# Patient Record
Sex: Female | Born: 1955 | Race: White | Hispanic: No | Marital: Married
Health system: Midwestern US, Community
[De-identification: ages and names within clinical notes are randomized; demographics above are authoritative.]

## PROBLEM LIST (undated history)

## (undated) DIAGNOSIS — K821 Hydrops of gallbladder: Principal | ICD-10-CM

## (undated) DIAGNOSIS — M7989 Other specified soft tissue disorders: Secondary | ICD-10-CM

## (undated) DIAGNOSIS — Z5181 Encounter for therapeutic drug level monitoring: Secondary | ICD-10-CM

## (undated) DIAGNOSIS — M4802 Spinal stenosis, cervical region: Secondary | ICD-10-CM

## (undated) DIAGNOSIS — G8929 Other chronic pain: Secondary | ICD-10-CM

## (undated) DIAGNOSIS — I10 Essential (primary) hypertension: Secondary | ICD-10-CM

## (undated) DIAGNOSIS — F22 Delusional disorders: Secondary | ICD-10-CM

## (undated) DIAGNOSIS — R928 Other abnormal and inconclusive findings on diagnostic imaging of breast: Secondary | ICD-10-CM

## (undated) DIAGNOSIS — R748 Abnormal levels of other serum enzymes: Secondary | ICD-10-CM

## (undated) DIAGNOSIS — R7989 Other specified abnormal findings of blood chemistry: Secondary | ICD-10-CM

## (undated) DIAGNOSIS — M2550 Pain in unspecified joint: Secondary | ICD-10-CM

## (undated) DIAGNOSIS — K811 Chronic cholecystitis: Secondary | ICD-10-CM

## (undated) DIAGNOSIS — M47816 Spondylosis without myelopathy or radiculopathy, lumbar region: Secondary | ICD-10-CM

## (undated) DIAGNOSIS — L989 Disorder of the skin and subcutaneous tissue, unspecified: Secondary | ICD-10-CM

## (undated) DIAGNOSIS — R609 Edema, unspecified: Secondary | ICD-10-CM

## (undated) DIAGNOSIS — M961 Postlaminectomy syndrome, not elsewhere classified: Secondary | ICD-10-CM

## (undated) DIAGNOSIS — K2970 Gastritis, unspecified, without bleeding: Secondary | ICD-10-CM

## (undated) DIAGNOSIS — B351 Tinea unguium: Secondary | ICD-10-CM

## (undated) DIAGNOSIS — K219 Gastro-esophageal reflux disease without esophagitis: Secondary | ICD-10-CM

## (undated) DIAGNOSIS — M4712 Other spondylosis with myelopathy, cervical region: Secondary | ICD-10-CM

## (undated) DIAGNOSIS — M797 Fibromyalgia: Secondary | ICD-10-CM

## (undated) DIAGNOSIS — F419 Anxiety disorder, unspecified: Secondary | ICD-10-CM

## (undated) DIAGNOSIS — E039 Hypothyroidism, unspecified: Secondary | ICD-10-CM

## (undated) DIAGNOSIS — F32A Depression, unspecified: Secondary | ICD-10-CM

## (undated) DIAGNOSIS — M5136 Other intervertebral disc degeneration, lumbar region: Secondary | ICD-10-CM

## (undated) DIAGNOSIS — M533 Sacrococcygeal disorders, not elsewhere classified: Secondary | ICD-10-CM

## (undated) DIAGNOSIS — M1990 Unspecified osteoarthritis, unspecified site: Secondary | ICD-10-CM

## (undated) DIAGNOSIS — M5020 Other cervical disc displacement, unspecified cervical region: Secondary | ICD-10-CM

## (undated) DIAGNOSIS — D649 Anemia, unspecified: Secondary | ICD-10-CM

## (undated) DIAGNOSIS — M47817 Spondylosis without myelopathy or radiculopathy, lumbosacral region: Secondary | ICD-10-CM

## (undated) DIAGNOSIS — M4716 Other spondylosis with myelopathy, lumbar region: Secondary | ICD-10-CM

## (undated) DIAGNOSIS — G4730 Sleep apnea, unspecified: Secondary | ICD-10-CM

## (undated) DIAGNOSIS — R011 Cardiac murmur, unspecified: Secondary | ICD-10-CM

## (undated) DIAGNOSIS — M5417 Radiculopathy, lumbosacral region: Secondary | ICD-10-CM

## (undated) DIAGNOSIS — Z79899 Other long term (current) drug therapy: Secondary | ICD-10-CM

## (undated) DIAGNOSIS — M810 Age-related osteoporosis without current pathological fracture: Secondary | ICD-10-CM

## (undated) DIAGNOSIS — E559 Vitamin D deficiency, unspecified: Secondary | ICD-10-CM

## (undated) DIAGNOSIS — R0600 Dyspnea, unspecified: Secondary | ICD-10-CM

## (undated) DIAGNOSIS — M51369 Other intervertebral disc degeneration, lumbar region without mention of lumbar back pain or lower extremity pain: Secondary | ICD-10-CM

## (undated) DIAGNOSIS — G63 Polyneuropathy in diseases classified elsewhere: Secondary | ICD-10-CM

## (undated) DIAGNOSIS — M4310 Spondylolisthesis, site unspecified: Secondary | ICD-10-CM

## (undated) DIAGNOSIS — F329 Major depressive disorder, single episode, unspecified: Secondary | ICD-10-CM

## (undated) HISTORY — DX: Depression, unspecified: F32.A

## (undated) HISTORY — DX: Age-related osteoporosis without current pathological fracture: M81.0

## (undated) HISTORY — PX: TONSILLECTOMY: SUR1361

## (undated) HISTORY — PX: APPENDECTOMY: SHX54

## (undated) HISTORY — DX: Unspecified osteoarthritis, unspecified site: M19.90

## (undated) HISTORY — DX: Essential (primary) hypertension: I10

## (undated) HISTORY — DX: Anxiety disorder, unspecified: F41.9

## (undated) HISTORY — PX: CHOLECYSTECTOMY: SHX55

## (undated) HISTORY — PX: TEMPORAL ARTERY BIOPSY / LIGATION: SUR132

## (undated) HISTORY — DX: Fibromyalgia: M79.7

## (undated) HISTORY — DX: Major depressive disorder, single episode, unspecified: F32.9

## (undated) MED ORDER — VENLAFAXINE 37.5 MG TAB
37.5 mg | ORAL_TABLET | ORAL | Status: DC
Start: ? — End: 2013-10-20

## (undated) MED ORDER — FUROSEMIDE 20 MG TAB
20 mg | ORAL_TABLET | ORAL | Status: DC
Start: ? — End: 2012-10-01

## (undated) MED ORDER — NEXIUM 40 MG CAPSULE,DELAYED RELEASE
40 mg | ORAL_CAPSULE | ORAL | Status: DC
Start: ? — End: 2013-12-18

## (undated) MED ORDER — TRAZODONE 100 MG TAB
100 mg | ORAL_TABLET | Freq: Every evening | ORAL | Status: DC
Start: ? — End: 2012-07-26

## (undated) MED ORDER — FUROSEMIDE 20 MG TAB
20 mg | ORAL_TABLET | Freq: Two times a day (BID) | ORAL | Status: DC
Start: ? — End: 2013-04-23

## (undated) MED ORDER — PREGABALIN 100 MG CAP
100 mg | ORAL_CAPSULE | ORAL | Status: DC
Start: ? — End: 2014-06-26

## (undated) MED ORDER — TRAZODONE 100 MG TAB
100 mg | ORAL_TABLET | Freq: Every evening | ORAL | Status: DC
Start: ? — End: 2012-05-11

## (undated) MED ORDER — VENLAFAXINE 37.5 MG TAB
37.5 mg | ORAL_TABLET | Freq: Two times a day (BID) | ORAL | Status: DC
Start: ? — End: 2013-04-26

## (undated) MED ORDER — FLUTICASONE 50 MCG/ACTUATION NASAL SPRAY, SUSP
50 mcg/actuation | Freq: Every day | NASAL | Status: DC
Start: ? — End: 2013-10-22

## (undated) MED ORDER — FUROSEMIDE 20 MG TAB
20 mg | ORAL_TABLET | ORAL | Status: DC
Start: ? — End: 2012-11-30

## (undated) MED ORDER — FUROSEMIDE 20 MG TAB
20 mg | ORAL_TABLET | Freq: Two times a day (BID) | ORAL | Status: DC
Start: ? — End: 2012-08-03

## (undated) MED ORDER — VENLAFAXINE 37.5 MG TAB
37.5 mg | ORAL_TABLET | ORAL | Status: DC
Start: ? — End: 2013-05-24

## (undated) MED ORDER — AMOXICILLIN-CLAVULANATE 500 MG-125 MG TAB
500-125 mg | ORAL_TABLET | Freq: Two times a day (BID) | ORAL | Status: AC
Start: ? — End: 2012-02-24

## (undated) MED ORDER — KETOCONAZOLE 2 % TOPICAL CREAM
2 % | CUTANEOUS | Status: DC
Start: ? — End: 2012-08-05

## (undated) MED ORDER — TRIMETHOPRIM-SULFAMETHOXAZOLE 160 MG-800 MG TAB
160-800 mg | ORAL_TABLET | Freq: Two times a day (BID) | ORAL | Status: AC
Start: ? — End: 2012-06-09

## (undated) MED ORDER — FUROSEMIDE 20 MG TAB
20 mg | ORAL_TABLET | Freq: Two times a day (BID) | ORAL | Status: DC
Start: ? — End: 2012-09-02

## (undated) MED ORDER — POTASSIUM CHLORIDE SR 8 MEQ TAB
8 mEq | ORAL_TABLET | Freq: Two times a day (BID) | ORAL | Status: DC
Start: ? — End: 2014-02-01

## (undated) MED ORDER — FUROSEMIDE 20 MG TAB
20 mg | ORAL_TABLET | ORAL | Status: DC
Start: ? — End: 2013-06-24

## (undated) MED ORDER — NEXIUM 40 MG CAPSULE,DELAYED RELEASE
40 mg | ORAL_CAPSULE | ORAL | Status: DC
Start: ? — End: 2013-05-24

## (undated) MED ORDER — ESOMEPRAZOLE MAGNESIUM 40 MG CAP, DELAYED RELEASE
40 mg | ORAL_CAPSULE | Freq: Every day | ORAL | Status: DC
Start: ? — End: 2013-04-26

## (undated) MED ORDER — FUROSEMIDE 20 MG TAB
20 mg | ORAL_TABLET | ORAL | Status: DC
Start: ? — End: 2012-10-31

---

## 1998-04-28 HISTORY — PX: ABDOMINAL HYSTERECTOMY: SHX81

## 2000-04-28 HISTORY — PX: OTHER SURGICAL HISTORY: SHX169

## 2002-04-28 HISTORY — PX: GASTRIC BYPASS: SHX52

## 2003-04-29 HISTORY — PX: BREAST BIOPSY: SHX20

## 2003-04-29 HISTORY — PX: JOINT REPLACEMENT: SHX530

## 2006-04-28 HISTORY — PX: SHOULDER ARTHROSCOPY WITH ROTATOR CUFF REPAIR: SHX5685

## 2006-12-14 NOTE — Op Note (Signed)
[facility]                                OPERATION REPORT                         SURGEON:  Phil Dopp, M.D.   [facility] Gavin Pound, Jackqueline   E:   MR  [id number]                DATE OF SURGERY:                     12/14/2006   #:   [place] Magnus  [id number]             PT. LOCATION:                        OR  OR23   #   Phil Dopp, M.D.        DOB: 1955/06/29        AGE:51        SEX:  F   cc:    Phil Dopp, M.D.   cc:  [facility]          [address]          [place], [place] [address]   PREOPERATIVE DIAGNOSIS:   Recurrent tear right rotator cuff.   POSTOPERATIVE DIAGNOSIS:   Large retracted recurrent tear rotator cuff.   TITLE OF PROCEDURES:      1. Exploration repair of right rotator cuff tear.      2. Release of coracoacromial ligament/acromioplasty.      3. Distal clavicle resection.   SURGEON:   Hillary Bow, M.D.   ASSISTANT:   Doyce Para.   ANESTHESIA:   Regional, Dr. San Morelle, [place].   PROCEDURE NOTE:  Past history:  The patient is a 51 year old female having   had 2 previous right rotator cuff repair attempts done in [place]   over the last 4 to 5 years.   The patient now comes desiring repair of a recurrent tear proven by MRI   scan.   DESCRIPTION OF PROCEDURE:  The patient was taken to the operating room and   after the induction of general anesthesia was placed in a semi-sitting   barber-chair position, with the right shoulder prepped and draped in a   routine fashion for shoulder procedure.   It should be noted that there was a previous scar over the anterior lateral   corner of the shoulder.  The skin incision was made straight superior in   saber cut fashion however just adjacent to the [facility]- joint.  The subcutaneous   tissue was divided down to the fascia overlying the deltoid.  The deltoid   was taken down off the anterior acromion in a straight horizontal fashion.   The [facility]- joint was dissected and the distal clavicle found to be    hypertrophic and degenerative.  Then, 4-mm to 6-mm of bone was resected   with sagittal saw.  The anterior acromion was dissected and revision of   acromioplasty was carried out removing the anteroinferior surface.  This   was made smooth with a rasp.  The coracoacromial ligament was identified   and released.   The subacromial space was then explored and indeed a large massive rotator   cuff tear  was found which was torn in at least 2 segments and retracted   distally.   It should be noted that this was the third procedure done on this patient's   shoulder which complicated the situations scar tissue and retraction of the   previously repaired tendon, made the procedure much more difficult.  This   required extensive effort intones to dissect the tendon free and mobilize   this out to the anterolateral corner.   Once the tendon was dissected free the far posterior corner was then   repaired in a side-to-side fashion using #2 FiberWire.  This closed a major   portion of the tear and allowed mobilization out to the anterior tuberosity   region.  Once this was accomplished a single Arthrex Bio-corkscrew suture   anchor was placed in the greater tuberosity.  This contained 4 sutures   which were brought through the tendon, attached securely and then brought   back through bone in a vest-over-pants double row type repair.  This   essentially produced a watertight closure with some deficit at the extreme   corner of the repair.  This was made watertight with recession of the   tendon however approximately 0.50 cm.   Once this was completed, the deltoid was then repaired back to the anterior   and lateral acromion with #1 Ethibond.  It should be noted that a Hemovac   drain was placed in the subacromial space prior to deltoid closure.  The   [facility]- joint was likewise closed with anterior deltoid fascia and posterior   trapezius fascia.  This was oversewed with 0 Vicryl and a watertight    closure obtained.  The ___ tissue was closed with 2-0 and 3-0 Vicryl.  The   skin was closed with 3-0 Prolene subcuticular.  Sterile dressings were   placed.   The patient was then placed in a sitting position with abduction orthosis   applied with the shoulder in 45 degrees of abduction, 10 degrees of forward   flexion, and 10 degrees of internal rotation.  The patient was transferred   to the recovery room in stable condition.  There were no complications.   Estimated blood loss was 15cc to 20 cc.  All sponge and instrument counts   were correct.   Electronically Signed By:   Phil Dopp, M.D. 01/01/2007 16:40   _________________________________   Phil Dopp, M.D.   TS1  D:  12/14/2006  T:  12/14/2006  3:16 P   [id number]

## 2006-12-14 NOTE — Op Note (Signed)
[facility]                                OPERATION REPORT                         SURGEON:  Bridget Morton, M.D.   [facility] Bridget Morton, Bridget Morton   E:   MR  [id number]                DATE OF SURGERY:                     12/14/2006   #:   Bridget Morton  [id number]             PT. LOCATION:                        OR  OR23   #   Bridget Morton, M.D.        DOB: 1955-10-12        AGE:51        SEX:  F   cc:    Bridget Morton, M.D.   cc:  [facility]          [address]          [place], [place] [address]   PREOPERATIVE DIAGNOSIS:   Recurrent tear right rotator cuff.   POSTOPERATIVE DIAGNOSIS:   Large retracted recurrent tear rotator cuff.   TITLE OF PROCEDURES:      1. Exploration repair of right rotator cuff tear.      2. Release of coracoacromial ligament/acromioplasty.      3. Distal clavicle resection.   SURGEON:   Hillary Bow, M.D.   ASSISTANT:   Doyce Para.   ANESTHESIA:   Regional, Dr. San Morelle, [place].   PROCEDURE NOTE:  Past history:  The patient is a 51 year old female having   had 2 previous right rotator cuff repair attempts done in [place]   over the last 4 to 5 years.   The patient now comes desiring repair of a recurrent tear proven by MRI   scan.   DESCRIPTION OF PROCEDURE:  The patient was taken to the operating room and   after the induction of general anesthesia was placed in a semi-sitting   barber-chair position, with the right shoulder prepped and draped in a   routine fashion for shoulder procedure.   It should be noted that there was a previous scar over the anterior lateral   corner of the shoulder.  The skin incision was made straight superior in   saber cut fashion however just adjacent to the [facility]- joint.  The subcutaneous   tissue was divided down to the fascia overlying the deltoid.  The deltoid   was taken down off the anterior acromion in a straight horizontal fashion.   The [facility]- joint was dissected and the distal clavicle found to be   hypertrophic  and degenerative.  Then, 4-mm to 6-mm of bone was resected   with sagittal saw.  The anterior acromion was dissected and revision of   acromioplasty was carried out removing the anteroinferior surface.  This   was made smooth with a rasp.  The coracoacromial ligament was identified   and released.   The subacromial space was then explored and indeed a large massive rotator   cuff tear  was found which was torn in at least 2 segments and retracted   distally.   It should be noted that this was the third procedure done on this patient's   shoulder which complicated the situations scar tissue and retraction of the   previously repaired tendon, made the procedure much more difficult.  This   required extensive effort intones to dissect the tendon free and mobilize   this out to the anterolateral corner.   Once the tendon was dissected free the far posterior corner was then   repaired in a side-to-side fashion using #2 FiberWire.  This closed a major   portion of the tear and allowed mobilization out to the anterior tuberosity   region.  Once this was accomplished a single Arthrex Bio-corkscrew suture   anchor was placed in the greater tuberosity.  This contained 4 sutures   which were brought through the tendon, attached securely and then brought   back through bone in a vest-over-pants double row type repair.  This   essentially produced a watertight closure with some deficit at the extreme   corner of the repair.  This was made watertight with recession of the   tendon however approximately 0.50 cm.   Once this was completed, the deltoid was then repaired back to the anterior   and lateral acromion with #1 Ethibond.  It should be noted that a Hemovac   drain was placed in the subacromial space prior to deltoid closure.  The   [facility]- joint was likewise closed with anterior deltoid fascia and posterior   trapezius fascia.  This was oversewed with 0 Vicryl and a watertight   closure obtained.  The ___ tissue was closed with  2-0 and 3-0 Vicryl.  The   skin was closed with 3-0 Prolene subcuticular.  Sterile dressings were   placed.   The patient was then placed in a sitting position with abduction orthosis   applied with the shoulder in 45 degrees of abduction, 10 degrees of forward   flexion, and 10 degrees of internal rotation.  The patient was transferred   to the recovery room in stable condition.  There were no complications.   Estimated blood loss was 15cc to 20 cc.  All sponge and instrument counts   were correct.   Electronically Signed By:   Bridget Morton, M.D. 01/01/2007 16:40   _________________________________   Bridget Morton, M.D.   TS1  D:  12/14/2006  T:  12/14/2006  3:16 P   [id number]

## 2008-08-23 NOTE — Procedures (Signed)
Test Reason : Pre-Op   Blood Pressure : ***/*** mmHG   Vent. Rate : 084 BPM     Atrial Rate : 084 BPM   P-R Int : 140 ms          QRS Dur : 074 ms   QT Int : 352 ms       P-R-T Axes : 031 053 017 degrees   QTc Int : 415 ms   Normal sinus rhythm   Normal ECG   When compared with ECG of 09-Dec-2006 15:03,   No significant change was found   Confirmed by Robertson, M.D., Scott ([id number]) on 08/23/2008 4:35:38 PM   Referred By:  MAUSER, THOMAS L           Overread By: Scott Robertson, M.D.

## 2008-08-23 NOTE — Procedures (Signed)
Test Reason : Pre-Op   Blood Pressure : ***/*** mmHG   Vent. Rate : 084 BPM     Atrial Rate : 084 BPM   P-R Int : 140 ms          QRS Dur : 074 ms   QT Int : 352 ms       P-R-T Axes : 031 053 017 degrees   QTc Int : 415 ms   Normal sinus rhythm   Normal ECG   When compared with ECG of 09-Dec-2006 15:03,   No significant change was found   Confirmed by Merilynn Finland, M.D., Scott ([id number]) on 08/23/2008 4:35:38 PM   Referred By:  Blenda Bridegroom           Overread By: Marcello Moores, M.D.

## 2010-03-18 NOTE — Progress Notes (Signed)
[facility]   Physical Therapy   PATIENT NAME:  Bridget Morton, Bridget Morton   MR#    [id number]   DATE OF BIRTH: 06-05-1955   DATE: 03/13/2010   LOCATION:    [facility]  [id number]   REF. PHYSICIAN: Marylynn Pearson               REFERRING PHYSICIAN:    Dr. Marylynn Pearson       DATABASE:   The patient is a 54 year old female who was referred to physical therapy with    a diagnosis of postural arthritis 1 to 3 visits for completion of her    disability form.       SUBJECTIVE:       HISTORY OF PRESENT ILLNESS:   The patient reported longstanding history of osteoarthritis.  She had    bilateral knee replacement, 3 rotator cuff repairs and spinal disk fusion.     She has pain and weakness in her legs and upper arm.  She had difficulty    walking.       MEDICATIONS:   Gabapentin, omeprazole, Hyzaar, HCTZ, Celebrex, Fentanyl patch, oxycodone.         ALLERGIES:   NO KNOWN DRUG ALLERGIES.       PRECAUTIONS:   None stated in the referral form.       PAST MEDICAL HISTORY:   Hypertension, _____ disease and osteoporosis.       X-RAYS/TESTS:       OCCUPATIONAL/SOCIAL HISTORY:   The patient is currently unemployed.  She used to work Sports coach    work; however, she has stopped since unable to fulfill job requirements.  She    enjoys cooking, reading and gardening.       PREVIOUS PHYSICAL THERAPY:   None.       PATIENT'S GOAL:       OBJECTIVE:       RANGE OF MOTION:   (See attached sheet for lower extremity range of motion and bilateral hands    and fingers range of motion.        MANUAL MUSCLE TESTS:   Bilateral upper extremities 4/5, bilateral lower extremities 4-/5.       PALPATION:   Not tested.       SKIN:   Intact as observed in both upper and lower extremities.       POSTURE:   The patient was sitting in flexed position.       SENSATION:   Intact.       COORDINATION, BALANCE AND GAIT:   After sit to stand patient gaits with decreased cadence.       SPECIAL TESTS:   Not tested.       TODAY'S TREATMENT:    Initial evaluation and measurements completed.       PHYSICAL THERAPY ASSESSMENT:   The patient presented with limited range of motion in certain joints of both    lower and upper extremities.       GOALS:   Complete form.       REHAB POTENTIAL:       BARRIERS TO ACHIEVING GOALS:       PLAN:   The patient was seen for one visit to complete paperwork for disability.           ___________________   Duard Brady MS,PT,DPT   Dictated By: .    Lovey Newcomer   D:03/18/2010   T: 03/19/2010 15:55:15   [id number]

## 2010-11-04 NOTE — ED Provider Notes (Signed)
KNOWN ALLERGIES   NKDA       TRIAGE Sheral Flow Nov 04, 2010 12:27 TJM0)   TRIAGE NOTES:  PMH: chronic pain w/arthritis, lipomas - Pt states         I feel like my body is on fire. It feels like a bee is stinging me         all over. Pt states pain has worsened over the wkend. Pt was         directed by PMD to come to ED for eval/tx. Sheral Flow Nov 04, 2010 12:27         TJM0)   PATIENT: NAME: Bridget Morton, Bridget Morton, AGE: 55, GENDER: female, DOB: [birth year]         October 09, 1955, TIME OF GREET: Mon Nov 04, 2010 12:18, LANGUAGE:         [place], [place]: [id number], KG WEIGHT: 91.6, HEIGHT: 162cm, MEDICAL         RECORD NUMBER: [id number], ACCOUNT NUMBER: [id number], PCP: Blenda Bridegroom,. [facility] Nov 04, 2010 12:27 TJM0)   ADMISSION: URGENCY: 3, DEPT: Emergency, BED: WAITING. Sheral Flow Nov 04, 2010 12:27 TJM0)   VITAL SIGNS: BP 153/74, (Sitting), Pulse 68, Resp 16, Temp 98.4,         (Oral), Pain 8, O2 Sat 99, on Room air, Time 11/04/2010 12:22. (12:22         TJM0)   COMPLAINT:  body aches/burning sensation. Sheral Flow Nov 04, 2010 12:27         TJM0)   PRESENTING COMPLAINT:  generalized body pains and buring         sensation for months. (12:52 KLH1)   PAIN: Patient complains of pain, Pain described as aching, Pain         described as burning, On a scale 0-10 patient rates pain as 8, Pain         is constant. (12:52 KLH1)   TB SCREENING: TB screen negative for this patient. (12:52         KLH1)   ABUSE SCREENING: Patient denies physical abuse or threats. (12:52         KLH1)   FALL RISK: Patient has a low risk of falling. (12:52 KLH1)   SUICIDAL IDEATION: Suicidal ideation is not present. (12:52         KLH1)   ADVANCE DIRECTIVES: Patient does not have advance directives,         Triage assessment performed. (12:52 KLH1)   PROVIDERS: TRIAGE NURSE: Theodis Sato, RN. Sheral Flow Nov 04, 2010         12:27 TJM0)       CURRENT MEDICATIONS   Cozaar:  50 mg Oral once a day. (12:27 TJM0)   Lasix:  20 mg Oral once a day. (12:28 TJM0)    Klor-Con 8:  ER 1 tab(s) Oral once a day. (12:28 TJM0)   Celebrex:  200 mg Oral 2 times a day. (12:28 TJM0)   Omeprazole:  20 mg Oral 2 times a day. (12:29 TJM0)   Percocet 10/325:  1 tab Oral 3 times a day. for chronic back         pain. (12:29 TJM0)   Duragesic-50:  1 patch Topical See Notes. ever 72 hrs. (12:29         TJM0)   Boniva:  150 mg Oral See Notes. once a month. (12:30 TJM0)   Zoloft:  100  mg Oral once a day. (12:30 TJM0)   Gabapentin:  300 mg Oral 3 times a day. (12:30 TJM0)       MEDICATION SERVICE   Morphine Sulfate:  Order: Morphine Sulfate - Dose: 8 mg         : IV         Ordered by: Sharma Covert, PA-C         Entered by: Sharma Covert, PA-C Mon Nov 04, 2010 13:14 ,          Acknowledged by: Kathlen Brunswick, RN Mon Nov 04, 2010 14:06         Documented as given by: Kathlen Brunswick, RN Mon Nov 04, 2010 14:15          Patient, Medication, Dose, Route and Time verified prior to         administration.          Time given: 1415, Amount given: 8 mg, IV site 1, Medication         administered into left AC, IVP, Initial medication, Slowly, Catheter         placement confirmed via flush prior to administration, IV site         without signs or symptoms of infiltration during medication         administration, No swelling during administration, No drainage during         administration, IV flushed after administration, Correct patient,         time, route, dose and medication confirmed prior to administration,         Patient advised of actions and side-effects prior to administration,         Allergies confirmed and medications reviewed prior to administration,         Patient in position of comfort, Side rails up, Cart in lowest         position, Family at bedside, Call light in reach.    : Follow Up : Time: 1519, On a scale 0-10 patient rates pain as         7. (15:19 KLH1)   Morphine Sulfate:  Order: Morphine Sulfate - Dose: 8 mg         : IV         Ordered by: Sharma Covert, PA-C          Entered by: Sharma Covert, PA-C Mon Nov 04, 2010 16:00 ,          Acknowledged by: Kathlen Brunswick, RN Mon Nov 04, 2010 16:03         Documented as given by: Kathlen Brunswick, RN Mon Nov 04, 2010 16:03          Patient, Medication, Dose, Route and Time verified prior to         administration.          Time given: 1603, Amount given: 8 mg, IV site 1, Medication         administered into left AC, IVP, Repeat same medication, Slowly,         Catheter placement confirmed via flush prior to administration, IV         site without signs or symptoms of infiltration during medication         administration, No swelling during administration, No drainage during         administration, IV flushed after administration, Correct patient,         time, route, dose and medication confirmed prior  to administration,         Patient advised of actions and side-effects prior to administration,         Allergies confirmed and medications reviewed prior to administration,         Patient in position of comfort, Side rails up, Cart in lowest         position, Call light in reach.   Zofran ODT:  Order: Zofran ODT (Ondansetron) - Dose: 4         mg : Oral         Ordered by: Sharma Covert, PA-C         Entered by: Sharma Covert, PA-C Mon Nov 04, 2010 13:14 ,          Acknowledged by: Kathlen Brunswick, RN Mon Nov 04, 2010 14:06         Documented as given by: Kathlen Brunswick, RN Mon Nov 04, 2010 14:15          Patient, Medication, Dose, Route and Time verified prior to         administration.          Time given: 1415, Amount given: 4 mg, Site: Medication administered         buccal, Correct patient, time, route, dose and medication confirmed         prior to administration, Patient advised of actions and side-effects         prior to administration, Allergies confirmed and medications reviewed         prior to administration, Patient in position of comfort, Side rails         up, Cart in lowest position, Family at bedside, Call light in reach.       ORDERS    BASIC METABOLIC PANEL:  Ordered for: Delton See, MD, Tim         Status: Done by System Mon Nov 04, 2010 14:11. (13:13 [facility])   Urine dip (send for lab U/A if positive):  Ordered for: Delton See,         MD, Tim         Status: Done by Argie Ramming, Dora Sims) Mon Nov 04, 2010 13:37.         (13:13 KMJ)   CBC, AUTOMATED DIFFERENTIAL:  Ordered for: Delton See, MD, Tim         Status: Done by System Mon Nov 04, 2010 13:57. (13:13 KMJ)   IV- Saline Lock:  Ordered for: Delton See, MD, Tim         Status: Done by Paulene Floor) Mon Nov 04, 2010 13:53.         (13:14 KMJ)   IV- Normal Saline 1 liter Bolus:  Ordered for: Delton See, MD, Tim         Status: Done by Vilma Prader RN, Mendel Ryder Nov 04, 2010 14:06. (13:14         KMJ)   Elita Boone IV Cath:  Ordered for: Delton See, MD, Tim         Status: Active. (16:13 KLH1)   Dial A Flow Tubing:  Ordered for: Delton See, MD, Tim         Status: Active. (16:13 KLH1)   IV Start kit:  Ordered for: Delton See, MD, Tim         Status: Active. (16:13 KLH1)       NURSING ASSESSMENT: ABDOMEN (12:55 KLH1)   CONSTITUTIONAL: Complex assessment performed, History obtained  from patient, Patient arrives, via hospital wheelchair, Gait steady,         Patient appears, anxious, in distress due to pain, Patient         cooperative, Patient alert, Oriented to person, place and time, Skin         warm, Skin dry, Skin normal in color, Mucous membranes pink, Mucous         membranes moist, Patient is well-groomed.   PAIN: burning pain, diffusely.   ABDOMEN: Abdomen soft, tender, diffusely, Bowel sound normal,         Notes: pt. stated she has diffuse sharp abd pain off and on x 1 wk,         they cannot find out why, Pt. stated she has multiple lipomas and         is in chronic pain. Pt. is worsening over time and they don't have         any reasons to why she has all the burning sensations.   NOTES: Notes: pt. stated due to her chronic arthritis and          lipomas, she has this burning all over her body from inside out and         all these lumps that hurt. She needs to find and MD that can help         with the chronic pain.   SAFETY: Side rails up, Cart/Stretcher in lowest position, Family         at bedside, Call light within reach, Hospital ID band on.       NURSING ASSESSMENT: NURSES NOTE   TIME ASSESSED: Patient in no apparent distress, Patient resting         quietly, Patient alert and oriented, breathing regular and unlabored,         skin warm and dry. (13:45 KLH1)      Patient in no apparent distress, Patient resting quietly, Patient alert         and oriented, breathing regular and unlabored, skin warm and dry.         (14:30 KLH1)      Patient in no apparent distress, Patient resting quietly, Patient alert         and oriented, breathing regular and unlabored, skin warm and dry.         (15:23 KLH1)       NURSING PROCEDURE: IV   PATIENT IDENITIFIER: Patient's identity verified by patient         stating name. (13:53 ECW0)     Patient's identity verified by patient stating name, Patient's identity         verified by patient stating birth date, Patient's identity verified         by hospital ID bracelet. (14:10 KLH1)   IV SITE 1: IV established, to the left antecubital, using a 20         gauge catheter, in one attempt, Flushed with normal saline (mls): 10,         Labs drawn at time of placement, labeled in the presence of the         patient and sent to lab. (13:53 ECW0)   FOLLOW-UP SITE 1: After procedure, sterile transparent dressing         applied. (13:53 ECW0)     After procedure, IV line connections checked and properly labeled, Notes:         no redness/drainage or swelling noted. (14:10  KLH1)   FLUIDS: Saline lock established, 0.9 normal saline 1 liter hung,         first bag, IV bolus of 1000 ml established, at a wide open rate, via         Dial-a-flow tubing. (14:10 KLH1)   SAFETY: Side rails up, Cart/Stretcher in lowest position, Family          at bedside, Call light within reach, Hospital ID band on. (13:53         ECW0)     Side rails up, Cart/Stretcher in lowest position, Call light within         reach, Hospital ID band on. (14:10 KLH1)       DIAGNOSIS (15:38 KMJ)   FINAL: PRIMARY: Chronic generalized pain.       DISPOSITION   PATIENT:  Disposition Type: Discharged, Disposition: Discharged,         Condition: Stable. (15:38 KMJ)      IV Infusion: N/A, Patient left the department. (17:55 JND5)       INSTRUCTION (15:39 KMJ)   DISCHARGE:  CHRONIC PAIN - [facility]).   FOLLOWUPBlenda Bridegroom, GMD, [address], [place]         [place] [address], [phone].   SPECIAL:  Follow up with primary care physician.         Return to the ER if condition worsens or new symptoms develop.   Key:     ECW0=Worman, ACT III, Dora Sims)  JND5=Duff, RN, Edwena Felty  KLH1=Heath,     RN, Clydie Braun     KMJ=Jones, PA-C, Diannia Ruder  TJM0=Miller, RN, [employer]

## 2010-11-04 NOTE — ED Provider Notes (Signed)
[facility]   EMERGENCY DEPARTMENT TREATMENT REPORT   NAME:  Bridget Morton   SEX:   F   ADMIT: 11/04/2010   DOB:   1955/10/25   MR#    [id number]   ROOM:     TIME SEEN: 02 15 PM   ACCT#  [id number]       cc: Caralyn Guile DO, Guss Bunde MDMPH       PRIMARY CARE PHYSICIAN:   Dr. Caralyn Guile       PAIN MANAGEMENT:   Dr. Buford Dresser       TIME OF EVALUATION:   1250        CHIEF COMPLAINT:   Body aches, burning sensation.       HISTORY OF PRESENT ILLNESS:   A 55 year old female with chronic pain presents for evaluation of generalized    burning sensation throughout her entire body.  She states that it feels like    her body is on fire and it has been worse over the past week, but this    chronic pain has been going on for quite some period of time.  She tells me    she has multiple lipomas and they are very painful as well.  She describes her    pain as an 8 out of 10.  She took oxycodone 10 mg prior to arrival, but that    did not alleviate this pain.  She complains of left-sided chest wall pain, a    frontal headache that has been ongoing for the past month that she has been    treated for sinusitis with Levaquin and a Z-Pak.  Her arms feel weak.  Her    left hip hurts.  Her bilateral lower extremities hurt.  She has been seen by    multiple physicians for this problem and she tells me that she has been unable    to find out the reason why she has generalized pain all the time.       REVIEW OF SYSTEMS:   CONSTITUTIONAL:  No fever.  Positive for 10 pound weight gain in the past 2    weeks.   ENT:  No sore throat.   RESPIRATORY:  Positive for shortness of breath.   CARDIOVASCULAR:  For chest pain.    GASTROINTESTINAL:  No abdominal pain.  No nausea, vomiting or diarrhea.   GENITOURINARY:  No dysuria.   MUSCULOSKELETAL:  Positive for left hip pain.  Positive for bilateral shoulder    pain.  Positive for back pain.   NEUROLOGIC:  Positive for headache.   INTEGUMENTARY:  No open lesions.        PAST MEDICAL HISTORY:   Bilateral knee replacement, shoulder surgery, back surgery, osteoarthritis,    gastric bypass, C-section, appendectomy, tonsillectomy, anxiety, depression,    panic attacks.       MEDICATIONS:   Multiple and reviewed in Ibex.       ALLERGIES:   NONE.       SOCIAL HISTORY:   Nonsmoker.       FAMILY HISTORY:   Unrelated.       PHYSICAL EXAMINATION:   VITAL SIGNS:  Blood pressure 153/74, pulse 68, respiratory rate 16,    temperature 98.4, O2 saturation is 99% on room air, pain is an 8 out of 10.   GENERAL:  A well-developed, well-nourished 55 year old female resting    comfortably, nontoxic, alert and oriented.   HEENT:  Mouth/Throat:  Surfaces of the pharynx, palate,  and tongue are pink,    moist, and without lesions.  Examination of the nose is unremarkable.   LYMPHATICS:  No cervical or submandibular lymphadenopathy palpated.     RESPIRATORY:  Clear and equal breath sounds bilaterally.  No respiratory    distress.   HEART:   Regular rate and rhythm.  No murmurs, rubs or gallops are appreciated    on exam.   CHEST:  Symmetrical.  She exhibits tenderness diffusely throughout her entire    chest wall to palpation.   GASTROINTESTINAL:  The abdomen is soft.  She complains of pain throughout her    entire abdomen with palpation, but denies pain otherwise.   EXTREMITIES:  The patient has pain in her upper and lower extremities with    light palpation as well as along her spine.  She does have a midline lumbar    spine scar visible on exam.   PSYCHIATRIC:  The patient appears anxious.  She is alert and oriented.     Judgment is appropriate.       CONTINUATION BY:  Sharma Covert, PA-C        INITIAL ASSESSMENT AND MANAGEMENT PLAN:   This is a 55 year old female who presents for evaluation of generalized body    pain.  She has had chronic pain issues in the past.  She tells me she has been    to multiple doctors.  No one has been able to figure out what is wrong with     her, and she would like [place] to figure it out today.  I have counseled her that    we will most likely not find out the cause of her chronic pain.  She has had    numerous surgeries in the past.  She has lipomas which contribute to chronic    pain and that her coordination of these chronic problems  will need to be    managed by her primary care physician.  We will obtain some basic labs today,    treat the patient's pain to further assess her.         ED DIAGNOSTICS:   CBC showed a hemoglobin and hematocrit slightly low at 11.3 hemoglobin,    hematocrit 34.1, otherwise unremarkable.  BMP is unremarkable.  Point of care    UA is all within normal limits.           COURSE IN THE EMERGENCY DEPARTMENT:   The patient was given 8 mg of morphine IV as well as 4 mg of Zofran ODT.  The    morphine dose was repeated.       FINAL DIAGNOSIS:   Chronic generalized pain.       DISPOSITION AND PLAN:   The patient is discharged home in stable condition.  She is advised to follow    up with Dr. Emmit Alexanders, her primary care physician.  She already has oxycodone    and fentanyl at home.  I have counseled her that we are not able to provide    her with narcotic pain medications at this time given that she is in pain    management and has sufficient narcotics at home to treat her pain.  She is    advised to follow up with her primary care physician and return to our    facility if new or worsening symptoms present themselves.         The patient was personally evaluated by myself and Dr. Delton See who  agrees with    the above assessment and plan.           ___________________   Gwenyth Allegra MD   Dictated By: Sheppard Penton, PA-C   jm   D:11/04/2010   T: 11/04/2010 15:55:50   [id number]

## 2011-01-01 NOTE — Procedures (Signed)
Study ID: [id number]                                                      [facility]                                                      [address]                                                       [place], [place] [address]                            Exercise Stress Echocardiogram Report           Name: [name], Bridget Morton             Study Date: 01/02/2011 12:27 PM   MRN: [id number]                       Patient Location: ER^ER14^ER14^C   DOB: 07/07/1955                   Age: 55 yrs   BP: 127/63 mmHg                   HR: 77   Gender: Female                    Account #: [id number]   Reason For Study: CHEST PAIN   Ordering Physician: WOODARD, LEE   Performed By: Lawhon, Alicia N       Interpretation Summary   The Electrocardiographic Interpretation: negative by ECG criteria.   The Echocardiographic Interpretation: hyperkinetic wall motion.   The OverAll Impression : negative for ischemia with low likelihood for CAD.       Stress Results              Protocol:  Bruce              Target HR: 141 bpm         Maximum Predicted HR: 166 bpm                          Stress Duration:   3:51 mm:ss *                      Maximum Stress HR: 151 bpm *       Stress Comments   A treadmill exercise test according to Bruce protocol was performed. The   baseline ECG displays normal sinus rhythm. There no ST segment changes during   stress. Arrhythmia induced during stress: occasional PAC's. Test was   terminated due to target heart rate was achieved. Patient had no chest pain.   B/P Response to exercise: Resting hypertension - appropriate response.           I        WMSI = 1.00     % Normal = 100                                                                   REST                                                                   II      WMSI = 1.00     % Normal = 100                                                                   PEAK                                                                                                                                Segments  Size   X - Cannot                2 -                      4 -          1-2     small   Interpret    1 - Normal   Hypokinetic 3 - Akinetic Dyskinetic   3-5     moder   ate   5 -                                                             6-14    large   Aneurysmal                                                      15-16   diffu   se               Left Ventricle   The left ventricular chamber size   at rest is normal. The left ventricular   chamber size at peak stress is smaller.           _____________________________________________________________________________   __           Electronically signed byDr. Jesse W. St Clair III, MD   01/02/2011 01:24                           PM

## 2011-01-01 NOTE — Procedures (Signed)
Test Reason : Chest pain   Blood Pressure : ***/*** mmHG   Vent. Rate : 079 BPM     Atrial Rate : 079 BPM      P-R Int : 182 ms          QRS Dur : 076 ms       QT Int : 362 ms       P-R-T Axes : 046 000 023 degrees      QTc Int : 415 ms   Normal sinus rhythm   Minimal voltage criteria for LVH, may be normal variant   Otherwise normal ECG   When compared with ECG of 23-Aug-2008 12:05,   No significant change was found   Confirmed by Miller, M.D., Edward ([id number]) on 01/02/2011 11:21:50 AM   Referred By:             Overread By: Edward Miller, M.D.

## 2011-01-01 NOTE — ED Provider Notes (Signed)
[facility]   EMERGENCY DEPARTMENT TREATMENT REPORT   NAME:  Bridget Morton   SEX:   F   ADMIT: 01/01/2011   DOB:   1955/08/27   MR#    [id number]   ROOM:     TIME SEEN: 02 32 AM   ACCT#  [id number]                   PRIMARY CARE PHYSICIAN:   Dr. Emmit Alexanders.       EMERGENCY ROOM PHYSICIAN:    Dr. Candis Shine.       CHIEF COMPLAINT:   Chest pain.       HISTORY OF PRESENT ILLNESS:   A 55 year old female who began to experience chest pain which she described as    midsternal and sharp while she was sitting at home emailing today.  It has    been constant.  She feels short of breath.  When the episode occurred, she    felt flushed.  She had some diaphoresis and nausea that lasted for    approximately 10 to 15 minutes and has now become less discomforting.  She    took 4 aspirin prior to her arrival to the Emergency Department.  She admits    to having chronic gastrointestinal issues and constant belly pain.  She is due    to go for any endoscopy in a couple of weeks.  She had a gastric bypass 8    years and has been having issues ever since.  She denies any other alleviating    or aggravating factors associated with her condition.       REVIEW OF SYSTEMS:   CONSTITUTIONAL:  No fever, chills, or weight loss.    EYES:  No visual symptoms.    ENT:  No sore throat, runny nose, or other URI symptoms.    RESPIRATORY:  Denies cough.  Admits to shortness of breath.   CARDIOVASCULAR:  Chest pain.   GASTROINTESTINAL:  Admits to abdominal pain.  Denies vomiting or diarrhea.   GENITOURINARY:  No dysuria, frequency, or urgency.    MUSCULOSKELETAL:  No joint pain or swelling.    INTEGUMENTARY:  No rashes.    NEUROLOGICAL:  No headaches, sensory or motor symptoms.       PAST MEDICAL HISTORY:   Chronic arthritis under pain management, Crohn's disease, joint pain,    hypertension, right rotator cuff repair, bilateral knee replacement,    C-section, gastric bypass, appendectomy, tonsillectomy, anxiety, depression.        SOCIAL HISTORY:   Admits to previous tobacco use.  Denies alcohol or drug use.       FAMILY HISTORY:   Noncontributory for coronary artery disease.       CURRENT MEDICATIONS:   Listed and reviewed in Ibex.       ALLERGIES:   NO KNOWN DRUG ALLERGIES.       PHYSICAL EXAMINATION:   VITAL SIGNS:  Blood pressure 117/71, pulse 81, respirations 12, temperature is    98.2, pain is 2 out 10, O2 saturations 100% on room air.   GENERAL APPEARANCE:  Patient appears well developed and well nourished.     Appearance and behavior are age and situation appropriate.    EYES:  Conjunctivae clear, lids normal.  Pupils equal, symmetrical, and    normally reactive.    ENT:  Mouth/Throat:  Surfaces of the pharynx, palate, and tongue are pink,    moist, and without lesions.    NECK:  Supple, nontender, symmetrical, no masses or JVD, trachea midline,    thyroid not enlarged, nodular, or tender.    LYMPHATIC:  No cervical or submandibular lymphadenopathy palpated.    RESPIRATORY:  Clear and equal breath sounds.  No respiratory distress,    tachypnea, or accessory muscle use.      CARDIOVASCULAR:   Heart regular, without murmurs, gallops, rubs, or thrills.      No peripheral edema or significant varicosities.    CHEST:  Chest symmetrical without masses or tenderness.    VASCULAR:  Calves are soft and nontender.     GASTROINTESTINAL:  The abdomen is soft, tender to palpation all 4 quadrants.     MUSCULOSKELETAL:  Stance and gait appear normal.    SKIN:  Warm and dry without rashes.       CONTINUATION BY DR. Christiane Ha ROMASH:       The patient was seen and examined with PN Nichole Rice.  I agree with her    history and physical examination.        HISTORY OF PRESENT ILLNESS:  Bridget Morton is a 55 year old female with a history of    Dercum disease, chronic arthritis.       PAST MEDICAL HISTORY:  Here with chest pain, nausea, diaphoresis episode that    lasted about 15 minutes today.  Results at this time, EKG shows normal sinus     rhythm with no ST elevations or depressions or any evidence of ischemia.     Chest x-ray shows no acute pulmonary process as read by Dr. Jolayne Panther.     Cardiac enzymes are negative.  Basic metabolic panel  is unremarkable.  CBC is    only notable for hemoglobin 11.  Liver function tests are normal.  D-dimer    mildly elevated at 0.65.       HOSPITAL COURSE:   While in the Emergency Department, the patient remained chest pain free, had    no nausea, no vomiting, no diaphoresis.  We had an extensive discussion    regarding the possibility of her chest pain being thromboembolic in nature and    that we should go forward  with a CT angiogram.  As she is in agreeance, we    will want the CT angio.  CT angio was performed.  At this point, the results    are pending.  The patient continues to be chest pain free, has  no nausea, has    no vomiting, has no back pain, has no abdominal pain.  If CT is negative,    plan at this point is to disposition patient to the chest pain observation    unit for rule out and stress test in the morning.  If positive, the patient    will require admission and IV heparin .       CONTINUATION BY Wynelle Bourgeois, MD       CT angiogram has returned at this time.  CT is negative for pulmonary embolus.     The patient continues to be chest pain free, for this reason, we will admit    the patient to the chest observation unit for further cardiac workup.       DISPOSITION:   Emergency Department Chest Pain Unit.       ADMITTING DIAGNOSIS:   Chest pain.             ___________________   Wynelle Bourgeois MD   Dictated By: Sheppard Penton,  PA-C   RA   D:01/02/2011   T: 01/02/2011 02:56:33   [id number]

## 2011-01-01 NOTE — Discharge Summary (Signed)
[facility]   ED Discharge Summary   NAME:  Bridget Morton, Bridget Morton   SEX:   F   DOB: 05-Mar-1956   MR#    [id number]   ROOM:     ACCT#  [id number]               DATE AND TIME OF ADMISSION TO OBSERVATION:   01/02/2011 at 0251       DATE AND TIME OF DISCHARGE FROM OBSERVATION:   01/02/2011 at 1433       HISTORY OF PRESENT ILLNESS:   The patient was seen in the Emergency Department for evaluation of chest pain.     The evaluation was unremarkable and subsequently the patient was assigned to    observation under chest pain protocol.         PHYSICAL EXAMINATION:   VITAL SIGNS:  Blood pressure 132/70, pulse of 84, respirations 22, temperature    98.5, O2 sats 98% on room air, pain scale 0 out of 10.   GENERAL APPEARANCE:  Patient appears well developed and well nourished.     Appearance and behavior are age and situation appropriate. The patient is    eating, sitting up, resting comfortably upon my arrival, husband is present in    room.   RESPIRATORY:  Clear and equal breath sounds.  No respiratory distress,    tachypnea, or accessory muscle use.      CARDIOVASCULAR:   Heart regular, without murmurs, gallops, rubs, or thrills.         ABDOMEN:  Soft, nontender to palpation in all quadrants.  Calves are soft and    nontender.   MUSCULOSKELETAL:  The patient has 2+ pitting edema to the lower extremities.       COURSE IN THE EMERGENCY DEPARTMENT OBSERVATION UNIT:   Course in Observation:  The patient remained pain free and did not develop    other symptoms.  The cardiac enzymes were negative and the patient underwent    an EST, the results of which were ____.   Cardiac enzymes negative times 3.     D-dimer was elevated at 0.65.  The patient went in for a CTA of the chest    which showed no pulmonary embolism but a small 2 to 3 mm right upper lobe    nodule.  Discussed this with the patient.  She states that she is aware this    is not a new finding, Dr. Emmit Alexanders has been following her for this.  Exercise     stress test read as an adequate study and normal.  Echocardiogram resting was    read as normal with exercising was normal and negative for any ischemia.  The    patient at this time has no questions or concerns and is ready to go home and    followup with her primary care physician.  The patient requests another    referral to a different primary care physician at this time as well.       CLINICAL IMPRESSION:   It was discussed with the patient that their cardiac evaluation was    unremarkable and does not indicate that immediate intervention is necessary.    The patient was counseled that the testing is not 100 percent accurate and may    generate false negatives.  The patient is eating and drinking, ambulating    well without any complaints.  Her cardiac enzymes have been negative times 3.     She had  a normal stress test and a negative CTA of the chest.  At this time    will have her followup with her primary care physician.  Return for any    further concerns.         FINAL DIAGNOSIS:   Precordial chest pain acute with negative cardiac testing.       DISPOSITION AND PLAN:       The patient discharged.  The patient sent home with instructions to followup    with her primary care physician.  Return to the ER for any worsening of    symptoms.  Tylenol or Advil as needed for pain.  Advance activity as    tolerated.  Encourage plenty of fluid intake.  Told patient that she can take    Pepcid or Prilosec over-the-counter to see if this helps for possible reflux    in origin.  The patient has a colonoscopy and EGD study at the end of the    month.  Told patient that it is important that she keeps that secondary to    this possibly being GI in origin, patient agrees.  Provided Dr. Salley Slaughter    information for another PCP consult.  The patient agrees to followup with her    GI doctor and primary care for any further concerns.  She has no questions or     concerns at this time.  The patient was evaluated by myself and Dr. Ronne Binning who agrees with the above assessment and plan.           ___________________   Posey Pronto MD   Dictated AV:WUJWJ Johnson, [place]   [facility]   D:01/02/2011   T: 01/02/2011 15:40:23   [id number]

## 2011-01-01 NOTE — ED Provider Notes (Signed)
KNOWN ALLERGIES   NKDA       TRIAGE Lucia Bitter [name] 01, 2011 21:14 JAG3)   PATIENT: NAME: Bridget Morton, Bridget Morton, AGE: 55, GENDER: female, DOB: [birth year]         01/10/1956, TIME OF GREET: Wed [name] 01, 2011 21:06, LANGUAGE:         [place], [place]: [id number], KG WEIGHT: 94.3, HEIGHT: 162cm, MEDICAL         RECORD NUMBER: [phone], ACCOUNT NUMBER: [id number], PCP: Blenda Bridegroom,. (Wed [name] 01, 2011 21:14 JAG3)   ADMISSION: URGENCY: 2, AMBULANCE: [id number], TRANSPORT:         Ambulance, DEPT: Emergency, BED: 2ED 36. (Wed [name] 01, 2011 21:14         JAG3)   COMPLAINT:  Medic; Cp. (Wed [name] 01, 2011 21:14 JAG3)   PRESENTING COMPLAINT:  chest pain, Since Today. (21:21 JAG3)   LMP: LMP: Not Applicable. (21:21 JAG3)   TREATMENT PRIOR TO ARRIVAL: Medication: Nitro X 2 , ASA 325mg  PO,         Site: R arm, Gauge: 20. (21:21 JAG3)   TB SCREENING: TB screen not applicable for this patient. (21:21         JAG3)   ABUSE SCREENING: Not Applicable. (21:21 JAG3)   FALL RISK: Fall risk assessment not applicable to this patient.         (21:21 JAG3)   SUICIDAL IDEATION: Suicidal ideation is not present. (21:21         JAG3)   ADVANCE DIRECTIVES: Patient does not have advance directives,         Triage assessment performed. (21:21 JAG3)   PROVIDERS: TRIAGE NURSE: Edman Circle, RN. (Wed [name] 01, 2011         21:14 JAG3)   PREVIOUS VISIT ALLERGIES: Nkda. (Wed [name] 01, 2011 21:14         JAG3)       AMBULANCE (20:57 SNF2)   AMBULANCE: Ambulance: Wed [name] 01, 2011 20:57.       CURRENT MEDICATIONS   Lasix:  20 mg Oral 2 times a day. (21:15 JAG3)   Gabapentin:  300 mg Oral once a day. (21:15 JAG3)   Duragesic-50:  1 patch Topical See Notes. ever 72 hrs. (21:15         JAG3)   Klor-Con 8:  ER 1 tab(s) Oral once a day. (21:15 JAG3)   Cozaar:  25 mg Oral once a day. (21:15 JAG3)   TraZODONE Hydrochloride:  1000 mg Oral once a day. (21:16         JAG3)   Evista:  60 mg Oral once a day. (22:02 JAG3)    Oxycodone Hydrochloride:  10 mg Oral 3 times a day. prn. (22:03         JAG3)   Pantoprazole:  40 mg Oral 2 times a day. (22:04 JAG3)       MEDICATION SERVICE   Neurontin:  Order: Neurontin (Gabapentin) - Dose: 300         mg : Oral         Ordered by: Candis Shine, M.D.         Entered by: Candis Shine, M.D. Thu [name] 02, 2011 01:19 ,          Acknowledged by: Edman Circle, RN Thu [name] 02, 2011 01:22         Documented as given by: Edman Circle, RN Thu [name] 02, 2011  03:22          Patient, Medication, Dose, Route and Time verified prior to         administration.          Time given: 0320, Amount given: 300MG , Site: Medication administered         P.O., Correct patient, time, route, dose and medication confirmed         prior to administration, Patient advised of actions and side-effects         prior to administration, Allergies confirmed and medications reviewed         prior to administration.   Oxycodone Hydrochloride:  Order: Oxycodone Hydrochloride -         Dose: 10 mg : Oral         Ordered by: Zola Button, PA-C         Entered by: Zola Button, PA-C Wed [name] 01, 2011 23:04 ,          Acknowledged by: Edman Circle, RN Wed [name] 01, 2011 23:18,          Held by: Edman Circle, RN Wed [name] 01, 2011 23:18 Reason: Pain         controlled at present,          Hold cancelled by: Edman Circle, RN Thu [name] 02, 2011 00:40         Reason: Pain not controlled at present         Documented as given by: Edman Circle, RN Thu [name] 02, 2011 01:21          Patient, Medication, Dose, Route and Time verified prior to         administration.          Time given: 0120, Amount given: 10mg , Site: Medication administered         P.O., Correct patient, time, route, dose and medication confirmed         prior to administration, Patient advised of actions and side-effects         prior to administration, Allergies confirmed and medications reviewed         prior to administration.    Protonix:  Order: Protonix (Pantoprazole (As Pantoprazole Sodium         Sesquihydrate)) - Dose: 40 mg : Oral         Ordered by: Candis Shine, M.D.         Entered by: Candis Shine, M.D. Thu [name] 02, 2011 01:19 ,          Acknowledged by: Edman Circle, RN Thu [name] 02, 2011 01:22         Documented as given by: Edman Circle, RN Thu [name] 02, 2011 03:21          Patient, Medication, Dose, Route and Time verified prior to         administration.          Time given: 0320, Amount given: 40MG , Site: Medication administered         P.O., Correct patient, time, route, dose and medication confirmed         prior to administration, Patient advised of actions and side-effects         prior to administration, Allergies confirmed and medications reviewed         prior to administration.   Sodium Chloride 0.9%, Intravenous:  Order: Sodium Chloride 0.9%,         Intravenous (Sodium Chloride) - Dose: 1000  mL : IV Fluid         Notes: Bolus         Ordered by: Candis Shine, M.D.         Entered by: Candis Shine, M.D. Thu [name] 02, 2011 02:57 ,          Acknowledged by: Edman Circle, RN Thu [name] 02, 2011 02:58         Documented as given by: Edman Circle, RN Thu [name] 02, 2011 03:21          Patient, Medication, Dose, Route and Time verified prior to         administration.          Amount given: 0320, IV SITE #1 into right antecubital, IV SITE #1 IV         fluids established, IV SITE #1 1st bag hung, amount 1 Liter, IV SITE         #1 bolus of 1000 ml established, IV SITE #1 Rate of bolus, wide open,         via primary tubing, Connections checked prior to administration, Line         traced prior to administration, Catheter placement confirmed via         flush prior to administration, IV site without signs or symptoms of         infiltration during medication administration, No swelling during         administration, No drainage during administration, IV flushed after          administration, Correct patient, time, route, dose and medication         confirmed prior to administration, Patient advised of actions and         side-effects prior to administration, Allergies confirmed and         medications reviewed prior to administration.       ORDERS   One Click Chest Pain:  Ordered for: Sherlon Handing, M.D., Christiane Ha         Status: Done by Sullivan Lone, RN, Blair Hailey [name] 01, 2011 21:28. (21:28         JAG3)   BP Monitor:  Ordered for: Sherlon Handing, M.D., Christiane Ha         Status: Done by Sullivan Lone, RN, Blair Hailey [name] 01, 2011 21:28. (21:28         [name])   BASIC METABOLIC PANEL:  Ordered for: Sherlon Handing, M.D., Christiane Ha         Status: Done by System Wed [name] 01, 2011 22:24. (21:28 JAG3)   CBC, AUTOMATED DIFFERENTIAL:  Ordered for: Sherlon Handing, M.D., Christiane Ha         Status: Done by System Wed [name] 01, 2011 22:09. (21:28 ZOX0)   Cardiac Monitor:  Ordered for: Sherlon Handing, M.D., Christiane Ha         Status: Done by Sullivan Lone, RN, Blair Hailey [name] 01, 2011 21:28. (21:28         JAG3)   O2 sat Monitor:  Ordered for: Sherlon Handing, M.D., Christiane Ha         Status: Done by Sullivan Lone, RN, Blair Hailey [name] 01, 2011 21:28. (21:28         JAG3)   CPK PROFILE:  Ordered for: Sherlon Handing, M.D., Christiane Ha         Status: Done by System Wed [name] 01, 2011 22:29. (21:28 JAG3)   MYOGLOBIN (BLOOD):  Ordered for: Sherlon Handing, M.D., Christiane Ha         Status: Done by System Wed  [name] 01, 2011 22:29. (21:28 JAG3)   12 LEAD EKG:  Ordered for: Sherlon Handing, M.D., Christiane Ha         Status: Active. (21:28 JAG3)   TROPONIN I:  Ordered for: Sherlon Handing, M.D., Christiane Ha         Status: Done by System Wed [name] 01, 2011 22:29. (21:28 JAG3)   IV- Saline Lock:  Ordered for: Sherlon Handing, M.D., Christiane Ha         Status: Done by Sullivan Lone, RN, [facility] [name] 01, 2011 21:28. (21:28         [name])   CHEST 2 VIEWS:  Ordered for: Sherlon Handing, M.D., Christiane Ha         Status: Active. (21:52 NVR)   D-DIMER:  Ordered for: Sherlon Handing, M.D., Christiane Ha         Status: Done by System Thu [name] 02, 2011 01:34. (22:01 NVR)    CONTINUOUS PULSE OX:  Ordered for: Sherlon Handing, M.D., Christiane Ha         Status: Active. (23:50 KMC1)   BP Cuff Adult Regular:  Ordered for: Sherlon Handing, M.D., Christiane Ha         Status: Active. (23:50 KMC1)   MONITOR ELECTRODE:  Ordered for: Sherlon Handing, M.D., Christiane Ha         Status: Active. (23:50 KMC1)   Elita Boone IV Cath:  Ordered for: Sherlon Handing, M.D., Christiane Ha         Status: Active. (23:50 KMC1)   IV Start kit:  Ordered for: Sherlon Handing, M.D., Christiane Ha         Status: Active. (23:50 [facility])   Lab results delayed-check status D Dimer:  Ordered for: Sherlon Handing,         M.D., Christiane Ha         Status: Done by Larene Pickett Thu [name] 02, 2011 00:32. Morey Hummingbird [name] 02, 2011 00:22 NVR)   HEPATIC FUNCTION PANEL:  Ordered for: Sherlon Handing, M.D., Christiane Ha         Status: Done by System Thu [name] 02, 2011 01:50. Morey Hummingbird [name] 02, 2011         00:51 ZOX0)   CTA Chest:  Ordered for: Sherlon Handing, M.D., Christiane Ha         Status: Active         Comment: Suspect pulmonary embolism. Morey Hummingbird [name] 02, 2011 01:41         RUE4)   ED CHEST PAIN OBSERVATION for CEP:  Ordered for: Sherlon Handing, M.D.,         Christiane Ha         Status: Done by Larene Pickett [facility] [name] 02, 2011 03:14. Morey Hummingbird [name] 02, 2011 02:51 JAR0)   TROPONIN I:  Ordered for: Sherlon Handing, M.D., Christiane Ha         Status: Done by System Thu [name] 02, 2011 03:48. Morey Hummingbird [name] 02, 2011         02:58 JAG3)   CPK PROFILE:  Ordered for: Sherlon Handing, M.D., Christiane Ha         Status: Done by System Thu [name] 02, 2011 03:48. Morey Hummingbird [name] 02, 2011         02:58 JAG3)   MYOGLOBIN (BLOOD):  Ordered for: Sherlon Handing, M.D., Christiane Ha         Status: Done by System Thu [name] 02, 2011 03:48. Morey Hummingbird [name] 02, 2011         02:58 JAG3)      Ordered for: Sherlon Handing, M.D., Christiane Ha  Status: Done by System Thu [name] 02, 2011 07:33. Morey Hummingbird [name] 02, 2011         06:15 JAG3)   TROPONIN I:  Ordered for: Sherlon Handing, M.D., Christiane Ha         Status: Done by System Thu [name] 02, 2011 07:33. Morey Hummingbird [name] 02, 2011         06:15 JAG3)   CPK PROFILE:  Ordered for: Sherlon Handing, M.D., Christiane Ha          Status: Done by System Thu [name] 02, 2011 07:33. Morey Hummingbird [name] 02, 2011         06:15 JAG3)       NURSING ASSESSMENT: CARDIOVASCULAR Morey Hummingbird [name] 02, 2011 08:01 MAS7)   PAIN: burning pain, to the epigastric region, Onset of pain 0735,         on a scale 0-10 patient rates pain as 6, new onset as care assumed by         this Clinical research associate.   CARDIOVASCULAR: Cardiovascular assessment findings include heart         rate normal, Heart rhythm normal sinus, Heart sounds normal.   RESPIRATORY/CHEST: Respiratory assessment findings include         respiratory effort easy, Respirations regular, Conversing normally,         Breath sounds clear.   SAFETY: Side rails up, Cart/Stretcher in lowest position, Call         light within reach, Hospital ID band on.       NURSING ASSESSMENT: CV WITH PROCEDURES (21:21 JAG3)   CONSTITUTIONAL: Complex assessment performed, History obtained         from patient, Patient arrives, via [facility], Gait         steady, Patient appears comfortable, Patient cooperative, Patient         alert, Oriented to person, place and time, Skin warm, Skin dry, Skin         normal in color, Mucous membranes pink, Mucous membranes moist.   PAIN: dull pain, to the left chest, Pain radiates, to the left         arm, Onset of pain 01/01/2011 1900, on a scale 0-10 patient rates         pain as 2.   CARDIOVASCULAR: Cardiovascular assessment findings include heart         rate normal, Heart rhythm normal sinus, no associated dyspnea,         Associated with, +1 edema to the upper extremities, +2 edema to the         lower extremities.   RESPIRATORY/CHEST: Respiratory assessment findings include         respiratory effort easy, Respirations regular, Conversing normally,         no signs of distress, Breath sounds clear, to bilateral upper lobes,         to bilateral lower lobes.   CARDIAC MONITOR: Cardiac monitoring indicated for complaint of          chest pain, Patient placed on cardiac monitor, showing normal sinus         rhythm, Patient placed on non-invasive blood pressure monitor,         Patient placed on continuous pulse oximetry, Adult/pediatric         oxisensor applied.       NURSING PROCEDURE: ADMISSION Morey Hummingbird [name] 02, 2011 11:10 TLS4)   ADMISSION: Report called to, Cory Roughen, Provided opportunity to         answer questions,  Notes: pt was picked up for stress test, pts blood         sugar was not checked, pt also took a sip of mountain dew to take         meds (protonix and calcium).       NURSING PROCEDURE: BEDSIDE TESTING Morey Hummingbird [name] 02, 2011 12:07 DWB1)   PATIENT IDENTIFIER: Patient's identity verified by patient         stating name, Patient's identity verified by patient stating birth         date, Patient's identity verified by hospital ID bracelet, Patient         actively involved in identification process.   GLUCOSE: Glucose testing indicated for diabetic patient, Venous         blood sample, Result (mg/dl) 562, Machine number 2.   SAFETY: Side rails up, Cart/Stretcher in lowest position, Call         light within reach, Hospital ID band on.       NURSING PROCEDURE: EKG CHART (21:14 [facility])   PATIENT IDENTIFIER: Patient's identity verified by patient         stating name, Patient's identity verified by patient stating birth         date, Patient's identity verified by hospital ID bracelet, Patient         actively involved in identification process.   EKG: EKG indicated for complaint of chest pain, 12 lead EKG         performed on the left chest, done by Selena Batten, ACT III, first EKG.   FOLLOW-UP: After procedure, EKG for interpretation given to Dr.         Carmela Hurt, EKG was given to Dr. at 2115.   NOTES: Patient tolerated procedure well.   SAFETY: Side rails up, Cart/Stretcher in lowest position, Call         light within reach, Hospital ID band on.       NURSING PROCEDURE: IV    FOLLOW-UP SITE 1: IV discontinued, catheter intact, Notes: EMS iv         dc'd right hand. Morey Hummingbird [name] 02, 2011 11:14 TLS4)   IV SITE 2: IV established, to the left antecubital, using a 20         gauge catheter, in two attempts, Saline lock established, Flushed         with normal saline (mls): 10, Notes: needed for CT. Morey Hummingbird [name] 02, 2011         02:02 JMB2)       NURSING PROCEDURE: LAB DRAW   PATIENT IDENTIFIER: Patient's identity verified by patient         stating name, Patient's identity verified by patient stating birth         date, Patient's identity verified by hospital ID bracelet, Patient         actively involved in identification process. (21:45 [facility])     Patient's identity verified by patient stating name, Patient's identity         verified by patient stating birth date, Patient's identity verified         by hospital ID bracelet. Morey Hummingbird [name] 02, 2011 06:59 JDP1)   LAB DRAW: Initial lab draw performed, by venipuncture, from left         antecubital, in one attempt. (21:45 [facility])     Initial lab draw performed, from vascular access device, existing IV         site, R-AC, After labs drawn, device  flushed with saline, amount (mL)         10CC, Lab specimens labeled in the presence of the patient and sent         to lab, @ 0659, Tourniquet removed from patient after procedure. Morey Hummingbird         [name] 02, 2011 06:59 JDP1)   FOLLOW-UP: After procedure, dressing applied to site, After         procedure, no swelling at site, After procedure, no active bleeding         from site. (21:45 [facility])   NOTES: Patient tolerated procedure well. (21:45 [facility])     Patient tolerated procedure well. Morey Hummingbird [name] 02, 2011 06:59 JDP1)   SAFETY: Side rails up, Cart/Stretcher in lowest position, Call         light within reach, Hospital ID band on. (21:45 KMC1)     Side rails up, Cart/Stretcher in lowest position, Call light within         reach, Hospital ID band on. Morey Hummingbird [name] 02, 2011 06:59 JDP1)       NURSING PROCEDURE: NURSE NOTES    NURSES NOTES: Shift change report given, to Mannie Stabile RN,         Provided opportunity to answer questions. Morey Hummingbird [name] 02, 2011 07:14         JAG3)     Notes: given Mylanta 30 cc per protocol w/ resolution of symptoms.         assumed care of pt. CM NSR w/o ectopy. Awaiting stress echo.         reinforced NPO until testing complete. Morey Hummingbird [name] 02, 2011 07:35         MAS7)     Notes: awating stress test. NPO continued, CM NSR. Morey Hummingbird [name] 02, 2011         09:44 MAS7)     Notes: attempted to call report, unable to give report waiting for call         back. Morey Hummingbird [name] 02, 2011 10:53 TLS4)     Notes: accucheck rechecked. Pt transported to cardiology. Updated report         given to suzanne Lung, RN in Obs. Pt to go to obs after stress test.         Morey Hummingbird [name] 02, 2011 12:15 MAS7)   VITAL SIGNS: BP: 118, / 72, Pulse: 74, Resp: 12. Morey Hummingbird [name] 02, 2011 09:44 MAS7)       DIAGNOSIS Morey Hummingbird [name] 02, 2011 02:51 JAR0)   FINAL: PRIMARY: Chest pain - precordial.       DISPOSITION   PATIENT:  Disposition Type: Observation, Disposition: Chest Pain         Observation, Condition: Stable. Morey Hummingbird [name] 02, 2011 02:51 JAR0)      IV Infusion: N/A, Patient left the department. Morey Hummingbird [name] 02, 2011 12:29         MAS7)   Key:     DWB1=Byers, ACT III, Onalee Hua  JAG3=Gilbert, RN, Marya Fossa,     M.D., Christiane Ha     JDP1=Pitts, ACT III, Jamar  JMB2=Snowden, LPN, Jessica  ZOX0=RUEAVW, ACT     III, Kim     MAS7=Stevens Dewayne Hatch), RN, Claris Che  NVR=Rice, PA-C, [employer]  SNF2=Fleming,     RN, Talbert Forest     TLS4=Sondergaard, RN, Ladona Ridgel

## 2011-01-01 NOTE — Procedures (Signed)
Study ID: [id number]                                                      [facility]                                                      [address] [place], [place] [address]                            Exercise Stress Echocardiogram Report           Name: [name], Bridget Morton Date: 01/02/2011 12:27 PM   MRN: [id number]                       Patient Location: [id number]^H   DOB: Nov 12, 1955                   Age: 55 yrs   BP: 127/63 mmHg                   HR: 77   Gender: Female                    Account #: [id number]   Reason For Study: CHEST PAIN   Ordering Physician: Lucita Ferrara   Performed By: Dwyane Luo       Interpretation Summary   The Electrocardiographic Interpretation: negative by ECG criteria.   The Echocardiographic Interpretation: hyperkinetic wall motion.   The OverAll Impression : negative for ischemia with low likelihood for CAD.       Stress Results              Protocol:  Bruce              Target HR: 141 bpm         Maximum Predicted HR: 166 bpm                          Stress Duration:   3:51 mm:ss *                      Maximum Stress HR: 151 bpm *       Stress Comments   A treadmill exercise test according to Bruce protocol was performed. The   baseline ECG displays normal sinus rhythm. There no ST segment changes during   stress. Arrhythmia induced during stress: occasional PAC's. Test was   terminated due to target heart rate was achieved. Patient had no chest pain.   B/P Response to exercise: Resting hypertension - appropriate response.           I  WMSI = 1.00     % Normal = 100                                                                   REST                                                                   II      WMSI = 1.00     % Normal = 100                                                                   PEAK                                                                                                                                Segments  Size   X - Cannot                2 -                      4 -          1-2     small   Interpret    1 - Normal   Hypokinetic 3 - Akinetic Dyskinetic   3-5     moder   ate   5 -                                                             6-14    large   Aneurysmal                                                      15-16   diffu   se               Left Ventricle   The left ventricular chamber size  at rest is normal. The left ventricular   chamber size at peak stress is smaller.           _____________________________________________________________________________   __           Electronically signed byDr. Ola Spurr, MD   01/02/2011 01:24                           PM

## 2011-01-01 NOTE — Procedures (Signed)
Test Reason : Chest pain   Blood Pressure : ***/*** mmHG   Vent. Rate : 079 BPM     Atrial Rate : 079 BPM      P-R Int : 182 ms          QRS Dur : 076 ms       QT Int : 362 ms       P-R-T Axes : 046 000 023 degrees      QTc Int : 415 ms   Normal sinus rhythm   Minimal voltage criteria for LVH, may be normal variant   Otherwise normal ECG   When compared with ECG of 23-Aug-2008 12:05,   No significant change was found   Confirmed by Hyacinth Meeker, M.D., Ramon Dredge ([id number]) on 01/02/2011 11:21:50 AM   Referred By:             Gay Filler By: Berton Gleason, M.D.

## 2011-02-19 ENCOUNTER — Ambulatory Visit: Payer: Self-pay | Admitting: Family Medicine

## 2011-12-16 DIAGNOSIS — G8929 Other chronic pain: Secondary | ICD-10-CM | POA: Insufficient documentation

## 2011-12-16 DIAGNOSIS — R609 Edema, unspecified: Secondary | ICD-10-CM | POA: Insufficient documentation

## 2011-12-16 DIAGNOSIS — M1990 Unspecified osteoarthritis, unspecified site: Secondary | ICD-10-CM | POA: Insufficient documentation

## 2011-12-16 DIAGNOSIS — K648 Other hemorrhoids: Secondary | ICD-10-CM | POA: Insufficient documentation

## 2011-12-16 DIAGNOSIS — E882 Lipomatosis, not elsewhere classified: Secondary | ICD-10-CM | POA: Insufficient documentation

## 2011-12-16 MED ORDER — METRONIDAZOLE 500 MG TAB
500 mg | ORAL_TABLET | Freq: Two times a day (BID) | ORAL | Status: AC
Start: 2011-12-16 — End: 2011-12-30

## 2011-12-16 NOTE — Progress Notes (Signed)
Bridget Morton is a 56 y.o. female in today to establish care. Learning assessment complete; primary language is [place].

## 2011-12-16 NOTE — Progress Notes (Signed)
Bridget Morton is a 56 y.o.  female and presents with Establish Care       Subjective:    Chronic pain - managed by Dr. Buford Dresser.    Constipation - now having diarrhea since  8/10, soft stools that float.  No blood or mucous in stool. Husband recently hospitalized a few weeks ago for salmonella infection.  Having fecal incontinence.  States it has an awful metallic smell.  Recently given clindamycin, started 8/8.  Stopped 8/13 b/c she thought it might be causing diarrhea.  Also having ble edema.  Low grade fever.  Taking probiotics.    Anemia - on iron supplement. Colo done 2010, significant only for internal hemorrhoids.  Feels tired and pale.    adiposis dolorosa- has numerous fatty lumps.  States she's gained weight b/c of this condition.    Obesity -s/p gastric bypass surgery.    ROS:  Constitutional: No recent weight change. No weakness/+fatigue.  No f/c.   Skin: No rashes, change in nails/hair, itching   HENT: No HA, dizziness. No hearing loss/tinnitus.  No nasal congestion/discharge.   Eyes: No change in vision, double/blurred vision or eye pain/redness.    Cardiovascular: No CP/palpitations.  No DOE/orthopnea/PND.   Respiratory: No cough/sputum, dyspnea, wheezing.   Gastointestinal: No dysphagia, reflux.  No n/v.  + constipation/diarrhea.  No melena, + rectal bleeding.   Genitourinary: No dysuria, urinary hesitancy, nocturia, hematuria.  No incontinence.   Musculoskeletal: + joint pain.  No muscle pain/tenderness.   Endo: No heat/cold intolerance, no polyuria/polydypsia.   Heme: + h/o anemia.  No easy bleeding/bruising.   Allergy/Immunology: No seasonal rhinitis. Denies frequent colds, sinus/ear infections.   Neurological: No seizures/numbness/weakness.  No paresthesias.     PMH:  Past Medical History   Diagnosis Date   ??? H/O colonoscopy 2010     due 2020   ??? Chronic pain    ??? Arthritis        Patient Active Problem List   Diagnoses Code   ??? Internal hemorrhoid 455.0   ??? OA (osteoarthritis) 715.90   ???  Diarrhea 787.91   ??? Edema 782.3   ??? Iron deficiency anemia 280.9   ??? Adiposis dolorosa 272.8   ??? Chronic pain 338.29       PSH:  Past Surgical History   Procedure Date   ??? Hx gyn    ??? Hx partial hysterectomy 2000   ??? Hx orthopaedic      L4-L5 fusion   ??? Hx knee replacment      bilateral   ??? Hx rotator cuff repair      x 3 on right   ??? Hx gastric bypass 2004   ??? Hx appendectomy    ??? Hx tonsil and adenoidectomy      childhood   ??? Hx cesarean section 1978, 1982        SH:  History   Substance Use Topics   ??? Smoking status: Former Smoker     Types: Cigarettes     Quit date: 12/16/2010   ??? Smokeless tobacco: Never Used   ??? Alcohol Use: Yes      seldom       FH:  Family History   Problem Relation Age of Onset   ??? Alcohol abuse Mother    ??? Cancer Father    ??? Diabetes Father    ??? Heart Disease Father    ??? Cancer Brother        Medications/Allergies:  Current outpatient  prescriptions:celecoxib (CELEBREX) 200 mg capsule, Take  by mouth two (2) times a day., Disp: , Rfl: ;  furosemide (LASIX) 20 mg tablet, Take  by mouth daily., Disp: , Rfl: ;  potassium chloride SR (KLOR-CON 8) 8 mEq tablet, Take 8 mEq by mouth two (2) times a day., Disp: , Rfl: ;  pregabalin (LYRICA) 100 mg capsule, Take  by mouth two (2) times a day., Disp: , Rfl:   traZODone (DESYREL) 100 mg tablet, Take 100 mg by mouth nightly., Disp: , Rfl: ;  oxyCODONE CR (OXYCODONE CR) 20 mg CR tablet, Take  by mouth every twelve (12) hours., Disp: , Rfl: ;  oxyCODONE IR (ROXICODONE) 10 mg Tab immediate release tablet, Take  by mouth., Disp: , Rfl: ;  montelukast (SINGULAIR) 10 mg tablet, Take 10 mg by mouth daily., Disp: , Rfl:   clindamycin (CLEOCIN) 300 mg capsule, Take 300 mg by mouth three (3) times daily., Disp: , Rfl: ;  nystatin-triamcinolone (MYCOLOG II) topical cream, Apply  to affected area two (2) times a day., Disp: , Rfl: ;  multivitamin (ONE A DAY) tablet, Take 1 Tab by mouth daily., Disp: , Rfl: ;  ferrous sulfate 325 mg (65 mg iron) tablet, Take  by  mouth Daily (before breakfast)., Disp: , Rfl:   calcium citrate-vitamin D3 (CITRACAL WITH VITAMIN D MAXIMUM) tablet, Take 2 Tabs by mouth three (3) times daily., Disp: , Rfl: ;  cyanocobalamin 1,000 mcg Subl, by SubLINGual route., Disp: , Rfl: ;  magnesium 500 mg Tab, Take  by mouth., Disp: , Rfl:     Allergies no known allergies    Objective:  BP 120/70   Pulse 64   Temp(Src) 98.4 ??F (36.9 ??C) (Oral)   Resp 20   Ht 5\' 4"  (1.626 m)   Wt 211 lb 3.2 oz (95.8 kg)   BMI 36.25 kg/m2   SpO2 99% Body mass index is 36.25 kg/(m^2).  Constitutional: Well developed, nourished, no distress, alert, obese habitus   HENT: Exterior ears and tympanic membranes normal bilaterally. Supple neck. No thyromegaly or lymphadenopathy. Oropharynx clear and moist mucous membranes.     Eyes: Conjunctiva normal. PERRL.    Cardiovascular: S1, S2.  RRR.  No murmurs/rubs.  No thrills palpated.  No carotid bruits.  Intact distal pulses.  1+ edema.   Pulmonary/Chest Wall: No abnormalities on inspection.  Clear to auscultation bilaterally. No wheezing/rhonchi.  Normal effort.     GI: Soft, nondistended.  Normal active bowel sounds. Minimal RUQ TTP. No rebound or guarding.   Musculoskeletal: Gait normal.  Normal ROM all extremities.     Neurological: Appropriate.  Speech normal.   Skin: No lesions/rashes on inspection.     Psych: Appropriate affect, judgement and insight.  Short-term memory intact.         Assessment/Plan:    Iron deficient anemia - ck cbc and iron profile.    Diarrhea - suspect c diff given recent clinda use.  Ck stool for c diff and culture for salmonella given recent exposure.  Empiric rx w/ flagyl x 14d.    BLE edema - TED hose (pt already has).  Cont lasix.  Ck albumin and lytes.  No sx of CHF.    OA and chronic pain - managed by Dr. Buford Dresser.  New referral to rheum as pt is unhappy with current rheumatologist.    Obesity: Discussed the patient's BMI with her.  The BMI follow up plan is as follows: BMI is out of normal  parameters and plan is as follows: I have counseled this patient on diet and exercise regimens.    Health Maintenance: mammo scheduled.  Up to date w/ pap, followed by gyn.    Orders Placed This Encounter   ??? celecoxib (CELEBREX) 200 mg capsule     Sig: Take  by mouth two (2) times a day.   ??? furosemide (LASIX) 20 mg tablet     Sig: Take  by mouth daily.   ??? potassium chloride SR (KLOR-CON 8) 8 mEq tablet     Sig: Take 8 mEq by mouth two (2) times a day.   ??? pregabalin (LYRICA) 100 mg capsule     Sig: Take  by mouth two (2) times a day.   ??? traZODone (DESYREL) 100 mg tablet     Sig: Take 100 mg by mouth nightly.   ??? oxyCODONE CR (OXYCODONE CR) 20 mg CR tablet     Sig: Take  by mouth every twelve (12) hours.   ??? oxyCODONE IR (ROXICODONE) 10 mg Tab immediate release tablet     Sig: Take  by mouth.   ??? montelukast (SINGULAIR) 10 mg tablet     Sig: Take 10 mg by mouth daily.   ??? clindamycin (CLEOCIN) 300 mg capsule     Sig: Take 300 mg by mouth three (3) times daily.   ??? nystatin-triamcinolone (MYCOLOG II) topical cream     Sig: Apply  to affected area two (2) times a day.   ??? multivitamin (ONE A DAY) tablet     Sig: Take 1 Tab by mouth daily.   ??? ferrous sulfate 325 mg (65 mg iron) tablet     Sig: Take  by mouth Daily (before breakfast).   ??? calcium citrate-vitamin D3 (CITRACAL WITH VITAMIN D MAXIMUM) tablet     Sig: Take 2 Tabs by mouth three (3) times daily.   ??? cyanocobalamin 1,000 mcg Subl     Sig: by SubLINGual route.   ??? magnesium 500 mg Tab     Sig: Take  by mouth.

## 2011-12-17 LAB — CBC WITH AUTOMATED DIFF
ABS. BASOPHILS: 0 10*3/uL (ref 0.0–0.2)
ABS. EOSINOPHILS: 0 10*3/uL (ref 0.0–0.4)
ABS. IMM. GRANS.: 0 10*3/uL (ref 0.0–0.1)
ABS. MONOCYTES: 0.3 10*3/uL (ref 0.1–1.0)
ABS. NEUTROPHILS: 3.3 10*3/uL (ref 1.8–7.8)
Abs Lymphocytes: 1.2 10*3/uL (ref 0.7–4.5)
BASOPHILS: 1 % (ref 0–3)
EOSINOPHILS: 0 % (ref 0–7)
HCT: 37.7 % (ref 34.0–46.6)
HGB: 12 g/dL (ref 11.1–15.9)
IMMATURE GRANULOCYTES: 0 % (ref 0–2)
Lymphocytes: 24 % (ref 14–46)
MCH: 27 pg (ref 26.6–33.0)
MCHC: 31.8 g/dL (ref 31.5–35.7)
MCV: 85 fL (ref 79–97)
MONOCYTES: 6 % (ref 4–13)
NEUTROPHILS: 69 % (ref 40–74)
PLATELET: 192 10*3/uL (ref 140–415)
RBC: 4.45 x10E6/uL (ref 3.77–5.28)
RDW: 14 % (ref 12.3–15.4)
WBC: 4.8 10*3/uL (ref 4.0–10.5)

## 2011-12-17 LAB — CULTURE, STOOL

## 2011-12-17 LAB — METABOLIC PANEL, COMPREHENSIVE
A-G Ratio: 1.7 (ref 1.1–2.5)
ALT (SGPT): 15 IU/L (ref 0–32)
AST (SGOT): 17 IU/L (ref 0–40)
Albumin: 4.3 g/dL (ref 3.5–5.5)
Alk. phosphatase: 91 IU/L (ref 42–107)
BUN/Creatinine ratio: 27 — ABNORMAL HIGH (ref 9–23)
BUN: 16 mg/dL (ref 6–24)
Bilirubin, total: 0.3 mg/dL (ref 0.0–1.2)
CO2: 27 mmol/L (ref 19–28)
Calcium: 9.6 mg/dL (ref 8.7–10.2)
Chloride: 100 mmol/L (ref 97–108)
Creatinine: 0.59 mg/dL (ref 0.57–1.00)
GFR est non-AA: 104 mL/min/{1.73_m2} (ref >59)
GLOBULIN, TOTAL: 2.5 g/dL (ref 1.5–4.5)
Glucose: 80 mg/dL (ref 65–99)
Potassium: 4.2 mmol/L (ref 3.5–5.2)
Protein, total: 6.8 g/dL (ref 6.0–8.5)
Sodium: 140 mmol/L (ref 134–144)
eGFR If African American: 119 mL/min/{1.73_m2} (ref >59)

## 2011-12-17 LAB — PLEASE NOTE

## 2011-12-17 LAB — FERRITIN: Ferritin: 223 ng/mL — ABNORMAL HIGH (ref 15–150)

## 2011-12-17 LAB — IRON PROFILE
Iron % saturation: 13 % — ABNORMAL LOW (ref 15–55)
Iron: 39 ug/dL (ref 35–155)
TIBC: 298 ug/dL (ref 250–450)
UIBC: 259 ug/dL (ref 150–375)

## 2011-12-17 NOTE — Progress Notes (Signed)
Patient's husband reports that she has been complaining of feeling weak and tired.     I spoke with patient, she also has c/o feeling "really tired and I just want to sleep". She has a "pale color to her hands and feet". Her blood pressure today at an office visit with [facility] was 90/70. She has a hx of anemia. Please advise.

## 2011-12-17 NOTE — Progress Notes (Signed)
Quick Note:    Please call patient and tell her that labs showed she was slightly dehydrated, so she should drink plenty of fluids, especially while she has diarrhea. Her iron stores were a bit low as well. Once she is over her infection, she can start taking an OTC iron supplement.  ______

## 2011-12-17 NOTE — Progress Notes (Signed)
The patient's blood count was normal on her labs 8/20 and did not show significant signs of dehydration on the bloodwork.  However, with a blood pressure that low, she needs to go to ER for evaluation.  She should also hold on her lasix while having diarrhea.

## 2011-12-17 NOTE — Progress Notes (Signed)
Quick Note:    Patient and husband aware of results.   ______

## 2011-12-17 NOTE — Progress Notes (Signed)
Patient and husband aware of lab results and recommendations.

## 2011-12-20 LAB — C DIFFICILE TOXIN A & B BY EIA: C. difficile Toxins A+B: NEGATIVE

## 2012-01-14 DIAGNOSIS — E611 Iron deficiency: Secondary | ICD-10-CM | POA: Insufficient documentation

## 2012-01-14 NOTE — Progress Notes (Signed)
Bridget Morton is a 56 y.o. female and presents with Follow-up       Subjective:    Pt w/ diarrhea - followed by GI who put on a "pill" to harden stool.  Had been checked for c. Diff which was neg.  This has improved, now having soft stools.  Has been ongoing x 2-21mos.      Iron deficiency -pt is s/p gastric bypass. Iron still low, but now assoc anemia.    Also notes BLE edema.  Wearing ted hose.  States she gets dyspneic walking up stairs and with activity.  No orthopnea or pnd.    Pt also reports high bs after eating, into 200s.  And will 'feel horrible'.  Also reports bs as low as 50.    Chronic pain - followed by [facility].  States her back pain has gotten worse, not well controlled by pain meds.    ROS:  Constitutional: No recent weight change. +fatigue.  No f/c.   Skin: No rashes, change in nails/hair, itching   HENT: No HA, dizziness. No hearing loss/tinnitus.  No nasal congestion/discharge.   Eyes: No change in vision, double/blurred vision or eye pain/redness.    Cardiovascular: No CP/palpitations.  No DOE/orthopnea/PND.   Respiratory: No cough/sputum, dyspnea, wheezing.   Gastointestinal: No dysphagia, reflux.  No n/v.  +diarrhea.  No melena/rectal bleeding.   Genitourinary: No dysuria, urinary hesitancy, nocturia, hematuria.  No incontinence.   Musculoskeletal: + joint pain.  No muscle pain/tenderness.   Endo: No heat/cold intolerance, no polyuria/polydypsia.   Heme: No h/o anemia.  No easy bleeding/bruising.   Allergy/Immunology: No seasonal rhinitis. Denies frequent colds, sinus/ear infections.   Neurological: No seizures/numbness/weakness.  No paresthesias.   Psychiatric:  + depression, noanxiety.     The problem list was updated as a part of today's visit.  Patient Active Problem List   Diagnosis Code   ??? Internal hemorrhoid 455.0   ??? OA (osteoarthritis) 715.90   ??? Diarrhea 787.91   ??? Edema 782.3   ??? Iron deficiency anemia 280.9   ??? Adiposis dolorosa 272.8   ??? Chronic pain 338.29       The PSH, FH  were reviewed.      SH:  History   Substance Use Topics   ??? Smoking status: Former Smoker -- 10 years     Types: Cigarettes     Quit date: 12/16/2010   ??? Smokeless tobacco: Never Used   ??? Alcohol Use: Yes      seldom         Medications/Allergies:  Current Outpatient Prescriptions on File Prior to Visit   Medication Sig Dispense Refill   ??? celecoxib (CELEBREX) 200 mg capsule Take  by mouth two (2) times a day.       ??? furosemide (LASIX) 20 mg tablet Take  by mouth daily.       ??? potassium chloride SR (KLOR-CON 8) 8 mEq tablet Take 8 mEq by mouth two (2) times a day.       ??? pregabalin (LYRICA) 100 mg capsule Take  by mouth two (2) times a day.       ??? traZODone (DESYREL) 100 mg tablet Take 100 mg by mouth nightly.       ??? oxyCODONE CR (OXYCODONE CR) 20 mg CR tablet Take  by mouth every twelve (12) hours.       ??? oxyCODONE IR (ROXICODONE) 10 mg Tab immediate release tablet Take  by mouth.       ???  montelukast (SINGULAIR) 10 mg tablet Take 10 mg by mouth daily.       ??? nystatin-triamcinolone (MYCOLOG II) topical cream Apply  to affected area two (2) times a day.       ??? multivitamin (ONE A DAY) tablet Take 1 Tab by mouth daily.       ??? ferrous sulfate 325 mg (65 mg iron) tablet Take  by mouth Daily (before breakfast).       ??? calcium citrate-vitamin D3 (CITRACAL WITH VITAMIN D MAXIMUM) tablet Take 2 Tabs by mouth three (3) times daily.       ??? cyanocobalamin 1,000 mcg Subl by SubLINGual route.       ??? magnesium 500 mg Tab Take  by mouth.          Allergies no known allergies    Objective:  BP 145/90   Pulse 66   Temp 98.6 ??F (37 ??C) (Oral)   Resp 17   Ht 5\' 4"  (1.626 m)   Wt 209 lb (94.802 kg)   BMI 35.87 kg/m2   SpO2 97% Body mass index is 35.87 kg/(m^2).  Constitutional: Well developed, nourished, no distress, alert, obese habitus   Eyes: Conjunctiva normal. PERRL.    CV: S1, S2.  RRR.  No murmurs/rubs.  No thrills palpated.  No carotid bruits.  Intact distal pulses.  No edema.   Pulm: No abnormalities on inspection.   Clear to auscultation bilaterally. No wheezing/rhonchi.  Normal effort.     GI: Soft, nontender, nondistended.  Normal active bowel sounds.    MS: Gait normal.  +all trigger points tender c/w fibromyalgia.  Joints without deformity/tenderness.  Strength intact bilateral upper and lower ext.       Neuro: A/O x 3. No focal motor or sensory deficits. Speech normal.   Skin: No lesions/rashes on inspection.     Psych: blunted affect,appropriate judgement and insight.  Short-term memory intact.       Labwork and Ancillary Studies:    CBC w/Diff  Lab Results   Component Value Date/Time    WBC 4.8 12/16/2011 12:00 AM    HGB 12.0 12/16/2011 12:00 AM    PLATELET 192 12/16/2011 12:00 AM         Basic Metabolic Profile  Lab Results   Component Value Date/Time    Sodium 140 12/16/2011 12:00 AM    Potassium 4.2 12/16/2011 12:00 AM    Chloride 100 12/16/2011 12:00 AM    CO2 27 12/16/2011 12:00 AM    Glucose 80 12/16/2011 12:00 AM    BUN 16 12/16/2011 12:00 AM    Creatinine 0.59 12/16/2011 12:00 AM    BUN/Creatinine ratio 27 12/16/2011 12:00 AM    GFR est non-AA 104 12/16/2011 12:00 AM    Calcium 9.6 12/16/2011 12:00 AM     Assessment/Plan:      [name] was seen today for follow-up.    Diagnoses and associated orders for this visit:    Edema of both legs - suspect venous insufficiency.    - METABOLIC PANEL, COMPREHENSIVE  - ULTRASOUND EXTREMITY VENOUS DOPPLER BILATERAL; Future  - TED hose, elevate when possible    Diarrhea, given exposure to salmonella (husband had it), need to check stool culture.  It was previously ordered, but cancelled by lab.  - METABOLIC PANEL, COMPREHENSIVE  - CULTURE, STOOL  - SED RATE (ESR); Future    Hyperglycemia  - HEMOGLOBIN A1C    Acute back pain in setting of chronic back pain and arthritis, r/o  rheumatoid  - SED RATE (ESR); Future  - RHEUMATOID FACTOR  - CYCLIC CITRUL PEPTIDE AB, IGG  - Dr. Buford Dresser for chronic pain management    Fibromyalgia  - REFERRAL TO PHYSICAL THERAPY fibromyalgia program.  - Has rheum  appt 10/13 w/ Dr. Hyacinth Meeker.  - Suspect pt has underlying component of depression that is not well treated.  Would like to consider switching to cymbalta from lyrica.      Health Maintenance: has pap upcoming  Health Maintenance   Topic Date Due   ??? Cervical Cancer Scrn Pap Smear  10/14/1977   ??? Influenza Vaccine  12/28/2011   ??? Breast Cancer Scrn Mammogram  11/12/2013   ??? Colon Cancer Scrn Colonoscopy  01/13/2017

## 2012-01-14 NOTE — Progress Notes (Signed)
Bridget Morton is a 56 y.o. female here today for a follow up visits.

## 2012-01-14 NOTE — Progress Notes (Unsigned)
Reviewed record in preparation for visit and have obtained necessary documentation.

## 2012-01-14 NOTE — Patient Instructions (Addendum)
Bring stool sample in

## 2012-01-15 LAB — RHEUMATOID FACTOR
Rheumatoid Factor: <7 IU/mL (ref 0.0–13.9)
Rheumatoid factor: <7 IU/mL (ref 0.0–13.9)

## 2012-01-15 LAB — METABOLIC PANEL, COMPREHENSIVE
A-G Ratio: 1.8 (ref 1.1–2.5)
ALT (SGPT): 14 IU/L (ref 0–32)
AST (SGOT): 16 IU/L (ref 0–40)
Albumin: 4.5 g/dL (ref 3.5–5.5)
Alk. phosphatase: 72 IU/L (ref 42–107)
BUN/Creatinine ratio: 30 — ABNORMAL HIGH (ref 9–23)
BUN: 20 mg/dL (ref 6–24)
Bilirubin, total: 0.4 mg/dL (ref 0.0–1.2)
CO2: 29 mmol/L — ABNORMAL HIGH (ref 19–28)
Calcium: 10 mg/dL (ref 8.7–10.2)
Chloride: 98 mmol/L (ref 97–108)
Creatinine: 0.66 mg/dL (ref 0.57–1.00)
GFR est AA: 115 mL/min/{1.73_m2} (ref >59)
GFR est non-AA: 100 mL/min/{1.73_m2} (ref >59)
GLOBULIN, TOTAL: 2.5 g/dL (ref 1.5–4.5)
Glucose: 85 mg/dL (ref 65–99)
Potassium: 4.5 mmol/L (ref 3.5–5.2)
Protein, total: 7 g/dL (ref 6.0–8.5)
Sodium: 139 mmol/L (ref 134–144)

## 2012-01-15 LAB — SED RATE (ESR): Sed rate (ESR): 3 mm/hr (ref 0–40)

## 2012-01-15 LAB — CULTURE, STOOL

## 2012-01-15 LAB — HEMOGLOBIN A1C WITH EAG: Hemoglobin A1c: 5.4 % (ref 4.8–5.6)

## 2012-01-15 NOTE — Progress Notes (Signed)
Quick Note:    Can you call the lab and find out why the stool culture order was cancelled? This is the second time I've ordered it and it was cancelled by lab!  ______

## 2012-01-16 LAB — CYCLIC CITRUL PEPTIDE AB, IGG: CCP Antibodies IgG/IgA: <1 units (ref 0–19)

## 2012-01-19 ENCOUNTER — Encounter

## 2012-01-20 NOTE — Progress Notes (Signed)
[facility] ??? [facility] PHYSICAL THERAPY  [address], [place], [place] [address] - Phone: [phone]  Fax: [phone]  PLAN OF CARE / STATEMENT OF MEDICAL NECESSITY FOR PHYSICAL THERAPY SERVICES  Patient Name: Bridget Morton DOB: 05/31/1955   Treatment Diagnosis: Myalgia and myositis, unspecified [729.1] Onset Date: 2010   Referral Source: Kerman Passey, MD Start of Care [facility]): 01/20/2012   Prior Hospitalization: Na Provider #: [id number]   Prior Level of Function: Pain free ADLs and leisure activities   Comorbidities: Fibromyalgia, Depression, OA, osteoporosis, lumbar fusion, (B) TKA   The Plan of Care and following information is based on the information from the initial evaluation.   ===========================================================================================  Assessment / key information:  Pt is a 56 yo female with dx of fibromyalgia .  He/she presents with pain ranging from 2-10/10, located lumbar spine.  Pain is made worse with sitting, activity, daily ADLs, better with walking .  Pt has no c/o radicular sxs in the (B) LE.  L/S AROM: flexion 80 deg, extension 15 deg, LSB 40 de, RSB 40 deg.  Palpation reveals TTP 2+ to (B) lumbar paraspinals.  Posture decreased lumbar lordosis.  Sensation is intact to light touch.  MMT LE???s: hip flexion 4/5, extension 4/5, abduction 4/5, adduction 4/5, knee flexion 4/5, knee extension 4/5, ankle 4/5. Core stability is 2/5.  Repeated movement testing reveals no directional preference. (-) Special tests include: SLR, Slump.  HS 90/90 (R) 10 deg, (L) 10 deg.  Pt will benefit from PT interventions to address the aforementioned deficits and allow pt to return to PLOF.   ===========================================================================================  Problem List: pain affecting function, decrease ADL/ functional abilitiies, decrease activity tolerance and decrease flexibility/ joint mobility   Treatment Plan may  include any combination of the following: Therapeutic exercise, Therapeutic activities, Neuromuscular re-education, Manual therapy and Patient education  Patient / Family readiness to learn indicated by: asking questions, trying to perform skills and interest  Persons(s) to be included in education: patient (P)  Barriers to Learning/Limitations: no  Measures taken:    Patient Goal (s): Decrease pain limiting function   Rehabilitation Potential: fair    Short Term Goals: To be accomplished in  2-3  weeks:  1. Pt will be independent and compliant with HEP to decrease pain, increase ROM and return pt to PLOF.    2. Pt will note pain less than 5/10 at worst to allow increase in functional mobility during ADLs    Long Term Goals: To be accomplished in  4-6  weeks:  1. Decrease score on the Fibromyalgia Impact Questionnaire by > or = 14pts to demo an increase in functional activity tolerance.   2. Pt will note < or = 2/10 pain with all mobility to improve comfort with ADL???s.   3. Pt will be able to demonstrates increase standing tolerance greater than 30 mins to perform ADLs  Frequency / Duration:   Patient to be seen  2-3  times per week for 4-6  weeks:  Patient / Caregiver education and instruction: self care, activity modification and exercises  G-Codes (GP): na  Therapist Signature: Wenda Low PT, DPT Date: 01/20/2012   Certification Period: na Time: 4:53 PM   ===========================================================================================  I certify that the above Physical Therapy Services are being furnished while the patient is under my care.  I agree with the treatment plan and certify that this therapy is necessary.    Physician Signature:  Date:       Time:     Please sign and return to [facility] or you may fax the signed copy to [phone].  Thank you.

## 2012-01-20 NOTE — Progress Notes (Signed)
PHYSICAL THERAPY - DAILY TREATMENT NOTE    Patient Name: Bridget Morton        Date: 01/20/2012  DOB: [birth year]   YES Patient DOB Verified  Visit #:   1   of   12  Insurance: Payor: [employer]  Plan: [employer] HMO  Product Type: HMO       In time: 415 Out time: 450   Total Treatment Time: 35     TREATMENT AREA =  Fibromyalgia     SUBJECTIVE  Pain Level (on 0 to 10 scale):  2  / 10   Medication Changes/New allergies or changes in medical history, any new surgeries or procedures?    NO    If yes, update Summary List   Subjective Functional Status/Changes:  []   No changes reported     See POC          OBJECTIVE  Modality rationale:      min []  Estim, type:                                          []   att     []   unatt     []   w/US     []   w/ice    []   w/heat    min []   Mechanical Traction: type/lbs                                               []   pro   []   sup   []   int   []   cont    min []   Ultrasound, settings/location:      min []   Iontophoresis:  []   take home patch w/ dexamethazone    min                                []   in clinic w/ dexamethazone    min []   Ice     []   Heat     position:     min []   Other:       min Manual Therapy:       min Therapeutic Exercise:  [x]   See flow sheet   []   Other:      []   Added:     to improve (function):    []   Changed:     to improve (function):       min Patient Education:  YES  Reviewed HEP   []   Progressed/Changed HEP based on:        Other Objective/Functional Measures:    See POC     Post Treatment Pain Level (on 0 to 10) scale:   2  / 10     ASSESSMENT  []   See Progress Note/Recertification   Patient will continue to benefit from skilled therapy to address remaining functional deficits: Decreased positional tolerance    Progress toward goals / Updated goals:    See POC     PLAN  []   Upgrade activities as tolerated YES Continue plan of care   []   Discharge due to :    []   Other:      Therapist: Wenda Low  PT, DPT    Date: 01/20/2012 Time: 4:10 PM

## 2012-01-21 NOTE — Procedures (Signed)
[facility]  *** FINAL REPORT ***    Name: Bridget Morton, Bridget Morton  MRN: [id number]  DOB: 14 Oct 1955  Visit: [id number]  Date: 21 [name] 2012    TYPE OF TEST: Peripheral Venous Testing    REASON FOR TEST  Edema    Right Leg:-  Deep venous thrombosis:           No  Superficial venous thrombosis:    No  Deep venous insufficiency:        No  Superficial venous insufficiency: No    Left Leg:-  Deep venous thrombosis:           No  Superficial venous thrombosis:    No  Deep venous insufficiency:        No  Superficial venous insufficiency: No      INTERPRETATION/FINDINGS  Right leg :  1. Deep vein(s) visualized include the common femoral, deep femoral,  proximal femoral, mid femoral, distal femoral, popliteal(above knee),  popliteal(fossa), popliteal(below knee), posterior tibial and peroneal   veins.  2. No evidence of deep venous thrombosis detected in the veins  visualized.  Left leg :  1. Deep vein(s) visualized include the common femoral, deep femoral,  proximal femoral, mid femoral, distal femoral, popliteal(above knee),  popliteal(fossa), popliteal(below knee), posterior tibial and peroneal   veins.  2. No evidence of deep venous thrombosis detected in the veins  visualized.    ADDITIONAL COMMENTS    I have personally reviewed the data relevant to the interpretation of  this  study.    TECHNOLOGIST:  Lucretia D. Martin    PHYSICIAN:  Electronically signed by:  Holland Nickson MD  01/21/2012 03:05 PM

## 2012-01-21 NOTE — Procedures (Signed)
[facility]  *** FINAL REPORT ***    Name: Bridget Morton, Bridget Morton  MRN: [id number]  DOB: [birth year]  Visit: [id number]  Date: 21 [name] 2012    TYPE OF TEST: Peripheral Venous Testing    REASON FOR TEST  Edema    Right Leg:-  Deep venous thrombosis:           No  Superficial venous thrombosis:    No  Deep venous insufficiency:        No  Superficial venous insufficiency: No    Left Leg:-  Deep venous thrombosis:           No  Superficial venous thrombosis:    No  Deep venous insufficiency:        No  Superficial venous insufficiency: No      INTERPRETATION/FINDINGS  Right leg :  1. Deep vein(s) visualized include the common femoral, deep femoral,  proximal femoral, mid femoral, distal femoral, popliteal(above knee),  popliteal(fossa), popliteal(below knee), posterior tibial and peroneal   veins.  2. No evidence of deep venous thrombosis detected in the veins  visualized.  Left leg :  1. Deep vein(s) visualized include the common femoral, deep femoral,  proximal femoral, mid femoral, distal femoral, popliteal(above knee),  popliteal(fossa), popliteal(below knee), posterior tibial and peroneal   veins.  2. No evidence of deep venous thrombosis detected in the veins  visualized.    ADDITIONAL COMMENTS    I have personally reviewed the data relevant to the interpretation of  this  study.    TECHNOLOGIST:  Geoffery Lyons. Daphine Deutscher    PHYSICIAN:  Electronically signed by:  Lady Gary MD  01/21/2012 03:05 PM

## 2012-01-23 NOTE — Progress Notes (Signed)
Quick Note:    Please call patient and tell her that her stool studies were negative.  ______

## 2012-01-25 LAB — CULTURE, STOOL: E coli Shiga toxin: NEGATIVE

## 2012-01-28 NOTE — Progress Notes (Signed)
PHYSICAL THERAPY - DAILY TREATMENT NOTE    Patient Name: Bridget Morton        Date: 01/28/2012  DOB: 1956-04-23  26 YES Patient DOB Verified  Visit #:   2   of   12  Insurance: Payor: CIGNA  Plan: [facility] C[employer] HMO  Product Type: HMO       In time: 425 Out time: 450   Total Treatment Time: 25     TREATMENT AREA =  fibromyalgia    SUBJECTIVE  Pain Level (on 0 to 10 scale):  4  / 10   Medication Changes/New allergies or changes in medical history, any new surgeries or procedures?    NO    If yes, update Summary List   Subjective Functional Status/Changes:  []   No changes reported     Reports mild soreness in lumbar region with feelings of frustration after sitting at computer most of morning looking up recipes          OBJECTIVE  Modality rationale:      min []  Estim, type:                                          []   att     []   unatt     []   w/US     []   w/ice    []   w/heat    min []   Mechanical Traction: type/lbs                                               []   pro   []   sup   []   int   []   cont    min []   Ultrasound, settings/location:      min []   Iontophoresis:  []   take home patch w/ dexamethazone    min                                []   in clinic w/ dexamethazone    min []   Ice     []   Heat     position:     min []   Other:       min Manual Therapy:      25 min Therapeutic Exercise:  [x]   See flow sheet   []   Other:      [x]   Added:  PREs for core stabs, handout for return to walking program   to improve (function):    []   Changed:     to improve (function):       min Patient Education:  YES  Reviewed HEP   []   Progressed/Changed HEP based on:        Other Objective/Functional Measures:    Demonstrates increase in LBP since last visit. Advised in return to walking program and neutral spine mechanics with sitting posture to alleviate Lx s/s     Post Treatment Pain Level (on 0 to 10) scale:   2  / 10     ASSESSMENT  []   See Progress Note/Recertification   Patient will continue to benefit from skilled therapy to  address remaining functional deficits: Decrease positional tolerance   Progress toward  goals / Updated goals:    No changes made to STGs at this time     PLAN  [x]   Upgrade activities as tolerated YES Continue plan of care   []   Discharge due to :    []   Other:      Therapist: Wenda Low PT, DPT    Date: 01/28/2012 Time: 4:59 PM

## 2012-02-02 NOTE — Progress Notes (Signed)
PHYSICAL THERAPY - DAILY TREATMENT NOTE    Patient Name: Bridget Morton        Date: 02/02/2012  DOB: Dec 21, 1955   YES Patient DOB Verified  Visit #:   3   of   12  Insurance: Payor: CIGNA  Plan: [facility] CIGNA HMO  Product Type: HMO       In time: 340 Out time: 420   Total Treatment Time: 40     TREATMENT AREA =  fibromyalgia    SUBJECTIVE  Pain Level (on 0 to 10 scale):  7  / 10   Medication Changes/New allergies or changes in medical history, any new surgeries or procedures?    NO    If yes, update Summary List   Subjective Functional Status/Changes:  []   No changes reported     Reports pulling herbs, lifting potted plants and mopping floors since last visit.           OBJECTIVE  Modality rationale:      min []  Estim, type:                                          []   att     []   unatt     []   w/US     []   w/ice    []   w/heat    min []   Mechanical Traction: type/lbs                                               []   pro   []   sup   []   int   []   cont    min []   Ultrasound, settings/location:      min []   Iontophoresis:  []   take home patch w/ dexamethazone    min                                []   in clinic w/ dexamethazone    min []   Ice     []   Heat     position:     min []   Other:       min Manual Therapy:      30 min Therapeutic Exercise:  [x]   See flow sheet   []   Other:      []   Added:     to improve (function):    []   Changed:     to improve (function):      10 min Patient Education:  YES  Reviewed HEP   []   Progressed/Changed HEP based on:   Advised patient to set personal boundaries for household chores and other ADLs to limited "over stimulating"      Other Objective/Functional Measures:    Increased pain in lumbar region with tenderness along (B) lumbar paraspinals      Post Treatment Pain Level (on 0 to 10) scale:   5  / 10     ASSESSMENT  []   See Progress Note/Recertification   Patient will continue to benefit from skilled therapy to address remaining functional deficits: decreased adl tolerance    Progress toward goals / Updated goals  Tolerated exercises well without increase in s/s.  PLAN  [x]   Upgrade activities as tolerated YES Continue plan of care   []   Discharge due to :    []   Other:      Therapist: Wenda Low PT, DPT    Date: 02/02/2012 Time: 2:58 PM

## 2012-02-05 NOTE — Progress Notes (Signed)
PHYSICAL THERAPY - DAILY TREATMENT NOTE    Patient Name: Bridget Morton        Date: 02/05/2012  DOB: December 13, 1955   YES Patient DOB Verified  Visit #:   4   of   12  Insurance: Payor: CIGNA  Plan: [facility] CIGNA HMO  Product Type: HMO       In time: 1130 Out time: 1205   Total Treatment Time: 35     TREATMENT AREA =  Fibro    SUBJECTIVE  Pain Level (on 0 to 10 scale):  2  / 10   Medication Changes/New allergies or changes in medical history, any new surgeries or procedures?    NO    If yes, update Summary List   Subjective Functional Status/Changes:  []   No changes reported     Reports decrease in s/s since last visit. "I am feeling good today, but then again I feel better in the morning."          OBJECTIVE  Modality rationale:      min []  Estim, type:                                          []   att     []   unatt     []   w/US     []   w/ice    []   w/heat    min []   Mechanical Traction: type/lbs                                               []   pro   []   sup   []   int   []   cont    min []   Ultrasound, settings/location:      min []   Iontophoresis:  []   take home patch w/ dexamethazone    min                                []   in clinic w/ dexamethazone    min []   Ice     []   Heat     position:     min []   Other:       min Manual Therapy:      35 min Therapeutic Exercise:  [x]   See flow sheet   []   Other:      [x]   Added:  Active warm up, semi prone hip extension   to improve (function):    []   Changed:     to improve (function):       min Patient Education:  YES  Reviewed HEP   []   Progressed/Changed HEP based on:        Other Objective/Functional Measures:    Demonstrates lumbar rotation with semi prone hip extension with difficulty maintain neutral spine     Post Treatment Pain Level (on 0 to 10) scale:   2  / 10     ASSESSMENT  []   See Progress Note/Recertification   Patient will continue to benefit from skilled therapy to address remaining functional deficits: Decreased adl tol   Progress toward goals / Updated  goals:    Progressing with STGs, Monitor for response  to new exercises and progress core stabs as able.      PLAN  []   Upgrade activities as tolerated YES Continue plan of care   []   Discharge due to :    []   Other:      Therapist: Wenda Low PT, DPT    Date: 02/05/2012 Time: 12:08 PM

## 2012-02-09 NOTE — Progress Notes (Signed)
PHYSICAL THERAPY - DAILY TREATMENT NOTE    Patient Name: Bridget Morton        Date: 02/09/2012  DOB: Mar 31, 1956   YES Patient DOB Verified  Visit #:   5   of   12  Insurance: Payor: CIGNA  Plan: [facility] CIGNA HMO  Product Type: HMO       In time: 1130 Out time: 1205   Total Treatment Time: 35     TREATMENT AREA =  fibromyalgia    SUBJECTIVE  Pain Level (on 0 to 10 scale):  3  / 10   Medication Changes/New allergies or changes in medical history, any new surgeries or procedures?    NO    If yes, update Summary List   Subjective Functional Status/Changes:  []   No changes reported     Reports no increase in s/s since last visit. "My hands and arms are all that hurt this morning. If I could get them to move, I would feel better."          OBJECTIVE  Modality rationale:      min []  Estim, type:                                          []   att     []   unatt     []   w/US     []   w/ice    []   w/heat    min []   Mechanical Traction: type/lbs                                               []   pro   []   sup   []   int   []   cont    min []   Ultrasound, settings/location:      min []   Iontophoresis:  []   take home patch w/ dexamethazone    min                                []   in clinic w/ dexamethazone    min []   Ice     []   Heat     position:     min []   Other:       min Manual Therapy:      35 min Therapeutic Exercise:  [x]   See flow sheet   []   Other:      [x]   Added:  SB reverse curls    to improve (function):    []   Changed:     to improve (function):       min Patient Education:  YES  Reviewed HEP   []   Progressed/Changed HEP based on:        Other Objective/Functional Measures:    Decreased subjective c/o of pain at onset of visit. Good tolerance to exercises program and progression of exercises.      Post Treatment Pain Level (on 0 to 10) scale:   2  / 10     ASSESSMENT  []   See Progress Note/Recertification   Patient will continue to benefit from skilled therapy to address remaining functional deficits: Decreased ADLs  tolerance   Progress toward goals /  Updated goals:    Demonstrates FIQ score of 21pts from 51 pts, noting 30 pt difference      PLAN  []   Upgrade activities as tolerated YES Continue plan of care   []   Discharge due to :    []   Other:      Therapist: Wenda Low PT, DPT    Date: 02/09/2012 Time: 10:04 AM

## 2012-02-17 NOTE — Progress Notes (Signed)
Bridget Morton is a 56 y.o. female Pt here today for sinus infection.

## 2012-02-17 NOTE — Patient Instructions (Addendum)
Sinusitis: After Your Visit  Your Care Instructions  Sinusitis is an infection of the lining of the sinus cavities in your head. Sinusitis often follows a cold. It causes pain and pressure in your head and face.  In most cases, sinusitis gets better on its own in 1 to 2 weeks. But some mild symptoms may last for several weeks. Sometimes antibiotics are needed.  Follow-up care is a key part of your treatment and safety. Be sure to make and go to all appointments, and call your doctor if you are having problems. It's also a good idea to know your test results and keep a list of the medicines you take.  How can you care for yourself at home?  ?? Take an over-the-counter pain medicine, such as acetaminophen (Tylenol), ibuprofen (Advil, Motrin), or naproxen (Aleve). Read and follow all instructions on the label.  ?? If the doctor prescribed antibiotics, take them as directed. Do not stop taking them just because you feel better. You need to take the full course of antibiotics.  ?? Be careful when taking over-the-counter cold or flu medicines and Tylenol at the same time. Many of these medicines have acetaminophen, which is Tylenol. Read the labels to make sure that you are not taking more than the recommended dose. Too much acetaminophen (Tylenol) can be harmful.  ?? Breathe warm, moist air from a steamy shower, a hot bath, or a sink filled with hot water. Avoid cold, dry air. Using a humidifier in your home may help. Follow the directions for cleaning the machine.  ?? Use saline (saltwater) nasal washes to help keep your nasal passages open and wash out mucus and bacteria. You can buy saline nose drops at a grocery store or drugstore. Or you can make your own at home by adding 1 teaspoon of salt and 1 teaspoon of baking soda to 2 cups of distilled water. If you make your own, fill a bulb syringe with the solution, insert the tip into your nostril, and squeeze gently. Blow your nose.  ?? Put a hot, wet towel or a warm gel pack  on your face 3 or 4 times a day for 5 to 10 minutes each time.  ?? Try a decongestant nasal spray like oxymetazoline (Afrin). Do not use it for more than 3 days in a row. Using it for more than 3 days can make your congestion worse.  When should you call for help?  Call your doctor now or seek immediate medical care if:  ?? You have new or worse swelling or redness in your face or around your eyes.  ?? You have a new or higher fever.  Watch closely for changes in your health, and be sure to contact your doctor if:  ?? You have new or worse facial pain.  ?? The mucus from your nose becomes thicker (like pus) or has new blood in it.  ?? You are not getting better as expected.   Where can you learn more?   Go to http://www.healthwise.net/[facility]  Enter I933 in the search box to learn more about "Sinusitis: After Your Visit."   ?? 2006-2013 Healthwise, Incorporated. Care instructions adapted under license by [facility] (which disclaims liability or warranty for this information). This care instruction is for use with your licensed healthcare professional. If you have questions about a medical condition or this instruction, always ask your healthcare professional. Healthwise, Incorporated disclaims any warranty or liability for your use of this information.  Content Version: 9.8.193578;   Last Revised: Aug 27, 2011

## 2012-02-17 NOTE — Progress Notes (Signed)
Chief Complaint   Patient presents with   ??? Sinus Pain       SUBJECTIVE:   Bridget Morton is a 56 y.o. female who complains of being sick for 2 weeks.  Started with sore throat and hoarseness -> coughing.  That resolved.  But now having congestion, sinus pain and HA.  No f/c.  +sweating.    Review of Systems - ENT ROS: positive for - headaches, nasal congestion and sinus pain  negative for - sneezing  Respiratory ROS: no cough, shortness of breath, or wheezing  Cardiovascular ROS: no chest pain or dyspnea on exertion  Gastrointestinal ROS: no abdominal pain, change in bowel habits, or black or bloody stools  Musculoskeletal ROS: negative  Neurological ROS: negative    Physical Examination:   BP 130/80   Pulse 68   Temp(Src) 98 ??F (36.7 ??C) (Oral)   Resp 18   Ht 5\' 4"  (1.626 m)   Wt 209 lb (94.802 kg)   BMI 35.86 kg/m2   SpO2 96%  Body mass index is 35.86 kg/(m^2).    Constitutional: Well developed, nourished, no distress, alert   HENT: Exterior ears and tympanic membranes normal bilaterally. Supple neck. No thyromegaly or lymphadenopathy. Oropharynx clear and moist mucous membranes.  ++nasal inflammation.  +sinus tenderness bilat.   Eyes: Conjunctiva normal. PERRL.    CV: S1, S2.  RRR.  No murmurs/rubs.  No thrills palpated.  No carotid bruits.  Intact distal pulses.  No edema.   Pulm: No abnormalities on inspection.  Clear to auscultation bilaterally. No wheezing/rhonchi.  Normal effort.         Assessment/Plan  Acute sinusitis - augmentin x 7d.  otc decongestants.  Flu shot given today.    Orders Placed This Encounter   ??? amoxicillin-clavulanate (AUGMENTIN) 500-125 mg per tablet     Sig: Take 1 Tab by mouth two (2) times a day for 7 days.     Dispense:  14 Tab     Refill:  0           Kerman Passey, MD

## 2012-02-18 NOTE — Progress Notes (Signed)
PHYSICAL THERAPY - DAILY TREATMENT NOTE    Patient Name: Bridget Morton        Date: 02/18/2012  DOB: 10/23/1955   YES Patient DOB Verified  Visit #:   6   of   12  Insurance: Payor: CIGNA  Plan: [facility] CIGNA HMO  Product Type: HMO       In time: 320 Out time: 400   Total Treatment Time: 40     TREATMENT AREA =  Fibromyalgia    SUBJECTIVE  Pain Level (on 0 to 10 scale):  0  / 10   Medication Changes/New allergies or changes in medical history, any new surgeries or procedures?    NO    If yes, update Summary List   Subjective Functional Status/Changes:  []   No changes reported     Reports no increase in s/s since last visit. "I would feel better today, but this stupid sinus infection is killing me."          OBJECTIVE  Modality rationale:      min []  Estim, type:                                          []   att     []   unatt     []   w/US     []   w/ice    []   w/heat    min []   Mechanical Traction: type/lbs                                               []   pro   []   sup   []   int   []   cont    min []   Ultrasound, settings/location:      min []   Iontophoresis:  []   take home patch w/ dexamethazone    min                                []   in clinic w/ dexamethazone    min []   Ice     []   Heat     position:     min []   Other:       min Manual Therapy:      40 min Therapeutic Exercise:  [x]   See flow sheet   []   Other:      []   Added:     to improve (function):    []   Changed:     to improve (function):       min Patient Education:  YES  Reviewed HEP   []   Progressed/Changed HEP based on:        Other Objective/Functional Measures:    Demonstrates good tolerance to exercise program for lumbar s/s     Post Treatment Pain Level (on 0 to 10) scale:   0  / 10     ASSESSMENT  []   See Progress Note/Recertification   Patient will continue to benefit from skilled therapy to address remaining functional deficits: Decreased ADL tolerance   Progress toward goals / Updated goals:    Demonstrates improved pain levels, FIQ score and  tolerance to ADLs.      PLAN  [  x]  Upgrade activities as tolerated YES Continue plan of care   []   Discharge due to :    []   Other:      Therapist: Wenda Low PT, DPT    Date: 02/18/2012 Time: 2:54 PM

## 2012-02-23 NOTE — Progress Notes (Signed)
PHYSICAL THERAPY - DAILY TREATMENT NOTE    Patient Name: Bridget Morton        Date: 02/23/2012  DOB: 01/01/1956   YES Patient DOB Verified  Visit #:   7   of   12  Insurance: Payor: CIGNA  Plan: [facility] CIGNA HMO  Product Type: HMO       In time: 330 Out time: 400   Total Treatment Time: 30     TREATMENT AREA =  Fibro    SUBJECTIVE  Pain Level (on 0 to 10 scale):  0  / 10   Medication Changes/New allergies or changes in medical history, any new surgeries or procedures?    NO    If yes, update Summary List   Subjective Functional Status/Changes:  []   No changes reported     Reports no increase in s/s since last visit. "I am feeling pretty good today, my stomach is a little sore, from the meds they put me on for my sinus infection"          OBJECTIVE  Modality rationale:      min []  Estim, type:                                          []   att     []   unatt     []   w/US     []   w/ice    []   w/heat    min []   Mechanical Traction: type/lbs                                               []   pro   []   sup   []   int   []   cont    min []   Ultrasound, settings/location:      min []   Iontophoresis:  []   take home patch w/ dexamethazone    min                                []   in clinic w/ dexamethazone    min []   Ice     []   Heat     position:     min []   Other:       min Manual Therapy:      30 min Therapeutic Exercise:  [x]   See flow sheet   []   Other:      []   Added:     to improve (function):    []   Changed:     to improve (function):       min Patient Education:  YES  Reviewed HEP   []   Progressed/Changed HEP based on:        Other Objective/Functional Measures:    Demonstrates good tolerance for exercise program today     Post Treatment Pain Level (on 0 to 10) scale:   0  / 10     ASSESSMENT  []   See Progress Note/Recertification   Patient will continue to benefit from skilled therapy to address remaining functional deficits: Decreased ADL tolerance   Progress toward goals / Updated goals:    Progressing towards LTGs  with decrease in pain s/s  PLAN  [x]   Upgrade activities as tolerated YES Continue plan of care   []   Discharge due to :    []   Other:      Therapist: Wenda Low PT, DPT    Date: 02/23/2012 Time: 2:58 PM

## 2012-03-01 NOTE — Progress Notes (Signed)
PHYSICAL THERAPY - DAILY TREATMENT NOTE    Patient Name: Bridget Morton        Date: 03/01/2012  DOB: [birth year]   YES Patient DOB Verified  Visit #:   8   of   12  Insurance: Payor: CIGNA  Plan: [facility] CIGNA HMO  Product Type: HMO       In time: 340 Out time: 425   Total Treatment Time: 45     TREATMENT AREA =  Fibro    SUBJECTIVE  Pain Level (on 0 to 10 scale):  0-5  / 10   Medication Changes/New allergies or changes in medical history, any new surgeries or procedures?    NO    If yes, update Summary List   Subjective Functional Status/Changes:  []   No changes reported     Reports mild soreness in (B) knee due to going up and down stairs multiple times and change in arthritis meds          OBJECTIVE  Modality rationale:     min []  Estim, type:                                          []   att     []   unatt     []   w/US     []   w/ice    []   w/heat    min []   Mechanical Traction: type/lbs                                               []   pro   []   sup   []   int   []   cont    min []   Ultrasound, settings/location:      min []   Iontophoresis:  []   take home patch w/ dexamethazone    min                                []   in clinic w/ dexamethazone    min []   Ice     []   Heat     position:     min []   Other:       min Manual Therapy:      45 min Therapeutic Exercise:  [x]   See flow sheet   []   Other:      []   Added:     to improve (function):    []   Changed:     to improve (function):       min Patient Education:  YES  Reviewed HEP   []   Progressed/Changed HEP based on:        Other Objective/Functional Measures:    Demonstrates good tolerance to exercises program      Post Treatment Pain Level (on 0 to 10) scale:   0  / 10     ASSESSMENT  []   See Progress Note/Recertification   Patient will continue to benefit from skilled therapy to address remaining functional deficits: Decreased ADL tolerance   Progress toward goals / Updated goals:    Demonstrates FIQ score of 25 pts. Pt demonstrates ability to perform exercises  without verbal cueing and reports general increase in  tolerance to ADLs and leisure activities since onset of PT.      PLAN  []   Upgrade activities as tolerated YES Continue plan of care   [x]   Discharge due to : Completion of program    []   Other:      Therapist: Wenda Low PT, DPT    Date: 03/01/2012 Time: 3:28 PM

## 2012-03-01 NOTE — Progress Notes (Signed)
[facility] ??? [facility] PHYSICAL THERAPY  [address], [place], [place] [address] - Phone: [phone]  Fax: [phone]  []   PROGRESS NOTE  [x]   DISCHARGE SUMMARY  Patient Name: Bridget Morton DOB: Apr 02, 1956   Treatment Diagnosis: Myalgia and myositis, unspecified [729.1]     Referral Source: Kerman Passey, MD     Date of Initial Visit: 01/20/12 Attended Visits: 8 Missed Visits: 2     SUMMARY OF TREATMENT  Ther ex including strengthening, ROM, flexibility, stabilization; Patient education regarding relaxation techniques, diaphragmatic breathing, energy conservation and visualization techniques; HEP consisting of lumbar mobility and core stab exercises, diaphragmatic breathing  CURRENT STATUS  Bridget Morton has made excellent progress in therapy. Pain rated at 0/10. Score on the Fibromyalgia Impact Questionnaire has improved from 51 pts to 25 pts. Pt reports increase tolerance to ADLs and household activities.     Goal/Measure of Progress Goal Met?   1.  1. Decrease score on the Fibromyalgia Impact Questionnaire by > or = 14pts to demo an increase in functional activity tolerance.    Status at last Eval:  51 pts Current Status: 25pts yes   2.  2. Pt will note < or = 2/10 pain with all mobility to improve comfort with ADL???s.    Status at last Eval: 10/10 Current Status: 0/10 yes   3.  3. Pt will be able to demonstrates increase standing tolerance greater than 30 mins to perform ADLs   Status at last Eval: Less than 30 mins Current Status: Greater than 30 mins yes     RECOMMENDATIONS  Pt has been given HEP regarding con't of core stabilization, behavior modification, energy conservation techniques and relaxation techniques. Pt has completed duration of fibromyalgia program and has been released to HEP for self directed care.   Specifics:  Pt will DC'd at this time and instructed to f/u with your office as needed. Thank you.     If you have any questions/comments please  contact us directly at [phone].   Thank you for allowing [place] to assist in the care of your patient.    Therapist Signature: Wenda Low PT, DPT Date: 03/01/2012     Time: 4:28 PM   NOTE TO PHYSICIAN:  PLEASE COMPLETE THE ORDERS BELOW AND FAX TO   [facility]: [phone]  If you are unable to process this request in 24 hours please contact our office: [phone]    ___ I have read the above report and request that my patient continue as recommended.   ___ I have read the above report and request that my patient continue therapy with the following changes/special instructions:_________________________________________________________   ___ I have read the above report and request that my patient be discharged from therapy.     Physician Signature:        Date:       Time:

## 2012-04-06 DIAGNOSIS — S32009K Unspecified fracture of unspecified lumbar vertebra, subsequent encounter for fracture with nonunion: Secondary | ICD-10-CM | POA: Insufficient documentation

## 2012-04-06 DIAGNOSIS — M47816 Spondylosis without myelopathy or radiculopathy, lumbar region: Secondary | ICD-10-CM | POA: Insufficient documentation

## 2012-05-11 DIAGNOSIS — F5105 Insomnia due to other mental disorder: Secondary | ICD-10-CM | POA: Insufficient documentation

## 2012-05-11 NOTE — Patient Instructions (Signed)
Rhinitis: After Your Visit  Your Care Instructions  Rhinitis is swelling and irritation in the nose. Allergies and infections are often the cause. Your nose may run or feel stuffy. Other symptoms are itchy and sore eyes, ears, throat, and mouth.  If allergies are the cause, your doctor may do tests to find out what you are allergic to. You may be able to stop symptoms if you avoid the things that cause them. Your doctor may suggest or prescribe medicine to ease your symptoms.  Follow-up care is a key part of your treatment and safety. Be sure to make and go to all appointments, and call your doctor if you are having problems. It's also a good idea to know your test results and keep a list of the medicines you take.  How can you care for yourself at home?  ?? If your rhinitis is caused by allergies, try to find out what sets off (triggers) your symptoms. Take steps to avoid these triggers.  ?? Avoid yard work. It can stir up both pollen and mold.  ?? Do not smoke or allow others to smoke around you. If you need help quitting, talk to your doctor about stop-smoking programs and medicines. These can increase your chances of quitting for good.  ?? Do not use aerosol sprays, cleaning products, or perfumes.  ?? If pollen is one of your triggers, close your house and car windows during blooming season.  ?? Clean your house often to control dust.  ?? Keep pets outside.  ?? If your doctor recommends over-the-counter medicines to relieve symptoms, take your medicines exactly as prescribed. Call your doctor if you think you are having a problem with your medicine.  ?? Use saline (saltwater) nasal washes to help keep your nasal passages open and wash out mucus and bacteria. You can buy saline nose drops at a grocery store or drugstore. Or you can make your own at home by adding 1 teaspoon of salt and 1 teaspoon of baking soda to 2 cups of distilled water. If you make your own, fill a bulb syringe with the solution, insert the tip into  your nostril, and squeeze gently. Blow your nose.  When should you call for help?  Call your doctor now or seek immediate medical care if:  ?? You are having trouble breathing.  Watch closely for changes in your health, and be sure to contact your doctor if:  ?? Mucus from your nose gets thicker (like pus) or has new blood in it.  ?? You have new or worse symptoms.  ?? You do not get better as expected.   Where can you learn more?   Go to http://www.healthwise.net/[facility]  Enter M030 in the search box to learn more about "Rhinitis: After Your Visit."   ?? 2006-2013 Healthwise, Incorporated. Care instructions adapted under license by [place] (which disclaims liability or warranty for this information). This care instruction is for use with your licensed healthcare professional. If you have questions about a medical condition or this instruction, always ask your healthcare professional. Healthwise, Incorporated disclaims any warranty or liability for your use of this information.  Content Version: 9.9.209917; Last Revised: June 24, 2011

## 2012-05-11 NOTE — Progress Notes (Signed)
Bridget Morton is a 57 y.o. female Pt here today for follow up visit.

## 2012-05-11 NOTE — Progress Notes (Signed)
Bridget Morton is a 57 y.o. female and presents with Pain (Chronic) and Sinus Pain       Subjective:    Chronic pain - followed by Buford Dresser.  Finished PT program for fibromyalgia.  States she's having worse back pain after "pulling something" when she was unloading the dishwasher.  Buford Dresser put her on steroids, which she's finishing up.  Was referred to Dr. Mitchell/NS for eval for possible loose screws from prior fusion.  Has myelogram sch for later this week.    Pt also c/o sinus pain x 7d.  States her sinuses are burning and has PND.  Sinuses are tender to touch.  States her mouth feels raw.  Mild nasal congestion.  Was using flonase.  Pt states she's also noticed wheezing at night and mild dyspnea.  No h/o lung disease.    Pt also c/o BLE edema.  Is wearing TED hose.  Denies high salt intake.    ROS:  Constitutional: No recent weight change. No weakness/fatigue.  No f/c.   Skin: No rashes, change in nails/hair, itching   HENT: + HA, no dizziness. No hearing loss/tinnitus.  + nasal congestion/discharge.   Eyes: No change in vision, double/blurred vision or eye pain/redness.    Cardiovascular: No CP/palpitations.  No DOE/orthopnea/PND.   Respiratory: No cough/sputum, dyspnea, +wheezing.   Gastointestinal: No dysphagia, reflux.  No n/v.  No constipation/diarrhea.  No melena/rectal bleeding.   Allergy/Immunology: No seasonal rhinitis. Denies frequent colds, sinus/ear infections.   Neurological: No seizures/numbness/weakness.  No paresthesias.     The problem list was updated as a part of today's visit.  Patient Active Problem List   Diagnosis Code   ??? Internal hemorrhoid 455.0   ??? OA (osteoarthritis) 715.90   ??? Diarrhea 787.91   ??? Edema 782.3   ??? Adiposis dolorosa 272.8   ??? Chronic pain 338.29   ??? Iron deficiency 280.9   ??? Fibromyalgia 729.1     The PSH, FH were reviewed.      SH:  History   Substance Use Topics   ??? Smoking status: Former Smoker -- 10 years     Types: Cigarettes     Quit date: 12/16/2010   ???  Smokeless tobacco: Never Used   ??? Alcohol Use: Yes      Comment: seldom         Medications/Allergies:  Current Outpatient Prescriptions on File Prior to Visit   Medication Sig Dispense Refill   ??? traZODone (DESYREL) 100 mg tablet Take 1 Tab by mouth nightly.  90 Tab  0   ??? celecoxib (CELEBREX) 200 mg capsule Take  by mouth two (2) times a day.       ??? furosemide (LASIX) 20 mg tablet Take  by mouth daily.       ??? potassium chloride SR (KLOR-CON 8) 8 mEq tablet Take 8 mEq by mouth two (2) times a day.       ??? pregabalin (LYRICA) 100 mg capsule Take  by mouth two (2) times a day.       ??? oxyCODONE CR (OXYCODONE CR) 20 mg CR tablet Take  by mouth every twelve (12) hours.       ??? oxyCODONE IR (ROXICODONE) 10 mg Tab immediate release tablet Take  by mouth.       ??? montelukast (SINGULAIR) 10 mg tablet Take 10 mg by mouth daily.       ??? nystatin-triamcinolone (MYCOLOG II) topical cream Apply  to affected area two (2)  times a day.       ??? multivitamin (ONE A DAY) tablet Take 1 Tab by mouth daily.       ??? ferrous sulfate 325 mg (65 mg iron) tablet Take  by mouth Daily (before breakfast).       ??? calcium citrate-vitamin D3 (CITRACAL WITH VITAMIN D MAXIMUM) tablet Take 2 Tabs by mouth three (3) times daily.       ??? cyanocobalamin 1,000 mcg Subl by SubLINGual route.       ??? magnesium 500 mg Tab Take  by mouth.         No current facility-administered medications on file prior to visit.      No Known Allergies    Objective:  BP 100/80   Pulse 69   Temp(Src) 98.5 ??F (36.9 ??C) (Oral)   Resp 18   Ht 5\' 4"  (1.626 m)   Wt 215 lb (97.523 kg)   BMI 36.89 kg/m2   SpO2 98% Body mass index is 36.89 kg/(m^2).  Constitutional: Well developed, nourished, no distress, alert, obese habitus   HENT: Exterior ears and tympanic membranes normal bilaterally. Supple neck. No thyromegaly or lymphadenopathy. Oropharynx clear and moist mucous membranes.  ++nasal inflammation   Eyes: Conjunctiva normal. PERRL.    CV: S1, S2.  RRR.  2/6 SEM LUS and RUS  w/o radiation.  No thrills palpated.  No carotid bruits.  Intact distal pulses.  Tr edema.   Pulm: No abnormalities on inspection.  Clear to auscultation bilaterally. No wheezing/rhonchi.  Normal effort.     GI: Soft, nontender, nondistended.  Normal active bowel sounds.    Neuro: A/O x 3. No focal motor or sensory deficits. Speech normal.   Skin: No lesions/rashes on inspection.     Psych: Appropriate affect, judgement and insight.  Short-term memory intact.       Labwork and Ancillary Studies:    CBC w/Diff  Lab Results   Component Value Date/Time    WBC 4.8 12/16/2011 12:00 AM    HGB 12.0 12/16/2011 12:00 AM    PLATELET 192 12/16/2011 12:00 AM         Basic Metabolic Profile  Lab Results   Component Value Date/Time    Sodium 139 01/14/2012 12:00 AM    Potassium 4.5 01/14/2012 12:00 AM    Chloride 98 01/14/2012 12:00 AM    CO2 29 01/14/2012 12:00 AM    Glucose 85 01/14/2012 12:00 AM    BUN 20 01/14/2012 12:00 AM    Creatinine 0.66 01/14/2012 12:00 AM    BUN/Creatinine ratio 30 01/14/2012 12:00 AM    GFR est non-AA 100 01/14/2012 12:00 AM    Calcium 10.0 01/14/2012 12:00 AM    GFR est AA 115 01/14/2012 12:00 AM     Assessment/Plan:      Pamula was seen today for pain (chronic) and sinus pain.    Diagnoses and associated orders for this visit:    Rhinitis  - fluticasone (FLONASE) 50 mcg/actuation nasal spray; 2 Sprays by Both Nostrils route daily.  Refilled.  Continue.      Edema  - cut back on salt.  Cont ted hose daily.  Discussed most likely etiology is from venous insufficiency.    Insomnia  - traZODone (DESYREL) 100 mg tablet; Take 1 Tab by mouth nightly.  Refilled.      Health Maintenance:   Health Maintenance   Topic Date Due   ??? Influenza Vaccine  12/27/2012   ??? Breast Cancer Scrn Mammogram  11/12/2013   ??? Colon Cancer Scrn Colonoscopy  01/13/2017

## 2012-06-02 DIAGNOSIS — R400 Somnolence: Secondary | ICD-10-CM | POA: Insufficient documentation

## 2012-06-02 NOTE — Progress Notes (Signed)
Bridget Morton is a 57 y.o. female and presents with Rash     Subjective:    Pt with h/o BLE edema (neg ble pvl 12/2011) presents w/ worsening edema.  Also notes a rash on ble x 1 week.  Rash is not pruritic or painful.  Is gaining wt but states her diet hasn't changed.  Denies high salt diet and has been wearing TED hose as prescribed (even wears thigh high ones at night).  The pt does take oxaprozin regularly (previously on celebrex).  Taking lasix 20mg  daily w/o relief.  Does admit to DOE, no orthopnea or pnd.    ROS:  Negative except as mentioned above    SH:  History   Substance Use Topics   ??? Smoking status: Former Smoker -- 10 years     Types: Cigarettes     Quit date: 12/16/2010   ??? Smokeless tobacco: Never Used   ??? Alcohol Use: Yes      Comment: seldom         Medications/Allergies:  Current Outpatient Prescriptions on File Prior to Visit   Medication Sig Dispense Refill   ??? fluticasone (FLONASE) 50 mcg/actuation nasal spray 2 Sprays by Both Nostrils route daily.  1 Bottle  12   ??? traZODone (DESYREL) 100 mg tablet Take 1 Tab by mouth nightly.  90 Tab  3   ??? potassium chloride SR (KLOR-CON 8) 8 mEq tablet Take 8 mEq by mouth two (2) times a day.       ??? pregabalin (LYRICA) 100 mg capsule Take  by mouth two (2) times a day.       ??? oxyCODONE CR (OXYCODONE CR) 20 mg CR tablet Take  by mouth every twelve (12) hours.       ??? oxyCODONE IR (ROXICODONE) 10 mg Tab immediate release tablet Take  by mouth.       ??? montelukast (SINGULAIR) 10 mg tablet Take 10 mg by mouth daily.       ??? nystatin-triamcinolone (MYCOLOG II) topical cream Apply  to affected area two (2) times a day.       ??? multivitamin (ONE A DAY) tablet Take 1 Tab by mouth daily.       ??? ferrous sulfate 325 mg (65 mg iron) tablet Take  by mouth Daily (before breakfast).       ??? calcium citrate-vitamin D3 (CITRACAL WITH VITAMIN D MAXIMUM) tablet Take 2 Tabs by mouth three (3) times daily.       ??? cyanocobalamin 1,000 mcg Subl by SubLINGual route.        ??? magnesium 500 mg Tab Take  by mouth.         No current facility-administered medications on file prior to visit.      No Known Allergies    Objective:  BP 104/66   Pulse 81   Temp(Src) 97.9 ??F (36.6 ??C) (Oral)   Resp 18   Ht 5\' 4"  (1.626 m)   Wt 220 lb (99.791 kg)   BMI 37.74 kg/m2   SpO2 95% Body mass index is 37.74 kg/(m^2).  Constitutional: Well developed, nourished, no distress, alert   Eyes: Conjunctiva normal. PERRL.    CV: S1, S2.  RRR.  No murmurs/rubs.  No thrills palpated.  No carotid bruits.  Intact distal pulses. 2+ pitting edema     Pulm: No abnormalities on inspection.  Clear to auscultation bilaterally. No wheezing/rhonchi.  Normal effort.     Skin: bLE shins with blanchable erythematous  macules, no scale    Assessment/Plan:    BLE edma - worsening despite ted hose and lasix.  Hold oxaprozin (possible side effect of fluid retention).  Increase lasix to BID.  Monitor wt daily at home.  If no improvement, will refer to vascular surgery.  Could also be related to lyrica.    DOE and edema - ck echo.    Rash - unclear etiology. Doesn't appear cellulitic - will cover with bactrim anyway.  ?vascultic.  Referral to derm, pt to make appt.    Orders Placed This Encounter   ??? 2D ECHO COMPLETE ADULT (TTE)     Standing Status: Future      Number of Occurrences:       Standing Expiration Date: 11/30/2012     Order Specific Question:  Reason for Exam:     Answer:  edema, dyspnea on exertion   ??? oxaprozin (DAYPRO) 600 mg tablet     Sig: Take  by mouth two (2) times a day.   ??? furosemide (LASIX) 20 mg tablet     Sig: Take 1 Tab by mouth two (2) times a day.     Dispense:  60 Tab     Refill:  1   ??? trimethoprim-sulfamethoxazole (BACTRIM DS) 160-800 mg per tablet     Sig: Take 1 Tab by mouth two (2) times a day for 7 days.     Dispense:  14 Tab     Refill:  0

## 2012-06-02 NOTE — Progress Notes (Signed)
Bridget Morton is a 57 y.o. female  Pt here today for rash, edema in both legs.

## 2012-06-02 NOTE — Patient Instructions (Addendum)
Stop oxaprozin    Call and make derm appointment.

## 2012-06-11 NOTE — Progress Notes (Signed)
Patient armband removed and shredded

## 2012-06-14 DIAGNOSIS — I5189 Other ill-defined heart diseases: Secondary | ICD-10-CM | POA: Insufficient documentation

## 2012-07-23 NOTE — Progress Notes (Signed)
Reviewed record in preparation for visit and have obtained necessary documentation.

## 2012-07-26 DIAGNOSIS — H04123 Dry eye syndrome of bilateral lacrimal glands: Secondary | ICD-10-CM | POA: Insufficient documentation

## 2012-07-26 DIAGNOSIS — R252 Cramp and spasm: Secondary | ICD-10-CM | POA: Insufficient documentation

## 2012-07-26 DIAGNOSIS — K5909 Other constipation: Secondary | ICD-10-CM | POA: Insufficient documentation

## 2012-07-26 NOTE — Progress Notes (Signed)
Bridget Morton is a 57 y.o. female here for a follow up on a urgent care visit for a dog bite.

## 2012-07-26 NOTE — Progress Notes (Signed)
Bridget Morton is a 57 y.o. female and presents with Dog Bite       Subjective:    Had dog bite 3/13.  Pt seen at [facility], given antibiotic.  Was up to date with tetanus shot.  Was told to f/u with pcp b/c she may need plastic surgery referral.  No sutures were placed.  Has no weakness, minimal pain at site.  No f/c.  Wound seems to be healing well.    Pt also seen by derm - noted to have very dry skin and mouth.  Is getting referred to [facility] derm for adiposa dolorosa.  Recommended getting sjogren ab w/u.    Pt notes constipation despite regular use of colace + laxative.  Is on iron and oxycodone.  Notes fecal incontinence.  Has GI doctor.    Pt also c/o leg cramps and edema.  Echo and pvl were neg.  Pt states if she tries to do leg exercises, it makes the leg cramps worse.    ROS:  Negative except as mentioned above    SH:  History   Substance Use Topics   ??? Smoking status: Former Smoker -- 10 years     Types: Cigarettes     Quit date: 12/16/2010   ??? Smokeless tobacco: Never Used   ??? Alcohol Use: Yes      Comment: seldom         Medications/Allergies:  Current Outpatient Prescriptions on File Prior to Visit   Medication Sig Dispense Refill   ??? furosemide (LASIX) 20 mg tablet Take 1 Tab by mouth two (2) times a day.  60 Tab  1   ??? fluticasone (FLONASE) 50 mcg/actuation nasal spray 2 Sprays by Both Nostrils route daily.  1 Bottle  12   ??? potassium chloride SR (KLOR-CON 8) 8 mEq tablet Take 8 mEq by mouth two (2) times a day.       ??? pregabalin (LYRICA) 100 mg capsule Take  by mouth two (2) times a day.       ??? oxyCODONE IR (ROXICODONE) 10 mg Tab immediate release tablet Take  by mouth.       ??? montelukast (SINGULAIR) 10 mg tablet Take 10 mg by mouth daily.       ??? nystatin-triamcinolone (MYCOLOG II) topical cream Apply  to affected area two (2) times a day.       ??? multivitamin (ONE A DAY) tablet Take 1 Tab by mouth daily.       ??? ferrous sulfate 325 mg (65 mg iron) tablet Take  by mouth Daily (before breakfast).        ??? calcium citrate-vitamin D3 (CITRACAL WITH VITAMIN D MAXIMUM) tablet Take 2 Tabs by mouth three (3) times daily.       ??? cyanocobalamin 1,000 mcg Subl by SubLINGual route.       ??? magnesium 500 mg Tab Take  by mouth.         No current facility-administered medications on file prior to visit.      No Known Allergies    Objective:  BP 126/71   Pulse 68   Temp(Src) 97.9 ??F (36.6 ??C) (Oral)   Resp 16   Ht 5\' 4"  (1.626 m)   Wt 211 lb 12.8 oz (96.072 kg)   BMI 36.34 kg/m2 Body mass index is 36.34 kg/(m^2).  Constitutional: Well developed, nourished, no distress, alert   Eyes: Conjunctiva normal. PERRL.    CV: S1, S2.  RRR.  No  murmurs/rubs.  No thrills palpated.  No carotid bruits.  Intact distal pulses.  No edema.   Pulm: No abnormalities on inspection.  Clear to auscultation bilaterally. No wheezing/rhonchi.  Normal effort.     Skin: L middle finger with 1cm slow-healing laceration, puncture wounds on dorsum of hand    Assessment/Plan:    Dog bite with slow healing laceration - referral to hand surgery for 2nd opinion if secondary closure is warranted.  Appears to be healing well.  No obvious infection or skin deficit.    Arthritis and dry eyes and mouth - her dermatologist recommended testing for sjogrens given dry skin/eyes/mouth.  RF was neg 6 mos ago.  Is getting referred to [facility] derm for adiposa dolora.    Chronic constipation - declined trial of linzess.  Notes fecal incontinence.  Has been seen by GI, last seen 02/2012.     Leg cramps - ck cmp, mag.    Orders Placed This Encounter   ??? ANA, DIRECT, W/REFLEX   ??? SJOGREN'S ABS, SSA AND SSB   ??? METABOLIC PANEL, COMPREHENSIVE   ??? MAGNESIUM   ??? REFERRAL TO HAND SURGERY     Referral Priority:  Routine     Referral Type:  Consultation     Referral Reason:  Specialty Services Required

## 2012-07-26 NOTE — Patient Instructions (Addendum)
Muscle Cramps: After Your Visit  Your Care Instructions  A muscle cramp is when a muscle tightens up suddenly. It often occurs in the legs. A muscle cramp is also called a charley horse.  Muscle cramps usually last less than a minute. However, the pain may last for several minutes. Leg cramps that occur at night may wake you up.  Heavy exercise, dehydration, and being overweight can increase your risk of getting cramps. An imbalance of certain chemicals in your blood, called electrolytes, can also lead to muscle cramps. Pregnant women sometimes get muscle cramps during sleep.  Muscle cramps can be treated by stretching and massaging the muscle. If cramps keep coming back, your doctor may prescribe medicine that relaxes your muscles.  Follow-up care is a key part of your treatment and safety. Be sure to make and go to all appointments, and call your doctor if you are having problems. It???s also a good idea to know your test results and keep a list of the medicines you take.  How can you care for yourself at home?  ?? Drink plenty of fluids to prevent dehydration, enough so that your urine is light yellow or clear like water. Choose water and other caffeine-free clear liquids until you feel better. If you have kidney, heart, or liver disease and have to limit fluids, talk with your doctor before you increase the amount of fluids you drink.  ?? Stretch your muscles every day, especially before and after exercise and at bedtime. Regular stretching can relax your muscles and may prevent cramps.  ?? Do not suddenly increase the amount of exercise you get. Increase your exercise a little each week.  ?? When you get a cramp, stretch and massage the muscle. You can also take a warm shower or bath to relax the muscle. A heating pad placed on the muscle can also help.  ?? Take a daily multivitamin supplement.  ?? Take an over-the-counter pain medicine, such as acetaminophen (Tylenol), ibuprofen (Advil, Motrin), or naproxen (Aleve)  for cramps. Read and follow all instructions on the label.  ?? Take your medicines exactly as prescribed. Call your doctor if you have any problems with your medicine.  When should you call for help?  Watch closely for changes in your health, and be sure to contact your doctor if:  ?? You get muscle cramps often that do not go away after home treatment.  ?? Your muscle cramps often wake you up at night.  ?? You do not get better as expected.   Where can you learn more?   Go to http://www.healthwise.net/[facility]  Enter I565 in the search box to learn more about "Muscle Cramps: After Your Visit."   ?? 2006-2014 Healthwise, Incorporated. Care instructions adapted under license by [place] (which disclaims liability or warranty for this information). This care instruction is for use with your licensed healthcare professional. If you have questions about a medical condition or this instruction, always ask your healthcare professional. Healthwise, Incorporated disclaims any warranty or liability for your use of this information.  Content Version: 10.0.270728; Last Revised: March 18, 2010

## 2012-07-27 LAB — ANA, DIRECT, W/REFLEX
ANA, DIRECT: NEGATIVE
Antinuclear Antibodies Direct: NEGATIVE

## 2012-07-27 LAB — METABOLIC PANEL, COMPREHENSIVE
A-G Ratio: 1.9 (ref 1.1–2.5)
ALT (SGPT): 28 IU/L (ref 0–32)
AST (SGOT): 29 IU/L (ref 0–40)
Albumin: 4 g/dL (ref 3.5–5.5)
Alk. phosphatase: 66 IU/L (ref 39–117)
BUN/Creatinine ratio: 22 (ref 9–23)
BUN: 21 mg/dL (ref 6–24)
Bilirubin, total: 0.2 mg/dL (ref 0.0–1.2)
CO2: 31 mmol/L — ABNORMAL HIGH (ref 19–28)
Calcium: 8.9 mg/dL (ref 8.7–10.2)
Chloride: 100 mmol/L (ref 97–108)
Creatinine: 0.95 mg/dL (ref 0.57–1.00)
GFR est AA: 78 mL/min/{1.73_m2} (ref >59)
GFR est non-AA: 68 mL/min/{1.73_m2} (ref >59)
GLOBULIN, TOTAL: 2.1 g/dL (ref 1.5–4.5)
Glucose: 77 mg/dL (ref 65–99)
Potassium: 4.3 mmol/L (ref 3.5–5.2)
Protein, total: 6.1 g/dL (ref 6.0–8.5)
Sodium: 144 mmol/L (ref 134–144)

## 2012-07-27 LAB — SJOGREN'S ABS, SSA AND SSB
Sjogren's Anti-SS-A: <0.2 AI (ref 0.0–0.9)
Sjogren's Anti-SS-B: <0.2 AI (ref 0.0–0.9)

## 2012-07-27 LAB — MAGNESIUM: Magnesium: 2 mg/dL (ref 1.6–2.6)

## 2012-07-28 NOTE — Progress Notes (Signed)
Quick Note:    Please call patient and tell her that labs are all normal. antibodies were negative. Electrolytes were normal.  ______

## 2012-08-03 ENCOUNTER — Encounter

## 2012-08-03 NOTE — Telephone Encounter (Signed)
Message copied by Leilani Able on Tue Aug 03, 2012 11:26 AM  ------       Message from: Luanne Bras       Created: Tue Aug 03, 2012 10:23 AM       Regarding: Prescription Question       Contact: [phone]         [facility] was not able to refill my prescription for FUROSEMIDE 20MG  TABLETS due to physician approval.  Please send a new prescription refill to [facility] on [address]              Thank you,       Lisbeth Ply  ------

## 2012-08-05 NOTE — Progress Notes (Signed)
Bridget Morton is a 56 y.o. female and presents for pre-operative evaluation.    Subjective:  This patient was seen at the request of Dr. Clovis Riley for pre-operative evaluation for planned procedure: removal of old hardware, redo of L4-5 with possible L3 fusion on 08/23/12 under general anesthesia.    Cardiac factors include:  none    METs: 4    ROS:  Constitutional: No recent weight change. No weakness/fatigue.  No f/c.   Skin: No rashes, change in nails/hair, itching   HENT: No HA, dizziness. No hearing loss/tinnitus.  No nasal congestion/discharge.   Eyes: No change in vision, double/blurred vision or eye pain/redness.    Cardiovascular: No CP/palpitations.  No DOE/orthopnea/PND.   Respiratory: No cough/sputum, dyspnea, wheezing.   Gastointestinal: No dysphagia, reflux.  No n/v.  No constipation/diarrhea.  No melena/rectal bleeding.   Genitourinary: No dysuria, urinary hesitancy, nocturia, hematuria.  No incontinence.   Musculoskeletal: No joint pain/stiffness.  No muscle pain/tenderness.   Heme: No h/o anemia.  No easy bleeding/bruising.   Neurological: No seizures/numbness/weakness.  No paresthesias.     PMH:  Patient Active Problem List   Diagnosis Code   ??? Internal hemorrhoid 455.0   ??? OA (osteoarthritis) 715.90   ??? Edema 782.3   ??? Adiposis dolorosa 272.8   ??? Chronic pain 338.29   ??? Iron deficiency 280.9   ??? Fibromyalgia 729.1   ??? Insomnia 780.52   ??? Daytime somnolence 780.54   ??? Diastolic dysfunction 429.9   ??? Chronic constipation 564.00   ??? Dry eyes, bilateral 375.15   ??? Leg cramps 729.82       PSH:  Past Surgical History   Procedure Laterality Date   ??? Hx gyn     ??? Hx partial hysterectomy  2000     supracervical   ??? Hx orthopaedic       L4-L5 fusion   ??? Hx knee replacment       bilateral   ??? Hx rotator cuff repair       x 3 on right   ??? Hx gastric bypass  2004   ??? Hx appendectomy     ??? Hx tonsil and adenoidectomy       childhood   ??? Hx cesarean section  1978, 1982        SH:  History   Substance Use  Topics   ??? Smoking status: Former Smoker -- 10 years     Types: Cigarettes     Quit date: 12/16/2010   ??? Smokeless tobacco: Never Used   ??? Alcohol Use: Yes      Comment: seldom       FH:  Family History   Problem Relation Age of Onset   ??? Alcohol abuse Mother    ??? Cancer Mother      breast   ??? Cancer Father      lung   ??? Diabetes Father    ??? Heart Disease Father    ??? Lung Disease Father    ??? Hypertension Father    ??? Cancer Brother      lung       Medications/Allergies:  Current Outpatient Prescriptions on File Prior to Visit   Medication Sig Dispense Refill   ??? furosemide (LASIX) 20 mg tablet Take 1 Tab by mouth two (2) times a day.  60 Tab  0   ??? celecoxib (CELEBREX) 200 mg capsule Take 1 Cap by mouth two (2) times a day.  60 Cap  2   ??? fluticasone (FLONASE) 50 mcg/actuation nasal spray 2 Sprays by Both Nostrils route daily.  1 Bottle  12   ??? pregabalin (LYRICA) 100 mg capsule Take  by mouth two (2) times a day.       ??? montelukast (SINGULAIR) 10 mg tablet Take 10 mg by mouth daily.       ??? nystatin-triamcinolone (MYCOLOG II) topical cream Apply  to affected area two (2) times a day.       ??? multivitamin (ONE A DAY) tablet Take 1 Tab by mouth daily.       ??? ferrous sulfate 325 mg (65 mg iron) tablet Take  by mouth Daily (before breakfast).       ??? calcium citrate-vitamin D3 (CITRACAL WITH VITAMIN D MAXIMUM) tablet Take 2 Tabs by mouth three (3) times daily.       ??? cyanocobalamin 1,000 mcg Subl by SubLINGual route.       ??? magnesium 500 mg Tab Take  by mouth.       ??? potassium chloride SR (KLOR-CON 8) 8 mEq tablet Take 8 mEq by mouth two (2) times a day.       ??? oxyCODONE IR (ROXICODONE) 10 mg Tab immediate release tablet Take  by mouth.         No current facility-administered medications on file prior to visit.      No Known Allergies    Objective:  BP 128/78   Temp(Src) 97.9 ??F (36.6 ??C) (Oral)   Resp 21   Ht 5\' 4"  (1.626 m)   Wt 208 lb (94.348 kg)   BMI 35.69 kg/m2 Body mass index is 35.69 kg/(m^2).  Gen: Well  developed, nourished, no distress, alert, obese habitus   HENT: Exterior ears and tympanic membranes normal bilaterally. Supple neck. No thyromegaly or lymphadenopathy. Oropharynx clear and moist mucous membranes.     Eyes: Conjunctiva normal. PERRL.    CV: S1, S2.  RRR.  No murmurs/rubs.  No thrills palpated.  No carotid bruits.  Intact distal pulses.  No edema.   Pulm: No abnormalities on inspection.  Clear to auscultation bilaterally. No wheezing/rhonchi.  Normal effort.     GI: Soft, nontender, nondistended.  Normal active bowel sounds.   MS: Gait normal.  Normal ROM all extremities.     Neuro: A/O x 3.  no focal motor or sensory deficits. Speech normal.   Psych: Appropriate affect, judgement and insight.  Short-term memory intact.       Labwork and Ancillary Studies:   Na 143  K 4.1  Cl 101  CO2 31  BUN 22  Cr 0.8    WBC 4.7  Hgb 12.2  Hct 38.1  Plt 206    EKG:NSR, no ST changes.    Assessment/Plan:    The patient is low risk for planned procedure and may proceed to OR without further testing.    The patient has a risk for pulmonary failure.  Aggressive pulmonary toilet post-operatively is recommended.     The following recommendations were made for medication regimen prior to surgery:  Hold NSAIDs 3 days prior to surgery.

## 2012-08-05 NOTE — Progress Notes (Signed)
Bridget Morton is a 57 y.o. female here today for pre op clearance,

## 2012-08-10 NOTE — Progress Notes (Signed)
Quick Note:    Pt notified.  ______

## 2012-08-23 DIAGNOSIS — M4310 Spondylolisthesis, site unspecified: Secondary | ICD-10-CM | POA: Insufficient documentation

## 2012-08-23 DIAGNOSIS — IMO0002 Reserved for concepts with insufficient information to code with codable children: Secondary | ICD-10-CM | POA: Insufficient documentation

## 2012-08-23 DIAGNOSIS — M48061 Spinal stenosis, lumbar region without neurogenic claudication: Secondary | ICD-10-CM | POA: Insufficient documentation

## 2012-11-30 NOTE — Addendum Note (Signed)
Addended byDaine Gravel on: 11/30/2012 12:38 PM     Modules accepted: Orders

## 2012-11-30 NOTE — Telephone Encounter (Signed)
Szulc pt.

## 2013-02-17 NOTE — Telephone Encounter (Signed)
My chart message sent to patient.

## 2013-02-17 NOTE — Telephone Encounter (Signed)
Received a mychart request for pt regarding Do you know if I still need to do the stool culture that the e-mail I received from [facility] was referring to?

## 2013-03-10 NOTE — Progress Notes (Signed)
Bridget Morton is a 57 y.o. female and presents with Follow-up       Subjective:    Pt c/o extreme fatigue and daytime somnolence x 1 mo.  States she doesn't awaken refreshed.  States if she just eats, she will have to take a nap b/c she is so tired.  Also notes dry skin, hair loss.  Occ snores.  No nocturia.  Sleeping is fragmented.  Tends to fall asleep after dinner for an hour.  Then will wake up.  States she falls asleep sitting up, and doesn't even know she has fallen asleep.    Pt notes nausea with eating.  No vomiting.  Also notes epigastric pain.  Has constipation.  Has been taking laxatives and suppositories w/o issues.  Stools are hard and round.  Taking probiotics.  No appetite. +reflux.  Also states she's having trouble eating.  States she has trouble coordinating the swallow.      Notes depression.  States she just feels down.  States she feels like her medical problems are for a 57yo.    ROS:  Constitutional: No recent weight change. No weakness/+fatigue.  No f/c.   Skin: No rashes, +change in nails/hair, itching   HENT: No HA, dizziness. No hearing loss/tinnitus.  No nasal congestion/discharge.   Eyes: No change in vision, double/blurred vision or eye pain/redness.    Cardiovascular: No CP/palpitations.  No DOE/orthopnea/PND.   Respiratory: No cough/sputum, dyspnea, wheezing.   Gastointestinal: No dysphagia, +reflux.  + nausea, no vomiting.  + constipation.  No melena/rectal bleeding.   Genitourinary: No dysuria, urinary hesitancy, nocturia, hematuria.  No incontinence.   Musculoskeletal: + joint pain/stiffness.  No muscle pain/tenderness.   Endo: No heat/cold intolerance, no polyuria/polydypsia.   Heme: No h/o anemia.  No easy bleeding/bruising.   Neurological: No seizures/numbness/weakness.  No paresthesias.   Psychiatric:  + depression, no anxiety.     The problem list was updated as a part of today's visit.  Patient Active Problem List   Diagnosis Code   ??? Internal hemorrhoid 455.0   ??? OA  (osteoarthritis) 715.90   ??? Edema 782.3   ??? Adiposis dolorosa 272.8   ??? Chronic pain 338.29   ??? Iron deficiency 280.9   ??? Fibromyalgia 729.1   ??? Insomnia 780.52   ??? Daytime somnolence 780.54   ??? Diastolic dysfunction 429.9   ??? Chronic constipation 564.00   ??? Dry eyes, bilateral 375.15   ??? Leg cramps 729.82       The PSH, FH were reviewed.      SH:  History   Substance Use Topics   ??? Smoking status: Former Smoker -- 10 years     Types: Cigarettes     Quit date: 12/16/2010   ??? Smokeless tobacco: Never Used   ??? Alcohol Use: Yes      Comment: seldom         Medications/Allergies:  Current Outpatient Prescriptions on File Prior to Visit   Medication Sig Dispense Refill   ??? potassium chloride SR (KLOR-CON 8) 8 mEq tablet Take 1 tablet by mouth two (2) times a day.  60 tablet  12   ??? furosemide (LASIX) 20 mg tablet Take 1 Tab by mouth two (2) times a day.  60 Tab  3   ??? oxyCODONE IR (ROXICODONE) 15 mg immediate release tablet Take 15 mg by mouth every four (4) hours as needed for Pain.       ??? celecoxib (CELEBREX) 200 mg capsule  Take 1 Cap by mouth two (2) times a day.  60 Cap  2   ??? fluticasone (FLONASE) 50 mcg/actuation nasal spray 2 Sprays by Both Nostrils route daily.  1 Bottle  12   ??? pregabalin (LYRICA) 100 mg capsule Take  by mouth two (2) times a day.       ??? montelukast (SINGULAIR) 10 mg tablet Take 10 mg by mouth daily.       ??? nystatin-triamcinolone (MYCOLOG II) topical cream Apply  to affected area two (2) times a day.       ??? multivitamin (ONE A DAY) tablet Take 1 Tab by mouth daily.       ??? ferrous sulfate 325 mg (65 mg iron) tablet Take  by mouth Daily (before breakfast).       ??? calcium citrate-vitamin D3 (CITRACAL WITH VITAMIN D MAXIMUM) tablet Take 2 Tabs by mouth three (3) times daily.       ??? cyanocobalamin 1,000 mcg Subl by SubLINGual route.       ??? magnesium 500 mg Tab Take  by mouth.         No current facility-administered medications on file prior to visit.          No Known Allergies    Objective:   BP 134/73   Pulse 69   Resp 14   Ht 5\' 4"  (1.626 m)   Wt 197 lb 6.4 oz (89.54 kg)   BMI 33.87 kg/m2   SpO2 94%   Constitutional: Well developed, nourished, no distress, alert, obese habitus   HENT: Exterior ears and tympanic membranes normal bilaterally. Supple neck. No thyromegaly or lymphadenopathy. Oropharynx clear and moist mucous membranes.     Eyes: Conjunctiva normal. PERRL.    CV: S1, S2.  RRR.  No murmurs/rubs.  No thrills palpated.  No carotid bruits.  Intact distal pulses.  No edema.   Pulm: No abnormalities on inspection.  Clear to auscultation bilaterally. No wheezing/rhonchi.  Normal effort.     GI: Soft, nontender, nondistended.  Normal active bowel sounds.    Neuro: A/O x 3. No focal motor or sensory deficits. Speech normal.   Psych: Appropriate affect, judgement and insight.  Short-term memory intact.       Labwork and Ancillary Studies:    CBC w/Diff  Lab Results   Component Value Date/Time    WBC 4.8 12/16/2011 12:00 AM    HGB 12.0 12/16/2011 12:00 AM    PLATELET 192 12/16/2011 12:00 AM         Basic Metabolic Profile  Lab Results   Component Value Date/Time    Sodium 144 07/26/2012 12:00 AM    Potassium 4.3 07/26/2012 12:00 AM    Chloride 100 07/26/2012 12:00 AM    CO2 31 07/26/2012 12:00 AM    Glucose 77 07/26/2012 12:00 AM    BUN 21 07/26/2012 12:00 AM    Creatinine 0.95 07/26/2012 12:00 AM    BUN/Creatinine ratio 22 07/26/2012 12:00 AM    GFR est non-AA 68 07/26/2012 12:00 AM    Calcium 8.9 07/26/2012 12:00 AM    GFR est AA 78 07/26/2012 12:00 AM      Assessment/Plan:      Vaeth was seen today for follow-up.    Diagnoses and associated orders for this visit:    Fatigue, Daytime somnolence -poor sleep hygeine  - TSH RFX ON ABNORMAL TO FREE T4  - CBC W/O DIFF  - SLEEP MEDICINE REFERRAL    Epigastric pain -  in setting of NSAID use.  ddx includes gastritis vs pud vs h.pylori  - H PYLORI AB, IGG, QT  - REFERRAL TO GASTROENTEROLOGY    Constipation  - docusate sodium (COLACE) 100 mg capsule; Take 1 capsule by  mouth two (2) times a day.  - FIBER 2 gram chew; Take  by mouth.  - Drink plenty of water    Depression, Fibromyalgia, Chronic pain  - venlafaxine (EFFEXOR) 37.5 mg tablet; Take 1 tablet by mouth two (2) times a day.  - F/u in 73mo  - Pt has tried cymbalta in past but had side effects.    GERD (gastroesophageal reflux disease) and Dysphagia  - H PYLORI AB, IGG, QT  - esomeprazole (NEXIUM) 40 mg capsule; Take 1 capsule by mouth daily. Switch from protonix.  - REFERRAL TO GASTROENTEROLOGY    Health Maintenance:   Health Maintenance   Topic Date Due   ??? Tdap Age > 18  10/15/1975   ??? Td Q 10 Yrs Age > 18  10/15/1975   ??? Breast Cancer Scrn Mammogram  11/12/2013   ??? Influenza Age 34 To Adult  11/26/2013   ??? Colonoscopy  07/14/2018

## 2013-03-10 NOTE — Progress Notes (Signed)
Bridget Morton is a 57 y.o. female here today for follow up. 1. Have you been to the ER, urgent care clinic since your last visit?  Hospitalized since your last visit?No    2. Have you seen or consulted any other health care providers outside of the [facility] since your last visit?  Include any pap smears or colon screening. Yes Yes Pt states she has seen pain doctor and two surgeons.

## 2013-03-10 NOTE — Patient Instructions (Addendum)
Constipation: After Your Visit  Your Care Instructions  Constipation means that you have a hard time passing stools (bowel movements). People pass stools from 3 times a day to once every 3 days. What is normal for you may be different. Constipation may occur with pain in the rectum and cramping. The pain may get worse when you try to pass stools. Sometimes there are small amounts of bright red blood on toilet paper or the surface of stools. This is because of enlarged veins near the rectum (hemorrhoids).  A few changes in your diet and lifestyle may help you avoid ongoing constipation. Your doctor may also prescribe medicine to help loosen your stool.  Some medicines can cause constipation. These include pain medicines and antidepressants. Tell your doctor about all the medicines you take. Your doctor may want to make a medicine change to ease your symptoms.  Follow-up care is a key part of your treatment and safety. Be sure to make and go to all appointments, and call your doctor if you are having problems. It's also a good idea to know your test results and keep a list of the medicines you take.  How can you care for yourself at home?  ?? Drink plenty of fluids, enough so that your urine is light yellow or clear like water. If you have kidney, heart, or liver disease and have to limit fluids, talk with your doctor before you increase the amount of fluids you drink.  ?? Include high-fiber foods in your diet each day. These include fruits, vegetables, beans, and whole grains.  ?? Get at least 30 minutes of exercise on most days of the week. Walking is a good choice. You also may want to do other activities, such as running, swimming, cycling, or playing tennis or team sports.  ?? Take a fiber supplement, such as Citrucel or Metamucil, every day. Start with a small dose and very slowly increase the dose over a month or more.  ?? Schedule time each day for a bowel movement. A daily routine may help. Take your time having  your bowel movement.  ?? Support your feet with a small step stool when you sit on the toilet. This helps flex your hips and places your pelvis in a squatting position.  ?? Your doctor may recommend an over-the-counter laxative to relieve your constipation. Examples are Milk of Magnesia and MiraLax. Read and follow all instructions on the label. Do not use laxatives on a long-term basis.  When should you call for help?  Call your doctor now or seek immediate medical care if:  ?? You have new or worse belly pain.  ?? You have new or worse nausea or vomiting.  ?? You have blood in your stools.  Watch closely for changes in your health, and be sure to contact your doctor if:  ?? Your constipation is getting worse.  ?? You do not get better as expected.   Where can you learn more?   Go to http://www.healthwise.net/[facility]  Enter P343 in the search box to learn more about "Constipation: After Your Visit."   ?? 2006-2014 Healthwise, Incorporated. Care instructions adapted under license by [place] (which disclaims liability or warranty for this information). This care instruction is for use with your licensed healthcare professional. If you have questions about a medical condition or this instruction, always ask your healthcare professional. Healthwise, Incorporated disclaims any warranty or liability for your use of this information.  Content Version: 10.2.346038; Current as of: September 29, 2012

## 2013-03-12 LAB — TSH RFX ON ABNORMAL TO FREE T4: TSH: 4.55 u[IU]/mL — ABNORMAL HIGH (ref 0.450–4.500)

## 2013-03-12 LAB — CBC W/O DIFF
HCT: 38.3 % (ref 34.0–46.6)
HGB: 12.8 g/dL (ref 11.1–15.9)
MCH: 27.4 pg (ref 26.6–33.0)
MCHC: 33.4 g/dL (ref 31.5–35.7)
MCV: 82 fL (ref 79–97)
PLATELET: 161 10*3/uL (ref 150–379)
RBC: 4.68 x10E6/uL (ref 3.77–5.28)
RDW: 15.5 % — ABNORMAL HIGH (ref 12.3–15.4)
WBC: 4.3 10*3/uL (ref 3.4–10.8)

## 2013-03-12 LAB — T4F: T4,Free (Direct): 1.23 ng/dL (ref 0.82–1.77)

## 2013-03-12 LAB — H PYLORI AB, IGG, QT: H. pylori Ab, IgG: <0.9 U/mL (ref 0.0–0.8)

## 2013-03-14 ENCOUNTER — Encounter

## 2013-03-14 NOTE — Progress Notes (Signed)
Quick Note:    [employer]- needs repeat TSH  ______

## 2013-03-24 LAB — TSH RFX ON ABNORMAL TO FREE T4: TSH: 3.17 u[IU]/mL (ref 0.450–4.500)

## 2013-03-25 NOTE — Progress Notes (Signed)
Quick Note:    mychart message sent    ______

## 2013-05-11 NOTE — Telephone Encounter (Signed)
rx called in.

## 2013-05-18 DIAGNOSIS — R1312 Dysphagia, oropharyngeal phase: Secondary | ICD-10-CM | POA: Insufficient documentation

## 2013-05-20 NOTE — Patient Instructions (Addendum)
How to Prepare for Surgery  How do you prepare for surgery?  Surgery can be stressful. This information will help you understand what you can expect. And it will help you safely prepare for surgery.  Follow-up care is a key part of your treatment and safety. Be sure to make and go to all appointments, and call your doctor if you are having problems. It's also a good idea to know your test results and keep a list of the medicines you take.  What happens before surgery?  Preparing for surgery  ?? Understand exactly what surgery is planned, along with the risks, benefits, and other options.  ?? Tell your doctors ALL the medicines, vitamins, supplements, and herbal remedies you take. Some of these can increase the risk of bleeding or interact with anesthesia.  ?? If you take blood thinners, such as warfarin (Coumadin), clopidogrel (Plavix), or aspirin, be sure to talk to your doctor. He or she will tell you if you should stop taking these medicines before your surgery. Make sure that you understand exactly what your doctor wants you to do.  ?? Your doctor will tell you which medicines to take or stop before your surgery. You may need to stop taking certain medicines a week or more before surgery. So talk to your doctor as soon as you can.  ?? If you have an advance directive, let your doctor know. It may include a living will and a durable power of attorney for health care. Bring a copy to the hospital. If don't have one, you may want to prepare one. It lets your doctor and loved ones know your health care wishes. Doctors advise that everyone prepare these papers before any type of surgery or procedure.  What happens on the day of surgery?  ?? Follow the instructions about when to stop eating and drinking. If you don't, your surgery may be canceled. If your doctor told you to take your medicines on the day of surgery, take them with only a sip of water.  ?? Take a bath or shower before coming in for your surgery. Do not apply  lotions, perfumes, deodorants, or nail polish.  ?? Do not shave the surgical site yourself.  ?? Take off all jewelry and piercings. And take out contact lenses, if you wear them.  At the hospital or surgery center  ?? Bring a picture ID.  ?? The area for surgery is often marked to make sure there are no errors.  ?? You will be kept comfortable and safe by your anesthesia provider. The anesthesia may make you sleep. Or it may just numb the area being worked on.  Going home  ?? Be sure you have someone to drive you home. Anesthesia and pain medicine make it unsafe for you to drive.  ?? You will be given more specific instructions about recovering from your surgery. They will cover things like diet, wound care, follow-up care, driving, and getting back to your normal routine.  When should you call your doctor?  ?? You have questions or concerns.  ?? You don't understand how to prepare for your surgery.  ?? You become ill before the surgery (such as fever, flu, or a cold).  ?? You need to reschedule or have changed your mind about having the surgery.   Where can you learn more?   Go to [employer]huhttp://www.healthwise.net/[facility]  Enter Q270 in the search box to learn more about "How to Prepare for Surgery."   ?? 2006-2014 Healthwise,  In[employer]. Care instructions adapted under license by [place] (which disclaims liability or warranty for this information). This care instruction is for use with your licensed healthcare professional. If you have questions about a medical condition or this instruction, always ask your healthcare professional. Healthwise, Incorporated disclaims any warranty or liability for your use of this information.  Content Version: 10.2.346038; Current as of: Sep 15, 2011

## 2013-05-20 NOTE — Progress Notes (Signed)
Bridget Morton is a 58 y.o. female and presents for pre-operative evaluation.    Subjective:  This patient was seen at the request of Dr Sonny MastersManke for pre-operative evaluation for planned procedure: R reverse total shoulder arthroplasty with deltoid repair on 06/03/13 under general and regional anesthesia.    Cardiac factors include:  none    METs: 4    ROS:  Constitutional: No recent weight change. No weakness/fatigue.  No f/c.   Skin: No rashes, change in nails/hair, itching   HENT: No HA, dizziness. No hearing loss/tinnitus.  No nasal congestion/discharge.   Eyes: No change in vision, double/blurred vision or eye pain/redness.    Cardiovascular: No CP/palpitations.  No DOE/orthopnea/PND.   Respiratory: No cough/sputum, dyspnea, wheezing.   Gastointestinal: No dysphagia, reflux.  No n/v.  No constipation/diarrhea.  No melena/rectal bleeding.   Genitourinary: No dysuria, urinary hesitancy, nocturia, hematuria.  No incontinence.   Musculoskeletal: + joint pain.  + muscle pain/tenderness.   Heme: No h/o anemia.  No easy bleeding/bruising.   Neurological: No seizures/numbness/weakness.  No paresthesias.     PMH:  Patient Active Problem List   Diagnosis Code   ??? Internal hemorrhoid 455.0   ??? OA (osteoarthritis) 715.90   ??? Edema 782.3   ??? Adiposis dolorosa 272.8   ??? Chronic pain 338.29   ??? Iron deficiency 280.9   ??? Fibromyalgia 729.1   ??? Insomnia 780.52   ??? Daytime somnolence 780.54   ??? Diastolic dysfunction 429.9   ??? Chronic constipation 564.00   ??? Dry eyes, bilateral 375.15   ??? Leg cramps 729.82   ??? Dysphagia, oropharyngeal phase 787.22       PSH:  Past Surgical History   Procedure Laterality Date   ??? Hx gyn     ??? Hx partial hysterectomy  2000     supracervical   ??? Hx orthopaedic       L4-L5 fusion   ??? Hx knee replacement       bilateral   ??? Hx rotator cuff repair       x 3 on right   ??? Hx gastric bypass  2004   ??? Hx appendectomy     ??? Hx tonsil and adenoidectomy       childhood   ??? Hx cesarean section  1978, 1982         SH:  History   Substance Use Topics   ??? Smoking status: Former Smoker -- 10 years     Types: Cigarettes     Quit date: 12/16/2010   ??? Smokeless tobacco: Never Used   ??? Alcohol Use: Yes      Comment: seldom       FH:  Family History   Problem Relation Age of Onset   ??? Alcohol abuse Mother    ??? Cancer Mother      breast   ??? Cancer Father      lung   ??? Diabetes Father    ??? Heart Disease Father    ??? Lung Disease Father    ??? Hypertension Father    ??? Cancer Brother      lung       Medications/Allergies:  Current Outpatient Prescriptions on File Prior to Visit   Medication Sig Dispense Refill   ??? pregabalin (LYRICA) 100 mg capsule Take 1 capsule every morning and 2 capsules every evening  90 capsule  1   ??? NEXIUM 40 mg capsule TAKE 1 CAPSULE BY MOUTH DAILY  30 capsule  0   ??? venlafaxine (EFFEXOR) 37.5 mg tablet TAKE 1 TABLET BY MOUTH TWICE DAILY  60 tablet  0   ??? furosemide (LASIX) 20 mg tablet TAKE 1 TABLET BY MOUTH TWICE DAILY  60 tablet  0   ??? FIBER 2 gram chew Take  by mouth.       ??? potassium chloride SR (KLOR-CON 8) 8 mEq tablet Take 1 tablet by mouth two (2) times a day.  60 tablet  12   ??? oxyCODONE IR (ROXICODONE) 15 mg immediate release tablet Take 15 mg by mouth every four (4) hours as needed for Pain.       ??? celecoxib (CELEBREX) 200 mg capsule Take 1 Cap by mouth two (2) times a day.  60 Cap  2   ??? fluticasone (FLONASE) 50 mcg/actuation nasal spray 2 Sprays by Both Nostrils route daily.  1 Bottle  12   ??? montelukast (SINGULAIR) 10 mg tablet Take 10 mg by mouth daily.       ??? multivitamin (ONE A DAY) tablet Take 1 Tab by mouth daily.       ??? ferrous sulfate 325 mg (65 mg iron) tablet Take  by mouth Daily (before breakfast).       ??? calcium citrate-vitamin D3 (CITRACAL WITH VITAMIN D MAXIMUM) tablet Take 2 Tabs by mouth three (3) times daily.       ??? cyanocobalamin 1,000 mcg Subl by SubLINGual route.       ??? magnesium 500 mg Tab Take  by mouth.         No current facility-administered medications on file prior  to visit.      No Known Allergies    Objective:  BP 132/82   Pulse 73   Temp(Src) 98 ??F (36.7 ??C) (Oral)   Resp 20   Ht 5\' 4"  (1.626 m)   Wt 202 lb (91.627 kg)   BMI 34.66 kg/m2   SpO2 98% Body mass index is 34.66 kg/(m^2).  Gen: Well developed, nourished, no distress, alert, obese habitus   HENT: Exterior ears and tympanic membranes normal bilaterally. Supple neck. No thyromegaly or lymphadenopathy. Oropharynx clear and moist mucous membranes.  +sinus inflammation   Eyes: Conjunctiva normal. PERRL.    CV: S1, S2.  RRR.  No murmurs/rubs.  No thrills palpated.  No carotid bruits.  Intact distal pulses.  tr edema.   Pulm: No abnormalities on inspection.  Clear to auscultation bilaterally. No wheezing/rhonchi.  Normal effort.     GI: Soft, nontender, nondistended.  Normal active bowel sounds. No  masses on palpation.  No hepatosplenomegaly.   Neuro: A/O x 3. .  No focal motor or sensory deficits. Speech normal.   Psych: Appropriate affect, judgement and insight.  Short-term memory intact.       EKG:NSR, no st changes    Assessment/Plan:    The patient is low risk for planned procedure and may proceed to OR without further testing.    The following recommendations were made for medication regimen prior to surgery:  Hold NSAIDs 7 days prior to surgery.    I will continue to follow along with the patient's treatment and care.  For any questions please do not hesitate to call the office at [phone].      Daine Gravel, MD

## 2013-05-20 NOTE — Progress Notes (Signed)
Pt in today for pre op exam. Reports fall today and pain to back and left elbow as a result. 1. Have you been to the ER, urgent care clinic since your last visit?  Hospitalized since your last visit?No    2. Have you seen or consulted any other health care providers outside of the [facility] since your last visit?  Include any pap smears or colon screening. Dr Marcy Salvoaymond GI 05-17-13.

## 2013-06-01 NOTE — Telephone Encounter (Signed)
Waiting on patient to blood work. Kaitlyn aware.

## 2013-06-01 NOTE — Telephone Encounter (Signed)
[place] requesting pre opt paperwork EKG and Labs Please fax to Dr. Doran ClayMankee office FAX# [phone]

## 2013-06-27 MED ORDER — FUROSEMIDE 20 MG TAB
20 mg | ORAL_TABLET | ORAL | Status: DC
Start: 2013-06-27 — End: 2014-02-01

## 2013-07-12 MED ORDER — TRAZODONE 100 MG TAB
100 mg | ORAL_TABLET | ORAL | Status: DC
Start: 2013-07-12 — End: 2013-12-06

## 2013-10-20 MED ORDER — VENLAFAXINE 75 MG TAB
75 mg | ORAL_TABLET | ORAL | Status: DC
Start: 2013-10-20 — End: 2013-12-14

## 2013-10-20 MED ORDER — TERBINAFINE 250 MG TAB
250 mg | ORAL_TABLET | Freq: Every day | ORAL | Status: DC
Start: 2013-10-20 — End: 2014-01-17

## 2013-10-20 NOTE — Telephone Encounter (Signed)
Please call dr. Henry Russelmanke's office.  Pt needs to reschedule her shoulder surgery that was originally scheduled for 05/2013.  She has not been able to get a return call.

## 2013-10-20 NOTE — Progress Notes (Signed)
Bridget Morton is a 58 y.o. female and presents with Complete Physical       Subjective:    Pt here for physical.  Has her baseline complaints of chronic pain.  Pt never got surgery on shoulder that she was supposed to get done.  Has had trouble rescheduling with Dr. Henry Morton's office.  States she's using a walker.  Is interested in PT.    Notes a lack of energy.  States she will stay in bed all day.  States she's lost her "oomph" to do anything.  Admits to depression.  States she's frustrated by her pain b/c it prevents her from doing anything.  The pt is on effexor.  Notes hair loss and dry skin.    ROS:  Constitutional: + recent weight change. No weakness/+fatigue.  No f/c.   Skin: No rashes, +change in nails/hair, no itching   HENT: No HA, dizziness. No hearing loss/tinnitus.  No nasal congestion/discharge.   Eyes: No change in vision, double/blurred vision or eye pain/redness.    Cardiovascular: No CP/palpitations.  No DOE/orthopnea/PND.   Respiratory: No cough/sputum, dyspnea, wheezing.   Gastointestinal: No dysphagia, reflux.  No n/v.  No constipation/diarrhea.  No melena/rectal bleeding.   Genitourinary: No dysuria, urinary hesitancy, nocturia, hematuria.  No incontinence.   Musculoskeletal: + joint pain/stiffness.  + muscle pain/tenderness.   Endo: No heat/cold intolerance, no polyuria/polydypsia.   Heme: + h/o anemia.  No easy bleeding/bruising.   Allergy/Immunology: No seasonal rhinitis. Denies frequent colds, sinus/ear infections.   Neurological: No seizures/numbness/weakness.  No paresthesias.   Psychiatric:  + depression, no anxiety.     The problem list was updated as a part of today's visit.  Patient Active Problem List   Diagnosis Code   ??? Internal hemorrhoid 455.0   ??? OA (osteoarthritis) 715.90   ??? Edema 782.3   ??? Adiposis dolorosa 272.8   ??? Chronic pain 338.29   ??? Iron deficiency 280.9   ??? Fibromyalgia 729.1   ??? Insomnia 780.52   ??? Daytime somnolence 780.54   ??? Diastolic dysfunction 429.9    ??? Chronic constipation 564.00   ??? Dry eyes, bilateral 375.15   ??? Leg cramps 729.82   ??? Dysphagia, oropharyngeal phase 787.22       The PSH, FH were reviewed.      SH:  History   Substance Use Topics   ??? Smoking status: Former Smoker -- 10 years     Types: Cigarettes     Quit date: 12/16/2010   ??? Smokeless tobacco: Never Used   ??? Alcohol Use: Yes      Comment: seldom         Medications/Allergies:  Current Outpatient Prescriptions on File Prior to Visit   Medication Sig Dispense Refill   ??? traZODone (DESYREL) 100 mg tablet TAKE 1 TABLET BY MOUTH EVERY NIGHT AT BEDTIME 90 Tab 1   ??? furosemide (LASIX) 20 mg tablet TAKE 1 TABLET BY MOUTH TWICE DAILY 60 Tab 6   ??? NEXIUM 40 mg capsule TAKE 1 CAPSULE BY MOUTH DAILY 30 capsule 6   ??? venlafaxine (EFFEXOR) 37.5 mg tablet TAKE 1 TABLET BY MOUTH TWICE DAILY 60 tablet 6   ??? linaclotide (LINZESS) 145 mcg cap capsule Take  by mouth Daily (before breakfast).     ??? pregabalin (LYRICA) 100 mg capsule Take 1 capsule every morning and 2 capsules every evening 90 capsule 1   ??? FIBER 2 gram chew Take  by mouth.     ???  potassium chloride SR (KLOR-CON 8) 8 mEq tablet Take 1 tablet by mouth two (2) times a day. 60 tablet 12   ??? oxyCODONE IR (ROXICODONE) 15 mg immediate release tablet Take 15 mg by mouth every four (4) hours as needed for Pain.     ??? celecoxib (CELEBREX) 200 mg capsule Take 1 Cap by mouth two (2) times a day. 60 Cap 2   ??? fluticasone (FLONASE) 50 mcg/actuation nasal spray 2 Sprays by Both Nostrils route daily. 1 Bottle 12   ??? montelukast (SINGULAIR) 10 mg tablet Take 10 mg by mouth daily.     ??? multivitamin (ONE A DAY) tablet Take 1 Tab by mouth daily.     ??? ferrous sulfate 325 mg (65 mg iron) tablet Take  by mouth Daily (before breakfast).     ??? calcium citrate-vitamin D3 (CITRACAL WITH VITAMIN D MAXIMUM) tablet Take 2 Tabs by mouth three (3) times daily.     ??? cyanocobalamin 1,000 mcg Subl by SubLINGual route.     ??? magnesium 500 mg Tab Take  by mouth.        No current facility-administered medications on file prior to visit.          No Known Allergies    Objective:  BP 120/74 mmHg   Pulse 75   Temp(Src) 98.1 ??F (36.7 ??C) (Oral)   Resp 18   Ht 5\' 3"  (1.6 m)   Wt 215 lb (97.523 kg)   BMI 38.09 kg/m2   SpO2 98%   Constitutional: Well developed, nourished, no distress, alert, obese habitus   HENT: Exterior ears and tympanic membranes normal bilaterally. Supple neck. No thyromegaly or lymphadenopathy. Oropharynx clear and moist mucous membranes.     Eyes: Conjunctiva normal. PERRL.    CV: S1, S2.  RRR.  No murmurs/rubs.  No thrills palpated.  No carotid bruits.  1+ pedal pulses.  +tr nonpitting BLE edema.     Pulm: No abnormalities on inspection.  Clear to auscultation bilaterally. No wheezing/rhonchi.  Normal effort.     GI: Soft, nontender, nondistended.  Normal active bowel sounds.    Neuro: A/O x 3. No focal motor or sensory deficits. Speech normal.   Skin: +onychomycosis of L big toe.  Toes are cool to touch and bluish in color.   Psych: depressed affect, appropriate judgement and insight.  Short-term memory intact.       Labwork and Ancillary Studies:    CBC w/Diff  Lab Results   Component Value Date/Time    WBC 4.3 03/10/2013 10:15 AM    HGB 12.8 03/10/2013 10:15 AM    PLATELET 161 03/10/2013 10:15 AM         Basic Metabolic Profile  Lab Results   Component Value Date/Time    SODIUM 144 07/26/2012 12:00 AM    POTASSIUM 4.3 07/26/2012 12:00 AM    CHLORIDE 100 07/26/2012 12:00 AM    CO2 31 07/26/2012 12:00 AM    GLUCOSE 77 07/26/2012 12:00 AM    BUN 21 07/26/2012 12:00 AM    CREATININE 0.95 07/26/2012 12:00 AM    BUN/CREATININE RATIO 22 07/26/2012 12:00 AM    GFR EST AA 78 07/26/2012 12:00 AM    GFR EST NON-AA 68 07/26/2012 12:00 AM    CALCIUM 8.9 07/26/2012 12:00 AM     Assessment/Plan:      Bridget Morton was seen today for complete physical.    Diagnoses and associated orders for this visit:    Routine general medical examination  at a health care facility  - LIPID PANEL     Osteoarthritis, unspecified osteoarthritis type, unspecified site  - VITAMIN D, 25 HYDROXY    Iron deficiency  - IRON PROFILE  - FERRITIN    Screening for breast cancer    Fatigue  - TSH RFX ON ABNORMAL TO FREE T4    Depression- uncontrolled  - venlafaxine (EFFEXOR) 75 mg tablet; TAKE 1 TABLET BY MOUTH TWICE DAILY (incr from 37.5mg  bid).  Fu in 1 mo    Decreased circulation- ?vasospasm (bluish color, cool to touch)  - REFERRAL TO VASCULAR SURGERY    Onychomycosis- discussed need to reck liver function in 2 mos  - terbinafine HCl (LAMISIL) 250 mg tablet; Take 1 Tab by mouth daily.  - METABOLIC PANEL, COMPREHENSIVE; Future    Chronic pain- recommend water therapy with advancement to land therapy as tolerated  - REFERRAL TO PHYSICAL THERAPY          The plan was discussed with the patient.  The patient verbalized understanding and is in agreement with the plan.  All medication potential side effects were discussed with the patient.    Health Maintenance:   Health Maintenance   Topic Date Due   ??? Hepatitis C Screening  04-May-1955   ??? BREAST CANCER SCRN MAMMOGRAM  11/12/2013   ??? INFLUENZA AGE 50 TO ADULT  11/26/2013   ??? COLONOSCOPY  07/14/2018   ??? Td Q 10 Yrs Age > 18  05/21/2023   ??? Tdap Age > 18  Addressed

## 2013-10-20 NOTE — Patient Instructions (Signed)
Terbinafine (By mouth)   Terbinafine (TER-bin-a-feen)  Treats fungal infections.   Brand Name(s):LamISIL, Terbinex   There may be other brand names for this medicine.  When This Medicine Should Not Be Used:   Do not use this medicine if you have had an allergic reaction to terbinafine.  How to Use This Medicine:   Packet, Tablet  ?? Take your medicine as directed.  ?? Terbinafine tablets may be taken with or without food. However, it is best to take terbinafine oral granules with food.  ?? Sprinkle the oral granules on a spoonful of pudding or other soft, non-acidic food such as mashed potatoes. This mixture must be swallowed immediately without chewing. Do not use applesauce or fruit-based foods. If you need 2 packets of oral granules with each dose, you may sprinkle the content of both packets on 1 spoonful or 2 spoonfuls of non-acidic food.  ?? Take all of the medicine in your prescription to clear up your infection, even if you feel better after the first few doses.  ?? This medicine works best when you take it at the same time every day and do not miss any doses.  ?? It may take several months for your infection to clear up completely and for healthy nails to grow out and replace the infected nails.  ?? Read and follow the patient instructions that come with this medicine. Talk to your doctor or pharmacist if you have any questions.  If a dose is missed:   ?? Take a dose as soon as you remember. If it is almost time for your next dose, wait until then and take a regular dose. Do not take extra medicine to make up for a missed dose.  ?? If you miss a dose of terbinafine tablets, take it as soon as you can. If your next regular dose is less than 4 hours away, wait until then to use the medicine and skip the missed dose.  How to Store and Dispose of This Medicine:   ?? Store the medicine in a closed container at room temperature, away from heat, moisture, and direct light.   ?? Ask your pharmacist, doctor, or health caregiver about the best way to dispose of any outdated medicine or medicine no longer needed.  ?? Keep all medicine out of the reach of children. Never share your medicine with anyone.  Drugs and Foods to Avoid:   Ask your doctor or pharmacist before using any other medicine, including over-the-counter medicines, vitamins, and herbal products.  ?? Make sure your doctor knows if you also use cimetidine (Tagamet??), cyclosporine (such as Gengraf??, Neoral??, Sandimmune??), dextromethorphan (Benylin??), fluconazole (Diflucan??), ketoconazole (Nizoral??), rifampin (such as Rifadin??, Rifamate??, Rifater??, or Rimactane??), blood pressure medicine (such as atenolol, carvedilol, metoprolol, propranolol, timolol, Blocadren??, Coreg??, or Toprol??), medicine for heart rhythm problems (such as amiodarone, flecainide, propafenone, Cordarone??, Tambocor??, or Rythmol??), medicine for depression (such as amitriptyline, clomipramine, desipramine, fluoxetine, fluvoxamine, imipramine, nortriptyline, paroxetine, venlafaxine, Anafranil??, Aventyl??, Effexor??, Luvox??, Norpramin??, Pamelor??, Paxil??, Prozac??, or Tofranil??), or an MAO inhibitor (such as selegiline or Eldepryl??).  ?? Also tell your doctor if you use caffeine (coffee, soda, chocolate).  Warnings While Using This Medicine:   ?? Make sure your doctor knows if you are pregnant or breastfeeding or if you have kidney disease, liver disease, blood problems, lupus, or a weak immune system (such as HIV infection or AIDS).  ?? Stop taking this medicine and check with your doctor right away if you have nausea or vomiting,   dark-colored urine, pale stools, upper stomach pain, or yellow eyes or skin. These may be symptoms of a serious liver problem.  ?? This medicine may affect your sense of taste or smell. Tell your doctor if you have changes to your sense of taste or smell, poor appetite, or weight loss.   ?? Tell your doctor right away if you think this medicine is causing depression or other changes in your mood or behavior.  ?? Rarely, this medicine can cause serious skin reactions. Call your doctor right away if you have blistering, peeling, or loose skin, red skin lesions, severe skin rash, sores or ulcers on the skin, or fever or chills while you are using this medicine.  ?? This medicine may make your skin more sensitive to sunlight. Wear sunscreen. Do not use sunlamps or tanning beds. Tell your doctor right away if you have a red, scaly skin rash.  ?? This medicine may make you bleed, bruise, or get infections more easily. Take precautions to prevent illness and injury. Wash your hands often.  ?? Call your doctor if your symptoms do not improve or if they get worse. You may need to take this medicine for several weeks or months before your infection gets better.  ?? Your doctor will do lab tests at regular visits to check on the effects of this medicine. Keep all appointments.  Possible Side Effects While Using This Medicine:   Call your doctor right away if you notice any of these side effects:  ?? Allergic reaction: Itching or hives, swelling in your face or hands, swelling or tingling in your mouth or throat, chest tightness, trouble breathing  ?? Blistering, peeling, scaly, or red skin rash  ?? Changes in behavior or mood, depression  ?? Dark-colored urine or pale stools  ?? Fever, chills, cough, sore throat, and body aches  ?? Nausea, vomiting, loss of appetite, or pain in the upper stomach  ?? Unusual bleeding, bruising, or weakness  ?? Yellow skin or eyes  If you notice these less serious side effects, talk with your doctor:   ?? Change in or loss of taste or smell  ?? Headache  ?? Mild nausea or vomiting  ?? Mild skin rash or itching  ?? Stuffy or runny nose  If you notice other side effects that you think are caused by this medicine, tell your doctor.    Call your doctor for medical advice about side effects. You may report side effects to FDA at 1-800-FDA-1088  ?? 2014 [employer] Information is for End User's use only and may not be sold, redistributed or otherwise used for commercial purposes.  The above information is an educational aid only. It is not intended as medical advice for individual conditions or treatments. Talk to your doctor, nurse or pharmacist before following any medical regimen to see if it is safe and effective for you.

## 2013-10-20 NOTE — Progress Notes (Signed)
Pt in today for CPE. 1. Have you been to the ER, urgent care clinic since your last visit?  Hospitalized since your last visit?No    2. Have you seen or consulted any other health care providers outside of the [facility] since your last visit?  Include any pap smears or colon screening. Dr. Hyacinth MeekerMiller rheumatology, Dr. Sonny MastersManke orthopaedic.

## 2013-10-21 LAB — IRON PROFILE
Iron % saturation: 17 % (ref 15–55)
Iron: 49 ug/dL (ref 35–155)
TIBC: 291 ug/dL (ref 250–450)
UIBC: 242 ug/dL (ref 150–375)

## 2013-10-21 LAB — CBC WITH AUTOMATED DIFF
ABS. BASOPHILS: 0 10*3/uL (ref 0.0–0.2)
ABS. EOSINOPHILS: 0.1 10*3/uL (ref 0.0–0.4)
ABS. IMM. GRANS.: 0 10*3/uL (ref 0.0–0.1)
ABS. MONOCYTES: 0.3 10*3/uL (ref 0.1–0.9)
ABS. NEUTROPHILS: 2.5 10*3/uL (ref 1.4–7.0)
Abs Lymphocytes: 1.2 10*3/uL (ref 0.7–3.1)
BASOPHILS: 1 %
EOSINOPHILS: 2 %
HCT: 38.3 % (ref 34.0–46.6)
HGB: 12.7 g/dL (ref 11.1–15.9)
IMMATURE GRANULOCYTES: 0 %
Lymphocytes: 30 %
MCH: 26.9 pg (ref 26.6–33.0)
MCHC: 33.2 g/dL (ref 31.5–35.7)
MCV: 81 fL (ref 79–97)
MONOCYTES: 7 %
NEUTROPHILS: 60 %
PLATELET: 162 10*3/uL (ref 150–379)
RBC: 4.72 x10E6/uL (ref 3.77–5.28)
RDW: 14.6 % (ref 12.3–15.4)
WBC: 4.1 10*3/uL (ref 3.4–10.8)

## 2013-10-21 LAB — METABOLIC PANEL, BASIC
BUN/Creatinine ratio: 24 — ABNORMAL HIGH (ref 9–23)
BUN: 14 mg/dL (ref 6–24)
CO2: 29 mmol/L (ref 18–29)
Calcium: 9 mg/dL (ref 8.7–10.2)
Chloride: 98 mmol/L (ref 97–108)
Creatinine: 0.59 mg/dL (ref 0.57–1.00)
GFR est AA: 118 mL/min/{1.73_m2} (ref >59)
GFR est non-AA: 102 mL/min/{1.73_m2} (ref >59)
Glucose: 87 mg/dL (ref 65–99)
Potassium: 4.2 mmol/L (ref 3.5–5.2)
Sodium: 141 mmol/L (ref 134–144)

## 2013-10-21 LAB — LIPID PANEL
Cholesterol, total: 194 mg/dL (ref 100–199)
HDL Cholesterol: 83 mg/dL (ref >39)
LDL, calculated: 95 mg/dL (ref 0–99)
Triglyceride: 79 mg/dL (ref 0–149)
VLDL, calculated: 16 mg/dL (ref 5–40)

## 2013-10-21 LAB — FERRITIN: Ferritin: 227 ng/mL — ABNORMAL HIGH (ref 15–150)

## 2013-10-21 LAB — TSH RFX ON ABNORMAL TO FREE T4: TSH: 3.29 u[IU]/mL (ref 0.450–4.500)

## 2013-10-21 LAB — CVD REPORT

## 2013-10-21 LAB — VITAMIN D, 25 HYDROXY: VITAMIN D, 25-HYDROXY: 33.9 ng/mL (ref 30.0–100.0)

## 2013-10-24 MED ORDER — FLUTICASONE 50 MCG/ACTUATION NASAL SPRAY, SUSP
50 mcg/actuation | NASAL | Status: DC
Start: 2013-10-24 — End: 2016-04-09

## 2013-10-25 NOTE — Telephone Encounter (Signed)
Waiting on return call from [facility] office. We have been playing phone tag.

## 2013-12-06 MED ORDER — TRAZODONE 100 MG TAB
100 mg | ORAL_TABLET | ORAL | Status: DC
Start: 2013-12-06 — End: 2014-05-12

## 2013-12-14 MED ORDER — VENLAFAXINE 75 MG TAB
75 mg | ORAL_TABLET | ORAL | Status: DC
Start: 2013-12-14 — End: 2016-04-09

## 2013-12-19 MED ORDER — ESOMEPRAZOLE MAGNESIUM 40 MG CAP, DELAYED RELEASE
40 mg | ORAL_CAPSULE | ORAL | Status: DC
Start: 2013-12-19 — End: 2014-07-19

## 2013-12-20 ENCOUNTER — Encounter

## 2014-01-17 MED ORDER — TRIAMCINOLONE ACETONIDE 0.1 % OINTMENT
0.1 % | Freq: Two times a day (BID) | CUTANEOUS | Status: DC
Start: 2014-01-17 — End: 2014-07-26

## 2014-01-17 NOTE — Progress Notes (Signed)
Assessment/Plan:    Chihuahua was seen today for rash.    Diagnoses and associated orders for this visit:    Dermatitis  - triamcinolone acetonide (KENALOG) 0.1 % ointment; Apply  to affected area two (2) times a day. use thin layer    Memory impairment- unclear if it is concentration issues vs depression vs absence seizures vs true cognitive impairment.  Referral neuro for neuropsych testing.  - REFERRAL TO NEUROLOGY    Episode of jerking_?tremor  - REFERRAL TO NEUROLOGY    Need for influenza vaccination  - Influenza vaccine, trivalent, 3/> yrs, IM (SEASONAL - ALL BRANDS)    Obesity- tsh was normal.  Doubt wt is from edema (no pitting edema on exam).          The plan was discussed with the patient.  The patient verbalized understanding and is in agreement with the plan.  All medication potential side effects were discussed with the patient.    Health Maintenance:   Health Maintenance   Topic Date Due   ??? Hepatitis C Screening  10-07-1955   ??? INFLUENZA AGE 74 TO ADULT  11/26/2013   ??? BREAST CANCER SCRN MAMMOGRAM  07/28/2015   ??? COLONOSCOPY  07/14/2018   ??? Td Q 10 Yrs Age > 18  05/21/2023   ??? Tdap Age > 18  Addressed     Bridget Morton is a 58 y.o. female and presents with Rash     Subjective:  Notes a rash on R forearm that started 2 weeks ago.  Has had this rash in past, but this time it was worse.  States she developed white bumps on the rash.  She squeezed one and "white water came out".  Rash was itchy.  She scratched and now has scabs.  Itching has improved.    States "something is not right in my head".  My "brain isn't functioning".  She states she will "lose part of her day". States it will taker her all morning to get ready b/c she is in pain and she has to "rest".  States "i'm losing it".  She has difficulty understanding things- states her short term memory is bad (forgets things she is supposed to do, can't remember if she took her meds).  Also notes jerking of her extremities and  head that is uncontrollable.  Having frequent HA.  States she feels like she is not here, like she is somewhere else.    Pt is frustrated by weight gain.  She states "I eat nothing,  It can't be what I'm eating."  TSH was normal 09/2013.  Has some mild BLE edema.  No orthopnea, dyspnea.    ROS:  Constitutional: + recent weight change. No weakness/+fatigue.  No f/c.   Skin: + rashes, no change in nails/hair, itching   HENT: + HA, no dizziness. No hearing loss/tinnitus.  No nasal congestion/discharge.   Eyes: No change in vision, +blurred vision no eye pain/redness.    Cardiovascular: No CP/palpitations.  No DOE/orthopnea/PND.   Respiratory: No cough/sputum, dyspnea, wheezing.   Gastointestinal: No dysphagia, reflux.  No n/v.  No constipation/diarrhea.  No melena/rectal bleeding.     The problem list was updated as a part of today's visit.  Patient Active Problem List   Diagnosis Code   ??? Internal hemorrhoid 455.0   ??? OA (osteoarthritis) 715.90   ??? Edema 782.3   ??? Adiposis dolorosa 272.8   ??? Chronic pain 338.29   ??? Iron deficiency 280.9   ???  Fibromyalgia 729.1   ??? Insomnia 780.52   ??? Daytime somnolence 413.24   ??? Diastolic dysfunction 401.0   ??? Chronic constipation 564.00   ??? Dry eyes, bilateral 375.15   ??? Leg cramps 729.82   ??? Dysphagia, oropharyngeal phase 787.22   ??? Depression 311       The PSH, FH were reviewed.    SH:  History   Substance Use Topics   ??? Smoking status: Former Smoker -- 10 years     Types: Cigarettes     Quit date: 12/16/2010   ??? Smokeless tobacco: Never Used   ??? Alcohol Use: Yes      Comment: seldom       Medications/Allergies:  Current Outpatient Prescriptions on File Prior to Visit   Medication Sig Dispense Refill   ??? esomeprazole (NEXIUM) 40 mg capsule TAKE 1 CAPSULE BY MOUTH DAILY 30 Cap 6   ??? venlafaxine (EFFEXOR) 75 mg tablet TAKE 1 TABLET BY MOUTH TWICE DAILY 60 Tab 6   ??? traZODone (DESYREL) 100 mg tablet TAKE ONE TABLET BY MOUTH EVERY NIGHT AT BEDTIME 90 Tab 0    ??? fluticasone (FLONASE) 50 mcg/actuation nasal spray SPRAY 2 SPRAYS IN BOTH NOSTRILS DAILY 3 Bottle 3   ??? furosemide (LASIX) 20 mg tablet TAKE 1 TABLET BY MOUTH TWICE DAILY 60 Tab 6   ??? linaclotide (LINZESS) 145 mcg cap capsule Take  by mouth daily as needed.     ??? pregabalin (LYRICA) 100 mg capsule Take 1 capsule every morning and 2 capsules every evening 90 capsule 1   ??? FIBER 2 gram chew Take  by mouth daily.     ??? potassium chloride SR (KLOR-CON 8) 8 mEq tablet Take 1 tablet by mouth two (2) times a day. 60 tablet 12   ??? oxyCODONE IR (ROXICODONE) 15 mg immediate release tablet Take 15 mg by mouth every four (4) hours as needed for Pain.     ??? celecoxib (CELEBREX) 200 mg capsule Take 1 Cap by mouth two (2) times a day. 60 Cap 2   ??? montelukast (SINGULAIR) 10 mg tablet Take 10 mg by mouth daily.     ??? multivitamin (ONE A DAY) tablet Take 1 Tab by mouth daily.     ??? calcium citrate-vitamin D3 (CITRACAL WITH VITAMIN D MAXIMUM) tablet Take 2 Tabs by mouth two (2) times a day.     ??? cyanocobalamin 1,000 mcg Subl by SubLINGual route.     ??? magnesium 500 mg Tab Take  by mouth daily.       No current facility-administered medications on file prior to visit.        No Known Allergies    Objective:  BP 124/78 mmHg   Pulse 88   Temp(Src) 98.5 ??F (36.9 ??C) (Oral)   Resp 22   Ht 5' 3"  (1.6 m)   Wt 227 lb (102.967 kg)   BMI 40.22 kg/m2   SpO2 97%   Constitutional: Well developed, nourished, no distress, alert, obese habitus   CV: S1, S2.  RRR.  No murmurs/rubs.  No thrills palpated.  No carotid bruits.  Intact distal pulses.  No edema.   Pulm: No abnormalities on inspection.  Clear to auscultation bilaterally. No wheezing/rhonchi.  Normal effort.     Neuro: A/O x 3. No focal motor or sensory deficits. Speech normal.   Skin: Scabs and excoriations on R forearm.  No celluitis.     Psych: Appropriate affect, judgement and insight.  Short-term memory  intact.       Labwork and Ancillary Studies:    CBC w/Diff  Lab Results    Component Value Date/Time    WBC 4.1 10/20/2013 02:17 PM    HGB 12.7 10/20/2013 02:17 PM    PLATELET 162 10/20/2013 02:17 PM         Basic Metabolic Profile/LFTs  Lab Results   Component Value Date/Time    SODIUM 141 10/20/2013 02:17 PM    POTASSIUM 4.2 10/20/2013 02:17 PM    CHLORIDE 98 10/20/2013 02:17 PM    CO2 29 10/20/2013 02:17 PM    GLUCOSE 87 10/20/2013 02:17 PM    BUN 14 10/20/2013 02:17 PM    CREATININE 0.59 10/20/2013 02:17 PM    BUN/CREATININE RATIO 24 10/20/2013 02:17 PM    GFR EST AA 118 10/20/2013 02:17 PM    GFR EST NON-AA 102 10/20/2013 02:17 PM    CALCIUM 9.0 10/20/2013 02:17 PM      Lab Results   Component Value Date/Time    ALT 28 07/26/2012 12:00 AM    AST 29 07/26/2012 12:00 AM    ALK. PHOSPHATASE 66 07/26/2012 12:00 AM    BILIRUBIN, TOTAL 0.2 07/26/2012 12:00 AM       Cholesterol  Lab Results   Component Value Date/Time    CHOLESTEROL, TOTAL 194 10/20/2013 02:17 PM    HDL CHOLESTEROL 83 10/20/2013 02:17 PM    LDL, CALCULATED 95 10/20/2013 02:17 PM    TRIGLYCERIDE 79 10/20/2013 02:17 PM

## 2014-01-17 NOTE — Progress Notes (Signed)
Pt in today skin issue. 1. Have you been to the ER, urgent care clinic since your last visit?  Hospitalized since your last visit?No    2. Have you seen or consulted any other health care providers outside of the [facility] since your last visit?  Include any pap smears or colon screening. Dr Buford Dresser pain management

## 2014-01-31 NOTE — Telephone Encounter (Signed)
Please check status of neurology referral.

## 2014-01-31 NOTE — Addendum Note (Signed)
Addended byDaine Gravel: Tamica Covell on: 01/31/2014 02:22 PM      Modules accepted: Orders

## 2014-02-01 MED ORDER — FUROSEMIDE 20 MG TAB
20 mg | ORAL_TABLET | ORAL | Status: DC
Start: 2014-02-01 — End: 2014-03-15

## 2014-02-01 MED ORDER — POTASSIUM CHLORIDE SR 8 MEQ TAB
8 mEq | ORAL_TABLET | ORAL | Status: DC
Start: 2014-02-01 — End: 2014-03-15

## 2014-02-01 NOTE — Telephone Encounter (Signed)
Sp[facility] with [facility] and they are calling pt this morning to get her apt set up.

## 2014-03-02 ENCOUNTER — Encounter: Attending: Pain Medicine | Primary: Internal Medicine

## 2014-03-03 DIAGNOSIS — Z9884 Bariatric surgery status: Secondary | ICD-10-CM | POA: Insufficient documentation

## 2014-03-16 ENCOUNTER — Encounter: Attending: Pain Medicine | Primary: Internal Medicine

## 2014-03-16 MED ORDER — POTASSIUM CHLORIDE SR 8 MEQ TAB
8 mEq | ORAL_TABLET | ORAL | Status: DC
Start: 2014-03-16 — End: 2014-08-16

## 2014-03-16 MED ORDER — FUROSEMIDE 20 MG TAB
20 mg | ORAL_TABLET | ORAL | Status: DC
Start: 2014-03-16 — End: 2014-10-20

## 2014-03-28 ENCOUNTER — Encounter: Attending: Pain Medicine | Primary: Internal Medicine

## 2014-04-12 ENCOUNTER — Encounter: Attending: Pain Medicine | Primary: Internal Medicine

## 2014-04-28 HISTORY — PX: LUMBAR FUSION: SHX111

## 2014-05-05 ENCOUNTER — Encounter: Attending: Pain Medicine | Primary: Internal Medicine

## 2014-05-08 ENCOUNTER — Ambulatory Visit
Admit: 2014-05-08 | Discharge: 2014-05-08 | Payer: PRIVATE HEALTH INSURANCE | Attending: Pain Medicine | Primary: Internal Medicine

## 2014-05-08 ENCOUNTER — Encounter

## 2014-05-08 ENCOUNTER — Ambulatory Visit: Attending: Pain Medicine | Primary: Internal Medicine

## 2014-05-08 DIAGNOSIS — M961 Postlaminectomy syndrome, not elsewhere classified: Secondary | ICD-10-CM

## 2014-05-08 DIAGNOSIS — M5416 Radiculopathy, lumbar region: Secondary | ICD-10-CM | POA: Insufficient documentation

## 2014-05-08 DIAGNOSIS — R5383 Other fatigue: Secondary | ICD-10-CM | POA: Insufficient documentation

## 2014-05-08 DIAGNOSIS — M5412 Radiculopathy, cervical region: Secondary | ICD-10-CM | POA: Insufficient documentation

## 2014-05-08 LAB — AMB POC DRUG SCREEN (G0434): OXYCODONE UR POC: POSITIVE

## 2014-05-08 MED ORDER — MORPHINE ER 15 MG TAB
15 mg | ORAL_TABLET | Freq: Three times a day (TID) | ORAL | Status: AC
Start: 2014-05-08 — End: 2014-06-07

## 2014-05-08 MED ORDER — MORPHINE ER 15 MG TAB
15 mg | ORAL_TABLET | Freq: Three times a day (TID) | ORAL | Status: AC
Start: 2014-05-08 — End: 2014-07-06

## 2014-05-08 MED ORDER — HYDROMORPHONE 2 MG TAB
2 mg | ORAL_TABLET | Freq: Four times a day (QID) | ORAL | Status: DC | PRN
Start: 2014-05-08 — End: 2014-07-17

## 2014-05-08 NOTE — Progress Notes (Signed)
HISTORY OF PRESENT ILLNESS  Bridget Morton is a 59 y.o. female. Right-handed  HPI She is seen in comprehensive consultation at the Center for pain management. She is referred for evaluation and treatment of chronic, severe pain involving the cervical spine with radiation to both shoulders, bilateral shoulder pain, low back pain and bilateral hip pain.  Extensive external medical records pertaining to her prior treatment were reviewed.    She has a long standing history of low back pain. She underwent a lumbar fusion on 01/08/10.  She underwent an L3-5 redo,decompression,and fusion L4-5 on 08/19/12 which produced increasing symptoms.   No recent imaging studies.  No recent interventions. No recent PT.    She has just started seeing a new psychiatrist today..    Currently, her pain is continuous and is characterized as throbbing, burning, aching, sharp, and dull.  There is associated numbness and tingling.  Pain is alleviated by lying down, as well as sitting and relaxation and is aggravated by standing walking and exercise.    Pain level today is 7/10, outcome 15/28,(The lower the upper number, the better the outcome)  Physical activity and mobility as well as sleep are poor, mood is fair.    Past history: Bilateral sacroiliitis, Dercum's Disease, trochanteric bursitis, tunnel syndrome, constipation, GERD, chronic insomnia with sleep attacks. She has never been evaluated for possible sleep apnea.  Past surgery: 4 surgeries on the right rotator cuff. She indicates that it has been recommended that she undergo bilateral shoulder replacement. Bilateral TKR, gastric bypass in 2004 at which time she weighed 307 pounds.  Family history: Diabetes, hypertension, coronary artery disease, myocardial infarction, stroke, headache, lung cancer, breast cancer.  Social history: She is married and is not employed outside the home being disabled. She is a former smoker and a rare drinker. She denies any  current or recent recreational substance usage.    Low risk is identified on the opioid risk screening tool      A score of 18 on the PHQ-9 is consistent with moderate - severe affective distress.    PCS Score: 17. No significant catastrophising. No elevations of the ruminations, magnification, or helplessness subscales.    A review of the [place] prescription monitoring program does not identify any inconsistency.  UDS obtained and reviewed; formal confirmation from laboratory is pending.    A salivary fluid specimen was obtained and forwarded to the laboratory for cytogenetic analysis.      Review of Systems   Constitutional: Positive for malaise/fatigue.   Gastrointestinal: Positive for heartburn, nausea and constipation.   Musculoskeletal: Positive for myalgias, back pain, joint pain and neck pain.   Neurological: Positive for weakness (generalized).   Psychiatric/Behavioral: The patient has insomnia.    All other systems reviewed and are negative.      Physical Exam   Constitutional: She is oriented to person, place, and time. She appears distressed.   HENT:   Head: Normocephalic and atraumatic.   Right Ear: External ear normal.   Left Ear: External ear normal.   Nose: Nose normal.   Mouth/Throat: Oropharynx is clear and moist. No oropharyngeal exudate.   Eyes: Conjunctivae and EOM are normal. Pupils are equal, round, and reactive to light. Right eye exhibits no discharge. Left eye exhibits no discharge. No scleral icterus.   Neck: Spinous process tenderness and muscular tenderness present. Decreased range of motion present. No thyromegaly present.   Cardiovascular: Normal rate, regular rhythm and normal heart sounds.    Pulmonary/Chest: Effort  normal and breath sounds normal. No respiratory distress. She has no wheezes. She has no rales.   Abdominal: Soft. She exhibits no distension. There is no tenderness. There is no rebound and no guarding.   Musculoskeletal: She exhibits tenderness.         Right shoulder: She exhibits decreased range of motion, tenderness and bony tenderness.        Left shoulder: She exhibits decreased range of motion, tenderness and bony tenderness.        Right elbow: She exhibits decreased range of motion. Tenderness found.        Left elbow: She exhibits decreased range of motion. Tenderness found.        Right wrist: She exhibits decreased range of motion, tenderness and bony tenderness.        Left wrist: She exhibits decreased range of motion, tenderness and bony tenderness.        Lumbar back: She exhibits decreased range of motion, tenderness, pain and spasm.   Neurological: She is alert and oriented to person, place, and time. She has normal reflexes. No cranial nerve deficit or sensory deficit. She exhibits normal muscle tone. Gait abnormal. Coordination normal.   Reflex Scores:       Tricep reflexes are 2+ on the right side and 2+ on the left side.       Bicep reflexes are 2+ on the right side and 2+ on the left side.       Brachioradialis reflexes are 2+ on the right side and 2+ on the left side.       Patellar reflexes are 2+ on the right side and 2+ on the left side.       Achilles reflexes are 2+ on the right side and 2+ on the left side.  Skin: Skin is warm. No rash noted.   Psychiatric: She has a normal mood and affect. Her behavior is normal. Judgment and thought content normal.   Nursing note and vitals reviewed.      ASSESSMENT and PLAN  Encounter Diagnoses   Name Primary?   ??? Lumbar post-laminectomy syndrome Yes   ??? Encounter for long-term (current) use of high-risk medication    ??? Depression    ??? Chronic constipation    ??? Dry eyes, bilateral    ??? Cramp of both lower extremities    ??? Fibromyalgia    ??? Adiposis dolorosa    ??? Primary osteoarthritis involving multiple joints    ??? Chronic pain    ??? Cervical radiculopathy    ??? Lumbar radiculopathy    ??? Non-restorative sleep    ??? Fatigue    ??? Chronic insomnia    ??? OSA (obstructive sleep apnea)       An MRI of the lumbar spine with and without contrast will be arranged to assess whether interventional techniques would be of benefit.  A sleep consultation will also be scheduled to help evaluate and assess her severe insomnia, sleep attacks, and possible sleep apnea.  Physical therapy will be initiated.  OxyContin and oxycodone will be discontinued as she has been on these medications historically for a considerable period of time and this finding increasing lack of response.  A trial of MS Contin, 15 mg, 3 times daily for long-acting pain control with hydromorphone, 2 mg, 4 times daily for breakthrough pain will be initiated  Further recommendations will be based upon her response to the above.    1. Pain medications are prescribed with the objective  of pain relief and improved physical and psychosocial function in this patient.  2. Counseled patient on proper use of prescribed medications and reviewed opioid contract.  3. Counseled patient about chronic medical conditions and their relationship to anxiety and depression and recommended mental health support as needed.  4. Reviewed with patient self-help tools, home exercise, and lifestyle changes to assist the patient in self-management of symptoms.  5. Advised patient to have a primary care provider to continue care for health maintenance and general medical conditions and support for referral to specialty care as needed.  6. Reviewed with patient the treatment plan, goals of treatment plan, and limitations of treatment plan, to include the potential for side effects from medications and procedures. If side effects occur, it is the responsibility of the patient to inform the clinic so that a change in the treatment plan can be made in a safe manner. The patient is advised that stopping prescribed medication may cause an increase in symptoms and possible medication withdrawal symptoms. The patient is informed an  emergency room evaluation may be necessary if this occurs.  DISPOSITION: The patient???s condition and plan were discussed at length and all questions were answered. The patient agrees with the plan.    Counseling occupied > 50% of visit:  Total time: 65 minutes

## 2014-05-08 NOTE — Progress Notes (Signed)
Nursing Notes    Patient presents to the office today for a new pt appt.   Patient rates her pain at 7/10 on the numerical pain scale.        POC UDS was performed in office today.    Any new labs or imaging since last appointment? NO    Have you been to an emergency room (ER) or urgent care clinic since your last visit?  NO            Have you been hospitalized since your last visit? NO     If yes, where, when, and reason for visit?     Have you seen or consulted any other health care providers outside of the [facility]  since your last visit?  NO     If yes, where, when, and reason for visit?     HM deferred to pcp.

## 2014-05-08 NOTE — Patient Instructions (Signed)
Current health maintenance issues were reviewed and the patient was advised to followup with his/her PCP for completion of these items.

## 2014-05-12 MED ORDER — TRAZODONE 100 MG TAB
100 mg | ORAL_TABLET | ORAL | Status: DC
Start: 2014-05-12 — End: 2014-11-07

## 2014-05-12 NOTE — Progress Notes (Signed)
Referrals for lumbar [facility]. Pulmonary referral was faxed to Dr. Jimmie MollyGreenberg for consult. PT referral was fax[facility] for scheduling [facility] [place]. They will schedule directly with pt.

## 2014-05-25 ENCOUNTER — Encounter: Primary: Internal Medicine

## 2014-06-23 ENCOUNTER — Encounter: Primary: Internal Medicine

## 2014-06-26 MED ORDER — PREGABALIN 100 MG CAP
100 mg | ORAL_CAPSULE | ORAL | Status: DC
Start: 2014-06-26 — End: 2014-08-24

## 2014-06-26 NOTE — Telephone Encounter (Signed)
Printed, please fax.

## 2014-06-26 NOTE — Telephone Encounter (Signed)
From: Bridget SparrowJanet Mize Linthicum  To: Bridget PasseySusan Kenry Daubert V, MD  Sent: 06/23/2014 3:57 PM EST  Subject:  Medication Renewal Request    Original  authorizing provider: Kerman PasseySUSAN Morton Alexes Lamarque, MD    Bridget SparrowJanet  Mize Morton would like a refill of the following medications:  pregabalin  (LYRICA) 100 mg capsule Bridget Passey[Yair Dusza Morton Shatarra Wehling, MD]    Preferred  pharmacy: [id number] - [place], [place] - [address] [facility] [address] [address]  [place]    Comment:

## 2014-06-26 NOTE — Telephone Encounter (Signed)
RX faxed to pharmacy.

## 2014-06-30 ENCOUNTER — Encounter: Primary: Internal Medicine

## 2014-07-04 ENCOUNTER — Encounter: Primary: Internal Medicine

## 2014-07-04 ENCOUNTER — Encounter: Attending: Medical | Primary: Internal Medicine

## 2014-07-04 NOTE — Telephone Encounter (Signed)
If she misses one more, she should be discharged

## 2014-07-04 NOTE — Telephone Encounter (Signed)
Pt is requesting MRI results. They are in Care Everywhere.

## 2014-07-04 NOTE — Telephone Encounter (Signed)
Pt called requesting meds to last until her next scheduled educ class. After consulting Inetta Fermoina and Karoline CaldwellAngie, pt has cancelled 4 classes and will need to make her current meds last until she attends her rescheduled class. She was advised of this and was advised as well that it was made abundantly clear that she would have to have an education class prior to her next f/u. She expressed understanding and was given the rescheduled details again.

## 2014-07-12 ENCOUNTER — Encounter

## 2014-07-14 ENCOUNTER — Ambulatory Visit: Admit: 2014-07-14 | Discharge: 2014-07-14 | Payer: PRIVATE HEALTH INSURANCE | Primary: Internal Medicine

## 2014-07-14 DIAGNOSIS — Z7189 Other specified counseling: Secondary | ICD-10-CM

## 2014-07-14 NOTE — Progress Notes (Signed)
Ms. Gavin PoundLowder attended the Education Class facilitated by Tod PersiaAngela Kirk Basquez, RN Objectives of this class are to review practice policies, protocols and the Controlled Substances Consent and Treatment Agreement, discuss "what if" scenarios, introduce staff, and provide an opportunity for questions and answers. Ultimately, goals for this class are for Ms. Sutphin to:    ?? Be educated - Learn as much as possible about her pain and related treatment, our policies and the Controlled Substances Agreement.  ?? Be responsible - Follow the provider???s advice regarding treatment recommendations, medications, and prescription information.  ?? Be confident - Better manage her pain and return to a more functional lifestyle.    At least 45 min was spent with the patient in face-to-face contact today.

## 2014-07-14 NOTE — Telephone Encounter (Signed)
Pt has had many cancelled appts. Pt education class was cancelled, which caused f/u appt to be cancelled. Pt currently out of meds and is in need of a withdrawal regimen.

## 2014-07-17 ENCOUNTER — Ambulatory Visit
Admit: 2014-07-17 | Discharge: 2014-07-17 | Payer: PRIVATE HEALTH INSURANCE | Attending: Pain Medicine | Primary: Internal Medicine

## 2014-07-17 ENCOUNTER — Ambulatory Visit: Attending: Pain Medicine | Primary: Internal Medicine

## 2014-07-17 DIAGNOSIS — M5416 Radiculopathy, lumbar region: Secondary | ICD-10-CM

## 2014-07-17 MED ORDER — HYDROMORPHONE 2 MG TAB
2 mg | ORAL_TABLET | Freq: Four times a day (QID) | ORAL | Status: DC | PRN
Start: 2014-07-17 — End: 2014-07-26

## 2014-07-17 MED ORDER — MORPHINE ER 30 MG TAB
30 mg | ORAL_TABLET | Freq: Two times a day (BID) | ORAL | Status: AC
Start: 2014-07-17 — End: 2014-08-16

## 2014-07-17 MED ORDER — OTHER
Status: DC
Start: 2014-07-17 — End: 2014-08-16

## 2014-07-17 MED ORDER — MORPHINE ER 30 MG TAB
30 mg | ORAL_TABLET | Freq: Two times a day (BID) | ORAL | Status: DC
Start: 2014-07-17 — End: 2014-07-26

## 2014-07-17 NOTE — Progress Notes (Signed)
HISTORY OF PRESENT ILLNESS  Bridget Morton is a 59 y.o. female.  HPI She returns for followup of chronic, severe pain involving the cervical region with radiation to both upper extremity as well as the lumbar spine with radiation to both lower extremities.    Since last seen, she underwent an MRI of the lumbar spine with and without contrast which was reviewed with results as follows:  FINDINGS:Postsurgical changes are again noted status post posterior rod and bilateral pedicle screw fixation from L3-L5 and consistent with interval revision in correlation with the prior CT myelogram. Metallic susceptibility artifact associated with the with the pubic hardware partially obscures visualization of the surrounding structures. Posterior decompression is noted at the L4-5 level. Multilevel degenerative disc disease is present with disc desiccation and disc space narrowing with marginal osteophytic spurring. No compression fractures are identified. There is grade 2 anterolisthesis of L4 on L5 which does not appear to have significantly changed. No compression fractures are identified. There is minimal retrolisthesis of L1 on L2 and L2 on L3.    The spinal canal and foramina are well maintained at T12-L1. At L1-L2, there is mild disc bulge. The spinal canal and foramina are patent.    At L2-3, there is diffuse disc bulge with hypertrophic facet changes and ligamentum flavum hypertrophy which together result in mild central canal stenosis with partial effacement of the lateral recesses. Moderate to severe right and moderate left foraminal stenosis are present.    At L3-4, there is diffuse disc bulge with prior laminectomy. There is no significant central canal stenosis. The foramina are patent.    At L4-5, there is diffuse disc bulge with osteophytic spurring and hypertrophic facet changes. Prior posterior decompression is noted. Central canal stenosis is minimal free there is partial effacement of the  right lateral recess. Mild bilateral foraminal narrowing is present.    At L5-S1, there is diffuse disc bulge with hypertrophic facet changes. There is no significant central canal stenosis. Mild to moderate right foraminal narrowing is present. The left neural foramen is patent.    The conus medullaris ends appropriately at the L1 level. There is no abnormal signal in the conus medullaris. The visualized portions of the cauda equina nerve roots are unremarkable. Mild enhancement is noted in the lower lumbar operative bed with minimal loculated fluid in the laminectomy bed and also at the junction of the subcutaneous tissues and the deep fascia.  The significance of the MRI was discussed with the patient as well as interventional techniques which could be helpful. A lumbar epidural steroid injection at L4-5 will be arranged.  A DDS lumbar traction belt will also be obtained.    She is also experiencing increasingly severe radicular symptoms in the upper extremities. An MRI of the cervical spine up will be arranged.    Physical therapy and aqua therapy will also be scheduled.    She has also recently been evaluated by a neurologist secondary to complaints pertaining to short-term memory loss. Workup is underway, including an MRI of the brain.    Pain level today 8/10, and outcome 17/28, (The lower the upper number, the better the outcome)  Physical activity and mobility, mood, and sleep were all poor.  No reported side effects.        Review of Systems   Constitutional: Positive for malaise/fatigue.   Gastrointestinal: Positive for heartburn, nausea and constipation.   Musculoskeletal: Positive for myalgias, back pain, joint pain and neck pain.   Neurological: Positive  for weakness (generalized).   Psychiatric/Behavioral: The patient has insomnia.    All other systems reviewed and are negative.      Physical Exam   Constitutional: She is oriented to person, place, and time. She appears distressed.   HENT:    Head: Normocephalic and atraumatic.   Right Ear: External ear normal.   Left Ear: External ear normal.   Nose: Nose normal.   Mouth/Throat: Oropharynx is clear and moist. No oropharyngeal exudate.   Eyes: Conjunctivae and EOM are normal. Pupils are equal, round, and reactive to light. Right eye exhibits no discharge. Left eye exhibits no discharge. No scleral icterus.   Neck: Spinous process tenderness and muscular tenderness present. Decreased range of motion present. No thyromegaly present.   Cardiovascular: Normal rate, regular rhythm and normal heart sounds.    Pulmonary/Chest: Effort normal and breath sounds normal. No respiratory distress. She has no wheezes. She has no rales.   Abdominal: Soft. She exhibits no distension. There is no tenderness. There is no rebound and no guarding.   Musculoskeletal: She exhibits tenderness.        Right shoulder: She exhibits decreased range of motion, tenderness and bony tenderness.        Left shoulder: She exhibits decreased range of motion, tenderness and bony tenderness.        Right elbow: She exhibits decreased range of motion. Tenderness found.        Left elbow: She exhibits decreased range of motion. Tenderness found.        Right wrist: She exhibits decreased range of motion, tenderness and bony tenderness.        Left wrist: She exhibits decreased range of motion, tenderness and bony tenderness.        Lumbar back: She exhibits decreased range of motion, tenderness, pain and spasm.   Neurological: She is alert and oriented to person, place, and time. She has normal reflexes. No cranial nerve deficit or sensory deficit. She exhibits normal muscle tone. Gait abnormal. Coordination normal.   Reflex Scores:       Tricep reflexes are 2+ on the right side and 2+ on the left side.       Bicep reflexes are 2+ on the right side and 2+ on the left side.       Brachioradialis reflexes are 2+ on the right side and 2+ on the left side.        Patellar reflexes are 2+ on the right side and 2+ on the left side.       Achilles reflexes are 2+ on the right side and 2+ on the left side.  Skin: Skin is warm. No rash noted.   Psychiatric: She has a normal mood and affect. Her behavior is normal. Judgment and thought content normal.   Nursing note and vitals reviewed.      ASSESSMENT and PLAN  Encounter Diagnoses   Name Primary?   ??? Cervical radiculopathy Yes   ??? Lumbar radiculopathy    ??? Non-restorative sleep    ??? Primary osteoarthritis involving multiple joints    ??? Chronic pain syndrome    ??? Insomnia, unspecified type    ??? Lumbar post-laminectomy syndrome      MS Contin will be increased to 30 mg twice daily for long acting pain control.  Hydromorphone will be continued at 2 mg, 4 times daily as needed for breakthrough pain  An MRI of the cervical spine will be arranged as noted above.  A DDS  lumbar traction belt.  Lumbar epidural steroid injection  2 months reassess    No concerns are raised for misuse, abuse, or diversion.    1. Pain medications are prescribed with the objective of pain relief and improved physical and psychosocial function in this patient.  2. Counseled patient on proper use of prescribed medications and reviewed opioid contract.  3. Counseled patient about chronic medical conditions and their relationship to anxiety and depression and recommended mental health support as needed.  4. Reviewed with patient self-help tools, home exercise, and lifestyle changes to assist the patient in self-management of symptoms.  5. Advised patient to have a primary care provider to continue care for health maintenance and general medical conditions and support for referral to specialty care as needed.  6. Reviewed with patient the treatment plan, goals of treatment plan, and limitations of treatment plan, to include the potential for side effects from medications and procedures. If side effects occur, it is the  responsibility of the patient to inform the clinic so that a change in the treatment plan can be made in a safe manner. The patient is advised that stopping prescribed medication may cause an increase in symptoms and possible medication withdrawal symptoms. The patient is informed an emergency room evaluation may be necessary if this occurs.  DISPOSITION: The patient???s condition and plan were discussed at length and all questions were answered. The patient agrees with the plan.    Counseling occupied > 50% of visit:  Total time: 40 minutes

## 2014-07-17 NOTE — Patient Instructions (Addendum)
Glucosamine sulfate 1500 mg daily    Current health maintenance issues were reviewed and the patient was advised to followup with his/her PCP for completion of these items.
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
## 2014-07-19 MED ORDER — ESOMEPRAZOLE MAGNESIUM 40 MG CAP, DELAYED RELEASE
40 mg | ORAL_CAPSULE | ORAL | Status: DC
Start: 2014-07-19 — End: 2014-09-15

## 2014-07-24 NOTE — H&P (View-Only) (Signed)
Paperwork for PT, aqua, faxed to [facility] scheduling [facility], her MRI paperwork [facility]. [facility]. They will schedule directly with pt.

## 2014-07-24 NOTE — Progress Notes (Signed)
Paperwork for PT, aqua, faxed to [facility] for scheduling at [facility], her MRI paperwork was faxed to [facility] for scheduling at [facility]. Her back brace order was faxed to [facility]. They will schedule directly with pt.

## 2014-07-26 ENCOUNTER — Ambulatory Visit: Admit: 2014-07-26 | Payer: PRIVATE HEALTH INSURANCE | Primary: Internal Medicine

## 2014-07-26 ENCOUNTER — Encounter
Admit: 2014-07-27 | Discharge: 2014-07-27 | Payer: PRIVATE HEALTH INSURANCE | Attending: Anesthesiology | Primary: Internal Medicine

## 2014-07-26 ENCOUNTER — Inpatient Hospital Stay: Payer: PRIVATE HEALTH INSURANCE

## 2014-07-26 MED ORDER — BETAMETHASONE ACET & SOD PHOS 6 MG/ML SUSP FOR INJECTION
6 mg/mL | INTRAMUSCULAR | Status: AC
Start: 2014-07-26 — End: ?

## 2014-07-26 MED ORDER — IOPAMIDOL 41 % INTRATHECAL
200 mg iodine /mL (41 %) | INTRATHECAL | Status: DC | PRN
Start: 2014-07-26 — End: 2014-07-26
  Administered 2014-07-26: 16:00:00

## 2014-07-26 MED ORDER — IOPAMIDOL 41 % INTRATHECAL
200 mg iodine /mL (41 %) | INTRATHECAL | Status: AC
Start: 2014-07-26 — End: ?

## 2014-07-26 MED ORDER — SODIUM CHLORIDE 0.9 % IJ SYRG
INTRAMUSCULAR | Status: DC | PRN
Start: 2014-07-26 — End: 2014-07-26

## 2014-07-26 MED ORDER — BETAMETHASONE ACET & SOD PHOS 6 MG/ML SUSP FOR INJECTION
6 mg/mL | INTRAMUSCULAR | Status: DC | PRN
Start: 2014-07-26 — End: 2014-07-26
  Administered 2014-07-26: 16:00:00

## 2014-07-26 MED ORDER — LIDOCAINE (PF) 10 MG/ML (1 %) IJ SOLN
10 mg/mL (1 %) | INTRAMUSCULAR | Status: DC | PRN
Start: 2014-07-26 — End: 2014-07-26
  Administered 2014-07-26: 16:00:00

## 2014-07-26 MED ORDER — DIAZEPAM 5 MG TAB
5 mg | ORAL | Status: DC
Start: 2014-07-26 — End: 2014-07-26

## 2014-07-26 MED ORDER — DIAZEPAM 5 MG TAB
5 mg | Freq: Once | ORAL | Status: AC
Start: 2014-07-26 — End: 2014-07-26
  Administered 2014-07-26: 15:00:00 via ORAL

## 2014-07-26 MED ORDER — LIDOCAINE (PF) 10 MG/ML (1 %) IJ SOLN
10 mg/mL (1 %) | INTRAMUSCULAR | Status: AC
Start: 2014-07-26 — End: ?

## 2014-07-26 MED ORDER — SODIUM CHLORIDE 0.9 % IJ SYRG
INTRAMUSCULAR | Status: DC | PRN
Start: 2014-07-26 — End: 2014-07-26
  Administered 2014-07-26: 16:00:00

## 2014-07-26 MED ORDER — MIDAZOLAM 1 MG/ML IJ SOLN
1 mg/mL | INTRAMUSCULAR | Status: DC | PRN
Start: 2014-07-26 — End: 2014-07-26

## 2014-07-26 MED FILL — BETAMETHASONE ACET & SOD PHOS 6 MG/ML SUSP FOR INJECTION: 6 mg/mL | INTRAMUSCULAR | Qty: 2

## 2014-07-26 MED FILL — LIDOCAINE (PF) 10 MG/ML (1 %) IJ SOLN: 10 mg/mL (1 %) | INTRAMUSCULAR | Qty: 30

## 2014-07-26 MED FILL — DIAZEPAM 5 MG TAB: 5 mg | ORAL | Qty: 1

## 2014-07-26 MED FILL — ISOVUE-M 200  41 % INTRATHECAL SOLUTION: 200 mg iodine /mL (41 %) | INTRATHECAL | Qty: 20

## 2014-07-26 MED FILL — BD POSIFLUSH NORMAL SALINE 0.9 % INJECTION SYRINGE: INTRAMUSCULAR | Qty: 10

## 2014-07-26 NOTE — Interval H&P Note (Signed)
H&P Update:  Bridget SparrowJanet Mize Morton was seen and examined.  History and physical has been reviewed. The patient has been examined. There have been no significant clinical changes since the completion of the originally dated History and Physical.    Signed By: Seth BakeJames H Nyasia Baxley, MD     July 26, 2014 10:21 AM

## 2014-07-26 NOTE — Procedures (Addendum)
[facility]    PROCEDURE REPORT      PATIENT:  Bridget SparrowJanet Mize Morton  DATE OF BIRTH:  Jul 02, 1955  DATE OF SERVICE:  07/26/2014  S[facility]  [facility]    PRE-PROCEDURE DIAGNOSIS:  Lumbar spondylosis without myelopathy or radiculopathy [M47.816]                                                            Lumbosacral spondylosis without myelopathy or radiculopathy [M47.817]                                                            Lumbar neuritis [M54.16]                                                            Lumbosacral DDD [M51.37]     POST-PROCEDURE DIAGNOSIS:  Lumbar spondylosis without myelopathy or radiculopathy [M47.816]                                                               Lumbosacral spondylosis without myelopathy or radiculopathy [M47.817]                                                               Lumbar neuritis [M54.16]                                                               Lumbosacral DDD [M51.37]                 PROCEDURE:    1. Caudal epidural steroid injection ([id number])  2. Fluoroscopic needle guidance (spinal) ([id number])        3.   Supervision of moderate sedation [phone]([id number])    ANESTHESIA:  Local with moderate IV sedation. See Medication Administration Record for specific medications and dosage.    COMPLICATIONS: None.      PHYSICIAN:  Seth BakeJames H Sumaya Riedesel, MD    PRE-PROCEDURE NOTE:  Pre-procedural assessment of the patient was performed including a limited history and physical examination.  The details of the procedure were discussed with the patient, including the risks, benefits and alternative options and an informed consent was obtained. The patient???s NPO status, if necessary for the specific procedure  and/or administration of moderate intravenous sedation, if utilized, and availability of a responsible adult to escort the patient following the procedure were confirmed.     PROCEDURE NOTE:  The patient was brought to the procedure suite and positioned on the fluoroscopy table in the prone position. Physiologic monitors were applied and supplemental oxygen was administered via nasal cannula. The skin was prepped in the standard surgical fashion and sterile drapes were applied over the procedure site. Please refer to the Flowsheet for documentation of the patient???s vital signs and the Medication Administration Record for any oral and/or intravenous sedation administered prior to or during the procedure.       The sacral hiatus was identified and the skin and subcutaneous tissues were infiltrated with 1% Lidocaine.  Under lateral fluoroscopic guidance, a 22g, 3.5-inch short bevel needle was advanced through the sacrococcygeal ligament into the sacral epidural space.      Correct placement was confirmed by observing the injection of 2 mL of a nonionic water soluble, radiographic contrast media (Isovue-M 200) in both lateral and AP fluoroscopic views.       Following this, a 10 mL solution comprised of  5cc Lidocaine 1% and 3 ml PFNS and betamethasone 2ml (6mg /ml) was injected slowly through the needle.  The needle was cleared of steroid solution and removed. The area was thoroughly cleaned and sterile bandages applied as necessary. The patient tolerated the procedure well and vital signs remained stable throughout the procedure.    POST-PROCEDURE COURSE:   The patient was escorted from the procedure suite in satisfactory condition and recovered per facility protocol based on the type of procedure performed and/or the sedation utilized. The patient did not experience any adverse events and remained hemodynamically stable during the post-procedure period.    DISCHARGE NOTE:  Upon discharge, the patient was able to tolerate fluids and was in no acute distress.  The patient was oriented to person, place and time and vital signs were stable. Appropriate post-procedure  instructions were provided and explained to the patient in detail and all questions were answered.    Seth BakeJames H Earleen Aoun, MD 07/26/2014 12:00 PM

## 2014-07-26 NOTE — Procedures (Signed)
[facility]    PROCEDURE REPORT      PATIENT:  Bridget Morton  DATE OF BIRTH:  Jul 02, 1955  DATE OF SERVICE:  07/26/2014  [facility]    PRE-PROCEDURE DIAGNOSIS:  Lumbar spondylosis without myelopathy or radiculopathy [M47.816]                                                            Lumbosacral spondylosis without myelopathy or radiculopathy [M47.817]                                                            Lumbar neuritis [M54.16]                                                            Lumbosacral DDD [M51.37]     POST-PROCEDURE DIAGNOSIS:  Lumbar spondylosis without myelopathy or radiculopathy [M47.816]                                                               Lumbosacral spondylosis without myelopathy or radiculopathy [M47.817]                                                               Lumbar neuritis [M54.16]                                                               Lumbosacral DDD [M51.37]                 PROCEDURE:    1. Caudal epidural steroid injection ([id number])  2. Fluoroscopic needle guidance (spinal) ([id number])        3.   Supervision of moderate sedation [phone]([id number])    ANESTHESIA:  Local with moderate IV sedation. See Medication Administration Record for specific medications and dosage.    COMPLICATIONS: None.      PHYSICIAN:  Seth BakeJames H Sumaya Riedesel, MD    PRE-PROCEDURE NOTE:  Pre-procedural assessment of the patient was performed including a limited history and physical examination.  The details of the procedure were discussed with the patient, including the risks, benefits and alternative options and an informed consent was obtained. The patient???s NPO status, if necessary for the specific procedure  and/or administration of moderate intravenous sedation, if utilized, and availability of a responsible adult to escort the patient following the procedure were confirmed.    PROCEDURE NOTE:  The  patient was brought to the procedure suite and positioned on the fluoroscopy table in the prone position. Physiologic monitors were applied and supplemental oxygen was administered via nasal cannula. The skin was prepped in the standard surgical fashion and sterile drapes were applied over the procedure site. Please refer to the Flowsheet for documentation of the patient???s vital signs and the Medication Administration Record for any oral and/or intravenous sedation administered prior to or during the procedure.       The sacral hiatus was identified and the skin and subcutaneous tissues were infiltrated with 1% Lidocaine.  Under lateral fluoroscopic guidance, a 22g, 3.5-inch short bevel needle was advanced through the sacrococcygeal ligament into the sacral epidural space.      Correct placement was confirmed by observing the injection of 2 mL of a nonionic water soluble, radiographic contrast media (Isovue-M 200) in both lateral and AP fluoroscopic views.       Following this, a 10 mL solution comprised of  5cc Lidocaine 1% and 3 ml PFNS and betamethasone 2ml ( /ml) was injected slowly through the needle.  The needle was cleared of steroid solution and removed. The area was thoroughly cleaned and sterile bandages applied as necessary. The patient tolerated the procedure well and vital signs remained stable throughout the procedure.    POST-PROCEDURE COURSE:   The patient was escorted from the procedure suite in satisfactory condition and recovered per facility protocol based on the type of procedure performed and/or the sedation utilized. The patient did not experience any adverse events and remained hemodynamically stable during the post-procedure period.    DISCHARGE NOTE:  Upon discharge, the patient was able to tolerate fluids and was in no acute distress.  The patient was oriented to person, place and time and vital signs were stable. Appropriate post-procedure instructions were provided and explained to the  patient in detail and all questions were answered.    Seth Bake, MD 07/26/2014 12:00 PM

## 2014-07-28 ENCOUNTER — Encounter: Payer: PRIVATE HEALTH INSURANCE | Primary: Internal Medicine

## 2014-08-04 ENCOUNTER — Encounter: Payer: PRIVATE HEALTH INSURANCE | Primary: Internal Medicine

## 2014-08-09 ENCOUNTER — Inpatient Hospital Stay: Payer: PRIVATE HEALTH INSURANCE | Primary: Internal Medicine

## 2014-08-16 ENCOUNTER — Ambulatory Visit
Admit: 2014-08-16 | Discharge: 2014-08-16 | Payer: PRIVATE HEALTH INSURANCE | Attending: Internal Medicine | Primary: Internal Medicine

## 2014-08-16 DIAGNOSIS — N3281 Overactive bladder: Secondary | ICD-10-CM

## 2014-08-16 LAB — AMB POC URINALYSIS DIP STICK AUTO W/O MICRO
Blood (UA POC): NEGATIVE
Glucose (UA POC): NEGATIVE
Ketones (UA POC): NEGATIVE
Leukocyte esterase (UA POC): NEGATIVE
Nitrites (UA POC): NEGATIVE
Specific gravity (UA POC): 1.03 (ref 1.001–1.035)
Urobilinogen (UA POC): 1 (ref 0.2–1)
pH (UA POC): 5.5 (ref 4.6–8.0)

## 2014-08-16 MED ORDER — TOLTERODINE SR 4 MG 24 HR CAP
4 mg | ORAL_CAPSULE | Freq: Every day | ORAL | Status: DC
Start: 2014-08-16 — End: 2014-09-07

## 2014-08-16 MED ORDER — POTASSIUM CHLORIDE 10 % ORAL LIQUID
20 mEq/15 mL | Freq: Every day | ORAL | Status: DC
Start: 2014-08-16 — End: 2016-12-22

## 2014-08-16 NOTE — Progress Notes (Signed)
Assessment/Plan:    1. OAB (overactive bladder)  -change oral potassium to liquid given interaction with solid potassium and anticholinergics  - tolterodine ER (DETROL LA) 4 mg ER capsule; Take 1 Cap by mouth daily.  Dispense: 30 Cap; Refill: 0  -discussed possible side effects of dry mouth/constipation    2. Urinary urgency- u/a neg  - AMB POC URINALYSIS DIP STICK AUTO W/O MICRO    3. Daytime somnolence- suspect multifactorial (chronic pain meds, depression, medication side effects, +/- sleep apnea)  -has sleep med appt next month    The plan was discussed with the patient.  The patient verbalized understanding and is in agreement with the plan.  All medication potential side effects were discussed with the patient.    Health Maintenance:   Health Maintenance   Topic Date Due   ??? Hepatitis C Screening  20-Mar-1956   ??? INFLUENZA AGE 52 TO ADULT  11/27/2014   ??? BREAST CANCER SCRN MAMMOGRAM  08/20/2015   ??? COLONOSCOPY  07/14/2018   ??? Td Q 10 Yrs Age > 18  05/21/2023   ??? Tdap Age > 18  Addressed       Bridget Morton is a 59 y.o. female and presents with Fatigue     Subjective:  Pt c/o fatigue and daytime somnolence.  This is an ongoing problem, and in fact had been referred to sleep medicine 2014, but never went.  States she has no energy.  Labs reviewed in [facility] -nml TSH, CBC, CMP, B12, folate 05/2014. States she feels rested upon awakening.  Sleeps well, but requires naps during the daytime.  No morning HA, nocturia.  +snoring per husband.  Has appt with sleep medicine upcoming ([facility]) 5/17.  The pt is convinced it's her thyroid.    States she has urinary urgency and hesitancy x several months.  No dysuria.    No flank pain or f/c.  Has associated incontinence.    ROS:  Constitutional: No recent weight change. No weakness/+fatigue.  No f/c.   Cardiovascular: No CP/palpitations.  No DOE/orthopnea/PND.   Respiratory: No cough/sputum, dyspnea, wheezing.    Gastointestinal: No dysphagia, reflux.  No n/v.  No constipation/diarrhea.  No melena/rectal bleeding.   Genitourinary: + dysuria, + urinary hesitancy, no nocturia, hematuria.  No incontinence.   Endo: No heat/cold intolerance, no polyuria/polydypsia.     The problem list was updated as a part of today's visit.  Patient Active Problem List   Diagnosis Code   ??? Internal hemorrhoid K64.8   ??? OA (osteoarthritis) M19.90   ??? Edema R60.9   ??? Adiposis dolorosa E88.2   ??? Chronic pain G89.29   ??? Iron deficiency E61.1   ??? Fibromyalgia M79.7   ??? Insomnia G47.00   ??? Daytime somnolence R40.0   ??? Diastolic dysfunction I51.9   ??? Chronic constipation K59.09   ??? Dry eyes, bilateral H04.123   ??? Leg cramps R25.2   ??? Dysphagia, oropharyngeal phase R13.12   ??? Depression F32.9   ??? H/O gastric bypass Z98.89   ??? Primary osteoarthritis involving multiple joints M15.0   ??? Lumbar post-laminectomy syndrome M96.1   ??? Cervical radiculopathy M54.12   ??? Lumbar radiculopathy M54.16   ??? Non-restorative sleep G47.8   ??? Fatigue R53.83       The PSH, FH were reviewed.    SH:  History   Substance Use Topics   ??? Smoking status: Former Smoker -- 10 years     Types: Cigarettes  Quit date: 12/16/2010   ??? Smokeless tobacco: Never Used   ??? Alcohol Use: Yes      Comment: seldom       Medications/Allergies:  Current Outpatient Prescriptions on File Prior to Visit   Medication Sig Dispense Refill   ??? BUPROPION HCL (WELLBUTRIN PO) Take  by mouth daily.     ??? esomeprazole (NEXIUM) 40 mg capsule TAKE 1 CAPSULE BY MOUTH EVERY DAY 90 Cap 6   ??? morphine CR (MS CONTIN) 30 mg CR tablet Take 1 Tab by mouth two (2) times a day for 30 days. Max Daily Amount: 60 mg. Indications: CHRONIC PAIN, SEVERE PAIN 60 Tab 0   ??? pregabalin (LYRICA) 100 mg capsule Take 1 capsule every morning and 2 capsules every evening 90 Cap 1   ??? traZODone (DESYREL) 100 mg tablet TAKE 1 TABLET BY MOUTH EVERY NIGHT AT BEDTIME. GENERIC FOR DESYREL. 90 Tab 1    ??? potassium chloride SR (KLOR-CON 8) 8 mEq tablet TAKE 1 TABLET BY MOUTH TWICE DAILY. GENERIC FOR KLOR-CON. 180 Tab 1   ??? furosemide (LASIX) 20 mg tablet TAKE 1 TABLET BY MOUTH TWICE DAILY. GENERIC FOR LASIX. 180 Tab 1   ??? venlafaxine (EFFEXOR) 75 mg tablet TAKE 1 TABLET BY MOUTH TWICE DAILY 60 Tab 6   ??? fluticasone (FLONASE) 50 mcg/actuation nasal spray SPRAY 2 SPRAYS IN BOTH NOSTRILS DAILY 3 Bottle 3   ??? linaclotide (LINZESS) 145 mcg cap capsule Take  by mouth daily as needed.     ??? celecoxib (CELEBREX) 200 mg capsule Take 200 mg by mouth daily. 60 Cap 2   ??? montelukast (SINGULAIR) 10 mg tablet Take 10 mg by mouth daily.     ??? multivitamin (ONE A DAY) tablet Take 1 Tab by mouth daily.     ??? calcium citrate-vitamin D3 (CITRACAL WITH VITAMIN D MAXIMUM) tablet Take 2 Tabs by mouth two (2) times a day.     ??? cyanocobalamin 1,000 mcg Subl by SubLINGual route.       No current facility-administered medications on file prior to visit.      No Known Allergies    Objective:  BP 118/74 mmHg   Pulse 85   Temp(Src) 98 ??F (36.7 ??C) (Oral)   Resp 20   Ht 5\' 3"  (1.6 m)   Wt 237 lb (107.502 kg)   BMI 41.99 kg/m2   SpO2 97%   Constitutional: Well developed, nourished, no distress, alert, obese habitus   Eyes: Conjunctiva normal. PERRL.    CV: S1, S2.  RRR.  No murmurs/rubs.  No thrills palpated.  No carotid bruits.  Intact distal pulses.  No edema.   Pulm: No abnormalities on inspection.  Clear to auscultation bilaterally. No wheezing/rhonchi.  Normal effort.     GI: Soft, nontender, nondistended.  Normal active bowel sounds. No CVA tenderness

## 2014-08-16 NOTE — Patient Instructions (Signed)
Tolterodine (By mouth)   Tolterodine Tartrate (tol-TER-oh-deen TAR-trate)  Treats symptoms of an overactive bladder such as incontinence (loss of bladder control) or a frequent need to urinate by helping you have more control over the need to urinate.   Brand Name(s):Detrol, Detrol LA   There may be other brand names for this medicine.  When This Medicine Should Not Be Used:   You should not use this medicine if you had an allergic reaction to tolterodine or to fesoterodine fumarate (Toviaz??). Do not use this medicine if you have problems passing urine, if you have narrow-angle glaucoma that is not under control, or if you have problems with food emptying from your stomach (gastric retention).  How to Use This Medicine:   Long Acting Capsule, Tablet  ?? Take your medicine as directed. Your dose may need to be changed several times to find what works best for you.  ?? You may take this medicine with or without food. Drink a full glass (8 ounces) of water or other liquid when taking your medicine.  ?? Swallow the extended-release capsule whole. Do not crush, break, or chew it.  ?? Take this medicine at the same time each day.  ?? Read and follow the patient instructions that come with this medicine. Talk to your doctor or pharmacist if you have any questions.  If a dose is missed:   ?? Take a dose as soon as you remember. If it is almost time for your next dose, wait until then and take a regular dose. Do not take extra medicine to make up for a missed dose.  How to Store and Dispose of This Medicine:   ?? Store the medicine in a closed container at room temperature, away from heat, moisture, and direct light.  ?? Ask your pharmacist, doctor, or health caregiver about the best way to dispose of any outdated medicine or medicine no longer needed.  ?? Keep all medicine out of the reach of children. Never share your medicine with anyone.  Drugs and Foods to Avoid:    Ask your doctor or pharmacist before using any other medicine, including over-the-counter medicines, vitamins, and herbal products.  ?? Make sure your doctor knows if you are also using cyclosporine (Gengraf??, Neoral??, or Sandimmune??) or vinblastine (Velban??). Tell your doctor if you are using medicine for heart rhythm problems (such as amiodarone, procainamide, quinidine, sotalol, Betapace??, Cardioquin??, Cordarone??, Procanbid??, Pronestyl??, or Quinaglute??), medicine to treat HIV/AIDS (such as atazanavir, ritonavir, saquinavir, Fortovase??, Invirase??, Norvir??, or Reyataz??), or any medicine to treat an infection (such as clarithromycin, erythromycin, itraconazole, ketoconazole, miconazole, Biaxin??, Erythro-Tab??, Monistat??, Nizoral??, or Sporanox??).  Warnings While Using This Medicine:   ?? Make sure your doctor knows if you are pregnant or breastfeeding, or if you have kidney disease, liver disease (including cirrhosis), myasthenia gravis (severe muscle weakness), narrow-angle glaucoma, stomach or bowel problems (such as blockage in your intestines, intestinal atony), or urinary bladder problems (such as blockage). Tell your doctor if you have a heart condition called QT prolongation, or a family member with this condition.  ?? This medicine may cause serious types of allergic reactions, including anaphylaxis and angioedema. Anaphylaxis and angioedema can be life-threatening and requires immediate medical attention. Stop using this medicine and call your doctor right away if you have a rash; itching; hoarseness; lightheadedness, dizziness, or fainting; trouble breathing; trouble swallowing; or any swelling of your hands, face, mouth, or throat after using this medicine.  ?? This medicine may make you dizzy, drowsy,  or cause blurred vision. Avoid driving, using machines, or doing anything else that could be dangerous if you are not alert or not able to see well.   ?? Do not change your dose without checking first with your doctor.  ?? Your doctor will check your progress and the effects of this medicine at regular visits. Keep all appointments.  Possible Side Effects While Using This Medicine:   Call your doctor right away if you notice any of these side effects:  ?? Allergic reaction: Itching or hives, swelling in your face or hands, swelling or tingling in your mouth or throat, chest tightness, trouble breathing  ?? Chest pain.  ?? Decrease in how much or how often you urinate.  ?? Fast or pounding heartbeat.  ?? Fever, chills, cough, sore throat, and body aches.  ?? Rapid weight gain.  ?? Seeing or hearing things that are not really there.  ?? Swelling in your hands, ankles, or feet.  ?? Unusual tiredness or weakness.  If you notice these less serious side effects, talk with your doctor:   ?? Blurred vision.  ?? Constipation, stomach pain or upset.  ?? Dizziness.  ?? Dry mouth, skin, or eyes.  ?? Feeling of constant movement of self or surroundings.  ?? Headache.  ?? Joint or muscle pain.  ?? Sleepiness or unusual drowsiness.  ?? Stuffy or runny nose.  If you notice other side effects that you think are caused by this medicine, tell your doctor.   Call your doctor for medical advice about side effects. You may report side effects to FDA at 1-800-FDA-1088  ?? 2014 [facility] [employer] Information is for End User's use only and may not be sold, redistributed or otherwise used for commercial purposes.  The above information is an educational aid only. It is not intended as medical advice for individual conditions or treatments. Talk to your doctor, nurse or pharmacist before following any medical regimen to see if it is safe and effective for you.

## 2014-08-16 NOTE — Progress Notes (Signed)
Pt in today for fatigue. 1. Have you been to the ER, urgent care clinic since your last visit?  Hospitalized since your last visitno    2. Have you seen or consulted any other health care providers outside of the [facility] since your last visit?  [facility]. Dr Susa GriffinsMadsen neurology , pain management Dr Little IshikawaHenrick , [place]thalmology, [place] [facility]   Dr Dellia Beckwithenzon psychiatry

## 2014-08-24 MED ORDER — LYRICA 100 MG CAPSULE
100 mg | ORAL_CAPSULE | ORAL | Status: DC
Start: 2014-08-24 — End: 2014-11-30

## 2014-08-24 MED ORDER — PREGABALIN 100 MG CAP
100 mg | ORAL_CAPSULE | ORAL | Status: DC
Start: 2014-08-24 — End: 2014-08-24

## 2014-08-24 NOTE — Telephone Encounter (Signed)
From: Victorino SparrowJanet Mize Morton  To: Kerman PasseySusan Erionna Strum V, MD  Sent: 08/24/2014 9:16 AM EDT  Subject:  Medication Renewal Request    Original  authorizing provider: Kerman PasseySUSAN V Otto Felkins, MD    Victorino SparrowJanet  Mize Getter would like a refill of the following medications:  pregabalin  (LYRICA) 100 mg capsule Kerman Passey[Taylee Gunnells V Paydon Carll, MD]    [facility]  [facility]: [id number] [place], [place] - [address] [facility] [address]  [place]    Comment:  Dr.  Veatrice KellsShultz, I need a new refill prescription for my Lyrica 100mg  capsules. I only have enough for today and tomorrow. I take 1 capsule 3 times a day. Thank you, Bridget Morton

## 2014-09-07 MED ORDER — TOLTERODINE SR 4 MG 24 HR CAP
4 mg | ORAL_CAPSULE | ORAL | Status: DC
Start: 2014-09-07 — End: 2015-02-23

## 2014-09-11 ENCOUNTER — Ambulatory Visit
Admit: 2014-09-11 | Discharge: 2014-09-11 | Payer: PRIVATE HEALTH INSURANCE | Attending: Medical | Primary: Internal Medicine

## 2014-09-11 DIAGNOSIS — M5412 Radiculopathy, cervical region: Secondary | ICD-10-CM

## 2014-09-11 MED ORDER — OXYMORPHONE ER 15 MG TABLET,CRUSH RESISTANT,EXTENDED RELEASE 12 HR
15 mg | Freq: Two times a day (BID) | ORAL | Status: DC
Start: 2014-09-11 — End: 2014-11-10

## 2014-09-11 MED ORDER — HYDROMORPHONE 2 MG TAB
2 mg | ORAL_TABLET | Freq: Four times a day (QID) | ORAL | Status: DC | PRN
Start: 2014-09-11 — End: 2014-09-11

## 2014-09-11 MED ORDER — OXYMORPHONE ER 15 MG TABLET,CRUSH RESISTANT,EXTENDED RELEASE 12 HR
15 mg | Freq: Two times a day (BID) | ORAL | Status: DC
Start: 2014-09-11 — End: 2014-09-11

## 2014-09-11 MED ORDER — HYDROMORPHONE 2 MG TAB
2 mg | ORAL_TABLET | Freq: Four times a day (QID) | ORAL | Status: DC | PRN
Start: 2014-09-11 — End: 2014-11-10

## 2014-09-11 NOTE — Progress Notes (Signed)
Nursing Notes    Patient presents to the office today in follow-up.   Patient rates her pain at 3/10 on the numerical pain scale.     Reviewed medications with counts as follows:    Rx Date filled Qty Dispensed Pill Count Last Dose Short   Dilaudid 2mg  08/15/14 120 25 today no   Ms Contin 30mg  08/15/14 60 6 today no                              Comments:     POC UDS NOT performed in office today    Any new labs or imaging since last appointment? no    Have you been to an emergency room (ER) or urgent care clinic since your last visit?  no           Have you been hospitalized since your last visit? no  If yes, where, when, and reason for visit?     Have you seen or consulted any other health care providers outside [facility]  since your last visit? no  If yes, where, when, and reason for visit?   Ms. Gavin PoundLowder has a reminder for a "due or due soon" health maintenance. I have asked that she contact her primary care provider for follow-up on this health maintenance.  Amanda PeaBrandy L Altin Sease LPN

## 2014-09-11 NOTE — Progress Notes (Signed)
HISTORY OF PRESENT ILLNESS  Bridget Morton is a 59 y.o. female. Patient presents for follow up of chronic, severe pain involving the cervical region with radiation to both upper extremity as well as the lumbar spine with radiation to both lower extremities. She is s/p lumbar fusion/ lumbar post-laminectomy syndrome.   She remains frustrated at her continued weight gain which has accelerated following quitting her job two years ago. She is s/p gastric bypass in 2004, which at that time resulted in weight loss of 150 lbs. However, she has recently regained 80 lbs, which she reports is in spite of diligent attention to caloric intake. She is amenable to consulting with Dr. Drue SecondSnider for medically-supervised weight loss.  She also complains of significant worsening of chronic fatigue and daytime sleepiness. She believes this is due to her current medications, which include Dilaudid 2 mg and MS Contin 30 mg BID. She has history of sleep apnea, but she reports that this resolved after her weight loss surgery twelve years ago. We discussed that it is possible for OSA to recur with weight gain. She mentions that she has a consult with a sleep specialist next week (Dr. Jimmie MollyGreenberg).   Neck and back pain remain constant and profound. She describes her pain as aching, stabbing, stiff, and tender. Symptoms will often radiate down the neck into the arms/hands. MRI of the cervical spine performed recently revealed spondylosis at C5/C6 producing foraminal stenosis bilaterally. We discussed scheduling a cervical ESI to address neck and arm pain. She will consider this. Shoulder pain also remains problematic, and she has been told that she needs a total reverse arthroplasty on the right, but she had to cancel her surgery last year when her son was in a serous car accident. She has not yet rescheduled this. Pt reports average of 7/10 pain scale most days.   While ms contin and dilaudid remain very helpful for pain, she is  concerned about fatigue, sleepiness, and memory problems that have worsened since she started this. We had a protracted discussion regarding treatment options; she had been on oxycontin with oxycodone for breakthrough before she came to this office. She requests to go back to OxyContin and see if this makes a difference in her fatigue.   She denies any falls, injuries, or hospitalizations since the last visit.     A total of 50 minutes was spent with the patient of which more than 50% of the time was spent counseling the patient.   HPI--see above    ROS  Constitutional: Positive for malaise/fatigue.   Gastrointestinal: Positive for heartburn, nausea and constipation.   Musculoskeletal: Positive for myalgias, back pain, joint pain and neck pain.   Neurological: Positive for weakness (generalized).   Psychiatric/Behavioral: The patient has insomnia.    All other systems reviewed and are negative.    Physical Exam  onstitutional: She is oriented to person, place, and time. She appears distressed.   HENT:   Head: Normocephalic and atraumatic.   Right Ear: External ear normal.   Left Ear: External ear normal.   Nose: Nose normal.   Mouth/Throat: Oropharynx is clear and moist. No oropharyngeal exudate.   Eyes: Conjunctivae and EOM are normal. Pupils are equal, round, and reactive to light. Right eye exhibits no discharge. Left eye exhibits no discharge. No scleral icterus.   Neck: Spinous process tenderness and muscular tenderness present. Decreased range of motion present. No thyromegaly present.   Cardiovascular: Normal rate, regular rhythm and normal heart sounds.  Pulmonary/Chest: Effort normal and breath sounds normal. No respiratory distress. She has no wheezes. She has no rales.   Abdominal: Soft. She exhibits no distension. There is no tenderness. There is no rebound and no guarding.   Musculoskeletal: She exhibits tenderness.        Right shoulder: She exhibits decreased range of motion, tenderness  and bony tenderness.        Left shoulder: She exhibits decreased range of motion, tenderness and bony tenderness.        Right elbow: She exhibits decreased range of motion. Tenderness found.        Left elbow: She exhibits decreased range of motion. Tenderness found.        Right wrist: She exhibits decreased range of motion, tenderness and bony tenderness.        Left wrist: She exhibits decreased range of motion, tenderness and bony tenderness.        Lumbar back: She exhibits decreased range of motion, tenderness, pain and spasm.   Neurological: She is alert and oriented to person, place, and time. She has normal reflexes. No cranial nerve deficit or sensory deficit. She exhibits normal muscle tone. Gait abnormal. Coordination normal.   Reflex Scores:       Tricep reflexes are 2+ on the right side and 2+ on the left side.       Bicep reflexes are 2+ on the right side and 2+ on the left side.       Brachioradialis reflexes are 2+ on the right side and 2+ on the left side.       Patellar reflexes are 2+ on the right side and 2+ on the left side.       Achilles reflexes are 2+ on the right side and 2+ on the left side.  Skin: Skin is warm. No rash noted.   Psychiatric: She has a normal mood and affect. Her behavior is normal. Judgment and thought content normal.   Nursing note and vitals reviewed.  ASSESSMENT and PLAN    ICD-10-CM ICD-9-CM    1. Cervical radiculopathy M54.12 723.4    2. Abnormal weight gain R63.5 783.1 REFERRAL TO WEIGHT LOSS   3. Morbid obesity due to excess calories (HCC) E66.01 278.01 REFERRAL TO WEIGHT LOSS   4. Lumbar radiculopathy M54.16 724.4    5. Fibromyalgia M79.7 729.1    6. Daytime somnolence R40.0 780.54    7. Chronic constipation K59.09 564.00    8. Cramp of both lower extremities R25.2 729.82    9. H/O gastric bypass Z98.89 V45.86    10. Primary osteoarthritis involving multiple joints M15.0 715.09    11. Lumbar post-laminectomy syndrome M96.1 722.83       Plan:   Continue same medications as prescribed for chronic pain  We can refer you to Dr. Wilburn MylarPhilip Snider to discuss your recent weight gain and to start medically-supervised weight loss  Follow up in 1 months or sooner if needed  Regular exercise and attention to emotional health and diet remain the most effective ways to treat chronic pain of all kinds  You may contact me with questions or concerns through MyChart

## 2014-09-11 NOTE — Patient Instructions (Signed)
Plan:  Continue same medications as prescribed for chronic pain  We can refer you to Dr. Wilburn MylarPhilip Snider to discuss your recent weight gain and to start medically-supervised weight loss  Follow up in 1 months or sooner if needed  Regular exercise and attention to emotional health and diet remain the most effective ways to treat chronic pain of all kinds  You may contact me with questions or concerns through MyChart

## 2014-09-15 MED ORDER — ESOMEPRAZOLE MAGNESIUM 40 MG CAP, DELAYED RELEASE
40 mg | ORAL_CAPSULE | ORAL | Status: DC
Start: 2014-09-15 — End: 2014-12-25

## 2014-10-09 ENCOUNTER — Encounter

## 2014-10-11 NOTE — Progress Notes (Signed)
Referral was refaxed over to Dr.[facility]

## 2014-10-20 MED ORDER — FUROSEMIDE 20 MG TAB
20 mg | ORAL_TABLET | ORAL | Status: DC
Start: 2014-10-20 — End: 2016-04-09

## 2014-11-01 ENCOUNTER — Encounter: Attending: Physician Assistant | Primary: Internal Medicine

## 2014-11-07 MED ORDER — TRAZODONE 100 MG TAB
100 mg | ORAL_TABLET | ORAL | Status: DC
Start: 2014-11-07 — End: 2015-01-17

## 2014-11-10 ENCOUNTER — Encounter
Admit: 2014-11-10 | Discharge: 2014-11-10 | Payer: PRIVATE HEALTH INSURANCE | Attending: Medical | Primary: Internal Medicine

## 2014-11-10 ENCOUNTER — Encounter: Attending: Medical | Primary: Internal Medicine

## 2014-11-10 ENCOUNTER — Ambulatory Visit: Attending: Medical | Primary: Internal Medicine

## 2014-11-10 LAB — AMB POC DRUG SCREEN (G0477)
AMPHETAMINES UR POC: NEGATIVE
BARBITURATES UR POC: NEGATIVE
BENZODIAZEPINES UR POC: NEGATIVE
CANNABINOIDS UR POC: NEGATIVE
COCAINE UR POC: NEGATIVE
MDMA/ECSTASY UR POC: NEGATIVE
METHADONE UR POC: NEGATIVE
METHAMPHETAMINE UR POC: NEGATIVE
METHYLPHENIDATE UR POC: NEGATIVE
OPIATES UR POC: NEGATIVE
OXYCODONE UR POC: NEGATIVE
TRICYCLICS UR POC: NEGATIVE

## 2014-11-10 MED ORDER — OXYMORPHONE ER 15 MG TABLET,CRUSH RESISTANT,EXTENDED RELEASE 12 HR
15 mg | Freq: Two times a day (BID) | ORAL | Status: DC
Start: 2014-11-10 — End: 2014-11-27

## 2014-11-10 MED ORDER — HYDROMORPHONE 2 MG TAB
2 mg | ORAL_TABLET | Freq: Four times a day (QID) | ORAL | Status: AC | PRN
Start: 2014-11-10 — End: 2014-12-10

## 2014-11-10 NOTE — Progress Notes (Signed)
Pt will need bridge script due to lateness to appt. PMP showed no aberrancies and no related FYI's. Pt gave UDS.

## 2014-11-10 NOTE — Telephone Encounter (Signed)
Patient received bridge prescription.

## 2014-11-15 ENCOUNTER — Ambulatory Visit
Admit: 2014-11-15 | Discharge: 2014-11-15 | Payer: PRIVATE HEALTH INSURANCE | Attending: Internal Medicine | Primary: Internal Medicine

## 2014-11-15 ENCOUNTER — Inpatient Hospital Stay: Admit: 2014-11-15 | Payer: PRIVATE HEALTH INSURANCE | Primary: Internal Medicine

## 2014-11-15 DIAGNOSIS — L989 Disorder of the skin and subcutaneous tissue, unspecified: Secondary | ICD-10-CM

## 2014-11-15 LAB — CBC WITH AUTOMATED DIFF
ABS. BASOPHILS: 0.1 10*3/uL — ABNORMAL HIGH (ref 0.0–0.06)
ABS. EOSINOPHILS: 0.1 10*3/uL (ref 0.0–0.4)
ABS. LYMPHOCYTES: 1 10*3/uL (ref 0.9–3.6)
ABS. MONOCYTES: 0.4 10*3/uL (ref 0.05–1.2)
ABS. NEUTROPHILS: 2.9 10*3/uL (ref 1.8–8.0)
BASOPHILS: 1 % (ref 0–2)
EOSINOPHILS: 3 % (ref 0–5)
HCT: 38.7 % (ref 35.0–45.0)
HGB: 12.4 g/dL (ref 12.0–16.0)
LYMPHOCYTES: 22 % (ref 21–52)
MCH: 27 PG (ref 24.0–34.0)
MCHC: 32 g/dL (ref 31.0–37.0)
MCV: 84.1 FL (ref 74.0–97.0)
MONOCYTES: 9 % (ref 3–10)
MPV: 10.6 FL (ref 9.2–11.8)
NEUTROPHILS: 65 % (ref 40–73)
PLATELET: 171 10*3/uL (ref 135–420)
RBC: 4.6 M/uL (ref 4.20–5.30)
RDW: 14.5 % (ref 11.6–14.5)
WBC: 4.5 10*3/uL — ABNORMAL LOW (ref 4.6–13.2)

## 2014-11-15 LAB — SED RATE (ESR): Sed rate, automated: 9 mm/hr (ref 0–30)

## 2014-11-15 NOTE — Progress Notes (Signed)
Assessment/Plan:    1. Skin lesion- ?delusions of parasites.  Will refer to ID for eval.  Ck labs.  Wound care given severity of L antecubital fossa lesion.  Advised pt shouldn't pick at skin.  Finish antibiotics prescribed by ER.  - REFERRAL TO INFECTIOUS DISEASE  - CBC WITH AUTOMATED DIFF; Future  - SED RATE (ESR); Future  - CRP, HIGH SENSITIVITY; Future  - REFERRAL TO WOUND CARE    The plan was discussed with the patient.  The patient verbalized understanding and is in agreement with the plan.  All medication potential side effects were discussed with the patient.    Health Maintenance:   Health Maintenance   Topic Date Due   ??? Hepatitis C Screening  08/18/1955   ??? DTaP/Tdap/Td series (1 - Tdap) 10/14/1977   ??? INFLUENZA AGE 57 TO ADULT  11/27/2014   ??? BREAST CANCER SCRN MAMMOGRAM  08/20/2015   ??? COLONOSCOPY  07/14/2018       Bridget Morton is a 59 y.o. female and presents with Skin Problem     Subjective:  Was seen in ER 3 days ago for skin lesions and fear that she had a parasite.  Was treated with keflex and bactrim for cellulitis and referred to ID for further eval, although suspicion for parasites was low. Thyroid, cbc and cmp were normal 05/2014.  Today she states "I have been infected with a parasite".  Pt states she can see white protrusions come out of her skin and then will go back into her skin.  She states she has "trails all over her body" where they have gone.  She brings in a bottle with the "parasites". I am unable to tell what it is - appears to be different color small pieces of hard material - ranging from brown to white.  Her husband is not infected.  She states the lesions don't itch, but feel more of a tickling sensation.  She then pointed to a blue piece of fuzz and stated that was one that was trying to bury itself.  No travel or potential exposures.    ROS:  Constitutional: No recent weight change. No weakness/fatigue.  No f/c.   Skin: + rashes, no change in nails/hair, itching    Cardiovascular: No CP/palpitations.  No DOE/orthopnea/PND.   Respiratory: No cough/sputum, dyspnea, wheezing.   Gastointestinal: No dysphagia, reflux.  No n/v.  No constipation/diarrhea.  No melena/rectal bleeding.   Allergy/Immunology: No seasonal rhinitis. Denies frequent colds, sinus/ear infections.   Neurological: No seizures/numbness/weakness.  No paresthesias.     The problem list was updated as a part of today's visit.  Patient Active Problem List   Diagnosis Code   ??? Internal hemorrhoid K64.8   ??? OA (osteoarthritis) M19.90   ??? Edema R60.9   ??? Adiposis dolorosa E88.2   ??? Chronic pain G89.29   ??? Iron deficiency E61.1   ??? Fibromyalgia M79.7   ??? Insomnia G47.00   ??? Daytime somnolence W29.5   ??? Diastolic dysfunction A21.3   ??? Chronic constipation K59.09   ??? Dry eyes, bilateral H04.123   ??? Leg cramps R25.2   ??? Dysphagia, oropharyngeal phase R13.12   ??? Depression F32.9   ??? H/O gastric bypass Z98.89   ??? Primary osteoarthritis involving multiple joints M15.0   ??? Lumbar post-laminectomy syndrome M96.1   ??? Cervical radiculopathy M54.12   ??? Lumbar radiculopathy M54.16   ??? Non-restorative sleep G47.8   ??? Fatigue R53.83  The PSH, FH were reviewed.    SH:  History   Substance Use Topics   ??? Smoking status: Former Smoker -- 10 years     Types: Cigarettes     Quit date: 12/16/2010   ??? Smokeless tobacco: Never Used   ??? Alcohol Use: Yes      Comment: seldom       Medications/Allergies:  Current Outpatient Prescriptions on File Prior to Visit   Medication Sig Dispense Refill   ??? oxymorphone (OPANA ER) 15 mg TR12 Take 1 Tab by mouth every twelve (12) hours. Max Daily Amount: 2 Tabs. for chronic, severe, refractory pain 60 Each 0   ??? HYDROmorphone (DILAUDID) 2 mg tablet Take 1 Tab by mouth four (4) times daily as needed for Pain for up to 30 days. Max Daily Amount: 8 mg. Indications: PAIN 120 Tab 0   ??? traZODone (DESYREL) 100 mg tablet TAKE 1 TABLET BY MOUTH EVERY NIGHT AT BEDTIME 90 Tab 0    ??? furosemide (LASIX) 20 mg tablet TAKE 1 TABLET BY MOUTH TWICE DAILY. GENERIC FOR LASIX. 180 Tab 0   ??? esomeprazole (NEXIUM) 40 mg capsule TAKE 1 CAPSULE BY MOUTH EVERY DAY 90 Cap 1   ??? tolterodine ER (DETROL LA) 4 mg ER capsule TAKE 1 CAPSULE BY MOUTH DAILY 90 Cap 1   ??? LYRICA 100 mg capsule TAKE ONE CAPSULE BY MOUTH EVERY MORNING AND 2 CAPSULES BY MOUTH EVERY EVENING 90 Cap 0   ??? potassium chloride (KAON 10%) 20 mEq/15 mL solution Take 5 mL by mouth daily. 480 mL 0   ??? BUPROPION HCL (WELLBUTRIN PO) Take  by mouth daily.     ??? venlafaxine (EFFEXOR) 75 mg tablet TAKE 1 TABLET BY MOUTH TWICE DAILY 60 Tab 6   ??? fluticasone (FLONASE) 50 mcg/actuation nasal spray SPRAY 2 SPRAYS IN BOTH NOSTRILS DAILY 3 Bottle 3   ??? linaclotide (LINZESS) 145 mcg cap capsule Take  by mouth daily as needed.     ??? celecoxib (CELEBREX) 200 mg capsule Take 200 mg by mouth daily. 60 Cap 2   ??? montelukast (SINGULAIR) 10 mg tablet Take 10 mg by mouth daily.     ??? multivitamin (ONE A DAY) tablet Take 1 Tab by mouth daily.     ??? calcium citrate-vitamin D3 (CITRACAL WITH VITAMIN D MAXIMUM) tablet Take 2 Tabs by mouth two (2) times a day.     ??? cyanocobalamin 1,000 mcg Subl by SubLINGual route.       No current facility-administered medications on file prior to visit.        No Known Allergies    Objective:  BP 128/82 mmHg   Pulse 118   Temp(Src) 98.8 ??F (37.1 ??C) (Oral)   Resp 20   Ht 5' 3"  (1.6 m)   Wt 232 lb (105.235 kg)   BMI 41.11 kg/m2   SpO2 97%   Constitutional: Well developed, nourished, no distress, alert, obese habitus   Eyes: Conjunctiva normal. PERRL.    CV: S1, S2.  Reg, tachy.  No murmurs/rubs.  No thrills palpated.  No carotid bruits.  Intact distal pulses.  No edema.   Pulm: No abnormalities on inspection.  Clear to auscultation bilaterally. No wheezing/rhonchi.  Normal effort.     Neuro: A/O x 3. No focal motor or sensory deficits. Speech normal.   Skin: Several excoriations and erythematous papules on bilat arms and  chest.  Worst is in L antecubital fossa with open tunnel visible.  No  discharge.     Psych: paranoid affect  Short-term memory intact.

## 2014-11-15 NOTE — Progress Notes (Signed)
Bridget SparrowJanet Mize Heminger is a 59 y.o. female here today with complaints of skin irration.            1. Have you been to the ER, urgent care clinic since your last visit?  Hospitalized since your last visit?Yes [facility]    2. Have you seen or consulted any other health care providers outside of the [facility] since your last visit?  Include any pap smears or colon screening. Yes, Pain management

## 2014-11-16 ENCOUNTER — Encounter: Attending: Physician Assistant | Primary: Internal Medicine

## 2014-11-16 ENCOUNTER — Telehealth

## 2014-11-16 LAB — METABOLIC PANEL, COMPREHENSIVE
A-G Ratio: 1.3 (ref 0.8–1.7)
ALT (SGPT): 21 U/L (ref 13–56)
AST (SGOT): 26 U/L (ref 15–37)
Albumin: 4.1 g/dL (ref 3.4–5.0)
Alk. phosphatase: 137 U/L — ABNORMAL HIGH (ref 45–117)
Anion gap: 12 mmol/L (ref 3.0–18)
BUN/Creatinine ratio: 13 (ref 12–20)
BUN: 12 MG/DL (ref 7.0–18)
Bilirubin, total: 0.4 MG/DL (ref 0.2–1.0)
CO2: 27 mmol/L (ref 21–32)
Calcium: 8.5 MG/DL (ref 8.5–10.1)
Chloride: 103 mmol/L (ref 100–108)
Creatinine: 0.91 MG/DL (ref 0.6–1.3)
GFR est AA: >60 mL/min/{1.73_m2} (ref >60)
GFR est non-AA: >60 mL/min/{1.73_m2} (ref >60)
Globulin: 3.1 g/dL (ref 2.0–4.0)
Glucose: 76 mg/dL (ref 74–99)
Potassium: 4 mmol/L (ref 3.5–5.5)
Protein, total: 7.2 g/dL (ref 6.4–8.2)
Sodium: 142 mmol/L (ref 136–145)

## 2014-11-16 LAB — CRP, HIGH SENSITIVITY: C-Reactive Protein, Cardiac: 9.16 mg/L — ABNORMAL HIGH (ref 0.00–3.00)

## 2014-11-16 NOTE — Addendum Note (Signed)
Addended byDaine Gravel on: 11/16/2014 12:15 PM      Modules accepted: Orders

## 2014-11-16 NOTE — Telephone Encounter (Signed)
Spoke with patient and husband.  He thinks her arm wound has gotten worse.  He states she is now agreeable to go to [facility] for admission to psych ward.  He also thinks the wound needs to be re-evaluated.  She is under the care of [facility] (Dr. Everlean Cherry) but doesn't have the appointment until 7/25.  I agree with the plan to take pt to [facility] for voluntary admission to psych ward and for wound care/evaluation.

## 2014-11-16 NOTE — Telephone Encounter (Signed)
Patient's husband called. He would like to speak with Dr.Szulc. She went back to the hospital last night. Please [phone].

## 2014-11-16 NOTE — Telephone Encounter (Signed)
I spoke with husband who states the patient inflicted another injury into her arm and went to the hospital yesterday.  They packed the wound.  The husband is concerned for her health/wellbeing.  He believes she is psychotic and having hallucinations/delusions.  I agree with this diagnosis and I have advised him to call one of the psych groups ASAP and get her psychiatric help.  Alternatively, he can take her to [facility] and have her TDO to psych ward for her safety.  I reviewed that labs were normal, normal eos and no leukocytosis.    [facility] psychotherapy   [address] [phone]   [phone]    [facility]  [address]   [place], [place] [address]   [phone]

## 2014-11-16 NOTE — Telephone Encounter (Signed)
Please call lab and add cmp

## 2014-11-16 NOTE — Telephone Encounter (Signed)
Order faxed to lab.

## 2014-11-17 DIAGNOSIS — T50905A Adverse effect of unspecified drugs, medicaments and biological substances, initial encounter: Secondary | ICD-10-CM | POA: Insufficient documentation

## 2014-11-17 DIAGNOSIS — F19921 Other psychoactive substance use, unspecified with intoxication with delirium: Secondary | ICD-10-CM

## 2014-11-23 ENCOUNTER — Ambulatory Visit
Admit: 2014-11-23 | Discharge: 2014-11-23 | Payer: PRIVATE HEALTH INSURANCE | Attending: Physician Assistant | Primary: Internal Medicine

## 2014-11-23 ENCOUNTER — Ambulatory Visit: Attending: Physician Assistant | Primary: Internal Medicine

## 2014-11-23 DIAGNOSIS — G894 Chronic pain syndrome: Secondary | ICD-10-CM

## 2014-11-23 NOTE — Patient Instructions (Addendum)
1. We must get a letter from your psychiatrist clearing you to continue with opioid medication for pain management (The letter must state that you are stable, no harm to yourself or other and that you are cleared to resume pain management.  2.  Once this office receive this letter, it will be reviewed and determined whether we can resume medication for pain  3.  The behavioral health center has taken you off of your opioids at this time and weaned you down on tramadol.  You should not experience any withdrawal symptoms but if you do you may call the office and we will call a withdrawal regimen into your pharmacy.   4.  OPana er is discontinued   5.  Do not take the dilaudid (may put on hold until you are seen again-do not believe this was part of your reaction)  You are in possession of medication that is no longer part of your active treatment regimen. We strongly advise that you properly dispose of it as quickly as possible to help reduce the chance that others may accidentally take or intentionally misuse the discontinued medication.     Please understand that ongoing use or distribution of a discontinued medication containing a controlled substance is a violation of your signed Controlled Substance Consent & Treatment Agreement and will result in dismissal from our practice.      Listed below are some options and special instructions to consider when disposing of discontinued, expired, unwanted, or unused medications:    ?? Drug Encorcement Agency (DEA) authorized collectors cansafely and securely collect and dispose of pharmaceuticals containing controlled substances and other medications. Authorized collection sites may be [employer]ail pharmacies, hospital or clinic pharmacies, and Patent examinerlaw enforcement locations.     For DEA-authorized collectors near you, contact the DEA Office of Diversion Control's Registration Call Center at [phone] or visit the following web address:  https://[email or web address]-[email or web address]/usdoj.gov/pubdispsearch/spring/main?execution=e1s1.      DEA-authorized collectors within the local [facility] include:  [facility]  [address]  [place], [place] [phone]    [facility]  [address]  [place], [place] [phone]    [facility]  [address]  [place], [place] [phone]    [facility]  [address]  [place], [place] [phone]    ?? Medicine take-back programs are another good way to safely dispose of most types of discontinued medications. The DEA periodically hosts National Prescription Drug Take-Back events where collection sites are set up in communities nationwide for safe disposal of prescription drugs. Local law enforcement agencies may also sponsor medication take-back programs.    ?? If unable to reach a medication take-back program or DEA-authorized collector, you can also follow these simple steps to dispose of medications in the household trash:  1. Mix medications (do not crush tablets or capsules) with an unappetizing substance such as dirt, kitty litter, or used coffee grounds;  2. Place the mixture in a container such as a sealed plastic bag;  3. Throw the container in your household trash;  4. Scratch out all personal information on the prescription label of your empty pill bottle or empty medication packaging to make it unreadable, then dispose of the container.

## 2014-11-23 NOTE — Progress Notes (Signed)
Nursing Notes    Patient presents to the office today in follow-up.   Patient rates her pain at 7/10 on the numerical pain scale.     Reviewed medications with counts as follows:    Rx Date filled Qty Dispensed Pill Count Last Dose Short   opana ER 15 mg - pt stopped medication because of delirium 11/11/14 60 48 11/16/14 no   Dilaudid 2 mg - pt stopped medication because of delirium 11/11/14 120 105 11/16/14 no                         POC UDS was not performed in office today.    Any new labs or imaging since last appointment? YES. Pt states that she had some labs and a chest x-ray done while she was in the ER.    Have you been to an emergency room (ER) or urgent care clinic since your last visit?  YES. Pt went to the ER for delirium              Have you been hospitalized since your last visit? YES     If yes, where, when, and reason for visit? Pt states that she admitted herself in to the psych center at [facility] for 3 days for medication induced delirium.     Have you seen or consulted any other health care providers outside of the [facility]  since your last visit?  YES     If yes, where, when, and reason for visit? Dermatology and pcp    HM deferred to pcp.

## 2014-11-23 NOTE — Progress Notes (Signed)
HISTORY OF PRESENT ILLNESS  Bridget Morton is a 59 y.o. female.  HPI  The patient presents today for follow up of chronic  severe pain involving the cervical region with radiation to both upper extremity as well as the lumbar spine with radiation to both lower extremities. She is s/p lumbar fusion/ lumbar post-laminectomy syndrome.  This is my first visit with Bridget Morton.  She rescheduled her appointment last week due to being late for her appointment here.      At her last visit, she was changed from MSER to Opana.     She reports at some point after starting the opana ER a rash started on her left arm.  She was seen by dermatology.  She found something on the internet about other patient's finding bugs and fibers coming out of their skin.  She reports that she got it in her mind that she had this.  She then states that she started to see "white worms".  She reports that she then "started to feel that my hair was coming after me in the shower".  She reports that it started to spread (arms and chest).  She then started pulling "stuff out with tweezers".  She has never had anything like this happen before.  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  She reports checking herself into [facility] (7/22-25).  She reports they took her off the opana er and dilaudid and she reports the symptoms resolved after a day.  She reports the symptoms of delirium resolved.  She denies any suicidal/homicidal ideation.  They did start her on tramadol.      She has a follow up with her psychiatrist next week.      Bridget Morton and I discussed at length the protocol regarding psychiatric admissions and clearance from psychiatry to continue pain management and opioid medication to manage pain.     She was given tramadol (a weaning schedule for this medication) and d/c'ed the opana er and dilaudid while she was admitted.  She has been off of her medications for a week and denies any significant withdrawal symptoms.  She was advised that if she does experience any symptoms of withdrawal to call this office and we can send in withdrawal cocktail to her pharmacy (pertaining to her symptoms).      She made a recommendation that psychiatric admission and protocol for resuming opioid management for pain be added to the agreement patients sign with this office.      She describes her pain as aching, stabbing, stiff, and tender. Symptoms will often radiate down the neck into the arms/hands.    [name] was advised to discontinue opana er and dilaudid. She was provided education on proper medication disposal options as indicated by the FDA/DEA. She was also advised that failing to properly dispose of medications that are no longer part of her regimen would be considered non-compliance with the treatment plan.    UDS 11/10/14 reviewed and appears consistent.        Review of Systems   Constitutional: Positive for weight loss and malaise/fatigue. Negative for fever and chills.   HENT: Negative for congestion and sore throat.    Eyes: Positive for blurred vision and double vision.        Wears glasses  Respiratory: Negative for cough and shortness of breath.    Cardiovascular: Positive for leg swelling. Negative for chest pain.   Gastrointestinal: Positive for nausea, diarrhea and constipation. Negative for heartburn and vomiting.   Genitourinary: Negative.    Musculoskeletal: Positive for myalgias, back pain, joint pain, falls (no ED visit) and neck pain.   Skin: Positive for itching and rash.        See above   Neurological: Positive for weakness and headaches (advised to follow up with pcp). Negative for dizziness, tingling, tremors, sensory change and seizures.    Endo/Heme/Allergies: Positive for environmental allergies. Does not bruise/bleed easily.   Psychiatric/Behavioral: Positive for depression and hallucinations (see above/delirium). Negative for suicidal ideas. The patient is nervous/anxious and has insomnia (trazodone).         Sees psychiatry regularly (Dr. Hildred Priest  Prescribed wellbutrin and effexor       Physical Exam   Constitutional: She is oriented to person, place, and time. She appears well-developed and well-nourished. No distress.   obese   HENT:   Head: Normocephalic.   Eyes: Conjunctivae and EOM are normal. Pupils are equal, round, and reactive to light.   glasses   Neck: Normal range of motion.   Painful ROM   Pulmonary/Chest: Effort normal. No respiratory distress.   Musculoskeletal: She exhibits tenderness (neck/lumbar). She exhibits no edema.        Cervical back: She exhibits decreased range of motion, tenderness, pain and spasm.        Lumbar back: She exhibits decreased range of motion, tenderness, pain and spasm.   Neurological: She is alert and oriented to person, place, and time. Gait (antalgic) abnormal.   Skin:   Skin, excoriation to arms and chest   Psychiatric: She has a normal mood and affect. Her behavior is normal. Judgment and thought content normal.   Nursing note and vitals reviewed.      ASSESSMENT and PLAN  Encounter Diagnoses   Name Primary?   ??? Chronic pain syndrome Yes   ??? Delusions of parasitosis    ??? Cervical radiculopathy    ??? Lumbar radiculopathy    ??? Non-restorative sleep    ??? Primary osteoarthritis involving multiple joints    ??? Lumbar post-laminectomy syndrome    ??? H/O gastric bypass    ??? Depression with anxiety    ??? Fibromyalgia    ??? Medication reaction, subsequent encounter      No orders of the defined types were placed in this encounter.      Patient Instructions   1. We must get a letter from your psychiatrist clearing you to continue with opioid medication for pain management (The letter must state that you  are stable, no harm to yourself or other and that you are cleared to resume pain management.  2.  Once this office receive this letter, it will be reviewed and determined whether we can resume medication for pain  3.  The behavioral health center has taken you off of your opioids at this time and weaned you down on tramadol.  You should not experience any withdrawal symptoms but if you do you may call the office and we will call a withdrawal regimen into your pharmacy.   4.  OPana er is discontinued   5.  Do not take the dilaudid (may put on hold until you are seen again-do not believe this was part of your reaction)  You are in possession of medication that is no longer part of  your active treatment regimen. We strongly advise that you properly dispose of it as quickly as possible to help reduce the chance that others may accidentally take or intentionally misuse the discontinued medication.     Please understand that ongoing use or distribution of a discontinued medication containing a controlled substance is a violation of your signed Controlled Substance Consent & Treatment Agreement and will result in dismissal from our practice.      Listed below are some options and special instructions to consider when disposing of discontinued, expired, unwanted, or unused medications:    ?? Drug Encorcement Agency (DEA) authorized collectors cansafely and securely collect and dispose of pharmaceuticals containing controlled substances and other medications. Authorized collection sites may be [employer], hospital or clinic pharmacies, and Patent examiner locations.     For DEA-authorized collectors near you, contact the DEA Office of Diversion Control's Registration Call Center at [phone] or visit the following web address: [email or web address]      DEA-authorized collectors within the local [facility] include:   [facility]  [address]  [place], [place] [address]    [facility]  [address]  [place], [place] [address]    [facility]  [address]  [place], [place] [address]    [facility]  [address]  [place], [place] [address]    ?? Medicine take-back programs are another good way to safely dispose of most types of discontinued medications. The DEA periodically hosts National Prescription Drug Take-Back events where collection sites are set up in communities nationwide for safe disposal of prescription drugs. Local law enforcement agencies may also sponsor medication take-back programs.    ?? If unable to reach a medication take-back program or DEA-authorized collector, you can also follow these simple steps to dispose of medications in the household trash:  1. Mix medications (do not crush tablets or capsules) with an unappetizing substance such as dirt, kitty litter, or used coffee grounds;  2. Place the mixture in a container such as a sealed plastic bag;  3. Throw the container in your household trash;  4. Scratch out all personal information on the prescription label of your empty pill bottle or empty medication packaging to make it unreadable, then dispose of the container.          Treatment plan discussed with Dr. Maggie Font  40 minutes spent in visit face to face with the patient.    >50% of time spent counseling and coordinating care

## 2014-11-27 ENCOUNTER — Ambulatory Visit
Admit: 2014-11-27 | Discharge: 2014-11-27 | Payer: PRIVATE HEALTH INSURANCE | Attending: Internal Medicine | Primary: Internal Medicine

## 2014-11-27 DIAGNOSIS — S41102A Unspecified open wound of left upper arm, initial encounter: Secondary | ICD-10-CM

## 2014-11-27 MED ORDER — FLUCONAZOLE 150 MG TAB
150 mg | ORAL_TABLET | Freq: Every day | ORAL | Status: AC
Start: 2014-11-27 — End: 2014-11-28

## 2014-11-27 NOTE — Progress Notes (Signed)
Assessment/Plan:    1. Wound, open, arm, upper, left, initial encounter  - REFERRAL TO WOUND CARE    2. Delusions of parasitosis  -resolved    3. Medication reaction, initial encounter  -opana caused #2    4. Elevated TSH  - TSH 3RD GENERATION; Future    The plan was discussed with the patient.  The patient verbalized understanding and is in agreement with the plan.  All medication potential side effects were discussed with the patient.    Health Maintenance:   Health Maintenance   Topic Date Due   ??? Hepatitis C Screening  07/24/1955   ??? DTaP/Tdap/Td series (1 - Tdap) 10/14/1977   ??? INFLUENZA AGE 87 TO ADULT  11/27/2014   ??? BREAST CANCER SCRN MAMMOGRAM  08/20/2015   ??? COLONOSCOPY  07/14/2018       Bridget Morton is a 59 y.o. female and presents with Hospital Follow Up     Subjective:  The patient was recently admitted to psych for delusional parasitosis with self-harm/causing wounds.  It was ultimately determined to be from her pain meds (opana).  She was weaned off her meds in the hospital with resolution of this issue.  She saw pain management ([facility]), who stated they couldn't treat her until she got clearance from psychiatry.  Has f/u with psych tomorrow (Dr. Deretha Emory).    Pt is requesting diflucan for yeast infection.  Has vaginal itching from recent antibiotics    ROS:  Constitutional: No recent weight change. No weakness/fatigue.  No f/c.   Cardiovascular: No CP/palpitations.  No DOE/orthopnea/PND.   Respiratory: No cough/sputum, dyspnea, wheezing.   Gastointestinal: No dysphagia, reflux.  No n/v.  No constipation/diarrhea.  No melena/rectal bleeding.   Neurological: No seizures/numbness/weakness.  No paresthesias.   Psychiatric:  +depression, anxiety.     The problem list was updated as a part of today's visit.  Patient Active Problem List   Diagnosis Code   ??? Internal hemorrhoid K64.8   ??? OA (osteoarthritis) M19.90   ??? Edema R60.9   ??? Adiposis dolorosa E88.2   ??? Chronic pain G89.29    ??? Iron deficiency E61.1   ??? Fibromyalgia M79.7   ??? Insomnia G47.00   ??? Daytime somnolence O84.1   ??? Diastolic dysfunction Y60.6   ??? Chronic constipation K59.09   ??? Dry eyes, bilateral H04.123   ??? Leg cramps R25.2   ??? Dysphagia, oropharyngeal phase R13.12   ??? Depression F32.9   ??? H/O gastric bypass Z98.89   ??? Primary osteoarthritis involving multiple joints M15.0   ??? Lumbar post-laminectomy syndrome M96.1   ??? Cervical radiculopathy M54.12   ??? Lumbar radiculopathy M54.16   ??? Non-restorative sleep G47.8   ??? Fatigue R53.83   ??? Delusions of parasitosis F68.11       The PSH, FH were reviewed.    SH:  History   Substance Use Topics   ??? Smoking status: Former Smoker -- 10 years     Types: Cigarettes     Quit date: 12/16/2010   ??? Smokeless tobacco: Never Used   ??? Alcohol Use: Yes      Comment: seldom       Medications/Allergies:  Current Outpatient Prescriptions on File Prior to Visit   Medication Sig Dispense Refill   ??? HYDROmorphone (DILAUDID) 2 mg tablet Take 1 Tab by mouth four (4) times daily as needed for Pain for up to 30 days. Max Daily Amount: 8 mg. Indications: PAIN 120 Tab  0   ??? traZODone (DESYREL) 100 mg tablet TAKE 1 TABLET BY MOUTH EVERY NIGHT AT BEDTIME 90 Tab 0   ??? furosemide (LASIX) 20 mg tablet TAKE 1 TABLET BY MOUTH TWICE DAILY. GENERIC FOR LASIX. 180 Tab 0   ??? esomeprazole (NEXIUM) 40 mg capsule TAKE 1 CAPSULE BY MOUTH EVERY DAY 90 Cap 1   ??? tolterodine ER (DETROL LA) 4 mg ER capsule TAKE 1 CAPSULE BY MOUTH DAILY 90 Cap 1   ??? LYRICA 100 mg capsule TAKE ONE CAPSULE BY MOUTH EVERY MORNING AND 2 CAPSULES BY MOUTH EVERY EVENING 90 Cap 0   ??? potassium chloride (KAON 10%) 20 mEq/15 mL solution Take 5 mL by mouth daily. 480 mL 0   ??? BUPROPION HCL (WELLBUTRIN PO) Take  by mouth daily.     ??? venlafaxine (EFFEXOR) 75 mg tablet TAKE 1 TABLET BY MOUTH TWICE DAILY 60 Tab 6   ??? fluticasone (FLONASE) 50 mcg/actuation nasal spray SPRAY 2 SPRAYS IN BOTH NOSTRILS DAILY 3 Bottle 3    ??? linaclotide (LINZESS) 145 mcg cap capsule Take  by mouth daily as needed.     ??? celecoxib (CELEBREX) 200 mg capsule Take 200 mg by mouth daily. 60 Cap 2   ??? montelukast (SINGULAIR) 10 mg tablet Take 10 mg by mouth daily.     ??? multivitamin (ONE A DAY) tablet Take 1 Tab by mouth daily.     ??? calcium citrate-vitamin D3 (CITRACAL WITH VITAMIN D MAXIMUM) tablet Take 2 Tabs by mouth two (2) times a day.       No current facility-administered medications on file prior to visit.        Allergies   Allergen Reactions   ??? Opana Er [Oxymorphone] Other (comments)     delirium       Objective:  BP 120/88 mmHg   Pulse 110   Temp(Src) 98.7 ??F (37.1 ??C) (Oral)   Resp 16   Ht 5\' 3"  (1.6 m)   Wt 225 lb (102.059 kg)   BMI 39.87 kg/m2   SpO2 98%   Constitutional: Well developed, nourished, no distress, alert, obese habitus   CV: S1, S2.  Reg, tachy.  No murmurs/rubs.  No thrills palpated.  No carotid bruits.  Intact distal pulses.  No edema.   Pulm: No abnormalities on inspection.  Clear to auscultation bilaterally. No wheezing/rhonchi.  Normal effort.     Neuro: A/O x 3. No focal motor or sensory deficits. Speech normal.   Skin: Healing excoriations on bilat upper arms.  1.5cm open wound with packing on L proximal arm.    Psych: Appropriate affect, judgement and insight.  Short-term memory intact.       Labwork and Ancillary Studies:    CBC w/Diff  Lab Results   Component Value Date/Time    WBC 4.5 11/15/2014 10:14 AM    HGB 12.4 11/15/2014 10:14 AM    PLATELET 171 11/15/2014 10:14 AM         Basic Metabolic Profile/LFTs  Lab Results   Component Value Date/Time    SODIUM 142 11/15/2014 10:14 AM    POTASSIUM 4.0 11/15/2014 10:14 AM    CHLORIDE 103 11/15/2014 10:14 AM    CO2 27 11/15/2014 10:14 AM    ANION GAP 12 11/15/2014 10:14 AM    GLUCOSE 76 11/15/2014 10:14 AM    BUN 12 11/15/2014 10:14 AM    CREATININE 0.91 11/15/2014 10:14 AM    BUN/CREATININE RATIO 13 11/15/2014 10:14 AM  GFR EST AA >60 11/15/2014 10:14 AM     GFR EST NON-AA >60 11/15/2014 10:14 AM    CALCIUM 8.5 11/15/2014 10:14 AM      Lab Results   Component Value Date/Time    ALT 21 11/15/2014 10:14 AM    AST 26 11/15/2014 10:14 AM    ALK. PHOSPHATASE 137 11/15/2014 10:14 AM    BILIRUBIN, TOTAL 0.4 11/15/2014 10:14 AM       Cholesterol  Lab Results   Component Value Date/Time    CHOLESTEROL, TOTAL 194 10/20/2013 02:17 PM    HDL CHOLESTEROL 83 10/20/2013 02:17 PM    LDL, CALCULATED 95 10/20/2013 02:17 PM    TRIGLYCERIDE 79 10/20/2013 02:17 PM

## 2014-11-29 NOTE — Telephone Encounter (Signed)
Received a voice mail from this pt stating/requesting, she has the letter from her psych provider, pt asked what is needed to be done at this point. Chart was reviewed and was documented this pt will need to give a copy of the letter to [facility]. Once letter has been received, it will be reviewed and a decision will be made at that time. Pt was called back and this information was given. Pt stated her husband will fax over the letter, when he gets to work. Pt was advised to use a cover sheet for privacy.

## 2014-11-30 MED ORDER — PREGABALIN 100 MG CAP
100 mg | ORAL_CAPSULE | ORAL | Status: DC
Start: 2014-11-30 — End: 2016-12-22

## 2014-11-30 NOTE — Progress Notes (Signed)
Please fax lyrica to pharmacy

## 2014-12-01 ENCOUNTER — Ambulatory Visit
Admit: 2014-12-01 | Discharge: 2014-12-01 | Payer: PRIVATE HEALTH INSURANCE | Attending: Pain Medicine | Primary: Internal Medicine

## 2014-12-01 ENCOUNTER — Ambulatory Visit: Attending: Pain Medicine | Primary: Internal Medicine

## 2014-12-01 DIAGNOSIS — M961 Postlaminectomy syndrome, not elsewhere classified: Secondary | ICD-10-CM

## 2014-12-01 DIAGNOSIS — L988 Other specified disorders of the skin and subcutaneous tissue: Secondary | ICD-10-CM | POA: Insufficient documentation

## 2014-12-01 MED ORDER — TAPENTADOL 50 MG TAB
50 mg | ORAL_TABLET | Freq: Four times a day (QID) | ORAL | Status: DC | PRN
Start: 2014-12-01 — End: 2015-01-31

## 2014-12-01 NOTE — Progress Notes (Signed)
Nursing Notes    Patient presents to the office today in follow-up.   Patient rates her pain at 8/10 on the numerical pain scale.     Reviewed medications with counts as follows:    Rx Date filled Qty Dispensed Pill Count Last Dose Short   Pt is not currently on any medication. First visit back after psych clearance                                       POC UDS was not performed in office today    Any new labs or imaging since last appointment? NO    Have you been to an emergency room (ER) or urgent care clinic since your last visit?  NO            Have you been hospitalized since your last visit? NO     If yes, where, when, and reason for visit?     Have you seen or consulted any other health care providers outside of the [facility]  since your last visit?  YES     If yes, where, when, and reason for visit? Psych    HM deferred to pcp.

## 2014-12-01 NOTE — Progress Notes (Signed)
HISTORY OF PRESENT ILLNESS  Bridget Morton is a 59 y.o. female.  HPI She returns for followup of chronic, severe pain which is widespread and multifactorial and includes generalized osteoarthritis as well as cervical degenerative disc disease and spondylosis, lumbar disc disease and spondylosis and a lumbar post laminectomy syndrome.    Since last seen, she had been hospitalized as noted secondary to delusional parasitosis which was felt to be secondary to her oxymorphone. She was taken off of her medications and place on tramadol which she did not find overly effective for her pain control.    When questioned today, she does not recall how her arms became so excoriated and continues to report "white threads" seems to, pop out of the lesions. She believes that this is her body is a manifestation of healing.    She is under psychiatric care actively.    Pain level today 8/10, outcome 13/28,(The lower the upper number, the better the outcome)  Physical activity mobility remains curtailed her mood and sleep are good.  No reported side effects currently.    A review of her medication history was performed and it was elected to initiate nucynta, 50 mg, 4 times daily as needed for her chronic pain.  She will be reassessed in 6 weeks' time and is instructed to contact our office should she experience any significant side effects or alterations in her behavior.    Review of Systems   Constitutional: Positive for malaise/fatigue.   Gastrointestinal: Positive for heartburn, nausea and constipation.   Musculoskeletal: Positive for myalgias, back pain, joint pain and neck pain.   Neurological: Positive for weakness (generalized).   Psychiatric/Behavioral: The patient has insomnia.    All other systems reviewed and are negative.      Physical Exam   Constitutional: She is oriented to person, place, and time. She appears distressed.   HENT:   Head: Normocephalic and atraumatic.   Right Ear: External ear normal.    Left Ear: External ear normal.   Nose: Nose normal.   Mouth/Throat: Oropharynx is clear and moist. No oropharyngeal exudate.   Eyes: Conjunctivae and EOM are normal. Pupils are equal, round, and reactive to light. Right eye exhibits no discharge. Left eye exhibits no discharge. No scleral icterus.   Neck: Spinous process tenderness and muscular tenderness present. Decreased range of motion present. No thyromegaly present.   Cardiovascular: Normal rate, regular rhythm and normal heart sounds.    Pulmonary/Chest: Effort normal and breath sounds normal. No respiratory distress. She has no wheezes. She has no rales.   Abdominal: Soft. She exhibits no distension. There is no tenderness. There is no rebound and no guarding.   Musculoskeletal: She exhibits tenderness.        Right shoulder: She exhibits decreased range of motion, tenderness and bony tenderness.        Left shoulder: She exhibits decreased range of motion, tenderness and bony tenderness.        Right elbow: She exhibits decreased range of motion. Tenderness found.        Left elbow: She exhibits decreased range of motion. Tenderness found.        Right wrist: She exhibits decreased range of motion, tenderness and bony tenderness.        Left wrist: She exhibits decreased range of motion, tenderness and bony tenderness.        Lumbar back: She exhibits decreased range of motion, tenderness, pain and spasm.   Neurological: She is alert and  oriented to person, place, and time. She has normal reflexes. No cranial nerve deficit or sensory deficit. She exhibits normal muscle tone. Gait abnormal. Coordination normal.   Reflex Scores:       Tricep reflexes are 2+ on the right side and 2+ on the left side.       Bicep reflexes are 2+ on the right side and 2+ on the left side.       Brachioradialis reflexes are 2+ on the right side and 2+ on the left side.       Patellar reflexes are 2+ on the right side and 2+ on the left side.        Achilles reflexes are 2+ on the right side and 2+ on the left side.  Skin: Skin is warm. No rash noted.   Psychiatric: She has a normal mood and affect. Her behavior is normal. Judgment and thought content normal.   Nursing note and vitals reviewed.      ASSESSMENT and PLAN  Encounter Diagnoses   Name Primary?   ??? Lumbar post-laminectomy syndrome Yes   ??? Cervical radiculopathy    ??? Lumbar radiculopathy    ??? Non-restorative sleep    ??? Primary osteoarthritis involving multiple joints    ??? Chronic constipation    ??? Chronic pain syndrome    ??? Encounter for long-term (current) use of high-risk medication    ??? Morgellons disease      Treatment plan as noted above.  6 week reassessment    No concerns are raised for misuse, abuse, or diversion.    1. Pain medications are prescribed with the objective of pain relief and improved physical and psychosocial function in this patient.  2. Counseled patient on proper use of prescribed medications and reviewed opioid contract.  3. Counseled patient about chronic medical conditions and their relationship to anxiety and depression and recommended mental health support as needed.  4. Reviewed with patient self-help tools, home exercise, and lifestyle changes to assist the patient in self-management of symptoms.  5. Advised patient to have a primary care provider to continue care for health maintenance and general medical conditions and support for referral to specialty care as needed.  6. Reviewed with patient the treatment plan, goals of treatment plan, and limitations of treatment plan, to include the potential for side effects from medications and procedures. If side effects occur, it is the responsibility of the patient to inform the clinic so that a change in the treatment plan can be made in a safe manner. The patient is advised that stopping prescribed medication may cause an increase in symptoms and possible medication withdrawal symptoms. The patient is informed an  emergency room evaluation may be necessary if this occurs.  DISPOSITION: The patient???s condition and plan were discussed at length and all questions were answered. The patient agrees with the plan.    Counseling occupied > 50% of visit:  Total time: 40 minutes

## 2014-12-01 NOTE — Patient Instructions (Signed)
Current health maintenance issues were reviewed and the patient was advised to followup with his/her PCP for completion of these items.

## 2014-12-25 MED ORDER — PANTOPRAZOLE 40 MG TAB, DELAYED RELEASE
40 mg | ORAL_TABLET | Freq: Every day | ORAL | 2 refills | Status: DC
Start: 2014-12-25 — End: 2015-09-16

## 2015-01-11 ENCOUNTER — Encounter: Attending: Physician Assistant | Primary: Internal Medicine

## 2015-01-18 MED ORDER — TRAZODONE 100 MG TAB
100 mg | ORAL_TABLET | ORAL | 1 refills | Status: DC
Start: 2015-01-18 — End: 2016-04-09

## 2015-01-31 ENCOUNTER — Ambulatory Visit
Admit: 2015-01-31 | Discharge: 2015-01-31 | Payer: PRIVATE HEALTH INSURANCE | Attending: Physician Assistant | Primary: Internal Medicine

## 2015-01-31 ENCOUNTER — Ambulatory Visit: Attending: Physician Assistant | Primary: Internal Medicine

## 2015-01-31 DIAGNOSIS — M797 Fibromyalgia: Secondary | ICD-10-CM

## 2015-01-31 MED ORDER — TAPENTADOL 50 MG TAB
50 mg | ORAL_TABLET | Freq: Four times a day (QID) | ORAL | 0 refills | Status: DC | PRN
Start: 2015-01-31 — End: 2015-03-28

## 2015-01-31 NOTE — Progress Notes (Signed)
HISTORY OF PRESENT ILLNESS  Bridget Morton is a 59 y.o. female    HPI: Bridget Morton  returns today for f/u of chronic, severe widespread and multifactorial pain including generalized osteoarthritis, spondylosis and a lumbar post laminectomy syndrome.    Today is my first visit with Bridget Morton. She continues unchanged since last visit.  We discussed her current condition and medications in detail.  She was recently started on Nucynta 50 mg up to 4 times daily as needed.  She is doing well with the medication with no side effects.  She has recently moved to [facility].  She has been staying with her daughter who has been seeking out new providers for her.  She has been having trouble finding a pain provider in her current area.  She is planning to just drive back for her visits for now but may be transferring her care in the future.  She is otherwise doing well with no new complaints today.    Current medication management consists of Nucynta 50 mg 4 times a day when necessary.  Medications are helping with pain control and quality of life. Her pain is 4/10 with medication and 10/10 without.  Pt describes pain as aching and stabbing. Aggravating factors include standing, bending, and walking. Relieved with rest, medication, and avoiding painful activities. Current treatment is helping to improve general activity, mood, walking, sleep, enjoyment of life    In the past 30 days, the patient reports approximately 60% pain relief with current treatment/medications.    She  is otherwise doing well with no other complaints today. She denies any adverse events including nausea, vomiting, dizziness, increased constipation, hallucinations, or seizures.     Medications were brought to visit today. Pill counts were appropriate       Allergies   Allergen Reactions   ??? Opana Er [Oxymorphone] Other (comments)     delirium       Past Surgical History   Procedure Laterality Date   ??? Hx partial hysterectomy  2000      supracervical   ??? Hx tonsil and adenoidectomy       childhood   ??? Hx orthopaedic       L4-L5 fusion   ??? Hx knee replacement       bilateral   ??? Hx rotator cuff repair       x 3 on right   ??? Hx appendectomy     ??? Hx gastric bypass  2004   ??? Hx gyn     ??? Hx cesarean section  1978, 1982         Review of Systems   Constitutional: Positive for malaise/fatigue. Negative for chills and fever.   HENT: Negative for congestion and sore throat.    Respiratory: Negative for cough, shortness of breath and wheezing.    Gastrointestinal: Positive for diarrhea. Negative for constipation, heartburn, nausea and vomiting.   Musculoskeletal: Positive for back pain, joint pain, myalgias and neck pain.   Neurological: Positive for weakness and headaches. Negative for dizziness, seizures and loss of consciousness.   Psychiatric/Behavioral: Negative for depression. The patient is not nervous/anxious and does not have insomnia.    All other systems reviewed and are negative.      Physical Exam   Constitutional: She is oriented to person, place, and time and well-developed, well-nourished, and in no distress. No distress.   HENT:   Head: Normocephalic and atraumatic.   Eyes: EOM are normal.   Neck: Normal  range of motion.   Pulmonary/Chest: Effort normal.   Musculoskeletal:        Lumbar back: She exhibits decreased range of motion, tenderness and pain.   Neurological: She is alert and oriented to person, place, and time.   Skin: Skin is warm and dry. No rash noted. She is not diaphoretic. No erythema.   Psychiatric: Mood, memory, affect and judgment normal.   Nursing note and vitals reviewed.      ASSESSMENT:    1. Fibromyalgia    2. Chronic pain syndrome    3. Primary osteoarthritis involving multiple joints    4. Lumbar radiculopathy    5. Cervical radiculopathy    6. Lumbar post-laminectomy syndrome          PLAN / Pt Instructions:  1. Continue current plan with no evidence of addiction or diversion.  Stable on current medication without adverse events.    2. Refill Nucynta 50 mg up to 4 times daily as needed.  3. Continue Lyrica provided by another provider.  4. Discussed risks of addiction, dependency, and opioid induced hyperalgesia.   5. Return to clinic in 2 months    Medications Ordered Today   Medications   ??? tapentadol (NUCYNTA) 50 mg tablet     Sig: Take 50 mg by mouth four (4) times daily as needed for Pain for up to 30 days. Max Daily Amount: 200 mg. Indications: PAIN     Dispense:  120 Tab     Refill:  0   ??? tapentadol (NUCYNTA) 50 mg tablet     Sig: Take 50 mg by mouth four (4) times daily as needed for Pain for up to 30 days. Max Daily Amount: 200 mg. Indications: PAIN     Dispense:  120 Tab     Refill:  0       Pain medications prescribed with the objective of pain relief and improved physical and psychosocial function in this patient.    Spent 25 minutes with patient today reviewing the treatment plan, goals of treatment plan, and limitations of the treatment plan, to include the potential for side effects from medications and procedures.        Bridget Morton, [place] 01/31/2015      Note: Please excuse any typographical errors. Voice recognition software was used for this note and may cause mistakes.

## 2015-01-31 NOTE — Progress Notes (Signed)
Nursing Notes    Patient presents to the office today in follow-up.   Patient rates her pain at 5/10 on the numerical pain scale.     Reviewed medications with counts as follows:    Rx Date filled Qty Dispensed Pill Count Last Dose Short   nucynta  01/03/15 120 8 This am  No                                           Comments:     POC UDS was not performed in office today    Any new labs or imaging since last appointment? YES; x-rays of left , wrist, and right knee    Have you been to an emergency room (ER) or urgent care clinic since your last visit?  YES ; [facility] for a fall            Have you been hospitalized since your last visit? NO     If yes, where, when, and reason for visit?     Have you seen or consulted any other health care providers outside of the [facility]  since your last visit?  YES     If yes, where, when, and reason for visit? Emergency dept physicians, psychiatrist, orthopedist, dermatologist, rheumatologist         HM deferred to pcp.

## 2015-01-31 NOTE — Patient Instructions (Signed)
1. Continue current plan with no evidence of addiction or diversion. Stable on current medication without adverse events.    2. Refill Nucynta 50 mg up to 4 times daily as needed.  3. Continue Lyrica provided by another provider.  4. Discussed risks of addiction, dependency, and opioid induced hyperalgesia.   5. Return to clinic in 2 months

## 2015-02-23 MED ORDER — TOLTERODINE SR 4 MG 24 HR CAP
4 mg | ORAL_CAPSULE | ORAL | 0 refills | Status: DC
Start: 2015-02-23 — End: 2016-12-22

## 2015-03-05 DIAGNOSIS — M5417 Radiculopathy, lumbosacral region: Secondary | ICD-10-CM | POA: Insufficient documentation

## 2015-03-28 ENCOUNTER — Ambulatory Visit
Admit: 2015-03-28 | Discharge: 2015-03-28 | Payer: PRIVATE HEALTH INSURANCE | Attending: Physical Medicine & Rehabilitation | Primary: Internal Medicine

## 2015-03-28 ENCOUNTER — Ambulatory Visit: Attending: Physical Medicine & Rehabilitation | Primary: Internal Medicine

## 2015-03-28 DIAGNOSIS — M545 Low back pain, unspecified: Secondary | ICD-10-CM

## 2015-03-28 DIAGNOSIS — G8929 Other chronic pain: Secondary | ICD-10-CM

## 2015-03-28 MED ORDER — TAPENTADOL 50 MG TAB
50 mg | ORAL_TABLET | Freq: Four times a day (QID) | ORAL | 0 refills | Status: AC | PRN
Start: 2015-03-28 — End: 2015-04-27

## 2015-03-28 MED ORDER — TAPENTADOL 50 MG TAB
50 mg | ORAL_TABLET | Freq: Four times a day (QID) | ORAL | 0 refills | Status: AC | PRN
Start: 2015-03-28 — End: 2015-05-26

## 2015-03-28 NOTE — Progress Notes (Signed)
HISTORY OF PRESENT ILLNESS  Bridget Morton is a 59 y.o. female.  HPI Comments: Meds help with pain control and quality of life.  No new side effects reported today.No new medical problems reported today.Visit survey reviewed, and will be scanned.Recent Average pain level (out of 10).-- 6  Chief complaint, low back pain, also neck pain  Chronic pain  Continues to use Nucynta 50 mg as needed  Celebrex, Seroquel, doxepin, Lyrica, trazodone, Wellbutrin prescribed by other clinics  She is considering moving outside of [place], closer to her daughter.  She has found a new psychiatrist and rheumatologist in that area already.  We discussed possibilities of seeing a different pain clinic as well.  I did also encourage her to participate with alternative treatments such as yoga, exercise in the water etc.             Review of Systems   Constitutional: Positive for malaise/fatigue. Negative for chills and fever.   HENT: Negative for congestion and sore throat.    Respiratory: Negative for cough, shortness of breath and wheezing.    Gastrointestinal: Positive for diarrhea. Negative for constipation, heartburn, nausea and vomiting.   Musculoskeletal: Positive for back pain, joint pain, myalgias and neck pain.   Neurological: Positive for weakness and headaches. Negative for dizziness, seizures and loss of consciousness.   Psychiatric/Behavioral: Negative for depression and suicidal ideas. The patient is not nervous/anxious and does not have insomnia.    All other systems reviewed and are negative.      Physical Exam   Constitutional: She is oriented to person, place, and time. She appears distressed.   HENT:   Head: Normocephalic and atraumatic.   Right Ear: External ear normal.   Left Ear: External ear normal.   Nose: Nose normal.   Mouth/Throat: Oropharynx is clear and moist. No oropharyngeal exudate.   Eyes: Conjunctivae and EOM are normal. Pupils are equal, round, and  reactive to light. Right eye exhibits no discharge. Left eye exhibits no discharge. No scleral icterus.   Neck: Spinous process tenderness and muscular tenderness present. Decreased range of motion present. No thyromegaly present.   Cardiovascular: Normal rate, regular rhythm and normal heart sounds.    Pulmonary/Chest: Effort normal and breath sounds normal. No respiratory distress. She has no wheezes. She has no rales.   Abdominal: Soft. She exhibits no distension. There is no tenderness. There is no rebound and no guarding.   Musculoskeletal: She exhibits tenderness.        Right shoulder: She exhibits decreased range of motion, tenderness and bony tenderness.        Left shoulder: She exhibits decreased range of motion, tenderness and bony tenderness.        Right elbow: She exhibits decreased range of motion. Tenderness found.        Left elbow: She exhibits decreased range of motion. Tenderness found.        Right wrist: She exhibits decreased range of motion, tenderness and bony tenderness.        Left wrist: She exhibits decreased range of motion, tenderness and bony tenderness.        Lumbar back: She exhibits decreased range of motion, tenderness, pain and spasm.   Neurological: She is alert and oriented to person, place, and time. She has normal reflexes. No cranial nerve deficit or sensory deficit. She exhibits normal muscle tone. Gait abnormal. Coordination normal.   Reflex Scores:       Tricep reflexes are 2+ on the  right side and 2+ on the left side.       Bicep reflexes are 2+ on the right side and 2+ on the left side.       Brachioradialis reflexes are 2+ on the right side and 2+ on the left side.       Patellar reflexes are 2+ on the right side and 2+ on the left side.       Achilles reflexes are 2+ on the right side and 2+ on the left side.  Skin: Skin is warm. No rash noted.   Psychiatric: She has a normal mood and affect. Her behavior is normal. Judgment and thought content normal.    Nursing note and vitals reviewed.      ASSESSMENT and PLAN  Encounter Diagnoses   Name Primary?   ??? Chronic midline low back pain, with sciatica presence unspecified Yes   ??? Lumbar post-laminectomy syndrome    ??? Cervical radiculopathy    ??? Lumbar radiculopathy     medications as reviewed above  Follow-up 2 months

## 2015-03-28 NOTE — Progress Notes (Signed)
Nursing Notes    Patient presents to the office today in follow-up.   Patient rates her pain at 6/10 on the numerical pain scale.     Reviewed medications with counts as follows:    Rx Date filled Qty Dispensed Pill Count Last Dose Short   nucynta 50 03/04/15 120 26 03/28/15 no         Comments: pt c/o occasional bowel incontinence for about 3 weeks now. Just had back xrays done by pain clinic in [place] that she will transfer too once her move there is complete.     POC UDS was not performed in office today    Any new labs or imaging since last appointment? YES, back xray    Have you been to an emergency room (ER) or urgent care clinic since your last visit?  NO            Have you been hospitalized since your last visit? NO     If yes, where, when, and reason for visit?     Have you seen or consulted any other health care providers outside of the [facility]  since your last visit?  YES     If yes, where, when, and reason for visit? Pain clinic in [place]    Ms. Lange has a reminder for a "due or due soon" health maintenance. I have asked that she contact her primary care provider for follow-up on this health maintenance.

## 2015-03-29 ENCOUNTER — Encounter: Attending: Physician Assistant | Primary: Internal Medicine

## 2015-05-23 ENCOUNTER — Encounter: Attending: Physician Assistant | Primary: Internal Medicine

## 2015-09-17 DIAGNOSIS — M47817 Spondylosis without myelopathy or radiculopathy, lumbosacral region: Secondary | ICD-10-CM | POA: Insufficient documentation

## 2015-09-17 MED ORDER — PANTOPRAZOLE 40 MG TAB, DELAYED RELEASE
40 mg | ORAL_TABLET | ORAL | 3 refills | Status: DC
Start: 2015-09-17 — End: 2017-01-22

## 2015-11-23 ENCOUNTER — Encounter

## 2016-03-24 ENCOUNTER — Encounter: Attending: Internal Medicine | Primary: Internal Medicine

## 2016-03-24 ENCOUNTER — Ambulatory Visit
Admit: 2016-03-24 | Discharge: 2016-03-24 | Payer: PRIVATE HEALTH INSURANCE | Attending: Internal Medicine | Primary: Internal Medicine

## 2016-03-24 DIAGNOSIS — I10 Essential (primary) hypertension: Secondary | ICD-10-CM

## 2016-03-24 DIAGNOSIS — G935 Compression of brain: Secondary | ICD-10-CM | POA: Insufficient documentation

## 2016-03-24 MED ORDER — FUROSEMIDE 40 MG TAB
40 mg | ORAL_TABLET | ORAL | 3 refills | Status: DC
Start: 2016-03-24 — End: 2016-07-10

## 2016-03-24 MED ORDER — METOPROLOL SUCCINATE SR 100 MG 24 HR TAB
100 mg | ORAL_TABLET | Freq: Every day | ORAL | 3 refills | Status: DC
Start: 2016-03-24 — End: 2016-07-10

## 2016-03-24 NOTE — Progress Notes (Signed)
Bridget SparrowJanet Mize Morton is a 60 y.o.  female and presents with     Chief Complaint   Patient presents with   ??? Hypertension   ??? Back Pain   ??? Diarrhea   ??? Pain (Chronic)   ??? Skin Problem   ??? Depression   ??? Incontinence       Pt is here to establish care. Pt is originally from [place] and was seeing Dr Melina SchoolsSzulc Pt moved to [place] ,[place] and recently moved back to [place].  Pt has multiple health conditions    Pt has h/o chronic neck pain , back pain, had surgeries.    Pt has h/o gastric bypass.  Pt has chronic lose 1-2 BMs per day.  Pt also has h/o depression.Pt has memory problems.  Pt has noticed blisters on her arms and legs that has been going on sicne last year.  Pt has seen 3 dermatoligists  Never had skin biopsy.  Pt has bladder and bowel incont.  Pt also has swelling in legs.        Past Medical History:   Diagnosis Date   ??? Arthritis    ??? Chronic pain    ??? H/O colonoscopy 2010    due 2020     Past Surgical History:   Procedure Laterality Date   ??? HX APPENDECTOMY     ??? HX CESAREAN SECTION  1978, 1982   ??? HX GASTRIC BYPASS  2004   ??? HX GYN     ??? HX KNEE REPLACEMENT      bilateral   ??? HX MOHS PROCEDURES      x 3 on right   ??? HX ORTHOPAEDIC      L4-L5 fusion   ??? HX PARTIAL HYSTERECTOMY  2000    supracervical   ??? HX TONSIL AND ADENOIDECTOMY      childhood     Current Outpatient Prescriptions   Medication Sig   ??? tapentadol (NUCYNTA) 50 mg tablet Take 50 mg by mouth every four (4) hours as needed for Pain.   ??? metoprolol succinate (TOPROL-XL) 100 mg tablet Take 1 Tab by mouth daily.   ??? furosemide (LASIX) 40 mg tablet One po daily   ??? pantoprazole (PROTONIX) 40 mg tablet TAKE 1 TABLET BY MOUTH DAILY   ??? tolterodine ER (DETROL LA) 4 mg ER capsule TAKE 1 CAPSULE BY MOUTH DAILY   ??? traZODone (DESYREL) 100 mg tablet TAKE 1 TABLET BY MOUTH EVERY NIGHT AT BEDTIME prn sleep   ??? pregabalin (LYRICA) 100 mg capsule TAKE ONE CAPSULE BY MOUTH EVERY MORNING AND 2 CAPSULES BY MOUTH EVERY EVENING    ??? furosemide (LASIX) 20 mg tablet TAKE 1 TABLET BY MOUTH TWICE DAILY. GENERIC FOR LASIX.   ??? potassium chloride (KAON 10%) 20 mEq/15 mL solution Take 5 mL by mouth daily.   ??? BUPROPION HCL (WELLBUTRIN PO) Take  by mouth daily.   ??? venlafaxine (EFFEXOR) 75 mg tablet TAKE 1 TABLET BY MOUTH TWICE DAILY   ??? fluticasone (FLONASE) 50 mcg/actuation nasal spray SPRAY 2 SPRAYS IN BOTH NOSTRILS DAILY   ??? linaclotide (LINZESS) 145 mcg cap capsule Take  by mouth daily as needed.   ??? celecoxib (CELEBREX) 200 mg capsule Take 200 mg by mouth daily.   ??? montelukast (SINGULAIR) 10 mg tablet Take 10 mg by mouth daily.   ??? multivitamin (ONE A DAY) tablet Take 1 Tab by mouth daily.   ??? calcium citrate-vitamin D3 (CITRACAL WITH VITAMIN D MAXIMUM) tablet Take 2  Tabs by mouth two (2) times a day.     No current facility-administered medications for this visit.      Health Maintenance   Topic Date Due   ??? Hepatitis C Screening  1955/07/01   ??? DTaP/Tdap/Td series (1 - Tdap) 10/14/1977   ??? BREAST CANCER SCRN MAMMOGRAM  08/20/2015   ??? Influenza Age 31 to Adult  11/27/2015   ??? COLONOSCOPY  07/14/2018     Immunization History   Administered Date(s) Administered   ??? Influenza Vaccine 02/17/2012, 02/10/2013, 01/17/2014     No LMP recorded. Patient has had a hysterectomy.        Allergies and Intolerances:   Allergies   Allergen Reactions   ??? Opana Er [Oxymorphone] Other (comments)     delirium       Family History:   Family History   Problem Relation Age of Onset   ??? Alcohol abuse Mother    ??? Cancer Mother      breast   ??? Cancer Father      lung   ??? Diabetes Father    ??? Heart Disease Father    ??? Lung Disease Father    ??? Hypertension Father    ??? Cancer Brother      lung       Social History:   She  reports that she quit smoking about 5 years ago. Her smoking use included Cigarettes. She quit after 10.00 years of use. She has never used smokeless tobacco.  She  reports that she drinks alcohol.            Review of Systems:    General: negative for - chills, fatigue, fever, weight change  Psych: negative for - anxiety, pos for depression  ENT: negative for - headaches, hearing change, nasal congestion, oral lesions, sneezing or sore throat  Heme/ Lymph: negative for - bleeding problems, bruising, pallor or swollen lymph nodes  Endo: negative for - hot flashes, polydipsia/polyuria or temperature intolerance  Resp: negative for - cough, shortness of breath or wheezing  CV: negative for - chest pain, edema or palpitations  GI: negative for - abdominal pain, change in bowel habits, constipation, pos for diarrhea  GU: negative for - dysuria, hematuria, incontinence, pelvic pain or vulvar/vaginal symptoms,pos for urinary and bowel incont  MSK: negative for - joint pain, joint swelling or muscle pain,pos for back pain  Neuro: negative for - confusion, headaches, seizures or weakness  Derm: negative for - dry skin, hair changes, rash or skin lesion changes          Physical:   Vitals:   Vitals:    03/24/16 1524   BP: (!) 165/100   Pulse: (!) 107   Resp: 17   Temp: 98.6 ??F (37 ??C)   TempSrc: Oral   SpO2: 92%   Weight: 254 lb (115.2 kg)   Height: 5\' 1"  (1.549 m)           Exam:   HEENT- atraumatic,normocephalic, awake, oriented, well nourished  Neck - supple,no enlarged lymph nodes, no JVD, no thyromegaly  Chest- CTA, no rhonchi, no crackles  Heart- rrr, no murmurs / gallop/rub  Abdomen- soft,BS+,NT, no hepatosplenomegaly  Ext - no c/c/edema ,excoriations on arms and legs  Neuro- no focal deficits.Power 5/5 all extremities  Skin - warm,dry, no obvious rashes.          Review of Data:   LABS:   Lab Results   Component Value Date/Time  WBC 4.5 11/15/2014 10:14 AM    HGB 12.4 11/15/2014 10:14 AM    HCT 38.7 11/15/2014 10:14 AM    PLATELET 171 11/15/2014 10:14 AM     Lab Results   Component Value Date/Time    Sodium 142 11/15/2014 10:14 AM    Potassium 4.0 11/15/2014 10:14 AM    Chloride 103 11/15/2014 10:14 AM    CO2 27 11/15/2014 10:14 AM     Glucose 76 11/15/2014 10:14 AM    BUN 12 11/15/2014 10:14 AM    Creatinine 0.91 11/15/2014 10:14 AM     Lab Results   Component Value Date/Time    Cholesterol, total 194 10/20/2013 02:17 PM    HDL Cholesterol 83 10/20/2013 02:17 PM    LDL, calculated 95 10/20/2013 02:17 PM    Triglyceride 79 10/20/2013 02:17 PM     No results found for: GPT        Impression / Plan:        ICD-10-CM ICD-9-CM    1. Essential hypertension I10 401.9 LIPID PANEL      metoprolol succinate (TOPROL-XL) 100 mg tablet   2. Fibromyalgia M79.7 729.1    3. Edema, unspecified type R60.9 782.3    4. Chronic pain syndrome G89.4 338.4    5. Gastroesophageal reflux disease without esophagitis K21.9 530.81    6. Chronic diarrhea K52.9 787.91 CBC WITH AUTOMATED DIFF      METABOLIC PANEL, COMPREHENSIVE      CELIAC ANTIBODY PROFILE   7. H/O gastric bypass Z98.890 V45.86    8. Chiari malformation type I (HCC) G93.5 348.4    9. H/O: hysterectomy Z90.710 V88.01    10. Post-menopausal Z78.0 V49.81 DEXA BONE DENSITY STUDY AXIAL   11. Breast cancer screening Z12.31 V76.10 MAM MAMMO BI SCREENING INCL CAD   12. Vitamin B12 deficiency E53.8 266.2 PARIETAL CELL AB      INTRINSIC FACTOR AB   13. Positive ANA (antinuclear antibody) R76.8 795.79 ANA QL, W/REFLEX CASCADE   14. Fatigue, unspecified type R53.83 780.79 TSH 3RD GENERATION   15. Leg swelling M79.89 729.81 furosemide (LASIX) 40 mg tablet     Rash on arm most likely due to meds.    Asked pt to discuss with pain management regarding reducing and stopping lyrica ,as it could be the cuase of leg swelling and lesions on the skin.    Explained to patient risk benefits of the medications.Advised patient to stop meds if having any side effects.Pt verbalized understanding of the instructions.    I have discussed the diagnosis with the patient and the intended plan as seen in the above orders.  The patient has received an after-visit summary and questions were answered concerning future plans.  I have discussed  medication side effects and warnings with the patient as well. I have reviewed the plan of care with the patient, accepted their input and they are in agreement with the treatment goals.     Reviewed plan of care. Patient has provided input and agrees with goals.    Follow-up Disposition: Not on File    Jenene Slicker, MD

## 2016-04-04 ENCOUNTER — Inpatient Hospital Stay: Payer: PRIVATE HEALTH INSURANCE | Attending: Internal Medicine | Primary: Internal Medicine

## 2016-04-04 ENCOUNTER — Ambulatory Visit: Payer: PRIVATE HEALTH INSURANCE | Primary: Internal Medicine

## 2016-04-08 ENCOUNTER — Encounter: Attending: Internal Medicine | Primary: Internal Medicine

## 2016-04-09 ENCOUNTER — Ambulatory Visit
Admit: 2016-04-09 | Discharge: 2016-04-09 | Payer: PRIVATE HEALTH INSURANCE | Attending: Internal Medicine | Primary: Internal Medicine

## 2016-04-09 ENCOUNTER — Inpatient Hospital Stay: Admit: 2016-04-10 | Payer: PRIVATE HEALTH INSURANCE | Primary: Internal Medicine

## 2016-04-09 DIAGNOSIS — K120 Recurrent oral aphthae: Secondary | ICD-10-CM

## 2016-04-09 DIAGNOSIS — Z1159 Encounter for screening for other viral diseases: Secondary | ICD-10-CM

## 2016-04-09 DIAGNOSIS — I10 Essential (primary) hypertension: Secondary | ICD-10-CM | POA: Insufficient documentation

## 2016-04-09 MED ORDER — LIDOCAINE 2 % MUCOSAL SOLN
2 % | 3 refills | Status: DC | PRN
Start: 2016-04-09 — End: 2016-12-22

## 2016-04-09 MED ORDER — TRIAMCINOLONE ACETONIDE 0.1 % DENTAL PASTE
0.1 % | DENTAL | 3 refills | Status: DC
Start: 2016-04-09 — End: 2016-12-22

## 2016-04-09 NOTE — Progress Notes (Signed)
1. Have you been to the ER, urgent care clinic since your last visit?  Hospitalized since your last visit?No    2. Have you seen or consulted any other health care providers outside of the [facility] since your last visit?  Include any pap smears or colon screening. No

## 2016-04-09 NOTE — Progress Notes (Signed)
Bridget Morton is a 60 y.o.  female and presents with     Chief Complaint   Patient presents with   ??? Hypertension   ??? Leg Swelling   ??? Mouth Lesions       Pt says blood pressure has improved on the meds. Pt also mentions that the leg swelling has improved.  Pt has noticed blisters and ulcers in her mouth. She also says tha tshe keeps getting lesions on her arms and legs. Joints hurt  And she has dry mouth. Pt had knee surgery. Pt sees pain management.  Pt sees psych and has seen 3 different dermatologists in the past.    Past Medical History:   Diagnosis Date   ??? Arthritis    ??? Chronic pain    ??? H/O colonoscopy 2010    due 2020     Past Surgical History:   Procedure Laterality Date   ??? HX APPENDECTOMY     ??? HX CESAREAN SECTION  1978, 1982   ??? HX GASTRIC BYPASS  2004   ??? HX GYN     ??? HX KNEE REPLACEMENT      bilateral   ??? HX MOHS PROCEDURES      x 3 on right   ??? HX ORTHOPAEDIC      L4-L5 fusion   ??? HX PARTIAL HYSTERECTOMY  2000    supracervical   ??? HX TONSIL AND ADENOIDECTOMY      childhood     Current Outpatient Prescriptions   Medication Sig   ??? lidocaine (XYLOCAINE) 2 % solution Take 15 mL by mouth as needed for Pain.   ??? triamcinolone acetonide (ORALONE) 0.1 % dental paste Apply to the mucosa of the mouth twice daily   ??? tapentadol (NUCYNTA) 50 mg tablet Take 50 mg by mouth every four (4) hours as needed for Pain.   ??? metoprolol succinate (TOPROL-XL) 100 mg tablet Take 1 Tab by mouth daily.   ??? furosemide (LASIX) 40 mg tablet One po daily   ??? pantoprazole (PROTONIX) 40 mg tablet TAKE 1 TABLET BY MOUTH DAILY   ??? pregabalin (LYRICA) 100 mg capsule TAKE ONE CAPSULE BY MOUTH EVERY MORNING AND 2 CAPSULES BY MOUTH EVERY EVENING   ??? potassium chloride (KAON 10%) 20 mEq/15 mL solution Take 5 mL by mouth daily.   ??? BUPROPION HCL (WELLBUTRIN PO) Take  by mouth daily.   ??? linaclotide (LINZESS) 145 mcg cap capsule Take  by mouth daily as needed.   ??? celecoxib (CELEBREX) 200 mg capsule Take 200 mg by mouth daily.    ??? montelukast (SINGULAIR) 10 mg tablet Take 10 mg by mouth daily.   ??? calcium citrate-vitamin D3 (CITRACAL WITH VITAMIN D MAXIMUM) tablet Take 2 Tabs by mouth two (2) times a day.   ??? tolterodine ER (DETROL LA) 4 mg ER capsule TAKE 1 CAPSULE BY MOUTH DAILY   ??? multivitamin (ONE A DAY) tablet Take 1 Tab by mouth daily.     No current facility-administered medications for this visit.      Health Maintenance   Topic Date Due   ??? Hepatitis C Screening  03/19/1956   ??? DTaP/Tdap/Td series (1 - Tdap) 10/14/1977   ??? COLONOSCOPY  07/14/2018   ??? Influenza Age 15 to Adult  Completed     Immunization History   Administered Date(s) Administered   ??? Influenza Vaccine 02/17/2012, 02/10/2013, 01/17/2014     No LMP recorded. Patient has had a hysterectomy.  Allergies and Intolerances:   Allergies   Allergen Reactions   ??? Opana Er [Oxymorphone] Other (comments)     delirium       Family History:   Family History   Problem Relation Age of Onset   ??? Alcohol abuse Mother    ??? Cancer Mother      breast   ??? Cancer Father      lung   ??? Diabetes Father    ??? Heart Disease Father    ??? Lung Disease Father    ??? Hypertension Father    ??? Cancer Brother      lung       Social History:   She  reports that she quit smoking about 5 years ago. Her smoking use included Cigarettes. She quit after 10.00 years of use. She has never used smokeless tobacco.  She  reports that she drinks alcohol.            Review of Systems: pos for oral lesions  General: negative for - chills, fatigue, fever, weight change  Psych: negative for - anxiety, depression, irritability or mood swings  ENT: negative for - headaches, hearing change, nasal congestion, oral lesions, sneezing or sore throat  Heme/ Lymph: negative for - bleeding problems, bruising, pallor or swollen lymph nodes  Endo: negative for - hot flashes, polydipsia/polyuria or temperature intolerance  Resp: negative for - cough, shortness of breath or wheezing   CV: negative for - chest pain, edema or palpitations  GI: negative for - abdominal pain, change in bowel habits, constipation, diarrhea or nausea/vomiting  GU: negative for - dysuria, hematuria, incontinence, pelvic pain or vulvar/vaginal symptoms  MSK: negative for - joint pain, joint swelling or muscle pain  Neuro: negative for - confusion, headaches, seizures or weakness  Derm: negative for - dry skin, hair changes, rash or , pos for skin lesion changes on arms and legs          Physical:   Vitals:   Vitals:    04/09/16 1414   BP: 132/74   Pulse: 77   Resp: 16   Temp: 96.6 ??F (35.9 ??C)   TempSrc: Oral   SpO2: 95%   Weight: 242 lb 9.6 oz (110 kg)   Height: 5\' 1"  (1.549 m)           Exam:   HEENT- atraumatic,normocephalic, awake, oriented, well nourished, ? Small aopthous  Ulcers in mouth  Neck - supple,no enlarged lymph nodes, no JVD, no thyromegaly  Chest- CTA, no rhonchi, no crackles  Heart- rrr, no murmurs / gallop/rub  Abdomen- soft,BS+,NT, no hepatosplenomegaly  Ext - no c/c/edema , excoriatons on the arms   Neuro- no focal deficits.Power 5/5 all extremities  Skin - warm,dry, no obvious rashes.          Review of Data:   LABS:   Lab Results   Component Value Date/Time    WBC 4.5 11/15/2014 10:14 AM    HGB 12.4 11/15/2014 10:14 AM    HCT 38.7 11/15/2014 10:14 AM    PLATELET 171 11/15/2014 10:14 AM     Lab Results   Component Value Date/Time    Sodium 142 11/15/2014 10:14 AM    Potassium 4.0 11/15/2014 10:14 AM    Chloride 103 11/15/2014 10:14 AM    CO2 27 11/15/2014 10:14 AM    Glucose 76 11/15/2014 10:14 AM    BUN 12 11/15/2014 10:14 AM    Creatinine 0.91 11/15/2014 10:14 AM  Lab Results   Component Value Date/Time    Cholesterol, total 194 10/20/2013 02:17 PM    HDL Cholesterol 83 10/20/2013 02:17 PM    LDL, calculated 95 10/20/2013 02:17 PM    Triglyceride 79 10/20/2013 02:17 PM     No results found for: GPT        Impression / Plan:        ICD-10-CM ICD-9-CM     1. Aphthous ulcer of mouth K12.0 528.2 VITAMIN B12 & FOLATE      VITAMIN B1, WHOLE BLOOD      lidocaine (XYLOCAINE) 2 % solution      triamcinolone acetonide (ORALONE) 0.1 % dental paste   2. Essential hypertension I10 401.9    3. Leg swelling M79.89 729.81    4. Need for hepatitis C screening test Z11.59 V73.89 HEPATITIS C AB     HTN - improved    Leg swelling - improved.    ? Skin lesions - ? Lyrica could be causing the symptoms. Asked pt to discsus with pain management.    Spent over 30 minutes with pt addressing her health conditions           Explained to patient risk benefits of the medications.Advised patient to stop meds if having any side effects.Pt verbalized understanding of the instructions.    I have discussed the diagnosis with the patient and the intended plan as seen in the above orders.  The patient has received an after-visit summary and questions were answered concerning future plans.  I have discussed medication side effects and warnings with the patient as well. I have reviewed the plan of care with the patient, accepted their input and they are in agreement with the treatment goals.     Reviewed plan of care. Patient has provided input and agrees with goals.    Follow-up Disposition:  Return in about 1 month (around 05/10/2016).    Jenene SlickerPravin M Darriana Deboy, MD

## 2016-04-11 LAB — HEPATITIS C ANTIBODY: HCV Ab: <0.1 s/co ratio (ref 0.0–0.9)

## 2016-04-11 LAB — HEPATITIS C AB: Hep C Virus Ab: <0.1 s/co ratio (ref 0.0–0.9)

## 2016-04-11 LAB — VITAMIN B12 & FOLATE
Folate: 19.5 ng/mL (ref >3.0)
Vitamin B12: 407 pg/mL (ref 232–1245)

## 2016-04-11 LAB — VITAMIN B1, WHOLE BLOOD: Vitamin B1: 115.5 nmol/L (ref 66.5–200.0)

## 2016-04-14 ENCOUNTER — Encounter: Attending: Internal Medicine | Primary: Internal Medicine

## 2016-04-28 DIAGNOSIS — M961 Postlaminectomy syndrome, not elsewhere classified: Secondary | ICD-10-CM

## 2016-04-28 DIAGNOSIS — M4310 Spondylolisthesis, site unspecified: Secondary | ICD-10-CM

## 2016-04-28 DIAGNOSIS — M5020 Other cervical disc displacement, unspecified cervical region: Secondary | ICD-10-CM

## 2016-04-28 DIAGNOSIS — M4716 Other spondylosis with myelopathy, lumbar region: Secondary | ICD-10-CM

## 2016-04-28 DIAGNOSIS — M533 Sacrococcygeal disorders, not elsewhere classified: Secondary | ICD-10-CM

## 2016-04-28 DIAGNOSIS — M5417 Radiculopathy, lumbosacral region: Secondary | ICD-10-CM

## 2016-04-28 DIAGNOSIS — M4712 Other spondylosis with myelopathy, cervical region: Secondary | ICD-10-CM

## 2016-04-28 DIAGNOSIS — M47817 Spondylosis without myelopathy or radiculopathy, lumbosacral region: Secondary | ICD-10-CM

## 2016-04-28 HISTORY — DX: Other spondylosis with myelopathy, lumbar region: M47.16

## 2016-04-28 HISTORY — PX: SPINAL CORD STIMULATOR IMPLANT: SHX2422

## 2016-04-28 HISTORY — DX: Other cervical disc displacement, unspecified cervical region: M50.20

## 2016-04-28 HISTORY — DX: Sacrococcygeal disorders, not elsewhere classified: M53.3

## 2016-04-28 HISTORY — DX: Postlaminectomy syndrome, not elsewhere classified: M96.1

## 2016-04-28 HISTORY — DX: Spondylosis without myelopathy or radiculopathy, lumbosacral region: M47.817

## 2016-04-28 HISTORY — DX: Other spondylosis with myelopathy, cervical region: M47.12

## 2016-04-28 HISTORY — DX: Radiculopathy, lumbosacral region: M54.17

## 2016-04-28 HISTORY — DX: Spondylolisthesis, site unspecified: M43.10

## 2016-04-28 HISTORY — PX: OTHER SURGICAL HISTORY: SHX169

## 2016-05-12 ENCOUNTER — Encounter: Attending: Internal Medicine | Primary: Internal Medicine

## 2016-05-15 ENCOUNTER — Ambulatory Visit
Admit: 2016-05-15 | Discharge: 2016-05-15 | Payer: PRIVATE HEALTH INSURANCE | Attending: Internal Medicine | Primary: Internal Medicine

## 2016-05-15 DIAGNOSIS — J029 Acute pharyngitis, unspecified: Secondary | ICD-10-CM

## 2016-05-15 NOTE — Progress Notes (Signed)
Bridget Morton is a 61 y.o.  female and presents with     Chief Complaint   Patient presents with   ??? Sore Throat   ??? Mouth Lesions   ??? Back Pain   ??? Diarrhea   ??? Skin Problem       Pt has been applying steroid paste inside her mouth where she has some mouth lesions but they have helped only a little bit.  She thinks they are still there.  Pt also lesions in her arms . She spoke to pain management and they do not think it is due to Lyrica .  Pt says currently she does not have diarrhea.  She does have h/o joint pains.  NO wt loss.  Pt does use astringents such as listerine for mouth wash sometimes      Past Medical History:   Diagnosis Date   ??? Arthritis    ??? Chronic pain    ??? H/O colonoscopy 2010    due 2020     Past Surgical History:   Procedure Laterality Date   ??? HX APPENDECTOMY     ??? HX CESAREAN SECTION  1978, 1982   ??? HX GASTRIC BYPASS  2004   ??? HX GYN     ??? HX KNEE REPLACEMENT      bilateral   ??? HX MOHS PROCEDURES      x 3 on right   ??? HX ORTHOPAEDIC      L4-L5 fusion   ??? HX PARTIAL HYSTERECTOMY  2000    supracervical   ??? HX TONSIL AND ADENOIDECTOMY      childhood     Current Outpatient Prescriptions   Medication Sig   ??? lidocaine (XYLOCAINE) 2 % solution Take 15 mL by mouth as needed for Pain.   ??? triamcinolone acetonide (ORALONE) 0.1 % dental paste Apply to the mucosa of the mouth twice daily   ??? tapentadol (NUCYNTA) 50 mg tablet Take 50 mg by mouth every four (4) hours as needed for Pain.   ??? metoprolol succinate (TOPROL-XL) 100 mg tablet Take 1 Tab by mouth daily.   ??? furosemide (LASIX) 40 mg tablet One po daily   ??? pantoprazole (PROTONIX) 40 mg tablet TAKE 1 TABLET BY MOUTH DAILY   ??? pregabalin (LYRICA) 100 mg capsule TAKE ONE CAPSULE BY MOUTH EVERY MORNING AND 2 CAPSULES BY MOUTH EVERY EVENING   ??? potassium chloride (KAON 10%) 20 mEq/15 mL solution Take 5 mL by mouth daily.   ??? BUPROPION HCL (WELLBUTRIN PO) Take  by mouth daily.   ??? celecoxib (CELEBREX) 200 mg capsule Take 200 mg by mouth daily.    ??? multivitamin (ONE A DAY) tablet Take 1 Tab by mouth daily.   ??? calcium citrate-vitamin D3 (CITRACAL WITH VITAMIN D MAXIMUM) tablet Take 2 Tabs by mouth two (2) times a day.   ??? tolterodine ER (DETROL LA) 4 mg ER capsule TAKE 1 CAPSULE BY MOUTH DAILY   ??? linaclotide (LINZESS) 145 mcg cap capsule Take  by mouth daily as needed.   ??? montelukast (SINGULAIR) 10 mg tablet Take 10 mg by mouth daily.     No current facility-administered medications for this visit.      Health Maintenance   Topic Date Due   ??? DTaP/Tdap/Td series (1 - Tdap) 10/14/1977   ??? BREAST CANCER SCRN MAMMOGRAM  08/20/2015   ??? COLONOSCOPY  07/14/2018   ??? Hepatitis C Screening  Completed   ??? Influenza Age 54 to Adult  Completed     Immunization History   Administered Date(s) Administered   ??? Influenza Vaccine 02/17/2012, 02/10/2013, 01/17/2014   ??? Influenza Vaccine (Quad) PF 04/09/2016     No LMP recorded. Patient has had a hysterectomy.        Allergies and Intolerances:   Allergies   Allergen Reactions   ??? Opana Er [Oxymorphone] Other (comments)     delirium       Family History:   Family History   Problem Relation Age of Onset   ??? Alcohol abuse Mother    ??? Cancer Mother      breast   ??? Cancer Father      lung   ??? Diabetes Father    ??? Heart Disease Father    ??? Lung Disease Father    ??? Hypertension Father    ??? Cancer Brother      lung       Social History:   She  reports that she quit smoking about 5 years ago. Her smoking use included Cigarettes. She quit after 10.00 years of use. She has never used smokeless tobacco.  She  reports that she drinks alcohol.            Review of Systems: pos for oral lesions  General: negative for - chills, fatigue, fever, weight change  Psych: negative for - anxiety, depression, irritability or mood swings  ENT: negative for - headaches, hearing change, nasal congestion, oral lesions, sneezing or sore throat  Heme/ Lymph: negative for - bleeding problems, bruising, pallor or swollen lymph nodes   Endo: negative for - hot flashes, polydipsia/polyuria or temperature intolerance  Resp: negative for - cough, shortness of breath or wheezing  CV: negative for - chest pain, edema or palpitations  GI: negative for - abdominal pain, change in bowel habits, constipation, diarrhea or nausea/vomiting  GU: negative for - dysuria, hematuria, incontinence, pelvic pain or vulvar/vaginal symptoms  MSK: negative for - joint pain, joint swelling or muscle pain  Neuro: negative for - confusion, headaches, seizures or weakness  Derm: negative for - dry skin, hair changes, rash or skin lesion changes          Physical:   Vitals:   Vitals:    05/15/16 1618   BP: 151/86   Pulse: 87   Resp: 18   Temp: 98.6 ??F (37 ??C)   TempSrc: Oral   SpO2: 97%   Weight: 243 lb (110.2 kg)   Height: 5\' 1"  (1.549 m)           Exam:   HEENT- atraumatic,normocephalic, awake, oriented, well nourished, small indisttinct 1 mm lesions - mildly erythematous lesions that look like aphthous ulcers on mucosa of oral cavity  Neck - supple,no enlarged lymph nodes, no JVD, no thyromegaly  Chest- CTA, no rhonchi, no crackles  Heart- rrr, no murmurs / gallop/rub  Abdomen- soft,BS+,NT, no hepatosplenomegaly  Ext - no c/c/edema   Neuro- no focal deficits.Power 5/5 all extremities  Skin - warm,dry, no obvious rashes., some hypopigmentted lesions on arms and mild erythematous non tender lesions on both arms          Review of Data:   LABS:   Lab Results   Component Value Date/Time    WBC 4.5 11/15/2014 10:14 AM    HGB 12.4 11/15/2014 10:14 AM    HCT 38.7 11/15/2014 10:14 AM    PLATELET 171 11/15/2014 10:14 AM     Lab Results   Component Value Date/Time  Sodium 142 11/15/2014 10:14 AM    Potassium 4.0 11/15/2014 10:14 AM    Chloride 103 11/15/2014 10:14 AM    CO2 27 11/15/2014 10:14 AM    Glucose 76 11/15/2014 10:14 AM    BUN 12 11/15/2014 10:14 AM    Creatinine 0.91 11/15/2014 10:14 AM     Lab Results   Component Value Date/Time     Cholesterol, total 194 10/20/2013 02:17 PM    HDL Cholesterol 83 10/20/2013 02:17 PM    LDL, calculated 95 10/20/2013 02:17 PM    Triglyceride 79 10/20/2013 02:17 PM     No results found for: GPT        Impression / Plan:        ICD-10-CM ICD-9-CM    1. Sore throat J02.9 462 AMB POC RAPID STREP A   2. Diarrhea, unspecified type R19.7 787.91 CBC WITH AUTOMATED DIFF      METABOLIC PANEL, COMPREHENSIVE      PARIETAL CELL AB      INTRINSIC FACTOR AB      TSH 3RD GENERATION   3. Chronic fatigue R53.82 780.79 ANA QL, W/REFLEX CASCADE      CELIAC ANTIBODY PROFILE   4. Hypercholesteremia E78.00 272.0 LIPID PANEL   5. Hypovitaminosis D E55.9 268.9 VITAMIN D, 1, 25 DIHYDROXY   6. Oral aphthous ulcer K12.0 528.2 REFERRAL TO ENT-OTOLARYNGOLOGY     R/o vasculitis/ MCTD/ ? Drug related/ photodermatitis ?  Will get ENT opinion for oral lesions.  Asked pt to stop using listerine or any other mouth wash for now.      Diarrhea - resolved for now.,    Soebt over 30 minutes with pt getting history , trying to find the etiology of skin lesions and oral lesions.        Explained to patient risk benefits of the medications.Advised patient to stop meds if having any side effects.Pt verbalized understanding of the instructions.    I have discussed the diagnosis with the patient and the intended plan as seen in the above orders.  The patient has received an after-visit summary and questions were answered concerning future plans.  I have discussed medication side effects and warnings with the patient as well. I have reviewed the plan of care with the patient, accepted their input and they are in agreement with the treatment goals.     Reviewed plan of care. Patient has provided input and agrees with goals.    Follow-up Disposition: Not on File    Jenene Slicker, MD

## 2016-05-15 NOTE — Progress Notes (Signed)
1. Have you been to the ER, urgent care clinic since your last visit?  Hospitalized since your last visit?No    2. Have you seen or consulted any other health care providers outside of the [facility] since your last visit?  Include any pap smears or colon screening. No

## 2016-05-20 ENCOUNTER — Inpatient Hospital Stay: Admit: 2016-05-20 | Payer: PRIVATE HEALTH INSURANCE | Attending: Internal Medicine | Primary: Internal Medicine

## 2016-05-20 DIAGNOSIS — R197 Diarrhea, unspecified: Secondary | ICD-10-CM

## 2016-05-20 DIAGNOSIS — Z1231 Encounter for screening mammogram for malignant neoplasm of breast: Secondary | ICD-10-CM

## 2016-05-20 DIAGNOSIS — Z780 Asymptomatic menopausal state: Secondary | ICD-10-CM

## 2016-05-20 LAB — C REACTIVE PROTEIN, QT: C-Reactive protein: <0.3 mg/dL (ref 0–0.3)

## 2016-05-20 LAB — TSH 3RD GENERATION: TSH: 3.35 u[IU]/mL (ref 0.36–3.74)

## 2016-05-20 LAB — CBC WITH AUTOMATED DIFF
ABS. BASOPHILS: 0 10*3/uL (ref 0.0–0.06)
ABS. EOSINOPHILS: 0 10*3/uL (ref 0.0–0.4)
ABS. LYMPHOCYTES: 1 10*3/uL (ref 0.9–3.6)
ABS. MONOCYTES: 0.2 10*3/uL (ref 0.05–1.2)
ABS. NEUTROPHILS: 2.4 10*3/uL (ref 1.8–8.0)
BASOPHILS: 1 % (ref 0–2)
EOSINOPHILS: 0 % (ref 0–5)
HCT: 41.3 % (ref 35.0–45.0)
HGB: 13.3 g/dL (ref 12.0–16.0)
LYMPHOCYTES: 29 % (ref 21–52)
MCH: 25.7 PG (ref 24.0–34.0)
MCHC: 32.2 g/dL (ref 31.0–37.0)
MCV: 79.7 FL (ref 74.0–97.0)
MONOCYTES: 5 % (ref 3–10)
MPV: 10.5 FL (ref 9.2–11.8)
NEUTROPHILS: 65 % (ref 40–73)
PLATELET: 181 10*3/uL (ref 135–420)
RBC: 5.18 M/uL (ref 4.20–5.30)
RDW: 14.6 % — ABNORMAL HIGH (ref 11.6–14.5)
WBC: 3.6 10*3/uL — ABNORMAL LOW (ref 4.6–13.2)

## 2016-05-20 LAB — METABOLIC PANEL, COMPREHENSIVE
A-G Ratio: 1.1 (ref 0.8–1.7)
ALT (SGPT): 13 U/L (ref 13–56)
AST (SGOT): 13 U/L — ABNORMAL LOW (ref 15–37)
Albumin: 3.8 g/dL (ref 3.4–5.0)
Alk. phosphatase: 199 U/L — ABNORMAL HIGH (ref 45–117)
Anion gap: 10 mmol/L (ref 3.0–18)
BUN/Creatinine ratio: 18 (ref 12–20)
BUN: 15 MG/DL (ref 7.0–18)
Bilirubin, total: 0.5 MG/DL (ref 0.2–1.0)
CO2: 32 mmol/L (ref 21–32)
Calcium: 8.6 MG/DL (ref 8.5–10.1)
Chloride: 100 mmol/L (ref 100–108)
Creatinine: 0.85 MG/DL (ref 0.6–1.3)
GFR est AA: >60 mL/min/{1.73_m2} (ref >60)
GFR est non-AA: >60 mL/min/{1.73_m2} (ref >60)
Globulin: 3.5 g/dL (ref 2.0–4.0)
Glucose: 93 mg/dL (ref 74–99)
Potassium: 4.2 mmol/L (ref 3.5–5.5)
Protein, total: 7.3 g/dL (ref 6.4–8.2)
Sodium: 142 mmol/L (ref 136–145)

## 2016-05-20 LAB — LIPID PANEL
CHOL/HDL Ratio: 2.4 (ref 0–5.0)
Cholesterol, total: 179 MG/DL (ref <200)
HDL Cholesterol: 76 MG/DL — ABNORMAL HIGH (ref 40–60)
LDL, calculated: 83 MG/DL (ref 0–100)
Triglyceride: 100 MG/DL (ref <150)
VLDL, calculated: 20 MG/DL

## 2016-05-21 LAB — ANA QL, W/REFLEX CASCADE
ANA DIRECT, 165188: NEGATIVE
ANA Direct: NEGATIVE

## 2016-05-21 LAB — INTRINSIC FACTOR AB: Intrinsic factor Abs: 1 AU/mL (ref 0.0–1.1)

## 2016-05-21 LAB — VITAMIN D, 1, 25 DIHYDROXY: Calcitriol (Vit D 1, 25 di-OH): 78.3 pg/mL (ref 19.9–79.3)

## 2016-05-21 LAB — PARIETAL CELL AB: Antiparietal Cell Ab: 3.5 Units (ref 0.0–20.0)

## 2016-05-21 LAB — SED RATE (ESR): Sed rate, automated: 18 mm/hr (ref 0–30)

## 2016-05-22 ENCOUNTER — Encounter

## 2016-05-22 LAB — CELIAC ANTIBODY PROFILE
DEAMIDATED GLIADIN ABS,IGA, 161651: 3 units (ref 0–19)
DEAMIDATED GLIADIN ABS,IGG, 161692: 3 units (ref 0–19)
Deamidated Gliadin Ab, IgA: 3 units (ref 0–19)
Deamidated Gliadin Ab, IgG: 3 units (ref 0–19)
Immunoglobulin A, Qt.: 197 mg/dL (ref 87–352)
Immunoglobulin A, Qt.: 197 mg/dL (ref 87–352)
T-TRANSGLUTAMINASE (TTG) IGG,164989: <2 U/mL (ref 0–5)
T-TRANSGLUTAMINASE, IGA, 164643: <2 U/mL (ref 0–3)
t-Transglutaminase, IgA: <2 U/mL (ref 0–3)
t-Transglutaminase, IgG: <2 U/mL (ref 0–5)

## 2016-05-23 NOTE — Telephone Encounter (Signed)
Pt called and stated that she blew her nose and a worm came out. Patient stated that Dr. Danella Pentoneshmukh and her was trying to figure out what was going on. [facility] she will be out of town for a week. Pt will call back to schedule appointment. This encounter will be closed.

## 2016-05-27 ENCOUNTER — Ambulatory Visit: Admit: 2016-05-27 | Discharge: 2016-05-27 | Attending: Internal Medicine | Primary: Internal Medicine

## 2016-05-27 DIAGNOSIS — F22 Delusional disorders: Secondary | ICD-10-CM

## 2016-05-27 NOTE — Progress Notes (Signed)
1. Have you been to the ER, urgent care clinic since your last visit?  Hospitalized since your last visit?No    2. Have you seen or consulted any other health care providers outside of the [facility] since your last visit?  Include any pap smears or colon screening. No

## 2016-05-27 NOTE — Progress Notes (Signed)
Bridget SparrowJanet Mize Morton is a 61 y.o.  female and presents with     Chief Complaint   Patient presents with   ??? Nasal Congestion   ??? Eyelid Inflammation       Pt says she pulled out a worm from secretions that she spit out that came from her nose. She also thinks that there may be worms on her eyelids.  Pt thinks she has worms in her gums. She has appt with Dentist.  Pt says it was about 2 inches long , and was thin ad not moving.  Pt does not think she is crazy.  Pt does see Psych and is taking Quetiapine.  Pt says she had worm come out of her mouth and showed it to her husband.        Past Medical History:   Diagnosis Date   ??? Arthritis    ??? Chronic pain    ??? H/O colonoscopy 2010    due 2020   ??? Menopause      Past Surgical History:   Procedure Laterality Date   ??? HX APPENDECTOMY     ??? HX BREAST BIOPSY Left     Benign   ??? HX CESAREAN SECTION  1978, 1982   ??? HX GASTRIC BYPASS  2004   ??? HX GYN     ??? HX KNEE REPLACEMENT      bilateral   ??? HX MOHS PROCEDURES      x 3 on right   ??? HX ORTHOPAEDIC      L4-L5 fusion   ??? HX PARTIAL HYSTERECTOMY  2000    supracervical   ??? HX TONSIL AND ADENOIDECTOMY      childhood     Current Outpatient Prescriptions   Medication Sig   ??? QUEtiapine (SEROQUEL) 200 mg tablet Take 200 mg by mouth two (2) times a day.   ??? lidocaine (XYLOCAINE) 2 % solution Take 15 mL by mouth as needed for Pain.   ??? triamcinolone acetonide (ORALONE) 0.1 % dental paste Apply to the mucosa of the mouth twice daily   ??? tapentadol (NUCYNTA) 50 mg tablet Take 50 mg by mouth every four (4) hours as needed for Pain.   ??? metoprolol succinate (TOPROL-XL) 100 mg tablet Take 1 Tab by mouth daily.   ??? furosemide (LASIX) 40 mg tablet One po daily   ??? pantoprazole (PROTONIX) 40 mg tablet TAKE 1 TABLET BY MOUTH DAILY   ??? pregabalin (LYRICA) 100 mg capsule TAKE ONE CAPSULE BY MOUTH EVERY MORNING AND 2 CAPSULES BY MOUTH EVERY EVENING   ??? potassium chloride (KAON 10%) 20 mEq/15 mL solution Take 5 mL by mouth daily.    ??? BUPROPION HCL (WELLBUTRIN PO) Take  by mouth daily.   ??? celecoxib (CELEBREX) 200 mg capsule Take 200 mg by mouth daily.   ??? calcium citrate-vitamin D3 (CITRACAL WITH VITAMIN D MAXIMUM) tablet Take 2 Tabs by mouth two (2) times a day.   ??? tolterodine ER (DETROL LA) 4 mg ER capsule TAKE 1 CAPSULE BY MOUTH DAILY   ??? linaclotide (LINZESS) 145 mcg cap capsule Take  by mouth daily as needed.   ??? montelukast (SINGULAIR) 10 mg tablet Take 10 mg by mouth daily.   ??? multivitamin (ONE A DAY) tablet Take 1 Tab by mouth daily.     No current facility-administered medications for this visit.      Health Maintenance   Topic Date Due   ??? DTaP/Tdap/Td series (1 - Tdap) 10/14/1977   ???  BREAST CANCER SCRN MAMMOGRAM  05/20/2018   ??? COLONOSCOPY  07/14/2018   ??? Hepatitis C Screening  Completed   ??? Influenza Age 37 to Adult  Completed     Immunization History   Administered Date(s) Administered   ??? Influenza Vaccine 02/17/2012, 02/10/2013, 01/17/2014   ??? Influenza Vaccine (Quad) PF 04/09/2016     No LMP recorded. Patient has had a hysterectomy.        Allergies and Intolerances:   Allergies   Allergen Reactions   ??? Opana Er [Oxymorphone] Other (comments)     delirium       Family History:   Family History   Problem Relation Age of Onset   ??? Alcohol abuse Mother    ??? Cancer Mother      breast   ??? Cancer Father      lung   ??? Diabetes Father    ??? Heart Disease Father    ??? Lung Disease Father    ??? Hypertension Father    ??? Cancer Brother      lung       Social History:   She  reports that she quit smoking about 5 years ago. Her smoking use included Cigarettes. She quit after 10.00 years of use. She has never used smokeless tobacco.  She  reports that she drinks alcohol.            Review of Systems:   General: negative for - chills, fatigue, fever, weight change  Psych: negative for - anxiety, depression, irritability or mood swings  ENT: negative for - headaches, hearing change, nasal congestion, oral lesions, sneezing or sore throat   Heme/ Lymph: negative for - bleeding problems, bruising, pallor or swollen lymph nodes  Endo: negative for - hot flashes, polydipsia/polyuria or temperature intolerance  Resp: negative for - cough, shortness of breath or wheezing  CV: negative for - chest pain, edema or palpitations  GI: negative for - abdominal pain, change in bowel habits, constipation, diarrhea or nausea/vomiting  GU: negative for - dysuria, hematuria, incontinence, pelvic pain or vulvar/vaginal symptoms  MSK: negative for - joint pain, joint swelling or muscle pain  Neuro: negative for - confusion, headaches, seizures or weakness  Derm: negative for - dry skin, hair changes, rash or skin lesion changes          Physical:   Vitals:   Vitals:    05/27/16 1501   BP: 121/60   Pulse: 74   Resp: 20   Temp: 98.5 ??F (36.9 ??C)   TempSrc: Oral   SpO2: 96%   Weight: 245 lb 12.8 oz (111.5 kg)   Height: 5\' 1"  (1.549 m)           Exam:   HEENT- atraumatic,normocephalic, awake, oriented, well nourished  Neck - supple,no enlarged lymph nodes, no JVD, no thyromegaly  Chest- CTA, no rhonchi, no crackles  Heart- rrr, no murmurs / gallop/rub  Abdomen- soft,BS+,NT, no hepatosplenomegaly  Ext - no c/c/edema   Neuro- no focal deficits.Power 5/5 all extremities  Skin - warm,dry, no obvious rashes.          Review of Data:   LABS:   Lab Results   Component Value Date/Time    WBC 3.6 05/20/2016 01:47 PM    HGB 13.3 05/20/2016 01:47 PM    HCT 41.3 05/20/2016 01:47 PM    PLATELET 181 05/20/2016 01:47 PM     Lab Results   Component Value Date/Time    Sodium 142  05/20/2016 01:47 PM    Potassium 4.2 05/20/2016 01:47 PM    Chloride 100 05/20/2016 01:47 PM    CO2 32 05/20/2016 01:47 PM    Glucose 93 05/20/2016 01:47 PM    BUN 15 05/20/2016 01:47 PM    Creatinine 0.85 05/20/2016 01:47 PM     Lab Results   Component Value Date/Time    Cholesterol, total 179 05/20/2016 01:47 PM    HDL Cholesterol 76 05/20/2016 01:47 PM    LDL, calculated 83 05/20/2016 01:47 PM     Triglyceride 100 05/20/2016 01:47 PM     No results found for: GPT        Impression / Plan:        ICD-10-CM ICD-9-CM    1. 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 She needs to follow up with Psych. Pt did have MRI brain 2016 that was normal.            Explained to patient risk benefits of the medications.Advised patient to stop meds if having any side effects.Pt verbalized understanding of the instructions.    I have discussed the diagnosis with the patient and the intended plan as seen in the above orders.  The patient has received an after-visit summary and questions were answered concerning future plans.  I have discussed medication side effects and warnings with the patient as well. I have reviewed the plan of care with the patient, accepted their input and they are in agreement with the treatment goals.     Reviewed plan of care. Patient has provided input and agrees with goals.    Follow-up Disposition: Not on File    Jenene Slicker, MD

## 2016-06-03 NOTE — Telephone Encounter (Signed)
Appt scheduled for 06/04/2016.

## 2016-06-04 ENCOUNTER — Inpatient Hospital Stay: Admit: 2016-06-04 | Payer: PRIVATE HEALTH INSURANCE | Attending: Internal Medicine | Primary: Internal Medicine

## 2016-06-04 DIAGNOSIS — R928 Other abnormal and inconclusive findings on diagnostic imaging of breast: Secondary | ICD-10-CM

## 2016-06-16 ENCOUNTER — Encounter: Attending: Internal Medicine | Primary: Internal Medicine

## 2016-06-24 NOTE — Progress Notes (Signed)
Pt was a No Show 06/16/16. Letter #1 sent 06/24/16. DMA.ms.

## 2016-07-10 ENCOUNTER — Encounter

## 2016-07-10 NOTE — Telephone Encounter (Signed)
From: Victorino SparrowJanet Mize Morton  To: Jenene SlickerPravin M Deshmukh, MD  Sent: 07/10/2016 12:14 PM EDT  Subject:  Prescription Question    Hello,    I  have a dental appointment tomorrow and need pre meds to take before I go. Their office said I needed to have my primary care doctor call in my meds because they have not seen me in over a year. Would you call me in a Rx for Amoxicillin 500mg , Take 4 caps. 1hour before dental treatment. They do this because of my knee replacements.    Thank  you  Bridget PlyJanet  Morton

## 2016-07-10 NOTE — Telephone Encounter (Signed)
From: Victorino SparrowJanet Mize Camuso  To: Jenene SlickerPravin M Deshmukh, MD  Sent: 07/10/2016 12:03 PM EDT  Subject:  Medication Renewal Request    Original  authorizing provider: Jenene SlickerPravin M Deshmukh, MD    Dion SaucierJanet  M. Searcy would like a refill of the following medications:  metoprolol  succinate (TOPROL-XL) 100 mg tablet Jenene Slicker[Pravin M Deshmukh, MD]  furosemide  (LASIX) 40 mg tablet Jenene Slicker[Pravin M Deshmukh, MD]    [facility]  [id number] - [place], [place] - [address] [facility] [address]  [place]    Comment:

## 2016-07-10 NOTE — Telephone Encounter (Signed)
Please advise.

## 2016-07-12 MED ORDER — METOPROLOL SUCCINATE SR 100 MG 24 HR TAB
100 mg | ORAL_TABLET | Freq: Every day | ORAL | 3 refills | Status: DC
Start: 2016-07-12 — End: 2016-11-20

## 2016-07-12 MED ORDER — FUROSEMIDE 40 MG TAB
40 mg | ORAL_TABLET | ORAL | 3 refills | Status: DC
Start: 2016-07-12 — End: 2016-09-30

## 2016-07-16 ENCOUNTER — Ambulatory Visit: Admit: 2016-07-16 | Discharge: 2016-07-16 | Attending: Internal Medicine | Primary: Internal Medicine

## 2016-07-16 DIAGNOSIS — K047 Periapical abscess without sinus: Secondary | ICD-10-CM

## 2016-07-16 MED ORDER — AMOXICILLIN 500 MG CAP
500 mg | ORAL_CAPSULE | ORAL | 0 refills | Status: DC
Start: 2016-07-16 — End: 2017-01-18

## 2016-07-16 NOTE — Progress Notes (Signed)
Bridget Morton is a 61 y.o.  female and presents with     Chief Complaint   Patient presents with   ??? Mental Health Problem   ??? Dental Problem   ??? Allergic Rhinitis       Pt thinks that she has parasites in her nose and on her tongue.  Pt thinks she has  Parasites in her nose and her tongue and on the skin.  Pt also thinks the food has parasites.  Pt is also supposed to get dental procedure and says she needs abx.  Pt does see Psych.        Past Medical History:   Diagnosis Date   ??? Arthritis    ??? Chronic pain    ??? H/O colonoscopy 2010    due 2020   ??? Menopause      Past Surgical History:   Procedure Laterality Date   ??? HX APPENDECTOMY     ??? HX BREAST BIOPSY Left     Benign   ??? HX CESAREAN SECTION  1978, 1982   ??? HX GASTRIC BYPASS  2004   ??? HX GYN     ??? HX KNEE REPLACEMENT      bilateral   ??? HX MOHS PROCEDURES      x 3 on right   ??? HX ORTHOPAEDIC      L4-L5 fusion   ??? HX PARTIAL HYSTERECTOMY  2000    supracervical   ??? HX TONSIL AND ADENOIDECTOMY      childhood     Current Outpatient Prescriptions   Medication Sig   ??? amoxicillin (AMOXIL) 500 mg capsule Take 4 capsules one hour  Before dental procedure   ??? metoprolol succinate (TOPROL-XL) 100 mg tablet Take 1 Tab by mouth daily.   ??? furosemide (LASIX) 40 mg tablet One po daily   ??? QUEtiapine (SEROQUEL) 200 mg tablet Take 200 mg by mouth two (2) times a day.   ??? lidocaine (XYLOCAINE) 2 % solution Take 15 mL by mouth as needed for Pain.   ??? triamcinolone acetonide (ORALONE) 0.1 % dental paste Apply to the mucosa of the mouth twice daily   ??? tapentadol (NUCYNTA) 50 mg tablet Take 50 mg by mouth every four (4) hours as needed for Pain.   ??? pantoprazole (PROTONIX) 40 mg tablet TAKE 1 TABLET BY MOUTH DAILY   ??? pregabalin (LYRICA) 100 mg capsule TAKE ONE CAPSULE BY MOUTH EVERY MORNING AND 2 CAPSULES BY MOUTH EVERY EVENING   ??? potassium chloride (KAON 10%) 20 mEq/15 mL solution Take 5 mL by mouth daily.   ??? BUPROPION HCL (WELLBUTRIN PO) Take  by mouth daily.    ??? celecoxib (CELEBREX) 200 mg capsule Take 200 mg by mouth daily.   ??? montelukast (SINGULAIR) 10 mg tablet Take 10 mg by mouth daily.   ??? multivitamin (ONE A DAY) tablet Take 1 Tab by mouth daily.   ??? calcium citrate-vitamin D3 (CITRACAL WITH VITAMIN D MAXIMUM) tablet Take 2 Tabs by mouth two (2) times a day.   ??? tolterodine ER (DETROL LA) 4 mg ER capsule TAKE 1 CAPSULE BY MOUTH DAILY   ??? linaclotide (LINZESS) 145 mcg cap capsule Take  by mouth daily as needed.     No current facility-administered medications for this visit.      Health Maintenance   Topic Date Due   ??? DTaP/Tdap/Td series (1 - Tdap) 10/14/1977   ??? BREAST CANCER SCRN MAMMOGRAM  06/04/2018   ??? COLONOSCOPY  07/14/2018   ??? Hepatitis C Screening  Completed   ??? Influenza Age 85 to Adult  Completed     Immunization History   Administered Date(s) Administered   ??? Influenza Vaccine 02/17/2012, 02/10/2013, 01/17/2014   ??? Influenza Vaccine (Quad) PF 04/09/2016     No LMP recorded. Patient has had a hysterectomy.        Allergies and Intolerances:   Allergies   Allergen Reactions   ??? Opana Er [Oxymorphone] Other (comments)     delirium       Family History:   Family History   Problem Relation Age of Onset   ??? Alcohol abuse Mother    ??? Cancer Mother      breast   ??? Cancer Father      lung   ??? Diabetes Father    ??? Heart Disease Father    ??? Lung Disease Father    ??? Hypertension Father    ??? Cancer Brother      lung       Social History:   She  reports that she quit smoking about 5 years ago. Her smoking use included Cigarettes. She quit after 10.00 years of use. She has never used smokeless tobacco.  She  reports that she drinks alcohol.            Review of Systems:   General: negative for - chills, fatigue, fever, weight change  Psych: negative for - anxiety, depression, irritability or mood swings  ENT: negative for - headaches, hearing change, nasal congestion, oral lesions, sneezing or sore throat   Heme/ Lymph: negative for - bleeding problems, bruising, pallor or swollen lymph nodes  Endo: negative for - hot flashes, polydipsia/polyuria or temperature intolerance  Resp: negative for - cough, shortness of breath or wheezing  CV: negative for - chest pain, edema or palpitations  GI: negative for - abdominal pain, change in bowel habits, constipation, diarrhea or nausea/vomiting  GU: negative for - dysuria, hematuria, incontinence, pelvic pain or vulvar/vaginal symptoms  MSK: negative for - joint pain, joint swelling or muscle pain  Neuro: negative for - confusion, headaches, seizures or weakness  Derm: negative for - dry skin, hair changes, rash or skin lesion changes          Physical:   Vitals:   Vitals:    07/16/16 1402   BP: 156/75   Pulse: 79   Resp: 15   Temp: 98.2 ??F (36.8 ??C)   TempSrc: Oral   SpO2: 98%   Weight: 244 lb (110.7 kg)   Height: 5\' 1"  (1.549 m)           Exam:   HEENT- atraumatic,normocephalic, awake, oriented, well nourished  Neck - supple,no enlarged lymph nodes, no JVD, no thyromegaly  Chest- CTA, no rhonchi, no crackles  Heart- rrr, no murmurs / gallop/rub  Abdomen- soft,BS+,NT, no hepatosplenomegaly  Ext - no c/c/edema   Neuro- no focal deficits.Power 5/5 all extremities  Skin - warm,dry, no obvious rashes.          Review of Data:   LABS:   Lab Results   Component Value Date/Time    WBC 3.6 (L) 05/20/2016 01:47 PM    HGB 13.3 05/20/2016 01:47 PM    HCT 41.3 05/20/2016 01:47 PM    PLATELET 181 05/20/2016 01:47 PM     Lab Results   Component Value Date/Time    Sodium 142 05/20/2016 01:47 PM    Potassium 4.2 05/20/2016 01:47 PM  Chloride 100 05/20/2016 01:47 PM    CO2 32 05/20/2016 01:47 PM    Glucose 93 05/20/2016 01:47 PM    BUN 15 05/20/2016 01:47 PM    Creatinine 0.85 05/20/2016 01:47 PM     Lab Results   Component Value Date/Time    Cholesterol, total 179 05/20/2016 01:47 PM    HDL Cholesterol 76 (H) 05/20/2016 01:47 PM    LDL, calculated 83 05/20/2016 01:47 PM     Triglyceride 100 05/20/2016 01:47 PM     No results found for: GPT        Impression / Plan:        ICD-10-CM ICD-9-CM    1. Dental infection K04.7 522.4    2. 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    I have discussed the diagnosis with the patient and the intended plan as seen in the above orders.  The patient has received an after-visit summary and questions were answered concerning future plans.  I have discussed medication side effects and warnings with the patient as well. I have reviewed the plan of care with the patient, accepted their input and they are in agreement with the treatment goals.     Reviewed plan of care. Patient has provided input and agrees with goals.    Follow-up Disposition: Not on File    Jenene Slicker, MD

## 2016-07-17 ENCOUNTER — Encounter

## 2016-07-17 MED ORDER — METOPROLOL SUCCINATE SR 100 MG 24 HR TAB
100 mg | ORAL_TABLET | ORAL | 0 refills | Status: DC
Start: 2016-07-17 — End: 2016-09-30

## 2016-07-17 MED ORDER — FUROSEMIDE 40 MG TAB
40 mg | ORAL_TABLET | ORAL | 0 refills | Status: DC
Start: 2016-07-17 — End: 2016-11-11

## 2016-08-19 DIAGNOSIS — M533 Sacrococcygeal disorders, not elsewhere classified: Secondary | ICD-10-CM | POA: Insufficient documentation

## 2016-08-29 NOTE — Telephone Encounter (Signed)
I have run out of refills that were written under a different provider for Lyrica and Duloxetine. ??Would you please continue these medications as my primary care provider? I am no longer under previous doctor's care since making the permanent move back to [place].     Lyrica 100MG  - 1 cap. 3 x a day   Duloxetine DR 60 MG - 1 capsule every day     ThanksLisbeth Ply, Mazzy Ringstad   BD = 07/29/1955   NO = [phone]

## 2016-09-03 ENCOUNTER — Encounter: Attending: Internal Medicine | Primary: Internal Medicine

## 2016-09-03 NOTE — Telephone Encounter (Signed)
I called and spoke with patient, per Dr. Danella Pentoneshmukh- he will not prescribe lyrica as she needs to be seen in office since he's never prescribed it to her before. She states she takes it for fibromyalgia and if she's out of town she will go through withdrawal symptoms. I asked if she could come to the office now but she said she needs to pack her clothes to go to [place] to see her sick father in law. She states she will try to make it to her appnt on 09/09/16. Closing encounter.

## 2016-09-03 NOTE — Telephone Encounter (Signed)
Patient has an appt to be seen today but she just received a call that her father in law may be passing soon and she needs to head to [place] today. Patient is coming to get med refill on Lyrica. Patient would like to know if she can get a med refill before she leaves town.

## 2016-09-09 ENCOUNTER — Encounter: Attending: Internal Medicine | Primary: Internal Medicine

## 2016-09-25 DIAGNOSIS — Z79891 Long term (current) use of opiate analgesic: Secondary | ICD-10-CM | POA: Insufficient documentation

## 2016-09-29 ENCOUNTER — Ambulatory Visit: Admit: 2016-09-29 | Discharge: 2016-09-29 | Attending: Internal Medicine | Primary: Internal Medicine

## 2016-09-29 NOTE — Progress Notes (Signed)
No show.

## 2016-09-30 ENCOUNTER — Inpatient Hospital Stay: Admit: 2016-09-30 | Payer: PRIVATE HEALTH INSURANCE | Primary: Internal Medicine

## 2016-09-30 ENCOUNTER — Ambulatory Visit
Admit: 2016-09-30 | Discharge: 2016-09-30 | Payer: PRIVATE HEALTH INSURANCE | Attending: Internal Medicine | Primary: Internal Medicine

## 2016-09-30 DIAGNOSIS — M818 Other osteoporosis without current pathological fracture: Secondary | ICD-10-CM

## 2016-09-30 DIAGNOSIS — R748 Abnormal levels of other serum enzymes: Secondary | ICD-10-CM

## 2016-09-30 DIAGNOSIS — M810 Age-related osteoporosis without current pathological fracture: Secondary | ICD-10-CM | POA: Insufficient documentation

## 2016-09-30 MED ORDER — ALENDRONATE 70 MG TAB
70 mg | ORAL_TABLET | ORAL | 3 refills | Status: DC
Start: 2016-09-30 — End: 2017-01-22

## 2016-09-30 NOTE — Progress Notes (Signed)
1. Have you been to the ER, urgent care clinic since your last visit?  Hospitalized since your last visit?No    2. Have you seen or consulted any other health care providers outside of the [facility] since your last visit?  lude any pap smears or colon screening. No     Pt states she's been pouring sweat for about 3 weeks when she does something active and she feels nauseous. She states she has decreased appetite.

## 2016-09-30 NOTE — Progress Notes (Signed)
Bridget Morton is a 61 y.o.  female and presents with     Chief Complaint   Patient presents with   ??? Headache   ??? Nausea   ??? Weight Loss   ??? Fatigue   ??? Delusional       Pt has nausea, headaches, symptoms of GERD , occasional diffculy swallowing  Liquids.  Delusions of parasites, not sleeping well.  Pt does not think she snores.  Pt feels like she does not get good sleep at night.  Pt saw Psych,. PSych wants to r/o organic brain lesion to explain her delusions.  Pt has lost  And also had mild nausea.  She feels fatigued.  She does not smoke.  She does have occasional difficulty drinking liquids.      Past Medical History:   Diagnosis Date   ??? Arthritis    ??? Chronic pain    ??? H/O colonoscopy 2010    due 2020   ??? Menopause      Past Surgical History:   Procedure Laterality Date   ??? HX APPENDECTOMY     ??? HX BREAST BIOPSY Left     Benign   ??? HX CESAREAN SECTION  1978, 1982   ??? HX GASTRIC BYPASS  2004   ??? HX GYN     ??? HX KNEE REPLACEMENT      bilateral   ??? HX MOHS PROCEDURES      x 3 on right   ??? HX ORTHOPAEDIC      L4-L5 fusion   ??? HX PARTIAL HYSTERECTOMY  2000    supracervical   ??? HX TONSIL AND ADENOIDECTOMY      childhood     Current Outpatient Prescriptions   Medication Sig   ??? alendronate (FOSAMAX) 70 mg tablet Take 1 Tab by mouth every seven (7) days.   ??? furosemide (LASIX) 40 mg tablet TAKE 1 TABLET BY MOUTH DAILY   ??? amoxicillin (AMOXIL) 500 mg capsule Take 4 capsules one hour  Before dental procedure   ??? metoprolol succinate (TOPROL-XL) 100 mg tablet Take 1 Tab by mouth daily.   ??? QUEtiapine (SEROQUEL) 200 mg tablet Take 400 mg by mouth daily.   ??? triamcinolone acetonide (ORALONE) 0.1 % dental paste Apply to the mucosa of the mouth twice daily   ??? tapentadol (NUCYNTA) 50 mg tablet Take 50 mg by mouth every four (4) hours as needed for Pain.   ??? pantoprazole (PROTONIX) 40 mg tablet TAKE 1 TABLET BY MOUTH DAILY   ??? potassium chloride (KAON 10%) 20 mEq/15 mL solution Take 5 mL by mouth daily.    ??? linaclotide (LINZESS) 145 mcg cap capsule Take  by mouth daily as needed.   ??? celecoxib (CELEBREX) 200 mg capsule Take 200 mg by mouth daily.   ??? montelukast (SINGULAIR) 10 mg tablet Take 10 mg by mouth daily.   ??? multivitamin (ONE A DAY) tablet Take 1 Tab by mouth daily.   ??? calcium citrate-vitamin D3 (CITRACAL WITH VITAMIN D MAXIMUM) tablet Take 2 Tabs by mouth two (2) times a day.   ??? lidocaine (XYLOCAINE) 2 % solution Take 15 mL by mouth as needed for Pain.   ??? tolterodine ER (DETROL LA) 4 mg ER capsule TAKE 1 CAPSULE BY MOUTH DAILY   ??? pregabalin (LYRICA) 100 mg capsule TAKE ONE CAPSULE BY MOUTH EVERY MORNING AND 2 CAPSULES BY MOUTH EVERY EVENING     No current facility-administered medications for this visit.  Health Maintenance   Topic Date Due   ??? DTaP/Tdap/Td series (1 - Tdap) 10/14/1977   ??? ZOSTER VACCINE AGE 91>  08/14/2016   ??? Influenza Age 64 to Adult  11/26/2016   ??? BREAST CANCER SCRN MAMMOGRAM  06/04/2018   ??? COLONOSCOPY  07/14/2018   ??? Hepatitis C Screening  Completed     Immunization History   Administered Date(s) Administered   ??? Influenza Vaccine 02/17/2012, 02/10/2013, 01/17/2014   ??? Influenza Vaccine (Quad) PF 04/09/2016     No LMP recorded. Patient has had a hysterectomy.        Allergies and Intolerances:   Allergies   Allergen Reactions   ??? Opana Er [Oxymorphone] Other (comments)     delirium       Family History:   Family History   Problem Relation Age of Onset   ??? Alcohol abuse Mother    ??? Cancer Mother      breast   ??? Cancer Father      lung   ??? Diabetes Father    ??? Heart Disease Father    ??? Lung Disease Father    ??? Hypertension Father    ??? Cancer Brother      lung       Social History:   She  reports that she quit smoking about 5 years ago. Her smoking use included Cigarettes. She quit after 10.00 years of use. She has never used smokeless tobacco.  She  reports that she drinks alcohol.            Review of Systems: pos for nausea   General: negative for - chills, pos for fatigue and weight change  Psych: negative for - anxiety, depression, irritability or mood swings  ENT: negative for - headaches, hearing change, nasal congestion, oral lesions, sneezing or sore throat  Heme/ Lymph: negative for - bleeding problems, bruising, pallor or swollen lymph nodes  Endo: negative for - hot flashes, polydipsia/polyuria or temperature intolerance  Resp: negative for - cough, shortness of breath or wheezing  CV: negative for - chest pain, edema or palpitations  GI: negative for - abdominal pain, change in bowel habits, constipation, diarrhea or nausea/vomiting  GU: negative for - dysuria, hematuria, incontinence, pelvic pain or vulvar/vaginal symptoms  MSK: negative for - joint pain, joint swelling or muscle pain  Neuro: negative for - confusion, headaches, seizures or weakness  Derm: negative for - dry skin, hair changes, rash or skin lesion changes          Physical:   Vitals:   Vitals:    09/30/16 1052 09/30/16 1057   BP: 141/68 135/63   Pulse: 78    Resp: 16    Temp: 97.8 ??F (36.6 ??C)    TempSrc: Oral    SpO2: 98%    Weight: 229 lb (103.9 kg)    Height: _0  (1.549 m)            Exam:   HEENT- atraumatic,normocephalic, awake, oriented, well nourished  Neck - supple,no enlarged lymph nodes, no JVD, no thyromegaly  Chest- CTA, no rhonchi, no crackles  Heart- rrr, no murmurs / gallop/rub  Abdomen- soft,BS+,NT, no hepatosplenomegaly  Ext - no c/c/edema   Neuro- no focal deficits.Power 5/5 all extremities  Skin - warm,dry, no obvious rashes.          Review of Data:   LABS:   Lab Results   Component Value Date/Time    WBC 3.6 (L) 05/20/2016 01:47  PM    HGB 13.3 05/20/2016 01:47 PM    HCT 41.3 05/20/2016 01:47 PM    PLATELET 181 05/20/2016 01:47 PM     Lab Results   Component Value Date/Time    Sodium 142 05/20/2016 01:47 PM    Potassium 4.2 05/20/2016 01:47 PM    Chloride 100 05/20/2016 01:47 PM    CO2 32 05/20/2016 01:47 PM     Glucose 93 05/20/2016 01:47 PM    BUN 15 05/20/2016 01:47 PM    Creatinine 0.85 05/20/2016 01:47 PM     Lab Results   Component Value Date/Time    Cholesterol, total 179 05/20/2016 01:47 PM    HDL Cholesterol 76 (H) 05/20/2016 01:47 PM    LDL, calculated 83 05/20/2016 01:47 PM    Triglyceride 100 05/20/2016 01:47 PM     No results found for: GPT        Impression / Plan:        ICD-10-CM ICD-9-CM    1. Other osteoporosis, unspecified pathological fracture presence M81.8 733.09 alendronate (FOSAMAX) 70 mg tablet   2. Gastroesophageal reflux disease without esophagitis K21.9 530.81 REFERRAL TO GASTROENTEROLOGY   3. Other dysphagia R13.19 787.29 REFERRAL TO GASTROENTEROLOGY   4. Nausea R11.0 787.02    5. Weight loss R63.4 783.21 HEMOGLOBIN A1C WITH EAG   6. IFG (impaired fasting glucose) R73.01 790.21 HEMOGLOBIN A1C WITH EAG   7. Other headache syndrome G44.89 339.89 CT HEAD W WO CONT   8. Fatigue, unspecified type R53.83 780.79    9. Elevated alkaline phosphatase level R74.8 790.5 ALK PHOS ISOENZYMES   10. Delusions of parasitosis ([place]) F22 300.29 CT HEAD W WO CONT   11. Snoring R06.83 786.09 REFERRAL TO SLEEP STUDIES     Asked pt to take fosamax with plenty of water.    May need upper and lower GI work up.    Explained to patient risk benefits of the medications.Advised patient to stop meds if having any side effects.Pt verbalized understanding of the instructions.    I have discussed the diagnosis with the patient and the intended plan as seen in the above orders.  The patient has received an after-visit summary and questions were answered concerning future plans.  I have discussed medication side effects and warnings with the patient as well. I have reviewed the plan of care with the patient, accepted their input and they are in agreement with the treatment goals.     Reviewed plan of care. Patient has provided input and agrees with goals.    Follow-up Disposition: Not on File    Elnora Morrison, MD

## 2016-10-01 LAB — HEMOGLOBIN A1C WITH EAG
Est. average glucose: 103 mg/dL
Hemoglobin A1c: 5.2 % (ref 4.2–5.6)

## 2016-10-02 LAB — ALK PHOS ISOENZYMES
Alkaline phosphatase: 172 IU/L — ABNORMAL HIGH (ref 39–117)
Bone fraction: 37 % (ref 14–68)
Intestinal fraction: 1 % (ref 0–18)
Liver fraction: 62 % (ref 18–85)

## 2016-10-08 NOTE — Telephone Encounter (Signed)
Bridget DuckingMichael W- on behalf of [employer] called in regards to CT head asking if patient can only do CT without contrast. Per Dr. Danella Pentoneshmukh, it is better with contrast but is okay to do it without.       [id number]  10/06/17 -01/04/17     auth number:   [id number]

## 2016-10-13 ENCOUNTER — Ambulatory Visit: Payer: PRIVATE HEALTH INSURANCE | Primary: Internal Medicine

## 2016-10-15 NOTE — Telephone Encounter (Signed)
Insurance will pay for CT without contrast. Please put in order for CT WITHOUT CONTRAST.

## 2016-10-16 ENCOUNTER — Encounter

## 2016-10-16 NOTE — Telephone Encounter (Signed)
Order completed. This encounter will be closed.

## 2016-10-18 ENCOUNTER — Inpatient Hospital Stay: Admit: 2016-10-18 | Payer: PRIVATE HEALTH INSURANCE | Attending: Internal Medicine | Primary: Internal Medicine

## 2016-10-18 DIAGNOSIS — F22 Delusional disorders: Secondary | ICD-10-CM

## 2016-10-22 NOTE — Telephone Encounter (Signed)
Faxed to [phone].

## 2016-10-22 NOTE — Telephone Encounter (Signed)
Dr. Daphine DeutscherMartin, [facility]    [address], [place], [place] [id number]

## 2016-10-30 ENCOUNTER — Ambulatory Visit
Admit: 2016-10-30 | Discharge: 2016-10-30 | Payer: PRIVATE HEALTH INSURANCE | Attending: Internal Medicine | Primary: Internal Medicine

## 2016-10-30 NOTE — Progress Notes (Signed)
Pt no show

## 2016-11-04 ENCOUNTER — Ambulatory Visit: Admit: 2016-11-04 | Discharge: 2016-11-04 | Attending: Internal Medicine | Primary: Internal Medicine

## 2016-11-04 ENCOUNTER — Inpatient Hospital Stay: Admit: 2016-11-04 | Payer: PRIVATE HEALTH INSURANCE | Primary: Internal Medicine

## 2016-11-04 DIAGNOSIS — R0609 Other forms of dyspnea: Secondary | ICD-10-CM

## 2016-11-04 DIAGNOSIS — R748 Abnormal levels of other serum enzymes: Secondary | ICD-10-CM

## 2016-11-04 NOTE — Progress Notes (Signed)
1. Have you been to the ER, urgent care clinic since your last visit?  Hospitalized since your last visit?No    2. Have you seen or consulted any other health care providers outside of the [facility] since your last visit?  Include any pap smears or colon screening. psychitrist

## 2016-11-04 NOTE — Progress Notes (Signed)
Bridget SparrowJanet Mize Morton is a 61 y.o.  female and presents with     Chief Complaint   Patient presents with   ??? Excessive Sweating   ??? Mental Health Problem   ??? Leg Swelling   ??? Breathing Problem   ??? Snoring       Pt has leg sweling.  Pt snores.  Pt does have DOE.  Pt has excessive sweating.  Pt worries a lot.  Pt saw Psych and was started on Latuda.  Pt went to Derm and had biopsy of her skin.Reports are pending.      Past Medical History:   Diagnosis Date   ??? Arthritis    ??? Chronic pain    ??? H/O colonoscopy 2010    due 2020   ??? Menopause      Past Surgical History:   Procedure Laterality Date   ??? HX APPENDECTOMY     ??? HX BREAST BIOPSY Left     Benign   ??? HX CESAREAN SECTION  1978, 1982   ??? HX GASTRIC BYPASS  2004   ??? HX GYN     ??? HX KNEE REPLACEMENT      bilateral   ??? HX MOHS PROCEDURES      x 3 on right   ??? HX ORTHOPAEDIC      L4-L5 fusion   ??? HX PARTIAL HYSTERECTOMY  2000    supracervical   ??? HX TONSIL AND ADENOIDECTOMY      childhood     Current Outpatient Prescriptions   Medication Sig   ??? alendronate (FOSAMAX) 70 mg tablet Take 1 Tab by mouth every seven (7) days.   ??? furosemide (LASIX) 40 mg tablet TAKE 1 TABLET BY MOUTH DAILY   ??? metoprolol succinate (TOPROL-XL) 100 mg tablet Take 1 Tab by mouth daily.   ??? QUEtiapine (SEROQUEL) 200 mg tablet Take 400 mg by mouth daily.   ??? lidocaine (XYLOCAINE) 2 % solution Take 15 mL by mouth as needed for Pain.   ??? triamcinolone acetonide (ORALONE) 0.1 % dental paste Apply to the mucosa of the mouth twice daily   ??? tapentadol (NUCYNTA) 50 mg tablet Take 50 mg by mouth every four (4) hours as needed for Pain.   ??? pantoprazole (PROTONIX) 40 mg tablet TAKE 1 TABLET BY MOUTH DAILY   ??? potassium chloride (KAON 10%) 20 mEq/15 mL solution Take 5 mL by mouth daily.   ??? celecoxib (CELEBREX) 200 mg capsule Take 200 mg by mouth daily.   ??? montelukast (SINGULAIR) 10 mg tablet Take 10 mg by mouth daily.   ??? multivitamin (ONE A DAY) tablet Take 1 Tab by mouth daily.    ??? calcium citrate-vitamin D3 (CITRACAL WITH VITAMIN D MAXIMUM) tablet Take 2 Tabs by mouth two (2) times a day.   ??? amoxicillin (AMOXIL) 500 mg capsule Take 4 capsules one hour  Before dental procedure   ??? tolterodine ER (DETROL LA) 4 mg ER capsule TAKE 1 CAPSULE BY MOUTH DAILY   ??? pregabalin (LYRICA) 100 mg capsule TAKE ONE CAPSULE BY MOUTH EVERY MORNING AND 2 CAPSULES BY MOUTH EVERY EVENING   ??? linaclotide (LINZESS) 145 mcg cap capsule Take  by mouth daily as needed.     No current facility-administered medications for this visit.      Health Maintenance   Topic Date Due   ??? DTaP/Tdap/Td series (1 - Tdap) 10/14/1977   ??? ZOSTER VACCINE AGE 52>  08/14/2016   ??? Influenza Age 51 to  Adult  11/26/2016   ??? BREAST CANCER SCRN MAMMOGRAM  06/04/2018   ??? COLONOSCOPY  07/14/2018   ??? Hepatitis C Screening  Completed     Immunization History   Administered Date(s) Administered   ??? Influenza Vaccine 02/17/2012, 02/10/2013, 01/17/2014   ??? Influenza Vaccine (Quad) PF 04/09/2016     No LMP recorded. Patient has had a hysterectomy.        Allergies and Intolerances:   Allergies   Allergen Reactions   ??? Opana Er [Oxymorphone] Other (comments)     delirium       Family History:   Family History   Problem Relation Age of Onset   ??? Alcohol abuse Mother    ??? Cancer Mother      breast   ??? Cancer Father      lung   ??? Diabetes Father    ??? Heart Disease Father    ??? Lung Disease Father    ??? Hypertension Father    ??? Cancer Brother      lung       Social History:   She  reports that she quit smoking about 5 years ago. Her smoking use included Cigarettes. She quit after 10.00 years of use. She has never used smokeless tobacco.  She  reports that she drinks alcohol.            Review of Systems: pos for leg swelling, sweating, DOE  General: negative for - chills, fatigue, fever, weight change  Psych: negative for - anxiety, depression, irritability or mood swings  ENT: negative for - headaches, hearing change, nasal congestion, oral  lesions, sneezing or sore throat  Heme/ Lymph: negative for - bleeding problems, bruising, pallor or swollen lymph nodes  Endo: negative for - hot flashes, polydipsia/polyuria or temperature intolerance  Resp: negative for - cough, shortness of breath or wheezing  CV: negative for - chest pain, edema or palpitations  GI: negative for - abdominal pain, change in bowel habits, constipation, diarrhea or nausea/vomiting  GU: negative for - dysuria, hematuria, incontinence, pelvic pain or vulvar/vaginal symptoms  MSK: negative for - joint pain, joint swelling or muscle pain  Neuro: negative for - confusion, headaches, seizures or weakness  Derm: negative for - dry skin, hair changes, rash or skin lesion changes          Physical:   Vitals:   Vitals:    11/04/16 0742   BP: 149/84   Pulse: 80   Resp: 16   Temp: 97.8 ??F (36.6 ??C)   TempSrc: Oral   SpO2: 91%   Weight: 228 lb (103.4 kg)   Height: 5\' 1"  (1.549 m)           Exam:   HEENT- atraumatic,normocephalic, awake, oriented, well nourished  Neck - supple,no enlarged lymph nodes, no JVD, no thyromegaly  Chest- CTA, no rhonchi, no crackles  Heart- rrr, no murmurs / gallop/rub  Abdomen- soft,BS+,NT, no hepatosplenomegaly  Ext - + 2 +3 bilateral leg sweling  Neuro- no focal deficits.Power 5/5 all extremities  Skin - warm,dry, no obvious rashes.          Review of Data:   LABS:   Lab Results   Component Value Date/Time    WBC 3.6 (L) 05/20/2016 01:47 PM    HGB 13.3 05/20/2016 01:47 PM    HCT 41.3 05/20/2016 01:47 PM    PLATELET 181 05/20/2016 01:47 PM     Lab Results   Component Value Date/Time  Sodium 142 05/20/2016 01:47 PM    Potassium 4.2 05/20/2016 01:47 PM    Chloride 100 05/20/2016 01:47 PM    CO2 32 05/20/2016 01:47 PM    Glucose 93 05/20/2016 01:47 PM    BUN 15 05/20/2016 01:47 PM    Creatinine 0.85 05/20/2016 01:47 PM     Lab Results   Component Value Date/Time    Cholesterol, total 179 05/20/2016 01:47 PM    HDL Cholesterol 76 (H) 05/20/2016 01:47 PM     LDL, calculated 83 05/20/2016 01:47 PM    Triglyceride 100 05/20/2016 01:47 PM     No results found for: GPT        Impression / Plan:        ICD-10-CM ICD-9-CM    1. DOE (dyspnea on exertion) R06.09 786.09 ECHO ADULT COMPLETE   2. Leg swelling M79.89 729.81 ECHO ADULT COMPLETE      MICROALBUMIN, UR, RAND W/ MICROALB/CREAT RATIO   3. Elevated alkaline phosphatase level R74.8 790.5 [place] ABD COMP      MITOCHONDRIAL M2 AB   4. Snoring R06.83 786.09 REFERRAL TO SLEEP STUDIES   5. Excessive sweating R61 780.8          Explained to patient risk benefits of the medications.Advised patient to stop meds if having any side effects.Pt verbalized understanding of the instructions.    I have discussed the diagnosis with the patient and the intended plan as seen in the above orders.  The patient has received an after-visit summary and questions were answered concerning future plans.  I have discussed medication side effects and warnings with the patient as well. I have reviewed the plan of care with the patient, accepted their input and they are in agreement with the treatment goals.     Reviewed plan of care. Patient has provided input and agrees with goals.    Follow-up Disposition: Not on File    Jenene Slicker, MD

## 2016-11-05 LAB — MICROALBUMIN, UR, RAND W/ MICROALB/CREAT RATIO
Creatinine, urine random: <13 mg/dL — ABNORMAL LOW (ref 30–125)
Microalbumin,urine random: <0.13 MG/DL (ref 0–3.0)

## 2016-11-05 LAB — MITOCHONDRIAL M2 AB: Michochondrial (M2) Ab: 4.6 Units (ref 0.0–20.0)

## 2016-11-11 ENCOUNTER — Encounter

## 2016-11-11 MED ORDER — FUROSEMIDE 40 MG TAB
40 mg | ORAL_TABLET | ORAL | 0 refills | Status: DC
Start: 2016-11-11 — End: 2017-01-22

## 2016-11-20 ENCOUNTER — Encounter

## 2016-11-20 MED ORDER — METOPROLOL SUCCINATE SR 100 MG 24 HR TAB
100 mg | ORAL_TABLET | ORAL | 0 refills | Status: DC
Start: 2016-11-20 — End: 2017-01-08

## 2016-11-27 ENCOUNTER — Inpatient Hospital Stay: Admit: 2016-11-27 | Payer: PRIVATE HEALTH INSURANCE | Attending: Internal Medicine | Primary: Internal Medicine

## 2016-11-27 ENCOUNTER — Encounter

## 2016-11-27 DIAGNOSIS — R0609 Other forms of dyspnea: Secondary | ICD-10-CM

## 2016-11-29 LAB — TRANSTHORACIC ECHOCARDIOGRAM (TTE) COMPLETE (CONTRAST/BUBBLE/3D PRN)
AV Peak Gradient: 0 mmHg
AV Peak Velocity: 0 cm/s
EF BP: 72.3 % (ref 55–100)
Est. RA Pressure: 5 mmHg
IVSd: 0.92 cm — AB (ref 0.6–0.9)
LA Volume 2C: 78.92 mL — AB (ref 22–52)
LA Volume 4C: 56.26 mL — AB (ref 22–52)
LA Volume Index 2C: 39.51 ml/m2
LA Volume Index 4C: 28.17 ml/m2
LV EDV A2C: 98.6 mL
LV EDV A4C: 88 mL
LV EDV BP: 99.4 ml (ref 56–104)
LV EDV Index A2C: 49.4 mL/m2
LV EDV Index A4C: 44.1 mL/m2
LV EDV Index BP: 49.8 mL/m2
LV ESV A2C: 33.7 mL
LV ESV A4C: 21.3 mL
LV ESV BP: 27.6 mL (ref 19–49)
LV ESV Index A2C: 16.9 mL/m2
LV ESV Index A4C: 10.7 mL/m2
LV ESV Index BP: 13.8 mL/m2
LV Ejection Fraction A2C: 66 %
LV Ejection Fraction A4C: 76 %
LV IVRT: 55.5 ms
LV Mass 2D Index: 67.1 g/m2
LV Mass 2D: 134.1 g (ref 67–162)
LVIDd: 4.28 cm (ref 3.9–5.3)
LVIDs: 3.02 cm
LVOT Diameter: 1.89 cm
LVOT Peak Gradient: 3.8 mmHg
LVOT Peak Velocity: 98.05 cm/s
LVPWd: 0.84 cm (ref 0.6–0.9)
Left Ventricular Ejection Fraction: 60
MV A Velocity: 110.62 cm/s
MV Area by PHT: 6.8 cm2
MV E Velocity: 0.86 cm/s
MV E Wave Deceleration Time: 112 ms
MV E/A: 0.01
MV PHT: 32.5 ms
PASP: 15.1 mmHg
Pulm Vein A Duration: 72.1 ms
Pulm Vein A Velocity: 30.55 cm/s
RVSP: 15.1 mmHg
TR Max Velocity: -158.52 cm/s
TR Peak Gradient: 10.1 mmHg

## 2016-11-29 LAB — ECHO ADULT COMPLETE
AV Peak Gradient: 0 mmHg
AV Peak Velocity: 0 cm/s
EF BP: 72.3 % (ref 55–100)
Est. RA Pressure: 5 mmHg
IVSd: 0.92 cm — AB (ref 0.6–0.9)
LA Volume 2C: 78.92 mL — AB (ref 22–52)
LA Volume 4C: 56.26 mL — AB (ref 22–52)
LA Volume Index 2C: 39.51 ml/m2
LA Volume Index 4C: 28.17 ml/m2
LV EDV A2C: 98.6 mL
LV EDV A4C: 88 mL
LV EDV BP: 99.4 ml (ref 56–104)
LV EDV Index A2C: 49.4 mL/m2
LV EDV Index A4C: 44.1 mL/m2
LV EDV Index BP: 49.8 mL/m2
LV ESV A2C: 33.7 mL
LV ESV A4C: 21.3 mL
LV ESV BP: 27.6 mL (ref 19–49)
LV ESV Index A2C: 16.9 mL/m2
LV ESV Index A4C: 10.7 mL/m2
LV ESV Index BP: 13.8 mL/m2
LV Ejection Fraction A2C: 66 %
LV Ejection Fraction A4C: 76 %
LV IVRT: 55.5 ms
LV Mass 2D Index: 67.1 g/m2
LV Mass 2D: 134.1 g (ref 67–162)
LVIDd: 4.28 cm (ref 3.9–5.3)
LVIDs: 3.02 cm
LVOT Diameter: 1.89 cm
LVOT Peak Gradient: 3.8 mmHg
LVOT Peak Velocity: 98.05 cm/s
LVPWd: 0.84 cm (ref 0.6–0.9)
MV A Velocity: 110.62 cm/s
MV Area by PHT: 6.8 cm2
MV E Velocity: 0.86 cm/s
MV E Wave Deceleration Time: 112 ms
MV E/A: 0.01
MV PHT: 32.5 ms
PASP: 15.1 mmHg
Pulm Vein A Duration: 72.1 ms
Pulm Vein A Velocity: 30.55 cm/s
RVSP: 15.1 mmHg
TR Max Velocity: -158.52 cm/s
TR Peak Gradient: 10.1 mmHg

## 2016-12-08 ENCOUNTER — Ambulatory Visit: Admit: 2016-12-08 | Attending: Internal Medicine | Primary: Internal Medicine

## 2016-12-08 DIAGNOSIS — K821 Hydrops of gallbladder: Secondary | ICD-10-CM

## 2016-12-08 DIAGNOSIS — K760 Fatty (change of) liver, not elsewhere classified: Secondary | ICD-10-CM | POA: Insufficient documentation

## 2016-12-08 MED ORDER — CELECOXIB 200 MG CAP
200 mg | ORAL_CAPSULE | Freq: Every day | ORAL | 2 refills | Status: DC
Start: 2016-12-08 — End: 2017-01-22

## 2016-12-08 MED ORDER — LEVOTHYROXINE 25 MCG TAB
25 mcg | ORAL_TABLET | Freq: Every day | ORAL | 3 refills | Status: DC
Start: 2016-12-08 — End: 2017-04-06

## 2016-12-08 NOTE — Progress Notes (Signed)
1. Have you been to the ER, urgent care clinic since your last visit?  Hospitalized since your last visit?No    2. Have you seen or consulted any other health care providers outside of the [facility] since your last visit?  Include any pap smears or colon screening. No

## 2016-12-08 NOTE — Progress Notes (Signed)
Bridget Morton is a 61 y.o.  female and presents with     Chief Complaint   Patient presents with   ??? Hypothyroidism   ??? Excessive Sweating   ??? Joint Pain   ??? Fatigue   ??? Anxiety       Pt is here to go over lab results, ECHO and abdominal ultrasound.  Pt saw Derm  For symptoms of parasites on her skin.  Pt says she does not feel that she has bugs anymore. She thinks it was anxiety.  She had ECHO for DOE and leg swelling- she says her symptoms have resolve.d  She has TSh done ordered Derm and TSH was slightly high. Pt sweats escessively all the time and was told that ot could be her thyroid.also feels fatigued  Pt has jont pains nad needs refills on celebrex.  She had ultrasound showed hydrops . She does have mild RUQ and epigastric pain .      Past Medical History:   Diagnosis Date   ??? Arthritis    ??? Chronic pain    ??? H/O colonoscopy 2010    due 2020   ??? Menopause      Past Surgical History:   Procedure Laterality Date   ??? HX APPENDECTOMY     ??? HX BREAST BIOPSY Left     Benign   ??? HX CESAREAN SECTION  1978, 1982   ??? HX GASTRIC BYPASS  2004   ??? HX GYN     ??? HX KNEE REPLACEMENT      bilateral   ??? HX MOHS PROCEDURES      x 3 on right   ??? HX ORTHOPAEDIC      L4-L5 fusion   ??? HX PARTIAL HYSTERECTOMY  2000    supracervical   ??? HX TONSIL AND ADENOIDECTOMY      childhood     Current Outpatient Prescriptions   Medication Sig   ??? celecoxib (CELEBREX) 200 mg capsule Take 1 Cap by mouth daily.   ??? levothyroxine (SYNTHROID) 25 mcg tablet Take 1 Tab by mouth Daily (before breakfast).   ??? metoprolol succinate (TOPROL-XL) 100 mg tablet TAKE 1 TABLET BY MOUTH DAILY   ??? furosemide (LASIX) 40 mg tablet TAKE 1 TABLET BY MOUTH DAILY   ??? alendronate (FOSAMAX) 70 mg tablet Take 1 Tab by mouth every seven (7) days.   ??? QUEtiapine (SEROQUEL) 200 mg tablet Take 400 mg by mouth daily.   ??? lidocaine (XYLOCAINE) 2 % solution Take 15 mL by mouth as needed for Pain.   ??? triamcinolone acetonide (ORALONE) 0.1 % dental paste Apply to the mucosa  of the mouth twice daily   ??? pantoprazole (PROTONIX) 40 mg tablet TAKE 1 TABLET BY MOUTH DAILY   ??? montelukast (SINGULAIR) 10 mg tablet Take 10 mg by mouth daily.   ??? multivitamin (ONE A DAY) tablet Take 1 Tab by mouth daily.   ??? calcium citrate-vitamin D3 (CITRACAL WITH VITAMIN D MAXIMUM) tablet Take 2 Tabs by mouth two (2) times a day.   ??? amoxicillin (AMOXIL) 500 mg capsule Take 4 capsules one hour  Before dental procedure   ??? tapentadol (NUCYNTA) 50 mg tablet Take 50 mg by mouth every four (4) hours as needed for Pain.   ??? tolterodine ER (DETROL LA) 4 mg ER capsule TAKE 1 CAPSULE BY MOUTH DAILY   ??? pregabalin (LYRICA) 100 mg capsule TAKE ONE CAPSULE BY MOUTH EVERY MORNING AND 2 CAPSULES BY MOUTH EVERY EVENING   ???  potassium chloride (KAON 10%) 20 mEq/15 mL solution Take 5 mL by mouth daily.   ??? linaclotide (LINZESS) 145 mcg cap capsule Take  by mouth daily as needed.     No current facility-administered medications for this visit.      Health Maintenance   Topic Date Due   ??? DTaP/Tdap/Td series (1 - Tdap) 10/14/1977   ??? ZOSTER VACCINE AGE 13>  08/14/2016   ??? Influenza Age 78 to Adult  11/26/2016   ??? BREAST CANCER SCRN MAMMOGRAM  06/04/2018   ??? COLONOSCOPY  07/14/2018   ??? Hepatitis C Screening  Completed     Immunization History   Administered Date(s) Administered   ??? Influenza Vaccine 02/17/2012, 02/10/2013, 01/17/2014   ??? Influenza Vaccine (Quad) PF 04/09/2016     No LMP recorded. Patient has had a hysterectomy.        Allergies and Intolerances:   Allergies   Allergen Reactions   ??? Opana Er [Oxymorphone] Other (comments)     delirium       Family History:   Family History   Problem Relation Age of Onset   ??? Alcohol abuse Mother    ??? Cancer Mother      breast   ??? Cancer Father      lung   ??? Diabetes Father    ??? Heart Disease Father    ??? Lung Disease Father    ??? Hypertension Father    ??? Cancer Brother      lung       Social History:   She  reports that she quit smoking about 5 years ago. Her smoking use  included Cigarettes. She quit after 10.00 years of use. She has never used smokeless tobacco.  She  reports that she drinks alcohol.            Review of Systems: pos for excessive sweating  General: negative for - chills, pos for  fatigue,  Psych: negative for - anxiety, depression, irritability or mood swings  ENT: negative for - headaches, hearing change, nasal congestion, oral lesions, sneezing or sore throat  Heme/ Lymph: negative for - bleeding problems, bruising, pallor or swollen lymph nodes  Endo: negative for - hot flashes, polydipsia/polyuria or temperature intolerance  Resp: negative for - cough, shortness of breath or wheezing  CV: negative for - chest pain, edema or palpitations  GI: negative for - abdominal pain, change in bowel habits, constipation, diarrhea or nausea/vomiting  GU: negative for - dysuria, hematuria, incontinence, pelvic pain or vulvar/vaginal symptoms  MSK: negative for - joint pain, joint swelling or muscle pain, pos for joint pain  Neuro: negative for - confusion, headaches, seizures or weakness  Derm: negative for - dry skin, hair changes, rash or skin lesion changes          Physical:   Vitals:   Vitals:    12/08/16 1608   BP: 117/68   Pulse: 70   Resp: 17   Temp: 97.5 ??F (36.4 ??C)   TempSrc: Oral   SpO2: 95%   Weight: 223 lb (101.2 kg)   Height: 5\' 1"  (1.549 m)           Exam:   HEENT- atraumatic,normocephalic, awake, oriented, well nourished  Neck - supple,no enlarged lymph nodes, no JVD, no thyromegaly  Chest- CTA, no rhonchi, no crackles  Heart- rrr, no murmurs / gallop/rub  Abdomen- soft,BS+,NT, no hepatosplenomegaly, mild RUQ and epigastric tenderness  Ext - no c/c/edema   Neuro- no  focal deficits.Power 5/5 all extremities  Skin - warm,dry, no obvious rashes.          Review of Data:   LABS:   Lab Results   Component Value Date/Time    WBC 3.6 (L) 05/20/2016 01:47 PM    HGB 13.3 05/20/2016 01:47 PM    HCT 41.3 05/20/2016 01:47 PM    PLATELET 181 05/20/2016 01:47 PM      Lab Results   Component Value Date/Time    Sodium 142 05/20/2016 01:47 PM    Potassium 4.2 05/20/2016 01:47 PM    Chloride 100 05/20/2016 01:47 PM    CO2 32 05/20/2016 01:47 PM    Glucose 93 05/20/2016 01:47 PM    BUN 15 05/20/2016 01:47 PM    Creatinine 0.85 05/20/2016 01:47 PM     Lab Results   Component Value Date/Time    Cholesterol, total 179 05/20/2016 01:47 PM    HDL Cholesterol 76 (H) 05/20/2016 01:47 PM    LDL, calculated 83 05/20/2016 01:47 PM    Triglyceride 100 05/20/2016 01:47 PM     No results found for: GPT        Impression / Plan:        ICD-10-CM ICD-9-CM    1. Hydrops of gallbladder K82.1 575.3 REFERRAL TO GENERAL SURGERY   2. Hypothyroidism, unspecified type E03.9 244.9    3. Elevated TSH R94.6 794.5 levothyroxine (SYNTHROID) 25 mcg tablet   4. Excessive sweating R61 780.8    5. Arthralgia, unspecified joint M25.50 719.40 celecoxib (CELEBREX) 200 mg capsule   6. Fatty liver K76.0 571.8      DOE - resolved    Leg swelling - improved    Delusions of parasitosis - resolved.    reassurred pt that her cardiac function is good.      Spent over 30 minutes with pt face to face, over half the time counselling.    Explained to patient risk benefits of the medications.Advised patient to stop meds if having any side effects.Pt verbalized understanding of the instructions.    I have discussed the diagnosis with the patient and the intended plan as seen in the above orders.  The patient has received an after-visit summary and questions were answered concerning future plans.  I have discussed medication side effects and warnings with the patient as well. I have reviewed the plan of care with the patient, accepted their input and they are in agreement with the treatment goals.     Reviewed plan of care. Patient has provided input and agrees with goals.    Follow-up Disposition: Not on File    Jenene Slicker, MD

## 2016-12-15 ENCOUNTER — Ambulatory Visit
Admit: 2016-12-15 | Discharge: 2016-12-15 | Payer: PRIVATE HEALTH INSURANCE | Attending: Surgery | Primary: Internal Medicine

## 2016-12-15 DIAGNOSIS — K821 Hydrops of gallbladder: Secondary | ICD-10-CM

## 2016-12-15 NOTE — Progress Notes (Signed)
Patient presents today due to gallbladder issues.

## 2016-12-15 NOTE — Progress Notes (Signed)
General Surgery Consult      Bridget Morton  Admit date: (Not on file)    MRN: [id number]     DOB: Feb 04, 1956     Age: 61 y.o.        Attending Physician: Earlyne Iba, MD, FACS      Subjective:     Bridget Morton is a 61 y.o. female with a history of abdominal pain.  The patient pain has been there for about 2 months.  She said that the pain is localized in the right upper quadrant area.  She said that the pain is postprandial and is clearly worse after eating fatty foods.  The patient had a history of laparoscopic gastric bypass in the past but she is still morbidly obese.  The patient had 2 C-section in the past and open appendectomy.  She had an ultrasound that showed no cholelithiasis but hydrops of the gallbladder.  The patient labs also showed some mild elevation of her alkaline phosphatase, but other liver function tests are normal. The patient  has not had jaundice, acholic stools or dark urine and has not had a history of pancreatitis or hepatitis.  She did not had a CT scan of abdomen and pelvis.  The patient uses a wheelchair because of her back pain .     Patient Active Problem List    Diagnosis Date Noted   ??? Fatty liver 12/08/2016   ??? Osteoporosis 09/30/2016   ??? Delusions of parasitosis ([place]) 05/27/2016   ??? Essential hypertension 04/09/2016   ??? Leg swelling 04/09/2016   ??? Obesity, morbid ([place]) 03/24/2016   ??? Chiari malformation type I ([place]) 03/24/2016   ??? H/O: hysterectomy 03/24/2016   ??? Chronic midline low back pain 03/28/2015   ??? 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 Primary osteoarthritis involving multiple joints 05/08/2014   ??? Lumbar post-laminectomy syndrome 05/08/2014   ??? Cervical radiculopathy 05/08/2014   ??? Lumbar radiculopathy 05/08/2014   ??? Non-restorative sleep 05/08/2014   ??? Fatigue 05/08/2014   ??? H/O gastric bypass 03/03/2014   ??? Depression 10/20/2013   ??? Dysphagia, oropharyngeal phase 05/18/2013   ??? Chronic constipation 07/26/2012   ??? Dry eyes, bilateral 07/26/2012   ??? Leg cramps 62/94/7654    ??? Diastolic dysfunction 65/06/5463   ??? Daytime somnolence 06/02/2012   ??? Insomnia 05/11/2012   ??? Iron deficiency 01/14/2012   ??? Fibromyalgia 01/14/2012   ??? Internal hemorrhoid 12/16/2011   ??? OA (osteoarthritis) 12/16/2011   ??? Edema 12/16/2011   ??? 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 Chronic pain 12/16/2011     Past Medical History:   Diagnosis Date   ??? Arthritis    ??? Chronic pain    ??? H/O colonoscopy 2010    due 2020   ??? Menopause       Past Surgical History:   Procedure Laterality Date   ??? HX APPENDECTOMY     ??? HX BREAST BIOPSY Left     Benign   ??? HX CESAREAN SECTION  1978, 1982   ??? HX GASTRIC BYPASS  2004   ??? HX GYN     ??? HX KNEE REPLACEMENT      bilateral   ??? HX MOHS PROCEDURES      x 3 on right   ??? HX ORTHOPAEDIC      L4-L5 fusion   ??? HX PARTIAL HYSTERECTOMY  2000    supracervical   ??? HX TONSIL AND ADENOIDECTOMY      childhood  Social History   Substance Use Topics   ??? Smoking status: Former Smoker     Years: 10.00     Types: Cigarettes     Quit date: 12/16/2010   ??? Smokeless tobacco: Never Used   ??? Alcohol use Yes      Comment: seldom      History   Smoking Status   ??? Former Smoker   ??? Years: 10.00   ??? Types: Cigarettes   ??? Quit date: 12/16/2010   Smokeless Tobacco   ??? Never Used     Family History   Problem Relation Age of Onset   ??? Alcohol abuse Mother    ??? Cancer Mother      breast   ??? Cancer Father      lung   ??? Diabetes Father    ??? Heart Disease Father    ??? Lung Disease Father    ??? Hypertension Father    ??? Cancer Brother      lung      Current Outpatient Prescriptions   Medication Sig   ??? celecoxib (CELEBREX) 200 mg capsule Take 1 Cap by mouth daily.   ??? levothyroxine (SYNTHROID) 25 mcg tablet Take 1 Tab by mouth Daily (before breakfast).   ??? metoprolol succinate (TOPROL-XL) 100 mg tablet TAKE 1 TABLET BY MOUTH DAILY   ??? furosemide (LASIX) 40 mg tablet TAKE 1 TABLET BY MOUTH DAILY   ??? alendronate (FOSAMAX) 70 mg tablet Take 1 Tab by mouth every seven (7) days.    ??? amoxicillin (AMOXIL) 500 mg capsule Take 4 capsules one hour  Before dental procedure   ??? QUEtiapine (SEROQUEL) 200 mg tablet Take 400 mg by mouth daily.   ??? lidocaine (XYLOCAINE) 2 % solution Take 15 mL by mouth as needed for Pain.   ??? triamcinolone acetonide (ORALONE) 0.1 % dental paste Apply to the mucosa of the mouth twice daily   ??? tapentadol (NUCYNTA) 50 mg tablet Take 50 mg by mouth every four (4) hours as needed for Pain.   ??? pantoprazole (PROTONIX) 40 mg tablet TAKE 1 TABLET BY MOUTH DAILY   ??? tolterodine ER (DETROL LA) 4 mg ER capsule TAKE 1 CAPSULE BY MOUTH DAILY   ??? pregabalin (LYRICA) 100 mg capsule TAKE ONE CAPSULE BY MOUTH EVERY MORNING AND 2 CAPSULES BY MOUTH EVERY EVENING   ??? potassium chloride (KAON 10%) 20 mEq/15 mL solution Take 5 mL by mouth daily.   ??? linaclotide (LINZESS) 145 mcg cap capsule Take  by mouth daily as needed.   ??? montelukast (SINGULAIR) 10 mg tablet Take 10 mg by mouth daily.   ??? multivitamin (ONE A DAY) tablet Take 1 Tab by mouth daily.   ??? calcium citrate-vitamin D3 (CITRACAL WITH VITAMIN D MAXIMUM) tablet Take 2 Tabs by mouth two (2) times a day.     No current facility-administered medications for this visit.       Allergies   Allergen Reactions   ??? Opana Er [Oxymorphone] Other (comments)     delirium        Review of Systems:  Constitutional: negative  Eyes: negative  Ears, Nose, Mouth, Throat, and Face: negative  Respiratory: negative  Cardiovascular: negative  Gastrointestinal: positive for abdominal pain  Genitourinary:negative  Integument/Breast: negative  Hematologic/Lymphatic: negative  Musculoskeletal:positive for back pain  Neurological: negative  Behavioral/Psychiatric: negative  Endocrine: negative  Allergic/Immunologic: negative    Objective:     Visit Vitals   ??? BP 129/75 (BP 1 Location: Right  arm, BP Patient Position: Sitting)   ??? Pulse 66   ??? Temp 97.5 ??F (36.4 ??C) (Oral)   ??? Resp 18   ??? Ht 5' 1" (1.549 m)   ??? Wt 101.1 kg (222 lb 12.8 oz)   ??? SpO2 95%    ??? BMI 42.1 kg/m2       Physical Exam:      General:  in no apparent distress, oriented times 3, afebrile, anicteric and cooperative   Eyes:  conjunctivae and sclerae normal, pupils equal, round, reactive to light   Throat & Neck: no erythema or exudates noted and neck supple and symmetrical; no palpable masses   Lungs:   clear to auscultation bilaterally   Heart:  Regular rate and rhythm   Abdomen:   rounded and obese, soft, non tender except for localized right upper quadrant tenderness, distended, no masses or organomegaly.  No evidence of abdominal wall hernias.    Extremities: extremities normal, atraumatic, no cyanosis or edema   Skin: Normal.         Imaging and Lab Review:     CBC:   Lab Results   Component Value Date/Time    WBC 3.6 (L) 05/20/2016 01:47 PM    RBC 5.18 05/20/2016 01:47 PM    HGB 13.3 05/20/2016 01:47 PM    HCT 41.3 05/20/2016 01:47 PM    PLATELET 181 05/20/2016 01:47 PM     BMP:   Lab Results   Component Value Date/Time    Glucose 93 05/20/2016 01:47 PM    Sodium 142 05/20/2016 01:47 PM    Potassium 4.2 05/20/2016 01:47 PM    Chloride 100 05/20/2016 01:47 PM    CO2 32 05/20/2016 01:47 PM    BUN 15 05/20/2016 01:47 PM    Creatinine 0.85 05/20/2016 01:47 PM    Calcium 8.6 05/20/2016 01:47 PM     CMP:  Lab Results   Component Value Date/Time    Glucose 93 05/20/2016 01:47 PM    Sodium 142 05/20/2016 01:47 PM    Potassium 4.2 05/20/2016 01:47 PM    Chloride 100 05/20/2016 01:47 PM    CO2 32 05/20/2016 01:47 PM    BUN 15 05/20/2016 01:47 PM    Creatinine 0.85 05/20/2016 01:47 PM    Calcium 8.6 05/20/2016 01:47 PM    Anion gap 10 05/20/2016 01:47 PM    BUN/Creatinine ratio 18 05/20/2016 01:47 PM    Alk. phosphatase 199 (H) 05/20/2016 01:47 PM    Protein, total 7.3 05/20/2016 01:47 PM    Albumin 3.8 05/20/2016 01:47 PM    Globulin 3.5 05/20/2016 01:47 PM    A-G Ratio 1.1 05/20/2016 01:47 PM       No results found for this or any previous visit (from the past 24 hour(s)).     images and reports reviewed    Assessment:   Esteen Delpriore is a 61 y.o. female is presenting with abdominal pain.  Her symptoms are consistent with biliary colic but her ultrasound did not show any cholelithiasis.  It could be that she has biliary dyskinesia.  I am little bit concerned about her mild elevation of alkaline phosphatase.  I explained to the patient that I would like to rule out a lesion causing obstruction though is very unlikely. I explained the indications for robotic cholecystectomy as well as the alternatives. I discussed the potential risks, including but not limited to bleeding, wound infection, trocar injuries, bile duct injury and leak, and also the possible  need for conversion to open procedure. I also explained the firefly technology and how it allow [place] to visualize the biliary tree to avoid bile duct injury or leak. She indicates that she understands the risks, accepts and wishes to proceed.  I also explained to the patient and we will do a liver biopsy because of her ultrasound findings.     Plan:     We will order a CT scan of abdomen and pelvis to make sure there is no liver or pancreatic lesion causing the hydrops of the gallbladder  If the CT scan is negative we will proceed with robotic cholecystectomy with firefly (ICG) technology for identification of the biliary tree as well as a liver biopsy.   Consider a GI consult.  Patient will need a colonoscopy anyway.     Please call me if you have any questions (cell phone: [phone])     Signed By: Earlyne Iba, MD     December 15, 2016

## 2016-12-15 NOTE — Patient Instructions (Addendum)
If you have any questions or concerns about today's appointment, the verbal and/or written instructions you were given for follow up care, please call our office at [phone].    [facility]  [address], [address]  [place], [place] [address]    [phone] office  [phone] fax    Appointment: Friday, December 19, 2016 at 1:00pm/check in at 12:45pm for CT Abdomen and Pelvic at [facility]      .Marland KitchenPATIENT PRE AND POST OPERATIVE INSTRUCTIONS     [facility]   [address]  [place], [place] [address]  [phone]    Before Surgery Instructions:   1) You must have someone available to drive you to and from your procedure and stay with you for the first 24 hours.  2) It is very important that you have nothing to eat or drink after midnight the night before your surgery. This includes chewing gum or sucking on hard candy.  Take only heart, blood pressure and cholesterol medications the morning of surgery with only a sip of water.  3) Please stop taking Plavix 10 days prior to your surgery. Stop taking Coumadin 5 days prior to your surgery. Stop taking all Aspirin or Aspirin containing products 7 days prior to your surgery. Stop taking Advil, Motrin, Aleve, and etc. 3 days prior to your surgery.  4) If you take any diabetic medications please consult with your primary care physician on how to take them on the day of your surgery  5) Please stop all Herbal products 2 weeks prior to your surgery.  6) Please arrive at the hospital 2 hours prior to your surgery, unless you have been otherwise instructed.  7) Patients having an operation on their colon will be given a separate instruction sheet on their Bowel Prep.  8) For any pre-operative work up check in at the main entrance to [facility], and then go to Patient Registration. These studies are done on a walk in basis they are open from 7:00am to 5:00pm Monday through Friday.   9) Please wash your surgical site the morning of your surgery with soap and water.  10) If you are of child bearing age you will have pregnancy test done the morning of your surgery as soon as you arrive.  11) You may be contacted to change your surgery time. At times this is necessary due to equipment or staffing needs.    After Surgery Instructions:   You will need to be seen in the office for a follow-up visit 7-14 days after your surgery. Please call after you have had the procedure to make this appointment.  Unless otherwise instructed, you may remove your outer bandage and shower 48 hours after your surgery.  If you develop a fever greater than 101, have any significant drainage, bleeding, swelling and/or pus of the wound. Please call our office immediately.    Surgery Date and Time: Tuesday, December 23, 2016 at 11:15am    Please check in at [facility], enter through the Emergency Room entrance and go up to the second floor. Please check in by 9:15am the day of your surgery.     You may contact Rene Kocher with any questions at [phone].

## 2016-12-16 ENCOUNTER — Encounter: Attending: Internal Medicine | Primary: Internal Medicine

## 2016-12-17 ENCOUNTER — Encounter

## 2016-12-17 NOTE — Progress Notes (Signed)
Email received from our concierge at [facility] central scheduling informing Bridget Morton health insurance will only approve a CT of Abd Pelvic with contrast.  Request for Dr. Walker Kehr to change order submitted.

## 2016-12-18 MED ORDER — FUROSEMIDE 40 MG TAB
40 mg | ORAL_TABLET | ORAL | 0 refills | Status: DC
Start: 2016-12-18 — End: 2016-12-22

## 2016-12-19 ENCOUNTER — Inpatient Hospital Stay: Admit: 2016-12-19 | Payer: PRIVATE HEALTH INSURANCE | Attending: Surgery | Primary: Internal Medicine

## 2016-12-19 ENCOUNTER — Ambulatory Visit

## 2016-12-19 DIAGNOSIS — K828 Other specified diseases of gallbladder: Secondary | ICD-10-CM

## 2016-12-19 LAB — CREATININE, POC
Creatinine, POC: 0.8 MG/DL (ref 0.6–1.3)
GFRAA, POC: >60 mL/min/{1.73_m2} (ref >60)
GFRNA, POC: >60 mL/min/{1.73_m2} (ref >60)

## 2016-12-19 MED ORDER — IOPAMIDOL 61 % IV SOLN
300 mg iodine /mL (61 %) | Freq: Once | INTRAVENOUS | Status: AC
Start: 2016-12-19 — End: 2016-12-19
  Administered 2016-12-19: 17:00:00 via INTRAVENOUS

## 2016-12-19 MED ORDER — BARIUM SULFATE 2 % ORAL SUSP
2.1 % (w/v), 2.0 % (w/w) | Freq: Once | ORAL | Status: AC
Start: 2016-12-19 — End: 2016-12-19
  Administered 2016-12-19: 17:00:00 via ORAL

## 2016-12-19 MED FILL — READI-CAT 2  2.1 % (W/V), 2.0 % (W/W) ORAL SUSPENSION: 2.1 % (w/v), 2.0 % (w/w) | ORAL | Qty: 900

## 2016-12-19 MED FILL — ISOVUE-300  61 % INTRAVENOUS SOLUTION: 300 mg iodine /mL (61 %) | INTRAVENOUS | Qty: 100

## 2016-12-22 NOTE — Other (Signed)
PAT - SURGICAL PRE-ADMISSION INSTRUCTIONS    NAME:  Bridget Morton                                                          TODAY'S DATE:  12/22/2016    SURGERY DATE:  12/23/2016                                  SURGERY ARRIVAL TIME:   0915    1. Do NOT eat or drink anything, including candy or gum, after MIDNIGHT on 12/22/2016 , unless you have specific instructions from your Surgeon or Anesthesia Provider to do so.  2. No smoking on the day of surgery.  3. No alcohol 24 hours prior to the day of surgery.  4. No recreational drugs for one week prior to the day of surgery.  5. Leave all valuables, including money/purse, at home.  6. Remove all jewelry, nail polish, makeup (including mascara); no lotions, powders, deodorant, or perfume/cologne/after shave.  7. Glasses/Contact lenses and Dentures may be worn to the hospital.  They will be removed prior to surgery.  8. Call your doctor if symptoms of a cold or illness develop within 24 ours prior to surgery.  9. AN ADULT MUST DRIVE YOU HOME AFTER OUTPATIENT SURGERY.   10. If you are having an OUTPATIENT procedure, please make arrangements for a responsible adult to be with you for 24 hours after your surgery.  11. If you are admitted to the hospital, you will be assigned to a bed after surgery is complete.  Normally a family member will not be able to see you until you are in your assigned bed.  12. Family is encouraged to accompany you to the hospital.  We ask visitors in the treatment area to be limited to ONE person at a time to ensure patient privacy.  EXCEPTIONS WILL BE MADE AS NEEDED.  13. Children under 12 are discouraged from entering the treatment area and need to be supervised by an adult when in the waiting room.    Special Instructions:    Take these medications the morning of surgery with a sip of water:  Synthroid, Levorphanol, Bring any pertinent legal medical records.    Patient Prep:    shower with anti-bacterial soap     These surgical instructions were reviewed with Bridget Morton during the PAT  phone call.       Directions:  On the morning of surgery, please go to the [facility].  Enter the building from the [employer] lot entrance, 1st floor (next to the Emergency Room entrance).  Take the elevator to the 2nd floor.  Sign in at the Registration Desk.    If you have any questions and/or concerns, please do not hesitate to call:  (Prior to the day of surgery)  [facility] unit:  [phone]  (Day of surgery)  [facility] unit:  [phone]

## 2016-12-23 ENCOUNTER — Inpatient Hospital Stay: Payer: PRIVATE HEALTH INSURANCE

## 2016-12-23 MED ORDER — BUPIVACAINE-EPINEPHRINE (PF) 0.5 %-1:200,000 IJ SOLN
0.5 %-1:200,000 | INTRAMUSCULAR | Status: AC
Start: 2016-12-23 — End: ?

## 2016-12-23 MED ORDER — DEXAMETHASONE SODIUM PHOSPHATE 4 MG/ML IJ SOLN
4 mg/mL | INTRAMUSCULAR | Status: AC
Start: 2016-12-23 — End: ?

## 2016-12-23 MED ORDER — SUCCINYLCHOLINE CHLORIDE 20 MG/ML INJECTION
20 mg/mL | INTRAMUSCULAR | Status: DC | PRN
Start: 2016-12-23 — End: 2016-12-23
  Administered 2016-12-23: 17:00:00 via INTRAVENOUS

## 2016-12-23 MED ORDER — SODIUM CHLORIDE 0.9 % IV
INTRAVENOUS | Status: DC | PRN
Start: 2016-12-23 — End: 2016-12-23
  Administered 2016-12-23: 17:00:00

## 2016-12-23 MED ORDER — BUPIVACAINE-EPINEPHRINE (PF) 0.5 %-1:200,000 IJ SOLN
0.5 %-1:200,000 | INTRAMUSCULAR | Status: DC | PRN
Start: 2016-12-23 — End: 2016-12-23
  Administered 2016-12-23: 17:00:00 via SUBCUTANEOUS

## 2016-12-23 MED ORDER — PROPOFOL 10 MG/ML IV EMUL
10 mg/mL | INTRAVENOUS | Status: DC | PRN
Start: 2016-12-23 — End: 2016-12-23
  Administered 2016-12-23: 17:00:00 via INTRAVENOUS

## 2016-12-23 MED ORDER — FENTANYL CITRATE (PF) 50 MCG/ML IJ SOLN
50 mcg/mL | INTRAMUSCULAR | Status: AC
Start: 2016-12-23 — End: ?

## 2016-12-23 MED ORDER — CEFAZOLIN 1 GRAM SOLUTION FOR INJECTION
1 gram | INTRAMUSCULAR | Status: DC | PRN
Start: 2016-12-23 — End: 2016-12-23
  Administered 2016-12-23: 17:00:00 via INTRAVENOUS

## 2016-12-23 MED ORDER — FENTANYL CITRATE (PF) 50 MCG/ML IJ SOLN
50 mcg/mL | INTRAMUSCULAR | Status: DC | PRN
Start: 2016-12-23 — End: 2016-12-23
  Administered 2016-12-23: 19:00:00 via INTRAVENOUS

## 2016-12-23 MED ORDER — ONDANSETRON (PF) 4 MG/2 ML INJECTION
4 mg/2 mL | INTRAMUSCULAR | Status: AC
Start: 2016-12-23 — End: ?

## 2016-12-23 MED ORDER — LACTATED RINGERS IV
INTRAVENOUS | Status: DC
Start: 2016-12-23 — End: 2016-12-23

## 2016-12-23 MED ORDER — CEFAZOLIN 1 GRAM SOLUTION FOR INJECTION
1 gram | INTRAMUSCULAR | Status: AC
Start: 2016-12-23 — End: ?

## 2016-12-23 MED ORDER — HYDROMORPHONE 2 MG/ML INJECTION SOLUTION
2 mg/mL | INTRAMUSCULAR | Status: AC
Start: 2016-12-23 — End: 2016-12-23
  Administered 2016-12-23: 18:00:00 via INTRAVENOUS

## 2016-12-23 MED ORDER — FAMOTIDINE 20 MG TAB
20 mg | Freq: Once | ORAL | Status: AC
Start: 2016-12-23 — End: 2016-12-23
  Administered 2016-12-23: 14:00:00 via ORAL

## 2016-12-23 MED ORDER — SUCCINYLCHOLINE CHLORIDE 100 MG/5 ML (20 MG/ML) IV SYRINGE
100 mg/5 mL (20 mg/mL) | INTRAVENOUS | Status: AC
Start: 2016-12-23 — End: ?

## 2016-12-23 MED ORDER — NEOSTIGMINE METHYLSULFATE 5 MG/5 ML (1 MG/ML) IV SYRINGE
5 mg/ mL (1 mg/mL) | INTRAVENOUS | Status: DC | PRN
Start: 2016-12-23 — End: 2016-12-23
  Administered 2016-12-23: 17:00:00 via INTRAVENOUS

## 2016-12-23 MED ORDER — ONDANSETRON (PF) 4 MG/2 ML INJECTION
4 mg/2 mL | Freq: Once | INTRAMUSCULAR | Status: DC
Start: 2016-12-23 — End: 2016-12-23

## 2016-12-23 MED ORDER — HYDROMORPHONE 2 MG/ML INJECTION SOLUTION
2 mg/mL | INTRAMUSCULAR | Status: DC | PRN
Start: 2016-12-23 — End: 2016-12-23
  Administered 2016-12-23 (×4): via INTRAVENOUS

## 2016-12-23 MED ORDER — HYDRALAZINE 20 MG/ML IJ SOLN
20 mg/mL | INTRAMUSCULAR | Status: AC
Start: 2016-12-23 — End: ?

## 2016-12-23 MED ORDER — INDOCYANINE GREEN 25 MG INJECTION
25 mg | INTRAMUSCULAR | Status: AC
Start: 2016-12-23 — End: ?

## 2016-12-23 MED ORDER — FENTANYL CITRATE (PF) 50 MCG/ML IJ SOLN
50 mcg/mL | INTRAMUSCULAR | Status: DC | PRN
Start: 2016-12-23 — End: 2016-12-23
  Administered 2016-12-23 (×3): via INTRAVENOUS

## 2016-12-23 MED ORDER — HYDRALAZINE 20 MG/ML IJ SOLN
20 mg/mL | INTRAMUSCULAR | Status: DC | PRN
Start: 2016-12-23 — End: 2016-12-23
  Administered 2016-12-23: 17:00:00 via INTRAVENOUS

## 2016-12-23 MED ORDER — KETOROLAC TROMETHAMINE 30 MG/ML INJECTION
30 mg/mL (1 mL) | INTRAMUSCULAR | Status: AC
Start: 2016-12-23 — End: ?

## 2016-12-23 MED ORDER — GLYCOPYRROLATE 0.2 MG/ML IJ SOLN
0.2 mg/mL | INTRAMUSCULAR | Status: AC
Start: 2016-12-23 — End: ?

## 2016-12-23 MED ORDER — LACTATED RINGERS IV
INTRAVENOUS | Status: DC
Start: 2016-12-23 — End: 2016-12-23
  Administered 2016-12-23: 14:00:00 via INTRAVENOUS

## 2016-12-23 MED ORDER — KETOROLAC TROMETHAMINE 30 MG/ML INJECTION
30 mg/mL (1 mL) | INTRAMUSCULAR | Status: DC | PRN
Start: 2016-12-23 — End: 2016-12-23
  Administered 2016-12-23: 17:00:00 via INTRAVENOUS

## 2016-12-23 MED ORDER — LIDOCAINE (PF) 20 MG/ML (2 %) IJ SOLN
20 mg/mL (2 %) | INTRAMUSCULAR | Status: DC | PRN
Start: 2016-12-23 — End: 2016-12-23
  Administered 2016-12-23: 17:00:00 via INTRAVENOUS

## 2016-12-23 MED ORDER — MIDAZOLAM 1 MG/ML IJ SOLN
1 mg/mL | INTRAMUSCULAR | Status: AC
Start: 2016-12-23 — End: ?

## 2016-12-23 MED ORDER — PROPOFOL 10 MG/ML IV EMUL
10 mg/mL | INTRAVENOUS | Status: AC
Start: 2016-12-23 — End: ?

## 2016-12-23 MED ORDER — SODIUM CHLORIDE 0.9 % IJ SYRG
Freq: Three times a day (TID) | INTRAMUSCULAR | Status: DC
Start: 2016-12-23 — End: 2016-12-23

## 2016-12-23 MED ORDER — OXYCODONE-ACETAMINOPHEN 5 MG-325 MG TAB
5-325 mg | ORAL_TABLET | ORAL | 0 refills | Status: AC | PRN
Start: 2016-12-23 — End: ?

## 2016-12-23 MED ORDER — MIDAZOLAM 1 MG/ML IJ SOLN
1 mg/mL | INTRAMUSCULAR | Status: DC | PRN
Start: 2016-12-23 — End: 2016-12-23
  Administered 2016-12-23: 17:00:00 via INTRAVENOUS

## 2016-12-23 MED ORDER — DEXAMETHASONE SODIUM PHOSPHATE 4 MG/ML IJ SOLN
4 mg/mL | INTRAMUSCULAR | Status: DC | PRN
Start: 2016-12-23 — End: 2016-12-23
  Administered 2016-12-23: 17:00:00 via INTRAVENOUS

## 2016-12-23 MED ORDER — GLYCOPYRROLATE 0.2 MG/ML IJ SOLN
0.2 mg/mL | INTRAMUSCULAR | Status: DC | PRN
Start: 2016-12-23 — End: 2016-12-23
  Administered 2016-12-23: 17:00:00 via INTRAVENOUS

## 2016-12-23 MED ORDER — VECURONIUM BROMIDE 10 MG IV SOLR
10 mg | INTRAVENOUS | Status: DC | PRN
Start: 2016-12-23 — End: 2016-12-23
  Administered 2016-12-23: 17:00:00 via INTRAVENOUS

## 2016-12-23 MED ORDER — SODIUM CHLORIDE 0.9 % IJ SYRG
INTRAMUSCULAR | Status: DC | PRN
Start: 2016-12-23 — End: 2016-12-23

## 2016-12-23 MED ORDER — NEOSTIGMINE METHYLSULFATE 3 MG/3 ML (1 MG/ML) IV SYRINGE
3 mg/ mL (1 mg/mL) | INTRAVENOUS | Status: AC
Start: 2016-12-23 — End: ?

## 2016-12-23 MED ORDER — ONDANSETRON (PF) 4 MG/2 ML INJECTION
4 mg/2 mL | INTRAMUSCULAR | Status: DC | PRN
Start: 2016-12-23 — End: 2016-12-23
  Administered 2016-12-23: 17:00:00 via INTRAVENOUS

## 2016-12-23 MED ORDER — LIDOCAINE (PF) 20 MG/ML (2 %) IJ SOLN
20 mg/mL (2 %) | INTRAMUSCULAR | Status: AC
Start: 2016-12-23 — End: ?

## 2016-12-23 MED FILL — PROPOFOL 10 MG/ML IV EMUL: 10 mg/mL | INTRAVENOUS | Qty: 20

## 2016-12-23 MED FILL — HYDROMORPHONE 2 MG/ML INJECTION SOLUTION: 2 mg/mL | INTRAMUSCULAR | Qty: 1

## 2016-12-23 MED FILL — BD POSIFLUSH NORMAL SALINE 0.9 % INJECTION SYRINGE: INTRAMUSCULAR | Qty: 10

## 2016-12-23 MED FILL — MARCAINE-EPINEPHRINE (PF) 0.5 %-1:200,000 INJECTION SOLUTION: 0.5 %-1:200,000 | INTRAMUSCULAR | Qty: 30

## 2016-12-23 MED FILL — MIDAZOLAM 1 MG/ML IJ SOLN: 1 mg/mL | INTRAMUSCULAR | Qty: 2

## 2016-12-23 MED FILL — ONDANSETRON (PF) 4 MG/2 ML INJECTION: 4 mg/2 mL | INTRAMUSCULAR | Qty: 2

## 2016-12-23 MED FILL — FAMOTIDINE 20 MG TAB: 20 mg | ORAL | Qty: 1

## 2016-12-23 MED FILL — FENTANYL CITRATE (PF) 50 MCG/ML IJ SOLN: 50 mcg/mL | INTRAMUSCULAR | Qty: 2

## 2016-12-23 MED FILL — LIDOCAINE (PF) 20 MG/ML (2 %) IJ SOLN: 20 mg/mL (2 %) | INTRAMUSCULAR | Qty: 5

## 2016-12-23 MED FILL — HYDRALAZINE 20 MG/ML IJ SOLN: 20 mg/mL | INTRAMUSCULAR | Qty: 1

## 2016-12-23 MED FILL — LACTATED RINGERS IV: INTRAVENOUS | Qty: 1000

## 2016-12-23 MED FILL — DEXAMETHASONE SODIUM PHOSPHATE 4 MG/ML IJ SOLN: 4 mg/mL | INTRAMUSCULAR | Qty: 1

## 2016-12-23 MED FILL — NEOSTIGMINE METHYLSULFATE 3 MG/3 ML (1 MG/ML) IV SYRINGE: 3 mg/ mL (1 mg/mL) | INTRAVENOUS | Qty: 3

## 2016-12-23 MED FILL — CEFAZOLIN 1 GRAM SOLUTION FOR INJECTION: 1 gram | INTRAMUSCULAR | Qty: 2000

## 2016-12-23 MED FILL — IC GREEN 25 MG SOLUTION FOR INJECTION: 25 mg | INTRAMUSCULAR | Qty: 25

## 2016-12-23 MED FILL — KETOROLAC TROMETHAMINE 30 MG/ML INJECTION: 30 mg/mL (1 mL) | INTRAMUSCULAR | Qty: 1

## 2016-12-23 MED FILL — GLYCOPYRROLATE 0.2 MG/ML IJ SOLN: 0.2 mg/mL | INTRAMUSCULAR | Qty: 2

## 2016-12-23 MED FILL — SUCCINYLCHOLINE CHLORIDE 100 MG/5 ML (20 MG/ML) IV SYRINGE: 100 mg/5 mL (20 mg/mL) | INTRAVENOUS | Qty: 5

## 2016-12-23 NOTE — Brief Op Note (Signed)
BRIEF OPERATIVE NOTE    Date of Procedure: 12/23/2016   Preoperative Diagnosis: hydrops of gallbladder  k82.1 RUQ abd pain r10.11  Postoperative Diagnosis: * No post-op diagnosis entered *    Procedure(s):  davinci CHOLECYSTECTOMY with firefly ROBOTIC ASSISTED  "multiport"  Surgeon(s) and Role:     * Bradly Chris, MD - Primary  Surgical Staff:  Circ-1: Marni Griffon, RN  Scrub Tech-1: Jadene Pierini  Surg Asst-1: Haig Prophet  Event Time In   Incision Start 1249   Incision Close      Anesthesia: General   Estimated Blood Loss: Minimal  Specimens:   ID Type Source Tests Collected by Time Destination   1 : GALLBLADDER Preservative   Bradly Chris, MD 12/23/2016 1314 Pathology      Findings: enlarged gallbladder.  Adhesions.    Complications: None.   Implants: * No implants in log *

## 2016-12-23 NOTE — Op Note (Signed)
[facility]  OPERATIVE REPORT    Bridget Morton, Bridget Morton  MR#: [id number]  DOB: 1955-09-24  ACCOUNT #: [id number]   DATE OF SERVICE: 12/23/2016    SURGEON:  Lawanna Kobus, MD    ASSISTANT:  Ronelle Nigh     PREOPERATIVE DIAGNOSIS:  Chronic cholecystitis.    POSTOPERATIVE DIAGNOSIS:  Chronic cholecystitis with adhesions.    PROCEDURE PERFORMED:  Robotic lysis of adhesion and cholecystectomy with Firefly to identify the biliary tree.    ANESTHESIA:  General.     COMPLICATIONS:  None.    SPECIMENS REMOVED:  Gallbladder.    ESTIMATED BLOOD LOSS:  Minimal.    IMPLANTS:  None.     DETAILS OF PROCEDURE:  The patient was brought to the operating room.  Anesthesia was induced.  Scrubbing and draping of the abdomen was done in usual manner.  A timeout was performed.  A Veress needle was inserted in the left upper quadrant.  Abdomen was insufflated.  A 5 mm port was first placed in the left upper quadrant and then this was enlarged to a 5 mm port and then the scope was inserted and we were able to identify a significant adhesion which were we placed another port on the right side of the abdominal wall and we took them down with the blunt and sharp dissection and then at this point, we placed a 12 mm port in the supraumbilical area and under direct visualization and then at this point, the robot was docked.  The gallbladder was extremely dilated and enlarged, but it did not look infected.  The wall did not look thickened.  The liver looked completely normal.  No evidence of any mass or pathology.  The omentum looked completely normal with no evidence of any metastatic disease.  At this point, the Firefly was used to identify the cystic duct at the junction with the common bile duct and at this point, the cystic duct was dissected.  It was clipped and divided.  The cystic artery was cauterized.  The gallbladder was taken out from the gallbladder using  electrocautery.  An EndoCatch was inserted and the gallbladder was taken out through the 12 mm port.  The instruments were removed.  The robot was undocked and the skin incisions were closed with 4-0 Monocryl.      Bridget Chris, MD       Fredia Sorrow / [place]  D: 12/23/2016 13:24     T: 12/23/2016 16:19  JOB #: [id number]

## 2016-12-23 NOTE — Progress Notes (Signed)
Date of Surgery Update:  Bridget Morton was seen and examined.  History and physical has been reviewed. The patient has been examined. There have been no significant clinical changes since the completion of the originally dated History and Physical. Will proceed with robotic cholecystectomy with firefly. I reviewed her CT scan, and there is no masses or biliary obstruction.     Signed By: Bradly Chris, MD     December 23, 2016 11:29 AM

## 2016-12-23 NOTE — Anesthesia Pre-Procedure Evaluation (Signed)
Anesthetic History   No history of anesthetic complications            Review of Systems / Medical History  Patient summary reviewed and pertinent labs reviewed    Pulmonary  Within defined limits                 Neuro/Psych         Psychiatric history     Cardiovascular    Hypertension: well controlled              Exercise tolerance: >4 METS     GI/Hepatic/Renal     GERD: well controlled      Liver disease     Endo/Other        Morbid obesity and arthritis     Other Findings   Comments: Documentation of current medication  Current medications obtained, documented and obtained? YES      Risk Factors for Postoperative nausea/vomiting:       History of postoperative nausea/vomiting?  NO       Female?  YES       Motion sickness?  NO       Intended opioid administration for postoperative analgesia?  YES      Smoking Abstinence:  Current Smoker?   NO  Elective Surgery? YES  Seen preoperatively by anesthesiologist or proxy prior to day of surgery?  YES  Pt abstained from smoking 24 hours prior to anesthesia? N/A    Preventive care/screening for High Blood Pressure:  Aged 18 years and older: YES  Screened for high blood pressure: YES  Patients with high blood pressure referred to primary care provider   for BP management: YES                 Physical Exam    Airway  Mallampati: II  TM Distance: 4 - 6 cm  Neck ROM: normal range of motion   Mouth opening: Normal     Cardiovascular    Rhythm: regular  Rate: normal         Dental    Dentition: Caps/crowns     Pulmonary  Breath sounds clear to auscultation               Abdominal  GI exam deferred       Other Findings            Anesthetic Plan    ASA: 2  Anesthesia type: general          Induction: Intravenous  Anesthetic plan and risks discussed with: Patient

## 2016-12-23 NOTE — Anesthesia Post-Procedure Evaluation (Signed)
Post-Anesthesia Evaluation and Assessment    Patient: Bridget Morton MRN: [id number]  SSN: [id number]    Date of Birth: 04-17-1956  Age: 61 y.o.  Sex: female       Cardiovascular Function/Vital Signs  Visit Vitals   ??? BP 119/68 (BP 1 Location: Left arm, BP Patient Position: At rest)   ??? Pulse 72   ??? Temp 36.3 ??C (97.3 ??F)   ??? Resp 14   ??? Ht 5\' 1"  (1.549 m)   ??? Wt 101.6 kg (224 lb)   ??? SpO2 98%   ??? BMI 42.32 kg/m2       Patient is status post general anesthesia for Procedure(s):  davinci CHOLECYSTECTOMY with firefly ROBOTIC ASSISTED  "multiport".    Nausea/Vomiting: None    Postoperative hydration reviewed and adequate.    Pain:  Pain Scale 1: Numeric (0 - 10) (12/23/16 1518)  Pain Intensity 1: 6 (12/23/16 1518)   Managed    Neurological Status:   Neuro (WDL): Exceptions to WDL (12/23/16 1336)  Neuro  Neurologic State: Drowsy;Eyes open spontaneously (12/23/16 1336)  LUE Motor Response: Purposeful (12/23/16 1336)  LLE Motor Response: Purposeful (12/23/16 1336)  RUE Motor Response: Purposeful (12/23/16 1336)  RLE Motor Response: Purposeful (12/23/16 1336)   At baseline    Mental Status and Level of Consciousness: Alert and oriented     Pulmonary Status:   O2 Device: Room air (12/23/16 1341)   Adequate oxygenation and airway patent    Complications related to anesthesia: None    Post-anesthesia assessment completed. No concerns    Signed By: Tawanna Cooler, CRNA     December 23, 2016

## 2016-12-23 NOTE — Other (Signed)
Husband- Michael=- ok to speak to

## 2016-12-23 NOTE — Other (Signed)
Phase 2 Recovery Summary  Patient arrived to Phase 2 at 1517  Report received from [facility]  Vitals:    12/23/16 1351 12/23/16 1405 12/23/16 1406 12/23/16 1518   BP: 144/63  117/66 119/68   Pulse: 75 71 72 72   Resp: 19 9 13 14    Temp:       SpO2: 97% 96% 97% 98%   Weight:       Height:           oriented to time, place, person and situation    Lines and Drains  Peripheral Intravenous Line:   Peripheral IV 12/23/16 Right Arm (Active)   Site Assessment Clean, dry, & intact 12/23/2016  3:26 PM   Phlebitis Assessment 0 12/23/2016  3:26 PM   Infiltration Assessment 0 12/23/2016  3:26 PM   Dressing Status Clean, dry, & intact 12/23/2016  3:26 PM   Dressing Type Tape;Transparent 12/23/2016  3:26 PM   Hub Color/Line Status Pink;Infusing 12/23/2016  3:26 PM   Alcohol Cap Used Yes 12/23/2016  1:36 PM       Wound  Wound Abdomen Anterior (Active)   DRESSING STATUS Clean, dry, and intact 12/23/2016  1:36 PM   DRESSING TYPE Sutures;Topical skin adhesive/glue 12/23/2016  1:36 PM   Incision site well approximated? Yes 12/23/2016  1:36 PM   Number of days:0       Wound Abdomen (Active)   Number of days:0              Patient discharged to home with husband, Casimiro Needle  at [address] [place]

## 2016-12-23 NOTE — Other (Signed)
Assumed care of pt from Saintclair Halsted., RN with pt lying on stretcher.  Awaiting follow-up from anesthesia regarding pain mgmt.  Will cont to monitor.

## 2016-12-23 NOTE — Other (Signed)
1336  Assumed care of patient upon arrival to PACU.  Patient drowsy, eyes open spontaneously.  Placed on monitor x3. VSS.  Visual pain scale 10/10 at this time.

## 2016-12-23 NOTE — Op Note (Signed)
[facility]  OPERATIVE REPORT    VALORIE, FLIKKEMA  MR#: [id number]  DOB: [name] 30, 1956  ACCOUNT #: [id number]   DATE OF SERVICE: 12/23/2016    SURGEON:  Lawanna Kobus, MD    ASSISTANT:  Ronelle Nigh     PREOPERATIVE DIAGNOSIS:  Chronic cholecystitis.    POSTOPERATIVE DIAGNOSIS:  Chronic cholecystitis with adhesions.    PROCEDURE PERFORMED:  Robotic lysis of adhesion and cholecystectomy with Firefly to identify the biliary tree.    ANESTHESIA:  General.     COMPLICATIONS:  None.    SPECIMENS REMOVED:  Gallbladder.    ESTIMATED BLOOD LOSS:  Minimal.    IMPLANTS:  None.     DETAILS OF PROCEDURE:  The patient was brought to the operating room.  Anesthesia was induced.  Scrubbing and draping of the abdomen was done in usual manner.  A timeout was performed.  A Veress needle was inserted in the left upper quadrant.  Abdomen was insufflated.  A 5 mm port was first placed in the left upper quadrant and then this was enlarged to a 5 mm port and then the scope was inserted and we were able to identify a significant adhesion which were we placed another port on the right side of the abdominal wall and we took them down with the blunt and sharp dissection and then at this point, we placed a 12 mm port in the supraumbilical area and under direct visualization and then at this point, the robot was docked.  The gallbladder was extremely dilated and enlarged, but it did not look infected.  The wall did not look thickened.  The liver looked completely normal.  No evidence of any mass or pathology.  The omentum looked completely normal with no evidence of any metastatic disease.  At this point, the Firefly was used to identify the cystic duct at the junction with the common bile duct and at this point, the cystic duct was dissected.  It was clipped and divided.  The cystic artery was cauterized.  The gallbladder was taken out from the gallbladder using electrocautery.  An EndoCatch was inserted and the  gallbladder was taken out through the 12 mm port.  The instruments were removed.  The robot was undocked and the skin incisions were closed with 4-0 Monocryl.      Bradly Chris, MD       Fredia Sorrow / [place]  D: 12/23/2016 13:24     T: 12/23/2016 16:19  JOB #: [id number]

## 2016-12-25 ENCOUNTER — Ambulatory Visit
Admit: 2016-12-25 | Discharge: 2016-12-25 | Payer: PRIVATE HEALTH INSURANCE | Attending: Surgery | Primary: Internal Medicine

## 2016-12-25 DIAGNOSIS — Z09 Encounter for follow-up examination after completed treatment for conditions other than malignant neoplasm: Secondary | ICD-10-CM

## 2016-12-25 NOTE — Progress Notes (Signed)
Patient Bridget Morton, c/o surgical isite irritation, redness, swelling, tenderness, pain. Patient states she tolerated lunch well yesterday but solid food upset her stomach last night. Complaints of fever in the eveing time. Patient states she has not tken an addition OTC medication for fever or discomforte.     1. Have you been to the ER, urgent care clinic since your last visit?  Hospitalized since your last visit?No    2. Have you seen or consulted any other health care providers outside of the [facility] since your last visit? No

## 2016-12-25 NOTE — Progress Notes (Signed)
Patient seen and examined.  She called me yesterday and said she has wound redness. Today she said the redness has changed to blue coloration and it looks like bruises.  She is tolerating diet. Having bowel movement. Has minimal abdominal pain.  Abdomen Is soft and non-tender. Wounds are clean. No erythema or drainage.  Follow up with me in 2 weeks.

## 2016-12-25 NOTE — Patient Instructions (Signed)
If you have any questions or concerns about today's appointment, the verbal and/or written instructions you were given for follow up care, please call our office at [phone].    [facility]  [address], [address]  [place] [address]    [phone] office  [phone]

## 2016-12-30 ENCOUNTER — Ambulatory Visit
Admit: 2016-12-30 | Discharge: 2016-12-30 | Payer: PRIVATE HEALTH INSURANCE | Attending: Surgery | Primary: Internal Medicine

## 2016-12-30 DIAGNOSIS — Z09 Encounter for follow-up examination after completed treatment for conditions other than malignant neoplasm: Secondary | ICD-10-CM

## 2016-12-30 NOTE — Progress Notes (Signed)
Patient seen and examined.  She is stating that she is having severe headache and she had fever at home.  She also states that she has a spot on her face on the left side that is tender.  The patient stated that she thinks she may have sinusitis but she was not sure if he should come to our office or go to her primary care physician. She denies any abdominal pain.  She denies any nausea or vomiting and she is tolerating regular diet.    On exam, the patient is afebrile and not tachycardic. Her abdomen is soft and non tender.  Her wounds are healing well.   Her pathology showed chronic cholecystitis as discussed before.    I explained to the patient that she needs to follow-up with her primary care physician for management of her headache and possible sinusitis.  The patient did understand and she agreed with the plan.

## 2016-12-30 NOTE — Patient Instructions (Signed)
If you have any questions or concerns about today's appointment, the verbal and/or written instructions you were given for follow up care, please call our office at [phone].    [facility]  [address], [address]  [place] [address]    [phone] office  [phone]

## 2016-12-30 NOTE — Progress Notes (Signed)
Bridget SparrowJanet Mize Morton is a 61 y.o. female who presents today with No chief complaint on file.                 1. Have you been to the ER, urgent care clinic since your last visit?  Hospitalized since your last visit?No    2. Have you seen or consulted any other health care providers outside of the [facility] since your last visit?  Include any pap smears or colon screening. No

## 2017-01-02 MED FILL — ONDANSETRON (PF) 4 MG/2 ML INJECTION: 4 mg/2 mL | INTRAMUSCULAR | Qty: 4

## 2017-01-02 MED FILL — NEOSTIGMINE METHYLSULFATE 1 MG/ML INJECTION: 1 mg/mL | INTRAMUSCULAR | Qty: 3

## 2017-01-02 MED FILL — LACTATED RINGERS IV: INTRAVENOUS | Qty: 600

## 2017-01-02 MED FILL — GLYCOPYRROLATE 0.2 MG/ML IJ SOLN: 0.2 mg/mL | INTRAMUSCULAR | Qty: 0.4

## 2017-01-02 MED FILL — HYDRALAZINE 20 MG/ML IJ SOLN: 20 mg/mL | INTRAMUSCULAR | Qty: 10

## 2017-01-02 MED FILL — VECURONIUM BROMIDE 10 MG IV SOLR: 10 mg | INTRAVENOUS | Qty: 3

## 2017-01-02 MED FILL — QUELICIN 20 MG/ML INJECTION SOLUTION: 20 mg/mL | INTRAMUSCULAR | Qty: 100

## 2017-01-02 MED FILL — LIDOCAINE (PF) 20 MG/ML (2 %) IJ SOLN: 20 mg/mL (2 %) | INTRAMUSCULAR | Qty: 80

## 2017-01-02 MED FILL — KETOROLAC TROMETHAMINE 30 MG/ML INJECTION: 30 mg/mL (1 mL) | INTRAMUSCULAR | Qty: 30

## 2017-01-02 MED FILL — CEFAZOLIN 1 GRAM SOLUTION FOR INJECTION: 1 gram | INTRAMUSCULAR | Qty: 2

## 2017-01-02 MED FILL — DEXAMETHASONE SODIUM PHOSPHATE 4 MG/ML IJ SOLN: 4 mg/mL | INTRAMUSCULAR | Qty: 4

## 2017-01-02 MED FILL — PROPOFOL 10 MG/ML IV EMUL: 10 mg/mL | INTRAVENOUS | Qty: 150

## 2017-01-07 ENCOUNTER — Encounter: Attending: Surgery | Primary: Internal Medicine

## 2017-01-07 ENCOUNTER — Encounter

## 2017-01-09 MED ORDER — METOPROLOL SUCCINATE SR 100 MG 24 HR TAB
100 mg | ORAL_TABLET | Freq: Every day | ORAL | 1 refills | Status: DC
Start: 2017-01-09 — End: 2017-03-09

## 2017-01-18 ENCOUNTER — Inpatient Hospital Stay
Admit: 2017-01-18 | Discharge: 2017-01-20 | Disposition: A | Discharge status: Home / Self Care | Payer: PRIVATE HEALTH INSURANCE | Attending: Surgery | Admitting: Surgery

## 2017-01-18 DIAGNOSIS — R1013 Epigastric pain: Principal | ICD-10-CM

## 2017-01-18 DIAGNOSIS — K219 Gastro-esophageal reflux disease without esophagitis: Secondary | ICD-10-CM | POA: Insufficient documentation

## 2017-01-18 DIAGNOSIS — E039 Hypothyroidism, unspecified: Secondary | ICD-10-CM | POA: Insufficient documentation

## 2017-01-18 DIAGNOSIS — T888XXA Other specified complications of surgical and medical care, not elsewhere classified, initial encounter: Secondary | ICD-10-CM | POA: Insufficient documentation

## 2017-01-18 LAB — PTT: aPTT: 48.1 s — ABNORMAL HIGH (ref 23.0–36.4)

## 2017-01-18 LAB — PROTHROMBIN TIME + INR
INR: 1 (ref 0.8–1.2)
Prothrombin time: 12.4 s (ref 11.5–15.2)

## 2017-01-18 MED ORDER — SODIUM CHLORIDE 0.9 % IV
INTRAVENOUS | Status: DC
Start: 2017-01-18 — End: 2017-01-20
  Administered 2017-01-18 – 2017-01-20 (×4): via INTRAVENOUS

## 2017-01-18 MED ORDER — METOPROLOL TARTRATE 5 MG/5 ML IV SOLN
5 mg/ mL | Freq: Four times a day (QID) | INTRAVENOUS | Status: DC
Start: 2017-01-18 — End: 2017-01-20
  Administered 2017-01-19 – 2017-01-20 (×7): via INTRAVENOUS

## 2017-01-18 MED ORDER — ONDANSETRON (PF) 4 MG/2 ML INJECTION
4 mg/2 mL | INTRAMUSCULAR | Status: DC | PRN
Start: 2017-01-18 — End: 2017-01-20
  Administered 2017-01-19 (×2): via INTRAVENOUS

## 2017-01-18 MED ORDER — SODIUM CHLORIDE 0.9 % INJECTION
40 mg | Freq: Every day | INTRAMUSCULAR | Status: DC
Start: 2017-01-18 — End: 2017-01-20
  Administered 2017-01-19: 13:00:00 via INTRAVENOUS

## 2017-01-18 MED ORDER — SODIUM CHLORIDE 0.9 % IV
INTRAVENOUS | Status: DC
Start: 2017-01-18 — End: 2017-01-18
  Administered 2017-01-18: 22:00:00 via INTRAVENOUS

## 2017-01-18 MED FILL — SODIUM CHLORIDE 0.9 % IV: INTRAVENOUS | Qty: 1000

## 2017-01-18 NOTE — ED Provider Notes (Signed)
Sondra Barges  [facility] EMERGENCY DEPT      6:14 PM    Date: 01/18/2017  Patient Name: Bridget Morton    History of Presenting Illness     Chief Complaint   Patient presents with   ??? Post OP Complication       History Provided By: Patient    Chief Complaint: RUQ abd pain.   Duration:  Days  Timing:  Gradual  Location: RUQ with no radiation.   Quality: Dull  Severity: 5 out of 10  Modifying Factors: None.   Associated Symptoms: denies any other associated signs or symptoms    61 y.o. female with noted past medical history who presents to the emergency department with RUQ abd pain after cholecystectomy, seen at [facility] facility with CT showing fluid in GB fossa. Sent to [facility] where her surgeon, Dr. Nedra Hai, who performed surgery, is privileged.     No other complaints.     Nursing notes regarding the HPI and triage nursing notes were reviewed.     Prior medical records were reviewed.     Current Facility-Administered Medications   Medication Dose Route Frequency Provider Last Rate Last Dose   ??? 0.9% sodium chloride infusion  100 mL/hr IntraVENous CONTINUOUS Burtis Junes, MD 100 mL/hr at 01/18/17 1803 100 mL/hr at 01/18/17 1803   ??? 0.9% sodium chloride infusion  100 mL/hr IntraVENous CONTINUOUS Burtis Junes, MD       ??? ondansetron (ZOFRAN) injection 4 mg  4 mg IntraVENous Q4H PRN Burtis Junes, MD         Current Outpatient Prescriptions   Medication Sig Dispense Refill   ??? metoprolol succinate (TOPROL-XL) 100 mg tablet Take 1 Tab by mouth daily. 30 Tab 1   ??? oxyCODONE-acetaminophen (PERCOCET) 5-325 mg per tablet Take 1 Tab by mouth every four (4) hours as needed for Pain. Max Daily Amount: 6 Tabs. 24 Tab 0   ??? loratadine (CLARITIN) 10 mg tablet TK 1 T PO QD     ??? buPROPion SR (WELLBUTRIN SR) 150 mg SR tablet bupropion HCl SR 150 mg tablet,12 hr sustained-release     ??? lurasidone (LATUDA) 20 mg tab tablet Latuda 20 mg tablet     ??? levorphanol (LEVO-DROMORAN) 2 mg tablet Take 1 tablet every 8 hours by  oral route as directed for 30 days.     ??? cyclobenzaprine (FLEXERIL) 10 mg tablet TK 1 T PO Q 8 H PRF MSP  1   ??? celecoxib (CELEBREX) 200 mg capsule Take 1 Cap by mouth daily. 30 Cap 2   ??? levothyroxine (SYNTHROID) 25 mcg tablet Take 1 Tab by mouth Daily (before breakfast). 30 Tab 3   ??? furosemide (LASIX) 40 mg tablet TAKE 1 TABLET BY MOUTH DAILY 30 Tab 0   ??? alendronate (FOSAMAX) 70 mg tablet Take 1 Tab by mouth every seven (7) days. 4 Tab 3   ??? pantoprazole (PROTONIX) 40 mg tablet TAKE 1 TABLET BY MOUTH DAILY 90 Tab 3       Past History     Past Medical History:  Past Medical History:   Diagnosis Date   ??? Arthritis    ??? Chronic pain    ??? GERD (gastroesophageal reflux disease)    ??? H/O colonoscopy 2010    due 2020   ??? Hypertension    ??? Menopause        Past Surgical History:  Past Surgical History:   Procedure Laterality Date   ???  HX APPENDECTOMY     ??? HX BREAST BIOPSY Left     Benign   ??? HX CESAREAN SECTION  1978, 1982   ??? HX GASTRIC BYPASS  2004   ??? HX GYN     ??? HX KNEE REPLACEMENT      bilateral   ??? HX LAP CHOLECYSTECTOMY  12/23/2016   ??? HX MOHS PROCEDURES      x 3 on right   ??? HX ORTHOPAEDIC      L4-L5 fusion   ??? HX OTHER SURGICAL      spinal cord stimulator   ??? HX PARTIAL HYSTERECTOMY  2000    supracervical   ??? HX TONSIL AND ADENOIDECTOMY      childhood       Family History:  Family History   Problem Relation Age of Onset   ??? Alcohol abuse Mother    ??? Cancer Mother      breast   ??? Cancer Father      lung   ??? Diabetes Father    ??? Heart Disease Father    ??? Lung Disease Father    ??? Hypertension Father    ??? Cancer Brother      lung       Social History:  Social History   Substance Use Topics   ??? Smoking status: Former Smoker     Years: 10.00     Types: Cigarettes     Quit date: 12/16/2010   ??? Smokeless tobacco: Never Used   ??? Alcohol use No      Comment: seldom       Allergies:  Allergies   Allergen Reactions   ??? Adhesive Tape-Silicones Rash   ??? Opana Er [Oxymorphone] Other (comments)      Delirium.     Patient states she is not allergic.       Patient's primary care provider (as noted in EPIC):  Pravin Audree Bane, MD    Review of Systems    Visit Vitals   ??? BP 152/71 (BP 1 Location: Left arm, BP Patient Position: At rest)   ??? Pulse 84   ??? Temp 98.7 ??F (37.1 ??C)   ??? Resp 13   ??? Ht  (1.549 m)   ??? Wt 95.3 kg (210 lb)   ??? SpO2 99%   ??? BMI 39.68 kg/m2       Patient Vitals for the past 12 hrs:   Temp Pulse Resp BP SpO2   01/18/17 1740 - 84 13 - 99 %   01/18/17 1738 98.7 ??F (37.1 ??C) 92 12 152/71 99 %       PHYSICAL EXAM:    CONSTITUTIONAL:  Alert, in no apparent distress;  well developed;  well nourished.  HEAD:  Normocephalic, atraumatic.  EYES:  EOMI.  Non-icteric sclera.  Normal conjunctiva.  ENTM:  Nose:  no rhinorrhea.  Throat:  no erythema or exudate, mucous membranes moist.  NECK:  No JVD.  Supple  RESPIRATORY:  Chest clear, equal breath sounds, good air movement.  CARDIOVASCULAR:  Regular rate and rhythm.  No murmurs, rubs, or gallops.    GI:  Normal bowel sounds, abdomen soft with RUQ TTP.  No rebound or guarding.     BACK:  Non-tender.  UPPER EXT:  Normal inspection.  LOWER EXT:  No edema, no calf tenderness.  Distal pulses intact.  NEURO:  Moves all four extremities, and grossly normal motor exam.  SKIN:  No rashes;  Normal for age.  PSYCH:  Alert and normal affect.    ED  COURSE:  The patient was a transfer from [facility] since Dr. Nedra Hai, her surgeon, is at [facility].  CT a/p at [facility] shows fluid collection in the GB fossa, with patient s/p cholecystectomy by Dr. Nedra Hai.    Admit to Surgery    The patient was presented to the accepting surgeon, Dr. Raford Pitcher.      Consult Hospitalist    The hospitalist was called and the patient was presented for medical management of patient. I personally spoke with Dr. Orlinda Blalock, in the hospitalist group, about the patient's presentation and management.     I subsequently placed the noted hospitalist on the treatment team.      Diagnostic Study Results      Abnormal lab results from this emergency department encounter:  Labs Reviewed   PROTHROMBIN TIME + INR   PTT   TYPE & SCREEN       Lab values for this patient within approximately the last 12 hours:  No results found for this or any previous visit (from the past 12 hour(s)).    Radiologist and cardiologist interpretations if available at time of this note:  No results found.    Medication(s) ordered for patient during this emergency visit encounter:  Medications   0.9% sodium chloride infusion (100 mL/hr IntraVENous New Bag 01/18/17 1803)   0.9% sodium chloride infusion (not administered)   ondansetron (ZOFRAN) injection 4 mg (not administered)       Medical Decision Making     I am the first provider for this patient.    I reviewed the vital signs, available nursing notes, past medical history, past surgical history, family history and social history.    Vital Signs:  Reviewed the patient's vital signs.      ED COURSE AND MEDICAL DECISION MAKING:    The patient's pain improved in the ED with the noted medications.    On reassessment of the patient, the patient continues to have no surgical abdomen with no rebound nor guarding.  The patient does not appear septic by presentation, vital signs and laboratory results.      The patient continues to appear non-toxic in the emergency department on reevaluations.    Coding Diagnoses     Clinical Impression:   1. RUQ abdominal pain    2. History of cholecystectomy        Disposition     Disposition:  Admit.    Arlys John L. Westley Foots, M.D.  ABEM Board Certified Emergency Physician    Provider Attestation:  If a scribe was utilized in generation of this patient record, I personally performed the services described in the documentation, reviewed the documentation, as recorded by the scribe in my presence, and it accurately records the patient's history of presenting illness, review of systems, patient physical examination, and procedures performed by me as  the attending physician.     Arlys John L. Westley Foots, M.D.  ABEM Board Certified Emergency Physician  01/18/2017.  6:17 PM

## 2017-01-18 NOTE — Consults (Signed)
Medicine Consult Note    Patient: Bridget Morton   Age:  61 y.o.  Consulting Physician: Bridget NettersElizabeth Z Barrett, MD  Reason for Consult:  Medical management.    Assessment/Plan     Surgical site fluid collection   - possible abscess   - surgery consulting   - holding abx per their recommendations   - IVF    HTN   - will start scheduled IV lopressor, low dose, with hold parameters   - bp med/s can be adjusted as necessary   - restart PO meds with ok with surgery    Hypothyroid   - can remain off until able to take po unless NPO >7 days    GERD   - IV protonix daily for now, po when able.        HPI:   Bridget Morton is a 61 y.o. female who c/o progressive abdominal pain and fever.  3 weeks ago underwent robotic chole by Dr. Kristen LoaderYouseff, [facility] whom then transferred to us for surgical complication.  She has been having pain, Nausea and fever .  States the fever has been going on since the surgery daily now only at night.  Pain got so bad it prompted her to go to ED.  Dr. Raford PitcherBarrett saw patient and will inform Dr. Kristen LoaderYouseff of his patient's arrival.  [name], GERD and hypothyroidism.      Past Medical History:  Past Medical History:   Diagnosis Date   ??? Arthritis    ??? Chronic pain    ??? GERD (gastroesophageal reflux disease)    ??? H/O colonoscopy 2010    due 2020   ??? Hypertension    ??? Menopause        Past Surgical History:  Past Surgical History:   Procedure Laterality Date   ??? HX APPENDECTOMY     ??? HX BREAST BIOPSY Left     Benign   ??? HX CESAREAN SECTION  1978, 1982   ??? HX GASTRIC BYPASS  2004   ??? HX GYN     ??? HX KNEE REPLACEMENT      bilateral   ??? HX LAP CHOLECYSTECTOMY  12/23/2016   ??? HX MOHS PROCEDURES      x 3 on right   ??? HX ORTHOPAEDIC      L4-L5 fusion   ??? HX OTHER SURGICAL      spinal cord stimulator   ??? HX PARTIAL HYSTERECTOMY  2000    supracervical   ??? HX TONSIL AND ADENOIDECTOMY      childhood       Family History:  Family History   Problem Relation Age of Onset    ??? Alcohol abuse Mother    ??? Cancer Mother      breast   ??? Cancer Father      lung   ??? Diabetes Father    ??? Heart Disease Father    ??? Lung Disease Father    ??? Hypertension Father    ??? Cancer Brother      lung       Social History:  Social History     Social History   ??? Marital status: MARRIED     Spouse name: N/A   ??? Number of children: N/A   ??? Years of education: N/A     Social History Main Topics   ??? Smoking status: Former Smoker     Years: 10.00  Types: Cigarettes     Quit date: 12/16/2010   ??? Smokeless tobacco: Never Used   ??? Alcohol use No      Comment: seldom   ??? Drug use: No   ??? Sexual activity: Not Currently     Partners: Male     Birth control/ protection: None      Comment: Hysterectomy     Other Topics Concern   ??? None     Social History Narrative       Home Medications:  Prior to Admission medications    Medication Sig Start Date End Date Taking? Authorizing Provider   metoprolol succinate (TOPROL-XL) 100 mg tablet Take 1 Tab by mouth daily. 01/08/17   Jenene Slicker, MD   oxyCODONE-acetaminophen (PERCOCET) 5-325 mg per tablet Take 1 Tab by mouth every four (4) hours as needed for Pain. Max Daily Amount: 6 Tabs. 12/23/16   Bradly Chris, MD   loratadine (CLARITIN) 10 mg tablet TK 1 T PO QD    Historical Provider   buPROPion SR (WELLBUTRIN SR) 150 mg SR tablet bupropion HCl SR 150 mg tablet,12 hr sustained-release    Historical Provider   lurasidone (LATUDA) 20 mg tab tablet Latuda 20 mg tablet    Historical Provider   levorphanol (LEVO-DROMORAN) 2 mg tablet Take 1 tablet every 8 hours by oral route as directed for 30 days.    Historical Provider   cyclobenzaprine (FLEXERIL) 10 mg tablet TK 1 T PO Q 8 H PRF MSP 12/08/16   Historical Provider   celecoxib (CELEBREX) 200 mg capsule Take 1 Cap by mouth daily. 12/08/16   Jenene Slicker, MD   levothyroxine (SYNTHROID) 25 mcg tablet Take 1 Tab by mouth Daily (before breakfast). 12/08/16   Jenene Slicker, MD    furosemide (LASIX) 40 mg tablet TAKE 1 TABLET BY MOUTH DAILY 11/11/16   Arneta Cliche, MD   alendronate (FOSAMAX) 70 mg tablet Take 1 Tab by mouth every seven (7) days. 09/30/16   Jenene Slicker, MD   pantoprazole (PROTONIX) 40 mg tablet TAKE 1 TABLET BY MOUTH DAILY 09/17/15   Daine Gravel, MD       Allergies:  Allergies   Allergen Reactions   ??? Adhesive Tape-Silicones Rash   ??? Opana Er [Oxymorphone] Other (comments)     Delirium.     Patient states she is not allergic.       Review of Systems - positive responses in bold type   Constitutional: Negative for fever, chills, diaphoresis and unexpected weight change.   HENT: Negative for ear pain, congestion, sore throat, rhinorrhea, drooling, trouble swallowing, neck pain and tinnitus.   Eyes: Negative for photophobia, pain, redness and visual disturbance.   Respiratory: negative for shortness of breath, cough, choking, chest tightness, wheezing or stridor.   Cardiovascular: Negative for chest pain, palpitations and leg swelling.   Gastrointestinal: Negative for nausea, vomiting, abdominal pain, diarrhea, constipation, blood in stool, abdominal distention and anal bleeding.   Genitourinary: Negative for dysuria, urgency, frequency, hematuria, flank pain and difficulty urinating.   Musculoskeletal: Negative for back pain and arthralgias.   Skin: Negative for color change, rash and wound.   Neurological: Negative for dizziness, seizures, syncope, speech difficulty, light-headedness or headaches.   Hematological: Does not bruise/bleed easily.   Psychiatric/Behavioral: Negative for suicidal ideas, hallucinations, behavioral problems, self-injury or agitation       Physical Exam:     Visit Vitals   ??? BP 149/75   ???  Pulse 87   ??? Temp 98.2 ??F (36.8 ??C)   ??? Resp 10   ??? Ht  (1.549 m)   ??? Wt 95.3 kg (210 lb)   ??? SpO2 98%   ??? BMI 39.68 kg/m2       Physical Exam:  General appearance: alert, cooperative, no distress, appears stated age   Head: Normocephalic, without obvious abnormality, atraumatic  Neck: supple, trachea midline  Lungs: clear to auscultation bilaterally  Heart: regular rate and rhythm, S1, S2 normal, no murmur, click, rub or gallop  Extremities: extremities normal, atraumatic, no cyanosis or edema  Skin: Skin color, texture, turgor normal. No rashes or lesions  Neurologic: Grossly normal    Intake and Output:  Current Shift:     Last three shifts:       Lab/Data Reviewed:  CMP: No results found for: NA, K, CL, CO2, AGAP, GLU, BUN, CREA, GFRAA, GFRNA, CA, MG, PHOS, ALB, TBIL, TP, ALB, GLOB, AGRAT, SGOT, ALT, GPT  CBC: No results found for: WBC, HGB, HGBEXT, HCT, HCTEXT, PLT, PLTEXT, HGBEXT, HCTEXT, PLTEXT  All Cardiac Markers in the last 24 hours: No results found for: CPK, CK, CKMMB, CKMB, RCK3, CKMBT, CKNDX, CKND1, MYO, TROPT, TROIQ, TROI, TROPT, TNIPOC, BNP, BNPP    Medications Reviewed.      Gevena Barre, MD    January 18, 2017

## 2017-01-18 NOTE — ED Triage Notes (Signed)
Pt arrived via [facility] from [facility]. Awaiting orders. Pt states she is comfortable for now.

## 2017-01-18 NOTE — Progress Notes (Addendum)
1955 - Assumed pt care of patient. Pt in bed, alert and oriented x 4. Not in any form of distress. Frequent use items and call bell within reach. Verbalized understanding to call for assistance. Bed locked in lowest position. Pt complained of pain. Paged Dr. Andrey Campanile.     2002 - Dr. Andrey Campanile returned paged. Notified MD that pt complained of pain on her surgical site. Made aware that. patient has taken morphine today at 1600 and tolerated it well. No shortness of breath after to taking the medication. Order received to administer Morphine 2 mg IV Q3H PRN for pain.     Nausea and pain controlled with PRN medications as ordered.     63 - Bedside and Verbal shift change report given to [place], Charity fundraiser (oncoming nurse) by Clifton Custard, RN (offgoing nurse). Report included the following information SBAR, Kardex, Intake/Output, MAR and Recent Results.

## 2017-01-18 NOTE — ED Notes (Signed)
TRANSFER - ED to INPATIENT REPORT:    SBAR report made available to receiving floor on this patient being transferred to 2 CENTRAL (2200)  for routine progression of care       Admitting diagnosis Fluid collection at surgical site, initial encounter    Information from the following report(s) SBAR, ED Summary, MAR and Recent Results was made available to receiving floor.    Lines:   Peripheral IV 01/18/17 Right Antecubital (Active)   Site Assessment Clean, dry, & intact 01/18/2017  5:45 PM   Phlebitis Assessment 0 01/18/2017  5:45 PM   Infiltration Assessment 0 01/18/2017  5:45 PM   Dressing Status Clean, dry, & intact 01/18/2017  5:45 PM        Medication list confirmed with patient    Opportunity for questions and clarification was provided.      Patient is oriented to time, place, person and situation none  Patient is  continent and ambulatory with assist     Valuables transported with patient     Patient transported with:   er tech

## 2017-01-18 NOTE — H&P (Signed)
Bridget Morton is a 61 y.o. female 3 weeks s/p robotic cholecystectomy by Dr Kristen Loader transferred from [facility] for evaluation. Patient states that she has been having fevers to 102 or 103 since her surgery. She has also had recurrent nausea.  She has seen Dr Walker Kehr several times in the office postop, however, no signs of infection noted there.  She notes she has had pain in the epigastrium and RUQ progressively over past week and nausea and her pain reached a level today that she called EMS for transport. She was seen in ED at [facility] where she was evaluated with labs sn CT sand was found to have a fluid collection in the gallbladder fossa. Transfer to [facility] was requested for continuing care and management.      Past Medical History:   Diagnosis Date   ??? Arthritis    ??? Chronic pain    ??? GERD (gastroesophageal reflux disease)    ??? H/O colonoscopy 2010    due 2020   ??? Hypertension    ??? Menopause          Past Surgical History:   Procedure Laterality Date   ??? HX APPENDECTOMY     ??? HX BREAST BIOPSY Left     Benign   ??? HX CESAREAN SECTION  1978, 1982   ??? HX GASTRIC BYPASS  2004   ??? HX GYN     ??? HX KNEE REPLACEMENT      bilateral   ??? HX LAP CHOLECYSTECTOMY  12/23/2016   ??? HX MOHS PROCEDURES      x 3 on right   ??? HX ORTHOPAEDIC      L4-L5 fusion   ??? HX OTHER SURGICAL      spinal cord stimulator   ??? HX PARTIAL HYSTERECTOMY  2000    supracervical   ??? HX TONSIL AND ADENOIDECTOMY      childhood       Current Facility-Administered Medications   Medication Dose Route Frequency Provider Last Rate Last Dose   ??? 0.9% sodium chloride infusion  100 mL/hr IntraVENous CONTINUOUS Burtis Junes, MD         Current Outpatient Prescriptions   Medication Sig Dispense Refill   ??? metoprolol succinate (TOPROL-XL) 100 mg tablet Take 1 Tab by mouth daily. 30 Tab 1   ??? oxyCODONE-acetaminophen (PERCOCET) 5-325 mg per tablet Take 1 Tab by mouth every four (4) hours as needed for Pain. Max Daily Amount: 6 Tabs. 24 Tab 0    ??? loratadine (CLARITIN) 10 mg tablet TK 1 T PO QD     ??? buPROPion SR (WELLBUTRIN SR) 150 mg SR tablet bupropion HCl SR 150 mg tablet,12 hr sustained-release     ??? lurasidone (LATUDA) 20 mg tab tablet Latuda 20 mg tablet     ??? levorphanol (LEVO-DROMORAN) 2 mg tablet Take 1 tablet every 8 hours by oral route as directed for 30 days.     ??? cyclobenzaprine (FLEXERIL) 10 mg tablet TK 1 T PO Q 8 H PRF MSP  1   ??? celecoxib (CELEBREX) 200 mg capsule Take 1 Cap by mouth daily. 30 Cap 2   ??? levothyroxine (SYNTHROID) 25 mcg tablet Take 1 Tab by mouth Daily (before breakfast). 30 Tab 3   ??? furosemide (LASIX) 40 mg tablet TAKE 1 TABLET BY MOUTH DAILY 30 Tab 0   ??? alendronate (FOSAMAX) 70 mg tablet Take 1 Tab by mouth every seven (7) days. 4 Tab 3   ???  pantoprazole (PROTONIX) 40 mg tablet TAKE 1 TABLET BY MOUTH DAILY 90 Tab 3       Allergies   Allergen Reactions   ??? Adhesive Tape-Silicones Rash   ??? Opana Er [Oxymorphone] Other (comments)     Delirium.     Patient states she is not allergic.       Social History     Social History   ??? Marital status: MARRIED     Spouse name: N/A   ??? Number of children: N/A   ??? Years of education: N/A     Occupational History   ??? Not on file.     Social History Main Topics   ??? Smoking status: Former Smoker     Years: 10.00     Types: Cigarettes     Quit date: 12/16/2010   ??? Smokeless tobacco: Never Used   ??? Alcohol use No      Comment: seldom   ??? Drug use: No   ??? Sexual activity: Not Currently     Partners: Male     Birth control/ protection: None      Comment: Hysterectomy     Other Topics Concern   ??? Not on file     Social History Narrative       Family history noncontributory      Review of Systems    ROS, positive where in bold:    General: fevers, chills, night sweats, fatigue, weight loss, weight gain    GI: as per HPI    Integumentary: dermatitis or abnormal moles    HEENT:  visual changes, vertigo, epistaxis, dysphagia, hoarseness     Cardiac: chest pain, palpitations, hypertension, edema,  varicosities    Resp:  cough, shortness of breath, wheezing, hemoptysis, snoring,  reactive airway disease    GU: hematuria, dysuria, frequency, urgency, nocturia, stress urinary incontinence    MSK: weakness, joint pain, arthritis    Endocrine: diabetes, thyroid disease, polyuria, polydipsia, polyphagia, poor wound healing, heat intolerance,cold intolerance    Lymph/Heme: anemia, bruising,  history of blood transfusions    Neuro: dizziness, headache, fainting, seizures, stroke    Psychiatry:  Anxiety, depression, psychosis         Objective:     Physical Exam:  Visit Vitals   ??? BP 152/71 (BP 1 Location: Left arm, BP Patient Position: At rest)   ??? Pulse 84   ??? Temp 98.7 ??F (37.1 ??C)   ??? Resp 13   ??? Ht  (1.549 m)   ??? Wt 95.3 kg (210 lb)   ??? SpO2 99%   ??? BMI 39.68 kg/m2       General: Well developed, well nourished 61 y.o. female in no acute distress  ENMT: normocephalic, atraumatic mouth:clear, no overt lesions, oral mucosa pink and moist  Neck: supple, no masses, no adenopathy or carotid bruits, trachea midline  Skin: warm, smooth, dry and well perfused  Respiratory: clear to auscultation bilaterally, no wheeze, rhonchi or rales, excursions normal and symmetrical  Cardiovascular: Regular rate and rhythm, no murmurs, clicks, gallops or rubs, no edema or varicosities  Gastrointestinal: soft, moderately tender RUQ/epigastrium, nondistended, normoactive bowel sounds, no hernias, no hepatosplenomegaly  Musculoskeletal: warm, well-perfused, no tenderness or swelling, normal gait/station  Neuro: sensation and strength grossly intact and symmetrical  Psych: alert and oriented to person, place and time    Labs from [place] reviewed, no leukocytosis, LFTS in normal limits.      Imaging:  CT report from [facility] reviewed: appears  to have fluid collection 5x2 cm in gallbladder fossa with mild inflammation of duodenum in proximity with hyperintense focus at center.     Assessment:   [name] Esther is a 61 y.o. female 3 weeks s/p robotic chole with Dr Walker Kehr with fluid collection at gallbladder fossa. This may be an abscess with a spilled stone.  Alternatively, radiologist mentions this may be a duodenal injury, however given her relatively benign abdomen, feel this is less likely (although could present subacutely as she is diverted due to her Roux en Y gastric bypass.  Plan:     -admit to inpatient service; IM to consult.   -will Notify Dr Kristen Loader of her admission in the morning.  -keep NPO, may benefit from percutaneous drain in am to diagnose and treat  -will hold off on antibiotics at this time to allow cultures to be obtained at time of percutaneous drainage

## 2017-01-18 NOTE — Consults (Signed)
Medicine Consult Note    Patient: Bridget Morton   Age:  61 y.o.  Consulting Physician: Tor Netters, MD  Reason for Consult:  Medical management.    Assessment/Plan     Surgical site fluid collection   - possible abscess   - surgery consulting   - holding abx per their recommendations   - IVF    HTN   - will start scheduled IV lopressor, low dose, with hold parameters   - bp med/s can be adjusted as necessary   - restart PO meds with ok with surgery    Hypothyroid   - can remain off until able to take po unless NPO >7 days    GERD   - IV protonix daily for now, po when able.        HPI:   Bridget Morton is a 61 y.o. female who c/o progressive abdominal pain and fever.  3 weeks ago underwent robotic chole by Dr. Kristen Loader, presented to [facility] whom then transferred to [place] for surgical complication.  She has been having pain, Nausea and fever .  States the fever has been going on since the surgery daily now only at night.  Pain got so bad it prompted her to go to ED.  Dr. Raford Pitcher saw patient and will inform Dr. Kristen Loader of his patient's arrival.  [name], GERD and hypothyroidism.      Past Medical History:  Past Medical History:   Diagnosis Date   ??? Arthritis    ??? Chronic pain    ??? GERD (gastroesophageal reflux disease)    ??? H/O colonoscopy 2010    due 2020   ??? Hypertension    ??? Menopause        Past Surgical History:  Past Surgical History:   Procedure Laterality Date   ??? HX APPENDECTOMY     ??? HX BREAST BIOPSY Left     Benign   ??? HX CESAREAN SECTION  1978, 1982   ??? HX GASTRIC BYPASS  2004   ??? HX GYN     ??? HX KNEE REPLACEMENT      bilateral   ??? HX LAP CHOLECYSTECTOMY  12/23/2016   ??? HX MOHS PROCEDURES      x 3 on right   ??? HX ORTHOPAEDIC      L4-L5 fusion   ??? HX OTHER SURGICAL      spinal cord stimulator   ??? HX PARTIAL HYSTERECTOMY  2000    supracervical   ??? HX TONSIL AND ADENOIDECTOMY      childhood       Family History:  Family History   Problem Relation Age of Onset   ??? Alcohol  abuse Mother    ??? Cancer Mother      breast   ??? Cancer Father      lung   ??? Diabetes Father    ??? Heart Disease Father    ??? Lung Disease Father    ??? Hypertension Father    ??? Cancer Brother      lung       Social History:  Social History     Social History   ??? Marital status: MARRIED     Spouse name: N/A   ??? Number of children: N/A   ??? Years of education: N/A     Social History Main Topics   ??? Smoking status: Former Smoker     Years: 10.00  Types: Cigarettes     Quit date: 12/16/2010   ??? Smokeless tobacco: Never Used   ??? Alcohol use No      Comment: seldom   ??? Drug use: No   ??? Sexual activity: Not Currently     Partners: Male     Birth control/ protection: None      Comment: Hysterectomy     Other Topics Concern   ??? None     Social History Narrative       Home Medications:  Prior to Admission medications    Medication Sig Start Date End Date Taking? Authorizing Provider   metoprolol succinate (TOPROL-XL) 100 mg tablet Take 1 Tab by mouth daily. 01/08/17   Jenene Slicker, MD   oxyCODONE-acetaminophen (PERCOCET) 5-325 mg per tablet Take 1 Tab by mouth every four (4) hours as needed for Pain. Max Daily Amount: 6 Tabs. 12/23/16   Bradly Chris, MD   loratadine (CLARITIN) 10 mg tablet TK 1 T PO QD    Historical Provider   buPROPion SR (WELLBUTRIN SR) 150 mg SR tablet bupropion HCl SR 150 mg tablet,12 hr sustained-release    Historical Provider   lurasidone (LATUDA) 20 mg tab tablet Latuda 20 mg tablet    Historical Provider   levorphanol (LEVO-DROMORAN) 2 mg tablet Take 1 tablet every 8 hours by oral route as directed for 30 days.    Historical Provider   cyclobenzaprine (FLEXERIL) 10 mg tablet TK 1 T PO Q 8 H PRF MSP 12/08/16   Historical Provider   celecoxib (CELEBREX) 200 mg capsule Take 1 Cap by mouth daily. 12/08/16   Jenene Slicker, MD   levothyroxine (SYNTHROID) 25 mcg tablet Take 1 Tab by mouth Daily (before breakfast). 12/08/16   Jenene Slicker, MD   furosemide (LASIX) 40 mg tablet TAKE 1 TABLET BY MOUTH  DAILY 11/11/16   Arneta Cliche, MD   alendronate (FOSAMAX) 70 mg tablet Take 1 Tab by mouth every seven (7) days. 09/30/16   Jenene Slicker, MD   pantoprazole (PROTONIX) 40 mg tablet TAKE 1 TABLET BY MOUTH DAILY 09/17/15   Daine Gravel, MD       Allergies:  Allergies   Allergen Reactions   ??? Adhesive Tape-Silicones Rash   ??? Opana Er [Oxymorphone] Other (comments)     Delirium.     Patient states she is not allergic.       Review of Systems - positive responses in bold type   Constitutional: Negative for fever, chills, diaphoresis and unexpected weight change.   HENT: Negative for ear pain, congestion, sore throat, rhinorrhea, drooling, trouble swallowing, neck pain and tinnitus.   Eyes: Negative for photophobia, pain, redness and visual disturbance.   Respiratory: negative for shortness of breath, cough, choking, chest tightness, wheezing or stridor.   Cardiovascular: Negative for chest pain, palpitations and leg swelling.   Gastrointestinal: Negative for nausea, vomiting, abdominal pain, diarrhea, constipation, blood in stool, abdominal distention and anal bleeding.   Genitourinary: Negative for dysuria, urgency, frequency, hematuria, flank pain and difficulty urinating.   Musculoskeletal: Negative for back pain and arthralgias.   Skin: Negative for color change, rash and wound.   Neurological: Negative for dizziness, seizures, syncope, speech difficulty, light-headedness or headaches.   Hematological: Does not bruise/bleed easily.   Psychiatric/Behavioral: Negative for suicidal ideas, hallucinations, behavioral problems, self-injury or agitation       Physical Exam:     Visit Vitals   ??? BP 149/75   ???  Pulse 87   ??? Temp 98.2 ??F (36.8 ??C)   ??? Resp 10   ??? Ht 5\' 1"  (1.549 m)   ??? Wt 95.3 kg (210 lb)   ??? SpO2 98%   ??? BMI 39.68 kg/m2       Physical Exam:  General appearance: alert, cooperative, no distress, appears stated age  Head: Normocephalic, without obvious abnormality, atraumatic  Neck: supple, trachea  midline  Lungs: clear to auscultation bilaterally  Heart: regular rate and rhythm, S1, S2 normal, no murmur, click, rub or gallop  Extremities: extremities normal, atraumatic, no cyanosis or edema  Skin: Skin color, texture, turgor normal. No rashes or lesions  Neurologic: Grossly normal    Intake and Output:  Current Shift:     Last three shifts:       Lab/Data Reviewed:  CMP: No results found for: NA, K, CL, CO2, AGAP, GLU, BUN, CREA, GFRAA, GFRNA, CA, MG, PHOS, ALB, TBIL, TP, ALB, GLOB, AGRAT, SGOT, ALT, GPT  CBC: No results found for: WBC, HGB, HGBEXT, HCT, HCTEXT, PLT, PLTEXT, HGBEXT, HCTEXT, PLTEXT  All Cardiac Markers in the last 24 hours: No results found for: CPK, CK, CKMMB, CKMB, RCK3, CKMBT, CKNDX, CKND1, MYO, TROPT, TROIQ, TROI, TROPT, TNIPOC, BNP, BNPP    Medications Reviewed.      Gevena Barre, MD    January 18, 2017

## 2017-01-19 ENCOUNTER — Encounter: Attending: Internal Medicine | Primary: Internal Medicine

## 2017-01-19 LAB — TYPE AND SCREEN
ABO/Rh: A POS
Antibody Screen: NEGATIVE

## 2017-01-19 LAB — TYPE & SCREEN
ABO/Rh(D): A POS
Antibody screen: NEGATIVE

## 2017-01-19 LAB — METABOLIC PANEL, BASIC
Anion gap: 9 mmol/L (ref 3.0–18)
BUN/Creatinine ratio: 11 — ABNORMAL LOW (ref 12–20)
BUN: 6 MG/DL — ABNORMAL LOW (ref 7.0–18)
CO2: 28 mmol/L (ref 21–32)
Calcium: 8.1 MG/DL — ABNORMAL LOW (ref 8.5–10.1)
Chloride: 107 mmol/L (ref 100–108)
Creatinine: 0.55 MG/DL — ABNORMAL LOW (ref 0.6–1.3)
GFR est AA: >60 mL/min/{1.73_m2} (ref >60)
GFR est non-AA: >60 mL/min/{1.73_m2} (ref >60)
Glucose: 86 mg/dL (ref 74–99)
Potassium: 3.1 mmol/L — ABNORMAL LOW (ref 3.5–5.5)
Sodium: 144 mmol/L (ref 136–145)

## 2017-01-19 LAB — CBC W/O DIFF
HCT: 28.9 % — ABNORMAL LOW (ref 35.0–45.0)
HGB: 9.2 g/dL — ABNORMAL LOW (ref 12.0–16.0)
MCH: 24.9 PG (ref 24.0–34.0)
MCHC: 31.8 g/dL (ref 31.0–37.0)
MCV: 78.1 FL (ref 74.0–97.0)
MPV: 8.6 FL — ABNORMAL LOW (ref 9.2–11.8)
PLATELET: 310 10*3/uL (ref 135–420)
RBC: 3.7 M/uL — ABNORMAL LOW (ref 4.20–5.30)
RDW: 13.6 % (ref 11.6–14.5)
WBC: 3.6 10*3/uL — ABNORMAL LOW (ref 4.6–13.2)

## 2017-01-19 LAB — MAGNESIUM: Magnesium: 2.1 mg/dL (ref 1.6–2.6)

## 2017-01-19 MED ORDER — SODIUM CHLORIDE 0.9 % IJ SYRG
Freq: Three times a day (TID) | INTRAMUSCULAR | Status: DC
Start: 2017-01-19 — End: 2017-01-20
  Administered 2017-01-19 – 2017-01-20 (×4): via INTRAVENOUS

## 2017-01-19 MED ORDER — POTASSIUM CHLORIDE 10 MEQ/100 ML IV PIGGY BACK
10 mEq/0 mL | Freq: Once | INTRAVENOUS | Status: AC
Start: 2017-01-19 — End: 2017-01-19
  Administered 2017-01-19: 21:00:00 via INTRAVENOUS

## 2017-01-19 MED ORDER — ENOXAPARIN 40 MG/0.4 ML SUB-Q SYRINGE
40 mg/0.4 mL | SUBCUTANEOUS | Status: DC
Start: 2017-01-19 — End: 2017-01-18

## 2017-01-19 MED ORDER — SODIUM CHLORIDE 0.9 % IJ SYRG
INTRAMUSCULAR | Status: DC | PRN
Start: 2017-01-19 — End: 2017-01-20

## 2017-01-19 MED ORDER — POTASSIUM CHLORIDE 10 MEQ/100 ML IV PIGGY BACK
10 mEq/0 mL | Freq: Once | INTRAVENOUS | Status: AC
Start: 2017-01-19 — End: 2017-01-19
  Administered 2017-01-19: 19:00:00 via INTRAVENOUS

## 2017-01-19 MED ORDER — MORPHINE 2 MG/ML INJECTION
2 mg/mL | INTRAMUSCULAR | Status: DC | PRN
Start: 2017-01-19 — End: 2017-01-20
  Administered 2017-01-19 – 2017-01-20 (×10): via INTRAVENOUS

## 2017-01-19 MED ORDER — LEVOTHYROXINE 25 MCG TAB
25 mcg | ORAL | Status: DC
Start: 2017-01-19 — End: 2017-01-20
  Administered 2017-01-19 – 2017-01-20 (×2): via ORAL

## 2017-01-19 MED ORDER — POTASSIUM CHLORIDE 10 MEQ/100 ML IV PIGGY BACK
10 mEq/0 mL | INTRAVENOUS | Status: AC
Start: 2017-01-19 — End: 2017-01-19
  Administered 2017-01-19 (×4): via INTRAVENOUS

## 2017-01-19 MED FILL — MORPHINE 2 MG/ML INJECTION: 2 mg/mL | INTRAMUSCULAR | Qty: 1

## 2017-01-19 MED FILL — ONDANSETRON (PF) 4 MG/2 ML INJECTION: 4 mg/2 mL | INTRAMUSCULAR | Qty: 2

## 2017-01-19 MED FILL — PROTONIX 40 MG INTRAVENOUS SOLUTION: 40 mg | INTRAVENOUS | Qty: 40

## 2017-01-19 MED FILL — POTASSIUM CHLORIDE 10 MEQ/100 ML IV PIGGY BACK: 10 mEq/0 mL | INTRAVENOUS | Qty: 100

## 2017-01-19 MED FILL — METOPROLOL TARTRATE 5 MG/5 ML IV SOLN: 5 mg/ mL | INTRAVENOUS | Qty: 5

## 2017-01-19 MED FILL — SYNTHROID 25 MCG TABLET: 25 mcg | ORAL | Qty: 1

## 2017-01-19 MED FILL — BD POSIFLUSH NORMAL SALINE 0.9 % INJECTION SYRINGE: INTRAMUSCULAR | Qty: 10

## 2017-01-19 NOTE — Progress Notes (Signed)
[id number]: Access patient chart to medicate patient for pain.HIPPA maintained.

## 2017-01-19 NOTE — Progress Notes (Signed)
Inpatient Daily Progress Note    Subjective:    Patient feeling about the same. Continues to have vague abdominal pain and some nausea. No emesis.  No diarrhea, no constipation    Objective:     Physical Exam:  Visit Vitals   ??? BP 118/74 (BP 1 Location: Left arm, BP Patient Position: At rest)   ??? Pulse 76   ??? Temp 98 ??F (36.7 ??C)   ??? Resp 16   ??? Ht 5\' 1"  (1.549 m)   ??? Wt 95.3 kg (210 lb)   ??? SpO2 95%   ??? BMI 39.68 kg/m2       Gen: Well developed, well nourished 61 y.o. female in no acute distress  Resp: clear to auscultation bilaterally, no wheezes   Cardio: Regular rate and rhythm, no murmurs, clicks, gallops or rubs, no edema  Abdomen: soft, minimally tender, nondistended, active bowel sounds, no hernias  Psych: alert and oriented to person, place and time      Recent Results (from the past 12 hour(s))   METABOLIC PANEL, BASIC    Collection Time: 01/19/17  5:15 AM   Result Value Ref Range    Sodium 144 136 - 145 mmol/L    Potassium 3.1 (L) 3.5 - 5.5 mmol/L    Chloride 107 100 - 108 mmol/L    CO2 28 21 - 32 mmol/L    Anion gap 9 3.0 - 18 mmol/L    Glucose 86 74 - 99 mg/dL    BUN 6 (L) 7.0 - 18 MG/DL    Creatinine 6.06 (L) 0.6 - 1.3 MG/DL    BUN/Creatinine ratio 11 (L) 12 - 20      GFR est AA >60 >60 ml/min/1.18m2    GFR est non-AA >60 >60 ml/min/1.68m2    Calcium 8.1 (L) 8.5 - 10.1 MG/DL   CBC W/O DIFF    Collection Time: 01/19/17  5:15 AM   Result Value Ref Range    WBC 3.6 (L) 4.6 - 13.2 K/uL    RBC 3.70 (L) 4.20 - 5.30 M/uL    HGB 9.2 (L) 12.0 - 16.0 g/dL    HCT 30.1 (L) 60.1 - 45.0 %    MCV 78.1 74.0 - 97.0 FL    MCH 24.9 24.0 - 34.0 PG    MCHC 31.8 31.0 - 37.0 g/dL    RDW 09.3 23.5 - 57.3 %    PLATELET 310 135 - 420 K/uL    MPV 8.6 (L) 9.2 - 11.8 FL   MAGNESIUM    Collection Time: 01/19/17  5:15 AM   Result Value Ref Range    Magnesium 2.1 1.6 - 2.6 mg/dL       Assessment:     Bridget Morton is a 61 y.o. female s/p robotic chole by Dr Walker Kehr,  admitted for fluid collection in gallbladder fossa and nausea. No evidence of infection, no fever/chills, leukocytosis etc.  Discussed with radiologist who does not feel this is consistent with abscess, but rather resolving hematoma or similar and is difficult to access to drain. As such, no indication for intervention at this time.       Plan:     -advance to clear liquids, if tolerates, may advance further tomorrow  -Dr Noralee Space to return tomorw and resume care of his patient

## 2017-01-19 NOTE — Other (Signed)
Bedside and Verbal shift change report given to Shanda Bumps, Charity fundraiser (Cabin crew) by Diannia Ruder, RN (offgoing nurse). Report included the following information SBAR, Kardex, Procedure Summary, MAR, Accordion and Med Rec Status.

## 2017-01-19 NOTE — Progress Notes (Signed)
Problem: Falls - Risk of  Goal: *Absence of Falls  Document Schmid Fall Risk and appropriate interventions in the flowsheet.   Outcome: Progressing Towards Goal  Fall Risk Interventions:  Mobility Interventions: Bed/chair exit alarm         Medication Interventions: Assess postural VS orthostatic hypotension

## 2017-01-19 NOTE — Progress Notes (Addendum)
[facility]  Hospitalist Division           Inpatient Daily Progress Note        Patient: Bridget Morton MRN: [id number]  CSN: [id number]    Date of Birth: 03/11/1956  Age: 61 y.o.  Sex: female    DOA: 01/18/2017 LOS:  LOS: 1 day                    Chief Complaint: Surgical site infection     Interval History:    Bridget Morton is a 61 y.o. female who c/o progressive abdominal pain and fever. 3 weeks ago underwent robotic chole by Dr. Kristen Loader, presented to [facility] whom then transferred to [facility] for surgical complication. She had been having pain, nausea and fever. Stated the fever had been going on since surgery daily and then only at night.  Pain got so bad it prompted her to go to ED.  Patient was evaluated by Dr. Raford Pitcher who notified Dr. Walker Kehr of patient's arrival. Hospitalist Service consulted for HTN, GERD and hypothyroidism. Patient admitted for further management. CT Abd[facility]Pelvis at [facility] showed There is a fluid collection in the gallbladder bed which is surrounded by a layer of apparent induration or granulation tissue. A higher attenuation ovoid structure 7 mm in diameter within this fluid space is suspicious for a retained calculus (versus a localized clot). Inflammatory process extends from the gallbladder fossa to the duodenal bulb which is also edematous. A secondary possibility for the etiology of the process is a penetrating duodenal ulcer with localized collection (felt to be less likely). Surgery ordered for IR drainage of abscess.     Subjective:      C/o RUQ pain. Denies nausea or vomiting     Objective:      Visit Vitals   ??? BP 116/72 (BP 1 Location: Right arm, BP Patient Position: At rest)   ??? Pulse 83   ??? Temp 98 ??F (36.7 ??C)   ??? Resp 16   ??? Ht  (1.549 m)   ??? Wt 95.3 kg (210 lb)   ??? SpO2 96%   ??? BMI 39.68 kg/m2       Physical Exam:  General appearance: alert, cooperative, no distress, appears stated age   Lungs: clear to auscultation bilaterally  Heart: regular rate and rhythm, S1, S2 normal, no murmur, click, rub or gallop  Abdomen: RUQ TTP. Normoactive bowel sounds  Extremities: extremities normal, atraumatic, no cyanosis or edema  Skin: Skin color, texture, turgor normal. No rashes or lesions  Neurologic: Grossly normal  PSY: Mood and affect normal, appropriately behaved      Intake and Output:  Current Shift:     Last three shifts:  09/22 1901 - 09/24 0700  In: -   Out: 1 [Urine:1]    Recent Results (from the past 24 hour(s))   TYPE & SCREEN    Collection Time: 01/18/17  6:15 PM   Result Value Ref Range    Crossmatch Expiration 01/21/2017     ABO/Rh(D) A POSITIVE     Antibody screen NEG    PROTHROMBIN TIME + INR    Collection Time: 01/18/17  6:17 PM   Result Value Ref Range    Prothrombin time 12.4 11.5 - 15.2 sec    INR 1.0 0.8 - 1.2     PTT    Collection Time: 01/18/17  6:17 PM   Result Value Ref Range    aPTT 48.1 (  H) 23.0 - 36.4 SEC   METABOLIC PANEL, BASIC    Collection Time: 01/19/17  5:15 AM   Result Value Ref Range    Sodium 144 136 - 145 mmol/L    Potassium 3.1 (L) 3.5 - 5.5 mmol/L    Chloride 107 100 - 108 mmol/L    CO2 28 21 - 32 mmol/L    Anion gap 9 3.0 - 18 mmol/L    Glucose 86 74 - 99 mg/dL    BUN 6 (L) 7.0 - 18 MG/DL    Creatinine 1.61 (L) 0.6 - 1.3 MG/DL    BUN/Creatinine ratio 11 (L) 12 - 20      GFR est AA >60 >60 ml/min/1.1m2    GFR est non-AA >60 >60 ml/min/1.70m2    Calcium 8.1 (L) 8.5 - 10.1 MG/DL   CBC W/O DIFF    Collection Time: 01/19/17  5:15 AM   Result Value Ref Range    WBC 3.6 (L) 4.6 - 13.2 K/uL    RBC 3.70 (L) 4.20 - 5.30 M/uL    HGB 9.2 (L) 12.0 - 16.0 g/dL    HCT 09.6 (L) 04.5 - 45.0 %    MCV 78.1 74.0 - 97.0 FL    MCH 24.9 24.0 - 34.0 PG    MCHC 31.8 31.0 - 37.0 g/dL    RDW 40.9 81.1 - 91.4 %    PLATELET 310 135 - 420 K/uL    MPV 8.6 (L) 9.2 - 11.8 FL           Lab Results   Component Value Date/Time    Glucose 86 01/19/2017 05:15 AM    Glucose 93 05/20/2016 01:47 PM     Glucose 76 11/15/2014 10:14 AM    Glucose 87 10/20/2013 02:17 PM    Glucose 77 07/26/2012 12:00 AM        Assessment/Plan:     Patient Active Problem List   Diagnosis Code   ??? Internal hemorrhoid K64.8   ??? OA (osteoarthritis) M19.90   ??? Edema R60.9   ??? Adiposis dolorosa E88.2   ??? Chronic pain G89.29   ??? Iron deficiency E61.1   ??? Fibromyalgia M79.7   ??? Insomnia G47.00   ??? Daytime somnolence R40.0   ??? Diastolic dysfunction I51.9   ??? Chronic constipation K59.09   ??? Dry eyes, bilateral H04.123   ??? Leg cramps R25.2   ??? Dysphagia, oropharyngeal phase R13.12   ??? Depression F32.9   ??? H/O gastric bypass Z98.84   ??? Primary osteoarthritis involving multiple joints M15.0   ??? Lumbar post-laminectomy syndrome M96.1   ??? Cervical radiculopathy M54.12   ??? Lumbar radiculopathy M54.16   ??? Non-restorative sleep G47.8   ??? Fatigue R53.83   ??? 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 Chronic midline low back pain M54.5, G89.29   ??? Obesity, morbid (HCC) E66.01   ??? Chiari malformation type I (HCC) G93.5   ??? H/O: hysterectomy Z90.710   ??? Essential hypertension I10   ??? Leg swelling M79.89   ??? Delusions of parasitosis (HCC) F22   ??? Osteoporosis M81.0   ??? Fatty liver K76.0   ??? Fluid collection at surgical site T88.8XXA   ??? GERD (gastroesophageal reflux disease) K21.9   ??? Acquired hypothyroidism E03.9       A/P:  Surgical site fluid collection  - CT Abd/Pelvis [facility] showed there is a fluid collection in the gallbladder bed which is surrounded by a layer of apparent induration or granulation  tissue. A higher attenuation ovoid structure 7 mm in diameter within this fluid space is suspicious for a retained calculus (versus a localized clot). Inflammatory process extends from the gallbladder fossa to the duodenal bulb which is also edematous. A secondary possibility for the etiology of the process is a penetrating duodenal ulcer with localized collection (felt to be less likely).  - Surgery following- abx at Surgery's discretion   - IVF   - Afebrile overnight   - Antibiotics per Surgery  - IR drainage of abscess   ??  HTN  - will start scheduled IV lopressor, low dose, with hold parameters  - restart PO meds with ok with surgery  ??  Hypothyroid  - Levothyroxine     Hypokalemia  - Replete     GERD  - IV protonix daily for now  - transition to PO when able     DVT Prophylaxis  Talbot Grumbling     Humza Tallerico, FNP-BC  [facility]  Hospitalist Division  Pager:  [phone]  Office:  [phone]

## 2017-01-19 NOTE — Progress Notes (Signed)
Physical Exam   Skin:

## 2017-01-19 NOTE — Progress Notes (Addendum)
1910:  Bedside report received from Rosezella Florida, RN  Patient in bed.  No signs of distress.  No signs of distress.  Complaints of pain 4/10.  Call bell within reach.  Will continue to monitor.    Bedside shift change report given to Camillia Herter, Charity fundraiser (Cabin crew) by Shanda Bumps, RN (offgoing nurse). Report included the following information SBAR, MAR, Recent Results and Med Rec Status.

## 2017-01-19 NOTE — Progress Notes (Signed)
Chaplain conducted an initial consultation and Spiritual Assessment for Express ScriptsJanet Mize Morton, who is a 61 y.o.,female. Patient???s Primary Language is: [place].   According to the patient???s EMR Religious Affiliation is: Protestant.     The reason the Patient came to the hospital is:   Patient Active Problem List    Diagnosis Date Noted   ??? Fluid collection at surgical site 01/18/2017   ??? GERD (gastroesophageal reflux disease) 01/18/2017   ??? Acquired hypothyroidism 01/18/2017   ??? Fatty liver 12/08/2016   ??? Osteoporosis 09/30/2016   ??? Delusions of parasitosis (HCC) 05/27/2016   ??? Essential hypertension 04/09/2016   ??? Leg swelling 04/09/2016   ??? Obesity, morbid (HCC) 03/24/2016   ??? Chiari malformation type I (HCC) 03/24/2016   ??? H/O: hysterectomy 03/24/2016   ??? Chronic midline low back pain 03/28/2015   ??? 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 Primary osteoarthritis involving multiple joints 05/08/2014   ??? Lumbar post-laminectomy syndrome 05/08/2014   ??? Cervical radiculopathy 05/08/2014   ??? Lumbar radiculopathy 05/08/2014   ??? Non-restorative sleep 05/08/2014   ??? Fatigue 05/08/2014   ??? H/O gastric bypass 03/03/2014   ??? Depression 10/20/2013   ??? Dysphagia, oropharyngeal phase 05/18/2013   ??? Chronic constipation 07/26/2012   ??? Dry eyes, bilateral 07/26/2012   ??? Leg cramps 07/26/2012   ??? Diastolic dysfunction 06/14/2012   ??? Daytime somnolence 06/02/2012   ??? Insomnia 05/11/2012   ??? Iron deficiency 01/14/2012   ??? Fibromyalgia 01/14/2012   ??? Internal hemorrhoid 12/16/2011   ??? OA (osteoarthritis) 12/16/2011   ??? Edema 12/16/2011   ??? 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 Chronic pain 12/16/2011        The Chaplain provided the following Interventions:  Initiated a relationship of care and support with patient in room 2210 today.  Listened empathically as patient talked about how bad she was feeling at the moment and how much she hated the tube in her nose.  She also talked of her fears of possible surgery that may still have to  come for her stomach.  Provided information about Spiritual Care Services.  Offered prayer and assurance of continued prayers on patients behalf.       The following outcomes were achieved:  Patient shared limited information about heremedical narrative.  Patient processed feeling about current hospitalization.  Patient expressed gratitude for pastoral care visit.    Assessment:  Patient does not have any religious/cultural needs that will affect patient???s preferences in health care.  There are no further spiritual or religious issues which require Spiritual Care Services interventions at this time.       Plan:  Chaplains will continue to follow and will provide pastoral care on an as needed/requested basis    .Dewaine Oatshaplain Ernie Etheridge   Board Certified Chaplain   Spiritual Care   [phone]

## 2017-01-20 ENCOUNTER — Encounter

## 2017-01-20 MED FILL — METOPROLOL TARTRATE 5 MG/5 ML IV SOLN: 5 mg/ mL | INTRAVENOUS | Qty: 5

## 2017-01-20 MED FILL — SYNTHROID 25 MCG TABLET: 25 mcg | ORAL | Qty: 1

## 2017-01-20 MED FILL — MORPHINE 2 MG/ML INJECTION: 2 mg/mL | INTRAMUSCULAR | Qty: 1

## 2017-01-20 NOTE — Progress Notes (Cosign Needed)
Late Entry from Yesterday 01/19/17   Bridget CahillSusan Ross, RN Care Management Signed  Progress Notes Date of Service: 01/19/17 1754         [] Hide copied text  [] Hover for attribution information  Chart reviewed and patient verified demographics. She has [employer] [employer] [employer] [facility] plus Medicare part A ( due to disability of degeneration of spine) Dr Danella Pentoneshmukh is PCP, last seen in August. She has a rollator in the room and has has both Home Health and out pt PT in hte past for her back surgeries. Her husband Bridget NeedleMichael Huston Foley( Brady) Morton cell # [phone] lives with her and will drive her home and participate in dc process. She says she had GallBladder surgery here at end of August with Dr Kristen LoaderYouseff and that she did not feel well since then. She says she had a lot of nausea, some fever, and that she would get sweaty when she tried to eat. [facility] and [facility] and then transferred her here. She states her test show a fluid collection and that that Dr Raford PitcherBarrett is watching  Overnight.   Reason for Admission:   fluis collection at surgical site  ????  RRAT Score:   13    ????  Do you (patient/family) have any concerns for transition/discharge?  no     ????  Plan for utilizing home health:   Not at this time    ??  Likelihood of readmission?   Unsure, pt with moderate risk for continued issues, no procedure done, Symptom may persist.  ????  Transition of Care Plan:   home     ??      ?? ??

## 2017-01-20 NOTE — Discharge Summary (Signed)
General Surgery Discharge Note    Admission Date: 01/18/2017    Discharge Date: 01/20/2017    Admission Diagnosis:  Nausea and abdominal pain.     Discharge Diagnosis:  Nausea and abdominal pain.     Procedures Performed:   None.     Hospital Course:  Patient was admitted on 01/18/2017 for nausea and abdominal pain. She is 3 weeks S/P robotic cholecystectomy. Patient admitted to the floor, monitored as per protocol.  Diet sequentially advanced. No reason was identified for her nausea and pain.  At the time of discharge the patient is afebrile, vital signs stable,  tolerating a diet, voiding spontaneously, ambulatory with adequate pain control with oral medications and clear surgical sites without evidence of infection.    Condition on Discharge:  Stable    Follow-up care :  Follow-up in 2weeks in the clinic (Discahrge instructions provided)    Disposition:  HOme.    Discharge Medications:      Current Facility-Administered Medications   Medication Dose Route Frequency   ??? 0.9% sodium chloride infusion  100 mL/hr IntraVENous CONTINUOUS   ??? ondansetron (ZOFRAN) injection 4 mg  4 mg IntraVENous Q4H PRN   ??? pantoprazole (PROTONIX) 40 mg in sodium chloride 0.9% 10 mL injection  40 mg IntraVENous DAILY   ??? metoprolol (LOPRESSOR) injection 2.5 mg  2.5 mg IntraVENous Q6H   ??? morphine injection 2 mg  2 mg IntraVENous Q3H PRN   ??? sodium chloride (NS) flush 5-10 mL  5-10 mL IntraVENous Q8H   ??? sodium chloride (NS) flush 5-10 mL  5-10 mL IntraVENous PRN   ??? levothyroxine (SYNTHROID) tablet 25 mcg  25 mcg Oral 6am       Local wound care with daily showers, keep wounds clean and dry    Activity: as desired, no lifting greater than 15lbs or situps for 30 days    Special Instructions:   No driving until activity is not influenced by incisional pain and off narcotics   No bath or hot tub until wounds are healed   Notify [facility] for a fever>101, redness or foul-smelling  drainage from incision, or worsening pain.    Followup with surgeon in 10-14 days.

## 2017-01-20 NOTE — Progress Notes (Signed)
Dr Walker KehrYoussef has returned and will resume care of his patient. I will transfer the care of this patient to him and remain available as needed.

## 2017-01-20 NOTE — Progress Notes (Signed)
Problem: Falls - Risk of  Goal: *Absence of Falls  Document Schmid Fall Risk and appropriate interventions in the flowsheet.   Outcome: Progressing Towards Goal  Fall Risk Interventions:  Mobility Interventions: Patient to call before getting OOB, Communicate number of staff needed for ambulation/transfer         Medication Interventions: Patient to call before getting OOB, Teach patient to arise slowly, Evaluate medications/consider consulting pharmacy         History of Falls Interventions: Room close to nurse's station, Evaluate medications/consider consulting pharmacy

## 2017-01-20 NOTE — Progress Notes (Addendum)
Physical Exam     Bedside and Verbal shift change report given to Camillia Herter, Charity fundraiser (Cabin crew) by Shanda Bumps, RN (offgoing nurse). Report included the following information SBAR, Kardex, Intake/Output and MAR. Pt stating she's in pain but there is no PO prn order. Pt has d/c order. Will notify Dr. Walker Kehr.    1610 - LM for Dr. Walker Kehr to call floor regarding pt pain.     0900 - I have reviewed discharge instructions with the patient and spouse.  The patient and spouse verbalized understanding.

## 2017-01-20 NOTE — Progress Notes (Signed)
Care Management Interventions  PCP Verified by CM: Yes (seen in august)  Palliative Care Criteria Met (RRAT>21 & CHF Dx)?: No  Mode of Transport at Discharge: Other (see comment) (husband)  Transition of Care Consult (CM Consult): Discharge Planning  MyChart Signup: No  Discharge Durable Medical Equipment: No  Physical Therapy Consult: No  Occupational Therapy Consult: No  Speech Therapy Consult: No  Current Support Network: Lives with Spouse  Confirm Follow Up Transport: Family  Plan discussed with Pt/Family/Caregiver: Yes  Discharge Location  Discharge Placement: Home

## 2017-01-20 NOTE — Progress Notes (Signed)
Patient seen and examined.  Doing well. Has mild nausea. No abdominal pain but back pain (Chronic).  Abdomen is soft and non-tender.    Plan:  Discharge home now  I told the patient to follow up with PCP to get GI consult for the need for endoscopy.  Follow up with me as needed.

## 2017-01-20 NOTE — Discharge Summary (Signed)
General Surgery Discharge Note    Admission Date: 01/18/2017    Discharge Date: 01/20/2017    Admission Diagnosis:  Nausea and abdominal pain.     Discharge Diagnosis:  Nausea and abdominal pain.     Procedures Performed:   None.     Hospital Course:  Patient was admitted on 01/18/2017 for nausea and abdominal pain. She is 3 weeks S/P robotic cholecystectomy. Patient admitted to the floor, monitored as per protocol.  Diet sequentially advanced. No reason was identified for her nausea and pain.  At the time of discharge the patient is afebrile, vital signs stable,  tolerating a diet, voiding spontaneously, ambulatory with adequate pain control with oral medications and clear surgical sites without evidence of infection.    Condition on Discharge:  Stable    Follow-up care :  Follow-up in 2weeks in the clinic (Discahrge instructions provided)    Disposition:  HOme.    Discharge Medications:      Current Facility-Administered Medications   Medication Dose Route Frequency   ??? 0.9% sodium chloride infusion  100 mL/hr IntraVENous CONTINUOUS   ??? ondansetron (ZOFRAN) injection 4 mg  4 mg IntraVENous Q4H PRN   ??? pantoprazole (PROTONIX) 40 mg in sodium chloride 0.9% 10 mL injection  40 mg IntraVENous DAILY   ??? metoprolol (LOPRESSOR) injection 2.5 mg  2.5 mg IntraVENous Q6H   ??? morphine injection 2 mg  2 mg IntraVENous Q3H PRN   ??? sodium chloride (NS) flush 5-10 mL  5-10 mL IntraVENous Q8H   ??? sodium chloride (NS) flush 5-10 mL  5-10 mL IntraVENous PRN   ??? levothyroxine (SYNTHROID) tablet 25 mcg  25 mcg Oral 6am       Local wound care with daily showers, keep wounds clean and dry    Activity: as desired, no lifting greater than 15lbs or situps for 30 days    Special Instructions:   No driving until activity is not influenced by incisional pain and off narcotics   No bath or hot tub until wounds are healed   Notify [facility] for a fever>101, redness or foul-smelling  drainage from incision, or worsening  pain.   Followup with surgeon in 10-14 days.

## 2017-01-22 ENCOUNTER — Inpatient Hospital Stay: Admit: 2017-01-22 | Payer: PRIVATE HEALTH INSURANCE | Primary: Internal Medicine

## 2017-01-22 ENCOUNTER — Encounter

## 2017-01-22 ENCOUNTER — Ambulatory Visit
Admit: 2017-01-22 | Discharge: 2017-01-22 | Payer: PRIVATE HEALTH INSURANCE | Attending: Internal Medicine | Primary: Internal Medicine

## 2017-01-22 DIAGNOSIS — R110 Nausea: Secondary | ICD-10-CM

## 2017-01-22 LAB — CBC WITH AUTOMATED DIFF
ABS. BASOPHILS: 0 10*3/uL (ref 0.0–0.1)
ABS. EOSINOPHILS: 0 10*3/uL (ref 0.0–0.4)
ABS. LYMPHOCYTES: 1.3 10*3/uL (ref 0.9–3.6)
ABS. MONOCYTES: 0.4 10*3/uL (ref 0.05–1.2)
ABS. NEUTROPHILS: 5 10*3/uL (ref 1.8–8.0)
BASOPHILS: 0 % (ref 0–2)
EOSINOPHILS: 0 % (ref 0–5)
HCT: 34.1 % — ABNORMAL LOW (ref 35.0–45.0)
HGB: 10.8 g/dL — ABNORMAL LOW (ref 12.0–16.0)
LYMPHOCYTES: 20 % — ABNORMAL LOW (ref 21–52)
MCH: 25.1 PG (ref 24.0–34.0)
MCHC: 31.7 g/dL (ref 31.0–37.0)
MCV: 79.1 FL (ref 74.0–97.0)
MONOCYTES: 5 % (ref 3–10)
MPV: 9.2 FL (ref 9.2–11.8)
NEUTROPHILS: 75 % — ABNORMAL HIGH (ref 40–73)
PLATELET: 337 10*3/uL (ref 135–420)
RBC: 4.31 M/uL (ref 4.20–5.30)
RDW: 14.6 % — ABNORMAL HIGH (ref 11.6–14.5)
WBC: 6.7 10*3/uL (ref 4.6–13.2)

## 2017-01-22 LAB — METABOLIC PANEL, COMPREHENSIVE
A-G Ratio: 0.9 (ref 0.8–1.7)
ALT (SGPT): 12 U/L — ABNORMAL LOW (ref 13–56)
AST (SGOT): 12 U/L — ABNORMAL LOW (ref 15–37)
Albumin: 3.3 g/dL — ABNORMAL LOW (ref 3.4–5.0)
Alk. phosphatase: 135 U/L — ABNORMAL HIGH (ref 45–117)
Anion gap: 8 mmol/L (ref 3.0–18)
BUN/Creatinine ratio: 14 (ref 12–20)
BUN: 11 MG/DL (ref 7.0–18)
Bilirubin, total: 0.3 MG/DL (ref 0.2–1.0)
CO2: 31 mmol/L (ref 21–32)
Calcium: 8.2 MG/DL — ABNORMAL LOW (ref 8.5–10.1)
Chloride: 103 mmol/L (ref 100–108)
Creatinine: 0.76 MG/DL (ref 0.6–1.3)
GFR est AA: >60 mL/min/{1.73_m2} (ref >60)
GFR est non-AA: >60 mL/min/{1.73_m2} (ref >60)
Globulin: 3.6 g/dL (ref 2.0–4.0)
Glucose: 89 mg/dL (ref 74–99)
Potassium: 3.7 mmol/L (ref 3.5–5.5)
Protein, total: 6.9 g/dL (ref 6.4–8.2)
Sodium: 142 mmol/L (ref 136–145)

## 2017-01-22 LAB — AMYLASE: Amylase: 16 U/L — ABNORMAL LOW (ref 25–115)

## 2017-01-22 LAB — LIPASE: Lipase: 57 U/L — ABNORMAL LOW (ref 73–393)

## 2017-01-22 MED ORDER — FUROSEMIDE 40 MG TAB
40 mg | ORAL_TABLET | Freq: Every day | ORAL | 0 refills | Status: DC
Start: 2017-01-22 — End: 2017-03-11

## 2017-01-22 MED ORDER — PANTOPRAZOLE 40 MG TAB, DELAYED RELEASE
40 mg | ORAL_TABLET | Freq: Every day | ORAL | 3 refills | Status: AC
Start: 2017-01-22 — End: ?

## 2017-01-22 NOTE — Progress Notes (Signed)
1. Have you been to the ER, urgent care clinic since your last visit?  Hospitalized since your last visit?Galbladder removal    2. Have you seen or consulted any other health care providers outside of the [facility] since your last visit?  Include any pap smears or colon screening. No      Pt states she has been having bm's that look lime green.

## 2017-01-22 NOTE — Progress Notes (Signed)
Bridget Morton is a 61 y.o.  female and presents with     Chief Complaint   Patient presents with   ??? Nausea   ??? Weight Loss   ??? Abdominal Pain   ??? Hospital Follow Up       Pt says she had gallbladder surgery on August 28th.Pt was seen by surgery postop for fevers post op but there was apparently on sign of infection.  Pt went to [facility] on Sept 23rd a[facility]ree pt was transferred from [place].  At [facility] it was felt that there might be some fluid collection and even possibility of stone in CBD.Pt was admitted and monitored for abdominal pain and vomiting.Pt was discharged on the 25th.  Pt is here today for follow up . Pt says she still has abdominal pain, nausea and some wt loss.  She does not have great appetite.  She also has diarrhea.  It was felt that pt may need to see GI for abdominal pain.She may have gastritis or duodenal ulcer.  Pt has mild diarrhea but no fever or chills.                Past Medical History:   Diagnosis Date   ??? Arthritis    ??? Chronic pain    ??? GERD (gastroesophageal reflux disease)    ??? H/O colonoscopy 2010    due 2020   ??? Hypertension    ??? Menopause      Past Surgical History:   Procedure Laterality Date   ??? HX APPENDECTOMY     ??? HX BREAST BIOPSY Left     Benign   ??? HX CESAREAN SECTION  1978, 1982   ??? HX GASTRIC BYPASS  2004   ??? HX GYN     ??? HX KNEE REPLACEMENT      bilateral   ??? HX LAP CHOLECYSTECTOMY  12/23/2016   ??? HX MOHS PROCEDURES      x 3 on right   ??? HX ORTHOPAEDIC      L4-L5 fusion   ??? HX OTHER SURGICAL      spinal cord stimulator   ??? HX PARTIAL HYSTERECTOMY  2000    supracervical   ??? HX TONSIL AND ADENOIDECTOMY      childhood     Current Outpatient Prescriptions   Medication Sig   ??? furosemide (LASIX) 40 mg tablet Take 1 Tab by mouth daily.   ??? pantoprazole (PROTONIX) 40 mg tablet Take 1 Tab by mouth daily.   ??? metoprolol succinate (TOPROL-XL) 100 mg tablet Take 1 Tab by mouth daily.   ??? oxyCODONE-acetaminophen (PERCOCET) 5-325 mg per tablet Take 1 Tab by  mouth every four (4) hours as needed for Pain. Max Daily Amount: 6 Tabs.   ??? loratadine (CLARITIN) 10 mg tablet TK 1 T PO QD   ??? buPROPion SR (WELLBUTRIN SR) 150 mg SR tablet bupropion HCl SR 150 mg tablet,12 hr sustained-release   ??? lurasidone (LATUDA) 20 mg tab tablet Latuda 20 mg tablet   ??? levorphanol (LEVO-DROMORAN) 2 mg tablet Take 1 tablet every 8 hours by oral route as directed for 30 days.   ??? cyclobenzaprine (FLEXERIL) 10 mg tablet TK 1 T PO Q 8 H PRF MSP   ??? levothyroxine (SYNTHROID) 25 mcg tablet Take 1 Tab by mouth Daily (before breakfast).     No current facility-administered medications for this visit.      Health Maintenance   Topic Date Due   ??? DTaP/Tdap/Td  series (1 - Tdap) 10/14/1977   ??? Shingrix Vaccine Age 60> (1 of 2) 10/15/2006   ??? Influenza Age 51 to Adult  11/26/2016   ??? BREAST CANCER SCRN MAMMOGRAM  06/04/2018   ??? COLONOSCOPY  07/14/2018   ??? Hepatitis C Screening  Completed     Immunization History   Administered Date(s) Administered   ??? Influenza Vaccine 02/17/2012, 02/10/2013, 01/17/2014   ??? Influenza Vaccine (Quad) PF 04/09/2016     No LMP recorded. Patient has had a hysterectomy.        Allergies and Intolerances:   Allergies   Allergen Reactions   ??? Adhesive Tape-Silicones Rash   ??? Opana Er [Oxymorphone] Other (comments)     Delirium.     Patient states she is not allergic.       Family History:   Family History   Problem Relation Age of Onset   ??? Alcohol abuse Mother    ??? Cancer Mother      breast   ??? Cancer Father      lung   ??? Diabetes Father    ??? Heart Disease Father    ??? Lung Disease Father    ??? Hypertension Father    ??? Cancer Brother      lung       Social History:   She  reports that she quit smoking about 6 years ago. Her smoking use included Cigarettes. She quit after 10.00 years of use. She has never used smokeless tobacco.  She  reports that she does not drink alcohol.            Review of Systems:   General: negative for - chills, fatigue, fever, pos for weight change   Psych: negative for - anxiety, depression, irritability or mood swings  ENT: negative for - headaches, hearing change, nasal congestion, oral lesions, sneezing or sore throat  Heme/ Lymph: negative for - bleeding problems, bruising, pallor or swollen lymph nodes  Endo: negative for - hot flashes, polydipsia/polyuria or temperature intolerance  Resp: negative for - cough, shortness of breath or wheezing  CV: negative for - chest pain, edema or palpitations  GI: negative for - abdominal pain, change in bowel habits, constipation, pos for diarrhea   GU: negative for - dysuria, hematuria, incontinence, pelvic pain or vulvar/vaginal symptoms  MSK: negative for - joint pain, joint swelling or muscle pain  Neuro: negative for - confusion, headaches, seizures or weakness  Derm: negative for - dry skin, hair changes, rash or skin lesion changes          Physical:   Vitals:   Vitals:    01/22/17 0953   BP: 145/72   Pulse: 68   Resp: 17   Temp: 97.3 ??F (36.3 ??C)   TempSrc: Oral   SpO2: 94%   Weight: 210 lb (95.3 kg)   Height: 5\' 1"  (1.549 m)           Exam:   HEENT- atraumatic,normocephalic, awake, oriented, well nourished  Neck - supple,no enlarged lymph nodes, no JVD, no thyromegaly  Chest- CTA, no rhonchi, no crackles  Heart- rrr, no murmurs / gallop/rub  Abdomen- soft,BS+,NT, no hepatosplenomegaly,mild abdominal tenderenss  Ext - no c/c/edema   Neuro- no focal deficits.Power 5/5 all extremities  Skin - warm,dry, no obvious rashes.          Review of Data:   LABS:   Lab Results   Component Value Date/Time    WBC 3.6 (L) 01/19/2017 05:15  AM    HGB 9.2 (L) 01/19/2017 05:15 AM    HCT 28.9 (L) 01/19/2017 05:15 AM    PLATELET 310 01/19/2017 05:15 AM     Lab Results   Component Value Date/Time    Sodium 144 01/19/2017 05:15 AM    Potassium 3.1 (L) 01/19/2017 05:15 AM    Chloride 107 01/19/2017 05:15 AM    CO2 28 01/19/2017 05:15 AM    Glucose 86 01/19/2017 05:15 AM    BUN 6 (L) 01/19/2017 05:15 AM     Creatinine 0.55 (L) 01/19/2017 05:15 AM     Lab Results   Component Value Date/Time    Cholesterol, total 179 05/20/2016 01:47 PM    HDL Cholesterol 76 (H) 05/20/2016 01:47 PM    LDL, calculated 83 05/20/2016 01:47 PM    Triglyceride 100 05/20/2016 01:47 PM     No results found for: GPT        Impression / Plan:        ICD-10-CM ICD-9-CM    1. Nausea R11.0 787.02 CBC WITH AUTOMATED DIFF      METABOLIC PANEL, COMPREHENSIVE      AMYLASE      LIPASE      REFERRAL TO GASTROENTEROLOGY   2. Weight loss R63.4 783.21 CBC WITH AUTOMATED DIFF      METABOLIC PANEL, COMPREHENSIVE      AMYLASE      LIPASE      REFERRAL TO GASTROENTEROLOGY   3. Essential hypertension I10 401.9    4. Diarrhea, unspecified type R19.7 787.91 CBC WITH AUTOMATED DIFF      METABOLIC PANEL, COMPREHENSIVE      AMYLASE      LIPASE      REFERRAL TO GASTROENTEROLOGY   5. Other osteoporosis, unspecified pathological fracture presence M81.8 733.09    6. Acute superficial gastritis without hemorrhage K29.00 535.40      Will get GI appt ASAP.      Stop celebrex and Fosomax.      Explained to patient risk benefits of the medications.Advised patient to stop meds if having any side effects.Pt verbalized understanding of the instructions.    I have discussed the diagnosis with the patient and the intended plan as seen in the above orders.  The patient has received an after-visit summary and questions were answered concerning future plans.  I have discussed medication side effects and warnings with the patient as well. I have reviewed the plan of care with the patient, accepted their input and they are in agreement with the treatment goals.     Reviewed plan of care. Patient has provided input and agrees with goals.    Follow-up Disposition: Not on File    Jenene Slicker, MD

## 2017-01-27 ENCOUNTER — Inpatient Hospital Stay: Payer: PRIVATE HEALTH INSURANCE

## 2017-01-27 MED ORDER — INSULIN LISPRO 100 UNIT/ML INJECTION
100 unit/mL | Freq: Once | SUBCUTANEOUS | Status: DC
Start: 2017-01-27 — End: 2017-01-27

## 2017-01-27 MED ORDER — PROPOFOL 10 MG/ML IV EMUL
10 mg/mL | INTRAVENOUS | Status: AC
Start: 2017-01-27 — End: ?

## 2017-01-27 MED ORDER — LACTATED RINGERS IV
INTRAVENOUS | Status: DC
Start: 2017-01-27 — End: 2017-01-27
  Administered 2017-01-27: 14:00:00 via INTRAVENOUS

## 2017-01-27 MED ORDER — FAMOTIDINE 20 MG TAB
20 mg | Freq: Once | ORAL | Status: DC
Start: 2017-01-27 — End: 2017-01-27

## 2017-01-27 MED ORDER — PROPOFOL 10 MG/ML IV EMUL
10 mg/mL | INTRAVENOUS | Status: DC | PRN
Start: 2017-01-27 — End: 2017-01-27
  Administered 2017-01-27 (×7): via INTRAVENOUS

## 2017-01-27 MED ORDER — HYDRALAZINE 20 MG/ML IJ SOLN
20 mg/mL | INTRAMUSCULAR | Status: AC
Start: 2017-01-27 — End: ?

## 2017-01-27 MED ORDER — LIDOCAINE (PF) 20 MG/ML (2 %) IJ SOLN
20 mg/mL (2 %) | INTRAMUSCULAR | Status: DC | PRN
Start: 2017-01-27 — End: 2017-01-27
  Administered 2017-01-27: 15:00:00 via INTRAVENOUS

## 2017-01-27 MED FILL — PROPOFOL 10 MG/ML IV EMUL: 10 mg/mL | INTRAVENOUS | Qty: 50

## 2017-01-27 MED FILL — LACTATED RINGERS IV: INTRAVENOUS | Qty: 1000

## 2017-01-27 MED FILL — HYDRALAZINE 20 MG/ML IJ SOLN: 20 mg/mL | INTRAMUSCULAR | Qty: 1

## 2017-01-27 NOTE — Other (Signed)
Husband Kenyetta Wimbish is designated point of contact for [employer].

## 2017-01-27 NOTE — Anesthesia Pre-Procedure Evaluation (Signed)
Anesthetic History               Review of Systems / Medical History      Pulmonary                   Neuro/Psych             Comments: Chronic pain Cardiovascular    Hypertension                   GI/Hepatic/Renal                Endo/Other        Morbid obesity and arthritis     Other Findings              Physical Exam    Airway  Mallampati: I  TM Distance: 4 - 6 cm  Neck ROM: normal range of motion   Mouth opening: Normal     Cardiovascular  Regular rate and rhythm,  S1 and S2 normal,  no murmur, click, rub, or gallop  Rhythm: regular  Rate: normal         Dental  No notable dental hx       Pulmonary  Breath sounds clear to auscultation               Abdominal  GI exam deferred       Other Findings            Anesthetic Plan    ASA: 3  Anesthesia type: MAC          Induction: Intravenous  Anesthetic plan and risks discussed with: Patient

## 2017-01-27 NOTE — Anesthesia Post-Procedure Evaluation (Signed)
Post-Anesthesia Evaluation and Assessment    Patient: Bridget Morton MRN: [id number]  SSN: [id number]    Date of Birth: Sep 24, 1955  Age: 61 y.o.  Sex: female      Data from PACU flowsheet    Cardiovascular Function/Vital Signs  Visit Vitals   ??? BP 117/56   ??? Pulse 78   ??? Temp 36.8 ??C (98.3 ??F)   ??? Resp 16   ??? Wt 98.9 kg (218 lb)   ??? SpO2 98%   ??? Breastfeeding No   ??? BMI 41.19 kg/m2       Patient is status post MAC anesthesia for Procedure(s):  ESOPHAGOGASTRODUODENOSCOPY (EGD).    Nausea/Vomiting: controlled    Postoperative hydration reviewed and adequate.    Pain:  Pain Scale 1: Numeric (0 - 10) (01/27/17 1119)  Pain Intensity 1: 0 (01/27/17 1119)   Managed      Mental Status and Level of Consciousness: Alert and oriented     Pulmonary Status:   O2 Device: Room air (01/27/17 1118)   Adequate oxygenation and airway patent    Complications related to anesthesia: None    Post-anesthesia assessment completed. No concerns    Signed By: Phylliss Blakes, MD     January 27, 2017

## 2017-01-27 NOTE — Other (Signed)
Phase 2 Recovery Summary  Patient arrived to Phase 2 at 1118  Report received from ENDO RN and CRNA  Vitals:    01/27/17 0919 01/27/17 1118 01/27/17 1119   BP: 111/80 117/56 117/56   Pulse: 66 78 78   Resp: 16     Temp: 98.3 ??F (36.8 ??C)     SpO2: 98% 98% 98%   Weight: 98.9 kg (218 lb)         oriented to time, place, person and situation    Lines and Drains  Peripheral Intravenous Line:      Wound  Wound Abdomen Anterior (Active)   Number of days:35       Wound Abdomen (Active)   Number of days:35          Dr. Janeann Forehand by bedside.    Patient discharged to home via [facility] as per protocol with husband who drove a Secretary/administrator L Pfeiffer

## 2017-01-27 NOTE — Procedures (Signed)
Endoscopy Procedure Note    Patient: Bridget Morton MRN: [id number]  SSN: [id number]    Date of Birth: [birth year]  Age: 61 y.o.  Sex: female      Date/Time:  01/27/2017 11:17 AM    Esophagogastrojejunoscopy (EGD) Procedure Note    Procedure: Esophagogastrojejunoscopy with biopsy    IMPRESSION:   1. Surgical changes of RYGB.  2. Gastritis of the gastric pouch. Biopsies taken to rule out H pylori infection.   3. Otherwise unremarkable exam.     RECOMMENDATIONS:  1. -Await pathology.      Indication: Intractable nausea  Operator:  Barkley Bruns, MD  Referring Provider:   Elnora Morrison, MD  History: The history and physical exam were reviewed and updated.   Endoscope: [id number]  Extent of Exam: proximal jejunum  ASA: Per Anesthesia  Anethesia/Sedation:  MAC anesthesia    Description of the procedure:   The procedure was discussed with the patient including risks, benefits, alternatives including risks of iv sedation, bleeding, perforation and aspiration.  A safety timeout was performed. The patient was placed in the left lateral decubitus position.  A bite block was placed.  The patient was given incremental doses of intravenous sedation until moderate sedation was achieved.  The patients vital signs were monitored at all times including heart rate/rhythm, blood pressure and oxygen saturation.  The endoscope was then passed under direct visualization to the proximal jejunum.  The endoscope was then slowly withdrawn while visualizing the mucosa. The patient was then transferred to recovery in stable condition.                Findings:    Esophagus:The esophageal mucosa without ulcers, erosions, inflammatory changes, changes of Barrett's esophagus, strictures, or mass lesions.   Stomach: Surgical changes of RYGB were seen. The gastric mucosa was without ulcers, erosions, vascular, or mass lesions. The gastric folds were normal. There was no retained food in the stomach pouch. Patchy  erythema was seen in the stomach pouch. Biopsies were taken to rule out H pylori infection.    Jejunum: The jejunal mucosa was without ulcers, erosions, inflammatory changes, vascular or mass lesions.     Therapies:  none    Specimens:   ID Type Source Tests Collected by Time Destination   1 : Bxs gastric pouch r/o h pylori Preservative Gastric  Barkley Bruns, MD 01/27/2017 1109 Pathology               Complications:   None; patient tolerated the procedure well.    SWN:[id number]    Discharge disposition:  Home in the company of driver when able to ambulate    Barkley Bruns, MD  January 27, 2017  11:17 AM

## 2017-01-27 NOTE — H&P (Signed)
History and Physical    Bridget SparrowJanet Mize Morton  1955-08-26  [id number]  [id number]    Pre-Procedure Diagnosis:  r11.0 nausea      Evaluation of past illnesses, surgeries, or injuries:   YES  Past Medical History:   Diagnosis Date   ??? Arthritis    ??? Chronic pain    ??? GERD (gastroesophageal reflux disease)    ??? H/O colonoscopy 2010    due 2020   ??? Hypertension    ??? Menopause      Past Surgical History:   Procedure Laterality Date   ??? HX APPENDECTOMY     ??? HX BREAST BIOPSY Left     Benign   ??? HX CESAREAN SECTION  1978, 1982   ??? HX GASTRIC BYPASS  2004   ??? HX GYN     ??? HX KNEE REPLACEMENT      bilateral   ??? HX LAP CHOLECYSTECTOMY  12/23/2016   ??? HX MOHS PROCEDURES      x 3 on right   ??? HX ORTHOPAEDIC      L4-L5 fusion   ??? HX OTHER SURGICAL      spinal cord stimulator   ??? HX PARTIAL HYSTERECTOMY  2000    supracervical   ??? HX TONSIL AND ADENOIDECTOMY      childhood   ??? NEUROLOGICAL PROCEDURE UNLISTED      spinal cord stimulator, lumbar fusions       Allergies:    Allergies   Allergen Reactions   ??? Adhesive Tape-Silicones Rash   ??? Opana Er [Oxymorphone] Other (comments)     Delirium.     Patient states she is not allergic.       Previous reactions to sedation/analgesia?  NO    Review of current medications, supplement, herbals and nutraceuticals complete:  YES  Current Facility-Administered Medications   Medication Dose Route Frequency Provider Last Rate Last Dose   ??? lactated Ringers infusion  50 mL/hr IntraVENous CONTINUOUS Lockie MolaAshley M Buchanan, CRNA 50 mL/hr at 01/27/17 0948 50 mL/hr at 01/27/17 0948   ??? famotidine (PEPCID) tablet 20 mg  20 mg Oral ONCE Lockie MolaAshley M Buchanan, CRNA       ??? insulin lispro (HUMALOG) injection   SubCUTAneous ONCE Lockie MolaAshley M Buchanan, CRNA              Pertinent labs reviewed?  YES    History of substance abuse?  NO  Family History   Problem Relation Age of Onset   ??? Alcohol abuse Mother    ??? Cancer Mother      breast   ??? Cancer Father      lung   ??? Diabetes Father    ??? Heart Disease Father     ??? Lung Disease Father    ??? Hypertension Father    ??? Cancer Brother      lung     Social History     Social History   ??? Marital status: MARRIED     Spouse name: N/A   ??? Number of children: N/A   ??? Years of education: N/A     Occupational History   ??? Not on file.     Social History Main Topics   ??? Smoking status: Former Smoker     Years: 10.00     Types: Cigarettes     Quit date: 12/16/2010   ??? Smokeless tobacco: Never Used   ??? Alcohol use No      Comment: seldom   ???  Drug use: No   ??? Sexual activity: Not Currently     Partners: Male     Birth control/ protection: None      Comment: Hysterectomy     Other Topics Concern   ??? Not on file     Social History Narrative       Cardiac Status:  WNL    Mental Status:  WNL     Pulmonary Status:  WNL    NPO:  5-8    Assessment/Impression: Nausea    Plan of treatment: EGD        Dellis Anes, MD  01/27/2017  11:01 AM

## 2017-01-27 NOTE — Procedures (Signed)
Procedures  by Barkley Bruns, MD at 01/27/17 1116                Author: Barkley Bruns, MD  Service: Gastroenterology  Author Type: Physician       Filed: 01/27/17 1121  Date of Service: 01/27/17 1116  Status: Signed          Editor: Barkley Bruns, MD (Physician)            Pre-procedure Diagnoses        1. Nausea [R11.0]        2. History of gastric bypass [Z98.84]                           Post-procedure Diagnoses        1. History of gastric bypass [Z98.84]        2. Gastritis determined by endoscopy [K29.70]                           Procedures        1. UPPER GI ENDOSCOPY,BIOPSY [[id number]]                                      Endoscopy Procedure Note          Patient: Bridget Morton  MRN: [id number]   SSN: [id number]          Date of Birth: 26-Dec-1955   Age: 61 y.o.   Sex: female         Date/Time:   01/27/2017 11:17 AM      Esophagogastrojejunoscopy (EGD) Procedure Note      Procedure: Esophagogastrojejunoscopy with biopsy      IMPRESSION:    1. Surgical changes of RYGB.   2. Gastritis of the gastric pouch. Biopsies taken to rule out H pylori infection.    3. Otherwise unremarkable exam.       RECOMMENDATIONS:   1. -Await pathology.         Indication: Intractable nausea   Operator:  Barkley Bruns, MD   Referring Provider:   Elnora Morrison, MD   History: The history and physical exam were reviewed and updated.    Endoscope: [id number]   Extent of Exam: proximal jejunum   ASA: Per Anesthesia   Anethesia/Sedation:  MAC anesthesia      Description of the procedure:    The procedure was discussed with the patient including risks, benefits, alternatives including risks of iv sedation, bleeding, perforation and aspiration.  A safety timeout was performed. The patient was placed in the left lateral decubitus position.   A bite block was placed.  The patient was given incremental doses of intravenous sedation until moderate sedation was achieved.  The patients vital signs were monitored at all  times including heart rate/rhythm, blood pressure and oxygen saturation.   The endoscope was then passed under direct visualization to the proximal jejunum.  The endoscope was then slowly withdrawn while visualizing the mucosa. The patient was then transferred to recovery in stable condition.                  Findings:     Esophagus:The esophageal mucosa without ulcers, erosions,  inflammatory changes, changes of Barrett's esophagus, strictures, or mass lesions.    Stomach: Surgical changes of RYGB were seen.  The gastric mucosa was without ulcers, erosions, vascular, or mass lesions. The gastric  folds were normal. There was no retained food in the stomach pouch. Patchy erythema was seen in the stomach pouch. Biopsies were taken to rule out H pylori infection.     Jejunum: The jejunal mucosa was without ulcers, erosions, inflammatory changes, vascular or mass lesions.       Therapies:  none      Specimens:            ID  Type  Source  Tests  Collected by  Time  Destination     1 : Bxs gastric pouch r/o h pylori  Preservative  Gastric    Barkley Bruns, MD  01/27/2017 1109  Pathology                   Complications:   None; patient tolerated the procedure well.      IRJ:[id number]      Discharge disposition:  Home in the company of driver when able to ambulate      Barkley Bruns, MD   January 27, 2017   11:17 AM

## 2017-01-28 NOTE — Progress Notes (Signed)
Chart review for upcoming new patient visit to discuss snoring; referred by Dr. Maryclare Labrador.

## 2017-01-29 ENCOUNTER — Encounter: Attending: Neurology | Primary: Internal Medicine

## 2017-01-29 MED FILL — LACTATED RINGERS IV: INTRAVENOUS | Qty: 300

## 2017-01-29 MED FILL — LIDOCAINE (PF) 20 MG/ML (2 %) IJ SOLN: 20 mg/mL (2 %) | INTRAMUSCULAR | Qty: 20

## 2017-01-29 MED FILL — PROPOFOL 10 MG/ML IV EMUL: 10 mg/mL | INTRAVENOUS | Qty: 170

## 2017-02-12 ENCOUNTER — Encounter: Attending: Internal Medicine | Primary: Internal Medicine

## 2017-03-09 ENCOUNTER — Encounter: Attending: Internal Medicine | Primary: Internal Medicine

## 2017-03-09 ENCOUNTER — Encounter

## 2017-03-10 ENCOUNTER — Encounter

## 2017-03-10 MED ORDER — CELECOXIB 200 MG CAP
200 mg | ORAL_CAPSULE | ORAL | 0 refills | Status: DC
Start: 2017-03-10 — End: 2017-04-04

## 2017-03-10 MED ORDER — METOPROLOL SUCCINATE SR 100 MG 24 HR TAB
100 mg | ORAL_TABLET | ORAL | 0 refills | Status: AC
Start: 2017-03-10 — End: ?

## 2017-03-10 MED ORDER — METOPROLOL SUCCINATE SR 100 MG 24 HR TAB
100 mg | ORAL_TABLET | ORAL | 0 refills | Status: DC
Start: 2017-03-10 — End: 2017-05-17

## 2017-03-11 ENCOUNTER — Encounter

## 2017-03-11 MED ORDER — FUROSEMIDE 40 MG TAB
40 mg | ORAL_TABLET | ORAL | 0 refills | Status: AC
Start: 2017-03-11 — End: ?

## 2017-03-17 DIAGNOSIS — J302 Other seasonal allergic rhinitis: Secondary | ICD-10-CM | POA: Insufficient documentation

## 2017-03-17 DIAGNOSIS — M159 Polyosteoarthritis, unspecified: Secondary | ICD-10-CM | POA: Insufficient documentation

## 2017-03-17 DIAGNOSIS — M47812 Spondylosis without myelopathy or radiculopathy, cervical region: Secondary | ICD-10-CM | POA: Insufficient documentation

## 2017-03-17 DIAGNOSIS — M47816 Spondylosis without myelopathy or radiculopathy, lumbar region: Secondary | ICD-10-CM

## 2017-04-02 ENCOUNTER — Ambulatory Visit: Payer: Managed Care, Other (non HMO) | Admitting: Student in an Organized Health Care Education/Training Program

## 2017-04-04 ENCOUNTER — Encounter

## 2017-04-05 MED ORDER — CELECOXIB 200 MG CAP
200 mg | ORAL_CAPSULE | ORAL | 0 refills | Status: DC
Start: 2017-04-05 — End: 2017-05-06

## 2017-04-06 ENCOUNTER — Encounter

## 2017-04-06 MED ORDER — LEVOTHYROXINE 25 MCG TAB
25 mcg | ORAL_TABLET | ORAL | 0 refills | Status: DC
Start: 2017-04-06 — End: 2017-05-06

## 2017-04-08 ENCOUNTER — Ambulatory Visit: Payer: Managed Care, Other (non HMO) | Admitting: Student in an Organized Health Care Education/Training Program

## 2017-04-09 ENCOUNTER — Encounter

## 2017-04-10 MED ORDER — FUROSEMIDE 40 MG TAB
40 mg | ORAL_TABLET | ORAL | 0 refills | Status: DC
Start: 2017-04-10 — End: 2017-05-09

## 2017-04-12 DIAGNOSIS — M12812 Other specific arthropathies, not elsewhere classified, left shoulder: Secondary | ICD-10-CM

## 2017-04-12 DIAGNOSIS — M12811 Other specific arthropathies, not elsewhere classified, right shoulder: Secondary | ICD-10-CM | POA: Insufficient documentation

## 2017-04-17 ENCOUNTER — Other Ambulatory Visit: Payer: Self-pay | Admitting: Surgery

## 2017-04-17 DIAGNOSIS — M7581 Other shoulder lesions, right shoulder: Secondary | ICD-10-CM | POA: Insufficient documentation

## 2017-04-17 DIAGNOSIS — M12812 Other specific arthropathies, not elsewhere classified, left shoulder: Principal | ICD-10-CM

## 2017-04-17 DIAGNOSIS — M75101 Unspecified rotator cuff tear or rupture of right shoulder, not specified as traumatic: Principal | ICD-10-CM

## 2017-04-17 DIAGNOSIS — M75102 Unspecified rotator cuff tear or rupture of left shoulder, not specified as traumatic: Principal | ICD-10-CM

## 2017-04-17 DIAGNOSIS — M75121 Complete rotator cuff tear or rupture of right shoulder, not specified as traumatic: Secondary | ICD-10-CM | POA: Insufficient documentation

## 2017-04-17 DIAGNOSIS — M12811 Other specific arthropathies, not elsewhere classified, right shoulder: Secondary | ICD-10-CM

## 2017-04-23 ENCOUNTER — Encounter: Payer: Self-pay | Admitting: Student in an Organized Health Care Education/Training Program

## 2017-04-23 ENCOUNTER — Ambulatory Visit
Payer: Managed Care, Other (non HMO) | Attending: Student in an Organized Health Care Education/Training Program | Admitting: Student in an Organized Health Care Education/Training Program

## 2017-04-23 ENCOUNTER — Other Ambulatory Visit: Payer: Self-pay

## 2017-04-23 VITALS — BP 123/66 | HR 54 | Temp 98.0°F | Resp 18 | Ht 61.0 in | Wt 202.0 lb

## 2017-04-23 DIAGNOSIS — G894 Chronic pain syndrome: Secondary | ICD-10-CM | POA: Diagnosis present

## 2017-04-23 DIAGNOSIS — I10 Essential (primary) hypertension: Secondary | ICD-10-CM | POA: Diagnosis not present

## 2017-04-23 DIAGNOSIS — Z87891 Personal history of nicotine dependence: Secondary | ICD-10-CM | POA: Diagnosis not present

## 2017-04-23 DIAGNOSIS — M5030 Other cervical disc degeneration, unspecified cervical region: Secondary | ICD-10-CM | POA: Insufficient documentation

## 2017-04-23 DIAGNOSIS — Z951 Presence of aortocoronary bypass graft: Secondary | ICD-10-CM | POA: Insufficient documentation

## 2017-04-23 DIAGNOSIS — M542 Cervicalgia: Secondary | ICD-10-CM | POA: Diagnosis not present

## 2017-04-23 DIAGNOSIS — Z8249 Family history of ischemic heart disease and other diseases of the circulatory system: Secondary | ICD-10-CM | POA: Insufficient documentation

## 2017-04-23 DIAGNOSIS — Z91048 Other nonmedicinal substance allergy status: Secondary | ICD-10-CM | POA: Diagnosis not present

## 2017-04-23 DIAGNOSIS — Z981 Arthrodesis status: Secondary | ICD-10-CM | POA: Diagnosis not present

## 2017-04-23 DIAGNOSIS — Z841 Family history of disorders of kidney and ureter: Secondary | ICD-10-CM | POA: Insufficient documentation

## 2017-04-23 DIAGNOSIS — M5136 Other intervertebral disc degeneration, lumbar region: Secondary | ICD-10-CM | POA: Insufficient documentation

## 2017-04-23 DIAGNOSIS — M47816 Spondylosis without myelopathy or radiculopathy, lumbar region: Secondary | ICD-10-CM | POA: Insufficient documentation

## 2017-04-23 DIAGNOSIS — M810 Age-related osteoporosis without current pathological fracture: Secondary | ICD-10-CM | POA: Insufficient documentation

## 2017-04-23 DIAGNOSIS — Z9049 Acquired absence of other specified parts of digestive tract: Secondary | ICD-10-CM | POA: Diagnosis not present

## 2017-04-23 DIAGNOSIS — Z9889 Other specified postprocedural states: Secondary | ICD-10-CM | POA: Insufficient documentation

## 2017-04-23 DIAGNOSIS — M51369 Other intervertebral disc degeneration, lumbar region without mention of lumbar back pain or lower extremity pain: Secondary | ICD-10-CM | POA: Insufficient documentation

## 2017-04-23 DIAGNOSIS — Z79899 Other long term (current) drug therapy: Secondary | ICD-10-CM | POA: Diagnosis not present

## 2017-04-23 DIAGNOSIS — F329 Major depressive disorder, single episode, unspecified: Secondary | ICD-10-CM | POA: Diagnosis not present

## 2017-04-23 DIAGNOSIS — Z809 Family history of malignant neoplasm, unspecified: Secondary | ICD-10-CM | POA: Insufficient documentation

## 2017-04-23 DIAGNOSIS — M797 Fibromyalgia: Secondary | ICD-10-CM | POA: Diagnosis not present

## 2017-04-23 DIAGNOSIS — Z9689 Presence of other specified functional implants: Secondary | ICD-10-CM | POA: Diagnosis not present

## 2017-04-23 MED ORDER — GABAPENTIN 300 MG PO CAPS: 300.0000 mg | ORAL_CAPSULE | Freq: Two times a day (BID) | ORAL | 2 refills | Status: DC

## 2017-04-23 NOTE — Progress Notes (Signed)
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  Procedures  . Compliance Drug Analysis, Ur  . Ambulatory referral to Psychiatry   Pharmacotherapy (current): Medications ordered:  Meds ordered this encounter  Medications  . gabapentin (NEURONTIN) 300 MG capsule    Sig: Take 1 capsule (300 mg total) by mouth 2 (two) times daily.    Dispense:  60 capsule    Refill:  2   Medications administered during this visit: Marissah Vandemark had no medications administered during this visit.   Pharmacological management options:  Opioid Analgesics: The patient was informed that there is no guarantee that she would be a candidate for opioid analgesics. The decision will be made following CDC guidelines. This decision will be based on the results of diagnostic studies, as well as Ms. Nason's risk profile.   Membrane stabilizer: Consider Lyrica, Cymbalta  if gabapentin not effective.  Muscle relaxant: Consider tizanidine, baclofen.  NSAID: Medically contraindicated status post gastric bypass surgery  Other analgesic(s): To be  determined at a later time   Interventional management options: Ms. Goecke was informed that there is no guarantee that she would be a candidate for interventional therapies. The decision will be based on the results of diagnostic studies, as well as Ms. Whitsell's risk profile.  Procedure(s) under consideration:  Pending CT myelogram, cervical facet blocks, cervical ESI.   Provider-requested follow-up: Return in about 3 weeks (around 05/14/2017).  Future Appointments  Date Time Provider [place]  05/11/2017 11:45 AM [name], MD [facility] None    Primary Care Physician: Tracie Harrier, MD Location: [facility] Note by: [name], M.D, Date: 04/23/2017; Time: 2:29 PM  Patient Instructions  1. UDS today 2. Increase gabapentin to 300 mg twice daily 3. Referral to Psychiatry for depression sx 4. Obtain CT myelogram  5. Follow up in 3 weeks

## 2017-04-23 NOTE — Patient Instructions (Addendum)
1. UDS today 2. Increase gabapentin to 300 mg twice daily 3. Referral to Psychiatry for depression sx 4. Obtain CT myelogram  5. Follow up in 3 weeks

## 2017-04-23 NOTE — Progress Notes (Signed)
Safety precautions to be maintained throughout the outpatient stay will include: orient to surroundings, keep bed in low position, maintain call bell within reach at all times, provide assistance with transfer out of bed and ambulation.  

## 2017-04-28 DIAGNOSIS — G8929 Other chronic pain: Secondary | ICD-10-CM

## 2017-04-28 DIAGNOSIS — Z79899 Other long term (current) drug therapy: Secondary | ICD-10-CM

## 2017-04-28 HISTORY — DX: Other long term (current) drug therapy: Z79.899

## 2017-04-28 HISTORY — DX: Other chronic pain: G89.29

## 2017-04-28 LAB — COMPLIANCE DRUG ANALYSIS, UR

## 2017-04-29 ENCOUNTER — Other Ambulatory Visit: Payer: Self-pay | Admitting: Student

## 2017-04-29 DIAGNOSIS — M545 Low back pain: Principal | ICD-10-CM

## 2017-04-29 DIAGNOSIS — G8929 Other chronic pain: Secondary | ICD-10-CM

## 2017-05-01 ENCOUNTER — Other Ambulatory Visit: Payer: Self-pay | Admitting: Student

## 2017-05-01 DIAGNOSIS — G959 Disease of spinal cord, unspecified: Secondary | ICD-10-CM

## 2017-05-01 DIAGNOSIS — G8929 Other chronic pain: Secondary | ICD-10-CM

## 2017-05-01 DIAGNOSIS — M542 Cervicalgia: Secondary | ICD-10-CM

## 2017-05-01 DIAGNOSIS — M545 Low back pain: Secondary | ICD-10-CM

## 2017-05-01 DIAGNOSIS — M79604 Pain in right leg: Secondary | ICD-10-CM

## 2017-05-06 ENCOUNTER — Encounter

## 2017-05-06 MED ORDER — CELECOXIB 200 MG CAP
200 mg | ORAL_CAPSULE | ORAL | 0 refills | Status: DC
Start: 2017-05-06 — End: 2017-06-04

## 2017-05-06 MED ORDER — LEVOTHYROXINE 25 MCG TAB
25 mcg | ORAL_TABLET | ORAL | 0 refills | Status: DC
Start: 2017-05-06 — End: 2017-05-31

## 2017-05-09 ENCOUNTER — Encounter

## 2017-05-10 MED ORDER — FUROSEMIDE 40 MG TAB
40 mg | ORAL_TABLET | ORAL | 0 refills | Status: AC
Start: 2017-05-10 — End: ?

## 2017-05-11 ENCOUNTER — Ambulatory Visit
Payer: Managed Care, Other (non HMO) | Attending: Student in an Organized Health Care Education/Training Program | Admitting: Student in an Organized Health Care Education/Training Program

## 2017-05-11 ENCOUNTER — Encounter: Payer: Self-pay | Admitting: Student in an Organized Health Care Education/Training Program

## 2017-05-11 ENCOUNTER — Other Ambulatory Visit: Payer: Self-pay

## 2017-05-11 VITALS — BP 116/62 | HR 65 | Temp 98.2°F | Resp 16 | Ht 61.0 in | Wt 202.0 lb

## 2017-05-11 DIAGNOSIS — M47816 Spondylosis without myelopathy or radiculopathy, lumbar region: Secondary | ICD-10-CM | POA: Diagnosis not present

## 2017-05-11 DIAGNOSIS — Z79890 Hormone replacement therapy (postmenopausal): Secondary | ICD-10-CM | POA: Diagnosis not present

## 2017-05-11 DIAGNOSIS — M542 Cervicalgia: Secondary | ICD-10-CM

## 2017-05-11 DIAGNOSIS — F329 Major depressive disorder, single episode, unspecified: Secondary | ICD-10-CM | POA: Diagnosis not present

## 2017-05-11 DIAGNOSIS — M47896 Other spondylosis, lumbar region: Secondary | ICD-10-CM | POA: Diagnosis not present

## 2017-05-11 DIAGNOSIS — Z87891 Personal history of nicotine dependence: Secondary | ICD-10-CM | POA: Insufficient documentation

## 2017-05-11 DIAGNOSIS — M5030 Other cervical disc degeneration, unspecified cervical region: Secondary | ICD-10-CM | POA: Diagnosis not present

## 2017-05-11 DIAGNOSIS — M797 Fibromyalgia: Secondary | ICD-10-CM | POA: Diagnosis not present

## 2017-05-11 DIAGNOSIS — M79606 Pain in leg, unspecified: Secondary | ICD-10-CM | POA: Diagnosis not present

## 2017-05-11 DIAGNOSIS — Z981 Arthrodesis status: Secondary | ICD-10-CM | POA: Diagnosis not present

## 2017-05-11 DIAGNOSIS — I10 Essential (primary) hypertension: Secondary | ICD-10-CM | POA: Diagnosis not present

## 2017-05-11 DIAGNOSIS — Z5181 Encounter for therapeutic drug level monitoring: Secondary | ICD-10-CM | POA: Insufficient documentation

## 2017-05-11 DIAGNOSIS — Z79899 Other long term (current) drug therapy: Secondary | ICD-10-CM | POA: Insufficient documentation

## 2017-05-11 DIAGNOSIS — M5136 Other intervertebral disc degeneration, lumbar region: Secondary | ICD-10-CM | POA: Diagnosis not present

## 2017-05-11 DIAGNOSIS — G894 Chronic pain syndrome: Secondary | ICD-10-CM | POA: Diagnosis not present

## 2017-05-11 DIAGNOSIS — M47892 Other spondylosis, cervical region: Secondary | ICD-10-CM | POA: Insufficient documentation

## 2017-05-11 DIAGNOSIS — Z79891 Long term (current) use of opiate analgesic: Secondary | ICD-10-CM | POA: Insufficient documentation

## 2017-05-11 DIAGNOSIS — M810 Age-related osteoporosis without current pathological fracture: Secondary | ICD-10-CM | POA: Diagnosis not present

## 2017-05-11 DIAGNOSIS — F32A Depression, unspecified: Secondary | ICD-10-CM

## 2017-05-11 MED ORDER — LEVORPHANOL TARTRATE 2 MG PO TABS: ORAL_TABLET | ORAL | 0 refills | Status: DC

## 2017-05-11 NOTE — Progress Notes (Signed)
Patient's Name: Connie [name]  MRN: [id number]  Referring Provider: Tracie Harrier, MD  DOB: 11-Apr-1956  PCP: Connie Harrier, MD  DOS: 05/11/2017  Note by: Connie Santa, MD  Service setting: Ambulatory outpatient  Specialty: Interventional Pain Management  Location: [facility] (AMB) Pain Management Facility    Patient type: Established   Primary Reason(s) for Visit: Encounter for prescription drug management. (Level of risk: moderate)  CC: Back Pain (lower, mid) and Leg Pain (cramping goes down outside of legs to feet including toes)  HPI  Connie [name] is a 62 y.o. year old, female patient, who comes today for a medication management evaluation. She has Degenerative joint disease of cervical and lumbar spine; Lumbar spondylosis; History of lumbar fusion; Lumbar degenerative disc disease; Fibromyalgia; Cervicalgia; DDD (degenerative disc disease), cervical; Chronic pain syndrome; Major depressive disorder; and Spinal cord stimulator status on their problem list. Her primarily concern today is the Back Pain (lower, mid) and Leg Pain (cramping goes down outside of legs to feet including toes)  Pain Assessment: Location: Right, Left, Mid Back Radiating: denies Onset: More than a month ago Duration: Chronic pain Quality: Constant, Aching, Stabbing, Cramping, Burning Severity: 2 /10 (self-reported pain score)  Note: Reported level is compatible with observation.                         When using our objective Pain Scale, levels between 6 and 10/10 are said to belong in an emergency room, as it progressively worsens from a 6/10, described as severely limiting, requiring emergency care not usually available at an outpatient pain management facility. At a 6/10 level, communication becomes difficult and requires great effort. Assistance to reach the emergency department may be required. Facial flushing and profuse sweating along with potentially dangerous increases in heart rate and blood pressure will be  evident. Effect on ADL: unable to walk independently, altered stability, limited ADLs Timing: Constant Modifying factors: medication, sitting  Connie [name] was last scheduled for an appointment on 04/23/2017 for medication management. During today's appointment we reviewed Connie [name]'s chronic pain status, as well as her outpatient medication regimen.  Patient follows up for medication management.  She was unable to get her CT myelogram completed secondary to insurance issues.  This is scheduled for early February.  Otherwise patient's gabapentin was increased to 300 mg twice daily patient notes improvement in her lower extremity burning and tingling.  Patient continues her levorphanol as previously prescribed, 1.5 tablets in the morning, 1 tablet in the afternoon, 1 tablet in the evening.  Patient's UDS was reviewed and was positive for THC.  I had a discussion with the patient about this.  She states that she utilizes CBD oil.  Patient states that it is not helpful and that she will not use it any longer.  I informed her of our clinic policy regarding illicit substances.  I told her that if she would like to continue the oil that she needs to bring it in for [place] to confirm.  Patient endorsed understanding.  The patient  reports that she does not use drugs. Her body mass index is 38.17 kg/m.  Further details on both, my assessment(s), as well as the proposed treatment plan, please see below.  Controlled Substance Pharmacotherapy Assessment REMS (Risk Evaluation and Mitigation Strategy)  PMP and historical list of controlled substances: Levorphanol 2 mg 3 times daily as needed, quantity 105, MME equals 77.Rise Patience  05/11/2017 12:12 PM  Signed Safety precautions to be  maintained throughout the outpatient stay will include: orient to surroundings, keep bed in low position, maintain call bell within reach at all times, provide assistance with transfer out of bed and ambulation.     Pharmacokinetics: Liberation and absorption (onset of action): WNL Distribution (time to peak effect): WNL Metabolism and excretion (duration of action): WNL         Pharmacodynamics: Desired effects: Analgesia: Connie [name] reports >50% benefit. Functional ability: Patient reports that medication allows her to accomplish basic ADLs Clinically meaningful improvement in function (CMIF): Sustained CMIF goals met Perceived effectiveness: Described as relatively effective, allowing for increase in activities of daily living (ADL) Undesirable effects: Side-effects or Adverse reactions: None reported Monitoring: [place] PMP: Online review of the past 2-monthperiod conducted. Compliant with practice rules and regulations Last UDS on record: Summary  Date Value Ref Range Status  04/23/2017 FINAL  Final    Comment:    ==================================================================== [facility] COMP DRUG ANALYSIS,UR ==================================================================== Test                             Result       Flag       Units Drug Present and Declared for Prescription Verification   Gabapentin                     PRESENT      EXPECTED   Dextrorphan/Levorphanol        PRESENT      EXPECTED    Levorphanol is a scheduled prescription medication.  Dextrorphan    is an expected metabolite of dextromethorphan, an    over-the-counter or prescription cough suppressant.  Levorphanol    cannot be distinguished from dextrorphan by the method used for    analysis.   Metoprolol                     PRESENT      EXPECTED Drug Present not Declared for Prescription Verification   Carboxy-THC                    42           UNEXPECTED ng/mg creat    Carboxy-THC is a metabolite of tetrahydrocannabinol  (THC).    [facility] most commonly illicit, but THC is also present    in a scheduled prescription  medication. ==================================================================== Test                      Result    Flag   Units      Ref Range   Creatinine              283              mg/dL      >=20 ==================================================================== Declared Medications:  The flagging and interpretation on this report are based on the  following declared medications.  Unexpected results may arise from  inaccuracies in the declared medications.  **Note: The testing scope of this panel includes these medications:  Gabapentin (Neurontin)  Levorphanol  Metoprolol  **Note: The testing scope of this panel does not include following  reported medications:  Celecoxib (Celebrex)  Furosemide (Lasix)  Glycopyrrolate (Robinul)  Levothyroxine  Loratadine  Ondansetron  Pantoprazole ==================================================================== For clinical consultation, please call ([phone] ====================================================================    UDS interpretation: Unexpected findings: Positive for THC which the patient states came from  using CBD oil.  States that she did not smoke or inhale marijuana. Medication Assessment Form: Reviewed. Patient indicates being compliant with therapy Treatment compliance: Compliant Risk Assessment Profile: Aberrant behavior: See prior evaluations. None observed or detected today Comorbid factors increasing risk of overdose: See prior notes. No additional risks detected today Risk of substance use disorder (SUD): Low Opioid Risk Tool - 04/23/17 1103      Family History of Substance Abuse   Alcohol  Negative    Illegal Drugs  Negative    Rx Drugs  Negative      Personal History of Substance Abuse   Alcohol  Negative    Illegal Drugs  Negative    Rx Drugs  Negative      Age   Age between 2-45 years   No      History of Preadolescent Sexual Abuse   History of Preadolescent Sexual Abuse  Negative  or Female      Psychological Disease   Psychological Disease  Negative    Depression  Positive      Total Score   Opioid Risk Tool Scoring  1    Opioid Risk Interpretation  Low Risk      ORT Scoring interpretation table:  Score <3 = Low Risk for SUD  Score between 4-7 = Moderate Risk for SUD  Score >8 = High Risk for Opioid Abuse   Risk Mitigation Strategies:  Patient Counseling: Covered Patient-Prescriber Agreement (PPA): Present and active  Notification to other healthcare providers: Done  Pharmacologic Plan: No change in therapy, at this time.             Laboratory Chemistry  Inflammation Markers (CRP: Acute Phase) (ESR: Chronic Phase) No results found for: CRP, ESRSEDRATE, LATICACIDVEN               Rheumatology Markers No results found for: RF, ANA, LABURIC, URICUR, LYMEIGGIGMAB, LYMEABIGMQN              Renal Function Markers No results found for: BUN, CREATININE, GFRAA, GFRNONAA               Hepatic Function Markers No results found for: AST, ALT, ALBUMIN, ALKPHOS, HCVAB, AMYLASE, LIPASE, AMMONIA               Electrolytes No results found for: NA, K, CL, CALCIUM, MG, PHOS               Neuropathy Markers No results found for: VITAMINB12, FOLATE, HGBA1C, HIV               Bone Pathology Markers No results found for: VD25OH, [id number], [id number], [id number], 25OHVITD1, 25OHVITD2, 25OHVITD3, TESTOFREE, TESTOSTERONE               Coagulation Parameters No results found for: INR, LABPROT, APTT, PLT, DDIMER               Cardiovascular Markers No results found for: BNP, CKTOTAL, CKMB, TROPONINI, HGB, HCT               CA Markers No results found for: CEA, CA125, LABCA2               Note: Lab results reviewed.  Recent Diagnostic Imaging Results  No image results found.  Complexity Note: Imaging results reviewed. Results shared with Ms. Livers, using Layman's terms.                         Meds  Current Outpatient Medications:  .  celecoxib  (CELEBREX) 200 MG capsule, TK 1 C PO D, Disp: , Rfl: 0 .  Cyanocobalamin (VITAMIN B-12 PO), Place 1 tablet under the tongue daily., Disp: , Rfl:  .  furosemide (LASIX) 40 MG tablet, TK 1 T PO D, Disp: , Rfl: 0 .  gabapentin (NEURONTIN) 300 MG capsule, Take 1 capsule (300 mg total) by mouth 2 (two) times daily., Disp: 60 capsule, Rfl: 2 .  glycopyrrolate (ROBINUL) 1 MG tablet, , Disp: , Rfl: 3 .  levorphanol (LEVODROMORAN) 2 MG tablet, 3 mg in am, 2 mg in afternoon and evening, Disp: 105 tablet, Rfl: 0 .  levothyroxine (SYNTHROID, LEVOTHROID) 25 MCG tablet, TK 1 T PO D B BRE, Disp: , Rfl: 0 .  loratadine (CLARITIN) 10 MG tablet, TK 1 T PO QD, Disp: , Rfl:  .  metoprolol succinate (TOPROL-XL) 100 MG 24 hr tablet, TK 1 T PO D, Disp: , Rfl: 0 .  ondansetron (ZOFRAN-ODT) 8 MG disintegrating tablet, DIS 1 T ON THE TONGUE TID PRF NAUSEA, Disp: , Rfl: 0 .  pantoprazole (PROTONIX) 40 MG tablet, pantoprazole 40 mg tablet,delayed release, Disp: , Rfl:   ROS  Constitutional: Denies any fever or chills Gastrointestinal: No reported hemesis, hematochezia, vomiting, or acute GI distress Musculoskeletal: Denies any acute onset joint swelling, redness, loss of ROM, or weakness Neurological: No reported episodes of acute onset apraxia, aphasia, dysarthria, agnosia, amnesia, paralysis, loss of coordination, or loss of consciousness  Allergies  Ms. Bunting is allergic to tape.  PFSH  Drug: Ms. Embree  reports that she does not use drugs. Alcohol:  reports that she does not drink alcohol. Tobacco:  reports that she has quit smoking. she has never used smokeless tobacco. Medical:  has a past medical history of Arthritis, Depression, Fibromyalgia, Hypertension, and Osteoporosis. Surgical: Ms. Graf  has a past surgical history that includes Cesarean section; Gastric bypass; Joint replacement (Bilateral); Lumbar fusion; Spinal cord stimulator implant; Cholecystectomy; Appendectomy; Tonsillectomy; and Shoulder  arthroscopy with rotator cuff repair (Right). Family: family history includes Cancer in her brother, father, and mother; Hypertension in her father; Kidney disease in her mother.  Constitutional Exam  General appearance: Well nourished, well developed, and well hydrated. In no apparent acute distress Vitals:   05/11/17 1145  BP: 116/62  Pulse: 65  Resp: 16  Temp: 98.2 F (36.8 C)  TempSrc: Oral  SpO2: 97%  Weight: 202 lb (91.6 kg)  Height: '5\' 1"'  (1.549 m)   BMI Assessment: Estimated body mass index is 38.17 kg/m as calculated from the following:   Height as of this encounter: '5\' 1"'  (1.549 m).   Weight as of this encounter: 202 lb (91.6 kg).  BMI interpretation table: BMI level Category Range association with higher incidence of chronic pain  <18 kg/m2 Underweight   18.5-24.9 kg/m2 Ideal body weight   25-29.9 kg/m2 Overweight Increased incidence by 20%  30-34.9 kg/m2 Obese (Class I) Increased incidence by 68%  35-39.9 kg/m2 Severe obesity (Class II) Increased incidence by 136%  >40 kg/m2 Extreme obesity (Class III) Increased incidence by 254%   BMI Readings from Last 4 Encounters:  05/11/17 38.17 kg/m  04/23/17 38.17 kg/m   Wt Readings from Last 4 Encounters:  05/11/17 202 lb (91.6 kg)  04/23/17 202 lb (91.6 kg)  Psych/Mental status: Alert, oriented x 3 (person, place, & time)       Eyes: PERLA Respiratory: No evidence of acute respiratory distress Cervical Spine Area Exam  Skin &  Axial Inspection: No masses, redness, edema, swelling, or associated skin lesions Alignment: Symmetrical Functional ROM: Decreased ROM, bilaterally Stability: No instability detected Muscle Tone/Strength: Functionally intact. No obvious neuro-muscular anomalies detected. Sensory (Neurological): Dermatomal pain pattern Palpation: Complains of area being tender to palpation overlying cervical facets                     Upper Extremity (UE) Exam    Side: Right upper extremity  Side:  Left upper extremity   Skin & Extremity Inspection: Skin color, temperature, and hair growth are WNL. No peripheral edema or cyanosis. No masses, redness, swelling, asymmetry, or associated skin lesions. No contractures.  Skin & Extremity Inspection: Skin color, temperature, and hair growth are WNL. No peripheral edema or cyanosis. No masses, redness, swelling, asymmetry, or associated skin lesions. No contractures.   Functional ROM: Unrestricted ROM          Functional ROM: Unrestricted ROM           Muscle Tone/Strength: Functionally intact. No obvious neuro-muscular anomalies detected.  Muscle Tone/Strength: Functionally intact. No obvious neuro-muscular anomalies detected.   Sensory (Neurological): Unimpaired          Sensory (Neurological): Unimpaired           Palpation: No palpable anomalies              Palpation: No palpable anomalies               Specialized Test(s): Deferred         Specialized Test(s): Deferred          Limited shoulder abduction bilaterally  Thoracic Spine Area Exam  Skin & Axial Inspection: No masses, redness, or swelling Alignment: Symmetrical Functional ROM: Unrestricted ROM Stability: No instability detected Muscle Tone/Strength: Functionally intact. No obvious neuro-muscular anomalies detected. Sensory (Neurological): Unimpaired Muscle strength & Tone: No palpable anomalies  Lumbar Spine Area Exam  Skin & Axial Inspection: Well healed scar from previous spine surgery detected Alignment: Symmetrical Functional ROM: Decreased ROM      Stability: No instability detected Muscle Tone/Strength: Functionally intact. No obvious neuro-muscular anomalies detected. Sensory (Neurological): Articular pain pattern Palpation: Complains of area being tender to palpation       Provocative Tests: Lumbar Hyperextension and rotation test: Positive bilaterally for facet joint pain. Lumbar Lateral bending test: Positive due to fusion restriction. Patrick's Maneuver:  evaluation deferred today                    Gait & Posture Assessment  Ambulation: Patient ambulates using a walker Gait: Limited. Using assistive device to ambulate Posture: Difficulty standing up straight, due to pain   Lower Extremity Exam    Side: Right lower extremity  Side: Left lower extremity  Skin & Extremity Inspection: Skin color, temperature, and hair growth are WNL. No peripheral edema or cyanosis. No masses, redness, swelling, asymmetry, or associated skin lesions. No contractures.  Skin & Extremity Inspection: Skin color, temperature, and hair growth are WNL. No peripheral edema or cyanosis. No masses, redness, swelling, asymmetry, or associated skin lesions. No contractures.  Functional ROM: Unrestricted ROM          Functional ROM: Unrestricted ROM          Muscle Tone/Strength: Functionally intact. No obvious neuro-muscular anomalies detected.  Muscle Tone/Strength: Functionally intact. No obvious neuro-muscular anomalies detected.  Sensory (Neurological): Unimpaired  Sensory (Neurological): Unimpaired  Palpation: No palpable anomalies  Palpation: No palpable anomalies  4 out  of 5 strength bilateral lower extremity: Plantar flexion, dorsiflexion, knee flexion, knee extension.  Assessment  Primary Diagnosis & Pertinent Problem List: The primary encounter diagnosis was History of lumbar fusion. Diagnoses of Lumbar degenerative disc disease, Lumbar spondylosis, Fibromyalgia, Cervicalgia, DDD (degenerative disc disease), cervical, Chronic pain syndrome, and Depression, unspecified depression type were also pertinent to this visit.  Status Diagnosis  Stable Stable Stable 1. History of lumbar fusion   2. Lumbar degenerative disc disease   3. Lumbar spondylosis   4. Fibromyalgia   5. Cervicalgia   6. DDD (degenerative disc disease), cervical   7. Chronic pain syndrome   8. Depression, unspecified depression type       62 year old female with chronic pain  syndrome secondary to lumbar degenerative disc disease, lumbar radiculopathy, lumbar spondylosis status post L3-L5 lumbar fusion who presents with worsening neck pain that radiates into bilateral shoulders and bilateral upper extremities consistent with myelopathy/radiculopathy.  Upon chart review from neurosurgery note, patient's MRI from 2016 showed mild compression at C5-C6 and stenosis at C6/7.  During her evaluation with neurosurgery, CT myelogram of her cervical spine was also recommended but the patient has not had this completed secondary to insurance issues.  This is tentatively scheduled for early February.  Treatment plan will be further discussed after results from CT myelogram of cervical spine but therapeutic considerations could include cervical epidural steroid injection, cervical facet medial branch nerve blocks, possible cervical radio frequency ablation.  In regards to medication patient's gabapentin was increased to 300 mg twice daily at the last visit.  She notes improvement in her lower extremity paresthesias without any significant side effects.Patient continues her levorphanol as previously prescribed, 1.5 tablets in the morning, 1 tablet in the afternoon, 1 tablet in the evening.  Patient's UDS was reviewed and was positive for THC.  I had a discussion with the patient about this.  She states that she utilizes CBD oil.  Patient states that it is not helpful and that she will not use it any longer.  I informed her of our clinic policy regarding illicit substances.  I told her that if she would like to continue the oil that she needs to bring it in for [place] to confirm.  Patient endorsed understanding.  Patient states that she was using CBD oil for sleep aid.  I recommended melatonin 6 mg nightly that she can obtain over-the-counter.  I will obtain a urine drug screen at the next visit and that should be negative for [facility] given that the patient will stop her CBD.  Patient is also scheduled for  shoulder surgery in the upcoming month.  I informed her that her surgery team should prescribe her any medications for her acute postoperative pain and that she should continue all the medications that I am prescribing her for her chronic pain.  Plan: -Continue with CT myelogram per neurosurgery -Refill of levorphanol as below.  -UDS at next visit. -Continue gabapentin 300 mg twice daily as previously prescribed.  -Referral to psychiatry regarding depression management -Follow-up in 1 month for medication management and discussion of treatment plan after completion of CT myelogram.  Interventional options could include cervical ESI, cervical facets, cervical radio frequency ablation    Plan of Care  Pharmacotherapy (Medications Ordered): Meds ordered this encounter  Medications  . levorphanol (LEVODROMORAN) 2 MG tablet    Sig: 3 mg in am, 2 mg in afternoon and evening    Dispense:  105 tablet    Refill:  0  Procedure(s) under consideration:  Pending CT myelogram, cervical facet blocks, cervical ESI, cervical radio frequency ablation  Time Note: Greater than 50% of the 25 minute(s) of face-to-face time spent with Ms. Wah, was spent in counseling/coordination of care regarding: opioid tolerance, the treatment plan, medication side effects, going over the informed consent, the opioid analgesic risks and possible complications and the appropriate use of her medications. Provider-requested follow-up: Return in about 4 weeks (around 06/08/2017) for Medication Management.  Future Appointments  Date Time Provider [place]  05/12/2017  8:00 AM [facility]  05/29/2017  8:00 AM [facility]  05/29/2017  8:30 AM [facility]  05/29/2017  9:00 AM [facility]  05/29/2017  9:30 AM [facility]  06/04/2017 11:30 AM Connie Santa, MD [facility] None    Primary Care Physician: Connie Harrier, MD Location: [facility] Note by: Connie [name], M.D Date: 05/11/2017; Time: 2:13 PM  There are no Patient Instructions on file for this visit.

## 2017-05-11 NOTE — Progress Notes (Signed)
Safety precautions to be maintained throughout the outpatient stay will include: orient to surroundings, keep bed in low position, maintain call bell within reach at all times, provide assistance with transfer out of bed and ambulation.  

## 2017-05-12 ENCOUNTER — Ambulatory Visit
Admission: RE | Admit: 2017-05-12 | Discharge: 2017-05-12 | Disposition: A | Discharge status: Home / Self Care | Payer: Managed Care, Other (non HMO) | Source: Ambulatory Visit | Attending: Student | Admitting: Student

## 2017-05-12 DIAGNOSIS — Z9889 Other specified postprocedural states: Secondary | ICD-10-CM | POA: Diagnosis not present

## 2017-05-12 DIAGNOSIS — M545 Low back pain: Secondary | ICD-10-CM

## 2017-05-12 DIAGNOSIS — M5136 Other intervertebral disc degeneration, lumbar region: Secondary | ICD-10-CM | POA: Insufficient documentation

## 2017-05-12 DIAGNOSIS — M4316 Spondylolisthesis, lumbar region: Secondary | ICD-10-CM | POA: Diagnosis not present

## 2017-05-12 DIAGNOSIS — M48061 Spinal stenosis, lumbar region without neurogenic claudication: Secondary | ICD-10-CM | POA: Diagnosis not present

## 2017-05-12 DIAGNOSIS — G8929 Other chronic pain: Secondary | ICD-10-CM | POA: Insufficient documentation

## 2017-05-17 ENCOUNTER — Encounter

## 2017-05-18 MED ORDER — METOPROLOL SUCCINATE SR 100 MG 24 HR TAB
100 mg | ORAL_TABLET | ORAL | 0 refills | Status: DC
Start: 2017-05-18 — End: 2018-06-06

## 2017-05-20 ENCOUNTER — Encounter: Payer: Self-pay | Admitting: Psychiatry

## 2017-05-20 ENCOUNTER — Ambulatory Visit (INDEPENDENT_AMBULATORY_CARE_PROVIDER_SITE_OTHER): Payer: 59 | Admitting: Psychiatry

## 2017-05-20 ENCOUNTER — Other Ambulatory Visit: Payer: Self-pay

## 2017-05-20 VITALS — BP 124/79 | HR 75 | Temp 98.5°F | Wt 206.4 lb

## 2017-05-20 DIAGNOSIS — M1990 Unspecified osteoarthritis, unspecified site: Secondary | ICD-10-CM | POA: Insufficient documentation

## 2017-05-20 DIAGNOSIS — M2550 Pain in unspecified joint: Secondary | ICD-10-CM | POA: Insufficient documentation

## 2017-05-20 DIAGNOSIS — F331 Major depressive disorder, recurrent, moderate: Secondary | ICD-10-CM

## 2017-05-20 MED ORDER — BUPROPION HCL ER (XL) 300 MG PO TB24: 300.0000 mg | ORAL_TABLET | Freq: Every day | ORAL | 0 refills | Status: DC
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
## 2017-05-20 NOTE — Patient Instructions (Addendum)
Bupropion tablets (Depression/Mood Disorders) What is this medicine? BUPROPION (byoo PROE pee on) is used to treat depression. This medicine may be used for other purposes; ask your health care provider or pharmacist if you have questions. COMMON BRAND NAME(S): Wellbutrin What should I tell my health care provider before I take this medicine? They need to know if you have any of these conditions: -an eating disorder, such as anorexia or bulimia -bipolar disorder or psychosis -diabetes or high blood sugar, treated with medication -glaucoma -heart disease, previous heart attack, or irregular heart beat -head injury or brain tumor -high blood pressure -kidney or liver disease -seizures -suicidal thoughts or a previous suicide attempt -Tourette's syndrome -weight loss -an unusual or allergic reaction to bupropion, other medicines, foods, dyes, or preservatives -breast-feeding -pregnant or trying to become pregnant How should I use this medicine? Take this medicine by mouth with a glass of water. Follow the directions on the prescription label. You can take it with or without food. If it upsets your stomach, take it with food. Take your medicine at regular intervals. Do not take your medicine more often than directed. Do not stop taking this medicine suddenly except upon the advice of your doctor. Stopping this medicine too quickly may cause serious side effects or your condition may worsen. A special MedGuide will be given to you by the pharmacist with each prescription and refill. Be sure to read this information carefully each time. Talk to your pediatrician regarding the use of this medicine in children. Special care may be needed. Overdosage: If you think you have taken too much of this medicine contact a poison control center or emergency room at once. NOTE: This medicine is only for you. Do not share this medicine with others. What if I miss a dose? If you miss a dose, take it as soon  as you can. If it is less than four hours to your next dose, take only that dose and skip the missed dose. Do not take double or extra doses. What may interact with this medicine? Do not take this medicine with any of the following medications: -linezolid -MAOIs like Azilect, Carbex, Eldepryl, Marplan, Nardil, and Parnate -methylene blue (injected into a vein) -other medicines that contain bupropion like Zyban This medicine may also interact with the following medications: -alcohol -certain medicines for anxiety or sleep -certain medicines for blood pressure like metoprolol, propranolol -certain medicines for depression or psychotic disturbances -certain medicines for HIV or AIDS like efavirenz, lopinavir, nelfinavir, ritonavir -certain medicines for irregular heart beat like propafenone, flecainide -certain medicines for Parkinson's disease like amantadine, levodopa -certain medicines for seizures like carbamazepine, phenytoin, phenobarbital -cimetidine -clopidogrel -cyclophosphamide -digoxin -furazolidone -isoniazid -nicotine -orphenadrine -procarbazine -steroid medicines like prednisone or cortisone -stimulant medicines for attention disorders, weight loss, or to stay awake -tamoxifen -theophylline -thiotepa -ticlopidine -tramadol -warfarin This list may not describe all possible interactions. Give your health care provider a list of all the medicines, herbs, non-prescription drugs, or dietary supplements you use. Also tell them if you smoke, drink alcohol, or use illegal drugs. Some items may interact with your medicine. What should I watch for while using this medicine? Tell your doctor if your symptoms do not get better or if they get worse. Visit your doctor or health care professional for regular checks on your progress. Because it may take several weeks to see the full effects of this medicine, it is important to continue your treatment as prescribed by your  doctor. Patients and their families  should watch out for new or worsening thoughts of suicide or depression. Also watch out for sudden changes in feelings such as feeling anxious, agitated, panicky, irritable, hostile, aggressive, impulsive, severely restless, overly excited and hyperactive, or not being able to sleep. If this happens, especially at the beginning of treatment or after a change in dose, call your health care professional. Avoid alcoholic drinks while taking this medicine. Drinking excessive alcoholic beverages, using sleeping or anxiety medicines, or quickly stopping the use of these agents while taking this medicine may increase your risk for a seizure. Do not drive or use heavy machinery until you know how this medicine affects you. This medicine can impair your ability to perform these tasks. Do not take this medicine close to bedtime. It may prevent you from sleeping. Your mouth may get dry. Chewing sugarless gum or sucking hard candy, and drinking plenty of water may help. Contact your doctor if the problem does not go away or is severe. What side effects may I notice from receiving this medicine? Side effects that you should report to your doctor or health care professional as soon as possible: -allergic reactions like skin rash, itching or hives, swelling of the face, lips, or tongue -breathing problems -changes in vision -confusion -elevated mood, decreased need for sleep, racing thoughts, impulsive behavior -fast or irregular heartbeat -hallucinations, loss of contact with reality -increased blood pressure -redness, blistering, peeling or loosening of the skin, including inside the mouth -seizures -suicidal thoughts or other mood changes -unusually weak or tired -vomiting Side effects that usually do not require medical attention (report to your doctor or health care professional if they continue or are bothersome): -constipation -headache -loss of  appetite -nausea -tremors -weight loss This list may not describe all possible side effects. Call your doctor for medical advice about side effects. You may report side effects to FDA at 1-800-FDA-1088. Where should I keep my medicine? Keep out of the reach of children. Store at room temperature between 20 and 25 degrees C (68 and 77 degrees F), away from direct sunlight and moisture. Keep tightly closed. Throw away any unused medicine after the expiration date. NOTE: This sheet is a summary. It may not cover all possible information. If you have questions about this medicine, talk to your doctor, pharmacist, or health care provider.  2018 Elsevier/Gold Standard (2015-10-05 13:44:21)   Serotonin Syndrome Serotonin is a brain chemical that regulates the nervous system, which includes the brain, spinal cord, and nerves. Serotonin appears to play a role in all types of behavior, including appetite, emotions, movement, thinking, and response to stress. Excessively high levels of serotonin in the body can cause serotonin syndrome, which is a very dangerous condition. What are the causes? This condition can be caused by taking medicines or drugs that increase the level of serotonin in your body. These include:  Antidepressant medicines.  Migraine medicines.  Certain pain medicines.  Certain recreational drugs, including ecstasy, LSD, cocaine, and amphetamines.  Over-the-counter cough or cold medicines that contain dextromethorphan.  Certain herbal supplements, including St. John's wort, ginseng, and nutmeg.  This condition usually occurs when you take these medicines or drugs in combination, but it can also happen with a high dose of a single medicine or drug. What increases the risk? This condition is more likely to develop in:  People who have recently increased the dosage of medicine that increases the serotonin level.  People who just started taking medicine that increases the  serotonin level.  What  are the signs or symptoms? Symptoms of this condition usually happens within several hours of a medicine change. Symptoms include:  Headache.  Muscle twitching or stiffness.  Diarrhea.  Confusion.  Restlessness or agitation.  Shivering or goose bumps.  Loss of muscle coordination.  Rapid heart rate.  Sweating.  Severe cases of serotonin syndromecan cause:  Irregular heartbeat.  Seizures.  Loss of consciousness.  High fever.  How is this diagnosed? This condition is diagnosed with a medical history and physical exam. You will be asked aboutyour symptoms and your use of medicines and recreational drugs. Your health care provider may also order lab work or additional tests to rule out other causes of your symptoms. How is this treated? The treatment for this condition depends on the severity of your symptoms. For mild cases, stopping the medicine that caused your condition is usually all that is needed. For moderate to severe cases, hospitalization is required to monitor you and to prevent further muscle damage. Follow these instructions at home:  Take over-the-counter and prescription medicines only as told by your health care provider. This is important.  Check with your health care provider before you start taking any new prescriptions, over-the-counter medicines, herbs, or supplements.  Avoid combining any medicines that can cause this condition to occur.  Keep all follow-up visits as told by your health care provider.This is important.  Maintain a healthy lifestyle. ? Eat healthy foods. ? Get plenty of sleep. ? Exercise regularly. ? Do not drink alcohol. ? Do not use recreational drugs. Contact a health care provider if:  Medicines do not seem to be helping.  Your symptoms do not improve or they get worse.  You have trouble taking care of yourself. Get help right away if:  You have worsening confusion, severe headache, chest  pain, high fever, seizures, or loss of consciousness.  You have serious thoughts about hurting yourself or others.  You experience serious side effects of medicine, such as swelling of your face, lips, tongue, or throat. This information is not intended to replace advice given to you by your health care provider. Make sure you discuss any questions you have with your health care provider. Document Released: 05/22/2004 Document Revised: 12/08/2015 Document Reviewed: 04/27/2014 Elsevier Interactive Patient Education  [employer].

## 2017-05-21 ENCOUNTER — Encounter: Payer: Self-pay | Admitting: Psychiatry

## 2017-05-28 ENCOUNTER — Inpatient Hospital Stay: Admission: RE | Admit: 2017-05-28 | Payer: Managed Care, Other (non HMO) | Source: Ambulatory Visit

## 2017-05-29 ENCOUNTER — Ambulatory Visit: Payer: Managed Care, Other (non HMO)

## 2017-05-29 ENCOUNTER — Ambulatory Visit: Admission: RE | Admit: 2017-05-29 | Payer: Managed Care, Other (non HMO) | Source: Ambulatory Visit

## 2017-05-31 ENCOUNTER — Encounter

## 2017-06-01 MED ORDER — LEVOTHYROXINE 25 MCG TAB
25 mcg | ORAL_TABLET | ORAL | 0 refills | Status: AC
Start: 2017-06-01 — End: ?

## 2017-06-02 ENCOUNTER — Other Ambulatory Visit: Payer: Self-pay

## 2017-06-02 ENCOUNTER — Encounter
Admission: RE | Admit: 2017-06-02 | Discharge: 2017-06-02 | Disposition: A | Discharge status: Home / Self Care | Payer: Managed Care, Other (non HMO) | Source: Ambulatory Visit | Attending: Surgery | Admitting: Surgery

## 2017-06-02 ENCOUNTER — Encounter: Payer: Self-pay | Admitting: *Deleted

## 2017-06-02 DIAGNOSIS — M5106 Intervertebral disc disorders with myelopathy, lumbar region: Secondary | ICD-10-CM | POA: Diagnosis not present

## 2017-06-02 DIAGNOSIS — K5909 Other constipation: Secondary | ICD-10-CM | POA: Insufficient documentation

## 2017-06-02 DIAGNOSIS — Z01812 Encounter for preprocedural laboratory examination: Secondary | ICD-10-CM | POA: Diagnosis present

## 2017-06-02 DIAGNOSIS — Z01810 Encounter for preprocedural cardiovascular examination: Secondary | ICD-10-CM | POA: Insufficient documentation

## 2017-06-02 DIAGNOSIS — Z87891 Personal history of nicotine dependence: Secondary | ICD-10-CM | POA: Insufficient documentation

## 2017-06-02 DIAGNOSIS — Z5181 Encounter for therapeutic drug level monitoring: Secondary | ICD-10-CM | POA: Insufficient documentation

## 2017-06-02 DIAGNOSIS — M797 Fibromyalgia: Secondary | ICD-10-CM | POA: Diagnosis not present

## 2017-06-02 DIAGNOSIS — M48061 Spinal stenosis, lumbar region without neurogenic claudication: Secondary | ICD-10-CM | POA: Insufficient documentation

## 2017-06-02 DIAGNOSIS — Z9049 Acquired absence of other specified parts of digestive tract: Secondary | ICD-10-CM | POA: Diagnosis not present

## 2017-06-02 DIAGNOSIS — R9431 Abnormal electrocardiogram [ECG] [EKG]: Secondary | ICD-10-CM | POA: Insufficient documentation

## 2017-06-02 DIAGNOSIS — M5030 Other cervical disc degeneration, unspecified cervical region: Secondary | ICD-10-CM | POA: Diagnosis not present

## 2017-06-02 DIAGNOSIS — M4716 Other spondylosis with myelopathy, lumbar region: Secondary | ICD-10-CM | POA: Diagnosis not present

## 2017-06-02 DIAGNOSIS — Z9889 Other specified postprocedural states: Secondary | ICD-10-CM | POA: Insufficient documentation

## 2017-06-02 DIAGNOSIS — Z79899 Other long term (current) drug therapy: Secondary | ICD-10-CM | POA: Diagnosis not present

## 2017-06-02 DIAGNOSIS — G894 Chronic pain syndrome: Secondary | ICD-10-CM | POA: Insufficient documentation

## 2017-06-02 DIAGNOSIS — Z90710 Acquired absence of both cervix and uterus: Secondary | ICD-10-CM | POA: Insufficient documentation

## 2017-06-02 DIAGNOSIS — K219 Gastro-esophageal reflux disease without esophagitis: Secondary | ICD-10-CM | POA: Diagnosis not present

## 2017-06-02 DIAGNOSIS — E669 Obesity, unspecified: Secondary | ICD-10-CM | POA: Insufficient documentation

## 2017-06-02 DIAGNOSIS — F329 Major depressive disorder, single episode, unspecified: Secondary | ICD-10-CM | POA: Diagnosis not present

## 2017-06-02 DIAGNOSIS — R1310 Dysphagia, unspecified: Secondary | ICD-10-CM | POA: Diagnosis not present

## 2017-06-02 HISTORY — DX: Cardiac murmur, unspecified: R01.1

## 2017-06-02 HISTORY — DX: Other chronic pain: G89.29

## 2017-06-02 HISTORY — DX: Postlaminectomy syndrome, not elsewhere classified: M96.1

## 2017-06-02 HISTORY — DX: Radiculopathy, lumbosacral region: M54.17

## 2017-06-02 HISTORY — DX: Anemia, unspecified: D64.9

## 2017-06-02 HISTORY — DX: Hypothyroidism, unspecified: E03.9

## 2017-06-02 HISTORY — DX: Spondylosis without myelopathy or radiculopathy, lumbosacral region: M47.817

## 2017-06-02 HISTORY — DX: Other spondylosis with myelopathy, cervical region: M47.12

## 2017-06-02 HISTORY — DX: Other intervertebral disc degeneration, lumbar region without mention of lumbar back pain or lower extremity pain: M51.369

## 2017-06-02 HISTORY — DX: Other long term (current) drug therapy: Z79.899

## 2017-06-02 HISTORY — DX: Other intervertebral disc degeneration, lumbar region: M51.36

## 2017-06-02 HISTORY — DX: Sacrococcygeal disorders, not elsewhere classified: M53.3

## 2017-06-02 HISTORY — DX: Other spondylosis with myelopathy, lumbar region: M47.16

## 2017-06-02 HISTORY — DX: Gastro-esophageal reflux disease without esophagitis: K21.9

## 2017-06-02 HISTORY — DX: Spondylolisthesis, site unspecified: M43.10

## 2017-06-02 HISTORY — DX: Other cervical disc displacement, unspecified cervical region: M50.20

## 2017-06-02 LAB — URINALYSIS, ROUTINE W REFLEX MICROSCOPIC
BACTERIA UA: NONE SEEN
GLUCOSE, UA: NEGATIVE mg/dL
HGB URINE DIPSTICK: NEGATIVE
KETONES UR: 5 mg/dL — AB
LEUKOCYTES UA: NEGATIVE
Nitrite: NEGATIVE
PROTEIN: 30 mg/dL — AB
RBC / HPF: NONE SEEN RBC/hpf (ref 0–5)
Specific Gravity, Urine: 1.031 — ABNORMAL HIGH (ref 1.005–1.030)
pH: 5 (ref 5.0–8.0)

## 2017-06-02 LAB — CBC WITH DIFFERENTIAL/PLATELET
BASOS ABS: 0.1 10*3/uL (ref 0–0.1)
Basophils Relative: 1 %
Eosinophils Absolute: 0 10*3/uL (ref 0–0.7)
Eosinophils Relative: 0 %
HEMATOCRIT: 33.5 % — AB (ref 35.0–47.0)
Hemoglobin: 10.9 g/dL — ABNORMAL LOW (ref 12.0–16.0)
LYMPHS PCT: 24 %
Lymphs Abs: 1.5 10*3/uL (ref 1.0–3.6)
MCH: 25.8 pg — ABNORMAL LOW (ref 26.0–34.0)
MCHC: 32.6 g/dL (ref 32.0–36.0)
MCV: 79.3 fL — AB (ref 80.0–100.0)
MONO ABS: 0.4 10*3/uL (ref 0.2–0.9)
MONOS PCT: 6 %
NEUTROS ABS: 4.1 10*3/uL (ref 1.4–6.5)
Neutrophils Relative %: 69 %
Platelets: 284 10*3/uL (ref 150–440)
RBC: 4.22 MIL/uL (ref 3.80–5.20)
RDW: 14.8 % — AB (ref 11.5–14.5)
WBC: 6.1 10*3/uL (ref 3.6–11.0)

## 2017-06-02 LAB — BASIC METABOLIC PANEL
ANION GAP: 10 (ref 5–15)
BUN: 9 mg/dL (ref 6–20)
CALCIUM: 8.1 mg/dL — AB (ref 8.9–10.3)
CHLORIDE: 99 mmol/L — AB (ref 101–111)
CO2: 33 mmol/L — AB (ref 22–32)
Creatinine, Ser: 0.71 mg/dL (ref 0.44–1.00)
GFR calc Af Amer: >60 mL/min (ref >60)
GFR calc non Af Amer: >60 mL/min (ref >60)
GLUCOSE: 87 mg/dL (ref 65–99)
Potassium: 2.8 mmol/L — ABNORMAL LOW (ref 3.5–5.1)
Sodium: 142 mmol/L (ref 135–145)

## 2017-06-02 LAB — SURGICAL PCR SCREEN
MRSA, PCR: NEGATIVE
Staphylococcus aureus: NEGATIVE

## 2017-06-02 LAB — TYPE AND SCREEN
ABO/RH(D): A POS
Antibody Screen: NEGATIVE

## 2017-06-02 NOTE — Patient Instructions (Addendum)
Your procedure is scheduled on: Tuesday, June 09, 2017  Report to [place]   To find out your arrival time please call [phone] between 1PM - 3PM on Monday, June 08, 2017   Remember: Instructions that are not followed completely may result in serious medical risk, up to and including death, or upon the discretion of your surgeon and anesthesiologist your surgery may need to be rescheduled.     _X__ 1. Do not eat food after midnight the night before your procedure.                 No gum chewing or hard candies.                  You may drink clear liquids up to 2 hours                 before you are scheduled to arrive for your surgery-                   Clear Liquids include:  water, apple juice without pulp, clear carbohydrate                 drink such as Clearfast of Gartorade, Black Coffee or Tea (Do not add                 anything to coffee or tea).  __X__2.  On the morning of surgery brush your teeth with toothpaste and water,                             you may rinse your mouth with mouthwash if you wish.                                    Do not swallow any toothpaste of mouthwash.     _X__ 3.  No Alcohol for 24 hours before or after surgery.   _X__ 4.  Do Not Smoke or use e-cigarettes For 24 Hours Prior to Your Surgery.                 Do not use any chewable tobacco products for at least 6 hours prior to                 surgery.  ____  5.  Bring all medications with you on the day of surgery if instructed.   _X___  6.  Notify your doctor if there is any change in your medical condition      (cold, fever, infections).     Do not wear jewelry, make-up, hairpins, clips or nail polish. Do not wear lotions, powders, or perfumes. You may wear deodorant. Do not shave 48 hours prior to surgery. Men may shave face and neck. Do not bring valuables to the hospital.    [facility] is not responsible for any belongings or  valuables.  Contacts, dentures or bridgework may not be worn into surgery. Leave your suitcase in the car. After surgery it may be brought to your room. For patients admitted to the hospital, discharge time is determined by your treatment team.   Patients discharged the day of surgery will not be allowed to drive home.   Please read over the following fact sheets that you were given:   PREPARING FOR SURGERY   ____ Take these  medicines the morning of surgery with A SIP OF WATER:    1.WELLBUTRIN  2. SYNTHROID  3. NEURONTIN  4. LEVORPHANOL  5. PROTONIX  (TAKE NIGHT BEFORE AND MORNING OF SURGERY)  6.  ____ Fleet Enema (as directed)   _X___ Use CHG Soap as directed  ____ Use inhalers on the day of surgery  __X__ Stop ALL ASPIRIN PRODUCTS NOW!!  _X___ Stop Anti-inflammatories NOW!!               THIS INCLUDES IBUPROFEN / MOTRIN / ADVIL / ALEVE / NAPROSYN / GOODYS POWDER /                            ALSO CELEBREX   _X___ Stop supplements until after surgery.  YOU MAY CONTINUE TO TAKE VITAMIN B12,                      JUST NOT DAY OF SURGERY.  ____ Bring C-Pap to the hospital.   CONTINUE TAKING LASIX AS NEEDED CONTINUE TAKING ROBINAL AS SCHEDULED            JUST DO NOT TAKE EITHER ON THE MORNING OF SURGERY  CONTINUE TO TAKE METOPROLOL AS USUAL, ESPECIALLY NIGHT BEFORE SURGERY  BRING STIMULATOR REMOTE

## 2017-06-03 LAB — URINE CULTURE

## 2017-06-04 ENCOUNTER — Encounter

## 2017-06-04 ENCOUNTER — Other Ambulatory Visit: Payer: Self-pay

## 2017-06-04 ENCOUNTER — Ambulatory Visit (HOSPITAL_BASED_OUTPATIENT_CLINIC_OR_DEPARTMENT_OTHER): Payer: Managed Care, Other (non HMO) | Admitting: Student in an Organized Health Care Education/Training Program

## 2017-06-04 ENCOUNTER — Encounter: Payer: Self-pay | Admitting: Student in an Organized Health Care Education/Training Program

## 2017-06-04 VITALS — BP 119/45 | HR 65 | Temp 98.4°F | Resp 16 | Ht 61.0 in | Wt 205.0 lb

## 2017-06-04 DIAGNOSIS — M5136 Other intervertebral disc degeneration, lumbar region: Secondary | ICD-10-CM

## 2017-06-04 DIAGNOSIS — M5030 Other cervical disc degeneration, unspecified cervical region: Secondary | ICD-10-CM | POA: Diagnosis not present

## 2017-06-04 DIAGNOSIS — F329 Major depressive disorder, single episode, unspecified: Secondary | ICD-10-CM | POA: Diagnosis not present

## 2017-06-04 DIAGNOSIS — M51369 Other intervertebral disc degeneration, lumbar region without mention of lumbar back pain or lower extremity pain: Secondary | ICD-10-CM

## 2017-06-04 DIAGNOSIS — M542 Cervicalgia: Secondary | ICD-10-CM

## 2017-06-04 DIAGNOSIS — Z981 Arthrodesis status: Secondary | ICD-10-CM | POA: Diagnosis not present

## 2017-06-04 DIAGNOSIS — Z9689 Presence of other specified functional implants: Secondary | ICD-10-CM | POA: Diagnosis not present

## 2017-06-04 DIAGNOSIS — G894 Chronic pain syndrome: Secondary | ICD-10-CM | POA: Diagnosis not present

## 2017-06-04 DIAGNOSIS — M797 Fibromyalgia: Secondary | ICD-10-CM | POA: Diagnosis not present

## 2017-06-04 DIAGNOSIS — Z01810 Encounter for preprocedural cardiovascular examination: Secondary | ICD-10-CM | POA: Diagnosis not present

## 2017-06-04 DIAGNOSIS — M47816 Spondylosis without myelopathy or radiculopathy, lumbar region: Secondary | ICD-10-CM

## 2017-06-04 MED ORDER — CELECOXIB 200 MG CAP
200 mg | ORAL_CAPSULE | ORAL | 0 refills | Status: AC
Start: 2017-06-04 — End: ?

## 2017-06-04 MED ORDER — LEVORPHANOL TARTRATE 2 MG PO TABS: ORAL_TABLET | ORAL | 0 refills | Status: DC

## 2017-06-04 NOTE — Patient Instructions (Signed)
1. Continue all medications for chronic pain 2. Surgery team to manage acute pain from shoulder surgery

## 2017-06-04 NOTE — Progress Notes (Signed)
Patient's Name: Connie [name]  MRN: [id number]  Referring Provider: Tracie Harrier, MD  DOB: [birth year]  PCP: Tracie Harrier, MD  DOS: 06/04/2017  Note by: [name], MD  Service setting: Ambulatory outpatient  Specialty: Interventional Pain Management  Location: [facility] (AMB) Pain Management Facility    Patient type: Established   Primary Reason(s) for Visit: Encounter for prescription drug management. (Level of risk: moderate)  CC: Back Pain (lower) and Neck Pain  HPI  Connie [name] is a 62 y.o. year old, female patient, who comes today for a medication management evaluation. She has Degenerative joint disease of cervical and lumbar spine; Lumbar spondylosis; History of lumbar fusion; Lumbar degenerative disc disease; Fibromyalgia; Cervicalgia; DDD (degenerative disc disease), cervical; Chronic pain syndrome; Major depressive disorder; Spinal cord stimulator status; Chiari malformation type I ([place]); Adiposis dolorosa; Acquired spondylolisthesis; Chronic constipation; Chronic midline low back pain; Chronic pain; Daytime somnolence; Delusions of parasitosis ([place]); Diastolic dysfunction; Dry eyes, bilateral; Dysphagia, oropharyngeal phase; Edema; GERD without esophagitis; Generalized osteoarthritis of multiple sites; Fluid collection at surgical site; Fatty liver; Fatigue; Essential hypertension; Internal hemorrhoid; Insomnia; Hypothyroidism; H/O gastric bypass; Seasonal allergies; Rotator cuff arthropathy of both shoulders; Osteoporosis; Obesity, morbid ([place]); Nonunion of fracture; Morgellons disease; Medication-induced delirium, acute, hyperactive ([place]); Lumbar pseudoarthrosis; Leg cramps; Iron deficiency; Joint pain; Spinal stenosis of lumbar region without neurogenic claudication; Arthritis; Complete tear of right rotator cuff; OA (osteoarthritis); Rotator cuff tendinitis, right; Lumbar post-laminectomy syndrome; Lumbar radiculopathy; and Cervical radiculopathy on their problem list. Her primarily  concern today is the Back Pain (lower) and Neck Pain  Pain Assessment: Location:   Back(neck) Radiating: numbness and tingling in hands and feet Onset: More than a month ago Duration: Chronic pain Quality: Throbbing, Aching Severity: 2 /10 (self-reported pain score)  Note: Reported level is compatible with observation.                         When using our objective Pain Scale, levels between 6 and 10/10 are said to belong in an emergency room, as it progressively worsens from a 6/10, described as severely limiting, requiring emergency care not usually available at an outpatient pain management facility. At a 6/10 level, communication becomes difficult and requires great effort. Assistance to reach the emergency department may be required. Facial flushing and profuse sweating along with potentially dangerous increases in heart rate and blood pressure will be evident. Effect on ADL:   Timing: Constant Modifying factors: sitting, medications  Connie [name] was last scheduled for an appointment on 05/11/2017 for medication management. During today's appointment we reviewed Connie [name]'s chronic pain status, as well as her outpatient medication regimen.  The patient  reports that she does not use drugs. Her body mass index is 38.73 kg/m.  Further details on both, my assessment(s), as well as the proposed treatment plan, please see below.  Controlled Substance Pharmacotherapy Assessment REMS (Risk Evaluation and Mitigation Strategy)  Analgesic:Levorphanol 3  Mg, '2mg'$   2 mg, daily, quantity 105, MME equals 77 MME/day: 77 mg/day.  Landis Martins, RN  06/04/2017 11:22 AM  Sign at close encounter Nursing Pain Medication Assessment:  Safety precautions to be maintained throughout the outpatient stay will include: orient to surroundings, keep bed in low position, maintain call bell within reach at all times, provide assistance with transfer out of bed and ambulation.  Medication Inspection Compliance:  Pill count conducted under aseptic conditions, in front of the patient. Neither the pills nor the bottle was removed  from the patient's sight at any time. Once count was completed pills were immediately returned to the patient in their original bottle.  Medication: Levorphanol Pill/Patch Count: 44 of 105 pills remain Pill/Patch Appearance: Markings consistent with prescribed medication Bottle Appearance: Standard pharmacy container. Clearly labeled. Filled Date: 01/16 / 2019 Last Medication intake:  Today   Pharmacokinetics: Liberation and absorption (onset of action): WNL Distribution (time to peak effect): WNL Metabolism and excretion (duration of action): WNL         Pharmacodynamics: Desired effects: Analgesia: Connie [name] reports >50% benefit. Functional ability: Patient reports that medication allows her to accomplish basic ADLs Clinically meaningful improvement in function (CMIF): Sustained CMIF goals met Perceived effectiveness: Described as relatively effective, allowing for increase in activities of daily living (ADL) Undesirable effects: Side-effects or Adverse reactions: None reported Monitoring: [place] PMP: Online review of the past 32-monthperiod conducted. Compliant with practice rules and regulations Last UDS on record: Summary  Date Value Ref Range Status  04/23/2017 FINAL  Final    Comment:    ==================================================================== TOXASSURE COMP DRUG ANALYSIS,UR ==================================================================== Test                             Result       Flag       Units Drug Present and Declared for Prescription Verification   Gabapentin                     PRESENT      EXPECTED   Dextrorphan/Levorphanol        PRESENT      EXPECTED    Levorphanol is a scheduled prescription medication.  Dextrorphan    is an expected metabolite of dextromethorphan, an    over-the-counter or prescription cough suppressant.   Levorphanol    cannot be distinguished from dextrorphan by the method used for    analysis.   Metoprolol                     PRESENT      EXPECTED Drug Present not Declared for Prescription Verification   Carboxy-THC                    42           UNEXPECTED ng/mg creat    Carboxy-THC is a metabolite of tetrahydrocannabinol  (THC).    Source of [facility] most commonly illicit, but THC is also present    in a scheduled prescription medication. ==================================================================== Test                      Result    Flag   Units      Ref Range   Creatinine              283              mg/dL      >=20 ==================================================================== Declared Medications:  The flagging and interpretation on this report are based on the  following declared medications.  Unexpected results may arise from  inaccuracies in the declared medications.  **Note: The testing scope of this panel includes these medications:  Gabapentin (Neurontin)  Levorphanol  Metoprolol  **Note: The testing scope of this panel does not include following  reported medications:  Celecoxib (Celebrex)  Furosemide (Lasix)  Glycopyrrolate (Robinul)  Levothyroxine  Loratadine  Ondansetron  Pantoprazole ==================================================================== For clinical  consultation, please call [phone]. ====================================================================    UDS interpretation: Compliant         Positive for THC.  Patient utilizing CBD oil which she brought in today.  Has not used it in the last month.  Will repeat UDS today.  Should be negative for THC. Medication Assessment Form: Reviewed. Patient indicates being compliant with therapy Treatment compliance: Compliant Risk Assessment Profile: Aberrant behavior: See prior evaluations. None observed or detected today Comorbid factors increasing risk of overdose: See prior notes.  No additional risks detected today Risk of substance use disorder (SUD): Low Opioid Risk Tool - 04/23/17 1103      Family History of Substance Abuse   Alcohol  Negative    Illegal Drugs  Negative    Rx Drugs  Negative      Personal History of Substance Abuse   Alcohol  Negative    Illegal Drugs  Negative    Rx Drugs  Negative      Age   Age between 13-45 years   No      History of Preadolescent Sexual Abuse   History of Preadolescent Sexual Abuse  Negative or Female      Psychological Disease   Psychological Disease  Negative    Depression  Positive      Total Score   Opioid Risk Tool Scoring  1    Opioid Risk Interpretation  Low Risk      ORT Scoring interpretation table:  Score <3 = Low Risk for SUD  Score between 4-7 = Moderate Risk for SUD  Score >8 = High Risk for Opioid Abuse   Risk Mitigation Strategies:  Patient Counseling: Covered Patient-Prescriber Agreement (PPA): Present and active  Notification to other healthcare providers: Done  Pharmacologic Plan: No change in therapy, at this time.             Laboratory Chemistry  Inflammation Markers (CRP: Acute Phase) (ESR: Chronic Phase) No results found for: CRP, ESRSEDRATE, LATICACIDVEN               Rheumatology Markers No results found for: RF, ANA, [facility], LYMEIGGIGMAB, [facility]              Renal Function Markers Lab Results  Component Value Date   BUN 9 06/02/2017   CREATININE 0.71 06/02/2017   GFRAA >60 06/02/2017   GFRNONAA >60 06/02/2017                 Hepatic Function Markers No results found for: AST, ALT, ALBUMIN, ALKPHOS, HCVAB, AMYLASE, LIPASE, AMMONIA               Electrolytes Lab Results  Component Value Date   NA 142 06/02/2017   K 2.8 (L) 06/02/2017   CL 99 (L) 06/02/2017   CALCIUM 8.1 (L) 06/02/2017                 Neuropathy Markers No results found for: VITAMINB12, FOLATE, HGBA1C, HIV               Bone Pathology Markers No results found for: VD25OH,  [id number], [id number], [id number], 25OHVITD1, 25OHVITD2, 25OHVITD3, TESTOFREE, TESTOSTERONE               Coagulation Parameters Lab Results  Component Value Date   PLT 284 06/02/2017                 Cardiovascular Markers Lab Results  Component Value Date   HGB 10.9 (L) 06/02/2017  HCT 33.5 (L) 06/02/2017                 CA Markers No results found for: CEA, CA125, LABCA2               Note: Lab results reviewed.  Recent Diagnostic Imaging Results  CT LUMBAR SPINE WO CONTRAST CLINICAL DATA:  62 year old female with chronic lumbar back pain and instability. Prior fusion and spinal cord stimulator placement.  EXAM: CT LUMBAR SPINE WITHOUT CONTRAST  TECHNIQUE: Multidetector CT imaging of the lumbar spine was performed without intravenous contrast administration. Multiplanar CT image reconstructions were also generated.  COMPARISON:  Report of [name] lumbar radiographs 06/18/2016 (no images available).  FINDINGS: Segmentation: Normal, with full size ribs at T12.  Alignment: Mildly to moderately exaggerated lumbar lordosis with superimposed grade 3 spondylolisthesis at L4-L5 (15-16 millimeters of anterolisthesis). Subtle grade 1 anterolisthesis of L3 on L4. Superimposed mild levoconvex lumbar spine curvature.  Vertebrae: Osteopenia. Postoperative changes in the lower lumbar spine are detailed below. No superimposed acute osseous abnormality identified. Intact visible sacrum and SI joints.  Paraspinal and other soft tissues: Partially visible right side posterior spinal stimulator device. Partially visible leads coursing toward the posterior thoracic spinal canal at the T9-T10 level.  Superimposed postoperative changes to the lower lumbar posterior paraspinal soft tissues from L2-L3 to L5-S1. Evidence of posterior paraspinal soft tissue granulation tissue, and possible small volume postoperative seromas (such as to the right of midline at L2-L3 on series  4, image 70 and along the residual right lamina of L3 on image 76).  Negative visible lung bases. Partially visible gastric bypass type postoperative changes in the left upper abdomen. Surgically absent gallbladder. Mild Calcified aortic atherosclerosis. Otherwise negative visible abdominal viscera.  Disc levels:  T9-T10: Right anterior eccentric disc osteophyte complex with mild vacuum phenomena. No stenosis.  T10-T11: Trace vacuum disc.  No stenosis.  T11-T12: Mild retrolisthesis. Mild vacuum disc. Anterior and lateral disc osteophyte complex. Mild if any T11 neural foraminal stenosis.  T12-L1:  Negative.  L1-L2: Mild retrolisthesis. Mild disc bulge and facet hypertrophy. Mild to moderate left L1 foraminal stenosis. Suspected mild left lateral recess stenosis (descending left L2 nerve level). No spinal or right foraminal stenosis.  L2-L3: Posterior disc space loss. Circumferential disc bulge and mostly far lateral endplate spurring. Moderate facet hypertrophy, greater on the right. Mild to moderate right L2 neural foraminal stenosis. No spinal or convincing lateral recess stenosis.  L3-L4: Sequelae of posterior fusion and partial decompression. Bilateral L3 pedicle screws with surrounding lucency indicating loosening (series 3, image 76 and sagittal images 26 (on the left) and 38 (on the right). Moderate to severe bilateral facet hypertrophy. Possible posterior element arthrodesis on the right (sagittal image 38). No convincing left posterior element or interbody arthrodesis. No stenosis.  L4-L5: Anterolisthesis measuring 15-16 millimeters. Severe disc space loss with vacuum disc. Bilateral pedicle screws and prior decompression. L4 pedicle screws appear intact without evidence of loosening. However, there is lucency along the course of both L5 pedicle screws best demonstrated on series 3, images 98 and 99. Possible bilateral posterior element arthrodesis. No  interbody arthrodesis. Probable mild residual spinal stenosis (sagittal image 34). No foraminal stenosis.  L5-S1: Severe disc space loss with prominent vacuum disc. Circumferential disc osteophyte complex. Moderate to severe facet hypertrophy with vacuum facet greater on the left. No definite spinal stenosis. Possible mild right lateral recess stenosis at the right S1 nerve level. Up to moderate left and severe right L5 neural  foraminal stenosis.  IMPRESSION: 1. Postoperative changes to the lower lumbar spine at L3-L4 and L4-L5. Superimposed grade 3 spondylolisthesis at L4-L5 (15-16 millimeters of anterolisthesis of L4 on L5). Evidence of bilateral L3 and L5 pedicle screw loosening, while L4 pedicle screws appear intact without loosening. Questionable right side posterior element arthrodesis at L3-L4, and questionable bilateral posterior element arthrodesis at L4-L5. Probable mild residual spinal stenosis at L4-L5. 2. Severe adjacent segment disease at L5-S1 including severe vacuum disc, severe facet arthropathy with vacuum facet. Moderate to severe bilateral L5 neural foraminal stenosis and possible mild right lateral recess stenosis at the descending right S1 nerve level. 3. Adjacent segment disease at L2-L3 with posterior disc space loss and moderate facet hypertrophy. Mild to moderate right L2 neural foraminal stenosis. 4. Comparatively mild lower thoracic and upper lumbar degeneration elsewhere. 5. Partially visible spinal thoracic spinal stimulator.  Electronically Signed   By: Genevie Ann M.D.   On: 05/12/2017 10:02  Complexity Note: Imaging results reviewed. Results shared with Ms. Amparan, using Layman's terms.                         Meds   Current Outpatient Medications:  .  buPROPion (WELLBUTRIN XL) 300 MG 24 hr tablet, Take 1 tablet (300 mg total) by mouth daily. (Patient taking differently: Take 300 mg by mouth daily. In the morning), Disp: 90 tablet, Rfl: 0 .   celecoxib (CELEBREX) 200 MG capsule, Take 200 mg by mouth once daily in the morning, Disp: , Rfl: 0 .  diphenhydrAMINE (BENADRYL) 25 MG tablet, Take 50 mg by mouth daily as needed for allergies., Disp: , Rfl:  .  fluticasone (FLONASE) 50 MCG/ACT nasal spray, Place 2 sprays into both nostrils daily as needed for allergies or rhinitis., Disp: , Rfl:  .  furosemide (LASIX) 40 MG tablet, Take 40 mg by mouth daily as needed for fluid., Disp: , Rfl:  .  gabapentin (NEURONTIN) 300 MG capsule, Take 1 capsule (300 mg total) by mouth 2 (two) times daily., Disp: 60 capsule, Rfl: 2 .  glycopyrrolate (ROBINUL) 1 MG tablet, Take 1 mg by mouth 2 (two) times daily. , Disp: , Rfl: 3 .  ibuprofen (ADVIL,MOTRIN) 200 MG tablet, Take 400 mg by mouth daily as needed for headache or moderate pain., Disp: , Rfl:  .  levorphanol (LEVODROMORAN) 2 MG tablet, 3 mg in am, 2 mg in afternoon, and 79m evening For chronic pain To last 30 days from fill date Fill on or after: 06/12/17, 07/12/17, Disp: 105 tablet, Rfl: 0 .  levothyroxine (SYNTHROID, LEVOTHROID) 25 MCG tablet, Take 25 mcg by mouth once daily before breakfast, Disp: , Rfl: 0 .  loratadine (CLARITIN) 10 MG tablet, Take 10 mgs by mouth once daily in the evening, Disp: , Rfl:  .  metoprolol succinate (TOPROL-XL) 100 MG 24 hr tablet, Take 100 mg by mouth once daily in the evening, Disp: , Rfl: 0 .  ondansetron (ZOFRAN-ODT) 8 MG disintegrating tablet, Take 8 mg by mouth daily as needed for nausea or vomiting., Disp: , Rfl:  .  pantoprazole (PROTONIX) 40 MG tablet, Take 40 mg by mouth once daily in the evening, Disp: , Rfl:  .  vitamin B-12 (CYANOCOBALAMIN) 1000 MCG tablet, Take 1,000 mcg by mouth daily., Disp: , Rfl:   ROS  Constitutional: Denies any fever or chills Gastrointestinal: No reported hemesis, hematochezia, vomiting, or acute GI distress Musculoskeletal: Denies any acute onset joint swelling, redness, loss of  ROM, or weakness Neurological: No reported episodes of  acute onset apraxia, aphasia, dysarthria, agnosia, amnesia, paralysis, loss of coordination, or loss of consciousness  Allergies  Ms. Stordahl is allergic to tape.  PFSH  Drug: Ms. Kimmey  reports that she does not use drugs. Alcohol:  reports that she does not drink alcohol. Tobacco:  reports that she quit smoking about 10 years ago. Her smoking use included cigarettes. She smoked 0.50 packs per day. she has never used smokeless tobacco. Medical:  has a past medical history of Anemia, Anxiety, Arthritis, Cervical spondylosis with myelopathy (2018), Chronic pain (2019), DDD (degenerative disc disease), lumbar, Depression, Displacement of cervical intervertebral disc (2018), Fibromyalgia, GERD (gastroesophageal reflux disease), Heart murmur, Hypertension, Hypothyroidism, Lumbar post-laminectomy syndrome (2018), Lumbosacral radiculitis (2018), Osteoporosis, Other long term (current) drug therapy (2019), Pain in the coccyx (2018), Spondylolisthesis (2018), Spondylosis of lumbosacral region without myelopathy or radiculopathy (2018), and Spondylosis with myelopathy, lumbar region (2018). Surgical: Ms. King  has a past surgical history that includes Cesarean section; Gastric bypass (2004); Lumbar fusion (2016); Spinal cord stimulator implant (2018); Cholecystectomy; Appendectomy; Tonsillectomy; Shoulder arthroscopy with rotator cuff repair (Right, 2008); Joint replacement (Bilateral, 2005); Abdominal hysterectomy (2000); epidural injection (2018); and arm surgery (2002). Family: family history includes Anxiety disorder in her mother; Cancer in her brother, father, and mother; Depression in her mother; Hypertension in her father; Kidney disease in her mother.  Constitutional Exam  General appearance: Well nourished, well developed, and well hydrated. In no apparent acute distress Vitals:   06/04/17 1112  BP: (!) 119/45  Pulse: 65  Resp: 16  Temp: 98.4 F (36.9 C)  TempSrc: Oral  SpO2: 97%  Weight:  205 lb (93 kg)  Height: _0  (1.549 m)   BMI Assessment: Estimated body mass index is 38.73 kg/m as calculated from the following:   Height as of this encounter: _1  (1.549 m).   Weight as of this encounter: 205 lb (93 kg).  BMI interpretation table: BMI level Category Range association with higher incidence of chronic pain  <18 kg/m2 Underweight   18.5-24.9 kg/m2 Ideal body weight   25-29.9 kg/m2 Overweight Increased incidence by 20%  30-34.9 kg/m2 Obese (Class I) Increased incidence by 68%  35-39.9 kg/m2 Severe obesity (Class II) Increased incidence by 136%  >40 kg/m2 Extreme obesity (Class III) Increased incidence by 254%   BMI Readings from Last 4 Encounters:  06/04/17 38.73 kg/m  06/02/17 38.73 kg/m  05/11/17 38.17 kg/m  04/23/17 38.17 kg/m   Wt Readings from Last 4 Encounters:  06/04/17 205 lb (93 kg)  06/02/17 205 lb (93 kg)  05/11/17 202 lb (91.6 kg)  04/23/17 202 lb (91.6 kg)  Psych/Mental status: Alert, oriented x 3 (person, place, & time)       Eyes: PERLA Respiratory: No evidence of acute respiratory distress  Cervical Spine Area Exam  Skin & Axial Inspection: No masses, redness, edema, swelling, or associated skin lesions Alignment: Symmetrical Functional ROM: Unrestricted ROM      Stability: No instability detected Muscle Tone/Strength: Functionally intact. No obvious neuro-muscular anomalies detected. Sensory (Neurological): Unimpaired Palpation: No palpable anomalies              Upper Extremity (UE) Exam    Side: Right upper extremity  Side: Left upper extremity  Skin & Extremity Inspection: Skin color, temperature, and hair growth are WNL. No peripheral edema or cyanosis. No masses, redness, swelling, asymmetry, or associated skin lesions. No contractures.  Skin & Extremity Inspection: Skin color,  temperature, and hair growth are WNL. No peripheral edema or cyanosis. No masses, redness, swelling, asymmetry, or associated skin lesions. No  contractures.  Functional ROM: Unrestricted ROM          Functional ROM: Unrestricted ROM          Muscle Tone/Strength: Functionally intact. No obvious neuro-muscular anomalies detected.  Muscle Tone/Strength: Functionally intact. No obvious neuro-muscular anomalies detected.  Sensory (Neurological): Unimpaired          Sensory (Neurological): Unimpaired          Palpation: No palpable anomalies              Palpation: No palpable anomalies              Specialized Test(s): Deferred         Specialized Test(s): Deferred          Thoracic Spine Area Exam  Skin & Axial Inspection: No masses, redness, or swelling Alignment: Symmetrical Functional ROM: Unrestricted ROM Stability: No instability detected Muscle Tone/Strength: Functionally intact. No obvious neuro-muscular anomalies detected. Sensory (Neurological): Unimpaired Muscle strength & Tone: No palpable anomalies   Lumbar Spine Area Exam  Skin & Axial Inspection:Well healed scar from previous spine surgery detected Alignment:Symmetrical Functional PNT:[id number] ROM Stability:No instability detected Muscle Tone/Strength:Functionally intact. No obvious neuro-muscular anomalies detected. Sensory (Neurological):Articular pain pattern Palpation:Complains of area being tender to palpation Provocative Tests: Lumbar Hyperextension and rotation test:Positivebilaterally for facet joint pain. Lumbar Lateral bending test:Positivedue to fusion restriction. Patrick's Maneuver:evaluation deferred today  Gait & Posture Assessment  Ambulation:Patient ambulates using a walker Gait:Limited. Using assistive device to ambulate Posture:Difficulty standing up straight, due to pain  Lower Extremity Exam    Side:Right lower extremity  Side:Left lower extremity  Skin & Extremity Inspection:Skin color, temperature, and hair growth are WNL. No peripheral edema or cyanosis. No masses, redness, swelling,  asymmetry, or associated skin lesions. No contractures.  Skin & Extremity Inspection:Skin color, temperature, and hair growth are WNL. No peripheral edema or cyanosis. No masses, redness, swelling, asymmetry, or associated skin lesions. No contractures.  Functional GQQ:[id number] ROM  Functional IWP:[id number] ROM  Muscle Tone/Strength:Functionally intact. No obvious neuro-muscular anomalies detected.  Muscle Tone/Strength:Functionally intact. No obvious neuro-muscular anomalies detected.  Sensory (Neurological):Unimpaired  Sensory (Neurological):Unimpaired  Palpation:No palpable anomalies  Palpation:No palpable anomalies  4out of 5 strength bilateral lower extremity: Plantar flexion, dorsiflexion, knee flexion, knee extension.  Assessment  Primary Diagnosis & Pertinent Problem List: The primary encounter diagnosis was Chronic pain syndrome. Diagnoses of History of lumbar fusion, Lumbar degenerative disc disease, Lumbar spondylosis, Fibromyalgia, Cervicalgia, DDD (degenerative disc disease), cervical, Major depressive disorder, remission status unspecified, unspecified whether recurrent, and Spinal cord stimulator status were also pertinent to this visit.  Status Diagnosis  Persistent Persistent Persistent 1. Chronic pain syndrome   2. History of lumbar fusion   3. Lumbar degenerative disc disease   4. Lumbar spondylosis   5. Fibromyalgia   6. Cervicalgia   7. DDD (degenerative disc disease), cervical   8. Major depressive disorder, remission status unspecified, unspecified whether recurrent   9. Spinal cord stimulator status     General Recommendations: The pain condition that the patient suffers from is best treated with a multidisciplinary approach that involves an increase in physical activity to prevent de-conditioning and worsening of the pain cycle, as well as psychological counseling (formal and/or informal) to address the co-morbid psychological  affects of pain. Treatment will often involve judicious use of pain medications and interventional procedures to decrease the  pain, allowing the patient to participate in the physical activity that will ultimately produce long-lasting pain reductions. The goal of the multidisciplinary approach is to return the patient to a higher level of overall function and to restore their ability to perform activities of daily living.   62 year old female with chronic pain syndrome secondary to lumbar degenerative disc disease, lumbar radiculopathy, lumbar spondylosis status post L3-L5 lumbar fusion who presents with worsening neck pain that radiates into bilateral shoulders and bilateral upper extremities consistent with myelopathy/radiculopathy. Upon chart review from neurosurgery note, patient's MRI from 2016 showed mild compression at C5-C6 and stenosis at C6/7. During her evaluation with neurosurgery, CT myelogram of her cervical spine was also recommended but the patient has not had this completed as she was ill and had to cancel the scheduled procedure.  Patient has upcoming right shoulder surgery and will not be able to complete her CT myelogram until after then.Treatment plan will be further discussed after results from CT myelogram of cervical spine but therapeutic considerations could include cervical epidural steroid injection, cervical facet medial branch nerve blocks, possible cervical radio frequency ablation.  Today I will refill the patient's levorphanol at its current dose.  I will also obtain a repeat urine drug screen.  Patient's last UDS was positive for [facility] however the patient has been utilizing CBD oil as a sleep aid which the patient brought in today.  She has not used CBD oil in the last month.  I expect her UDS to be negative for [facility] today.  In regards to her right shoulder surgery, I told the patient to continue all of her chronic pain medications as prescribed and that her surgical team  will manage her acute postoperative pain which will most likely include opioid therapy.  She is instructed to take these medications on top of her chronic pain medications which includes levorphanol 3 mg every morning, 2 mg in the afternoon, 2 mg nightly.  Plan: -UDS today.  Expect this to be negative for [facility] (patient utilizing CBD oil for sleep which she brought in today) -Refill levorphanol as below. -Follow-up in 2 months or earlier.  After CT myelogram, will discuss further therapeutic plan. -Continue depression management with psychiatry.  Plan of Care  Pharmacotherapy (Medications Ordered): Meds ordered this encounter  Medications  . DISCONTD: levorphanol (LEVODROMORAN) 2 MG tablet    Sig: 3 mg in am, 2 mg in afternoon, and 61m evening For chronic pain To last 30 days from fill date Fill on or after: 06/12/17, 07/12/17    Dispense:  105 tablet    Refill:  0  . levorphanol (LEVODROMORAN) 2 MG tablet    Sig: 3 mg in am, 2 mg in afternoon, and 262mevening For chronic pain To last 30 days from fill date Fill on or after: 06/12/17, 07/12/17    Dispense:  105 tablet    Refill:  0   Lab-work, procedure(s), and/or referral(s): Orders Placed This Encounter  Procedures  . ToxASSURE Select 13 (MW), Urine    Provider-requested follow-up: Return in about 8 weeks (around 07/30/2017) for Medication Management. Time Note: Greater than 50% of the 25 minute(s) of face-to-face time spent with Ms. Philyaw, was spent in counseling/coordination of care regarding: the treatment plan, treatment alternatives, medication side effects, the opioid analgesic risks and possible complications, realistic expectations and the medication agreement. Future Appointments  Date Time Provider [place]2/26/2019  2:00 PM [facility] [place] None  06/23/2017  3:00 PM Eappen,  Ria Clock, MD [facility] None  08/04/2017 12:15 PM [name], MD [facility] None    Primary Care Physician: Tracie Harrier, MD Location: [facility] Management Facility Note by: [name], M.D Date: 06/04/2017; Time: 1:42 PM  Patient Instructions  1. Continue all medications for chronic pain 2. Surgery team to manage acute pain from shoulder surgery

## 2017-06-04 NOTE — Progress Notes (Signed)
Nursing Pain Medication Assessment:  Safety precautions to be maintained throughout the outpatient stay will include: orient to surroundings, keep bed in low position, maintain call bell within reach at all times, provide assistance with transfer out of bed and ambulation.  Medication Inspection Compliance: Pill count conducted under aseptic conditions, in front of the patient. Neither the pills nor the bottle was removed from the patient's sight at any time. Once count was completed pills were immediately returned to the patient in their original bottle.  Medication: Levorphanol Pill/Patch Count: 44 of 105 pills remain Pill/Patch Appearance: Markings consistent with prescribed medication Bottle Appearance: Standard pharmacy container. Clearly labeled. Filled Date: 01/16 / 2019 Last Medication intake:  Today

## 2017-06-05 ENCOUNTER — Ambulatory Visit: Payer: Managed Care, Other (non HMO)

## 2017-06-05 NOTE — Progress Notes (Signed)
EKG of 06/02/2017 reviewed by Dr. Kayleen Memos (anesthesia). Recommends patient get cleared by PCP or cardiologist to address new T wave inversion. Clearance form faxed to Dr. Roetta Sessions (patient's PCP on file).

## 2017-06-09 DIAGNOSIS — R0602 Shortness of breath: Secondary | ICD-10-CM | POA: Insufficient documentation

## 2017-06-10 LAB — TOXASSURE SELECT 13 (MW), URINE

## 2017-06-23 ENCOUNTER — Emergency Department
Admission: EM | Admit: 2017-06-23 | Discharge: 2017-06-23 | Disposition: A | Discharge status: Home / Self Care | Payer: Managed Care, Other (non HMO) | Attending: Emergency Medicine | Admitting: Emergency Medicine

## 2017-06-23 ENCOUNTER — Ambulatory Visit: Payer: 59 | Admitting: Licensed Clinical Social Worker

## 2017-06-23 ENCOUNTER — Encounter: Payer: Self-pay | Admitting: Emergency Medicine

## 2017-06-23 ENCOUNTER — Ambulatory Visit: Payer: 59 | Admitting: Psychiatry

## 2017-06-23 DIAGNOSIS — M25521 Pain in right elbow: Secondary | ICD-10-CM | POA: Diagnosis present

## 2017-06-23 DIAGNOSIS — Z9884 Bariatric surgery status: Secondary | ICD-10-CM | POA: Insufficient documentation

## 2017-06-23 DIAGNOSIS — F419 Anxiety disorder, unspecified: Secondary | ICD-10-CM | POA: Diagnosis not present

## 2017-06-23 DIAGNOSIS — Z79899 Other long term (current) drug therapy: Secondary | ICD-10-CM | POA: Insufficient documentation

## 2017-06-23 DIAGNOSIS — Z96653 Presence of artificial knee joint, bilateral: Secondary | ICD-10-CM | POA: Diagnosis not present

## 2017-06-23 DIAGNOSIS — M25631 Stiffness of right wrist, not elsewhere classified: Secondary | ICD-10-CM

## 2017-06-23 DIAGNOSIS — I10 Essential (primary) hypertension: Secondary | ICD-10-CM | POA: Insufficient documentation

## 2017-06-23 DIAGNOSIS — M25522 Pain in left elbow: Secondary | ICD-10-CM | POA: Diagnosis not present

## 2017-06-23 DIAGNOSIS — Z9049 Acquired absence of other specified parts of digestive tract: Secondary | ICD-10-CM | POA: Diagnosis not present

## 2017-06-23 DIAGNOSIS — M25632 Stiffness of left wrist, not elsewhere classified: Secondary | ICD-10-CM | POA: Insufficient documentation

## 2017-06-23 DIAGNOSIS — Z87891 Personal history of nicotine dependence: Secondary | ICD-10-CM | POA: Diagnosis not present

## 2017-06-23 DIAGNOSIS — F329 Major depressive disorder, single episode, unspecified: Secondary | ICD-10-CM | POA: Diagnosis not present

## 2017-06-23 DIAGNOSIS — E039 Hypothyroidism, unspecified: Secondary | ICD-10-CM | POA: Insufficient documentation

## 2017-06-23 LAB — COMPREHENSIVE METABOLIC PANEL
ALT: 9 U/L — AB (ref 14–54)
ANION GAP: 9 (ref 5–15)
AST: 20 U/L (ref 15–41)
Albumin: 3.6 g/dL (ref 3.5–5.0)
Alkaline Phosphatase: 88 U/L (ref 38–126)
BUN: 18 mg/dL (ref 6–20)
CO2: 24 mmol/L (ref 22–32)
CREATININE: 0.6 mg/dL (ref 0.44–1.00)
Calcium: 8.5 mg/dL — ABNORMAL LOW (ref 8.9–10.3)
Chloride: 102 mmol/L (ref 101–111)
GFR calc non Af Amer: >60 mL/min (ref >60)
Glucose, Bld: 86 mg/dL (ref 65–99)
Potassium: 4.9 mmol/L (ref 3.5–5.1)
SODIUM: 135 mmol/L (ref 135–145)
Total Bilirubin: 1.3 mg/dL — ABNORMAL HIGH (ref 0.3–1.2)
Total Protein: 6.7 g/dL (ref 6.5–8.1)

## 2017-06-23 LAB — CBC
HCT: 36.1 % (ref 35.0–47.0)
Hemoglobin: 11.8 g/dL — ABNORMAL LOW (ref 12.0–16.0)
MCH: 27 pg (ref 26.0–34.0)
MCHC: 32.8 g/dL (ref 32.0–36.0)
MCV: 82.3 fL (ref 80.0–100.0)
PLATELETS: 211 10*3/uL (ref 150–440)
RBC: 4.38 MIL/uL (ref 3.80–5.20)
RDW: 16.1 % — ABNORMAL HIGH (ref 11.5–14.5)
WBC: 6.3 10*3/uL (ref 3.6–11.0)

## 2017-06-23 LAB — INFLUENZA PANEL BY PCR (TYPE A & B)
Influenza A By PCR: NEGATIVE
Influenza B By PCR: NEGATIVE

## 2017-06-23 MED ORDER — DEXAMETHASONE SODIUM PHOSPHATE 10 MG/ML IJ SOLN
10.0000 mg | Freq: Once | INTRAMUSCULAR | Status: AC
  Administered 2017-06-23: 10 mg via INTRAVENOUS
  Filled 2017-06-23: qty 1

## 2017-06-23 MED ORDER — SODIUM CHLORIDE 0.9 % IV SOLN
1000.0000 mL | Freq: Once | INTRAVENOUS | Status: AC
  Administered 2017-06-23: 1000 mL via INTRAVENOUS

## 2017-06-23 MED ORDER — KETOROLAC TROMETHAMINE 30 MG/ML IJ SOLN
30.0000 mg | Freq: Once | INTRAMUSCULAR | Status: AC
  Administered 2017-06-23: 30 mg via INTRAVENOUS
  Filled 2017-06-23: qty 1

## 2017-06-23 NOTE — ED Notes (Addendum)
See triage note  Once pt was placed in room, she states sx's have been going on for about 1 week  States pain in joints is worse  Had low grade fever last pm  States pain is moving into jaw

## 2017-06-23 NOTE — ED Triage Notes (Signed)
Pt presents with all over joint pain. Hx of arthritis, takes meds for same and not helping. Called PCP but states they did nothing. Pt amb to stat desk with no difficulty noted.

## 2017-06-23 NOTE — ED Notes (Signed)
Patients husband came out of waiting area and expressed concern to this RN that they were sitting in a waiting room with a lot of people who seem to have the flu. This RN offered masks during triage to both husband and patient and again after husbands concern and wife refused mask again. Husband reported "I feel like my wife should be seen soon" Reported to husband patient was just assigned a room and would be taken back at this time

## 2017-06-23 NOTE — ED Provider Notes (Signed)
[facility] Emergency Department Provider Note   ____________________________________________    I have reviewed the triage vital signs and the nursing notes.   HISTORY  Chief Complaint Joint Pain     HPI Connie [name] is a 62 y.o. female with an extensive past medical history as detailed below including chronic pain, fibromyalgia who presents with diffuse joint pain which started last night.  She complains primarily of aching in her elbows wrists and fingers but also in her left big toe.  She does report a history of gout in the past.  Last night she had a temperature of 100.1 but normal this morning.  She reports she always has "inflammation".  No abdominal pain no chest pain no shortness of breath no cough.   Past Medical History:  Diagnosis Date  . Anemia    in the past  . Anxiety   . Arthritis   . Cervical spondylosis with myelopathy 2018  . Chronic pain 2019  . DDD (degenerative disc disease), lumbar   . Depression   . Displacement of cervical intervertebral disc 2018  . Fibromyalgia   . GERD (gastroesophageal reflux disease)   . Heart murmur    not treated or being followed  . Hypertension   . Hypothyroidism   . Lumbar post-laminectomy syndrome 2018  . Lumbosacral radiculitis 2018  . Osteoporosis   . Other long term (current) drug therapy 2019   from pain management  . Pain in the coccyx 2018   disorder of sacrum  . Spondylolisthesis 2018  . Spondylosis of lumbosacral region without myelopathy or radiculopathy 2018  . Spondylosis with myelopathy, lumbar region 2018    Patient Active Problem List   Diagnosis Date Noted  . Joint pain 05/20/2017  . Arthritis 05/20/2017  . History of lumbar fusion 04/23/2017  . Lumbar degenerative disc disease 04/23/2017  . Fibromyalgia 04/23/2017  . Cervicalgia 04/23/2017  . DDD (degenerative disc disease), cervical 04/23/2017  . Chronic pain syndrome 04/23/2017  . Major depressive  disorder 04/23/2017  . Spinal cord stimulator status 04/23/2017  . Complete tear of right rotator cuff 04/17/2017  . Rotator cuff tendinitis, right 04/17/2017  . Rotator cuff arthropathy of both shoulders 04/12/2017  . Degenerative joint disease of cervical and lumbar spine 03/17/2017  . Generalized osteoarthritis of multiple sites 03/17/2017  . Seasonal allergies 03/17/2017  . GERD without esophagitis 01/18/2017  . Fluid collection at surgical site 01/18/2017  . Hypothyroidism 01/18/2017  . Fatty liver 12/08/2016  . Osteoporosis 09/30/2016  . Delusions of parasitosis ([place]) 05/27/2016  . Essential hypertension 04/09/2016  . Chiari malformation type I ([place]) 03/24/2016  . Obesity, morbid ([place]) 03/24/2016  . Chronic midline low back pain 03/28/2015  . 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 Medication-induced delirium, acute, hyperactive ([place]) 11/17/2014  . Fatigue 05/08/2014  . Lumbar post-laminectomy syndrome 05/08/2014  . Lumbar radiculopathy 05/08/2014  . Cervical radiculopathy 05/08/2014  . H/O gastric bypass 03/03/2014  . Dysphagia, oropharyngeal phase 05/18/2013  . Acquired spondylolisthesis 08/23/2012  . Nonunion of fracture 08/23/2012  . Spinal stenosis of lumbar region without neurogenic claudication 08/23/2012  . Chronic constipation 07/26/2012  . Dry eyes, bilateral 07/26/2012  . Leg cramps 07/26/2012  . Diastolic dysfunction 87/68/1157  . Daytime somnolence 06/02/2012  . Insomnia 05/11/2012  . Lumbar spondylosis 04/06/2012  . Lumbar pseudoarthrosis 04/06/2012  . Iron deficiency 01/14/2012  . Adiposis dolorosa 12/16/2011  . Chronic pain 12/16/2011  . Edema 12/16/2011  . Internal hemorrhoid 12/16/2011  .  OA (osteoarthritis) 12/16/2011    Past Surgical History:  Procedure Laterality Date  . ABDOMINAL HYSTERECTOMY  2000  . APPENDECTOMY    . arm surgery  2002  . CESAREAN SECTION     x 2  . CHOLECYSTECTOMY    . epidural injection  2018   steroids  . GASTRIC BYPASS   2004  . JOINT REPLACEMENT Bilateral 2005   knees  . LUMBAR FUSION  2016   x 2  . SHOULDER ARTHROSCOPY WITH ROTATOR CUFF REPAIR Right 2008   x 3  . SPINAL CORD STIMULATOR IMPLANT  2018   [facility] scientific  . TONSILLECTOMY      Prior to Admission medications   Medication Sig Start Date End Date Taking? Authorizing Provider  buPROPion (WELLBUTRIN XL) 300 MG 24 hr tablet Take 1 tablet (300 mg total) by mouth daily. Patient taking differently: Take 300 mg by mouth daily. In the morning 05/20/17   Ursula Alert, MD  celecoxib (CELEBREX) 200 MG capsule Take 200 mg by mouth once daily in the morning 04/05/17   [provider]  diphenhydrAMINE (BENADRYL) 25 MG tablet Take 50 mg by mouth daily as needed for allergies.    [provider]  fluticasone (FLONASE) 50 MCG/ACT nasal spray Place 2 sprays into both nostrils daily as needed for allergies or rhinitis.    [provider]  furosemide (LASIX) 40 MG tablet Take 40 mg by mouth daily as needed for fluid.    [provider]  gabapentin (NEURONTIN) 300 MG capsule Take 1 capsule (300 mg total) by mouth 2 (two) times daily. 04/23/17   [name], MD  glycopyrrolate (ROBINUL) 1 MG tablet Take 1 mg by mouth 2 (two) times daily.  04/12/17   [provider]  ibuprofen (ADVIL,MOTRIN) 200 MG tablet Take 400 mg by mouth daily as needed for headache or moderate pain.    [provider]  levorphanol (LEVODROMORAN) 2 MG tablet 3 mg in am, 2 mg in afternoon, and 2mg  evening For chronic pain To last 30 days from fill date Fill on or after: 06/12/17, 07/12/17 06/04/17   [name], MD  levothyroxine (SYNTHROID, LEVOTHROID) 25 MCG tablet Take 25 mcg by mouth once daily before breakfast 04/06/17   [provider]  loratadine (CLARITIN) 10 MG tablet Take 10 mgs by mouth once daily in the evening    [provider]  metoprolol succinate (TOPROL-XL) 100 MG 24 hr tablet Take 100 mg by mouth once  daily in the evening 04/14/17   [provider]  ondansetron (ZOFRAN-ODT) 8 MG disintegrating tablet Take 8 mg by mouth daily as needed for nausea or vomiting.    [provider]  pantoprazole (PROTONIX) 40 MG tablet Take 40 mg by mouth once daily in the evening 01/22/17   [provider]  vitamin B-12 (CYANOCOBALAMIN) 1000 MCG tablet Take 1,000 mcg by mouth daily.    [provider]     Allergies Tape  Family History  Problem Relation Age of Onset  . Kidney disease Mother   . Cancer Mother   . Anxiety disorder Mother   . Depression Mother   . Cancer Father   . Hypertension Father   . Cancer Brother     Social History Social History   Tobacco Use  . Smoking status: Former Smoker    Packs/day: 0.50    Types: Cigarettes    Last attempt to quit: 05/21/2007    Years since quitting: 10.0  . Smokeless  tobacco: Never Used  Substance Use Topics  . Alcohol use: No    Frequency: Never  . Drug use: No    Comment: managed by pain clinic    Review of Systems  Constitutional: No fatigue Eyes: No visual changes.  ENT: Sore throat 3 weeks ago Cardiovascular: Denies chest pain. Respiratory: Denies shortness of breath.  No cough Gastrointestinal: No abdominal pain.    Genitourinary: Negative for dysuria. Musculoskeletal: Chronic back pain, diffuse joint pain however primarily in the bilateral elbows wrists and hands Skin: Negative for rash. Neurological: Negative for headaches    ____________________________________________   PHYSICAL EXAM:  VITAL SIGNS: ED Triage Vitals  Enc Vitals Group     BP 06/23/17 0842 125/71     Pulse Rate 06/23/17 0842 67     Resp 06/23/17 0842 16     Temp 06/23/17 0842 98.2 F (36.8 C)     Temp Source 06/23/17 0842 Oral     SpO2 06/23/17 0842 96 %     Weight 06/23/17 0826 93 kg (205 lb)     Height 06/23/17 0842 1.549 m (5\' 1" )     Head Circumference --      Peak Flow --      Pain Score 06/23/17 0826 10      Pain Loc --      Pain Edu? --      Excl. in [place]? --     Constitutional: Alert and oriented. No acute distress.  Eyes: Conjunctivae are normal.  Nose: No congestion/rhinnorhea. Mouth/Throat: Mucous membranes are moist.   Neck:  Painless ROM Cardiovascular: Normal rate, regular rhythm.   Good peripheral circulation. Respiratory: Normal respiratory effort.  No retractions. Gastrointestinal: Soft and nontender. No distention.  Genitourinary: deferred Musculoskeletal: Full range of motion of all joints without significant swelling but some discomfort with range of motion primarily the bilateral elbows wrists and flexing of the fingers.  No erythema or significant swelling noted. Neurologic:  Normal speech and language. No gross focal neurologic deficits are appreciated.  Skin:  Skin is warm, dry and intact.  Psychiatric: Mood and affect are normal. Speech and behavior are normal.  ____________________________________________   LABS (all labs ordered are listed, but only abnormal results are displayed)  Labs Reviewed  CBC - Abnormal; Notable for the following components:      Result Value   Hemoglobin 11.8 (*)    RDW 16.1 (*)    All other components within normal limits  COMPREHENSIVE METABOLIC PANEL - Abnormal; Notable for the following components:   Calcium 8.5 (*)    ALT 9 (*)    Total Bilirubin 1.3 (*)    All other components within normal limits  INFLUENZA PANEL BY PCR (TYPE A & B)   ____________________________________________  EKG  None ____________________________________________  RADIOLOGY  None ____________________________________________   PROCEDURES  Procedure(s) performed: No  Procedures   Critical Care performed: No ____________________________________________   INITIAL IMPRESSION / ASSESSMENT AND PLAN / ED COURSE  Pertinent labs & imaging results that were available during my care of the patient were reviewed by me and considered in my medical  decision making (see chart for details).  Unclear cause of patient's joint pain, she does have a long history of chronic pain as well as arthritis as well as fibromyalgia as well as gout.  Do not believe this is gout as no significant swelling or erythema.  We will treat with IV Toradol/Decadron, check labs, influenza if reassuring and outpatient follow-up  ----------------------------------------- 12:36  PM on 06/23/2017 -----------------------------------------  Patient had significant improvement in pain after Toradol and Decadron  Will discharge with outpatient follow-up with rheumatology    ____________________________________________   FINAL CLINICAL IMPRESSION(S) / ED DIAGNOSES  Final diagnoses:  Bilateral elbow joint pain  Wrist joint stiffness, bilateral        Note:  This document was prepared using Dragon voice recognition software and may include unintentional dictation errors.    Lavonia Drafts, MD 06/23/17 [phone]

## 2017-06-26 ENCOUNTER — Ambulatory Visit: Payer: Managed Care, Other (non HMO)

## 2017-06-26 ENCOUNTER — Ambulatory Visit: Admission: RE | Admit: 2017-06-26 | Payer: Managed Care, Other (non HMO) | Source: Ambulatory Visit

## 2017-06-26 DIAGNOSIS — R7982 Elevated C-reactive protein (CRP): Secondary | ICD-10-CM | POA: Insufficient documentation

## 2017-06-26 DIAGNOSIS — R519 Headache, unspecified: Secondary | ICD-10-CM | POA: Insufficient documentation

## 2017-06-26 DIAGNOSIS — R51 Headache: Secondary | ICD-10-CM

## 2017-06-26 DIAGNOSIS — R6884 Jaw pain: Secondary | ICD-10-CM | POA: Insufficient documentation

## 2017-06-26 DIAGNOSIS — M79641 Pain in right hand: Secondary | ICD-10-CM | POA: Insufficient documentation

## 2017-06-26 DIAGNOSIS — M25619 Stiffness of unspecified shoulder, not elsewhere classified: Secondary | ICD-10-CM | POA: Insufficient documentation

## 2017-06-26 DIAGNOSIS — M79642 Pain in left hand: Secondary | ICD-10-CM

## 2017-06-26 DIAGNOSIS — Z13820 Encounter for screening for osteoporosis: Secondary | ICD-10-CM | POA: Insufficient documentation

## 2017-07-07 ENCOUNTER — Encounter: Admission: RE | Payer: Self-pay | Source: Ambulatory Visit

## 2017-07-07 ENCOUNTER — Inpatient Hospital Stay: Admission: RE | Admit: 2017-07-07 | Payer: Managed Care, Other (non HMO) | Source: Ambulatory Visit | Admitting: Surgery

## 2017-07-07 SURGERY — ARTHROPLASTY, SHOULDER, TOTAL, REVERSE
Anesthesia: Choice | Laterality: Right

## 2017-07-13 ENCOUNTER — Encounter: Payer: Self-pay | Admitting: Psychiatry

## 2017-07-13 ENCOUNTER — Ambulatory Visit (INDEPENDENT_AMBULATORY_CARE_PROVIDER_SITE_OTHER): Payer: 59 | Admitting: Psychiatry

## 2017-07-13 ENCOUNTER — Other Ambulatory Visit: Payer: Self-pay

## 2017-07-13 VITALS — BP 135/77 | HR 71 | Temp 98.3°F | Wt 204.6 lb

## 2017-07-13 DIAGNOSIS — F5105 Insomnia due to other mental disorder: Secondary | ICD-10-CM

## 2017-07-13 DIAGNOSIS — N951 Menopausal and female climacteric states: Secondary | ICD-10-CM | POA: Insufficient documentation

## 2017-07-13 DIAGNOSIS — F331 Major depressive disorder, recurrent, moderate: Secondary | ICD-10-CM | POA: Diagnosis not present

## 2017-07-13 DIAGNOSIS — B372 Candidiasis of skin and nail: Secondary | ICD-10-CM | POA: Insufficient documentation

## 2017-07-13 MED ORDER — TRAZODONE HCL 50 MG PO TABS: 25.0000 mg | ORAL_TABLET | Freq: Every day | ORAL | 1 refills | Status: DC

## 2017-07-13 NOTE — Progress Notes (Signed)
[facility] MD OP Progress Note  07/13/2017 5:05 PM Connie [name]  MRN:  [id number]  Chief Complaint: 'I am not doing well.' Chief Complaint    Follow-up; Medication Refill      HPI: Connie [name] is a 62 year old Caucasian female, married, on SSD, lives in [place], has a history of depression, history of delusional parasitosis, chronic pain, fibromyalgia, hypothyroidism, hypertension, presented to the clinic today for a follow-up visit.  Connie [name] has limited mobility and walks with the help of a walker, stooped over while walking.  She struggles with chronic pain syndrome.  Patient today reports that she has been struggling.  She reports her mother who has dementia as well as psychosis recently moved in with her.  She reports that ever since then she had to move out of her bedroom and give up her bed and her room to her mother.  She reports she tried sleeping on a recliner as well as an air mattress.  She reports it is hard for her to sleep because of the same.  Her mother also needs 24-hour care because of her memory issues.  She reports that she hence has been struggling.  Patient also got recently diagnosed with an autoimmune disorder.  She reports she woke up one day and could not move her extremities.  She reports she was taken to the emergency department and they recommended that she do a temporal artery biopsy.  She reports that they are trying to rule out giant cell arteritis.  Pt reports she is awaiting the biopsy report.  She is currently on prednisone 60 mg and has been taking it since the past several weeks.  Patient reports she wonders whether the prednisone also has something to do with her restlessness at night.  She reports she  struggles with insomnia on a regular basis.  She reports she may have had the insomnia even before her mother moved in.  She reports she has been on trazodone in the past, years ago and that may have helped.  She agrees to restarting the trazodone again.  She reports she has a  psychotherapy appointment scheduled with Ms. Peacock in April.  She reports she had to reschedule her last appointment because she had to go to the emergency department for her other problems.  Visit Diagnosis:    ICD-10-CM   1. MDD (major depressive disorder), recurrent episode, moderate (HCC) F33.1 traZODone (DESYREL) 50 MG tablet  2. Insomnia due to mental disorder F51.05 traZODone (DESYREL) 50 MG tablet    Past Psychiatric History: Admitted to mental health hospital at [facility] for 3 days in 2016 for delusional parasitosis.  She reports being treated by a psychiatrist at [facility] in the past for depression.  Denies suicide attempt in the past.  Past trials of Latuda, Wellbutrin.  Past Medical History:  Past Medical History:  Diagnosis Date  . Anemia    in the past  . Anxiety   . Arthritis   . Cervical spondylosis with myelopathy 2018  . Chronic pain 2019  . DDD (degenerative disc disease), lumbar   . Depression   . Displacement of cervical intervertebral disc 2018  . Fibromyalgia   . GERD (gastroesophageal reflux disease)   . Heart murmur    not treated or being followed  . Hypertension   . Hypothyroidism   . Lumbar post-laminectomy syndrome 2018  . Lumbosacral radiculitis 2018  . Osteoporosis   . Other long term (current) drug therapy 2019   from pain management  .  Pain in the coccyx 2018   disorder of sacrum  . Spondylolisthesis 2018  . Spondylosis of lumbosacral region without myelopathy or radiculopathy 2018  . Spondylosis with myelopathy, lumbar region 2018    Past Surgical History:  Procedure Laterality Date  . ABDOMINAL HYSTERECTOMY  2000  . APPENDECTOMY    . arm surgery  2002  . CESAREAN SECTION     x 2  . CHOLECYSTECTOMY    . epidural injection  2018   steroids  . GASTRIC BYPASS  2004  . JOINT REPLACEMENT Bilateral 2005   knees  . LUMBAR FUSION  2016   x 2  . SHOULDER ARTHROSCOPY WITH ROTATOR CUFF REPAIR Right 2008   x 3  . SPINAL CORD  STIMULATOR IMPLANT  2018   [facility] scientific  . TONSILLECTOMY      Family Psychiatric History: Mother-depression, anxiety. Family History:  Family History  Problem Relation Age of Onset  . Kidney disease Mother   . Cancer Mother   . Anxiety disorder Mother   . Depression Mother   . Cancer Father   . Hypertension Father   . Cancer Brother    Substance abuse history: Denies  Social History: Married.  She moved from [place] to [place] in November 2018.  She lives with her husband in [place].  She has 2 children, a son and a daughter who are adults , they are doing well.  She used to work in the past but currently disabled due to her chronic pain and physical disability from the same. Social History   Socioeconomic History  . Marital status: Married    Spouse name: brady   . Number of children: 2  . Years of education: None  . Highest education level: High school graduate  Social Needs  . Financial resource strain: Somewhat hard  . Food insecurity - worry: Never true  . Food insecurity - inability: Never true  . Transportation needs - medical: No  . Transportation needs - non-medical: No  Occupational History    Comment: disabled   Tobacco Use  . Smoking status: Former Smoker    Packs/day: 0.50    Types: Cigarettes    Last attempt to quit: 05/21/2007    Years since quitting: 10.1  . Smokeless tobacco: Never Used  Substance and Sexual Activity  . Alcohol use: No    Frequency: Never  . Drug use: No    Comment: managed by pain clinic  . Sexual activity: Not Currently  Other Topics Concern  . None  Social History Narrative  . None    Allergies:  Allergies  Allergen Reactions  . Tape Rash    Skin peels off and blisters.  Probably was clear plastic tape.    Metabolic Disorder Labs: No results found for: HGBA1C, MPG No results found for: PROLACTIN No results found for: CHOL, TRIG, HDL, CHOLHDL, VLDL, LDLCALC No results found for: TSH  Therapeutic  Level Labs: No results found for: LITHIUM No results found for: VALPROATE No components found for:  CBMZ  Current Medications: Current Outpatient Medications  Medication Sig Dispense Refill  . buPROPion (WELLBUTRIN XL) 300 MG 24 hr tablet Take 1 tablet (300 mg total) by mouth daily. (Patient taking differently: Take 300 mg by mouth daily. In the morning) 90 tablet 0  . diphenhydrAMINE (BENADRYL) 25 MG tablet Take 50 mg by mouth daily as needed for allergies.    . furosemide (LASIX) 40 MG tablet Take 40 mg by mouth daily as  needed for fluid.    Marland Kitchen gabapentin (NEURONTIN) 300 MG capsule Take 1 capsule (300 mg total) by mouth 2 (two) times daily. 60 capsule 2  . glycopyrrolate (ROBINUL) 1 MG tablet Take 1 mg by mouth 2 (two) times daily.   3  . ibuprofen (ADVIL,MOTRIN) 200 MG tablet Take 400 mg by mouth daily as needed for headache or moderate pain.    Marland Kitchen levorphanol (LEVODROMORAN) 2 MG tablet 3 mg in am, 2 mg in afternoon, and 2mg  evening For chronic pain To last 30 days from fill date Fill on or after: 06/12/17, 07/12/17 105 tablet 0  . levothyroxine (SYNTHROID, LEVOTHROID) 25 MCG tablet Take 25 mcg by mouth once daily before breakfast  0  . loratadine (CLARITIN) 10 MG tablet Take 10 mgs by mouth once daily in the evening    . metoprolol succinate (TOPROL-XL) 100 MG 24 hr tablet Take 100 mg by mouth once daily in the evening  0  . metroNIDAZOLE (FLAGYL) 500 MG tablet   0  . montelukast (SINGULAIR) 10 MG tablet Take by mouth.    . ondansetron (ZOFRAN-ODT) 8 MG disintegrating tablet Take 8 mg by mouth daily as needed for nausea or vomiting.    . pantoprazole (PROTONIX) 40 MG tablet Take 40 mg by mouth once daily in the evening    . predniSONE (DELTASONE) 20 MG tablet Take 3 tabs (60 mg ) in the morning , 30 days    . vitamin B-12 (CYANOCOBALAMIN) 1000 MCG tablet Take 1,000 mcg by mouth daily.    . traZODone (DESYREL) 50 MG tablet Take 0.5-1 tablets (25-50 mg total) by mouth at bedtime. 30 tablet 1    No current facility-administered medications for this visit.      Musculoskeletal: Strength & Muscle Tone: within normal limits Gait & Station: walks with a walker Patient leans: Front  Psychiatric Specialty Exam: Review of Systems  Musculoskeletal: Positive for back pain.  Psychiatric/Behavioral: Positive for depression. The patient is nervous/anxious and has insomnia.   All other systems reviewed and are negative.   Blood pressure 135/77, pulse 71, temperature 98.3 F (36.8 C), temperature source Oral, weight 204 lb 9.6 oz (92.8 kg).Body mass index is 38.66 kg/m.  General Appearance: Casual  Eye Contact:  Fair  Speech:  Clear and Coherent  Volume:  Normal  Mood:  Anxious and Dysphoric  Affect:  Congruent  Thought Process:  Goal Directed and Descriptions of Associations: Intact  Orientation:  Full (Time, Place, and Person)  Thought Content: Logical   Suicidal Thoughts:  No  Homicidal Thoughts:  No  Memory:  Immediate;   Fair Recent;   Fair Remote;   Fair  Judgement:  Fair  Insight:  Fair  Psychomotor Activity:  limited due to her physical limitation  Concentration:  Concentration: Fair and Attention Span: Fair  Recall:  [employer] of Knowledge: Fair  Language: Fair  Akathisia:  No  Handed:  Right  AIMS (if indicated): NA  Assets:  Communication Skills Desire for Improvement Financial Resources/Insurance Housing Social Support Transportation  ADL's:  Intact  Cognition: WNL  Sleep:  Poor   Screenings: PHQ2-9     Clinical Support from 06/04/2017 in [facility] Clinical Support from 05/11/2017 in [facility] Office Visit from 04/23/2017 in [facility]  PHQ-2 Total Score  0  5  0  PHQ-9 Total Score  No data  13  No data  Assessment and Plan: Connie [name] is a 62 year old Caucasian female who has a history of depression, chronic pain,  GERD, hypothyroidism, hypertension, fibromyalgia, presented to the clinic today for a follow-up visit.  Patient continues to struggle with some depression, anxiety as well as sleep problems.  She has several psychosocial stressors including her chronic pain issues, immobility, her mother who has dementia as well as psychosis who moved in with her, her own recent medical issues, pending biopsy results and so on.  She however is motivated to pursue psychotherapy as well as medication management.  She remains compliant on her medications.  She has a therapy appointment scheduled with Ms. Peacock soon.  Discussed plan as noted below.  Plan MDD Continue Wellbutrin XL 300 mg p.o. daily Start trazodone 25-50 mg p.o. nightly. Referred for CBT with Ms. Peacock.  Insomnia Likely due to her several psychosocial stressors, chronic pain, recent situation at home. Discussed with her to change her lifestyle, get help to take care of her mother who has dementia.  She currently sleeps on an air mattress or a recliner which may not be the right situation for her with her several medical problems as well as chronic pain syndrome. Start trazodone 25-50 mg p.o. nightly.  She also has vitamin B12 deficiency as well as hypothyroidism which is being managed by PMD.  Follow-up in clinic in 3 weeks or sooner if needed.  More than 50 % of the time was spent for psychoeducation and supportive psychotherapy and care coordination.  This note was generated in part or whole with voice recognition software. Voice recognition is usually quite accurate but there are transcription errors that can and very often do occur. I apologize for any typographical errors that were not detected and corrected.        Ursula Alert, MD 07/14/2017, 8:40 AM

## 2017-07-13 NOTE — Patient Instructions (Signed)
Trazodone tablets What is this medicine? TRAZODONE (TRAZ oh done) is used to treat depression. This medicine may be used for other purposes; ask your health care provider or pharmacist if you have questions. COMMON BRAND NAME(S): Desyrel What should I tell my health care provider before I take this medicine? They need to know if you have any of these conditions: -attempted suicide or thinking about it -bipolar disorder -bleeding problems -glaucoma -heart disease, or previous heart attack -irregular heart beat -kidney or liver disease -low levels of sodium in the blood -an unusual or allergic reaction to trazodone, other medicines, foods, dyes or preservatives -pregnant or trying to get pregnant -breast-feeding How should I use this medicine? Take this medicine by mouth with a glass of water. Follow the directions on the prescription label. Take this medicine shortly after a meal or a light snack. Take your medicine at regular intervals. Do not take your medicine more often than directed. Do not stop taking this medicine suddenly except upon the advice of your doctor. Stopping this medicine too quickly may cause serious side effects or your condition may worsen. A special MedGuide will be given to you by the pharmacist with each prescription and refill. Be sure to read this information carefully each time. Talk to your pediatrician regarding the use of this medicine in children. Special care may be needed. Overdosage: If you think you have taken too much of this medicine contact a poison control center or emergency room at once. NOTE: This medicine is only for you. Do not share this medicine with others. What if I miss a dose? If you miss a dose, take it as soon as you can. If it is almost time for your next dose, take only that dose. Do not take double or extra doses. What may interact with this medicine? Do not take this medicine with any of the following medications: -certain medicines  for fungal infections like fluconazole, itraconazole, ketoconazole, posaconazole, voriconazole -cisapride -dofetilide -dronedarone -linezolid -MAOIs like Carbex, Eldepryl, Marplan, Nardil, and Parnate -mesoridazine -methylene blue (injected into a vein) -pimozide -saquinavir -thioridazine -ziprasidone This medicine may also interact with the following medications: -alcohol -antiviral medicines for HIV or AIDS -aspirin and aspirin-like medicines -barbiturates like phenobarbital -certain medicines for blood pressure, heart disease, irregular heart beat -certain medicines for depression, anxiety, or psychotic disturbances -certain medicines for migraine headache like almotriptan, eletriptan, frovatriptan, naratriptan, rizatriptan, sumatriptan, zolmitriptan -certain medicines for seizures like carbamazepine and phenytoin -certain medicines for sleep -certain medicines that treat or prevent blood clots like dalteparin, enoxaparin, warfarin -digoxin -fentanyl -lithium -NSAIDS, medicines for pain and inflammation, like ibuprofen or naproxen -other medicines that prolong the QT interval (cause an abnormal heart rhythm) -rasagiline -supplements like St. John's wort, kava kava, valerian -tramadol -tryptophan This list may not describe all possible interactions. Give your health care provider a list of all the medicines, herbs, non-prescription drugs, or dietary supplements you use. Also tell them if you smoke, drink alcohol, or use illegal drugs. Some items may interact with your medicine. What should I watch for while using this medicine? Tell your doctor if your symptoms do not get better or if they get worse. Visit your doctor or health care professional for regular checks on your progress. Because it may take several weeks to see the full effects of this medicine, it is important to continue your treatment as prescribed by your doctor. Patients and their families should watch out for new  or worsening thoughts of suicide or depression. Also   watch out for sudden changes in feelings such as feeling anxious, agitated, panicky, irritable, hostile, aggressive, impulsive, severely restless, overly excited and hyperactive, or not being able to sleep. If this happens, especially at the beginning of treatment or after a change in dose, call your health care professional. You may get drowsy or dizzy. Do not drive, use machinery, or do anything that needs mental alertness until you know how this medicine affects you. Do not stand or sit up quickly, especially if you are an older patient. This reduces the risk of dizzy or fainting spells. Alcohol may interfere with the effect of this medicine. Avoid alcoholic drinks. This medicine may cause dry eyes and blurred vision. If you wear contact lenses you may feel some discomfort. Lubricating drops may help. See your eye doctor if the problem does not go away or is severe. Your mouth may get dry. Chewing sugarless gum, sucking hard candy and drinking plenty of water may help. Contact your doctor if the problem does not go away or is severe. What side effects may I notice from receiving this medicine? Side effects that you should report to your doctor or health care professional as soon as possible: -allergic reactions like skin rash, itching or hives, swelling of the face, lips, or tongue -elevated mood, decreased need for sleep, racing thoughts, impulsive behavior -confusion -fast, irregular heartbeat -feeling faint or lightheaded, falls -feeling agitated, angry, or irritable -loss of balance or coordination -painful or prolonged erections -restlessness, pacing, inability to keep still -suicidal thoughts or other mood changes -tremors -trouble sleeping -seizures -unusual bleeding or bruising Side effects that usually do not require medical attention (report to your doctor or health care professional if they continue or are bothersome): -change in  sex drive or performance -change in appetite or weight -constipation -headache -muscle aches or pains -nausea This list may not describe all possible side effects. Call your doctor for medical advice about side effects. You may report side effects to FDA at 1-800-FDA-1088. Where should I keep my medicine? Keep out of the reach of children. Store at room temperature between 15 and 30 degrees C (59 to 86 degrees F). Protect from light. Keep container tightly closed. Throw away any unused medicine after the expiration date. NOTE: This sheet is a summary. It may not cover all possible information. If you have questions about this medicine, talk to your doctor, pharmacist, or health care provider.  2018 Elsevier/Gold Standard (2015-09-13 16:57:05)  

## 2017-07-14 ENCOUNTER — Encounter: Payer: Self-pay | Admitting: Psychiatry

## 2017-07-21 ENCOUNTER — Other Ambulatory Visit: Payer: Self-pay | Admitting: Student in an Organized Health Care Education/Training Program

## 2017-07-21 ENCOUNTER — Other Ambulatory Visit: Payer: Self-pay | Admitting: Internal Medicine

## 2017-07-21 DIAGNOSIS — Z1231 Encounter for screening mammogram for malignant neoplasm of breast: Secondary | ICD-10-CM

## 2017-07-22 DIAGNOSIS — E559 Vitamin D deficiency, unspecified: Secondary | ICD-10-CM | POA: Insufficient documentation

## 2017-07-24 ENCOUNTER — Ambulatory Visit
Admission: RE | Admit: 2017-07-24 | Discharge: 2017-07-24 | Disposition: A | Discharge status: Home / Self Care | Payer: Managed Care, Other (non HMO) | Source: Ambulatory Visit | Attending: Student | Admitting: Student

## 2017-07-24 ENCOUNTER — Ambulatory Visit: Admission: RE | Admit: 2017-07-24 | Payer: Managed Care, Other (non HMO) | Source: Ambulatory Visit

## 2017-07-24 DIAGNOSIS — M5124 Other intervertebral disc displacement, thoracic region: Secondary | ICD-10-CM | POA: Insufficient documentation

## 2017-07-24 DIAGNOSIS — M545 Low back pain: Principal | ICD-10-CM

## 2017-07-24 DIAGNOSIS — M48061 Spinal stenosis, lumbar region without neurogenic claudication: Secondary | ICD-10-CM | POA: Insufficient documentation

## 2017-07-24 DIAGNOSIS — M542 Cervicalgia: Principal | ICD-10-CM

## 2017-07-24 DIAGNOSIS — M79604 Pain in right leg: Secondary | ICD-10-CM

## 2017-07-24 DIAGNOSIS — G8929 Other chronic pain: Secondary | ICD-10-CM | POA: Insufficient documentation

## 2017-07-24 DIAGNOSIS — G959 Disease of spinal cord, unspecified: Secondary | ICD-10-CM

## 2017-07-24 DIAGNOSIS — M4802 Spinal stenosis, cervical region: Secondary | ICD-10-CM | POA: Diagnosis not present

## 2017-07-24 DIAGNOSIS — M4316 Spondylolisthesis, lumbar region: Secondary | ICD-10-CM | POA: Insufficient documentation

## 2017-07-24 MED ORDER — IOPAMIDOL (ISOVUE-M 300) INJECTION 61%
15.0000 mL | Freq: Once | INTRAMUSCULAR | Status: AC | PRN
  Administered 2017-07-24: 15 mL via INTRATHECAL

## 2017-07-24 MED ORDER — ACETAMINOPHEN 325 MG PO TABS
650.0000 mg | ORAL_TABLET | ORAL | Status: DC | PRN
  Filled 2017-07-24: qty 2

## 2017-07-24 NOTE — Procedures (Signed)
20 g spinal needle inserted at L 3-4 level.    Injected 13 ml Isovue 300 nonionic intrathecally     Post injection images of lumbar and cervical regions.    Pt sent to CT in stable condition.    Report to follow in [facility].

## 2017-07-28 ENCOUNTER — Other Ambulatory Visit
Admission: RE | Admit: 2017-07-28 | Discharge: 2017-07-28 | Disposition: A | Discharge status: Home / Self Care | Payer: Managed Care, Other (non HMO) | Source: Ambulatory Visit | Attending: Student | Admitting: Student

## 2017-07-28 DIAGNOSIS — K529 Noninfective gastroenteritis and colitis, unspecified: Secondary | ICD-10-CM | POA: Insufficient documentation

## 2017-07-30 LAB — PANCREATIC ELASTASE, FECAL: PANCREATIC ELASTASE-1, STL: 177 ug Elast./g — AB (ref >200)

## 2017-08-04 ENCOUNTER — Encounter: Payer: Self-pay | Admitting: Student in an Organized Health Care Education/Training Program

## 2017-08-04 ENCOUNTER — Other Ambulatory Visit: Payer: Self-pay

## 2017-08-04 ENCOUNTER — Ambulatory Visit
Payer: Managed Care, Other (non HMO) | Attending: Student in an Organized Health Care Education/Training Program | Admitting: Student in an Organized Health Care Education/Training Program

## 2017-08-04 VITALS — BP 141/85 | HR 65 | Temp 99.0°F | Resp 18 | Ht 61.0 in | Wt 204.0 lb

## 2017-08-04 DIAGNOSIS — M47812 Spondylosis without myelopathy or radiculopathy, cervical region: Secondary | ICD-10-CM

## 2017-08-04 DIAGNOSIS — M47817 Spondylosis without myelopathy or radiculopathy, lumbosacral region: Secondary | ICD-10-CM | POA: Diagnosis not present

## 2017-08-04 DIAGNOSIS — G894 Chronic pain syndrome: Secondary | ICD-10-CM | POA: Diagnosis not present

## 2017-08-04 DIAGNOSIS — M51369 Other intervertebral disc degeneration, lumbar region without mention of lumbar back pain or lower extremity pain: Secondary | ICD-10-CM

## 2017-08-04 DIAGNOSIS — D649 Anemia, unspecified: Secondary | ICD-10-CM | POA: Diagnosis not present

## 2017-08-04 DIAGNOSIS — Z79890 Hormone replacement therapy (postmenopausal): Secondary | ICD-10-CM | POA: Insufficient documentation

## 2017-08-04 DIAGNOSIS — Z79891 Long term (current) use of opiate analgesic: Secondary | ICD-10-CM | POA: Diagnosis not present

## 2017-08-04 DIAGNOSIS — M542 Cervicalgia: Secondary | ICD-10-CM

## 2017-08-04 DIAGNOSIS — Z981 Arthrodesis status: Secondary | ICD-10-CM | POA: Diagnosis not present

## 2017-08-04 DIAGNOSIS — Z9689 Presence of other specified functional implants: Secondary | ICD-10-CM | POA: Diagnosis not present

## 2017-08-04 DIAGNOSIS — M47816 Spondylosis without myelopathy or radiculopathy, lumbar region: Secondary | ICD-10-CM | POA: Diagnosis not present

## 2017-08-04 DIAGNOSIS — M961 Postlaminectomy syndrome, not elsewhere classified: Secondary | ICD-10-CM | POA: Insufficient documentation

## 2017-08-04 DIAGNOSIS — M4716 Other spondylosis with myelopathy, lumbar region: Secondary | ICD-10-CM | POA: Diagnosis not present

## 2017-08-04 DIAGNOSIS — M797 Fibromyalgia: Secondary | ICD-10-CM

## 2017-08-04 DIAGNOSIS — M5136 Other intervertebral disc degeneration, lumbar region: Secondary | ICD-10-CM | POA: Insufficient documentation

## 2017-08-04 DIAGNOSIS — M47892 Other spondylosis, cervical region: Secondary | ICD-10-CM | POA: Insufficient documentation

## 2017-08-04 DIAGNOSIS — M5030 Other cervical disc degeneration, unspecified cervical region: Secondary | ICD-10-CM

## 2017-08-04 DIAGNOSIS — K219 Gastro-esophageal reflux disease without esophagitis: Secondary | ICD-10-CM | POA: Insufficient documentation

## 2017-08-04 DIAGNOSIS — F419 Anxiety disorder, unspecified: Secondary | ICD-10-CM | POA: Diagnosis not present

## 2017-08-04 DIAGNOSIS — I10 Essential (primary) hypertension: Secondary | ICD-10-CM | POA: Insufficient documentation

## 2017-08-04 DIAGNOSIS — M47896 Other spondylosis, lumbar region: Secondary | ICD-10-CM | POA: Diagnosis not present

## 2017-08-04 DIAGNOSIS — Z87891 Personal history of nicotine dependence: Secondary | ICD-10-CM | POA: Insufficient documentation

## 2017-08-04 DIAGNOSIS — Z5181 Encounter for therapeutic drug level monitoring: Secondary | ICD-10-CM | POA: Diagnosis not present

## 2017-08-04 DIAGNOSIS — Z79899 Other long term (current) drug therapy: Secondary | ICD-10-CM | POA: Insufficient documentation

## 2017-08-04 DIAGNOSIS — Z888 Allergy status to other drugs, medicaments and biological substances status: Secondary | ICD-10-CM | POA: Diagnosis not present

## 2017-08-04 DIAGNOSIS — E039 Hypothyroidism, unspecified: Secondary | ICD-10-CM | POA: Diagnosis not present

## 2017-08-04 DIAGNOSIS — F329 Major depressive disorder, single episode, unspecified: Secondary | ICD-10-CM

## 2017-08-04 DIAGNOSIS — M4712 Other spondylosis with myelopathy, cervical region: Secondary | ICD-10-CM | POA: Insufficient documentation

## 2017-08-04 MED ORDER — LEVORPHANOL TARTRATE 2 MG PO TABS: ORAL_TABLET | ORAL | 0 refills | Status: DC

## 2017-08-04 NOTE — Patient Instructions (Signed)
Facet Blocks Patient Information  Description: The facets are joints in the spine between the vertebrae.  Like any joints in the body, facets can become irritated and painful.  Arthritis can also effect the facets.  By injecting steroids and local anesthetic in and around these joints, we can temporarily block the nerve supply to them.  Steroids act directly on irritated nerves and tissues to reduce selling and inflammation which often leads to decreased pain.  Facet blocks may be done anywhere along the spine from the neck to the low back depending upon the location of your pain.   After numbing the skin with local anesthetic (like Novocaine), a small needle is passed onto the facet joints under x-ray guidance.  You may experience a sensation of pressure while this is being done.  The entire block usually lasts about 15-25 minutes.   Conditions which may be treated by facet blocks:   Low back/buttock pain  Neck/shoulder pain  Certain types of headaches  Preparation for the injection:  1. Do not eat any solid food or dairy products within 8 hours of your appointment. 2. You may drink clear liquid up to 3 hours before appointment.  Clear liquids include water, black coffee, juice or soda.  No milk or cream please. 3. You may take your regular medication, including pain medications, with a sip of water before your appointment.  Diabetics should hold regular insulin (if taken separately) and take 1/2 normal NPH dose the morning of the procedure.  Carry some sugar containing items with you to your appointment. 4. A driver must accompany you and be prepared to drive you home after your procedure. 5. Bring all your current medications with you. 6. An IV may be inserted and sedation may be given at the discretion of the physician. 7. A blood pressure cuff, EKG and other monitors will often be applied during the procedure.  Some patients may need to have extra oxygen administered for a short  period. 8. You will be asked to provide medical information, including your allergies and medications, prior to the procedure.  We must know immediately if you are taking blood thinners (like Coumadin/Warfarin) or if you are allergic to IV iodine contrast (dye).  We must know if you could possible be pregnant.  Possible side-effects:   Bleeding from needle site  Infection (rare, may require surgery)  Nerve injury (rare)  Numbness & tingling (temporary)  Difficulty urinating (rare, temporary)  Spinal headache (a headache worse with upright posture)  Light-headedness (temporary)  Pain at injection site (serveral days)  Decreased blood pressure (rare, temporary)  Weakness in arm/leg (temporary)  Pressure sensation in back/neck (temporary)   Call if you experience:   Fever/chills associated with headache or increased back/neck pain  Headache worsened by an upright position  New onset, weakness or numbness of an extremity below the injection site  Hives or difficulty breathing (go to the emergency room)  Inflammation or drainage at the injection site(s)  Severe back/neck pain greater than usual  New symptoms which are concerning to you  Please note:  Although the local anesthetic injected can often make your back or neck feel good for several hours after the injection, the pain will likely return. It takes 3-7 days for steroids to work.  You may not notice any pain relief for at least one week.  If effective, we will often do a series of 2-3 injections spaced 3-6 weeks apart to maximally decrease your pain.  After the initial   series, you may be a candidate for a more permanent nerve block of the facets.  If you have any questions, please call #[phone]) [facility] were given 3 prescriptions for Levorphanol today.

## 2017-08-04 NOTE — Progress Notes (Signed)
Nursing Pain Medication Assessment:  Safety precautions to be maintained throughout the outpatient stay will include: orient to surroundings, keep bed in low position, maintain call bell within reach at all times, provide assistance with transfer out of bed and ambulation.  Medication Inspection Compliance: Pill count conducted under aseptic conditions, in front of the patient. Neither the pills nor the bottle was removed from the patient's sight at any time. Once count was completed pills were immediately returned to the patient in their original bottle.  Medication: levorphanol Pill/Patch Count: 30 of 105 pills remain Pill/Patch Appearance: Markings consistent with prescribed medication Bottle Appearance: Standard pharmacy container. Clearly labeled. Filled Date: 03 / 15 / 2018 Last Medication intake:  Today

## 2017-08-04 NOTE — Progress Notes (Signed)
Patient's Name: Connie [name]  MRN: [id number]  Referring Provider: Tracie Harrier, MD  DOB: [birth year]  PCP: Tracie Harrier, MD  DOS: 08/04/2017  Note by: [name], MD  Service setting: Ambulatory outpatient  Specialty: Interventional Pain Management  Location: [facility] (AMB) Pain Management Facility    Patient type: Established   Primary Reason(s) for Visit: Encounter for prescription drug management. (Level of risk: moderate)  CC: Back Pain (no pain today) and Neck Pain (L>R)  HPI  Connie [name] is a 62 y.o. year old, female patient, who comes today for a medication management evaluation. She has Degenerative joint disease of cervical and lumbar spine; Lumbar spondylosis; History of lumbar fusion; Lumbar degenerative disc disease; Fibromyalgia; Cervicalgia; DDD (degenerative disc disease), cervical; Chronic pain syndrome; Major depressive disorder; Spinal cord stimulator status; Chiari malformation type I ([place]); Adiposis dolorosa; Acquired spondylolisthesis; Chronic constipation; Chronic midline low back pain; Chronic pain; Daytime somnolence; Delusions of parasitosis ([place]); Diastolic dysfunction; Dry eyes, bilateral; Dysphagia, oropharyngeal phase; Edema; GERD without esophagitis; Generalized osteoarthritis of multiple sites; Fluid collection at surgical site; Fatty liver; Fatigue; Essential hypertension; Internal hemorrhoid; Insomnia; Hypothyroidism; H/O gastric bypass; Seasonal allergies; Rotator cuff arthropathy of both shoulders; Osteoporosis; Obesity, morbid ([place]); Nonunion of fracture; Morgellons disease; Medication-induced delirium, acute, hyperactive ([place]); Lumbar pseudoarthrosis; Leg cramps; Iron deficiency; Joint pain; Spinal stenosis of lumbar region without neurogenic claudication; Arthritis; Complete tear of right rotator cuff; OA (osteoarthritis); Rotator cuff tendinitis, right; Lumbar post-laminectomy syndrome; Lumbar radiculopathy; Cervical radiculopathy; Bilateral hand pain;  Candidiasis of skin and nails; CRP elevated; Jaw pain, non-TMJ; Lumbosacral radiculitis; Lumbosacral spondylosis without myelopathy; Pain in the coccyx; Menopausal symptom; Temporal headache; Stiffness of shoulder joint; SOBOE (shortness of breath on exertion); and Screening for osteoporosis on their problem list. Her primarily concern today is the Back Pain (no pain today) and Neck Pain (L>R)  Pain Assessment: Location: Lower Back Radiating:  yes radiates to left shoulder and left scapula Onset: More than a month ago Duration: Chronic pain Quality:  throbbing, aching Severity: 0-No pain/10 (self-reported pain score)  Note: Reported level is compatible with observation.                         When using our objective Pain Scale, levels between 6 and 10/10 are said to belong in an emergency room, as it progressively worsens from a 6/10, described as severely limiting, requiring emergency care not usually available at an outpatient pain management facility. At a 6/10 level, communication becomes difficult and requires great effort. Assistance to reach the emergency department may be required. Facial flushing and profuse sweating along with potentially dangerous increases in heart rate and blood pressure will be evident. Effect on ADL:   Timing: Intermittent Modifying factors:    Connie [name] was last scheduled for an appointment on 07/21/2017 for medication management. During today's appointment we reviewed Connie [name]'s chronic pain status, as well as her outpatient medication regimen.  The patient  reports that she does not use drugs. Her body mass index is 38.55 kg/m.  Further details on both, my assessment(s), as well as the proposed treatment plan, please see below.  Controlled Substance Pharmacotherapy Assessment REMS (Risk Evaluation and Mitigation Strategy)  Analgesic:Levorphanol 3  Mg, 65m  2 mg, daily, quantity 105, MME equals 77 MME/day: 77 mg/day.   TDewayne Shorter RN  08/04/2017 12:19  PM  Signed Nursing Pain Medication Assessment:  Safety precautions to be maintained throughout the outpatient stay will include: orient to  surroundings, keep bed in low position, maintain call bell within reach at all times, provide assistance with transfer out of bed and ambulation.  Medication Inspection Compliance: Pill count conducted under aseptic conditions, in front of the patient. Neither the pills nor the bottle was removed from the patient's sight at any time. Once count was completed pills were immediately returned to the patient in their original bottle.  Medication: levorphanol Pill/Patch Count: 30 of 105 pills remain Pill/Patch Appearance: Markings consistent with prescribed medication Bottle Appearance: Standard pharmacy container. Clearly labeled. Filled Date: 03 / 15 / 2018 Last Medication intake:  Today   Pharmacokinetics: Liberation and absorption (onset of action): WNL Distribution (time to peak effect): WNL Metabolism and excretion (duration of action): WNL         Pharmacodynamics: Desired effects: Analgesia: Connie [name] reports >50% benefit. Functional ability: Patient reports that medication allows her to accomplish basic ADLs Clinically meaningful improvement in function (CMIF): Sustained CMIF goals met Perceived effectiveness: Described as relatively effective, allowing for increase in activities of daily living (ADL) Undesirable effects: Side-effects or Adverse reactions: None reported Monitoring: [place] PMP: Online review of the past 56-monthperiod conducted. Compliant with practice rules and regulations Last UDS on record: Summary  Date Value Ref Range Status  06/04/2017 FINAL  Final    Comment:    ==================================================================== TOXASSURE SELECT 13 (MW) ==================================================================== Specimen Alert Note:  Urinary creatinine is low; ability to detect some drugs may be compromised.   Interpret results with caution. ==================================================================== Test                             Result       Flag       Units   NO DRUGS DETECTED. ==================================================================== Test                      Result    Flag   Units      Ref Range   Creatinine              16        L      mg/dL      >=20 ==================================================================== Declared Medications:  The flagging and interpretation on this report are based on the  following declared medications.  Unexpected results may arise from  inaccuracies in the declared medications.  **Note: The testing scope of this panel does not include following  reported medications:  Bupropion  Celecoxib  Diphenhydramine (Benadryl)  Fluticasone (Flonase)  Furosemide (Lasix)  Gabapentin  Glycopyrrolate  Ibuprofen  Levorphanol  Loratadine  Metoprolol  Ondansetron  Pantoprazole ==================================================================== For clinical consultation, please call [phone] ====================================================================    UDS interpretation: Compliant          Medication Assessment Form: Reviewed. Patient indicates being compliant with therapy Treatment compliance: Compliant Risk Assessment Profile: Aberrant behavior: See prior evaluations. None observed or detected today Comorbid factors increasing risk of overdose: See prior notes. No additional risks detected today Risk of substance use disorder (SUD): Low  ORT Scoring interpretation table:  Score <3 = Low Risk for SUD  Score between 4-7 = Moderate Risk for SUD  Score >8 = High Risk for Opioid Abuse   Risk Mitigation Strategies:  Patient Counseling: Covered Patient-Prescriber Agreement (PPA): Present and active  Notification to other healthcare providers: Done  Pharmacologic Plan: No change in therapy, at this time.  Laboratory Chemistry  Inflammation Markers (CRP: Acute Phase) (ESR: Chronic Phase) No results found for: CRP, ESRSEDRATE, LATICACIDVEN                       Rheumatology Markers No results found for: RF, ANA, [facility]                      Renal Function Markers Lab Results  Component Value Date   BUN 18 06/23/2017   CREATININE 0.60 06/23/2017   GFRAA >60 06/23/2017   GFRNONAA >60 06/23/2017                              Hepatic Function Markers Lab Results  Component Value Date   AST 20 06/23/2017   ALT 9 (L) 06/23/2017   ALBUMIN 3.6 06/23/2017   ALKPHOS 88 06/23/2017                        Electrolytes Lab Results  Component Value Date   NA 135 06/23/2017   K 4.9 06/23/2017   CL 102 06/23/2017   CALCIUM 8.5 (L) 06/23/2017                        Neuropathy Markers No results found for: VITAMINB12, FOLATE, HGBA1C, HIV                      Bone Pathology Markers No results found for: VD25OH, VD125OH2TOT, ZO1096EA5, WU9811BJ4, 25OHVITD1, 25OHVITD2, 25OHVITD3, TESTOFREE, TESTOSTERONE                       Coagulation Parameters Lab Results  Component Value Date   PLT 211 06/23/2017                        Cardiovascular Markers Lab Results  Component Value Date   HGB 11.8 (L) 06/23/2017   HCT 36.1 06/23/2017                         CA Markers No results found for: CEA, CA125, LABCA2                      Note: Lab results reviewed.  Recent Diagnostic Imaging Results  CT LUMBAR SPINE W CONTRAST CLINICAL DATA:  Low back pain extending into the right lower extremity.  EXAM: CT MYELOGRAPHY LUMBAR SPINE  TECHNIQUE: CT imaging of the lumbar spine was performed after Isovue 300M contrast administration. Multiplanar CT image reconstructions were also generated.  COMPARISON:  CT of the abdomen and pelvis 05/12/2017.  FINDINGS: The subarachnoid injection was performed by duct with her of and is reported  separately.  Segmentation: A transitional L5 segment is noted. The lowest fully formed vertebral body is labeled L5.  Alignment: Grade 3 anterolisthesis at L4-5 measures 17 mm. Pedicle screw and rod fixation is present posteriorly at L3-4 and L4-5. Slight retrolisthesis is present at L1-2 and L2-3. There is slight retrolisthesis at T11-12.  Vertebrae: Chronic endplate marrow changes are present at L5-S1 and to lesser extent at L4-5.  Conus medullaris: Extends to the L1-2 level and appears normal.  Paraspinal and other soft tissues: Limited imaging of the abdomen is unremarkable. Lung bases are clear.  Disc levels:  A spinal cord stimulator  enters at T8-9. A prominent right paramedian disc protrusion effaces the ventral CSF and slightly distorts the spinal cord at T9-10. Mild right foraminal narrowing is also present.  There is uncovering of a broad-based disc protrusion and T11-12. No significant stenosis is present.  T12-L1: Negative.  L1-2: Mild facet hypertrophy and slight retrolisthesis is present. The central canal is patent. Moderate foraminal stenosis is worse on the right.  L2-3: Mild broad-based disc protrusion is present. Moderate facet hypertrophy is noted bilaterally. Moderate foraminal narrowing bilaterally is worse on the right.  L3-4: Wide laminectomy decompresses the central canal. Foramina are patent. Posterior osseous fusion is intact.  L4-5: Laminectomy decompresses the central canal. Foramina are patent bilaterally. There is still some impact on the ventral nerve roots, right greater than left.  L5-S1: Disc material and endplate degenerative changes contribute to moderate foraminal stenosis bilaterally, right greater than left.  IMPRESSION: 1. Spinal cord stimulator at T8-9. 2. Transitional S1 anatomy. 3. Prominent right paramedian disc protrusion at T9-10. 4. Moderate foraminal stenosis at L1-2 and L2-3 is worse on the right. 5. Adequate  decompression of the central canal at L3-4 and L4-5. 6. Despite posterior decompression of the spinal canal at L4-5. Grade 3 anterolisthesis results in some impact on the ventral nerve roots, right greater than left. 7. The moderate foraminal stenosis bilaterally at L5-S1 is worse on the right.  Electronically Signed   By: San Morelle M.D.   On: 07/24/2017 14:34 CT CERVICAL SPINE W CONTRAST CLINICAL DATA:  Chronic neck pain.  Cervical myelopathy.  EXAM: CT MYELOGRAPHY CERVICAL SPINE  TECHNIQUE: CT imaging of the cervical spine was performed after Isovue 300M contrast administration. Multiplanar CT image reconstructions were also generated.  COMPARISON:  None.  FINDINGS: CSF opacification is excellent.  Alignment: Slight anterolisthesis present at C3-4, C4-5, and C6-7. There is straightening of the normal cervical lordosis.  Vertebrae: Chronic endplate sclerotic changes are present at C5-6 and C6-7. Vertebral body heights are maintained. No focal lytic or blastic lesions are present.  Cord: Court size and shape is normal throughout.  Posterior Fossa and paraspinal tissues: The cerebellar tonsils extend just below the foramen magnum on midline cuts. There is further extension on the right without a Chiari malformation. Intracranial structures are otherwise within normal limits.  Disc levels:  C2-3: Negative.  C3-4: Negative.  C4-5: There is slight uncovering of a disc bulge. No significant stenosis is present.  C5-6: Asymmetric uncovertebral spurring is worse on the left. There is partial effacement of the ventral CSF. There is no significant distortion of the cord. Moderate left mild right foraminal stenosis is present.  C6-7: A broad-based disc osteophyte complex is present. Asymmetric left-sided facet hypertrophy is noted. There is partial effacement of the ventral CSF. Mild foraminal narrowing is present bilaterally.  C7-T1:  Negative.  IMPRESSION: 1. Leftward disc osteophyte complex with mild central canal stenosis at C5-6. 2. Moderate left and mild right foraminal narrowing at C5-6. 3. Mild central and bilateral foraminal narrowing at C6-7 due to a broad-based disc osteophyte complex and bilateral uncovertebral spurring. Facet hypertrophy is worse on the left. 4. Slight uncovering of the disc bulge at C4-5 without significant focal stenosis.  Electronically Signed   By: San Morelle M.D.   On: 07/24/2017 14:24 DG Myelogram Lumbar Addendum: ADDENDUM REPORT: 07/24/2017 12:10   FLUOROSCOPY TIME:  4 minutes 48 seconds; 16 acquired images.   Electronically Signed    By: Lowella Grip III M.D.    On: 07/24/2017 12:10  Narrative: CLINICAL DATA:  Chronic back and neck pain.  Previous fusion  FLUOROSCOPY TIME:  dictate in minutes and seconds  PROCEDURE: LUMBAR PUNCTURE FOR LUMBAR AND CERVICAL MYELOGRAM  After thorough discussion of risks and benefits of the procedure including bleeding, infection, injury to nerves, blood vessels, adjacent structures as well as headache and CSF leak, written and oral informed consent was obtained. Consent was obtained by Dr. Lowella Grip III. We discussed the high likelihood of obtaining a diagnostic study.  Patient was positioned prone on the fluoroscopy table. Local anesthesia was provided with 1% lidocaine without epinephrine after prepped and draped in the usual sterile fashion. Puncture was performed at L2-3 using a 6 inch 20-gauge spinal needle via a right paramedian) approach. Using a single pass through the dura, the needle was placed within the thecal sac, with return of clear CSF. Thirteen mL of Isovue M-300 was injected into the thecal sac, with normal opacification of the nerve roots and cauda equina consistent with free flow within the subarachnoid space. Images of the lumbar region were obtained for lumbar myelogram. The patient was  then moved to the trendelenburg position and contrast flowed into the Cervical spine region. The needle was removed after the myelographic procedure. The patient was transferred in stable condition to CT for CT lumbar and cervical postmyelogram images.  I personally performed the lumbar puncture and administered the intrathecal contrast. I also personally supervised acquisition of the myelogram images.  TECHNIQUE: Contiguous axial images were obtained through the Cervical spine after the intrathecal infusion of infusion. Coronal and sagittal reconstructions were obtained of the axial image sets.  FINDINGS: CERVICAL MYELOGRAM FINDINGS:  There is a central extradural defect at C5-6 with narrowing of the canal focally at this level. There is mild disc bulging centrally at C4-5. There is incomplete filling of the left C4-5 and C5-6 nerve roots. Other nerve roots elsewhere appear normal. No intradural or intramedullary lesion.  LUMBAR MYELOGRAM FINDINGS:  There is screw and plate fixation posteriorly at L4, L5, S1. There is 14 mm of anterolisthesis of L4 on L5. No other spondylolisthesis. There is narrowing of the thecal sac at L4-5 at the site of spondylolisthesis.  There is incomplete filling of nerve roots at L4-5 bilaterally, more severe on the left than on the right. Nerve roots elsewhere show essentially normal filling. No high-grade stenosis evident. No intradural lesions. Conus appears normal.  IMPRESSION: Cervical myelogram:  Focal central impression on the canal at C5-6 with mild spinal stenosis. Incomplete filling of nerve roots on the left at C4-5 and C5-6, likely due to of facet arthropathy. No well-defined disc extrusion. No higher grade stenosis. No intradural or intramedullary lesion. Postmyelogram CT to follow.  Lumbar myelogram: Spondylolisthesis at L4-5 with narrowing of the thecal sac and decreased filling of nerve roots, more severe on the left than on  the right, L4-5. Other disc levels appear essentially unremarkable. Conus appears normal. No intradural lesions. Postmyelogram CT to follow.  Electronically Signed: By: Lowella Grip III M.D. On: 07/24/2017 12:04 DG Myelogram Cervical Addendum: ADDENDUM REPORT: 07/24/2017 12:10   FLUOROSCOPY TIME:  4 minutes 48 seconds; 16 acquired images.   Electronically Signed    By: Lowella Grip III M.D.    On: 07/24/2017 12:10 Narrative: CLINICAL DATA:  Chronic back and neck pain.  Previous fusion  FLUOROSCOPY TIME:  dictate in minutes and seconds  PROCEDURE: LUMBAR PUNCTURE FOR LUMBAR AND CERVICAL MYELOGRAM  After thorough discussion of risks and benefits of the procedure  including bleeding, infection, injury to nerves, blood vessels, adjacent structures as well as headache and CSF leak, written and oral informed consent was obtained. Consent was obtained by Dr. Lowella Grip III. We discussed the high likelihood of obtaining a diagnostic study.  Patient was positioned prone on the fluoroscopy table. Local anesthesia was provided with 1% lidocaine without epinephrine after prepped and draped in the usual sterile fashion. Puncture was performed at L2-3 using a 6 inch 20-gauge spinal needle via a right paramedian) approach. Using a single pass through the dura, the needle was placed within the thecal sac, with return of clear CSF. Thirteen mL of Isovue M-300 was injected into the thecal sac, with normal opacification of the nerve roots and cauda equina consistent with free flow within the subarachnoid space. Images of the lumbar region were obtained for lumbar myelogram. The patient was then moved to the trendelenburg position and contrast flowed into the Cervical spine region. The needle was removed after the myelographic procedure. The patient was transferred in stable condition to CT for CT lumbar and cervical postmyelogram images.  I personally performed the lumbar  puncture and administered the intrathecal contrast. I also personally supervised acquisition of the myelogram images.  TECHNIQUE: Contiguous axial images were obtained through the Cervical spine after the intrathecal infusion of infusion. Coronal and sagittal reconstructions were obtained of the axial image sets.  FINDINGS: CERVICAL MYELOGRAM FINDINGS:  There is a central extradural defect at C5-6 with narrowing of the canal focally at this level. There is mild disc bulging centrally at C4-5. There is incomplete filling of the left C4-5 and C5-6 nerve roots. Other nerve roots elsewhere appear normal. No intradural or intramedullary lesion.  LUMBAR MYELOGRAM FINDINGS:  There is screw and plate fixation posteriorly at L4, L5, S1. There is 14 mm of anterolisthesis of L4 on L5. No other spondylolisthesis. There is narrowing of the thecal sac at L4-5 at the site of spondylolisthesis.  There is incomplete filling of nerve roots at L4-5 bilaterally, more severe on the left than on the right. Nerve roots elsewhere show essentially normal filling. No high-grade stenosis evident. No intradural lesions. Conus appears normal.  IMPRESSION: Cervical myelogram:  Focal central impression on the canal at C5-6 with mild spinal stenosis. Incomplete filling of nerve roots on the left at C4-5 and C5-6, likely due to of facet arthropathy. No well-defined disc extrusion. No higher grade stenosis. No intradural or intramedullary lesion. Postmyelogram CT to follow.  Lumbar myelogram: Spondylolisthesis at L4-5 with narrowing of the thecal sac and decreased filling of nerve roots, more severe on the left than on the right, L4-5. Other disc levels appear essentially unremarkable. Conus appears normal. No intradural lesions. Postmyelogram CT to follow.  Electronically Signed: By: Lowella Grip III M.D. On: 07/24/2017 12:04  Complexity Note: Imaging results reviewed. Results shared with Ms.  [name], using Layman's terms. Today I personally and independently reviewed the study images pertinent to Ms. Stemm's problem.       Images reviewed using the PACS system hyperlink. I have personally examined the images and I agree with the reported  findings. I find no additional pain-related pathology to add to the report.  Meds   Current Outpatient Medications:  .  buPROPion (WELLBUTRIN XL) 300 MG 24 hr tablet, Take 1 tablet (300 mg total) by mouth daily. (Patient taking differently: Take 300 mg by mouth daily. In the morning), Disp: 90 tablet, Rfl: 0 .  diphenhydrAMINE (BENADRYL) 25 MG tablet, Take 50 mg by  mouth daily as needed for allergies., Disp: , Rfl:  .  furosemide (LASIX) 40 MG tablet, Take 40 mg by mouth daily as needed for fluid., Disp: , Rfl:  .  gabapentin (NEURONTIN) 300 MG capsule, Take 1 capsule (300 mg total) by mouth 2 (two) times daily., Disp: 60 capsule, Rfl: 2 .  glycopyrrolate (ROBINUL) 1 MG tablet, Take 1 mg by mouth 2 (two) times daily. , Disp: , Rfl: 3 .  ibuprofen (ADVIL,MOTRIN) 200 MG tablet, Take 400 mg by mouth daily as needed for headache or moderate pain., Disp: , Rfl:  .  levorphanol (LEVODROMORAN) 2 MG tablet, 3 mg in am, 2 mg in afternoon, and 30m evening For chronic pain To last 30 days from fill date Fill on or after:08/11/17, 09/09/17, 10/09/17, Disp: 105 tablet, Rfl: 0 .  levothyroxine (SYNTHROID, LEVOTHROID) 25 MCG tablet, Take 25 mcg by mouth once daily before breakfast, Disp: , Rfl: 0 .  lipase/protease/amylase (CREON) 36000 UNITS CPEP capsule, Take 36,000 Units by mouth 3 (three) times daily with meals., Disp: , Rfl:  .  metoprolol succinate (TOPROL-XL) 100 MG 24 hr tablet, Take 100 mg by mouth once daily in the evening, Disp: , Rfl: 0 .  ondansetron (ZOFRAN-ODT) 8 MG disintegrating tablet, Take 8 mg by mouth daily as needed for nausea or vomiting., Disp: , Rfl:  .  pantoprazole (PROTONIX) 40 MG tablet, Take 40 mg by mouth once daily in the evening, Disp: ,  Rfl:  .  predniSONE (DELTASONE) 20 MG tablet, Take 3 tabs (60 mg ) in the morning , 30 days, Disp: , Rfl:  .  traZODone (DESYREL) 50 MG tablet, Take 0.5-1 tablets (25-50 mg total) by mouth at bedtime., Disp: 30 tablet, Rfl: 1 .  vitamin B-12 (CYANOCOBALAMIN) 1000 MCG tablet, Take 1,000 mcg by mouth daily., Disp: , Rfl:  .  loratadine (CLARITIN) 10 MG tablet, Take 10 mgs by mouth once daily in the evening, Disp: , Rfl:  .  metroNIDAZOLE (FLAGYL) 500 MG tablet, , Disp: , Rfl: 0 .  montelukast (SINGULAIR) 10 MG tablet, Take by mouth., Disp: , Rfl:   ROS  Constitutional: Denies any fever or chills Gastrointestinal: No reported hemesis, hematochezia, vomiting, or acute GI distress Musculoskeletal: Denies any acute onset joint swelling, redness, loss of ROM, or weakness Neurological: No reported episodes of acute onset apraxia, aphasia, dysarthria, agnosia, amnesia, paralysis, loss of coordination, or loss of consciousness  Allergies  Ms. Bloxom is allergic to other and tape.  PFSH  Drug: Connie [name] reports that she does not use drugs. Alcohol:  reports that she does not drink alcohol. Tobacco:  reports that she quit smoking about 10 years ago. Her smoking use included cigarettes. She smoked 0.50 packs per day. She has never used smokeless tobacco. Medical:  has a past medical history of Anemia, Anxiety, Arthritis, Cervical spondylosis with myelopathy (2018), Chronic pain (2019), DDD (degenerative disc disease), lumbar, Depression, Displacement of cervical intervertebral disc (2018), Fibromyalgia, GERD (gastroesophageal reflux disease), Heart murmur, Hypertension, Hypothyroidism, Lumbar post-laminectomy syndrome (2018), Lumbosacral radiculitis (2018), Osteoporosis, Other long term (current) drug therapy (2019), Pain in the coccyx (2018), Spondylolisthesis (2018), Spondylosis of lumbosacral region without myelopathy or radiculopathy (2018), and Spondylosis with myelopathy, lumbar region  (2018). Surgical: Connie [name] has a past surgical history that includes Cesarean section; Gastric bypass (2004); Lumbar fusion (2016); Spinal cord stimulator implant (2018); Cholecystectomy; Appendectomy; Tonsillectomy; Shoulder arthroscopy with rotator cuff repair (Right, 2008); Joint replacement (Bilateral, 2005); Abdominal hysterectomy (2000); epidural injection (  2018); and arm surgery (2002). Family: family history includes Anxiety disorder in her mother; Cancer in her brother, father, and mother; Depression in her mother; Hypertension in her father; Kidney disease in her mother.  Constitutional Exam  General appearance: Well nourished, well developed, and well hydrated. In no apparent acute distress Vitals:   08/04/17 1210 08/04/17 1211  BP:  (!) 141/85  Pulse:  65  Resp:  18  Temp:  99 F (37.2 C)  SpO2:  98%  Weight: 204 lb (92.5 kg)   Height: '5\' 1"'  (1.549 m)    BMI Assessment: Estimated body mass index is 38.55 kg/m as calculated from the following:   Height as of this encounter: '5\' 1"'  (1.549 m).   Weight as of this encounter: 204 lb (92.5 kg).  BMI interpretation table: BMI level Category Range association with higher incidence of chronic pain  <18 kg/m2 Underweight   18.5-24.9 kg/m2 Ideal body weight   25-29.9 kg/m2 Overweight Increased incidence by 20%  30-34.9 kg/m2 Obese (Class I) Increased incidence by 68%  35-39.9 kg/m2 Severe obesity (Class II) Increased incidence by 136%  >40 kg/m2 Extreme obesity (Class III) Increased incidence by 254%   BMI Readings from Last 4 Encounters:  08/04/17 38.55 kg/m  06/23/17 38.73 kg/m  06/04/17 38.73 kg/m  06/02/17 38.73 kg/m   Wt Readings from Last 4 Encounters:  08/04/17 204 lb (92.5 kg)  06/23/17 205 lb (93 kg)  06/04/17 205 lb (93 kg)  06/02/17 205 lb (93 kg)  Psych/Mental status: Alert, oriented x 3 (person, place, & time)       Eyes: PERLA Respiratory: No evidence of acute respiratory distress  Cervical Spine  Area Exam  Skin & Axial Inspection: No masses, redness, edema, swelling, or associated skin lesions Alignment: Symmetrical Functional ROM: Decreased ROM      Stability: No instability detected Muscle Tone/Strength: Functionally intact. No obvious neuro-muscular anomalies detected. Sensory (Neurological): Musculoskeletal pain pattern Palpation: Complains of area being tender to palpation Positive provocative maneuver for for cervical facet disease left greater than right  Upper Extremity (UE) Exam    Side: Right upper extremity  Side: Left upper extremity  Skin & Extremity Inspection: Skin color, temperature, and hair growth are WNL. No peripheral edema or cyanosis. No masses, redness, swelling, asymmetry, or associated skin lesions. No contractures.  Skin & Extremity Inspection: Skin color, temperature, and hair growth are WNL. No peripheral edema or cyanosis. No masses, redness, swelling, asymmetry, or associated skin lesions. No contractures.  Functional ROM: Unrestricted ROM          Functional ROM: Unrestricted ROM          Muscle Tone/Strength: Functionally intact. No obvious neuro-muscular anomalies detected.  Muscle Tone/Strength: Functionally intact. No obvious neuro-muscular anomalies detected.  Sensory (Neurological): Unimpaired          Sensory (Neurological): Unimpaired          Palpation: No palpable anomalies              Palpation: No palpable anomalies              Specialized Test(s): Deferred         Specialized Test(s): Deferred          Thoracic Spine Area Exam  Skin & Axial Inspection: No masses, redness, or swelling Alignment: Symmetrical Functional ROM: Unrestricted ROM Stability: No instability detected Muscle Tone/Strength: Functionally intact. No obvious neuro-muscular anomalies detected. Sensory (Neurological): Unimpaired Muscle strength & Tone: No palpable anomalies  Lumbar Spine Area Exam  Skin & Axial Inspection: Well healed scar from previous spine surgery  detected Alignment: Scoliosis detected Functional ROM: Diminished ROM      Stability: No instability detected Muscle Tone/Strength: Functionally intact. No obvious neuro-muscular anomalies detected. Sensory (Neurological): Dermatomal pain pattern and musculoskeletal Palpation: Complains of area being tender to palpation       Provocative Tests: Lumbar Hyperextension and rotation test: Positive bilaterally for facet joint pain. Lumbar Lateral bending test: evaluation deferred today       Patrick's Maneuver: evaluation deferred today                    Gait & Posture Assessment  Ambulation: Unassisted Gait: Relatively normal for age and body habitus Posture: WNL   Lower Extremity Exam    Side: Right lower extremity  Side: Left lower extremity  Skin & Extremity Inspection: Skin color, temperature, and hair growth are WNL. No peripheral edema or cyanosis. No masses, redness, swelling, asymmetry, or associated skin lesions. No contractures.  Skin & Extremity Inspection: Skin color, temperature, and hair growth are WNL. No peripheral edema or cyanosis. No masses, redness, swelling, asymmetry, or associated skin lesions. No contractures.  Functional ROM: Unrestricted ROM          Functional ROM: Unrestricted ROM          Muscle Tone/Strength: Functionally intact. No obvious neuro-muscular anomalies detected.  Muscle Tone/Strength: Functionally intact. No obvious neuro-muscular anomalies detected.  Sensory (Neurological): Unimpaired  Sensory (Neurological): Unimpaired  Palpation: No palpable anomalies  Palpation: No palpable anomalies   Assessment  Primary Diagnosis & Pertinent Problem List: The primary encounter diagnosis was Chronic pain syndrome. Diagnoses of Spondylosis of cervical region without myelopathy or radiculopathy, Cervical facet joint syndrome (L>R, C4/5/6/7), History of lumbar fusion, Lumbar degenerative disc disease, Lumbar spondylosis, Fibromyalgia, Cervicalgia, DDD (degenerative  disc disease), cervical, Major depressive disorder, remission status unspecified, unspecified whether recurrent, and Spinal cord stimulator status were also pertinent to this visit.  Status Diagnosis  Persistent Persistent Persistent 1. Chronic pain syndrome   2. Spondylosis of cervical region without myelopathy or radiculopathy   3. Cervical facet joint syndrome (L>R, C4/5/6/7)   4. History of lumbar fusion   5. Lumbar degenerative disc disease   6. Lumbar spondylosis   7. Fibromyalgia   8. Cervicalgia   9. DDD (degenerative disc disease), cervical   10. Major depressive disorder, remission status unspecified, unspecified whether recurrent   11. Spinal cord stimulator status      General Recommendations: The pain condition that the patient suffers from is best treated with a multidisciplinary approach that involves an increase in physical activity to prevent de-conditioning and worsening of the pain cycle, as well as psychological counseling (formal and/or informal) to address the co-morbid psychological affects of pain. Treatment will often involve judicious use of pain medications and interventional procedures to decrease the pain, allowing the patient to participate in the physical activity that will ultimately produce long-lasting pain reductions. The goal of the multidisciplinary approach is to return the patient to a higher level of overall function and to restore their ability to perform activities of daily living.  62 year old female with chronic pain syndrome secondary to lumbar degenerative disc disease, lumbar radiculopathy, lumbar spondylosis status post L3-L5 lumbar fusion who presents with worsening neck pain that radiates into bilateral shoulders and bilateral upper extremities. Upon chart review from neurosurgery note, patient's MRI from 2016 showed mild compression at C5-C6 and stenosis at C6/7.  Patient's  CT myelogram was reviewed with her today which reveals left disc  osteophyte complex with mild central canal stenosis at C5-6, facet hypertrophy and cervical spondylosis at C4/5/6/7, left greater than right.  Patient does have pain consistent with cervical facet syndrome which is worse on the left.  Patient endorses pain in her left neck, shoulder, periscapular region which is worse with cervical extension and lateral rotation.  In regards to this, we discussed cervical facet medial branch nerve blocks, diagnostic.  Patient states that she has had these before on her lumbar spine which were somewhat effective.  Risks and benefits of this procedure were discussed.  Patient is not on any blood thinners.  Patient would like to proceed with bilateral C4, C5, C6, C7 cervical facet medial branch nerve block with possible radiofrequency ablation thereafter depending upon results from diagnostic injection.  Regards to medication management, we will refill the patient's levorphanol dose as below.  No change in therapy.  Plan: -Refill levorphanol as below, prescription provided for 3 months -Scheduled for bilateral C4, C5, C6, C7 cervical facet medial branch nerve blocks #1 with sedation -Continue depression management with psychiatry  Plan of Care  Pharmacotherapy (Medications Ordered): Meds ordered this encounter  Medications  . DISCONTD: levorphanol (LEVODROMORAN) 2 MG tablet    Sig: 3 mg in am, 2 mg in afternoon, and 63m evening For chronic pain To last 30 days from fill date Fill on or after:08/11/17, 09/09/17, 10/09/17    Dispense:  105 tablet    Refill:  0  . DISCONTD: levorphanol (LEVODROMORAN) 2 MG tablet    Sig: 3 mg in am, 2 mg in afternoon, and 2104mevening For chronic pain To last 30 days from fill date Fill on or after:08/11/17, 09/09/17, 10/09/17    Dispense:  105 tablet    Refill:  0  . levorphanol (LEVODROMORAN) 2 MG tablet    Sig: 3 mg in am, 2 mg in afternoon, and 64m9mvening For chronic pain To last 30 days from fill date Fill on or after:08/11/17,  09/09/17, 10/09/17    Dispense:  105 tablet    Refill:  0   Lab-work, procedure(s), and/or referral(s): Orders Placed This Encounter  Procedures  . CERVICAL FACET (MEDIAL BRANCH NERVE BLOCK)     Provider-requested follow-up: Return in about 2 weeks (around 08/18/2017) for Procedure. Time Note: Greater than 50% of the 25 minute(s) of face-to-face time spent with Connie [name], was spent in counseling/coordination of care regarding: Ms. LowRaineriimary cause of pain, the results of her recent test(s), the significance of each one oth the test(s) anomalies and it's corresponding characteristic pain pattern(s), the treatment plan, treatment alternatives, the risks and possible complications of proposed treatment, going over the informed consent, the opioid analgesic risks and possible complications, the appropriate use of her medications, realistic expectations, the goals of pain management (increased in functionality), the medication agreement and the patient's responsibilities when it comes to controlled substances. Future Appointments  Date Time Provider [place]/02/2018 10:30 AM EapUrsula AlertD [facility] None  08/06/2017 11:00 AM PeaLubertha SouthCSW [facility] None  08/10/2017  3:00 PM [facility]/12/2017 12:00 PM LatGillis SantaD [facility] None    Primary Care Physician: Connie HarrierD [facility] Note by: BilGillis Santa.D Date: 08/04/2017; Time: 2:23 PM  Patient Instructions  Facet Blocks Patient Information  Description: The facets are joints in the spine between the vertebrae.  Like any joints in  the body, facets can become irritated and painful.  Arthritis can also effect the facets.  By injecting steroids and local anesthetic in and around these joints, we can temporarily block the nerve supply to them.  Steroids act directly on irritated nerves and tissues to reduce selling and inflammation which often leads to  decreased pain.  Facet blocks may be done anywhere along the spine from the neck to the low back depending upon the location of your pain.   After numbing the skin with local anesthetic (like Novocaine), a small needle is passed onto the facet joints under x-ray guidance.  You may experience a sensation of pressure while this is being done.  The entire block usually lasts about 15-25 minutes.   Conditions which may be treated by facet blocks:   Low back/buttock pain  Neck/shoulder pain  Certain types of headaches  Preparation for the injection:  1. Do not eat any solid food or dairy products within 8 hours of your appointment. 2. You may drink clear liquid up to 3 hours before appointment.  Clear liquids include water, black coffee, juice or soda.  No milk or cream please. 3. You may take your regular medication, including pain medications, with a sip of water before your appointment.  Diabetics should hold regular insulin (if taken separately) and take 1/2 normal NPH dose the morning of the procedure.  Carry some sugar containing items with you to your appointment. 4. A driver must accompany you and be prepared to drive you home after your procedure. 5. Bring all your current medications with you. 6. An IV may be inserted and sedation may be given at the discretion of the physician. 7. A blood pressure cuff, EKG and other monitors will often be applied during the procedure.  Some patients may need to have extra oxygen administered for a short period. 8. You will be asked to provide medical information, including your allergies and medications, prior to the procedure.  We must know immediately if you are taking blood thinners (like Coumadin/Warfarin) or if you are allergic to IV iodine contrast (dye).  We must know if you could possible be pregnant.  Possible side-effects:   Bleeding from needle site  Infection (rare, may require surgery)  Nerve injury (rare)  Numbness & tingling  (temporary)  Difficulty urinating (rare, temporary)  Spinal headache (a headache worse with upright posture)  Light-headedness (temporary)  Pain at injection site (serveral days)  Decreased blood pressure (rare, temporary)  Weakness in arm/leg (temporary)  Pressure sensation in back/neck (temporary)   Call if you experience:   Fever/chills associated with headache or increased back/neck pain  Headache worsened by an upright position  New onset, weakness or numbness of an extremity below the injection site  Hives or difficulty breathing (go to the emergency room)  Inflammation or drainage at the injection site(s)  Severe back/neck pain greater than usual  New symptoms which are concerning to you  Please note:  Although the local anesthetic injected can often make your back or neck feel good for several hours after the injection, the pain will likely return. It takes 3-7 days for steroids to work.  You may not notice any pain relief for at least one week.  If effective, we will often do a series of 2-3 injections spaced 3-6 weeks apart to maximally decrease your pain.  After the initial series, you may be a candidate for a more permanent nerve block of the facets.  If you have any  questions, please call #[phone]) [place] were given 3 prescriptions for Levorphanol today.

## 2017-08-05 ENCOUNTER — Encounter: Payer: Self-pay | Admitting: Student in an Organized Health Care Education/Training Program

## 2017-08-05 MED ORDER — GABAPENTIN 300 MG PO CAPS: 300.0000 mg | ORAL_CAPSULE | Freq: Two times a day (BID) | ORAL | 2 refills | Status: DC

## 2017-08-05 NOTE — Telephone Encounter (Signed)
Gabapentin Rx sent in.   Requested Prescriptions   Signed Prescriptions Disp Refills  . gabapentin (NEURONTIN) 300 MG capsule 60 capsule 2    Sig: Take 1 capsule (300 mg total) by mouth 2 (two) times daily.    Authorizing Provider: [name]

## 2017-08-06 ENCOUNTER — Ambulatory Visit (INDEPENDENT_AMBULATORY_CARE_PROVIDER_SITE_OTHER): Payer: 59 | Admitting: Licensed Clinical Social Worker

## 2017-08-06 ENCOUNTER — Other Ambulatory Visit: Payer: Self-pay

## 2017-08-06 ENCOUNTER — Encounter: Payer: Self-pay | Admitting: Psychiatry

## 2017-08-06 ENCOUNTER — Ambulatory Visit (INDEPENDENT_AMBULATORY_CARE_PROVIDER_SITE_OTHER): Payer: 59 | Admitting: Psychiatry

## 2017-08-06 VITALS — BP 136/79 | HR 65 | Temp 98.5°F | Wt 206.0 lb

## 2017-08-06 DIAGNOSIS — F5105 Insomnia due to other mental disorder: Secondary | ICD-10-CM | POA: Diagnosis not present

## 2017-08-06 DIAGNOSIS — F331 Major depressive disorder, recurrent, moderate: Secondary | ICD-10-CM

## 2017-08-06 MED ORDER — HYDROXYZINE HCL 10 MG PO TABS: 10.0000 mg | ORAL_TABLET | Freq: Every evening | ORAL | 1 refills | Status: DC | PRN

## 2017-08-06 MED ORDER — TRAZODONE HCL 100 MG PO TABS: 100.0000 mg | ORAL_TABLET | Freq: Every evening | ORAL | 1 refills | Status: DC | PRN

## 2017-08-06 MED ORDER — BUPROPION HCL ER (XL) 300 MG PO TB24: 300.0000 mg | ORAL_TABLET | Freq: Every day | ORAL | 1 refills | Status: DC

## 2017-08-06 NOTE — Patient Instructions (Signed)
Hydroxyzine capsules or tablets What is this medicine? HYDROXYZINE (hye [place] i zeen) is an antihistamine. This medicine is used to treat allergy symptoms. It is also used to treat anxiety and tension. This medicine can be used with other medicines to induce sleep before surgery. This medicine may be used for other purposes; ask your health care provider or pharmacist if you have questions. COMMON BRAND NAME(S): ANX, Atarax, Rezine, Vistaril What should I tell my health care provider before I take this medicine? They need to know if you have any of these conditions: -any chronic illness -difficulty passing urine -glaucoma -heart disease -kidney disease -liver disease -lung disease -an unusual or allergic reaction to hydroxyzine, cetirizine, other medicines, foods, dyes, or preservatives -pregnant or trying to get pregnant -breast-feeding How should I use this medicine? Take this medicine by mouth with a full glass of water. Follow the directions on the prescription label. You may take this medicine with food or on an empty stomach. Take your medicine at regular intervals. Do not take your medicine more often than directed. Talk to your pediatrician regarding the use of this medicine in children. Special care may be needed. While this drug may be prescribed for children as young as 75 years of age for selected conditions, precautions do apply. Patients over 62 years old may have a stronger reaction and need a smaller dose. Overdosage: If you think you have taken too much of this medicine contact a poison control center or emergency room at once. NOTE: This medicine is only for you. Do not share this medicine with others. What if I miss a dose? If you miss a dose, take it as soon as you can. If it is almost time for your next dose, take only that dose. Do not take double or extra doses. What may interact with this medicine? -alcohol -barbiturate medicines for sleep or seizures -medicines for  colds, allergies -medicines for depression, anxiety, or emotional disturbances -medicines for pain -medicines for sleep -muscle relaxants This list may not describe all possible interactions. Give your health care provider a list of all the medicines, herbs, non-prescription drugs, or dietary supplements you use. Also tell them if you smoke, drink alcohol, or use illegal drugs. Some items may interact with your medicine. What should I watch for while using this medicine? Tell your doctor or health care professional if your symptoms do not improve. You may get drowsy or dizzy. Do not drive, use machinery, or do anything that needs mental alertness until you know how this medicine affects you. Do not stand or sit up quickly, especially if you are an older patient. This reduces the risk of dizzy or fainting spells. Alcohol may interfere with the effect of this medicine. Avoid alcoholic drinks. Your mouth may get dry. Chewing sugarless gum or sucking hard candy, and drinking plenty of water may help. Contact your doctor if the problem does not go away or is severe. This medicine may cause dry eyes and blurred vision. If you wear contact lenses you may feel some discomfort. Lubricating drops may help. See your eye doctor if the problem does not go away or is severe. If you are receiving skin tests for allergies, tell your doctor you are using this medicine. What side effects may I notice from receiving this medicine? Side effects that you should report to your doctor or health care professional as soon as possible: -fast or irregular heartbeat -difficulty passing urine -seizures -slurred speech or confusion -tremor Side effects that  usually do not require medical attention (report to your doctor or health care professional if they continue or are bothersome): -constipation -drowsiness -fatigue -headache -stomach upset This list may not describe all possible side effects. Call your doctor for  medical advice about side effects. You may report side effects to FDA at 1-800-FDA-1088. Where should I keep my medicine? Keep out of the reach of children. Store at room temperature between 15 and 30 degrees C (59 and 86 degrees F). Keep container tightly closed. Throw away any unused medicine after the expiration date. NOTE: This sheet is a summary. It may not cover all possible information. If you have questions about this medicine, talk to your doctor, pharmacist, or health care provider.  2018 [employer]vier/Gold Standard (2007-08-27 14:50:59)  

## 2017-08-06 NOTE — Progress Notes (Signed)
[facility] MD OP Progress Note  08/06/2017 8:57 PM Connie [name]  MRN:  [id number]  Chief Complaint: ' I am not sleeping." Chief Complaint    Follow-up; Medication Refill     HPI: Connie [name] is a 62 year old Caucasian female, married, on SSD, lives in [place], has a history of depression, history of delusional parasitosis, chronic pain, fibromyalgia, hypothyroidism, hypertension, presented to the clinic today for a follow-up visit.  Patient has limited mobility and hence walks with the help of a walker, stooped over while walking.  She has chronic pain syndrome.  Patient today reports there is a lot going on in her life right now.  She reports she continues to be taking care of her mother who has dementia as well as psychosis.  Patient reports she recently hired someone to sit with her mother at night.  She reports her mother is not too happy about the same.  Patient reports her husband also seems to be struggling with his own mental health issues.  Patient reports because of her recent stressors she has a lot of racing thoughts at night.  She reports that makes it difficult for her to rest at night.  She tried the trazodone 50 mg and that has not been effective.  Discussed with patient about increasing the dose of trazodone and she agrees with plan.  Also discussed adding hydroxyzine as needed for anxiety.  Patient continues to be compliant with Wellbutrin, denies any side effects.  Patient reports she had her biopsy results back and they have ruled out giant cell arteritis. Pt reports  that has been a relief.  She reports they are currently tapering her off of her prednisone.  Patient continues to be motivated to start psychotherapy with Ms. Peacock.  She has an appointment today. Visit Diagnosis:    ICD-10-CM   1. MDD (major depressive disorder), recurrent episode, moderate (HCC) F33.1 buPROPion (WELLBUTRIN XL) 300 MG 24 hr tablet  2. Insomnia due to mental disorder F51.05     Past Psychiatric  History: Pt reports a history of being admitted to mental health [facility] for 3 days in 2016 for delusional parasitosis.  She reports being treated by a psychiatrist at [facility] in the past for depression.  Denies suicide attempt in the past.  Past trials of Latuda, Wellbutrin.  Past Medical History:  Past Medical History:  Diagnosis Date  . Anemia    in the past  . Anxiety   . Arthritis   . Cervical spondylosis with myelopathy 2018  . Chronic pain 2019  . DDD (degenerative disc disease), lumbar   . Depression   . Displacement of cervical intervertebral disc 2018  . Fibromyalgia   . GERD (gastroesophageal reflux disease)   . Heart murmur    not treated or being followed  . Hypertension   . Hypothyroidism   . Lumbar post-laminectomy syndrome 2018  . Lumbosacral radiculitis 2018  . Osteoporosis   . Other long term (current) drug therapy 2019   from pain management  . Pain in the coccyx 2018   disorder of sacrum  . Spondylolisthesis 2018  . Spondylosis of lumbosacral region without myelopathy or radiculopathy 2018  . Spondylosis with myelopathy, lumbar region 2018    Past Surgical History:  Procedure Laterality Date  . ABDOMINAL HYSTERECTOMY  2000  . APPENDECTOMY    . arm surgery  2002  . CESAREAN SECTION     x 2  . CHOLECYSTECTOMY    . epidural injection  2018   steroids  . GASTRIC BYPASS  2004  . JOINT REPLACEMENT Bilateral 2005   knees  . LUMBAR FUSION  2016   x 2  . SHOULDER ARTHROSCOPY WITH ROTATOR CUFF REPAIR Right 2008   x 3  . SPINAL CORD STIMULATOR IMPLANT  2018   [facility] scientific  . TONSILLECTOMY      Family Psychiatric History: Mother-depression, anxiety  Family History:  Family History  Problem Relation Age of Onset  . Kidney disease Mother   . Cancer Mother   . Anxiety disorder Mother   . Depression Mother   . Cancer Father   . Hypertension Father   . Cancer Brother    Substance abuse history: Denies  Social  History: Married.  She moved from  to [place] in Nov of 2018.  She lives with her husband in [place].  She has 2 children, a son and a daughter who are adults.  She used to work in the past but currently disabled due to her chronic pain and physical disability from the same Social History   Socioeconomic History  . Marital status: Married    Spouse name: brady   . Number of children: 2  . Years of education: Not on file  . Highest education level: High school graduate  Occupational History    Comment: disabled   Social Needs  . Financial resource strain: Somewhat hard  . Food insecurity:    Worry: Never true    Inability: Never true  . Transportation needs:    Medical: No    Non-medical: No  Tobacco Use  . Smoking status: Former Smoker    Packs/day: 0.50    Types: Cigarettes    Last attempt to quit: 05/21/2007    Years since quitting: 10.2  . Smokeless tobacco: Never Used  Substance and Sexual Activity  . Alcohol use: No    Frequency: Never  . Drug use: No    Comment: managed by pain clinic  . Sexual activity: Not Currently  Lifestyle  . Physical activity:    Days per week: 0 days    Minutes per session: 0 min  . Stress: Not on file  Relationships  . Social connections:    Talks on phone: Three times a week    Gets together: Once a week    Attends religious service: More than 4 times per year    Active member of club or organization: No    Attends meetings of clubs or organizations: Never    Relationship status: Married  Other Topics Concern  . Not on file  Social History Narrative  . Not on file    Allergies:  Allergies  Allergen Reactions  . Other Rash  . Tape Rash    Skin peels off and blisters.  Probably was clear plastic tape.    Metabolic Disorder Labs: No results found for: HGBA1C, MPG No results found for: PROLACTIN No results found for: CHOL, TRIG, HDL, CHOLHDL, VLDL, LDLCALC No results found for: TSH  Therapeutic Level  Labs: No results found for: LITHIUM No results found for: VALPROATE No components found for:  CBMZ  Current Medications: Current Outpatient Medications  Medication Sig Dispense Refill  . buPROPion (WELLBUTRIN XL) 300 MG 24 hr tablet Take 1 tablet (300 mg total) by mouth daily. 90 tablet 1  . gabapentin (NEURONTIN) 300 MG capsule Take 1 capsule (300 mg total) by mouth 2 (two) times daily. 60 capsule 2  . glycopyrrolate (ROBINUL) 1 MG  tablet Take 1 mg by mouth 2 (two) times daily.   3  . ibuprofen (ADVIL,MOTRIN) 200 MG tablet Take 400 mg by mouth daily as needed for headache or moderate pain.    Marland Kitchen levorphanol (LEVODROMORAN) 2 MG tablet 3 mg in am, 2 mg in afternoon, and 2mg  evening For chronic pain To last 30 days from fill date Fill on or after:08/11/17, 09/09/17, 10/09/17 105 tablet 0  . levothyroxine (SYNTHROID, LEVOTHROID) 25 MCG tablet Take 25 mcg by mouth once daily before breakfast  0  . lipase/protease/amylase (CREON) 36000 UNITS CPEP capsule Take 36,000 Units by mouth 3 (three) times daily with meals.    Marland Kitchen loratadine (CLARITIN) 10 MG tablet Take 10 mgs by mouth once daily in the evening    . metoprolol succinate (TOPROL-XL) 100 MG 24 hr tablet Take 100 mg by mouth once daily in the evening  0  . montelukast (SINGULAIR) 10 MG tablet Take by mouth.    . ondansetron (ZOFRAN-ODT) 8 MG disintegrating tablet Take 8 mg by mouth daily as needed for nausea or vomiting.    . pantoprazole (PROTONIX) 40 MG tablet Take 40 mg by mouth once daily in the evening    . predniSONE (DELTASONE) 20 MG tablet Take 3 tabs (60 mg ) in the morning , 30 days    . vitamin B-12 (CYANOCOBALAMIN) 1000 MCG tablet Take 1,000 mcg by mouth daily.    . Vitamin D, Ergocalciferol, (DRISDOL) 50000 units CAPS capsule Take one capsule once a week for 12 weeks    . hydrOXYzine (ATARAX/VISTARIL) 10 MG tablet Take 1-2 tablets (10-20 mg total) by mouth at bedtime as needed for anxiety. 180 tablet 1  . traZODone (DESYREL) 100 MG  tablet Take 1-2 tablets (100-200 mg total) by mouth at bedtime as needed for sleep. 180 tablet 1   No current facility-administered medications for this visit.      Musculoskeletal: Strength & Muscle Tone: within normal limits Gait & Station: walks with walker Patient leans: Front  Psychiatric Specialty Exam: Review of Systems  Psychiatric/Behavioral: The patient is nervous/anxious and has insomnia.   All other systems reviewed and are negative.   Blood pressure 136/79, pulse 65, temperature 98.5 F (36.9 C), temperature source Oral, weight 206 lb (93.4 kg).Body mass index is 38.92 kg/m.  General Appearance: Casual  Eye Contact:  Fair  Speech:  Normal Rate  Volume:  Normal  Mood:  Anxious  Affect:  Congruent  Thought Process:  Goal Directed and Descriptions of Associations: Intact  Orientation:  Full (Time, Place, and Person)  Thought Content: Logical   Suicidal Thoughts:  No  Homicidal Thoughts:  No  Memory:  Immediate;   Fair Recent;   Fair Remote;   Fair  Judgement:  Fair  Insight:  Fair  Psychomotor Activity:  Decreased  Concentration:  Concentration: Fair and Attention Span: Fair  Recall:  [employer] of Knowledge: Fair  Language: Fair  Akathisia:  No  Handed:  Right  AIMS (if indicated): NA  Assets:  Communication Skills Desire for Improvement Housing Social Support  ADL's:  Intact  Cognition: WNL  Sleep:  Poor   Screenings: PHQ2-9     Clinical Support from 08/04/2017 in [facility] Clinical Support from 06/04/2017 in [facility] Clinical Support from 05/11/2017 in [facility] Office Visit from 04/23/2017 in [facility]  PHQ-2 Total Score  0  0  5  0  PHQ-9 Total Score  -  -  13  -       Assessment and Plan: Connie [name] is a 62 year old Caucasian female who has a history of depression,  chronic pain, GERD, hypothyroidism, hypertension, fibromyalgia, presented to the clinic today for a follow-up visit.  Patient continues to struggle with some depression, anxiety as well as sleep problems.  She has several psychosocial stressors including her chronic pain issues, immobility, her mother who has dementia as well as psychosis( she moved in with her),her own recent medical issues and so on.  Patient however is motivated to stay in therapy as well as medication management.  Plan as noted below.  Plan MDD Continue Wellbutrin XL 300 mg p.o. nightly Continue CBT with Ms. Peacock  Insomnia-continues to be restless. Patient's sleep issues also due to her chronic pain, sleeping on an air mattress or recliner at night and so on. Will increase her trazodone to 100-200 mg p.o. nightly as needed Will also give her hydroxyzine 10-20 mg p.o. nightly as needed.  Patient continues to follow-up with her pain provider as well as her PMD for management of her medical issues.  Follow-up in clinic in a month or sooner if needed.  More than 50 % of the time was spent for psychoeducation and supportive psychotherapy and care coordination.  This note was generated in part or whole with voice recognition software. Voice recognition is usually quite accurate but there are transcription errors that can and very often do occur. I apologize for any typographical errors that were not detected and corrected.       Ursula Alert, MD 08/06/2017, 8:57 PM
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
## 2017-08-07 ENCOUNTER — Inpatient Hospital Stay
Admission: RE | Admit: 2017-08-07 | Discharge: 2017-08-07 | Disposition: A | Discharge status: Home / Self Care | Payer: Self-pay | Source: Ambulatory Visit | Attending: *Deleted | Admitting: *Deleted

## 2017-08-07 ENCOUNTER — Other Ambulatory Visit: Payer: Self-pay | Admitting: *Deleted

## 2017-08-07 DIAGNOSIS — Z9289 Personal history of other medical treatment: Secondary | ICD-10-CM

## 2017-08-10 ENCOUNTER — Ambulatory Visit
Admission: RE | Admit: 2017-08-10 | Discharge: 2017-08-10 | Disposition: A | Discharge status: Home / Self Care | Payer: Managed Care, Other (non HMO) | Source: Ambulatory Visit | Attending: Internal Medicine | Admitting: Internal Medicine

## 2017-08-10 DIAGNOSIS — Z1231 Encounter for screening mammogram for malignant neoplasm of breast: Secondary | ICD-10-CM

## 2017-08-19 ENCOUNTER — Encounter: Payer: Self-pay | Admitting: Student in an Organized Health Care Education/Training Program

## 2017-08-19 ENCOUNTER — Ambulatory Visit (HOSPITAL_BASED_OUTPATIENT_CLINIC_OR_DEPARTMENT_OTHER): Payer: Managed Care, Other (non HMO) | Admitting: Student in an Organized Health Care Education/Training Program

## 2017-08-19 ENCOUNTER — Other Ambulatory Visit: Payer: Self-pay

## 2017-08-19 ENCOUNTER — Ambulatory Visit
Admission: RE | Admit: 2017-08-19 | Discharge: 2017-08-19 | Disposition: A | Discharge status: Home / Self Care | Payer: Managed Care, Other (non HMO) | Source: Ambulatory Visit | Attending: Student in an Organized Health Care Education/Training Program | Admitting: Student in an Organized Health Care Education/Training Program

## 2017-08-19 DIAGNOSIS — Z9889 Other specified postprocedural states: Secondary | ICD-10-CM | POA: Diagnosis not present

## 2017-08-19 DIAGNOSIS — Z9689 Presence of other specified functional implants: Secondary | ICD-10-CM | POA: Insufficient documentation

## 2017-08-19 DIAGNOSIS — Z79899 Other long term (current) drug therapy: Secondary | ICD-10-CM | POA: Insufficient documentation

## 2017-08-19 DIAGNOSIS — M47812 Spondylosis without myelopathy or radiculopathy, cervical region: Secondary | ICD-10-CM | POA: Insufficient documentation

## 2017-08-19 DIAGNOSIS — Z9884 Bariatric surgery status: Secondary | ICD-10-CM | POA: Diagnosis not present

## 2017-08-19 DIAGNOSIS — Z981 Arthrodesis status: Secondary | ICD-10-CM | POA: Insufficient documentation

## 2017-08-19 DIAGNOSIS — M542 Cervicalgia: Secondary | ICD-10-CM | POA: Diagnosis present

## 2017-08-19 DIAGNOSIS — Z90710 Acquired absence of both cervix and uterus: Secondary | ICD-10-CM | POA: Insufficient documentation

## 2017-08-19 DIAGNOSIS — Z9049 Acquired absence of other specified parts of digestive tract: Secondary | ICD-10-CM | POA: Insufficient documentation

## 2017-08-19 MED ORDER — DEXAMETHASONE SODIUM PHOSPHATE 10 MG/ML IJ SOLN
10.0000 mg | Freq: Once | INTRAMUSCULAR | Status: AC
Start: 2017-08-19 — End: 2017-08-19
  Administered 2017-08-19: 10 mg

## 2017-08-19 MED ORDER — DEXAMETHASONE SODIUM PHOSPHATE 10 MG/ML IJ SOLN
INTRAMUSCULAR | Status: AC
  Filled 2017-08-19: qty 1

## 2017-08-19 MED ORDER — LIDOCAINE HCL (PF) 1 % IJ SOLN
10.0000 mL | Freq: Once | INTRAMUSCULAR | Status: AC
  Administered 2017-08-19: 5 mL

## 2017-08-19 MED ORDER — LACTATED RINGERS IV SOLN
1000.0000 mL | Freq: Once | INTRAVENOUS | Status: AC
  Administered 2017-08-19: 1000 mL via INTRAVENOUS

## 2017-08-19 MED ORDER — ROPIVACAINE HCL 2 MG/ML IJ SOLN
INTRAMUSCULAR | Status: AC
Start: 2017-08-19 — End: 2017-08-19
  Filled 2017-08-19: qty 10

## 2017-08-19 MED ORDER — FENTANYL CITRATE (PF) 100 MCG/2ML IJ SOLN
INTRAMUSCULAR | Status: AC
  Filled 2017-08-19: qty 2

## 2017-08-19 MED ORDER — ROPIVACAINE HCL 2 MG/ML IJ SOLN
10.0000 mL | Freq: Once | INTRAMUSCULAR | Status: AC
  Administered 2017-08-19: 10 mL

## 2017-08-19 MED ORDER — FENTANYL CITRATE (PF) 100 MCG/2ML IJ SOLN
25.0000 ug | INTRAMUSCULAR | Status: AC | PRN
  Administered 2017-08-19: 100 ug via INTRAVENOUS
  Administered 2017-08-19: 50 ug via INTRAVENOUS

## 2017-08-19 MED ORDER — LIDOCAINE HCL (PF) 1 % IJ SOLN
INTRAMUSCULAR | Status: AC
Start: 2017-08-19 — End: 2017-08-19
  Filled 2017-08-19: qty 5

## 2017-08-19 NOTE — Progress Notes (Signed)
Patient's Name: Connie [name]  MRN: [id number]  Referring Provider: [name], MD  DOB: [birth year]  PCP: Tracie Harrier, MD  DOS: 08/19/2017  Note by: [name], MD  Service setting: Ambulatory outpatient  Specialty: Interventional Pain Management  Patient type: Established  Location: [facility] (AMB) Pain Management Facility  Visit type: Interventional Procedure   Primary Reason for Visit: Interventional Pain Management Treatment. CC: Neck Pain  Procedure:       Anesthesia, Analgesia, Anxiolysis:  Type: Cervical Facet Medial Branch Block(s) #1  Primary Purpose: Diagnostic Region: Posterolateral cervical spine Level:  C4, C5, C6, & C7 Medial Branch Level(s). Injecting these levels blocks the  C4-5, C5-6, and C6-7 cervical facet joints. Laterality: Bilateral Paraspinal  Type: Moderate (Conscious) Sedation combined with Local Anesthesia Indication(s): Analgesia and Anxiety Route: Intravenous (IV) IV Access: Secured Sedation: Meaningful verbal contact was maintained at all times during the procedure  Local Anesthetic: Lidocaine 1-2%   Indications: 1. Spondylosis of cervical region without myelopathy or radiculopathy    Pain Score: Pre-procedure: 0-No pain/10 Post-procedure: 0-No pain/10  Pre-op Assessment:  Connie [name] is a 62 y.o. (year old), female patient, seen today for interventional treatment. She  has a past surgical history that includes Cesarean section; Gastric bypass (2004); Lumbar fusion (2016); Spinal cord stimulator implant (2018); Cholecystectomy; Appendectomy; Tonsillectomy; Shoulder arthroscopy with rotator cuff repair (Right, 2008); Joint replacement (Bilateral, 2005); Abdominal hysterectomy (2000); epidural injection (2018); arm surgery (2002); and Breast biopsy (Left, 2005). Connie [name] has a current medication list which includes the following prescription(s): bupropion, gabapentin, glycopyrrolate, hydroxyzine, ibuprofen, levorphanol, levothyroxine,  lipase/protease/amylase, metoprolol succinate, montelukast, ondansetron, pantoprazole, prednisone, trazodone, vitamin b-12, vitamin d (ergocalciferol), and loratadine, and the following Facility-Administered Medications: lactated ringers. Her primarily concern today is the Neck Pain  Initial Vital Signs:  Pulse/HCG Rate: (!) 56ECG Heart Rate: 60 Temp: 98.3 F (36.8 C) Resp: 18 BP: 102/66 SpO2: 95 %  BMI: Estimated body mass index is 38.92 kg/m as calculated from the following:   Height as of this encounter: 5\' 1"  (1.549 m).   Weight as of this encounter: 206 lb (93.4 kg).  Risk Assessment: Allergies: Reviewed. She is allergic to other and tape.  Allergy Precautions: None required Coagulopathies: Reviewed. None identified.  Blood-thinner therapy: None at this time Active Infection(s): Reviewed. None identified. Connie [name] is afebrile  Site Confirmation: Connie [name] was asked to confirm the procedure and laterality before marking the site Procedure checklist: Completed Consent: Before the procedure and under the influence of no sedative(s), amnesic(s), or anxiolytics, the patient was informed of the treatment options, risks and possible complications. To fulfill our ethical and legal obligations, as recommended by the American Medical Association's Code of Ethics, I have informed the patient of my clinical impression; the nature and purpose of the treatment or procedure; the risks, benefits, and possible complications of the intervention; the alternatives, including doing nothing; the risk(s) and benefit(s) of the alternative treatment(s) or procedure(s); and the risk(s) and benefit(s) of doing nothing. The patient was provided information about the general risks and possible complications associated with the procedure. These may include, but are not limited to: failure to achieve desired goals, infection, bleeding, organ or nerve damage, allergic reactions, paralysis, and death. In addition,  the patient was informed of those risks and complications associated to Spine-related procedures, such as failure to decrease pain; infection (i.e.: Meningitis, epidural or intraspinal abscess); bleeding (i.e.: epidural hematoma, subarachnoid hemorrhage, or any other type of intraspinal or peri-dural bleeding); organ or nerve damage (  i.e.: Any type of peripheral nerve, nerve root, or spinal cord injury) with subsequent damage to sensory, motor, and/or autonomic systems, resulting in permanent pain, numbness, and/or weakness of one or several areas of the body; allergic reactions; (i.e.: anaphylactic reaction); and/or death. Furthermore, the patient was informed of those risks and complications associated with the medications. These include, but are not limited to: allergic reactions (i.e.: anaphylactic or anaphylactoid reaction(s)); adrenal axis suppression; blood sugar elevation that in diabetics may result in ketoacidosis or comma; water retention that in patients with history of congestive heart failure may result in shortness of breath, pulmonary edema, and decompensation with resultant heart failure; weight gain; swelling or edema; medication-induced neural toxicity; particulate matter embolism and blood vessel occlusion with resultant organ, and/or nervous system infarction; and/or aseptic necrosis of one or more joints. Finally, the patient was informed that Medicine is not an exact science; therefore, there is also the possibility of unforeseen or unpredictable risks and/or possible complications that may result in a catastrophic outcome. The patient indicated having understood very clearly. We have given the patient no guarantees and we have made no promises. Enough time was given to the patient to ask questions, all of which were answered to the patient's satisfaction. Connie [name] has indicated that she wanted to continue with the procedure. Attestation: I, the ordering provider, attest that I have  discussed with the patient the benefits, risks, side-effects, alternatives, likelihood of achieving goals, and potential problems during recovery for the procedure that I have provided informed consent. Date  Time: 08/19/2017  8:12 AM  Pre-Procedure Preparation:  Monitoring: As per clinic protocol. Respiration, ETCO2, SpO2, BP, heart rate and rhythm monitor placed and checked for adequate function Safety Precautions: Patient was assessed for positional comfort and pressure points before starting the procedure. Time-out: I initiated and conducted the "Time-out" before starting the procedure, as per protocol. The patient was asked to participate by confirming the accuracy of the "Time Out" information. Verification of the correct person, site, and procedure were performed and confirmed by me, the nursing staff, and the patient. "Time-out" conducted as per Joint Commission's Universal Protocol (UP.01.01.01). Time: 1638  Description of Procedure:       Position: Prone with head of the table raised to facilitate breathing. Laterality: Bilateral. The procedure was performed in identical fashion on both sides. Level: C3, C4, C5, C6, & C7 Medial Branch Level(s). Area Prepped: Posterior Cervico-thoracic Region Prepping solution: ChloraPrep (2% chlorhexidine gluconate and 70% isopropyl alcohol) Safety Precautions: Aspiration looking for blood return was conducted prior to all injections. At no point did we inject any substances, as a needle was being advanced. Before injecting, the patient was told to immediately notify me if she was experiencing any new onset of "ringing in the ears, or metallic taste in the mouth". No attempts were made at seeking any paresthesias. Safe injection practices and needle disposal techniques used. Medications properly checked for expiration dates. SDV (single dose vial) medications used. After the completion of the procedure, all disposable equipment used was discarded in the  proper designated medical waste containers. Local Anesthesia: Protocol guidelines were followed. The patient was positioned over the fluoroscopy table. The area was prepped in the usual manner. The time-out was completed. The target area was identified using fluoroscopy. A 12-in long, straight, sterile hemostat was used with fluoroscopic guidance to locate the targets for each level blocked. Once located, the skin was marked with an approved surgical skin marker. Once all sites were marked, the skin (  epidermis, dermis, and hypodermis), as well as deeper tissues (fat, connective tissue and muscle) were infiltrated with a small amount of a short-acting local anesthetic, loaded on a 10cc syringe with a 25G, 1.5-in  Needle. An appropriate amount of time was allowed for local anesthetics to take effect before proceeding to the next step. Local Anesthetic: Lidocaine 1.0% The unused portion of the local anesthetic was discarded in the proper designated containers. Technical explanation of process:   C4 Medial Branch Nerve Block (MBB): The target area for the C4 dorsal medial articular branch is the lateral concave waist of the articular pillar of C4. Under fluoroscopic guidance, a Quincke needle was inserted until contact was made with os over the postero-lateral aspect of the articular pillar of C4 (target area). After negative aspiration for blood,57mL of the nerve block solution was injected without difficulty or complication. The needle was removed intact. C5 Medial Branch Nerve Block (MBB): The target area for the C5 dorsal medial articular branch is the lateral concave waist of the articular pillar of C5. Under fluoroscopic guidance, a Quincke needle was inserted until contact was made with os over the postero-lateral aspect of the articular pillar of C5 (target area). After negative aspiration for blood, 29mL of the nerve block solution was injected without difficulty or complication. The needle was removed  intact. C6 Medial Branch Nerve Block (MBB): The target area for the C6 dorsal medial articular branch is the lateral concave waist of the articular pillar of C6. Under fluoroscopic guidance, a Quincke needle was inserted until contact was made with os over the postero-lateral aspect of the articular pillar of C6 (target area). After negative aspiration for blood, 38mL of the nerve block solution was injected without difficulty or complication. The needle was removed intact. C7 Medial Branch Nerve Block (MBB): The target for the C7 dorsal medial articular branch lies on the superior-medial tip of the C7 transverse process. Under fluoroscopic guidance, a Quincke needle was inserted until contact was made with os over the postero-lateral aspect of the articular pillar of C7 (target area). After negative aspiration for blood, 1 mL of the nerve block solution was injected without difficulty or complication. The needle was removed intact. Procedural Needles: 22-gauge, 3.5-inch, Quincke needles used for all levels. Nerve block solution: 10 cc solution made of 9 cc of 0.2% ropivacaine and 1 cc of Decadron (10 mg/cc). 1 cc injected at each level bilaterally. The unused portion of the solution was discarded in the proper designated containers.  Once the entire procedure was completed, the treated area was cleaned, making sure to leave some of the prepping solution back to take advantage of its long term bactericidal properties.  Vitals:   08/19/17 0907 08/19/17 0915 08/19/17 0925 08/19/17 0934  BP: 112/65 (!) 86/68 104/61 100/61  Pulse:      Resp: 10 11 14 13   Temp:  (!) 97.3 F (36.3 C)    TempSrc:      SpO2: 94% 94% 98% 100%  Weight:      Height:        Start Time: 0848 hrs. End Time: 0907 hrs.  Imaging Guidance (Spinal):  Type of Imaging Technique: Fluoroscopy Guidance (Spinal) Indication(s): Assistance in needle guidance and placement for procedures requiring needle placement in or near specific  anatomical locations not easily accessible without such assistance. Exposure Time: Please see nurses notes. Contrast: None used. Fluoroscopic Guidance: I was personally present during the use of fluoroscopy. "Tunnel Vision Technique" used to obtain the best  possible view of the target area. Parallax error corrected before commencing the procedure. "Direction-depth-direction" technique used to introduce the needle under continuous pulsed fluoroscopy. Once target was reached, antero-posterior, oblique, and lateral fluoroscopic projection used confirm needle placement in all planes. Images permanently stored in EMR. Interpretation: No contrast injected. I personally interpreted the imaging intraoperatively. Adequate needle placement confirmed in multiple planes. Permanent images saved into the patient's record.  Antibiotic Prophylaxis:   Anti-infectives (From admission, onward)   None     Indication(s): None identified  Post-operative Assessment:  Post-procedure Vital Signs:  Pulse/HCG Rate: (!) 5666 Temp: (!) 97.3 F (36.3 C) Resp: 13 BP: 100/61 SpO2: 100 %  EBL: None  Complications: No immediate post-treatment complications observed by team, or reported by patient.  Note: The patient tolerated the entire procedure well. A repeat set of vitals were taken after the procedure and the patient was kept under observation following institutional policy, for this type of procedure. Post-procedural neurological assessment was performed, showing return to baseline, prior to discharge. The patient was provided with post-procedure discharge instructions, including a section on how to identify potential problems. Should any problems arise concerning this procedure, the patient was given instructions to immediately contact us, at any time, without hesitation. In any case, we plan to contact the patient by telephone for a follow-up status report regarding this interventional procedure.  Comments:  No  additional relevant information. 5 out of 5 strength bilateral upper extremity: Shoulder abduction, elbow flexion, elbow extension, thumb extension.  Plan of Care    Imaging Orders     DG C-Arm 1-60 Min-No Report Procedure Orders    No procedure(s) ordered today    Medications ordered for procedure: Meds ordered this encounter  Medications  . lactated ringers infusion 1,000 mL  . fentaNYL (SUBLIMAZE) injection 25-100 mcg    Make sure Narcan is available in the pyxis when using this medication. In the event of respiratory depression (RR< 8/min): Titrate NARCAN (naloxone) in increments of 0.1 to 0.2 mg IV at 2-3 minute intervals, until desired degree of reversal.  . lidocaine (PF) (XYLOCAINE) 1 % injection 10 mL  . ropivacaine (PF) 2 mg/mL (0.2%) (NAROPIN) injection 10 mL  . dexamethasone (DECADRON) injection 10 mg   Medications administered: We administered lactated ringers, fentaNYL, lidocaine (PF), ropivacaine (PF) 2 mg/mL (0.2%), and dexamethasone.  See the medical record for exact dosing, route, and time of administration.  New Prescriptions   No medications on file   Disposition: Discharge home  Discharge Date & Time: 08/19/2017; 0935 hrs.   Physician-requested Follow-up: Return in about 3 weeks (around 09/09/2017) for Post Procedure Evaluation.  Future Appointments  Date Time Provider [place]  09/14/2017  1:30 PM [name], MD [facility] None  09/17/2017 10:30 AM Ursula Alert, MD [facility] None  09/17/2017 11:00 AM Lubertha South, LCSW [facility] None  11/03/2017 12:00 PM [name], MD [facility] None   Primary Care Physician: Tracie Harrier, MD Location: [facility] Note by: [name], MD Date: 08/19/2017; Time: 9:43 AM  Disclaimer:  Medicine is not an exact science. The only guarantee in medicine is that nothing is guaranteed. It is important to note that the decision to proceed with this intervention was based on  the information collected from the patient. The Data and conclusions were drawn from the patient's questionnaire, the interview, and the physical examination. Because the information was provided in large part by the patient, it cannot be guaranteed that it has not been  purposely or unconsciously manipulated. Every effort has been made to obtain as much relevant data as possible for this evaluation. It is important to note that the conclusions that lead to this procedure are derived in large part from the available data. Always take into account that the treatment will also be dependent on availability of resources and existing treatment guidelines, considered by other Pain Management Practitioners as being common knowledge and practice, at the time of the intervention. For Medico-Legal purposes, it is also important to point out that variation in procedural techniques and pharmacological choices are the acceptable norm. The indications, contraindications, technique, and results of the above procedure should only be interpreted and judged by a Board-Certified Interventional Pain Specialist with extensive familiarity and expertise in the same exact procedure and technique.

## 2017-08-19 NOTE — Patient Instructions (Signed)

## 2017-08-19 NOTE — Progress Notes (Signed)
Safety precautions to be maintained throughout the outpatient stay will include: orient to surroundings, keep bed in low position, maintain call bell within reach at all times, provide assistance with transfer out of bed and ambulation.  

## 2017-08-20 ENCOUNTER — Telehealth: Payer: Self-pay | Admitting: *Deleted

## 2017-08-20 NOTE — Telephone Encounter (Signed)
Spoke with patient re; procedure on yesterday, denies any questions or concerns.  

## 2017-08-25 ENCOUNTER — Encounter
Admission: RE | Disposition: A | Discharge status: Home / Self Care | Payer: Self-pay | Source: Ambulatory Visit | Attending: Internal Medicine

## 2017-08-25 ENCOUNTER — Ambulatory Visit: Payer: Managed Care, Other (non HMO) | Admitting: Certified Registered Nurse Anesthetist

## 2017-08-25 ENCOUNTER — Ambulatory Visit
Admission: RE | Admit: 2017-08-25 | Discharge: 2017-08-25 | Disposition: A | Discharge status: Home / Self Care | Payer: Managed Care, Other (non HMO) | Source: Ambulatory Visit | Attending: Internal Medicine | Admitting: Internal Medicine

## 2017-08-25 ENCOUNTER — Encounter: Payer: Self-pay | Admitting: Anesthesiology

## 2017-08-25 DIAGNOSIS — Z1211 Encounter for screening for malignant neoplasm of colon: Secondary | ICD-10-CM | POA: Insufficient documentation

## 2017-08-25 DIAGNOSIS — Z8371 Family history of colonic polyps: Secondary | ICD-10-CM | POA: Insufficient documentation

## 2017-08-25 DIAGNOSIS — I10 Essential (primary) hypertension: Secondary | ICD-10-CM | POA: Insufficient documentation

## 2017-08-25 DIAGNOSIS — M1990 Unspecified osteoarthritis, unspecified site: Secondary | ICD-10-CM | POA: Insufficient documentation

## 2017-08-25 DIAGNOSIS — Z888 Allergy status to other drugs, medicaments and biological substances status: Secondary | ICD-10-CM | POA: Diagnosis not present

## 2017-08-25 DIAGNOSIS — M810 Age-related osteoporosis without current pathological fracture: Secondary | ICD-10-CM | POA: Diagnosis not present

## 2017-08-25 DIAGNOSIS — E039 Hypothyroidism, unspecified: Secondary | ICD-10-CM | POA: Insufficient documentation

## 2017-08-25 DIAGNOSIS — R011 Cardiac murmur, unspecified: Secondary | ICD-10-CM | POA: Insufficient documentation

## 2017-08-25 DIAGNOSIS — K5730 Diverticulosis of large intestine without perforation or abscess without bleeding: Secondary | ICD-10-CM | POA: Diagnosis not present

## 2017-08-25 DIAGNOSIS — Z79899 Other long term (current) drug therapy: Secondary | ICD-10-CM | POA: Diagnosis not present

## 2017-08-25 DIAGNOSIS — M797 Fibromyalgia: Secondary | ICD-10-CM | POA: Insufficient documentation

## 2017-08-25 DIAGNOSIS — F209 Schizophrenia, unspecified: Secondary | ICD-10-CM | POA: Diagnosis not present

## 2017-08-25 DIAGNOSIS — K529 Noninfective gastroenteritis and colitis, unspecified: Secondary | ICD-10-CM | POA: Diagnosis not present

## 2017-08-25 DIAGNOSIS — F419 Anxiety disorder, unspecified: Secondary | ICD-10-CM | POA: Diagnosis not present

## 2017-08-25 DIAGNOSIS — K219 Gastro-esophageal reflux disease without esophagitis: Secondary | ICD-10-CM | POA: Insufficient documentation

## 2017-08-25 DIAGNOSIS — F329 Major depressive disorder, single episode, unspecified: Secondary | ICD-10-CM | POA: Diagnosis not present

## 2017-08-25 HISTORY — PX: COLONOSCOPY WITH PROPOFOL: SHX5780

## 2017-08-25 SURGERY — COLONOSCOPY WITH PROPOFOL
Anesthesia: General

## 2017-08-25 MED ORDER — PROPOFOL 500 MG/50ML IV EMUL
INTRAVENOUS | Status: DC | PRN
  Administered 2017-08-25: 150 ug/kg/min via INTRAVENOUS

## 2017-08-25 MED ORDER — EPHEDRINE SULFATE 50 MG/ML IJ SOLN
INTRAMUSCULAR | Status: DC | PRN
  Administered 2017-08-25: 10 mg via INTRAVENOUS
  Administered 2017-08-25: 5 mg via INTRAVENOUS

## 2017-08-25 MED ORDER — SODIUM CHLORIDE 0.9 % IV SOLN
INTRAVENOUS | Status: DC
  Administered 2017-08-25: 1000 mL via INTRAVENOUS

## 2017-08-25 MED ORDER — PROPOFOL 10 MG/ML IV BOLUS
INTRAVENOUS | Status: DC | PRN
  Administered 2017-08-25: 70 mg via INTRAVENOUS

## 2017-08-25 NOTE — Anesthesia Preprocedure Evaluation (Signed)
Anesthesia Evaluation  Patient identified by MRN, date of birth, ID band Patient awake    Reviewed: Allergy & Precautions, H&P , NPO status , Patient's Chart, lab work & pertinent test results, reviewed documented beta blocker date and time   History of Anesthesia Complications Negative for: history of anesthetic complications  Airway Mallampati: I  TM Distance: >3 FB Neck ROM: full    Dental  (+) Caps, Dental Advidsory Given, Missing   Pulmonary neg pulmonary ROS, former smoker,           Cardiovascular Exercise Tolerance: Good hypertension, (-) angina(-) CAD, (-) Past MI, (-) Cardiac Stents and (-) CABG (-) dysrhythmias + Valvular Problems/Murmurs      Neuro/Psych neg Seizures PSYCHIATRIC DISORDERS Anxiety Depression Schizophrenia  Neuromuscular disease    GI/Hepatic Neg liver ROS, GERD  ,  Endo/Other  neg diabetesHypothyroidism   Renal/GU negative Renal ROS  negative genitourinary   Musculoskeletal   Abdominal   Peds  Hematology  (+) anemia ,   Anesthesia Other Findings Past Medical History: No date: Anemia     Comment:  in the past No date: Anxiety No date: Arthritis 2018: Cervical spondylosis with myelopathy 2019: Chronic pain No date: DDD (degenerative disc disease), lumbar No date: Depression 2018: Displacement of cervical intervertebral disc No date: Fibromyalgia No date: GERD (gastroesophageal reflux disease) No date: Heart murmur     Comment:  not treated or being followed No date: Hypertension No date: Hypothyroidism 2018: Lumbar post-laminectomy syndrome 2018: Lumbosacral radiculitis No date: Osteoporosis 2019: Other long term (current) drug therapy     Comment:  from pain management 2018: Pain in the coccyx     Comment:  disorder of sacrum 2018: Spondylolisthesis 2018: Spondylosis of lumbosacral region without myelopathy or  radiculopathy 2018: Spondylosis with myelopathy, lumbar  region   Reproductive/Obstetrics negative OB ROS                             Anesthesia Physical Anesthesia Plan  ASA: III  Anesthesia Plan: General   Post-op Pain Management:    Induction: Intravenous  PONV Risk Score and Plan: 3 and Propofol infusion  Airway Management Planned: Nasal Cannula and Natural Airway  Additional Equipment:   Intra-op Plan:   Post-operative Plan:   Informed Consent: I have reviewed the patients History and Physical, chart, labs and discussed the procedure including the risks, benefits and alternatives for the proposed anesthesia with the patient or authorized representative who has indicated his/her understanding and acceptance.   Dental Advisory Given  Plan Discussed with: Anesthesiologist, CRNA and Surgeon  Anesthesia Plan Comments:         Anesthesia Quick Evaluation

## 2017-08-25 NOTE — H&P (Signed)
Outpatient short stay form Pre-procedure 08/25/2017 9:05 AM Connie [name] K. Alice Reichert, M.D.  Primary Physician: Tracie Harrier, M.D.  Reason for visit: Family Hx of polyps of the colon, diarrhea, fecal incontinence. Hx of cholecystectomy.  History of present illness: Patient with above complaints. Otherwise denies abdominal pain.    Current Facility-Administered Medications:  .  0.9 %  sodium chloride infusion, , Intravenous, Continuous, Maupin, Benay Pike, MD, Last Rate: 20 mL/hr at 08/25/17 0848, 1,000 mL at 08/25/17 0848  Medications Prior to Admission  Medication Sig Dispense Refill Last Dose  . buPROPion (WELLBUTRIN XL) 300 MG 24 hr tablet Take 1 tablet (300 mg total) by mouth daily. 90 tablet 1 08/24/2017 at Unknown time  . gabapentin (NEURONTIN) 300 MG capsule Take 1 capsule (300 mg total) by mouth 2 (two) times daily. 60 capsule 2 08/24/2017 at Unknown time  . glycopyrrolate (ROBINUL) 1 MG tablet Take 1 mg by mouth 2 (two) times daily.   3 08/24/2017 at Unknown time  . hydrOXYzine (ATARAX/VISTARIL) 10 MG tablet Take 1-2 tablets (10-20 mg total) by mouth at bedtime as needed for anxiety. 180 tablet 1 08/24/2017 at Unknown time  . ibuprofen (ADVIL,MOTRIN) 200 MG tablet Take 400 mg by mouth daily as needed for headache or moderate pain.   08/24/2017 at Unknown time  . levorphanol (LEVODROMORAN) 2 MG tablet 3 mg in am, 2 mg in afternoon, and 2mg  evening For chronic pain To last 30 days from fill date Fill on or after:08/11/17, 09/09/17, 10/09/17 105 tablet 0 08/25/2017 at 0600  . levothyroxine (SYNTHROID, LEVOTHROID) 25 MCG tablet Take 25 mcg by mouth once daily before breakfast  0 08/24/2017 at Unknown time  . lipase/protease/amylase (CREON) 36000 UNITS CPEP capsule Take 36,000 Units by mouth 3 (three) times daily with meals.   08/24/2017 at Unknown time  . loratadine (CLARITIN) 10 MG tablet Take 10 mgs by mouth once daily in the evening   08/24/2017 at Unknown time  . metoprolol succinate (TOPROL-XL)  100 MG 24 hr tablet Take 100 mg by mouth once daily in the evening  0 08/24/2017 at Unknown time  . montelukast (SINGULAIR) 10 MG tablet Take by mouth.   08/24/2017 at Unknown time  . ondansetron (ZOFRAN-ODT) 8 MG disintegrating tablet Take 8 mg by mouth daily as needed for nausea or vomiting.   08/24/2017 at Unknown time  . pantoprazole (PROTONIX) 40 MG tablet Take 40 mg by mouth once daily in the evening   08/24/2017 at Unknown time  . predniSONE (DELTASONE) 20 MG tablet Take 3 tabs (60 mg ) in the morning , 30 days   08/24/2017 at Unknown time  . traZODone (DESYREL) 100 MG tablet Take 1-2 tablets (100-200 mg total) by mouth at bedtime as needed for sleep. 180 tablet 1 08/24/2017 at Unknown time  . vitamin B-12 (CYANOCOBALAMIN) 1000 MCG tablet Take 1,000 mcg by mouth daily.   08/24/2017 at Unknown time  . Vitamin D, Ergocalciferol, (DRISDOL) 50000 units CAPS capsule Take one capsule once a week for 12 weeks   08/24/2017 at Unknown time     Allergies  Allergen Reactions  . Other Rash  . Tape Rash    Skin peels off and blisters.  Probably was clear plastic tape.     Past Medical History:  Diagnosis Date  . Anemia    in the past  . Anxiety   . Arthritis   . Cervical spondylosis with myelopathy 2018  . Chronic pain 2019  . DDD (degenerative disc disease), lumbar   .  Depression   . Displacement of cervical intervertebral disc 2018  . Fibromyalgia   . GERD (gastroesophageal reflux disease)   . Heart murmur    not treated or being followed  . Hypertension   . Hypothyroidism   . Lumbar post-laminectomy syndrome 2018  . Lumbosacral radiculitis 2018  . Osteoporosis   . Other long term (current) drug therapy 2019   from pain management  . Pain in the coccyx 2018   disorder of sacrum  . Spondylolisthesis 2018  . Spondylosis of lumbosacral region without myelopathy or radiculopathy 2018  . Spondylosis with myelopathy, lumbar region 2018    Review of systems:      Physical Exam  Gen:  Alert, oriented. Appears stated age.  HEENT: [place]/AT. PERRLA. Lungs: CTA, no wheezes. CV: RR nl S1, S2. Abd: soft, benign, no masses. BS+ Ext: No edema. Pulses 2+    Planned procedures: Colonoscopy. The patient understands the nature of the planned procedure, indications, risks, alternatives and potential complications including but not limited to bleeding, infection, perforation, damage to internal organs and possible oversedation/side effects from anesthesia. The patient agrees and gives consent to proceed.  Please refer to procedure notes for findings, recommendations and patient disposition/instructions.    Connie [name] K. Alice Reichert, M.D. Gastroenterology 08/25/2017  9:05 AM

## 2017-08-25 NOTE — Anesthesia Postprocedure Evaluation (Signed)
Anesthesia Post Note  Patient: Connie [name]  Procedure(s) Performed: COLONOSCOPY WITH PROPOFOL (N/A )  Patient location during evaluation: Endoscopy Anesthesia Type: General Level of consciousness: awake and alert Pain management: pain level controlled Vital Signs Assessment: post-procedure vital signs reviewed and stable Respiratory status: spontaneous breathing, nonlabored ventilation, respiratory function stable and patient connected to nasal cannula oxygen Cardiovascular status: blood pressure returned to baseline and stable Postop Assessment: no apparent nausea or vomiting Anesthetic complications: no     Last Vitals:  Vitals:   08/25/17 0941 08/25/17 0945  BP: (!) 90/47 108/63  Pulse:    Resp:    Temp: (!) 36.1 C   SpO2: 99%     Last Pain:  Vitals:   08/25/17 1001  TempSrc:   PainSc: 0-No pain                 Martha Clan

## 2017-08-25 NOTE — Anesthesia Procedure Notes (Signed)
Performed by: Sascha Palma, CRNA Pre-anesthesia Checklist: Patient identified, Emergency Drugs available, Suction available, Patient being monitored and Timeout performed Patient Re-evaluated:Patient Re-evaluated prior to induction Oxygen Delivery Method: Nasal cannula Induction Type: IV induction       

## 2017-08-25 NOTE — Anesthesia Post-op Follow-up Note (Signed)
Anesthesia QCDR form completed.        

## 2017-08-25 NOTE — Op Note (Signed)
[facility] Gastroenterology Patient Name: Connie [name] Procedure Date: 08/25/2017 9:14 AM MRN: [id number] Account #: [id number] Date of Birth: 12-04-1955 Admit Type: Outpatient Age: 62 Room: [facility] ENDO ROOM 2 Gender: Female Note Status: Finalized Procedure:            Colonoscopy Indications:          Colon cancer screening in patient at increased risk:                        Family history of 1st-degree relative with colon polyps Providers:            Benay Pike. Alice Reichert MD, MD Referring MD:         Tracie Harrier, MD (Referring MD) Medicines:            Propofol per Anesthesia Complications:        No immediate complications. Procedure:            Pre-Anesthesia Assessment:                       - The risks and benefits of the procedure and the                        sedation options and risks were discussed with the                        patient. All questions were answered and informed                        consent was obtained.                       - Patient identification and proposed procedure were                        verified prior to the procedure by the nurse. The                        procedure was verified in the procedure room.                       - ASA Grade Assessment: II - A patient with mild                        systemic disease.                       - After reviewing the risks and benefits, the patient                        was deemed in satisfactory condition to undergo the                        procedure.                       After obtaining informed consent, the colonoscope was                        passed under direct vision. Throughout the procedure,  the patient's blood pressure, pulse, and oxygen                        saturations were monitored continuously. The                        Colonoscope was introduced through the anus and                        advanced to the the cecum, identified by  appendiceal                        orifice and ileocecal valve. The colonoscopy was                        performed without difficulty. The patient tolerated the                        procedure well. The quality of the bowel preparation                        was excellent. Findings:      The perianal and digital rectal examinations were normal. Pertinent       negatives include normal sphincter tone and no palpable rectal lesions.      A small polypoid lesion was found at the ileocecal valve. The lesion was       sessile. No bleeding was present. Biopsies were taken with a cold       forceps for histology.      A few small-mouthed diverticula were found in the proximal ascending       colon.      The exam was otherwise without abnormality. Impression:           - Polypoid lesion at the ileocecal valve. Biopsied.                       - The examination was otherwise normal.                       - Biopsies were taken with a cold forceps from the                        random colon for evaluation of microscopic colitis. Recommendation:       - Patient has a contact number available for                        emergencies. The signs and symptoms of potential                        delayed complications were discussed with the patient.                        Return to normal activities tomorrow. Written discharge                        instructions were provided to the patient.                       - Resume previous diet.                       -  Continue present medications.                       - Await pathology results.                       - No recommendation at this time regarding repeat                        colonoscopy.                       - The findings and recommendations were discussed with                        the patient and their family. Procedure Code(s):    --- Professional ---                       [phone], Colonoscopy, flexible; with biopsy, single or                         multiple Diagnosis Code(s):    --- Professional ---                       D49.0, Neoplasm of unspecified behavior of digestive                        system                       Z83.71, Family history of colonic polyps CPT copyright 2017 [employer]. All rights reserved. The codes documented in this report are preliminary and upon coder review may  be revised to meet current compliance requirements. Efrain Sella MD, MD 08/25/2017 9:43:16 AM This report has been signed electronically. Number of Addenda: 0 Note Initiated On: 08/25/2017 9:14 AM Scope Withdrawal Time: 0 hours 7 minutes 4 seconds  Total Procedure Duration: 0 hours 12 minutes 53 seconds       [facility]

## 2017-08-25 NOTE — Interval H&P Note (Signed)
History and Physical Interval Note:  08/25/2017 9:09 AM  Connie [name]  has presented today for surgery, with the diagnosis of CHRONIC DIARRHEA  The various methods of treatment have been discussed with the patient and family. After consideration of risks, benefits and other options for treatment, the patient has consented to  Procedure(s): COLONOSCOPY WITH PROPOFOL (N/A) as a surgical intervention .  The patient's history has been reviewed, patient examined, no change in status, stable for surgery.  I have reviewed the patient's chart and labs.  Questions were answered to the patient's satisfaction.     [place], [place]

## 2017-08-25 NOTE — Transfer of Care (Signed)
Immediate Anesthesia Transfer of Care Note  Patient: Connie [name]  Procedure(s) Performed: COLONOSCOPY WITH PROPOFOL (N/A )  Patient Location: PACU  Anesthesia Type:General  Level of Consciousness: awake, alert  and oriented  Airway & Oxygen Therapy: Patient Spontanous Breathing and Patient connected to nasal cannula oxygen  Post-op Assessment: Report given to RN and Post -op Vital signs reviewed and stable  Post vital signs: Reviewed and stable  Last Vitals:  Vitals Value Taken Time  BP    Temp    Pulse    Resp    SpO2      Last Pain:  Vitals:   08/25/17 0832  TempSrc: Tympanic  PainSc: 0-No pain         Complications: No apparent anesthesia complications

## 2017-08-26 LAB — SURGICAL PATHOLOGY

## 2017-08-27 ENCOUNTER — Encounter: Payer: Self-pay | Admitting: Internal Medicine

## 2017-09-06 ENCOUNTER — Other Ambulatory Visit: Payer: Self-pay | Admitting: Psychiatry

## 2017-09-06 DIAGNOSIS — F5105 Insomnia due to other mental disorder: Secondary | ICD-10-CM

## 2017-09-06 DIAGNOSIS — F331 Major depressive disorder, recurrent, moderate: Secondary | ICD-10-CM

## 2017-09-14 ENCOUNTER — Ambulatory Visit
Payer: Managed Care, Other (non HMO) | Attending: Student in an Organized Health Care Education/Training Program | Admitting: Student in an Organized Health Care Education/Training Program

## 2017-09-14 ENCOUNTER — Encounter: Payer: Self-pay | Admitting: Student in an Organized Health Care Education/Training Program

## 2017-09-14 ENCOUNTER — Ambulatory Visit: Payer: Managed Care, Other (non HMO) | Admitting: Student in an Organized Health Care Education/Training Program

## 2017-09-14 VITALS — BP 127/67 | HR 65 | Temp 98.7°F | Resp 16 | Ht 61.0 in | Wt 205.0 lb

## 2017-09-14 DIAGNOSIS — E559 Vitamin D deficiency, unspecified: Secondary | ICD-10-CM | POA: Diagnosis not present

## 2017-09-14 DIAGNOSIS — M961 Postlaminectomy syndrome, not elsewhere classified: Secondary | ICD-10-CM | POA: Insufficient documentation

## 2017-09-14 DIAGNOSIS — M48061 Spinal stenosis, lumbar region without neurogenic claudication: Secondary | ICD-10-CM | POA: Diagnosis not present

## 2017-09-14 DIAGNOSIS — M542 Cervicalgia: Secondary | ICD-10-CM

## 2017-09-14 DIAGNOSIS — M797 Fibromyalgia: Secondary | ICD-10-CM | POA: Insufficient documentation

## 2017-09-14 DIAGNOSIS — H04123 Dry eye syndrome of bilateral lacrimal glands: Secondary | ICD-10-CM | POA: Insufficient documentation

## 2017-09-14 DIAGNOSIS — E039 Hypothyroidism, unspecified: Secondary | ICD-10-CM | POA: Diagnosis not present

## 2017-09-14 DIAGNOSIS — M5116 Intervertebral disc disorders with radiculopathy, lumbar region: Secondary | ICD-10-CM | POA: Insufficient documentation

## 2017-09-14 DIAGNOSIS — M4716 Other spondylosis with myelopathy, lumbar region: Secondary | ICD-10-CM | POA: Insufficient documentation

## 2017-09-14 DIAGNOSIS — Z79890 Hormone replacement therapy (postmenopausal): Secondary | ICD-10-CM | POA: Insufficient documentation

## 2017-09-14 DIAGNOSIS — Z981 Arthrodesis status: Secondary | ICD-10-CM | POA: Insufficient documentation

## 2017-09-14 DIAGNOSIS — F419 Anxiety disorder, unspecified: Secondary | ICD-10-CM | POA: Insufficient documentation

## 2017-09-14 DIAGNOSIS — Z87891 Personal history of nicotine dependence: Secondary | ICD-10-CM | POA: Diagnosis not present

## 2017-09-14 DIAGNOSIS — M4727 Other spondylosis with radiculopathy, lumbosacral region: Secondary | ICD-10-CM | POA: Insufficient documentation

## 2017-09-14 DIAGNOSIS — I820 Budd-Chiari syndrome: Secondary | ICD-10-CM | POA: Insufficient documentation

## 2017-09-14 DIAGNOSIS — M47816 Spondylosis without myelopathy or radiculopathy, lumbar region: Secondary | ICD-10-CM | POA: Diagnosis not present

## 2017-09-14 DIAGNOSIS — F329 Major depressive disorder, single episode, unspecified: Secondary | ICD-10-CM | POA: Diagnosis not present

## 2017-09-14 DIAGNOSIS — Z79899 Other long term (current) drug therapy: Secondary | ICD-10-CM | POA: Insufficient documentation

## 2017-09-14 DIAGNOSIS — M1990 Unspecified osteoarthritis, unspecified site: Secondary | ICD-10-CM | POA: Diagnosis not present

## 2017-09-14 DIAGNOSIS — M47812 Spondylosis without myelopathy or radiculopathy, cervical region: Secondary | ICD-10-CM | POA: Insufficient documentation

## 2017-09-14 DIAGNOSIS — M4726 Other spondylosis with radiculopathy, lumbar region: Secondary | ICD-10-CM | POA: Diagnosis not present

## 2017-09-14 DIAGNOSIS — I10 Essential (primary) hypertension: Secondary | ICD-10-CM | POA: Diagnosis not present

## 2017-09-14 DIAGNOSIS — G4700 Insomnia, unspecified: Secondary | ICD-10-CM | POA: Insufficient documentation

## 2017-09-14 DIAGNOSIS — K219 Gastro-esophageal reflux disease without esophagitis: Secondary | ICD-10-CM | POA: Diagnosis not present

## 2017-09-14 DIAGNOSIS — M5136 Other intervertebral disc degeneration, lumbar region: Secondary | ICD-10-CM

## 2017-09-14 DIAGNOSIS — G894 Chronic pain syndrome: Secondary | ICD-10-CM | POA: Insufficient documentation

## 2017-09-14 DIAGNOSIS — M549 Dorsalgia, unspecified: Secondary | ICD-10-CM | POA: Diagnosis present

## 2017-09-14 DIAGNOSIS — Z6838 Body mass index (BMI) 38.0-38.9, adult: Secondary | ICD-10-CM | POA: Insufficient documentation

## 2017-09-14 DIAGNOSIS — E882 Lipomatosis, not elsewhere classified: Secondary | ICD-10-CM | POA: Diagnosis not present

## 2017-09-14 DIAGNOSIS — M810 Age-related osteoporosis without current pathological fracture: Secondary | ICD-10-CM | POA: Diagnosis not present

## 2017-09-14 MED ORDER — ORPHENADRINE CITRATE 30 MG/ML IJ SOLN
60.0000 mg | Freq: Once | INTRAMUSCULAR | Status: AC
  Administered 2017-09-14: 60 mg via INTRAMUSCULAR
  Filled 2017-09-14: qty 2

## 2017-09-14 MED ORDER — KETOROLAC TROMETHAMINE 30 MG/ML IJ SOLN
30.0000 mg | Freq: Once | INTRAMUSCULAR | Status: AC
  Administered 2017-09-14: 30 mg via INTRAMUSCULAR
  Filled 2017-09-14: qty 1

## 2017-09-14 NOTE — Progress Notes (Signed)
Patient's Name: Connie [name]  MRN: [id number]  Referring Provider: Tracie Harrier, MD  DOB: January 04, 1956  PCP: Tracie Harrier, MD  DOS: 09/14/2017  Note by: [name], MD  Service setting: Ambulatory outpatient  Specialty: Interventional Pain Management  Location: [facility] (AMB) Pain Management Facility    Patient type: Established   Primary Reason(s) for Visit: Encounter for post-procedure evaluation of chronic illness with mild to moderate exacerbation CC: Neck Pain (bilateral) and Back Pain (lower left is worse )  HPI  Connie [name] is a 62 y.o. year old, female patient, who comes today for a post-procedure evaluation. She has Degenerative joint disease of cervical and lumbar spine; Lumbar spondylosis; History of lumbar fusion; Lumbar degenerative disc disease; Fibromyalgia; Cervicalgia; DDD (degenerative disc disease), cervical; Chronic pain syndrome; Major depressive disorder; Spinal cord stimulator status; Chiari malformation type I ([place]); Adiposis dolorosa; Acquired spondylolisthesis; Chronic constipation; Chronic midline low back pain; Chronic pain; Daytime somnolence; Delusions of parasitosis ([place]); Diastolic dysfunction; Dry eyes, bilateral; Dysphagia, oropharyngeal phase; Edema; GERD without esophagitis; Generalized osteoarthritis of multiple sites; Fluid collection at surgical site; Fatty liver; Fatigue; Essential hypertension; Internal hemorrhoid; Insomnia; Hypothyroidism; H/O gastric bypass; Seasonal allergies; Rotator cuff arthropathy of both shoulders; Osteoporosis; Obesity, morbid ([place]); Nonunion of fracture; Morgellons disease; Medication-induced delirium, acute, hyperactive ([place]); Lumbar pseudoarthrosis; Leg cramps; Iron deficiency; Joint pain; Spinal stenosis of lumbar region without neurogenic claudication; Arthritis; Complete tear of right rotator cuff; OA (osteoarthritis); Rotator cuff tendinitis, right; Lumbar post-laminectomy syndrome; Lumbar radiculopathy; Cervical  radiculopathy; Bilateral hand pain; Candidiasis of skin and nails; CRP elevated; Jaw pain, non-TMJ; Lumbosacral radiculitis; Lumbosacral spondylosis without myelopathy; Pain in the coccyx; Menopausal symptom; Temporal headache; Stiffness of shoulder joint; SOBOE (shortness of breath on exertion); Screening for osteoporosis; and Vitamin D deficiency, unspecified on their problem list. Her primarily concern today is the Neck Pain (bilateral) and Back Pain (lower left is worse )  Pain Assessment: Location: Left, Right(lower back, left is worse) Neck(back) Radiating: back pain is going into left hip or buttocks Onset: More than a month ago Duration: Chronic pain Quality: Sharp, Discomfort, Jabbing Severity: 2 /10 (subjective, self-reported pain score)  Note: Reported level is compatible with observation.                         When using our objective Pain Scale, levels between 6 and 10/10 are said to belong in an emergency room, as it progressively worsens from a 6/10, described as severely limiting, requiring emergency care not usually available at an outpatient pain management facility. At a 6/10 level, communication becomes difficult and requires great effort. Assistance to reach the emergency department may be required. Facial flushing and profuse sweating along with potentially dangerous increases in heart rate and blood pressure will be evident. Effect on ADL: activities really aggravate back pain and is using a walker for stability  Timing: Constant Modifying factors: procedure did help the neck.  rest or lying down BP: 127/67  HR: 65  Connie [name] comes in today for post-procedure evaluation after the treatment done on 08/20/2017.  Further details on both, my assessment(s), as well as the proposed treatment plan, please see below.  Post-Procedure Assessment  08/19/2017 Procedure: Bilateral C4, 5, 6, 7 facet medial branch nerve block Pre-procedure pain score:  0/10 Post-procedure pain score:  0/10         Influential Factors: BMI: 38.73 kg/m Intra-procedural challenges: None observed.         Assessment challenges: None detected.  Reported side-effects: None.        Post-procedural adverse reactions or complications: None reported         Sedation: Please see nurses note. When no sedatives are used, the analgesic levels obtained are directly associated to the effectiveness of the local anesthetics. However, when sedation is provided, the level of analgesia obtained during the initial 1 hour following the intervention, is believed to be the result of a combination of factors. These factors may include, but are not limited to: 1. The effectiveness of the local anesthetics used. 2. The effects of the analgesic(s) and/or anxiolytic(s) used. 3. The degree of discomfort experienced by the patient at the time of the procedure. 4. The patients ability and reliability in recalling and recording the events. 5. The presence and influence of possible secondary gains and/or psychosocial factors. Reported result: Relief experienced during the 1st hour after the procedure: 100 % (Ultra-Short Term Relief)            Interpretative annotation: Clinically appropriate result. Analgesia during this period is likely to be Local Anesthetic and/or IV Sedative (Analgesic/Anxiolytic) related.          Effects of local anesthetic: The analgesic effects attained during this period are directly associated to the localized infiltration of local anesthetics and therefore cary significant diagnostic value as to the etiological location, or anatomical origin, of the pain. Expected duration of relief is directly dependent on the pharmacodynamics of the local anesthetic used. Long-acting (4-6 hours) anesthetics used.  Reported result: Relief during the next 4 to 6 hour after the procedure: 80 % (Short-Term Relief)            Interpretative annotation: Clinically appropriate result. Analgesia during this  period is likely to be Local Anesthetic-related.          Long-term benefit: Defined as the period of time past the expected duration of local anesthetics (1 hour for short-acting and 4-6 hours for long-acting). With the possible exception of prolonged sympathetic blockade from the local anesthetics, benefits during this period are typically attributed to, or associated with, other factors such as analgesic sensory neuropraxia, antiinflammatory effects, or beneficial biochemical changes provided by agents other than the local anesthetics.  Reported result: Extended relief following procedure: 50 %(patient states that pain is much better but does she does have a stiff neck,.) (Long-Term Relief)            Interpretative annotation: Clinically appropriate result. Good relief. No permanent benefit expected. Inflammation plays a part in the etiology to the pain.          Current benefits: Defined as reported results that persistent at this point in time.   Analgesia: 50-75 % Connie [name] reports improvement of arthralgia. Function: Somewhat improved ROM: Somewhat improved Interpretative annotation: Ongoing benefit. Therapeutic benefit observed. Effective diagnostic intervention.          Interpretation: Results would suggest a successful diagnostic and therapeutic intervention.                  Plan:  Please see "Plan of Care" for details.                Laboratory Chemistry  Inflammation Markers (CRP: Acute Phase) (ESR: Chronic Phase) No results found for: CRP, ESRSEDRATE, LATICACIDVEN                       Rheumatology Markers No results found for: RF, ANA, LABURIC, URICUR, LYMEIGGIGMAB, LYMEABIGMQN  Renal Function Markers Lab Results  Component Value Date   BUN 18 06/23/2017   CREATININE 0.60 06/23/2017   GFRAA >60 06/23/2017   GFRNONAA >60 06/23/2017                              Hepatic Function Markers Lab Results  Component Value Date   AST 20 06/23/2017   ALT  9 (L) 06/23/2017   ALBUMIN 3.6 06/23/2017   ALKPHOS 88 06/23/2017                        Electrolytes Lab Results  Component Value Date   NA 135 06/23/2017   K 4.9 06/23/2017   CL 102 06/23/2017   CALCIUM 8.5 (L) 06/23/2017                        Neuropathy Markers No results found for: VITAMINB12, FOLATE, HGBA1C, HIV                      Bone Pathology Markers No results found for: VD25OH, VD125OH2TOT, [id number], IR4854OE7, 25OHVITD1, 25OHVITD2, 25OHVITD3, TESTOFREE, TESTOSTERONE                       Coagulation Parameters Lab Results  Component Value Date   PLT 211 06/23/2017                        Cardiovascular Markers Lab Results  Component Value Date   HGB 11.8 (L) 06/23/2017   HCT 36.1 06/23/2017                         CA Markers No results found for: CEA, CA125, LABCA2                      Note: Lab results reviewed.  Recent Diagnostic Imaging Results  DG C-Arm 1-60 Min-No Report Fluoroscopy was utilized by the requesting physician.  No radiographic  interpretation.   Complexity Note: Imaging results reviewed. Results shared with Connie [name], using Layman's terms.                         Meds   Current Outpatient Medications:  .  alendronate (FOSAMAX) 70 MG tablet, Take 70 mg by mouth once a week., Disp: , Rfl:  .  buPROPion (WELLBUTRIN XL) 300 MG 24 hr tablet, Take 1 tablet (300 mg total) by mouth daily., Disp: 90 tablet, Rfl: 1 .  gabapentin (NEURONTIN) 300 MG capsule, Take 1 capsule (300 mg total) by mouth 2 (two) times daily., Disp: 60 capsule, Rfl: 2 .  glycopyrrolate (ROBINUL) 1 MG tablet, Take 1 mg by mouth 2 (two) times daily. , Disp: , Rfl: 3 .  hydrOXYzine (ATARAX/VISTARIL) 10 MG tablet, Take 1-2 tablets (10-20 mg total) by mouth at bedtime as needed for anxiety., Disp: 180 tablet, Rfl: 1 .  levorphanol (LEVODROMORAN) 2 MG tablet, 3 mg in am, 2 mg in afternoon, and 54m evening For chronic pain To last 30 days from fill date Fill on or  after:08/11/17, 09/09/17, 10/09/17, Disp: 105 tablet, Rfl: 0 .  levothyroxine (SYNTHROID, LEVOTHROID) 25 MCG tablet, Take 25 mcg by mouth once daily before breakfast, Disp: , Rfl: 0 .  lipase/protease/amylase (CREON) 36000 UNITS CPEP capsule, Take 36,000 Units  by mouth 3 (three) times daily with meals., Disp: , Rfl:  .  metoprolol succinate (TOPROL-XL) 100 MG 24 hr tablet, Take 100 mg by mouth once daily in the evening, Disp: , Rfl: 0 .  montelukast (SINGULAIR) 10 MG tablet, Take by mouth., Disp: , Rfl:  .  ondansetron (ZOFRAN-ODT) 8 MG disintegrating tablet, Take 8 mg by mouth daily as needed for nausea or vomiting., Disp: , Rfl:  .  pantoprazole (PROTONIX) 40 MG tablet, Take 40 mg by mouth once daily in the evening, Disp: , Rfl:  .  predniSONE (DELTASONE) 20 MG tablet, Take 3 tabs (60 mg ) in the morning , 30 days, Disp: , Rfl:  .  traZODone (DESYREL) 100 MG tablet, Take 1-2 tablets (100-200 mg total) by mouth at bedtime as needed for sleep., Disp: 180 tablet, Rfl: 1 .  vitamin B-12 (CYANOCOBALAMIN) 1000 MCG tablet, Take 1,000 mcg by mouth daily., Disp: , Rfl:  .  Vitamin D, Ergocalciferol, (DRISDOL) 50000 units CAPS capsule, Take one capsule once a week for 12 weeks, Disp: , Rfl:  .  ibuprofen (ADVIL,MOTRIN) 200 MG tablet, Take 400 mg by mouth daily as needed for headache or moderate pain., Disp: , Rfl:  .  loratadine (CLARITIN) 10 MG tablet, Take 10 mgs by mouth once daily in the evening, Disp: , Rfl:   ROS  Constitutional: Denies any fever or chills Gastrointestinal: No reported hemesis, hematochezia, vomiting, or acute GI distress Musculoskeletal: Denies any acute onset joint swelling, redness, loss of ROM, or weakness Neurological: No reported episodes of acute onset apraxia, aphasia, dysarthria, agnosia, amnesia, paralysis, loss of coordination, or loss of consciousness  Allergies  Connie [name] is allergic to other and tape.  PFSH  Drug: Connie [name]  reports that she does not use  drugs. Alcohol:  reports that she does not drink alcohol. Tobacco:  reports that she quit smoking about 10 years ago. Her smoking use included cigarettes. She smoked 0.50 packs per day. She has never used smokeless tobacco. Medical:  has a past medical history of Anemia, Anxiety, Arthritis, Cervical spondylosis with myelopathy (2018), Chronic pain (2019), DDD (degenerative disc disease), lumbar, Depression, Displacement of cervical intervertebral disc (2018), Fibromyalgia, GERD (gastroesophageal reflux disease), Heart murmur, Hypertension, Hypothyroidism, Lumbar post-laminectomy syndrome (2018), Lumbosacral radiculitis (2018), Osteoporosis, Other long term (current) drug therapy (2019), Pain in the coccyx (2018), Spondylolisthesis (2018), Spondylosis of lumbosacral region without myelopathy or radiculopathy (2018), and Spondylosis with myelopathy, lumbar region (2018). Surgical: Connie [name]  has a past surgical history that includes Cesarean section; Gastric bypass (2004); Lumbar fusion (2016); Spinal cord stimulator implant (2018); Cholecystectomy; Appendectomy; Tonsillectomy; Shoulder arthroscopy with rotator cuff repair (Right, 2008); Joint replacement (Bilateral, 2005); Abdominal hysterectomy (2000); epidural injection (2018); arm surgery (2002); Breast biopsy (Left, 2005); and Colonoscopy with propofol (N/A, 08/25/2017). Family: family history includes Anxiety disorder in her mother; Breast cancer in her cousin, maternal aunt, and mother; Cancer in her brother, father, and mother; Depression in her mother; Hypertension in her father; Kidney disease in her mother.  Constitutional Exam  General appearance: Well nourished, well developed, and well hydrated. In no apparent acute distress Vitals:   09/14/17 1347  BP: 127/67  Pulse: 65  Resp: 16  Temp: 98.7 F (37.1 C)  TempSrc: Oral  SpO2: 97%  Weight: 205 lb (93 kg)  Height: _0  (1.549 m)   BMI Assessment: Estimated body mass index is 38.73  kg/m as calculated from the following:   Height as of this encounter: 5'  1" (1.549 m).   Weight as of this encounter: 205 lb (93 kg).  BMI interpretation table: BMI level Category Range association with higher incidence of chronic pain  <18 kg/m2 Underweight   18.5-24.9 kg/m2 Ideal body weight   25-29.9 kg/m2 Overweight Increased incidence by 20%  30-34.9 kg/m2 Obese (Class I) Increased incidence by 68%  35-39.9 kg/m2 Severe obesity (Class II) Increased incidence by 136%  >40 kg/m2 Extreme obesity (Class III) Increased incidence by 254%   Patient's current BMI Ideal Body weight  Body mass index is 38.73 kg/m. Ideal body weight: 47.8 kg (105 lb 6.1 oz) Adjusted ideal body weight: 65.9 kg (145 lb 3.6 oz)   BMI Readings from Last 4 Encounters:  09/14/17 38.73 kg/m  08/25/17 38.55 kg/m  08/19/17 38.92 kg/m  08/04/17 38.55 kg/m   Wt Readings from Last 4 Encounters:  09/14/17 205 lb (93 kg)  08/25/17 204 lb (92.5 kg)  08/19/17 206 lb (93.4 kg)  08/04/17 204 lb (92.5 kg)  Psych/Mental status: Alert, oriented x 3 (person, place, & time)       Eyes: PERLA Respiratory: No evidence of acute respiratory distress  Cervical Spine Area Exam  Skin & Axial Inspection: No masses, redness, edema, swelling, or associated skin lesions Alignment: Symmetrical Functional ROM: Improved after treatment      Stability: No instability detected Muscle Tone/Strength: Functionally intact. No obvious neuro-muscular anomalies detected. Sensory (Neurological): Improved Palpation: No palpable anomalies              Upper Extremity (UE) Exam    Side: Right upper extremity  Side: Left upper extremity  Skin & Extremity Inspection: Skin color, temperature, and hair growth are WNL. No peripheral edema or cyanosis. No masses, redness, swelling, asymmetry, or associated skin lesions. No contractures.  Skin & Extremity Inspection: Skin color, temperature, and hair growth are WNL. No peripheral edema or  cyanosis. No masses, redness, swelling, asymmetry, or associated skin lesions. No contractures.  Functional ROM: Unrestricted ROM          Functional ROM: Unrestricted ROM          Muscle Tone/Strength: Functionally intact. No obvious neuro-muscular anomalies detected.  Muscle Tone/Strength: Functionally intact. No obvious neuro-muscular anomalies detected.  Sensory (Neurological): Unimpaired          Sensory (Neurological): Unimpaired          Palpation: No palpable anomalies              Palpation: No palpable anomalies              Provocative Test(s):  Phalen's test: deferred Tinel's test: deferred Apley scratch test (Rotator Cuff ROM): deferred  Provocative Test(s):  Phalen's test: deferred Tinel's test: deferred Apley scratch test (Rotator Cuff ROM): deferred   Thoracic Spine Area Exam  Skin & Axial Inspection: No masses, redness, or swelling Alignment: Symmetrical Functional ROM: Unrestricted ROM Stability: No instability detected Muscle Tone/Strength: Functionally intact. No obvious neuro-muscular anomalies detected. Sensory (Neurological): Unimpaired Muscle strength & Tone: No palpable anomalies  Lumbar Spine Area Exam  Skin & Axial Inspection: No masses, redness, or swelling Alignment: Symmetrical Functional ROM: Unrestricted ROM       Stability: No instability detected Muscle Tone/Strength: Functionally intact. No obvious neuro-muscular anomalies detected. Sensory (Neurological): Unimpaired Palpation: No palpable anomalies       Provocative Tests: Lumbar Hyperextension/rotation test: deferred today       Lumbar quadrant test (Kemp's test): deferred today       Lumbar Lateral  bending test: deferred today       Patrick's Maneuver: deferred today                   FABER test: deferred today       Thigh-thrust test: deferred today       S-I compression test: deferred today       S-I distraction test: deferred today        Gait & Posture Assessment  Ambulation:  Unassisted Gait: Relatively normal for age and body habitus Posture: WNL   Lower Extremity Exam    Side: Right lower extremity  Side: Left lower extremity  Stability: No instability observed          Stability: No instability observed          Skin & Extremity Inspection: Skin color, temperature, and hair growth are WNL. No peripheral edema or cyanosis. No masses, redness, swelling, asymmetry, or associated skin lesions. No contractures.  Skin & Extremity Inspection: Skin color, temperature, and hair growth are WNL. No peripheral edema or cyanosis. No masses, redness, swelling, asymmetry, or associated skin lesions. No contractures.  Functional ROM: Unrestricted ROM                  Functional ROM: Unrestricted ROM                  Muscle Tone/Strength: Functionally intact. No obvious neuro-muscular anomalies detected.  Muscle Tone/Strength: Functionally intact. No obvious neuro-muscular anomalies detected.  Sensory (Neurological): Unimpaired  Sensory (Neurological): Unimpaired  Palpation: No palpable anomalies  Palpation: No palpable anomalies   Assessment  Primary Diagnosis & Pertinent Problem List: The primary encounter diagnosis was Spondylosis of cervical region without myelopathy or radiculopathy. Diagnoses of Cervical facet joint syndrome (L>R, C4/5/6/7), Chronic pain syndrome, History of lumbar fusion, Lumbar degenerative disc disease, Lumbar spondylosis, Fibromyalgia, and Cervicalgia were also pertinent to this visit.  Status Diagnosis  Responding Responding Controlled 1. Spondylosis of cervical region without myelopathy or radiculopathy   2. Cervical facet joint syndrome (L>R, C4/5/6/7)   3. Chronic pain syndrome   4. History of lumbar fusion   5. Lumbar degenerative disc disease   6. Lumbar spondylosis   7. Fibromyalgia   8. Cervicalgia      General Recommendations: The pain condition that the patient suffers from is best treated with a multidisciplinary approach that  involves an increase in physical activity to prevent de-conditioning and worsening of the pain cycle, as well as psychological counseling (formal and/or informal) to address the co-morbid psychological affects of pain. Treatment will often involve judicious use of pain medications and interventional procedures to decrease the pain, allowing the patient to participate in the physical activity that will ultimately produce long-lasting pain reductions. The goal of the multidisciplinary approach is to return the patient to a higher level of overall function and to restore their ability to perform activities of daily living.  62 year old female with chronic pain syndrome secondary to lumbar degenerative disc disease, lumbar radiculopathy, lumbar spondylosis status post L3-L5 lumbar fusion who presents with worsening neck pain that radiates into bilateral shoulders and bilateral upper extremities. Upon chart review from neurosurgery note, patient's MRI from 2016 showed mild compression at C5-C6 and stenosis at C6/7.  Patient's CT myelogram was reviewed with her today which reveals left disc osteophyte complex with mild central canal stenosis at C5-6, facet hypertrophy and cervical spondylosis at C4/5/6/7, left greater than right.  Patient does have pain consistent with cervical facet syndrome  which is worse on the left.  Patient endorses pain in her left neck, shoulder, periscapular region which is worse with cervical extension and lateral rotation.  Patient follows up today after bilateral C4, 5, 6, 7  cervical facet medial branch nerve block performed on 08/19/2017.  Patient endorses improvement in her neck and shoulder pain symptoms as well as improvement in her lateral cervical range of motion.  She describes pain relief that allows her to sleep for an extended period of time during the night without waking up as often in pain.  Patient is endorsing acute on chronic hip and low back pain.  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  Patient is endorsing left hip pain as well as left lumbar pain.  Patient's kidney function within normal limits.  We discussed intramuscular Norflex and Toradol as below.  Patient will follow-up for her originally scheduled appointment in early July for medication refill.  At that time we can discuss repeating bilateral C4, 5, 6, 7 facet medial branch nerve block for her cervical facet syndrome if she is having return of her axial neck and shoulder pain at that time.  Plan of Care  Pharmacotherapy (Medications Ordered): Meds ordered this encounter  Medications  . ketorolac (TORADOL) 30 MG/ML injection 30 mg  . orphenadrine (NORFLEX) injection 60 mg   Time Note: Greater than 50% of the 25 minute(s) of face-to-face time spent with Connie [name], was spent in counseling/coordination of care regarding: Connie [name] primary cause of pain, the treatment plan, treatment alternatives, the risks and possible complications of proposed treatment, medication side effects, going over the informed consent, the results, interpretation and significance of  her recent diagnostic interventional treatment(s) and realistic expectations.  Future Appointments  Date Time Provider [place]  09/17/2017 10:30 AM Ursula Alert, MD [facility] None  09/17/2017 11:00 AM Lubertha South, LCSW [facility] None  11/03/2017 12:00 PM [name], MD [facility] None    Primary Care Physician: Tracie Harrier, MD Location: [facility] Note by: [name], M.D Date: 09/14/2017; Time: 3:02 PM  Patient Instructions  Pain Management Discharge Instructions  General Discharge Instructions :  If you need to reach your doctor call: Monday-Friday 8:00 am - 4:00 pm at [phone] or toll free [phone].  After clinic hours [phone] to have operator reach doctor.  Bring all of your  medication bottles to all your appointments in the pain clinic.  To cancel or reschedule your appointment with Pain Management please remember to call 24 hours in advance to avoid a fee.  Refer to the educational materials which you have been given on: General Risks, I had my Procedure. Discharge Instructions, Post Sedation.  Post Procedure Instructions:  The drugs you were given will stay in your system until tomorrow, so for the next 24 hours you should not drive, make any legal decisions or drink any alcoholic beverages.  You may eat anything you prefer, but it is better to start with liquids then soups and crackers, and gradually work up to solid foods.  Please notify your doctor immediately if you have any unusual bleeding, trouble breathing or pain that is not related to your normal pain.  Depending on the type of procedure that was done, some parts of your body may feel week and/or numb.  This usually clears up by tonight or the next day.  Walk with  the use of an assistive device or accompanied by an adult for the 24 hours.  You may use ice on the affected area for the first 24 hours.  Put ice in a Ziploc bag and cover with a towel and place against area 15 minutes on 15 minutes off.  You may switch to heat after 24 hours.

## 2017-09-14 NOTE — Patient Instructions (Signed)

## 2017-09-14 NOTE — Progress Notes (Signed)
Safety precautions to be maintained throughout the outpatient stay will include: orient to surroundings, keep bed in low position, maintain call bell within reach at all times, provide assistance with transfer out of bed and ambulation.  

## 2017-09-17 ENCOUNTER — Encounter: Payer: Self-pay | Admitting: Emergency Medicine

## 2017-09-17 ENCOUNTER — Ambulatory Visit: Payer: 59 | Admitting: Psychiatry

## 2017-09-17 ENCOUNTER — Ambulatory Visit: Payer: 59 | Admitting: Licensed Clinical Social Worker

## 2017-09-17 ENCOUNTER — Emergency Department
Admission: EM | Admit: 2017-09-17 | Discharge: 2017-09-17 | Disposition: A | Discharge status: Home / Self Care | Payer: Managed Care, Other (non HMO) | Attending: Emergency Medicine | Admitting: Emergency Medicine

## 2017-09-17 DIAGNOSIS — Z96653 Presence of artificial knee joint, bilateral: Secondary | ICD-10-CM | POA: Diagnosis not present

## 2017-09-17 DIAGNOSIS — Z87891 Personal history of nicotine dependence: Secondary | ICD-10-CM | POA: Diagnosis not present

## 2017-09-17 DIAGNOSIS — I10 Essential (primary) hypertension: Secondary | ICD-10-CM | POA: Insufficient documentation

## 2017-09-17 DIAGNOSIS — M2550 Pain in unspecified joint: Secondary | ICD-10-CM | POA: Diagnosis present

## 2017-09-17 DIAGNOSIS — Z79899 Other long term (current) drug therapy: Secondary | ICD-10-CM | POA: Diagnosis not present

## 2017-09-17 DIAGNOSIS — E039 Hypothyroidism, unspecified: Secondary | ICD-10-CM | POA: Insufficient documentation

## 2017-09-17 MED ORDER — KETOROLAC TROMETHAMINE 60 MG/2ML IM SOLN
30.0000 mg | Freq: Once | INTRAMUSCULAR | Status: AC
  Administered 2017-09-17: 30 mg via INTRAMUSCULAR
  Filled 2017-09-17: qty 2

## 2017-09-17 MED ORDER — DEXAMETHASONE SODIUM PHOSPHATE 10 MG/ML IJ SOLN
10.0000 mg | Freq: Once | INTRAMUSCULAR | Status: AC
  Administered 2017-09-17: 10 mg via INTRAMUSCULAR
  Filled 2017-09-17: qty 1

## 2017-09-17 MED ORDER — PREDNISONE 10 MG (21) PO TBPK: ORAL_TABLET | ORAL | 0 refills | Status: DC

## 2017-09-17 NOTE — ED Provider Notes (Signed)
[facility] Emergency Department Provider Note  ____________________________________________  Time seen: Approximately 7:50 AM  I have reviewed the triage vital signs and the nursing notes.   HISTORY  Chief Complaint Joint Pain   HPI Frannie Shedrick Dalia is a 62 y.o. female with a history of fibromyalgia, DDD, chronic pain, osteoporosis, osteoarthritis, and polymyalgia rheumatica who presents for evaluation of multi-joint pain.  Patient reports that her symptoms are ongoing for several months however they felt worse yesterday evening.  She is complaining of pain in bilateral thumbs, bilateral wrists, bilateral shoulders, and jaw and neck.  She reports that the pain feels like "inflammation".  She reports that the pain usually responds to steroids.  She reports that her doctor has been weaning her off of steroids and at this time she is only on 5 mg daily of prednisone.  She denies any fever or trauma, she denies any swelling or redness of her joints.  She reports that the pain is constant and moderate in intensity.  She reports that her joints feel very tight.  Patient reports that she was seen here 3 months ago with similar complaints and received decadron and toradol and that really helped her.  Past Medical History:  Diagnosis Date  . Anemia    in the past  . Anxiety   . Arthritis   . Cervical spondylosis with myelopathy 2018  . Chronic pain 2019  . DDD (degenerative disc disease), lumbar   . Depression   . Displacement of cervical intervertebral disc 2018  . Fibromyalgia   . GERD (gastroesophageal reflux disease)   . Heart murmur    not treated or being followed  . Hypertension   . Hypothyroidism   . Lumbar post-laminectomy syndrome 2018  . Lumbosacral radiculitis 2018  . Osteoporosis   . Other long term (current) drug therapy 2019   from pain management  . Pain in the coccyx 2018   disorder of sacrum  . Spondylolisthesis 2018  . Spondylosis of  lumbosacral region without myelopathy or radiculopathy 2018  . Spondylosis with myelopathy, lumbar region 2018    Patient Active Problem List   Diagnosis Date Noted  . Vitamin D deficiency, unspecified 07/22/2017  . Candidiasis of skin and nails 07/13/2017  . Menopausal symptom 07/13/2017  . Bilateral hand pain 06/26/2017  . CRP elevated 06/26/2017  . Jaw pain, non-TMJ 06/26/2017  . Temporal headache 06/26/2017  . Stiffness of shoulder joint 06/26/2017  . Screening for osteoporosis 06/26/2017  . SOBOE (shortness of breath on exertion) 06/09/2017  . Joint pain 05/20/2017  . Arthritis 05/20/2017  . History of lumbar fusion 04/23/2017  . Lumbar degenerative disc disease 04/23/2017  . Fibromyalgia 04/23/2017  . Cervicalgia 04/23/2017  . DDD (degenerative disc disease), cervical 04/23/2017  . Chronic pain syndrome 04/23/2017  . Major depressive disorder 04/23/2017  . Spinal cord stimulator status 04/23/2017  . Complete tear of right rotator cuff 04/17/2017  . Rotator cuff tendinitis, right 04/17/2017  . Rotator cuff arthropathy of both shoulders 04/12/2017  . Degenerative joint disease of cervical and lumbar spine 03/17/2017  . Generalized osteoarthritis of multiple sites 03/17/2017  . Seasonal allergies 03/17/2017  . GERD without esophagitis 01/18/2017  . Fluid collection at surgical site 01/18/2017  . Hypothyroidism 01/18/2017  . Fatty liver 12/08/2016  . Osteoporosis 09/30/2016  . Pain in the coccyx 08/19/2016  . Delusions of parasitosis ([place]) 05/27/2016  . Essential hypertension 04/09/2016  . Chiari malformation type I ([place]) 03/24/2016  . Obesity,  morbid ([place]) 03/24/2016  . Lumbosacral spondylosis without myelopathy 09/17/2015  . Chronic midline low back pain 03/28/2015  . Lumbosacral radiculitis 03/05/2015  . 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 Medication-induced delirium, acute, hyperactive ([place]) 11/17/2014  . Fatigue 05/08/2014  . Lumbar post-laminectomy syndrome  05/08/2014  . Lumbar radiculopathy 05/08/2014  . Cervical radiculopathy 05/08/2014  . H/O gastric bypass 03/03/2014  . Dysphagia, oropharyngeal phase 05/18/2013  . Acquired spondylolisthesis 08/23/2012  . Nonunion of fracture 08/23/2012  . Spinal stenosis of lumbar region without neurogenic claudication 08/23/2012  . Chronic constipation 07/26/2012  . Dry eyes, bilateral 07/26/2012  . Leg cramps 07/26/2012  . Diastolic dysfunction 02/11/5101  . Daytime somnolence 06/02/2012  . Insomnia 05/11/2012  . Lumbar spondylosis 04/06/2012  . Lumbar pseudoarthrosis 04/06/2012  . Iron deficiency 01/14/2012  . Adiposis dolorosa 12/16/2011  . Chronic pain 12/16/2011  . Edema 12/16/2011  . Internal hemorrhoid 12/16/2011  . OA (osteoarthritis) 12/16/2011    Past Surgical History:  Procedure Laterality Date  . ABDOMINAL HYSTERECTOMY  2000  . APPENDECTOMY    . arm surgery  2002  . BREAST BIOPSY Left 2005   benign  . CESAREAN SECTION     x 2  . CHOLECYSTECTOMY    . COLONOSCOPY WITH PROPOFOL N/A 08/25/2017   Procedure: COLONOSCOPY WITH PROPOFOL;  Surgeon: Toledo, Benay Pike, MD;  Location: [facility] ENDOSCOPY;  Service: Gastroenterology;  Laterality: N/A;  . epidural injection  2018   steroids  . GASTRIC BYPASS  2004  . JOINT REPLACEMENT Bilateral 2005   knees  . LUMBAR FUSION  2016   x 2  . SHOULDER ARTHROSCOPY WITH ROTATOR CUFF REPAIR Right 2008   x 3  . SPINAL CORD STIMULATOR IMPLANT  2018   [facility] scientific  . TONSILLECTOMY      Prior to Admission medications   Medication Sig Start Date End Date Taking? Authorizing Provider  alendronate (FOSAMAX) 70 MG tablet Take 70 mg by mouth once a week. Every Sunday 08/25/17  Yes [provider]  buPROPion (WELLBUTRIN XL) 300 MG 24 hr tablet Take 1 tablet (300 mg total) by mouth daily. 08/06/17  Yes Ursula Alert, MD  celecoxib (CELEBREX) 200 MG capsule Take 200 mg by mouth daily.   Yes [provider]  gabapentin (NEURONTIN)  300 MG capsule Take 1 capsule (300 mg total) by mouth 2 (two) times daily. 08/05/17  Yes [name], MD  glycopyrrolate (ROBINUL) 1 MG tablet Take 1 mg by mouth 2 (two) times daily.  04/12/17  Yes [provider]  ibuprofen (ADVIL,MOTRIN) 200 MG tablet Take 400 mg by mouth daily as needed for headache or moderate pain.   Yes [provider]  levorphanol (LEVODROMORAN) 2 MG tablet 3 mg in am, 2 mg in afternoon, and 2mg  evening For chronic pain To last 30 days from fill date Fill on or after:08/11/17, 09/09/17, 10/09/17 08/04/17  Yes [name], MD  levothyroxine (SYNTHROID, LEVOTHROID) 25 MCG tablet Take 25 mcg by mouth once daily before breakfast 04/06/17  Yes [provider]  lipase/protease/amylase (CREON) 36000 UNITS CPEP capsule Take 36,000 Units by mouth 3 (three) times daily with meals.   Yes [provider]  loratadine (CLARITIN) 10 MG tablet Take 10 mg by mouth daily as needed for allergies.   Yes [provider]  metoprolol succinate (TOPROL-XL) 100 MG 24 hr tablet Take 100 mg by mouth once daily in the evening 04/14/17  Yes [provider]  ondansetron (ZOFRAN-ODT) 8 MG disintegrating tablet Take 8  mg by mouth daily as needed for nausea or vomiting.   Yes [provider]  pantoprazole (PROTONIX) 40 MG tablet Take 40 mg by mouth once daily in the evening 01/22/17  Yes [provider]  predniSONE (DELTASONE) 5 MG tablet Take 5 mg by mouth daily. 06/26/17  Yes [provider]  traZODone (DESYREL) 100 MG tablet Take 1-2 tablets (100-200 mg total) by mouth at bedtime as needed for sleep. Patient taking differently: Take 50 mg by mouth at bedtime.  08/06/17  Yes Ursula Alert, MD  vitamin B-12 (CYANOCOBALAMIN) 1000 MCG tablet Take 1,000 mcg by mouth daily.   Yes [provider]  Vitamin D, Ergocalciferol, (DRISDOL) 50000 units CAPS capsule Take one capsule once a week for 12 weeks 07/22/17  Yes [provider]  hydrOXYzine (ATARAX/VISTARIL) 10 MG tablet Take 1-2 tablets (10-20 mg total) by mouth at bedtime as needed for anxiety. Patient not taking: Reported on 09/17/2017 08/06/17   Ursula Alert, MD  predniSONE (STERAPRED UNI-PAK 21 TAB) 10 MG (21) TBPK tablet Take 5 for 2 day, take 3 for 2 days, take 2 for 2 days, then resume your regular does of prednisone 09/17/17   Rudene Re, MD    Allergies Other and Tape  Family History  Problem Relation Age of Onset  . Kidney disease Mother   . Cancer Mother   . Anxiety disorder Mother   . Depression Mother   . Breast cancer Mother   . Cancer Father   . Hypertension Father   . Cancer Brother   . Breast cancer Maternal Aunt   . Breast cancer Cousin     Social History Social History   Tobacco Use  . Smoking status: Former Smoker    Packs/day: 0.50    Types: Cigarettes    Last attempt to quit: 05/21/2007    Years since quitting: 10.3  . Smokeless tobacco: Never Used  Substance Use Topics  . Alcohol use: No    Frequency: Never  . Drug use: No    Comment: managed by pain clinic    Review of Systems  Constitutional: Negative for fever. Eyes: Negative for visual changes. ENT: Negative for sore throat. Neck: No neck pain  Cardiovascular: Negative for chest pain. Respiratory: Negative for shortness of breath. Gastrointestinal: Negative for abdominal pain, vomiting or diarrhea. Genitourinary: Negative for dysuria. Musculoskeletal: Negative for back pain. + multi joint pain Skin: Negative for rash. Neurological: Negative for headaches, weakness or numbness. Psych: No SI or HI  ____________________________________________   PHYSICAL EXAM:  VITAL SIGNS: ED Triage Vitals [09/17/17 0727]  Enc Vitals Group     BP (!) 131/58     Pulse Rate 69     Resp 16     Temp 98.6 F (37 C)     Temp Source Oral     SpO2 96 %     Weight 205 lb (93 kg)     Height 5\' 1"  (1.549 m)     Head Circumference      Peak Flow       Pain Score 4     Pain Loc      Pain Edu?      Excl. in [place]?     Constitutional: Alert and oriented. Well appearing and in no apparent distress. HEENT:      Head: Normocephalic and atraumatic.         Eyes: Conjunctivae are normal. Sclera is non-icteric.       Mouth/Throat: Mucous membranes  are moist.       Neck: Supple with no signs of meningismus. Cardiovascular: Regular rate and rhythm. No murmurs, gallops, or rubs. 2+ symmetrical distal pulses are present in all extremities. No JVD. Respiratory: Normal respiratory effort. Lungs are clear to auscultation bilaterally. No wheezes, crackles, or rhonchi.  Musculoskeletal: Nontender with normal range of motion in all extremities. No edema, cyanosis, or erythema of extremities. Neurologic: Normal speech and language. Face is symmetric. Moving all extremities. No gross focal neurologic deficits are appreciated. Skin: Skin is warm, dry and intact. No rash noted. Psychiatric: Mood and affect are normal. Speech and behavior are normal.  ____________________________________________   LABS (all labs ordered are listed, but only abnormal results are displayed)  Labs Reviewed - No data to display ____________________________________________  EKG  none  ____________________________________________  RADIOLOGY  none  ____________________________________________   PROCEDURES  Procedure(s) performed: None Procedures Critical Care performed:  None ____________________________________________   INITIAL IMPRESSION / ASSESSMENT AND PLAN / ED COURSE   62 y.o. female with a history of fibromyalgia, DDD, chronic pain, osteoporosis, osteoarthritis, and polymyalgia rheumatica who presents for evaluation of multi-joint pain.  This is a chronic problem for patient.  She does seem to get relief when she receives steroids and anti-inflammatories.  I will provide patient with a dose of Decadron and Toradol as this seemed to help when she was here 3  months ago.  I will also send her home on a booster of steroids.  At this time there is no clinical signs or symptoms of inflammatory or septic joint, there is no evidence of cellulitis, all her joints look normal on exam with full range of motion, no swelling or erythema.  Patient is requesting a referral to ENT because she says she has had a sore throat for several months which has failed antibiotics and she has noted hoarseness of her voice.  She reports having a family history of head and neck cancer.  She is not a smoker.  She is in no respiratory distress with no stridor and normal oropharynx examination. Will refer to ENT.    _________________________ 9:37 AM on 09/17/2017 ----------------------------------------- Patient feels markedly improved after IM shot of Decadron and Toradol.  She is going to be discharged home on prednisone.  Recommend follow-up with rheumatologist.    As part of my medical decision making, I reviewed the following data within the [facility] notes reviewed and incorporated, Old chart reviewed, Notes from prior ED visits and [place] Controlled Substance Database    Pertinent labs & imaging results that were available during my care of the patient were reviewed by me and considered in my medical decision making (see chart for details).    ____________________________________________   FINAL CLINICAL IMPRESSION(S) / ED DIAGNOSES  Final diagnoses:  Polyarthralgia      NEW MEDICATIONS STARTED DURING THIS VISIT:  ED Discharge Orders        Ordered    predniSONE (STERAPRED UNI-PAK 21 TAB) 10 MG (21) TBPK tablet     09/17/17 0933       Note:  This document was prepared using Dragon voice recognition software and may include unintentional dictation errors.    Alfred Levins, [place], MD 09/17/17 [phone]

## 2017-09-17 NOTE — ED Triage Notes (Signed)
Patient presents to ED via POV from home with c/o joint pain "for a couple months", with pain getting progressively worse last night. Patient reports pain to neck and hands. Patient states "I just feel very painful and tight". A&O x4. Ambulatory to triage.

## 2017-09-25 ENCOUNTER — Other Ambulatory Visit: Payer: Self-pay

## 2017-09-25 ENCOUNTER — Ambulatory Visit (INDEPENDENT_AMBULATORY_CARE_PROVIDER_SITE_OTHER): Payer: 59 | Admitting: Psychiatry

## 2017-09-25 ENCOUNTER — Encounter: Payer: Self-pay | Admitting: Psychiatry

## 2017-09-25 VITALS — BP 123/71 | HR 65 | Temp 98.5°F | Wt 202.2 lb

## 2017-09-25 DIAGNOSIS — F5105 Insomnia due to other mental disorder: Secondary | ICD-10-CM

## 2017-09-25 DIAGNOSIS — F331 Major depressive disorder, recurrent, moderate: Secondary | ICD-10-CM

## 2017-09-25 NOTE — Progress Notes (Signed)
[facility] MD OP Progress Note  09/25/2017 12:33 PM Connie [name]  MRN:  [id number]  Chief Complaint: ' I am here for follow up.' Chief Complaint    Follow-up; Medication Refill     HPI: Connie [name] is a 62 year old Caucasian female, married, on SSD, lives in [place], has a history of depression, history of delusional parasitosis, chronic pain, fibromyalgia, hypothyroidism, hypertension, polyarthralgia, presented to the clinic today for a follow-up visit.  Patient has limited mobility and hence walks with the help of a walker, stooped over while walking.  She has chronic pain syndrome.  She today reports she continues to struggle with  her mother who has dementia as well as psychosis.  Patient reports she has been ruminating a lot about what her mother had done in the past.  She reports when her mother has acting out behavior now she continues to think about how her mother treated her when she was growing up.  She reports she has intrusive memories about that.  She reports it makes her very negative.  She reports sometimes it is hard to cope with.  Discussed with patient about distraction, relaxation techniques. Provided her education about dementia and behavioral problems due to dementia.  Discussed with her to continue psychotherapy with Ms. Peacock.   Patient reports sleep is good.  Patient reports the trazodone has been helpful.  She has not been using hydroxyzine for anxiety as much.  Patient continues to have good benefit from Wellbutrin.  She denies any side effects from the same.  Patient does report some muscular jerks all over her body.  She also has chronic pain.  Discussed with her about increasing her gabapentin or adding a muscle relaxant to see if that will help with that.  Patient reports she will reach out to her pain provider.     Visit Diagnosis:    ICD-10-CM   1. MDD (major depressive disorder), recurrent episode, moderate (HCC) F33.1   2. Insomnia due to mental disorder F51.05      Past Psychiatric History: Trials of Latuda, Wellbutrin.  I have reviewed past psychiatric history from my progress note on 08/06/2017.  Past Medical History:  Past Medical History:  Diagnosis Date  . Anemia    in the past  . Anxiety   . Arthritis   . Cervical spondylosis with myelopathy 2018  . Chronic pain 2019  . DDD (degenerative disc disease), lumbar   . Depression   . Displacement of cervical intervertebral disc 2018  . Fibromyalgia   . GERD (gastroesophageal reflux disease)   . Heart murmur    not treated or being followed  . Hypertension   . Hypothyroidism   . Lumbar post-laminectomy syndrome 2018  . Lumbosacral radiculitis 2018  . Osteoporosis   . Other long term (current) drug therapy 2019   from pain management  . Pain in the coccyx 2018   disorder of sacrum  . Spondylolisthesis 2018  . Spondylosis of lumbosacral region without myelopathy or radiculopathy 2018  . Spondylosis with myelopathy, lumbar region 2018    Past Surgical History:  Procedure Laterality Date  . ABDOMINAL HYSTERECTOMY  2000  . APPENDECTOMY    . arm surgery  2002  . BREAST BIOPSY Left 2005   benign  . CESAREAN SECTION     x 2  . CHOLECYSTECTOMY    . COLONOSCOPY WITH PROPOFOL N/A 08/25/2017   Procedure: COLONOSCOPY WITH PROPOFOL;  Surgeon: Toledo, Benay Pike, MD;  Location: [facility] ENDOSCOPY;  Service: Gastroenterology;  Laterality: N/A;  . epidural injection  2018   steroids  . GASTRIC BYPASS  2004  . JOINT REPLACEMENT Bilateral 2005   knees  . LUMBAR FUSION  2016   x 2  . SHOULDER ARTHROSCOPY WITH ROTATOR CUFF REPAIR Right 2008   x 3  . SPINAL CORD STIMULATOR IMPLANT  2018   [facility] scientific  . TONSILLECTOMY      Family Psychiatric History: Reviewed family psychiatric history from my progress note on 08/06/2017.  Family History:  Family History  Problem Relation Age of Onset  . Kidney disease Mother   . Cancer Mother   . Anxiety disorder Mother   . Depression Mother   .  Breast cancer Mother   . Cancer Father   . Hypertension Father   . Cancer Brother   . Breast cancer Maternal Aunt   . Breast cancer Cousin    Substance abuse history: Denies  Social History: Patient is married.  She moved from [place] to [place] in November 2018.  She lives with her husband in [place].  She has 2 children, a son and a daughter who are adults.  She used to work in the past but currently disabled due to chronic pain. Social History   Socioeconomic History  . Marital status: Married    Spouse name: brady   . Number of children: 2  . Years of education: Not on file  . Highest education level: High school graduate  Occupational History    Comment: disabled   Social Needs  . Financial resource strain: Somewhat hard  . Food insecurity:    Worry: Never true    Inability: Never true  . Transportation needs:    Medical: No    Non-medical: No  Tobacco Use  . Smoking status: Former Smoker    Packs/day: 0.50    Types: Cigarettes    Last attempt to quit: 05/21/2007    Years since quitting: 10.3  . Smokeless tobacco: Never Used  Substance and Sexual Activity  . Alcohol use: No    Frequency: Never  . Drug use: No    Comment: managed by pain clinic  . Sexual activity: Not Currently  Lifestyle  . Physical activity:    Days per week: 0 days    Minutes per session: 0 min  . Stress: Not on file  Relationships  . Social connections:    Talks on phone: Three times a week    Gets together: Once a week    Attends religious service: More than 4 times per year    Active member of club or organization: No    Attends meetings of clubs or organizations: Never    Relationship status: Married  Other Topics Concern  . Not on file  Social History Narrative  . Not on file    Allergies:  Allergies  Allergen Reactions  . Other Rash  . Tape Rash    Skin peels off and blisters.  Probably was clear plastic tape.    Metabolic Disorder Labs: No results found  for: HGBA1C, MPG No results found for: PROLACTIN No results found for: CHOL, TRIG, HDL, CHOLHDL, VLDL, LDLCALC No results found for: TSH  Therapeutic Level Labs: No results found for: LITHIUM No results found for: VALPROATE No components found for:  CBMZ  Current Medications: Current Outpatient Medications  Medication Sig Dispense Refill  . alendronate (FOSAMAX) 70 MG tablet Take 70 mg by mouth once a week. Every Sunday    . buPROPion (  WELLBUTRIN XL) 300 MG 24 hr tablet Take 1 tablet (300 mg total) by mouth daily. 90 tablet 1  . celecoxib (CELEBREX) 200 MG capsule Take 200 mg by mouth daily.    Marland Kitchen gabapentin (NEURONTIN) 300 MG capsule Take 1 capsule (300 mg total) by mouth 2 (two) times daily. 60 capsule 2  . glycopyrrolate (ROBINUL) 1 MG tablet Take 1 mg by mouth 2 (two) times daily.   3  . hydrOXYzine (ATARAX/VISTARIL) 10 MG tablet Take 1-2 tablets (10-20 mg total) by mouth at bedtime as needed for anxiety. 180 tablet 1  . ibuprofen (ADVIL,MOTRIN) 200 MG tablet Take 400 mg by mouth daily as needed for headache or moderate pain.    Marland Kitchen levorphanol (LEVODROMORAN) 2 MG tablet 3 mg in am, 2 mg in afternoon, and 2mg  evening For chronic pain To last 30 days from fill date Fill on or after:08/11/17, 09/09/17, 10/09/17 105 tablet 0  . levothyroxine (SYNTHROID, LEVOTHROID) 25 MCG tablet Take 25 mcg by mouth once daily before breakfast  0  . lipase/protease/amylase (CREON) 36000 UNITS CPEP capsule Take 36,000 Units by mouth 3 (three) times daily with meals.    Marland Kitchen loratadine (CLARITIN) 10 MG tablet Take 10 mg by mouth daily as needed for allergies.    . metoprolol succinate (TOPROL-XL) 100 MG 24 hr tablet Take 100 mg by mouth once daily in the evening  0  . ondansetron (ZOFRAN-ODT) 8 MG disintegrating tablet Take 8 mg by mouth daily as needed for nausea or vomiting.    . pantoprazole (PROTONIX) 40 MG tablet Take 40 mg by mouth once daily in the evening    . predniSONE (DELTASONE) 5 MG tablet Take 5 mg  by mouth daily.    . predniSONE (STERAPRED UNI-PAK 21 TAB) 10 MG (21) TBPK tablet Take 5 for 2 day, take 3 for 2 days, take 2 for 2 days, then resume your regular does of prednisone 21 tablet 0  . traZODone (DESYREL) 100 MG tablet Take 1-2 tablets (100-200 mg total) by mouth at bedtime as needed for sleep. (Patient taking differently: Take 50 mg by mouth at bedtime. ) 180 tablet 1  . vitamin B-12 (CYANOCOBALAMIN) 1000 MCG tablet Take 1,000 mcg by mouth daily.    . Vitamin D, Ergocalciferol, (DRISDOL) 50000 units CAPS capsule Take one capsule once a week for 12 weeks     No current facility-administered medications for this visit.      Musculoskeletal: Strength & Muscle Tone: within normal limits Gait & Station: walks with walker Patient leans: N/A  Psychiatric Specialty Exam: Review of Systems  Psychiatric/Behavioral: Positive for depression. The patient is nervous/anxious.   All other systems reviewed and are negative.   Blood pressure 123/71, pulse 65, temperature 98.5 F (36.9 C), temperature source Oral, weight 202 lb 3.2 oz (91.7 kg).Body mass index is 38.21 kg/m.  General Appearance: Casual  Eye Contact:  Fair  Speech:  Clear and Coherent  Volume:  Normal  Mood:  Anxious  Affect:  Congruent  Thought Process:  Goal Directed and Descriptions of Associations: Intact  Orientation:  Full (Time, Place, and Person)  Thought Content: Logical   Suicidal Thoughts:  No  Homicidal Thoughts:  No  Memory:  Immediate;   Fair Recent;   Fair Remote;   Fair  Judgement:  Fair  Insight:  Fair  Psychomotor Activity:  Normal  Concentration:  Concentration: Fair and Attention Span: Fair  Recall:  [employer] of Knowledge: Fair  Language: Fair  Akathisia:  No  Handed:  Right  AIMS (if indicated): na  Assets:  Communication Skills Desire for Improvement  ADL's:  Intact  Cognition: WNL  Sleep:  Fair   Screenings: PHQ2-9     Procedure visit from 08/19/2017 in [facility] Clinical Support from 08/04/2017 in [facility] Clinical Support from 06/04/2017 in [facility] Clinical Support from 05/11/2017 in [facility] Office Visit from 04/23/2017 in [facility]  PHQ-2 Total Score  0  0  0  5  0  PHQ-9 Total Score  -  -  -  13  -       Assessment and Plan: Connie [name] is a 62 year old Caucasian female who has a history of depression, chronic pain, GERD, hypothyroidism, hypertension, fibromyalgia, presented to the clinic today for a follow-up visit.  Patient today reports she continues to struggle with anxiety due to some psychosocial stressors.  She is the primary caregiver for her mother who has dementia as well as psychosis.  Her mother refuses to go to an assisted living facility or nursing home.  Patient hence has been struggling to help her mother out.  Discussed with patient to pursue more frequent psychotherapy visits.  Patient reports she is happy with her medication regimen at this time and does not want any changes.  Plan MDD Continue Wellbutrin XL 300 mg p.o. Daily. Continue CBT with Ms. Peacock  Insomnia Continue trazodone 100-200 mg p.o. nightly as needed Continue hydroxyzine 10-20 mg p.o. nightly as needed  Patient will continue to follow-up with her PMD as well as pain provider.  Follow-up in clinic in 2 months or sooner if needed.  More than 50 % of the time was spent for psychoeducation and supportive psychotherapy and care coordination.  This note was generated in part or whole with voice recognition software. Voice recognition is usually quite accurate but there are transcription errors that can and very often do occur. I apologize for any typographical errors that were not detected and corrected.        Ursula Alert, MD 09/25/2017, 12:33 PM

## 2017-09-28 ENCOUNTER — Ambulatory Visit (INDEPENDENT_AMBULATORY_CARE_PROVIDER_SITE_OTHER): Payer: 59 | Admitting: Licensed Clinical Social Worker

## 2017-09-28 DIAGNOSIS — F331 Major depressive disorder, recurrent, moderate: Secondary | ICD-10-CM

## 2017-10-14 NOTE — Discharge Instructions (Signed)

## 2017-10-15 ENCOUNTER — Other Ambulatory Visit: Payer: Self-pay

## 2017-10-21 ENCOUNTER — Ambulatory Visit
Admission: RE | Admit: 2017-10-21 | Discharge: 2017-10-21 | Disposition: A | Discharge status: Home / Self Care | Payer: Managed Care, Other (non HMO) | Source: Ambulatory Visit | Attending: Ophthalmology | Admitting: Ophthalmology

## 2017-10-21 ENCOUNTER — Ambulatory Visit: Payer: Managed Care, Other (non HMO) | Admitting: Anesthesiology

## 2017-10-21 ENCOUNTER — Encounter
Admission: RE | Disposition: A | Discharge status: Home / Self Care | Payer: Self-pay | Source: Ambulatory Visit | Attending: Ophthalmology

## 2017-10-21 DIAGNOSIS — Z87891 Personal history of nicotine dependence: Secondary | ICD-10-CM | POA: Diagnosis not present

## 2017-10-21 DIAGNOSIS — Z96653 Presence of artificial knee joint, bilateral: Secondary | ICD-10-CM | POA: Insufficient documentation

## 2017-10-21 DIAGNOSIS — H2511 Age-related nuclear cataract, right eye: Secondary | ICD-10-CM | POA: Diagnosis present

## 2017-10-21 DIAGNOSIS — I10 Essential (primary) hypertension: Secondary | ICD-10-CM | POA: Insufficient documentation

## 2017-10-21 DIAGNOSIS — K219 Gastro-esophageal reflux disease without esophagitis: Secondary | ICD-10-CM | POA: Diagnosis not present

## 2017-10-21 DIAGNOSIS — Z9884 Bariatric surgery status: Secondary | ICD-10-CM | POA: Diagnosis not present

## 2017-10-21 DIAGNOSIS — Z6839 Body mass index (BMI) 39.0-39.9, adult: Secondary | ICD-10-CM | POA: Diagnosis not present

## 2017-10-21 HISTORY — DX: Vitamin D deficiency, unspecified: E55.9

## 2017-10-21 HISTORY — PX: CATARACT EXTRACTION W/PHACO: SHX586

## 2017-10-21 HISTORY — DX: Unspecified osteoarthritis, unspecified site: M19.90

## 2017-10-21 HISTORY — DX: Polyneuropathy in diseases classified elsewhere: G63

## 2017-10-21 HISTORY — DX: Dyspnea, unspecified: R06.00

## 2017-10-21 SURGERY — PHACOEMULSIFICATION, CATARACT, WITH IOL INSERTION
Anesthesia: Monitor Anesthesia Care | Site: Eye | Laterality: Right | Wound class: "Clean "

## 2017-10-21 MED ORDER — MOXIFLOXACIN HCL 0.5 % OP SOLN
1.0000 [drp] | OPHTHALMIC | Status: DC | PRN
  Administered 2017-10-21 (×3): 1 [drp] via OPHTHALMIC

## 2017-10-21 MED ORDER — EPINEPHRINE PF 1 MG/ML IJ SOLN
INTRAOCULAR | Status: DC | PRN
  Administered 2017-10-21: 67 mL via OPHTHALMIC

## 2017-10-21 MED ORDER — FENTANYL CITRATE (PF) 100 MCG/2ML IJ SOLN
INTRAMUSCULAR | Status: DC | PRN
  Administered 2017-10-21: 50 ug via INTRAVENOUS

## 2017-10-21 MED ORDER — CEFUROXIME OPHTHALMIC INJECTION 1 MG/0.1 ML
INJECTION | OPHTHALMIC | Status: DC | PRN
Start: 2017-10-21 — End: 2017-10-21
  Administered 2017-10-21: 0.1 mL via INTRACAMERAL

## 2017-10-21 MED ORDER — MIDAZOLAM HCL 2 MG/2ML IJ SOLN
INTRAMUSCULAR | Status: DC | PRN
  Administered 2017-10-21 (×2): 1 mg via INTRAVENOUS

## 2017-10-21 MED ORDER — LACTATED RINGERS IV SOLN: 10.0000 mL/h | INTRAVENOUS | Status: DC

## 2017-10-21 MED ORDER — NA HYALUR & NA CHOND-NA HYALUR 0.4-0.35 ML IO KIT
PACK | INTRAOCULAR | Status: DC | PRN
  Administered 2017-10-21: 1 mL via INTRAOCULAR

## 2017-10-21 MED ORDER — BRIMONIDINE TARTRATE-TIMOLOL 0.2-0.5 % OP SOLN
OPHTHALMIC | Status: DC | PRN
  Administered 2017-10-21: 1 [drp] via OPHTHALMIC

## 2017-10-21 MED ORDER — ONDANSETRON HCL 4 MG/2ML IJ SOLN: 4.0000 mg | Freq: Once | INTRAMUSCULAR | Status: DC | PRN

## 2017-10-21 MED ORDER — ARMC OPHTHALMIC DILATING DROPS
1.0000 "application " | OPHTHALMIC | Status: DC | PRN
  Administered 2017-10-21 (×3): 1 via OPHTHALMIC

## 2017-10-21 MED ORDER — LIDOCAINE HCL (PF) 2 % IJ SOLN
INTRAOCULAR | Status: DC | PRN
  Administered 2017-10-21: 1 mL

## 2017-10-21 SURGICAL SUPPLY — 20 items
CANNULA ANT/CHMB 27G (MISCELLANEOUS) ×1 IMPLANT
CANNULA ANT/CHMB 27GA (MISCELLANEOUS) ×2 IMPLANT
GLOVE SURG LX 7.5 STRW (GLOVE) ×1
GLOVE SURG LX STRL 7.5 STRW (GLOVE) ×1 IMPLANT
GLOVE SURG TRIUMPH 8.0 PF LTX (GLOVE) ×2 IMPLANT
GOWN STRL REUS W/ TWL LRG LVL3 (GOWN DISPOSABLE) ×2 IMPLANT
GOWN STRL REUS W/TWL LRG LVL3 (GOWN DISPOSABLE) ×2
LENS IOL TECNIS ITEC 21.5 (Intraocular Lens) ×1 IMPLANT
MARKER SKIN DUAL TIP RULER LAB (MISCELLANEOUS) ×2 IMPLANT
NDL FILTER BLUNT 18X1 1/2 (NEEDLE) ×1 IMPLANT
NEEDLE FILTER BLUNT 18X 1/2SAF (NEEDLE) ×1
NEEDLE FILTER BLUNT 18X1 1/2 (NEEDLE) ×1 IMPLANT
PACK CATARACT BRASINGTON (MISCELLANEOUS) ×2 IMPLANT
PACK EYE AFTER SURG (MISCELLANEOUS) ×2 IMPLANT
PACK OPTHALMIC (MISCELLANEOUS) ×2 IMPLANT
SYR 3ML LL SCALE MARK (SYRINGE) ×2 IMPLANT
SYR 5ML LL (SYRINGE) ×2 IMPLANT
SYR TB 1ML LUER SLIP (SYRINGE) ×2 IMPLANT
WATER STERILE IRR 500ML POUR (IV SOLUTION) ×2 IMPLANT
WIPE NON LINTING 3.25X3.25 (MISCELLANEOUS) ×2 IMPLANT

## 2017-10-21 NOTE — H&P (Signed)
The History and Physical notes are on paper, have been signed, and are to be scanned. The patient remains stable and unchanged from the H&P.   Previous H&P reviewed, patient examined, and there are no changes.  Connie [name] 10/21/2017 7:38 AM

## 2017-10-21 NOTE — Anesthesia Preprocedure Evaluation (Signed)
Anesthesia Evaluation  Patient identified by MRN, date of birth, ID band Patient awake    Reviewed: Allergy & Precautions, NPO status , Patient's Chart, lab work & pertinent test results  Airway Mallampati: II  TM Distance: >3 FB     Dental   Pulmonary former smoker,    breath sounds clear to auscultation       Cardiovascular hypertension,  Rhythm:Regular Rate:Normal     Neuro/Psych  Headaches, PSYCHIATRIC DISORDERS Anxiety Depression Chronic pain Insomnia Daytime somnolence Chiari malformation type 1    GI/Hepatic GERD  Medicated,Fatty liver Hx gastric bypass   Endo/Other  Hypothyroidism Morbid obesity (BMI 39)  Renal/GU      Musculoskeletal  (+) Arthritis , Fibromyalgia -Osteoporosis Spinal stenosis DDD Cervicalgia Coccyx pain Spondylosis of lumbosacral region without myelopathy or radiculopathy   Abdominal   Peds  Hematology  (+) anemia ,   Anesthesia Other Findings   Reproductive/Obstetrics                             Anesthesia Physical Anesthesia Plan  ASA: III  Anesthesia Plan: MAC   Post-op Pain Management:    Induction:   PONV Risk Score and Plan: Midazolam  Airway Management Planned: Nasal Cannula  Additional Equipment:   Intra-op Plan:   Post-operative Plan:   Informed Consent: I have reviewed the patients History and Physical, chart, labs and discussed the procedure including the risks, benefits and alternatives for the proposed anesthesia with the patient or authorized representative who has indicated his/her understanding and acceptance.     Plan Discussed with: CRNA  Anesthesia Plan Comments:         Anesthesia Quick Evaluation

## 2017-10-21 NOTE — Op Note (Signed)
LOCATION:  [facility]   PREOPERATIVE DIAGNOSIS:    Nuclear sclerotic cataract right eye. H25.11   POSTOPERATIVE DIAGNOSIS:  Nuclear sclerotic cataract right eye.     PROCEDURE:  Phacoemusification with posterior chamber intraocular lens placement of the right eye   LENS:   Implant Name Type Inv. Item Serial No. Manufacturer Lot No. LRB No. Used  LENS IOL DIOP 21.5 - [id number] Intraocular Lens LENS IOL DIOP 21.5 [id number]  Right 1        ULTRASOUND TIME: 16 % of 1 minutes, 20 seconds.  CDE 13.0   SURGEON:  Wyonia Hough, MD   ANESTHESIA:  Topical with tetracaine drops and 2% Xylocaine jelly, augmented with 1% preservative-free intracameral lidocaine.    COMPLICATIONS:  None.   DESCRIPTION OF PROCEDURE:  The patient was identified in the holding room and transported to the operating room and placed in the supine position under the operating microscope.  The right eye was identified as the operative eye and it was prepped and draped in the usual sterile ophthalmic fashion.   A 1 millimeter clear-corneal paracentesis was made at the 12:00 position.  0.5 ml of preservative-free 1% lidocaine was injected into the anterior chamber. The anterior chamber was filled with Viscoat viscoelastic.  A 2.4 millimeter keratome was used to make a near-clear corneal incision at the 9:00 position.  A curvilinear capsulorrhexis was made with a cystotome and capsulorrhexis forceps.  Balanced salt solution was used to hydrodissect and hydrodelineate the nucleus.   Phacoemulsification was then used in stop and chop fashion to remove the lens nucleus and epinucleus.  The remaining cortex was then removed using the irrigation and aspiration handpiece. Provisc was then placed into the capsular bag to distend it for lens placement.  A lens was then injected into the capsular bag.  The remaining viscoelastic was aspirated.   Wounds were hydrated with balanced salt solution.  The anterior  chamber was inflated to a physiologic pressure with balanced salt solution.  No wound leaks were noted. Cefuroxime 0.1 ml of a 10mg /ml solution was injected into the anterior chamber for a dose of 1 mg of intracameral antibiotic at the completion of the case.   Timolol and Brimonidine drops were applied to the eye.  The patient was taken to the recovery room in stable condition without complications of anesthesia or surgery.   Nickie Warwick 10/21/2017, 8:31 AM

## 2017-10-21 NOTE — Transfer of Care (Signed)
Immediate Anesthesia Transfer of Care Note  Patient: Connie [name]  Procedure(s) Performed: CATARACT EXTRACTION PHACO AND INTRAOCULAR LENS PLACEMENT (IOC) RIGHT (Right Eye)  Patient Location: PACU  Anesthesia Type: MAC  Level of Consciousness: awake, alert  and patient cooperative  Airway and Oxygen Therapy: Patient Spontanous Breathing and Patient connected to supplemental oxygen  Post-op Assessment: Post-op Vital signs reviewed, Patient's Cardiovascular Status Stable, Respiratory Function Stable, Patent Airway and No signs of Nausea or vomiting  Post-op Vital Signs: Reviewed and stable  Complications: No apparent anesthesia complications

## 2017-10-21 NOTE — Anesthesia Procedure Notes (Signed)
Procedure Name: MAC Performed by: Mazie Fencl, CRNA Pre-anesthesia Checklist: Patient identified, Emergency Drugs available, Suction available, Timeout performed and Patient being monitored Patient Re-evaluated:Patient Re-evaluated prior to induction Oxygen Delivery Method: Nasal cannula Placement Confirmation: positive ETCO2       

## 2017-10-21 NOTE — Anesthesia Postprocedure Evaluation (Signed)
Anesthesia Post Note  Patient: Connie [name]  Procedure(s) Performed: CATARACT EXTRACTION PHACO AND INTRAOCULAR LENS PLACEMENT (IOC) RIGHT (Right Eye)  Patient location during evaluation: PACU Anesthesia Type: MAC Level of consciousness: awake and alert Pain management: pain level controlled Vital Signs Assessment: post-procedure vital signs reviewed and stable Respiratory status: spontaneous breathing, nonlabored ventilation, respiratory function stable and patient connected to nasal cannula oxygen Cardiovascular status: stable and blood pressure returned to baseline Postop Assessment: no apparent nausea or vomiting Anesthetic complications: no    Veda Canning

## 2017-10-22 ENCOUNTER — Encounter: Payer: Self-pay | Admitting: Ophthalmology

## 2017-10-28 ENCOUNTER — Other Ambulatory Visit: Payer: Self-pay | Admitting: Student in an Organized Health Care Education/Training Program

## 2017-10-31 NOTE — Progress Notes (Signed)
   THERAPIST PROGRESS NOTE  Session Time: 65mn  Participation Level: Active  Behavioral Response: CasualAlertEuthymic  Type of Therapy: Individual Therapy  Treatment Goals addressed: Coping and Diagnosis: Depression  Interventions: Supportive  Summary: Connie [name] a 63y.o. female who presents with continued symptoms of her diagnosis.  Therapist met with Patient in an initial therapy session to assess current mood and to build rapport. Therapist engaged Patient in discussion about her life and what is going well for her. Therapist provided support for Patient as she shared details about her life, her current stressors, mood, coping skills, her past, and her children. Therapist prompted Patient to discuss her support system and ways that she manages her daily stress, anger, and frustrations. LCSW discussed what psychotherapy is and is not and the importance of the therapeutic relationship to include open and honest communication between client and therapist and building trust.  Reviewed advantages and disadvantages of the therapeutic process and limitations to the therapeutic relationship including LCSW's role in maintaining the safety of the client, others and those in client's care.    Suicidal/Homicidal: No  Plan: Return again in 2 weeks.  Diagnosis: Axis I: Depression, moderate    Axis II: No diagnosis    Connie South LCSW 09/29/2017

## 2017-11-02 ENCOUNTER — Ambulatory Visit: Payer: 59 | Admitting: Licensed Clinical Social Worker

## 2017-11-03 ENCOUNTER — Encounter: Payer: Managed Care, Other (non HMO) | Admitting: Student in an Organized Health Care Education/Training Program

## 2017-11-05 ENCOUNTER — Encounter: Payer: Self-pay | Admitting: Student in an Organized Health Care Education/Training Program

## 2017-11-05 ENCOUNTER — Ambulatory Visit
Payer: Managed Care, Other (non HMO) | Attending: Student in an Organized Health Care Education/Training Program | Admitting: Student in an Organized Health Care Education/Training Program

## 2017-11-05 ENCOUNTER — Other Ambulatory Visit: Payer: Self-pay

## 2017-11-05 VITALS — BP 147/61 | HR 60 | Temp 98.5°F | Resp 16 | Ht 60.0 in | Wt 197.0 lb

## 2017-11-05 DIAGNOSIS — M5136 Other intervertebral disc degeneration, lumbar region: Secondary | ICD-10-CM

## 2017-11-05 DIAGNOSIS — F32A Depression, unspecified: Secondary | ICD-10-CM

## 2017-11-05 DIAGNOSIS — Z981 Arthrodesis status: Secondary | ICD-10-CM | POA: Diagnosis not present

## 2017-11-05 DIAGNOSIS — M47812 Spondylosis without myelopathy or radiculopathy, cervical region: Secondary | ICD-10-CM

## 2017-11-05 DIAGNOSIS — M797 Fibromyalgia: Secondary | ICD-10-CM

## 2017-11-05 DIAGNOSIS — G894 Chronic pain syndrome: Secondary | ICD-10-CM | POA: Diagnosis not present

## 2017-11-05 DIAGNOSIS — M47816 Spondylosis without myelopathy or radiculopathy, lumbar region: Secondary | ICD-10-CM

## 2017-11-05 DIAGNOSIS — Z9689 Presence of other specified functional implants: Secondary | ICD-10-CM

## 2017-11-05 DIAGNOSIS — F329 Major depressive disorder, single episode, unspecified: Secondary | ICD-10-CM

## 2017-11-05 DIAGNOSIS — M5030 Other cervical disc degeneration, unspecified cervical region: Secondary | ICD-10-CM

## 2017-11-05 MED ORDER — LEVORPHANOL TARTRATE 2 MG PO TABS: ORAL_TABLET | ORAL | 0 refills | Status: DC

## 2017-11-05 NOTE — Progress Notes (Signed)
Patient's Name: Connie [name]  MRN: [id number]  Referring Provider: Tracie Harrier, MD  DOB: 03-15-1956  PCP: Tracie Harrier, MD  DOS: 11/05/2017  Note by: [name], MD  Service setting: Ambulatory outpatient  Specialty: Interventional Pain Management  Location: [facility] (AMB) Pain Management Facility    Patient type: Established   Primary Reason(s) for Visit: Encounter for prescription drug management. (Level of risk: moderate)  CC: Back Pain (lower) and Neck Pain  HPI  Connie [name] is a 62 y.o. year old, female patient, who comes today for a medication management evaluation. She has Degenerative joint disease of cervical and lumbar spine; Lumbar spondylosis; History of lumbar fusion; Lumbar degenerative disc disease; Fibromyalgia; Cervicalgia; DDD (degenerative disc disease), cervical; Chronic pain syndrome; Major depressive disorder; Spinal cord stimulator status; Chiari malformation type I ([place]); Adiposis dolorosa; Acquired spondylolisthesis; Chronic constipation; Chronic midline low back pain; Chronic pain; Daytime somnolence; Delusions of parasitosis ([place]); Diastolic dysfunction; Dry eyes, bilateral; Dysphagia, oropharyngeal phase; Edema; GERD without esophagitis; Generalized osteoarthritis of multiple sites; Fluid collection at surgical site; Fatty liver; Fatigue; Essential hypertension; Internal hemorrhoid; Insomnia; Hypothyroidism; H/O gastric bypass; Seasonal allergies; Rotator cuff arthropathy of both shoulders; Osteoporosis; Obesity, morbid ([place]); Nonunion of fracture; Morgellons disease; Medication-induced delirium, acute, hyperactive ([place]); Lumbar pseudoarthrosis; Leg cramps; Iron deficiency; Joint pain; Spinal stenosis of lumbar region without neurogenic claudication; Arthritis; Complete tear of right rotator cuff; OA (osteoarthritis); Rotator cuff tendinitis, right; Lumbar post-laminectomy syndrome; Lumbar radiculopathy; Cervical radiculopathy; Bilateral hand pain; Candidiasis of skin  and nails; CRP elevated; Jaw pain, non-TMJ; Lumbosacral radiculitis; Lumbosacral spondylosis without myelopathy; Pain in the coccyx; Menopausal symptom; Temporal headache; Stiffness of shoulder joint; SOBOE (shortness of breath on exertion); Screening for osteoporosis; and Vitamin D deficiency, unspecified on their problem list. Her primarily concern today is the Back Pain (lower) and Neck Pain  Pain Assessment: Location: Lower Back Radiating: denies Onset: More than a month ago Duration: Chronic pain Quality: Constant, Aching, Stabbing, Shooting Severity: 1 /10 (subjective, self-reported pain score)  Note: Reported level is compatible with observation.                         When using our objective Pain Scale, levels between 6 and 10/10 are said to belong in an emergency room, as it progressively worsens from a 6/10, described as severely limiting, requiring emergency care not usually available at an outpatient pain management facility. At a 6/10 level, communication becomes difficult and requires great effort. Assistance to reach the emergency department may be required. Facial flushing and profuse sweating along with potentially dangerous increases in heart rate and blood pressure will be evident. Effect on ADL: pain requires continued use of walker for stability Timing: Constant Modifying factors: medications, procedures, rest, reclining BP: (!) 147/61  HR: 60  Connie [name] was last scheduled for an appointment on 10/28/2017 for medication management. During today's appointment we reviewed Connie [name]'s chronic pain status, as well as her outpatient medication regimen.  Patient's chronic pain at baseline.  No changes in her medical history since last visit.   patient's mother passed away this past weekend and has been coping with her loss.  States that she is doing well in the context of everything.  She is planning to go camping next week.  The patient  reports that she does not use drugs. Her  body mass index is 38.47 kg/m.  Further details on both, my assessment(s), as well as the proposed treatment plan, please see below.  Controlled Substance Pharmacotherapy Assessment REMS (Risk Evaluation and Mitigation Strategy)  Analgesic:Levorphanol3 Mg, 61m 2 mg, daily, quantity 105, MME equals 77 MME/day:761mday.  WeRise PatienceRN  11/05/2017  1:18 PM  Sign at close encounter Nursing Pain Medication Assessment:  Safety precautions to be maintained throughout the outpatient stay will include: orient to surroundings, keep bed in low position, maintain call bell within reach at [name] times, provide assistance with transfer out of bed and ambulation.  Medication Inspection Compliance: Pill count conducted under aseptic conditions, in front of the patient. Neither the pills nor the bottle was removed from the patient's sight at any time. Once count was completed pills were immediately returned to the patient in their original bottle.  Medication: Levorphanol Pill/Patch Count: 14 of 105 pills remain Pill/Patch Appearance: Markings consistent with prescribed medication Bottle Appearance: Standard pharmacy container. Clearly labeled. Filled Date: 6 / 1870 2019 Last Medication intake:  Today   Pt presented bottle #2 of 2   Pharmacokinetics: Liberation and absorption (onset of action): WNL Distribution (time to peak effect): WNL Metabolism and excretion (duration of action): WNL         Pharmacodynamics: Desired effects: Analgesia: Connie [name] >50% benefit. Functional ability: Patient reports that medication allows her to accomplish basic ADLs Clinically meaningful improvement in function (CMIF): Sustained CMIF goals met Perceived effectiveness: Described as relatively effective, allowing for increase in activities of daily living (ADL) Undesirable effects: Side-effects or Adverse reactions: None reported Monitoring: [place] PMP: Online review of the past 1227-monthriod  conducted. Compliant with practice rules and regulations Last UDS on record: Summary  Date Value Ref Range Status  06/04/2017 FINAL  Final    Comment:    ==================================================================== TOXASSURE SELECT 13 (MW) ==================================================================== Specimen Alert Note:  Urinary creatinine is low; ability to detect some drugs may be compromised.  Interpret results with caution. ==================================================================== Test                             Result       Flag       Units   NO DRUGS DETECTED. ==================================================================== Test                      Result    Flag   Units      Ref Range   Creatinine              16        L      mg/dL      >=20 ==================================================================== Declared Medications:  The flagging and interpretation on this report are based on the  following declared medications.  Unexpected results may arise from  inaccuracies in the declared medications.  **Note: The testing scope of this panel does not include following  reported medications:  Bupropion  Celecoxib  Diphenhydramine (Benadryl)  Fluticasone (Flonase)  Furosemide (Lasix)  Gabapentin  Glycopyrrolate  Ibuprofen  Levorphanol  Loratadine  Metoprolol  Ondansetron  Pantoprazole ==================================================================== For clinical consultation, please call ([phone]===    UDS interpretation: Compliant          Medication Assessment Form: Reviewed. Patient indicates being compliant with therapy Treatment compliance: Compliant Risk Assessment Profile: Aberrant behavior: See prior evaluations. None observed or detected today Comorbid factors increasing risk of overdose: See prior notes. No additional risks detected  today Risk of substance use disorder (SUD): Low Opioid Risk Tool - 11/05/17 1330  Family History of Substance Abuse   Alcohol  Positive Female    Illegal Drugs  Negative    Rx Drugs  Negative      Personal History of Substance Abuse   Alcohol  Negative    Illegal Drugs  Negative    Rx Drugs  Negative      Age   Age between 76-45 years   No      Psychological Disease   Psychological Disease  Negative    Depression  Positive      Total Score   Opioid Risk Tool Scoring  2    Opioid Risk Interpretation  Low Risk      ORT Scoring interpretation table:  Score <3 = Low Risk for SUD  Score between 4-7 = Moderate Risk for SUD  Score >8 = High Risk for Opioid Abuse   Risk Mitigation Strategies:  Patient Counseling: Covered Patient-Prescriber Agreement (PPA): Present and active  Notification to other healthcare providers: Done  Pharmacologic Plan: No change in therapy, at this time.             Laboratory Chemistry  Inflammation Markers (CRP: Acute Phase) (ESR: Chronic Phase) No results found for: CRP, ESRSEDRATE, LATICACIDVEN                       Rheumatology Markers No results found for: RF, ANA, LABURIC, URICUR, LYMEIGGIGMAB, LYMEABIGMQN, HLAB27                      Renal Function Markers Lab Results  Component Value Date   BUN 18 06/23/2017   CREATININE 0.60 06/23/2017   GFRAA >60 06/23/2017   GFRNONAA >60 06/23/2017                             Hepatic Function Markers Lab Results  Component Value Date   AST 20 06/23/2017   ALT 9 (L) 06/23/2017   ALBUMIN 3.6 06/23/2017   ALKPHOS 88 06/23/2017                        Electrolytes Lab Results  Component Value Date   NA 135 06/23/2017   K 4.9 06/23/2017   CL 102 06/23/2017   CALCIUM 8.5 (L) 06/23/2017                        Neuropathy Markers No results found for: VITAMINB12, FOLATE, HGBA1C, HIV                      Bone Pathology Markers No results found for: VD25OH, VD125OH2TOT, [id number],  [id number], 25OHVITD1, 25OHVITD2, 25OHVITD3, TESTOFREE, TESTOSTERONE                       Coagulation Parameters Lab Results  Component Value Date   PLT 211 06/23/2017                        Cardiovascular Markers Lab Results  Component Value Date   HGB 11.8 (L) 06/23/2017   HCT 36.1 06/23/2017                         CA Markers No results found for: CEA, CA125, LABCA2  Note: Lab results reviewed.  Recent Diagnostic Imaging Results  DG C-Arm 1-60 Min-No Report Fluoroscopy was utilized by the requesting physician.  No radiographic  interpretation.   Complexity Note: Imaging results reviewed. Results shared with Connie [name], using Layman's terms.                         Meds   Current Outpatient Medications:  .  alendronate (FOSAMAX) 70 MG tablet, Take 70 mg by mouth once a week. Every Sunday, Disp: , Rfl:  .  buPROPion (WELLBUTRIN XL) 300 MG 24 hr tablet, Take 1 tablet (300 mg total) by mouth daily. (Patient taking differently: Take 300 mg by mouth daily. AM), Disp: 90 tablet, Rfl: 1 .  celecoxib (CELEBREX) 200 MG capsule, Take 200 mg by mouth daily. AM, Disp: , Rfl:  .  furosemide (LASIX) 20 MG tablet, Take 20 mg by mouth as needed., Disp: , Rfl:  .  gabapentin (NEURONTIN) 300 MG capsule, Take 1 capsule (300 mg total) by mouth 2 (two) times daily. (Patient taking differently: Take 300 mg by mouth 2 (two) times daily. AM AND HS), Disp: 60 capsule, Rfl: 2 .  glycopyrrolate (ROBINUL) 1 MG tablet, Take 1 mg by mouth 2 (two) times daily. AM AND HS, Disp: , Rfl: 3 .  hydrOXYzine (ATARAX/VISTARIL) 10 MG tablet, Take 1-2 tablets (10-20 mg total) by mouth at bedtime as needed for anxiety., Disp: 180 tablet, Rfl: 1 .  ibuprofen (ADVIL,MOTRIN) 200 MG tablet, Take 400 mg by mouth daily as needed for headache or moderate pain., Disp: , Rfl:  .  levorphanol (LEVODROMORAN) 2 MG tablet, 3 mg in am, 2 mg in afternoon, and 36m evening For chronic pain To last 30 days from fill  date Fill on or after: 11/06/17, 12/06/17, 01/05/18, Disp: 105 tablet, Rfl: 0 .  levothyroxine (SYNTHROID, LEVOTHROID) 25 MCG tablet, Take 25 mcg by mouth once daily before breakfast, Disp: , Rfl: 0 .  lipase/protease/amylase (CREON) 36000 UNITS CPEP capsule, Take 36,000 Units by mouth 3 (three) times daily with meals., Disp: , Rfl:  .  metoprolol succinate (TOPROL-XL) 100 MG 24 hr tablet, Take 100 mg by mouth once daily in the evening, Disp: , Rfl: 0 .  ondansetron (ZOFRAN-ODT) 8 MG disintegrating tablet, Take 8 mg by mouth daily as needed for nausea or vomiting., Disp: , Rfl:  .  pantoprazole (PROTONIX) 40 MG tablet, Take 40 mg by mouth once daily in the evening, Disp: , Rfl:  .  predniSONE (DELTASONE) 5 MG tablet, Take 4 mg by mouth daily., Disp: , Rfl:  .  traZODone (DESYREL) 100 MG tablet, Take 1-2 tablets (100-200 mg total) by mouth at bedtime as needed for sleep. (Patient taking differently: Take 50 mg by mouth at bedtime. ), Disp: 180 tablet, Rfl: 1 .  loratadine (CLARITIN) 10 MG tablet, Take 10 mg by mouth daily as needed for allergies., Disp: , Rfl:  .  vitamin B-12 (CYANOCOBALAMIN) 1000 MCG tablet, Take 1,000 mcg by mouth daily., Disp: , Rfl:  .  Vitamin D, Ergocalciferol, (DRISDOL) 50000 units CAPS capsule, Take one capsule once a week for 12 weeks, Disp: , Rfl:   ROS  Constitutional: Denies any fever or chills Gastrointestinal: No reported hemesis, hematochezia, vomiting, or acute GI distress Musculoskeletal: Denies any acute onset joint swelling, redness, loss of ROM, or weakness Neurological: No reported episodes of acute onset apraxia, aphasia, dysarthria, agnosia, amnesia, paralysis, loss of coordination, or loss of consciousness  Allergies  Connie [name] is allergic to tape.  PFSH  Drug: Connie [name]  reports that she does not use drugs. Alcohol:  reports that she does not drink alcohol. Tobacco:  reports that she quit smoking about 10 years ago. Her smoking use included cigarettes.  She has a 10.00 pack-year smoking history. She has never used smokeless tobacco. Medical:  has a past medical history of Anemia, Anxiety, Arthritis, Cervical spondylosis with myelopathy (2018), Chronic pain (2019), DDD (degenerative disc disease), lumbar, Depression, Displacement of cervical intervertebral disc (2018), Dyspnea, Fibromyalgia, GERD (gastroesophageal reflux disease), Heart murmur, Hypertension, Hypothyroidism, Lumbar post-laminectomy syndrome (2018), Lumbosacral radiculitis (2018), Neuropathy due to medical condition ([place]), Osteoarthritis, Osteoporosis, Other long term (current) drug therapy (2019), Pain in the coccyx (2018), Spondylolisthesis (2018), Spondylosis of lumbosacral region without myelopathy or radiculopathy (2018), Spondylosis with myelopathy, lumbar region (2018), and Vitamin D deficiency. Surgical: Connie [name]  has a past surgical history that includes Cesarean section; Gastric bypass (2004); Lumbar fusion (2016); Spinal cord stimulator implant (2018); Cholecystectomy; Appendectomy; Tonsillectomy; Shoulder arthroscopy with rotator cuff repair (Right, 2008); Joint replacement (Bilateral, 2005); Abdominal hysterectomy (2000); epidural injection (2018); arm surgery (2002); Breast biopsy (Left, 2005); Colonoscopy with propofol (N/A, 08/25/2017); Temporal artery biopsy / ligation; and Cataract extraction w/PHACO (Right, 10/21/2017). Family: family history includes Anxiety disorder in her mother; Breast cancer in her cousin, maternal aunt, and mother; Cancer in her brother, father, and mother; Depression in her mother; Hypertension in her father; Kidney disease in her mother.  Constitutional Exam  General appearance: Well nourished, well developed, and well hydrated. In no apparent acute distress Vitals:   11/05/17 1320  BP: (!) 147/61  Pulse: 60  Resp: 16  Temp: 98.5 F (36.9 C)  TempSrc: Oral  SpO2: 95%  Weight: 197 lb (89.4 kg)  Height: 5' (1.524 m)   BMI Assessment:  Estimated body mass index is 38.47 kg/m as calculated from the following:   Height as of this encounter: 5' (1.524 m).   Weight as of this encounter: 197 lb (89.4 kg).  BMI interpretation table: BMI level Category Range association with higher incidence of chronic pain  <18 kg/m2 Underweight   18.5-24.9 kg/m2 Ideal body weight   25-29.9 kg/m2 Overweight Increased incidence by 20%  30-34.9 kg/m2 Obese (Class I) Increased incidence by 68%  35-39.9 kg/m2 Severe obesity (Class II) Increased incidence by 136%  >40 kg/m2 Extreme obesity (Class III) Increased incidence by 254%   Patient's current BMI Ideal Body weight  Body mass index is 38.47 kg/m. Ideal body weight: 45.5 kg (100 lb 4.9 oz) Adjusted ideal body weight: 63 kg (138 lb 15.8 oz)   BMI Readings from Last 4 Encounters:  11/05/17 38.47 kg/m  10/21/17 38.67 kg/m  09/17/17 38.73 kg/m  09/14/17 38.73 kg/m   Wt Readings from Last 4 Encounters:  11/05/17 197 lb (89.4 kg)  10/21/17 198 lb (89.8 kg)  09/17/17 205 lb (93 kg)  09/14/17 205 lb (93 kg)  Psych/Mental status: Alert, oriented x 3 (person, place, & time)       Eyes: PERLA Respiratory: No evidence of acute respiratory distress Cervical Spine Area Exam  Skin & Axial Inspection: No masses, redness, edema, swelling, or associated skin lesions Alignment: Symmetrical Functional ROM: Decreased ROM      Stability: No instability detected Muscle Tone/Strength: Functionally intact. No obvious neuro-muscular anomalies detected. Sensory (Neurological): Musculoskeletal pain pattern Palpation: Complains of area being tender to palpation Positive provocative maneuver for for cervical facet disease left greater than right  Upper Extremity (UE) Exam    Side: Right upper extremity  Side: Left upper extremity   Skin & Extremity Inspection: Skin color, temperature, and hair growth are WNL. No peripheral edema or cyanosis. No masses, redness, swelling, asymmetry, or  associated skin lesions. No contractures.  Skin & Extremity Inspection: Skin color, temperature, and hair growth are WNL. No peripheral edema or cyanosis. No masses, redness, swelling, asymmetry, or associated skin lesions. No contractures.   Functional ROM: Unrestricted ROM          Functional ROM: Unrestricted ROM           Muscle Tone/Strength: Functionally intact. No obvious neuro-muscular anomalies detected.  Muscle Tone/Strength: Functionally intact. No obvious neuro-muscular anomalies detected.   Sensory (Neurological): Unimpaired          Sensory (Neurological): Unimpaired           Palpation: No palpable anomalies              Palpation: No palpable anomalies               Specialized Test(s): Deferred         Specialized Test(s): Deferred           Thoracic Spine Area Exam  Skin & Axial Inspection: No masses, redness, or swelling Alignment: Symmetrical Functional ROM: Unrestricted ROM Stability: No instability detected Muscle Tone/Strength: Functionally intact. No obvious neuro-muscular anomalies detected. Sensory (Neurological): Unimpaired Muscle strength & Tone: No palpable anomalies  Lumbar Spine Area Exam  Skin & Axial Inspection: Well healed scar from previous spine surgery detected Alignment: Scoliosis detected Functional ROM: Diminished ROM      Stability: No instability detected Muscle Tone/Strength: Functionally intact. No obvious neuro-muscular anomalies detected. Sensory (Neurological): Dermatomal pain pattern and musculoskeletal Palpation: Complains of area being tender to palpation       Provocative Tests: Lumbar Hyperextension and rotation test: Positive bilaterally for facet joint pain. Lumbar Lateral bending test: evaluation deferred today       Patrick's Maneuver: evaluation deferred today                    Gait & Posture Assessment  Ambulation: Unassisted Gait: Relatively normal for age and body habitus Posture: WNL   Lower Extremity Exam     Side: Right lower extremity  Side: Left lower extremity  Skin & Extremity Inspection: Skin color, temperature, and hair growth are WNL. No peripheral edema or cyanosis. No masses, redness, swelling, asymmetry, or associated skin lesions. No contractures.  Skin & Extremity Inspection: Skin color, temperature, and hair growth are WNL. No peripheral edema or cyanosis. No masses, redness, swelling, asymmetry, or associated skin lesions. No contractures.  Functional ROM: Unrestricted ROM          Functional ROM: Unrestricted ROM          Muscle Tone/Strength: Functionally intact. No obvious neuro-muscular anomalies detected.  Muscle Tone/Strength: Functionally intact. No obvious neuro-muscular anomalies detected.  Sensory (Neurological): Unimpaired  Sensory (Neurological): Unimpaired  Palpation: No palpable anomalies  Palpation: No palpable anomalies   Assessment  Primary Diagnosis & Pertinent Problem List: The primary encounter diagnosis was Spondylosis of cervical region without myelopathy or radiculopathy. Diagnoses of Cervical facet joint syndrome (L>R, C4/5/6/7), Chronic pain syndrome, History of lumbar fusion, Lumbar degenerative disc disease, Lumbar spondylosis, Fibromyalgia, DDD (degenerative disc disease), cervical, Major depressive disorder, remission status unspecified, unspecified whether recurrent, Spinal cord stimulator status, and Depression, unspecified depression type were also pertinent to this visit.  Status  Diagnosis  Controlled Controlled Controlled 1. Spondylosis of cervical region without myelopathy or radiculopathy   2. Cervical facet joint syndrome (L>R, C4/5/6/7)   3. Chronic pain syndrome   4. History of lumbar fusion   5. Lumbar degenerative disc disease   6. Lumbar spondylosis   7. Fibromyalgia   8. DDD (degenerative disc disease), cervical   9. Major depressive disorder, remission status unspecified, unspecified whether recurrent   10. Spinal cord stimulator  status   11. Depression, unspecified depression type     General Recommendations: The pain condition that the patient suffers from is best treated with a multidisciplinary approach that involves an increase in physical activity to prevent de-conditioning and worsening of the pain cycle, as well as psychological counseling (formal and/or informal) to address the co-morbid psychological affects of pain. Treatment will often involve judicious use of pain medications and interventional procedures to decrease the pain, allowing the patient to participate in the physical activity that will ultimately produce long-lasting pain reductions. The goal of the multidisciplinary approach is to return the patient to a higher level of overall function and to restore their ability to perform activities of daily living.  62 year old female with chronic pain syndrome secondary to lumbar degenerative disc disease, lumbar radiculopathy, lumbar spondylosis status post L3-L5 lumbar fusion who presents with worsening neck pain that radiates into bilateral shoulders and bilateral upper extremities. Upon chart review from neurosurgery note, patient's MRI from 2016 showed mild compression at C5-C6 and stenosis at C6/7.  Patient's CT myelogram was reviewed with her today which reveals left disc osteophyte complex with mild central canal stenosis at C5-6, facet hypertrophy and cervical spondylosis at C4/5/6/7, left greater than right.    Patient is status post diagnostic bilateral C4, 5, 6, 7 cervical facet medial branch nerve blocks which were effective for pain.  These were performed on 4/24 and the patient is still having ongoing benefit although reduced from the blocks.  We can consider repeating bilateral cervical facet medial branch nerve blocks in the future.  Otherwise I will refill the patient's levorphanol as below.  She is instructed to continue her gabapentin as prescribed and does not need any refills.  Plan: -Refill  levorphanol  at previous dose below -Continue gabapentin as prescribed, no refills needed -Can consider repeat cervical facet medial branch nerve block #2, bilateral at C4, 5, 6, 7 for cervical spondylosis and facet arthropathy if neck pain worsens.     Plan of Care  Pharmacotherapy (Medications Ordered): Meds ordered this encounter  Medications  . DISCONTD: levorphanol (LEVODROMORAN) 2 MG tablet    Sig: 3 mg in am, 2 mg in afternoon, and 59m evening For chronic pain To last 30 days from fill date Fill on or after: 11/06/17, 12/06/17, 01/05/18    Dispense:  105 tablet    Refill:  0  . DISCONTD: levorphanol (LEVODROMORAN) 2 MG tablet    Sig: 3 mg in am, 2 mg in afternoon, and 267mevening For chronic pain To last 30 days from fill date Fill on or after: 11/06/17, 12/06/17, 01/05/18    Dispense:  105 tablet    Refill:  0  . levorphanol (LEVODROMORAN) 2 MG tablet    Sig: 3 mg in am, 2 mg in afternoon, and 35m24mvening For chronic pain To last 30 days from fill date Fill on or after: 11/06/17, 12/06/17, 01/05/18    Dispense:  105 tablet    Refill:  0    Provider-requested follow-up: Return in about  3 months (around 02/05/2018) for Medication Management.  Time Note: Greater than 50% of the 25 minute(s) of face-to-face time spent with Ms. Dupler, was spent in counseling/coordination of care regarding: Ms. Diguglielmo primary cause of pain, the treatment plan, treatment alternatives, the risks and possible complications of proposed treatment, medication side effects, going over the informed consent, the opioid analgesic risks and possible complications, the results, interpretation and significance of  her recent diagnostic interventional treatment(s), the appropriate use of her medications, realistic expectations, the goals of pain management (increased in functionality), the medication agreement and the patient's responsibilities when it comes to controlled substances.  Future Appointments  Date  Time Provider [place]  11/30/2017  2:00 PM Ursula Alert, MD [facility] None  02/01/2018 12:30 PM [name], [facility] None    Primary Care Physician: Tracie Harrier, MD Location: [facility] Note by: [name], M.D Date: 11/05/2017; Time: 3:40 PM  Patient Instructions  You have been given 3 Rx for Levorphenol.

## 2017-11-05 NOTE — Progress Notes (Signed)
Nursing Pain Medication Assessment:  Safety precautions to be maintained throughout the outpatient stay will include: orient to surroundings, keep bed in low position, maintain call bell within reach at all times, provide assistance with transfer out of bed and ambulation.  Medication Inspection Compliance: Pill count conducted under aseptic conditions, in front of the patient. Neither the pills nor the bottle was removed from the patient's sight at any time. Once count was completed pills were immediately returned to the patient in their original bottle.  Medication: Levorphanol Pill/Patch Count: 14 of 105 pills remain Pill/Patch Appearance: Markings consistent with prescribed medication Bottle Appearance: Standard pharmacy container. Clearly labeled. Filled Date: 6 / 40 / 2019 Last Medication intake:  Today   Pt presented bottle #2 of 2

## 2017-11-05 NOTE — Patient Instructions (Signed)
You have been given 3 Rx for Levorphenol.

## 2017-11-10 ENCOUNTER — Ambulatory Visit: Payer: Managed Care, Other (non HMO) | Admitting: Student in an Organized Health Care Education/Training Program

## 2017-11-11 ENCOUNTER — Encounter: Payer: Self-pay | Admitting: Student in an Organized Health Care Education/Training Program

## 2017-11-24 ENCOUNTER — Ambulatory Visit: Payer: 59 | Admitting: Psychiatry

## 2017-11-30 ENCOUNTER — Encounter: Payer: Self-pay | Admitting: Psychiatry

## 2017-11-30 ENCOUNTER — Ambulatory Visit (INDEPENDENT_AMBULATORY_CARE_PROVIDER_SITE_OTHER): Payer: 59 | Admitting: Psychiatry

## 2017-11-30 ENCOUNTER — Other Ambulatory Visit: Payer: Self-pay

## 2017-11-30 VITALS — BP 155/68 | HR 59 | Temp 98.1°F | Wt 190.8 lb

## 2017-11-30 DIAGNOSIS — F5105 Insomnia due to other mental disorder: Secondary | ICD-10-CM | POA: Diagnosis not present

## 2017-11-30 DIAGNOSIS — Z634 Disappearance and death of family member: Secondary | ICD-10-CM | POA: Diagnosis not present

## 2017-11-30 DIAGNOSIS — F331 Major depressive disorder, recurrent, moderate: Secondary | ICD-10-CM | POA: Diagnosis not present

## 2017-11-30 MED ORDER — BUPROPION HCL ER (XL) 300 MG PO TB24: 300.0000 mg | ORAL_TABLET | Freq: Every day | ORAL | 1 refills | Status: DC

## 2017-11-30 NOTE — Patient Instructions (Signed)
Coping With Loss, Adult People experience loss in many different ways throughout their lives. Events such as moving, changing jobs, and losing friends can create a sense of loss. The loss may be as serious as a major health change, divorce, death of a pet, or death of a loved one. All of these types of loss are likely to create a physical and emotional reaction known as grief. Grief is the result of a major change or an absence of something or someone that you count on. Grief is a normal reaction to loss. How to recognize changes A variety of factors can affect your grieving experience, including:  The nature of your loss.  Your relationship to what or whom you lost.  Your understanding of grief and how to cope with it.  Your support system.  The way that you deal with your grief will affect your ability to function as you normally do. When you are grieving, you may experience:  Numbness, shock, sadness, anxiety, anger, denial, and guilt.  Thoughts about death.  Unexpected crying.  A physical sensation of emptiness in your gut.  Problems sleeping and eating.  Fatigue.  Loss of interest in normal activities.  Dreaming about or imagining seeing the person who died.  A need to remember what or whom you lost.  Difficulty thinking about anything other than your loss for a period of time.  Relief. If you have been expecting the loss for a while, you may feel a sense of relief when it happens.  Where to find support To get support for coping with loss:  Ask your health care provider for help and recommendations, such as grief counseling or therapy.  Think about joining a support group for people who are coping with loss.  Follow these instructions at home:  Be patient with yourself and others. Allow the grieving process to happen, and remember that grieving takes time. ? It is likely that you may never feel completely done with some grief. You may find a way to move on while  still cherishing memories and feelings about your loss. ? Accepting your loss is a process. It can take months or longer to adjust.  Express your feelings in healthy ways, such as: ? Talking with others about your loss. It may be helpful to find others who have had a similar loss, such as a support group. ? Writing down your feelings in a journal. ? Doing physical activities to release stress and emotional energy. ? Doing creative activities like painting, sculpting, or playing or listening to music. ? Practicing resilience. This is the ability to recover and adjust after facing challenges. Reading some resources that encourage resilience may help you to learn ways to practice those behaviors.  Keep to your normal routine as much as possible. If you have trouble focusing or doing normal activities, it is acceptable to take some time away from your normal routine.  Spend time with friends and loved ones.  Eat a healthy diet, get plenty of sleep, and rest when you feel tired. Where to find more information: You can find more information about coping with loss from:  American Society of Clinical Oncology: www.cancer.net  American Psychological Association: www.apa.org  Contact a health care provider if:  Your grief is extreme and keeps getting worse.  You have ongoing grief that does not improve.  Your body shows symptoms of grief, such as illness.  You feel depressed, anxious, or lonely. Get help right away if:  You have   thoughts about hurting yourself or others. If you ever feel like you may hurt yourself or others, or have thoughts about taking your own life, get help right away. You can go to your nearest emergency department or call:  Your local emergency services (911 in the [place]  A suicide crisis helpline, such as the National Suicide Prevention Lifeline at 1-800-273-8255. This is open 24 hours a day.  Summary  Grief is a normal part of experiencing a loss. It is the  result of a major change or an absence of something or someone that you count on.  The depth of grief and the period of recovery depend on the type of loss as well as your ability to adjust to the change and process your feelings.  Processing grief requires patience and a willingness to accept your feelings and talk about your loss with people who are supportive.  It is important to find resources that work for you and to realize that we are all different when it comes to grief. There is not one single grieving process that works for everyone in the same way.  Be aware that when grief becomes extreme, it can lead to more severe issues like isolation, depression, anxiety, or suicidal thoughts. Talk with your health care provider if you have any of these issues. This information is not intended to replace advice given to you by your health care provider. Make sure you discuss any questions you have with your health care provider. Document Released: 08/28/2016 Document Revised: 08/28/2016 Document Reviewed: 08/28/2016 Elsevier Interactive Patient Education  2018 Elsevier Inc.  

## 2017-11-30 NOTE — Progress Notes (Signed)
[facility] OP Progress Note  11/30/2017 3:03 PM Daiya Tamer Losier  MRN:  [id number]  Chief Complaint: ' I am here for follow up.' Chief Complaint    Follow-up; Medication Refill     HPI: Connie [name] is a 62 year old Caucasian female, married, on SSD, lives in [place], has a history of depression, history of delusional parasitosis, chronic pain, fibromyalgia, hypothyroidism, hypertension, polyarthralgia, presented to the clinic today for a follow-up visit.  Patient continues to have limited mobility due to her multiple medical problems including poly arthralgia and hence walks with the help of a walker, stooped over while walking.  Patient also has chronic pain syndrome.  Patient today reports that her mother who had dementia  recently passed away.  She passed away on November 10, 2022.  Patient reports she has accepted the fact that her mother has passed away and has been coping well.  She reports that at times  she misses her but overall she has been doing okay.  Patient reports her husband continues to be supportive.  Patient reports she had to cancel her appointment to see Ms. Peacock last month and would like to reschedule an appointment to see her.  Patient otherwise reports she is doing well on her current medication regimen.  Patient denies any suicidality.  Patient denies any perceptual disturbances.   Visit Diagnosis:    ICD-10-CM   1. MDD (major depressive disorder), recurrent episode, moderate (HCC) F33.1 buPROPion (WELLBUTRIN XL) 300 MG 24 hr tablet  2. Insomnia due to mental disorder F51.05   3. Bereavement Z63.4     Past Psychiatric History: Reviewed past psychiatric history from my progress note on 08/06/2017.  Past trials of Latuda, Wellbutrin.  Past Medical History:  Past Medical History:  Diagnosis Date  . Anemia    in the past  . Anxiety   . Arthritis    all over  . Cervical spondylosis with myelopathy 2018  . Chronic pain 2019   can't stand straight, uses walker for stability  .  DDD (degenerative disc disease), lumbar   . Depression   . Displacement of cervical intervertebral disc 2018  . Dyspnea    HANDICAPPED  . Fibromyalgia    Polymyalgia Rheumatica  . GERD (gastroesophageal reflux disease)   . Heart murmur    not treated or being followed  . Hypertension    CONTROLLED  . Hypothyroidism   . Lumbar post-laminectomy syndrome 2018  . Lumbosacral radiculitis 2018  . Neuropathy due to medical condition (HCC)    feet, toes, fingers  . Osteoarthritis   . Osteoporosis   . Other long term (current) drug therapy 2019   from pain management  . Pain in the coccyx 2018   disorder of sacrum  . Spondylolisthesis 2018  . Spondylosis of lumbosacral region without myelopathy or radiculopathy 2018  . Spondylosis with myelopathy, lumbar region 2018  . Vitamin D deficiency     Past Surgical History:  Procedure Laterality Date  . ABDOMINAL HYSTERECTOMY  2000  . APPENDECTOMY    . arm surgery  2002  . BREAST BIOPSY Left 2005   benign  . CATARACT EXTRACTION W/PHACO Right 10/21/2017   Procedure: CATARACT EXTRACTION PHACO AND INTRAOCULAR LENS PLACEMENT ([place]) RIGHT;  Surgeon: Leandrew Koyanagi, MD;  Location: [facility];  Service: Ophthalmology;  Laterality: Right;  neck issues  . CESAREAN SECTION     x 2  . CHOLECYSTECTOMY    . COLONOSCOPY WITH PROPOFOL N/A 08/25/2017   Procedure: COLONOSCOPY WITH PROPOFOL;  Surgeon: Toledo, Benay Pike, MD;  Location: [facility];  Service: Gastroenterology;  Laterality: N/A;  . epidural injection  2018   steroids  . GASTRIC BYPASS  2004  . JOINT REPLACEMENT Bilateral 2005   knees  . LUMBAR FUSION  2016   x 2/ L3-5  . SHOULDER ARTHROSCOPY WITH ROTATOR CUFF REPAIR Right 2008   x 3  . SPINAL CORD STIMULATOR IMPLANT  2018   [facility] scientific  . TEMPORAL ARTERY BIOPSY / LIGATION    . TONSILLECTOMY      Family Psychiatric History: Have reviewed family psychiatric history from my progress note on 08/06/2017.  Family  History:  Family History  Problem Relation Age of Onset  . Kidney disease Mother   . Cancer Mother   . Anxiety disorder Mother   . Depression Mother   . Breast cancer Mother   . Cancer Father   . Hypertension Father   . Cancer Brother   . Breast cancer Maternal Aunt   . Breast cancer Cousin    Substance abuse history: Denies  Social History: Reviewed social history from my progress note on 08/06/2017. Social History   Socioeconomic History  . Marital status: Married    Spouse name: brady   . Number of children: 2  . Years of education: Not on file  . Highest education level: High school graduate  Occupational History    Comment: disabled   Social Needs  . Financial resource strain: Somewhat hard  . Food insecurity:    Worry: Never true    Inability: Never true  . Transportation needs:    Medical: No    Non-medical: No  Tobacco Use  . Smoking status: Former Smoker    Packs/day: 0.50    Years: 20.00    Pack years: 10.00    Types: Cigarettes    Last attempt to quit: 05/21/2007    Years since quitting: 10.5  . Smokeless tobacco: Never Used  Substance and Sexual Activity  . Alcohol use: No    Frequency: Never  . Drug use: No    Comment: managed by pain clinic  . Sexual activity: Not Currently  Lifestyle  . Physical activity:    Days per week: 0 days    Minutes per session: 0 min  . Stress: Not on file  Relationships  . Social connections:    Talks on phone: Three times a week    Gets together: Once a week    Attends religious service: More than 4 times per year    Active member of club or organization: No    Attends meetings of clubs or organizations: Never    Relationship status: Married  Other Topics Concern  . Not on file  Social History Narrative  . Not on file    Allergies:  Allergies  Allergen Reactions  . Tape Rash    Skin peels off and blisters.  Probably was clear plastic tape.    Metabolic Disorder Labs: No results found for: HGBA1C,  MPG No results found for: PROLACTIN No results found for: CHOL, TRIG, HDL, CHOLHDL, VLDL, LDLCALC No results found for: TSH  Therapeutic Level Labs: No results found for: LITHIUM No results found for: VALPROATE No components found for:  CBMZ  Current Medications: Current Outpatient Medications  Medication Sig Dispense Refill  . alendronate (FOSAMAX) 70 MG tablet Take 70 mg by mouth once a week. Every Sunday    . buPROPion (WELLBUTRIN XL) 300 MG 24 hr tablet Take 1 tablet (300  mg total) by mouth daily. 90 tablet 1  . celecoxib (CELEBREX) 200 MG capsule Take 200 mg by mouth daily. AM    . furosemide (LASIX) 20 MG tablet Take 20 mg by mouth as needed.    . gabapentin (NEURONTIN) 300 MG capsule Take 1 capsule (300 mg total) by mouth 2 (two) times daily. (Patient taking differently: Take 300 mg by mouth 2 (two) times daily. AM AND HS) 60 capsule 2  . glycopyrrolate (ROBINUL) 1 MG tablet Take 1 mg by mouth 2 (two) times daily. AM AND HS  3  . hydrOXYzine (ATARAX/VISTARIL) 10 MG tablet Take 1-2 tablets (10-20 mg total) by mouth at bedtime as needed for anxiety. 180 tablet 1  . ibuprofen (ADVIL,MOTRIN) 200 MG tablet Take 400 mg by mouth daily as needed for headache or moderate pain.    Marland Kitchen levorphanol (LEVODROMORAN) 2 MG tablet 3 mg in am, 2 mg in afternoon, and 2mg  evening For chronic pain To last 30 days from fill date Fill on or after: 11/06/17, 12/06/17, 01/05/18 105 tablet 0  . levothyroxine (SYNTHROID, LEVOTHROID) 25 MCG tablet Take 25 mcg by mouth once daily before breakfast  0  . lipase/protease/amylase (CREON) 36000 UNITS CPEP capsule Take 36,000 Units by mouth 3 (three) times daily with meals.    Marland Kitchen loratadine (CLARITIN) 10 MG tablet Take 10 mg by mouth daily as needed for allergies.    . metoprolol succinate (TOPROL-XL) 100 MG 24 hr tablet Take 100 mg by mouth once daily in the evening  0  . ondansetron (ZOFRAN-ODT) 8 MG disintegrating tablet Take 8 mg by mouth daily as needed for nausea or  vomiting.    . pantoprazole (PROTONIX) 40 MG tablet Take 40 mg by mouth once daily in the evening    . predniSONE (DELTASONE) 5 MG tablet Take 4 mg by mouth daily.    . traZODone (DESYREL) 100 MG tablet Take 1-2 tablets (100-200 mg total) by mouth at bedtime as needed for sleep. (Patient taking differently: Take 50 mg by mouth at bedtime. ) 180 tablet 1  . vitamin B-12 (CYANOCOBALAMIN) 1000 MCG tablet Take 1,000 mcg by mouth daily.    . Vitamin D, Ergocalciferol, (DRISDOL) 50000 units CAPS capsule Take one capsule once a week for 12 weeks     No current facility-administered medications for this visit.      Musculoskeletal: Strength & Muscle Tone: within normal limits Gait & Station: normal Patient leans: N/A  Psychiatric Specialty Exam: Review of Systems  Psychiatric/Behavioral: Positive for depression.  All other systems reviewed and are negative.   Blood pressure (!) 155/68, pulse (!) 59, temperature 98.1 F (36.7 C), temperature source Oral, weight 190 lb 12.8 oz (86.5 kg).Body mass index is 37.26 kg/m.  General Appearance: Casual  Eye Contact:  Fair  Speech:  Normal Rate  Volume:  Normal  Mood:  Dysphoric  Affect:  Congruent  Thought Process:  Goal Directed and Descriptions of Associations: Intact  Orientation:  Full (Time, Place, and Person)  Thought Content: Logical   Suicidal Thoughts:  No  Homicidal Thoughts:  No  Memory:  Immediate;   Fair Recent;   Fair Remote;   Fair  Judgement:  Fair  Insight:  Fair  Psychomotor Activity:  Normal  Concentration:  Concentration: Fair and Attention Span: Fair  Recall:  [employer] of Knowledge: Fair  Language: Fair  Akathisia:  No  Handed:  Right  AIMS (if indicated): na  Assets:  Armed forces logistics/support/administrative officer Social Support  ADL's:  Intact  Cognition: WNL  Sleep:  Fair   Screenings: PHQ2-9     Clinical Support from 11/05/2017 in [facility] Procedure visit from 08/19/2017 in [facility] Clinical Support from 08/04/2017 in [place] Clinical Support from 06/04/2017 in [facility] Clinical Support from 05/11/2017 in [facility]  PHQ-2 Total Score  0  0  0  0  5  PHQ-9 Total Score  -  -  -  -  13       Assessment and Plan: [name] is a 61 year old Caucasian female who has a history of depression, chronic pain, GERD, hypothyroidism, hypertension, fibromyalgia, presented to the clinic today for a follow-up visit.  Patient currently is going through bereavement.  Her mother just passed away less than a month ago.  Patient otherwise is doing well on the medications.  We will continue plan as noted below.  Plan MDD Continue Wellbutrin XL 300 mg p.o. daily Continue CBT with Ms. Peacock.  We will discuss with Sunday Spillers to schedule a sooner appointment with Ms. Peacock since patient is currently going through her mother's death.  Bereavement Patient will start psychotherapy/grief therapy.  Insomnia Trazodone 100-200 mg p.o. nightly as needed Vistaril 10-20 mg p.o. nightly as needed.  Discussed elevated blood pressure this visit with patient.  Patient to follow-up with her PCP.  Follow-up in clinic in 2 months.  More than 50 % of the time was spent for psychoeducation and supportive psychotherapy and care coordination.  This note was generated in part or whole with voice recognition software. Voice recognition is usually quite accurate but there are transcription errors that can and very often do occur. I apologize for any typographical errors that were not detected and corrected.        Ursula Alert, MD 11/30/2017, 3:03 PM

## 2017-12-02 DIAGNOSIS — M810 Age-related osteoporosis without current pathological fracture: Secondary | ICD-10-CM | POA: Insufficient documentation

## 2017-12-04 ENCOUNTER — Telehealth: Payer: Self-pay | Admitting: Student in an Organized Health Care Education/Training Program

## 2017-12-04 NOTE — Telephone Encounter (Signed)
Patient states she did not get gabapentin called in when she came for visit. Would like to get this called in

## 2017-12-04 NOTE — Telephone Encounter (Signed)
Voicemail left with patient to see if she is just now running out of medications that were prescribed 08/05/17 with 2 refills.  Asked patient to please call and confirm how she is taking medication.  If medication were filled on the prescribing date and taking as prescribed medications would have run out on 11/03/17.  Patient returned call and states that she has had some difficulty getting in touch with our office and her messages returned.  States that in fact she did run out of gabapentin in July, however her husband had this same Rx and she has been using his.  States she has enough to last x 1 week.  Explained Dr Holley Raring will be back in office on Monday and I would put this message through to him.  Patient verbalizes u/o information.

## 2017-12-07 MED ORDER — GABAPENTIN 300 MG PO CAPS
300.0000 mg | ORAL_CAPSULE | Freq: Two times a day (BID) | ORAL | 2 refills | Status: DC
Start: 2017-12-07 — End: 2018-03-15

## 2017-12-09 ENCOUNTER — Ambulatory Visit: Payer: Self-pay | Admitting: Licensed Clinical Social Worker

## 2018-01-04 ENCOUNTER — Ambulatory Visit (INDEPENDENT_AMBULATORY_CARE_PROVIDER_SITE_OTHER): Payer: 59 | Admitting: Licensed Clinical Social Worker

## 2018-01-04 DIAGNOSIS — F331 Major depressive disorder, recurrent, moderate: Secondary | ICD-10-CM

## 2018-01-07 NOTE — Progress Notes (Signed)
   THERAPIST PROGRESS NOTE  Session Time: 67mn  Participation Level: Active  Behavioral Response: CasualAlertEuthymic  Type of Therapy: Individual Therapy  Treatment Goals addressed: Coping  Interventions: CBT and Motivational Interviewing  Summary: JSigourney Portillois a 62y.o. female who presents with continued symptoms of diagnosis.  Therapist met with Patient in an outpatient setting to assess current mood and assist with making progress towards goals through the use of therapeutic intervention. Therapist did a brief mood check, assessing anger, fear, disgust, excitement, happiness, and sadness. Patient reports a "good" mood. Provided support for Patient as she discussed her current mood. Stressed the importance of mood stabilization through learned coping skills and medication management.  Encouraged Patient to focus on her strengths and the positive aspects.      Suicidal/Homicidal: No   Plan: Return again in 2 weeks.  Diagnosis: Axis I: Depression    Axis II: No diagnosis    NLubertha South LCSW 01/04/2018

## 2018-01-27 ENCOUNTER — Encounter: Payer: Self-pay | Admitting: Student in an Organized Health Care Education/Training Program

## 2018-01-27 ENCOUNTER — Ambulatory Visit
Payer: Managed Care, Other (non HMO) | Attending: Student in an Organized Health Care Education/Training Program | Admitting: Student in an Organized Health Care Education/Training Program

## 2018-01-27 ENCOUNTER — Other Ambulatory Visit: Payer: Self-pay

## 2018-01-27 VITALS — BP 139/65 | HR 54 | Temp 98.1°F | Resp 16 | Ht 60.0 in | Wt 182.0 lb

## 2018-01-27 DIAGNOSIS — Z841 Family history of disorders of kidney and ureter: Secondary | ICD-10-CM | POA: Insufficient documentation

## 2018-01-27 DIAGNOSIS — F329 Major depressive disorder, single episode, unspecified: Secondary | ICD-10-CM | POA: Diagnosis not present

## 2018-01-27 DIAGNOSIS — E039 Hypothyroidism, unspecified: Secondary | ICD-10-CM | POA: Insufficient documentation

## 2018-01-27 DIAGNOSIS — Z818 Family history of other mental and behavioral disorders: Secondary | ICD-10-CM | POA: Diagnosis not present

## 2018-01-27 DIAGNOSIS — M47816 Spondylosis without myelopathy or radiculopathy, lumbar region: Secondary | ICD-10-CM | POA: Diagnosis not present

## 2018-01-27 DIAGNOSIS — K219 Gastro-esophageal reflux disease without esophagitis: Secondary | ICD-10-CM | POA: Insufficient documentation

## 2018-01-27 DIAGNOSIS — Z79890 Hormone replacement therapy (postmenopausal): Secondary | ICD-10-CM | POA: Insufficient documentation

## 2018-01-27 DIAGNOSIS — Z803 Family history of malignant neoplasm of breast: Secondary | ICD-10-CM | POA: Insufficient documentation

## 2018-01-27 DIAGNOSIS — Z7952 Long term (current) use of systemic steroids: Secondary | ICD-10-CM | POA: Insufficient documentation

## 2018-01-27 DIAGNOSIS — G894 Chronic pain syndrome: Secondary | ICD-10-CM | POA: Insufficient documentation

## 2018-01-27 DIAGNOSIS — I10 Essential (primary) hypertension: Secondary | ICD-10-CM | POA: Diagnosis not present

## 2018-01-27 DIAGNOSIS — F419 Anxiety disorder, unspecified: Secondary | ICD-10-CM | POA: Diagnosis not present

## 2018-01-27 DIAGNOSIS — Z7983 Long term (current) use of bisphosphonates: Secondary | ICD-10-CM | POA: Insufficient documentation

## 2018-01-27 DIAGNOSIS — Z87891 Personal history of nicotine dependence: Secondary | ICD-10-CM | POA: Insufficient documentation

## 2018-01-27 DIAGNOSIS — Z981 Arthrodesis status: Secondary | ICD-10-CM | POA: Insufficient documentation

## 2018-01-27 DIAGNOSIS — E559 Vitamin D deficiency, unspecified: Secondary | ICD-10-CM | POA: Insufficient documentation

## 2018-01-27 DIAGNOSIS — M961 Postlaminectomy syndrome, not elsewhere classified: Secondary | ICD-10-CM | POA: Insufficient documentation

## 2018-01-27 DIAGNOSIS — Z90710 Acquired absence of both cervix and uterus: Secondary | ICD-10-CM | POA: Insufficient documentation

## 2018-01-27 DIAGNOSIS — Z79899 Other long term (current) drug therapy: Secondary | ICD-10-CM | POA: Diagnosis not present

## 2018-01-27 DIAGNOSIS — M810 Age-related osteoporosis without current pathological fracture: Secondary | ICD-10-CM | POA: Diagnosis not present

## 2018-01-27 DIAGNOSIS — M542 Cervicalgia: Secondary | ICD-10-CM

## 2018-01-27 DIAGNOSIS — M5030 Other cervical disc degeneration, unspecified cervical region: Secondary | ICD-10-CM | POA: Insufficient documentation

## 2018-01-27 DIAGNOSIS — G629 Polyneuropathy, unspecified: Secondary | ICD-10-CM | POA: Insufficient documentation

## 2018-01-27 DIAGNOSIS — Z8249 Family history of ischemic heart disease and other diseases of the circulatory system: Secondary | ICD-10-CM | POA: Insufficient documentation

## 2018-01-27 DIAGNOSIS — Q782 Osteopetrosis: Secondary | ICD-10-CM | POA: Insufficient documentation

## 2018-01-27 DIAGNOSIS — M797 Fibromyalgia: Secondary | ICD-10-CM | POA: Diagnosis not present

## 2018-01-27 DIAGNOSIS — Z9049 Acquired absence of other specified parts of digestive tract: Secondary | ICD-10-CM | POA: Insufficient documentation

## 2018-01-27 DIAGNOSIS — M545 Low back pain: Secondary | ICD-10-CM | POA: Diagnosis present

## 2018-01-27 DIAGNOSIS — M47812 Spondylosis without myelopathy or radiculopathy, cervical region: Secondary | ICD-10-CM | POA: Diagnosis not present

## 2018-01-27 DIAGNOSIS — Z9689 Presence of other specified functional implants: Secondary | ICD-10-CM

## 2018-01-27 DIAGNOSIS — Z9884 Bariatric surgery status: Secondary | ICD-10-CM | POA: Insufficient documentation

## 2018-01-27 DIAGNOSIS — M5136 Other intervertebral disc degeneration, lumbar region: Secondary | ICD-10-CM | POA: Diagnosis not present

## 2018-01-27 DIAGNOSIS — Z9841 Cataract extraction status, right eye: Secondary | ICD-10-CM | POA: Insufficient documentation

## 2018-01-27 DIAGNOSIS — F32A Depression, unspecified: Secondary | ICD-10-CM

## 2018-01-27 MED ORDER — LEVORPHANOL TARTRATE 2 MG PO TABS: ORAL_TABLET | ORAL | 0 refills | Status: DC

## 2018-01-27 NOTE — Progress Notes (Signed)
Patient's Name: Connie [name]  MRN: [id number]  Referring Provider: Tracie Harrier, MD  DOB: [birth year]  PCP: Tracie Harrier, MD  DOS: 01/27/2018  Note by: [name], MD  Service setting: Ambulatory outpatient  Specialty: Interventional Pain Management  Location: [facility] (AMB) Pain Management Facility    Patient type: Established   Primary Reason(s) for Visit: Encounter for prescription drug management. (Level of risk: moderate)  CC: Back Pain (lower)  HPI  Connie [name] is a 62 y.o. year old, female patient, who comes today for a medication management evaluation. She has Degenerative joint disease of cervical and lumbar spine; Lumbar spondylosis; History of lumbar fusion; Lumbar degenerative disc disease; Fibromyalgia; Cervicalgia; DDD (degenerative disc disease), cervical; Chronic pain syndrome; Major depressive disorder; Spinal cord stimulator status; Chiari malformation type I ([place]); Adiposis dolorosa; Acquired spondylolisthesis; Chronic constipation; Chronic midline [name] back pain; Chronic pain; Daytime somnolence; Delusions of parasitosis ([place]); Diastolic dysfunction; Dry eyes, bilateral; Dysphagia, oropharyngeal phase; Edema; GERD without esophagitis; Generalized osteoarthritis of multiple sites; Fluid collection at surgical site; Fatty liver; Fatigue; Essential hypertension; Internal hemorrhoid; Insomnia; Hypothyroidism; H/O gastric bypass; Seasonal allergies; Rotator cuff arthropathy of both shoulders; Osteoporosis; Obesity, morbid ([place]); Nonunion of fracture; Morgellons disease; Medication-induced delirium, acute, hyperactive ([place]); Lumbar pseudoarthrosis; Leg cramps; Iron deficiency; Joint pain; Spinal stenosis of lumbar region without neurogenic claudication; Arthritis; Complete tear of right rotator cuff; OA (osteoarthritis); Rotator cuff tendinitis, right; Lumbar post-laminectomy syndrome; Lumbar radiculopathy; Cervical radiculopathy; Bilateral hand pain; Candidiasis of skin and nails;  CRP elevated; Jaw pain, non-TMJ; Lumbosacral radiculitis; Lumbosacral spondylosis without myelopathy; Pain in the coccyx; Menopausal symptom; Temporal headache; Stiffness of shoulder joint; SOBOE (shortness of breath on exertion); Screening for osteoporosis; and Vitamin D deficiency, unspecified on their problem list. Her primarily concern today is the Back Pain (lower)  Pain Assessment: Location: Upper Back Radiating: left hip and groin Onset: More than a month ago Duration: Chronic pain Quality: Stabbing, Sharp Severity: 4 /10 (subjective, self-reported pain score)  Note: Reported level is compatible with observation.                         When using our objective Pain Scale, levels between 6 and 10/10 are said to belong in an emergency room, as it progressively worsens from a 6/10, described as severely limiting, requiring emergency care not usually available at an outpatient pain management facility. At a 6/10 level, communication becomes difficult and requires great effort. Assistance to reach the emergency department may be required. Facial flushing and profuse sweating along with potentially dangerous increases in heart rate and blood pressure will be evident. Effect on ADL:   Timing: Constant Modifying factors: medications BP: 139/65  HR: (!) 54  Connie [name] was last scheduled for an appointment on 12/04/2017 for medication management. During today's appointment we reviewed Connie [name]'s chronic pain status, as well as her outpatient medication regimen.  The patient  reports that she does not use drugs. Her body mass index is 35.54 kg/m.  Further details on both, my assessment(s), as well as the proposed treatment plan, please see below.  Controlled Substance Pharmacotherapy Assessment REMS (Risk Evaluation and Mitigation Strategy)  Analgesic:Levorphanol3 Mg, 61m 2 mg, daily, quantity 105, MME equals 77 MME/day:782mday.  WhLandis MartinsRN  01/27/2018  2:37 PM  Sign at close  encounter Nursing Pain Medication Assessment:  Safety precautions to be maintained throughout the outpatient stay will include: orient to surroundings, keep bed in [name] position, maintain call bell within  reach at all times, provide assistance with transfer out of bed and ambulation.  Medication Inspection Compliance: Pill count conducted under aseptic conditions, in front of the patient. Neither the pills nor the bottle was removed from the patient's sight at any time. Once count was completed pills were immediately returned to the patient in their original bottle. Levorphanol Medication: See above Pill/Patch Count: 36 of 105 pills remain Pill/Patch Appearance: Markings consistent with prescribed medication Bottle Appearance: Standard pharmacy container. Clearly labeled. Filled Date: 09/03 / 2019 Last Medication intake:  Today Pharmacokinetics: Liberation and absorption (onset of action): WNL Distribution (time to peak effect): WNL Metabolism and excretion (duration of action): WNL         Pharmacodynamics: Desired effects: Analgesia: Connie [name] reports >50% benefit. Functional ability: Patient reports that medication allows her to accomplish basic ADLs Clinically meaningful improvement in function (CMIF): Sustained CMIF goals met Perceived effectiveness: Described as relatively effective, allowing for increase in activities of daily living (ADL) Undesirable effects: Side-effects or Adverse reactions: None reported Monitoring: [place] PMP: Online review of the past 59-monthperiod conducted. Compliant with practice rules and regulations Last UDS on record: Summary  Date Value Ref Range Status  06/04/2017 FINAL  Final    Comment:    ==================================================================== TOXASSURE SELECT 13 (MW) ==================================================================== Specimen Alert Note:  Urinary creatinine is [name]; ability to detect some drugs may be  compromised.  Interpret results with caution. ==================================================================== Test                             Result       Flag       Units   NO DRUGS DETECTED. ==================================================================== Test                      Result    Flag   Units      Ref Range   Creatinine              16        L      mg/dL      >=20 ==================================================================== Declared Medications:  The flagging and interpretation on this report are based on the  following declared medications.  Unexpected results may arise from  inaccuracies in the declared medications.  **Note: The testing scope of this panel does not include following  reported medications:  Bupropion  Celecoxib  Diphenhydramine (Benadryl)  Fluticasone (Flonase)  Furosemide (Lasix)  Gabapentin  Glycopyrrolate  Ibuprofen  Levorphanol  Loratadine  Metoprolol  Ondansetron  Pantoprazole ==================================================================== For clinical consultation, please call [phone] ====================================================================    UDS interpretation: Compliant          Medication Assessment Form: Reviewed. Patient indicates being compliant with therapy Treatment compliance: Compliant Risk Assessment Profile: Aberrant behavior: See prior evaluations. None observed or detected today Comorbid factors increasing risk of overdose: See prior notes. No additional risks detected today Opioid risk tool (ORT) (Total Score): 2 Personal History of Substance Abuse (SUD-Substance use disorder):  Alcohol: Negative  Illegal Drugs: Negative  Rx Drugs: Negative  ORT Risk Level calculation: [name] Risk Risk of substance use disorder (SUD): [name] Opioid Risk Tool - 01/27/18 1435      Family History of Substance Abuse   Alcohol  Positive Female    Illegal Drugs  Negative    Rx Drugs  Negative       Personal History of Substance Abuse   Alcohol  Negative  Illegal Drugs  Negative    Rx Drugs  Negative      Age   Age between 4-45 years   No      History of Preadolescent Sexual Abuse   History of Preadolescent Sexual Abuse  Negative or Female      Psychological Disease   Psychological Disease  Negative    Depression  Positive      Total Score   Opioid Risk Tool Scoring  2    Opioid Risk Interpretation  [name] Risk      ORT Scoring interpretation table:  Score <3 = [name] Risk for SUD  Score between 4-7 = Moderate Risk for SUD  Score >8 = High Risk for Opioid Abuse   Risk Mitigation Strategies:  Patient Counseling: Covered Patient-Prescriber Agreement (PPA): Present and active  Notification to other healthcare providers: Done  Pharmacologic Plan: No change in therapy, at this time.             Laboratory Chemistry  Inflammation Markers (CRP: Acute Phase) (ESR: Chronic Phase) No results found for: CRP, ESRSEDRATE, LATICACIDVEN                       Rheumatology Markers No results found for: RF, ANA, LABURIC, URICUR, LYMEIGGIGMAB, LYMEABIGMQN, HLAB27                      Renal Function Markers Lab Results  Component Value Date   BUN 18 06/23/2017   CREATININE 0.60 06/23/2017   GFRAA >60 06/23/2017   GFRNONAA >60 06/23/2017                             Hepatic Function Markers Lab Results  Component Value Date   AST 20 06/23/2017   ALT 9 (L) 06/23/2017   ALBUMIN 3.6 06/23/2017   ALKPHOS 88 06/23/2017                        Electrolytes Lab Results  Component Value Date   NA 135 06/23/2017   K 4.9 06/23/2017   CL 102 06/23/2017   CALCIUM 8.5 (L) 06/23/2017                        Neuropathy Markers No results found for: VITAMINB12, FOLATE, HGBA1C, HIV                      CNS Tests No results found for: COLORCSF, APPEARCSF, RBCCOUNTCSF, WBCCSF, POLYSCSF, LYMPHSCSF, EOSCSF, PROTEINCSF, GLUCCSF, JCVIRUS, CSFOLI, IGGCSF                      Bone Pathology  Markers No results found for: VD25OH, [id number], ZW2585ID7, OE4235TI1, 25OHVITD1, 25OHVITD2, 25OHVITD3, TESTOFREE, TESTOSTERONE                       Coagulation Parameters Lab Results  Component Value Date   PLT 211 06/23/2017                        Cardiovascular Markers Lab Results  Component Value Date   HGB 11.8 (L) 06/23/2017   HCT 36.1 06/23/2017                         CA Markers No results found for: CEA, CA125,  LABCA2                      Note: Lab results reviewed.  Recent Diagnostic Imaging Results  DG C-Arm 1-60 Min-No Report Fluoroscopy was utilized by the requesting physician.  No radiographic  interpretation.   Complexity Note: Imaging results reviewed. Results shared with Connie [name], using Layman's terms.                         Meds   Current Outpatient Medications:  .  alendronate (FOSAMAX) 70 MG tablet, Take 70 mg by mouth once a week. Every Sunday, Disp: , Rfl:  .  buPROPion (WELLBUTRIN XL) 300 MG 24 hr tablet, Take 1 tablet (300 mg total) by mouth daily., Disp: 90 tablet, Rfl: 1 .  celecoxib (CELEBREX) 200 MG capsule, Take 200 mg by mouth daily. AM, Disp: , Rfl:  .  gabapentin (NEURONTIN) 300 MG capsule, Take 1 capsule (300 mg total) by mouth 2 (two) times daily., Disp: 60 capsule, Rfl: 2 .  glycopyrrolate (ROBINUL) 1 MG tablet, Take 1 mg by mouth 2 (two) times daily. AM AND HS, Disp: , Rfl: 3 .  hydrOXYzine (ATARAX/VISTARIL) 10 MG tablet, Take 1-2 tablets (10-20 mg total) by mouth at bedtime as needed for anxiety., Disp: 180 tablet, Rfl: 1 .  levorphanol (LEVODROMORAN) 2 MG tablet, 3 mg in am, 2 mg in afternoon, and 1m evening For chronic pain To last 30 days from fill date Fill on or after: 02/03/2018, 03/05/2018, 04/03/2018, Disp: 105 tablet, Rfl: 0 .  levothyroxine (SYNTHROID, LEVOTHROID) 25 MCG tablet, Take 25 mcg by mouth once daily before breakfast, Disp: , Rfl: 0 .  lipase/protease/amylase (CREON) 36000 UNITS CPEP capsule, Take 36,000 Units by  mouth 3 (three) times daily with meals., Disp: , Rfl:  .  loratadine (CLARITIN) 10 MG tablet, Take 10 mg by mouth daily as needed for allergies., Disp: , Rfl:  .  metoprolol succinate (TOPROL-XL) 100 MG 24 hr tablet, Take 100 mg by mouth once daily in the evening, Disp: , Rfl: 0 .  ondansetron (ZOFRAN-ODT) 8 MG disintegrating tablet, Take 8 mg by mouth daily as needed for nausea or vomiting., Disp: , Rfl:  .  pantoprazole (PROTONIX) 40 MG tablet, Take 40 mg by mouth once daily in the evening, Disp: , Rfl:  .  predniSONE (DELTASONE) 5 MG tablet, Take 4 mg by mouth daily., Disp: , Rfl:  .  traZODone (DESYREL) 100 MG tablet, Take 1-2 tablets (100-200 mg total) by mouth at bedtime as needed for sleep. (Patient taking differently: Take 50 mg by mouth at bedtime. ), Disp: 180 tablet, Rfl: 1 .  furosemide (LASIX) 20 MG tablet, Take 20 mg by mouth as needed., Disp: , Rfl:  .  ibuprofen (ADVIL,MOTRIN) 200 MG tablet, Take 400 mg by mouth daily as needed for headache or moderate pain., Disp: , Rfl:  .  vitamin B-12 (CYANOCOBALAMIN) 1000 MCG tablet, Take 1,000 mcg by mouth daily., Disp: , Rfl:  .  Vitamin D, Ergocalciferol, (DRISDOL) 50000 units CAPS capsule, Take one capsule once a week for 12 weeks, Disp: , Rfl:   ROS  Constitutional: Denies any fever or chills Gastrointestinal: No reported hemesis, hematochezia, vomiting, or acute GI distress Musculoskeletal: Denies any acute onset joint swelling, redness, loss of ROM, or weakness Neurological: No reported episodes of acute onset apraxia, aphasia, dysarthria, agnosia, amnesia, paralysis, loss of coordination, or loss of consciousness  Allergies  Connie [name]  is allergic to tape.  PFSH  Drug: Connie [name]  reports that she does not use drugs. Alcohol:  reports that she does not drink alcohol. Tobacco:  reports that she quit smoking about 10 years ago. Her smoking use included cigarettes. She has a 10.00 pack-year smoking history. She has never used  smokeless tobacco. Medical:  has a past medical history of Anemia, Anxiety, Arthritis, Cervical spondylosis with myelopathy (2018), Chronic pain (2019), DDD (degenerative disc disease), lumbar, Depression, Displacement of cervical intervertebral disc (2018), Dyspnea, Fibromyalgia, GERD (gastroesophageal reflux disease), Heart murmur, Hypertension, Hypothyroidism, Lumbar post-laminectomy syndrome (2018), Lumbosacral radiculitis (2018), Neuropathy due to medical condition ([place]), Osteoarthritis, Osteoporosis, Other long term (current) drug therapy (2019), Pain in the coccyx (2018), Spondylolisthesis (2018), Spondylosis of lumbosacral region without myelopathy or radiculopathy (2018), Spondylosis with myelopathy, lumbar region (2018), and Vitamin D deficiency. Surgical: Connie [name]  has a past surgical history that includes Cesarean section; Gastric bypass (2004); Lumbar fusion (2016); Spinal cord stimulator implant (2018); Cholecystectomy; Appendectomy; Tonsillectomy; Shoulder arthroscopy with rotator cuff repair (Right, 2008); Joint replacement (Bilateral, 2005); Abdominal hysterectomy (2000); epidural injection (2018); arm surgery (2002); Breast biopsy (Left, 2005); Colonoscopy with propofol (N/A, 08/25/2017); Temporal artery biopsy / ligation; and Cataract extraction w/PHACO (Right, 10/21/2017). Family: family history includes Anxiety disorder in her mother; Breast cancer in her cousin, maternal aunt, and mother; Cancer in her brother, father, and mother; Depression in her mother; Hypertension in her father; Kidney disease in her mother.  Constitutional Exam  General appearance: Well nourished, well developed, and well hydrated. In no apparent acute distress Vitals:   01/27/18 1428  BP: 139/65  Pulse: (!) 54  Resp: 16  Temp: 98.1 F (36.7 C)  TempSrc: Oral  SpO2: 97%  Weight: 182 lb (82.6 kg)  Height: 5' (1.524 m)   BMI Assessment: Estimated body mass index is 35.54 kg/m as calculated from the  following:   Height as of this encounter: 5' (1.524 m).   Weight as of this encounter: 182 lb (82.6 kg).  BMI interpretation table: BMI level Category Range association with higher incidence of chronic pain  <18 kg/m2 Underweight   18.5-24.9 kg/m2 Ideal body weight   25-29.9 kg/m2 Overweight Increased incidence by 20%  30-34.9 kg/m2 Obese (Class I) Increased incidence by 68%  35-39.9 kg/m2 Severe obesity (Class II) Increased incidence by 136%  >40 kg/m2 Extreme obesity (Class III) Increased incidence by 254%   Patient's current BMI Ideal Body weight  Body mass index is 35.54 kg/m. Ideal body weight: 45.5 kg (100 lb 4.9 oz) Adjusted ideal body weight: 60.3 kg (132 lb 15.8 oz)   BMI Readings from Last 4 Encounters:  01/27/18 35.54 kg/m  11/05/17 38.47 kg/m  10/21/17 38.67 kg/m  09/17/17 38.73 kg/m   Wt Readings from Last 4 Encounters:  01/27/18 182 lb (82.6 kg)  11/05/17 197 lb (89.4 kg)  10/21/17 198 lb (89.8 kg)  09/17/17 205 lb (93 kg)  Psych/Mental status: Alert, oriented x 3 (person, place, & time)       Eyes: PERLA Respiratory: No evidence of acute respiratory distress  Cervical Spine Area Exam  Skin & Axial Inspection: No masses, redness, edema, swelling, or associated skin lesions Alignment: Symmetrical Functional ROM: Unrestricted ROM      Stability: No instability detected Muscle Tone/Strength: Functionally intact. No obvious neuro-muscular anomalies detected. Sensory (Neurological): Unimpaired Palpation: No palpable anomalies              Upper Extremity (UE) Exam    Side:  Right upper extremity  Side: Left upper extremity  Skin & Extremity Inspection: Skin color, temperature, and hair growth are WNL. No peripheral edema or cyanosis. No masses, redness, swelling, asymmetry, or associated skin lesions. No contractures.  Skin & Extremity Inspection: Skin color, temperature, and hair growth are WNL. No peripheral edema or cyanosis. No masses, redness, swelling,  asymmetry, or associated skin lesions. No contractures.  Functional ROM: Unrestricted ROM          Functional ROM: Unrestricted ROM          Muscle Tone/Strength: Functionally intact. No obvious neuro-muscular anomalies detected.  Muscle Tone/Strength: Functionally intact. No obvious neuro-muscular anomalies detected.  Sensory (Neurological): Unimpaired          Sensory (Neurological): Unimpaired          Palpation: No palpable anomalies              Palpation: No palpable anomalies              Provocative Test(s):  Phalen's test: deferred Tinel's test: deferred Apley's scratch test (touch opposite shoulder):  Action 1 (Across chest): deferred Action 2 (Overhead): deferred Action 3 (LB reach): deferred   Provocative Test(s):  Phalen's test: deferred Tinel's test: deferred Apley's scratch test (touch opposite shoulder):  Action 1 (Across chest): deferred Action 2 (Overhead): deferred Action 3 (LB reach): deferred    Thoracic Spine Area Exam  Skin & Axial Inspection: No masses, redness, or swelling Alignment: Symmetrical Functional ROM: Unrestricted ROM Stability: No instability detected Muscle Tone/Strength: Functionally intact. No obvious neuro-muscular anomalies detected. Sensory (Neurological): Unimpaired Muscle strength & Tone: No palpable anomalies  Lumbar Spine Area Exam  Skin & Axial Inspection: Well healed scar from previous spine surgery detected Alignment: Symmetrical Functional ROM: Decreased ROM       Stability: No instability detected Muscle Tone/Strength: Functionally intact. No obvious neuro-muscular anomalies detected. Sensory (Neurological): Musculoskeletal pain pattern and dermatomal Palpation: No palpable anomalies       Provocative Tests: Hyperextension/rotation test: deferred today       Lumbar quadrant test (Kemp's test): deferred today       Lateral bending test: deferred today       Patrick's Maneuver: deferred today                   FABER test:  deferred today                   S-I anterior distraction/compression test: deferred today         S-I lateral compression test: deferred today         S-I Thigh-thrust test: deferred today         S-I Gaenslen's test: deferred today          Gait & Posture Assessment  Ambulation: Patient ambulates using a walker Gait: Limited. Using assistive device to ambulate Posture: Difficulty standing up straight, due to pain   Lower Extremity Exam    Side: Right lower extremity  Side: Left lower extremity  Stability: No instability observed          Stability: No instability observed          Skin & Extremity Inspection: Skin color, temperature, and hair growth are WNL. No peripheral edema or cyanosis. No masses, redness, swelling, asymmetry, or associated skin lesions. No contractures.  Skin & Extremity Inspection: Skin color, temperature, and hair growth are WNL. No peripheral edema or cyanosis. No masses, redness, swelling, asymmetry, or associated skin  lesions. No contractures.  Functional ROM: Unrestricted ROM                  Functional ROM: Unrestricted ROM                  Muscle Tone/Strength: Functionally intact. No obvious neuro-muscular anomalies detected.  Muscle Tone/Strength: Functionally intact. No obvious neuro-muscular anomalies detected.  Sensory (Neurological): Musculoskeletal pain pattern  Sensory (Neurological): Musculoskeletal pain pattern  Palpation: No palpable anomalies  Palpation: No palpable anomalies   Assessment  Primary Diagnosis & Pertinent Problem List: The primary encounter diagnosis was Spondylosis of cervical region without myelopathy or radiculopathy. Diagnoses of Cervical facet joint syndrome (L>R, C4/5/6/7), Chronic pain syndrome, History of lumbar fusion, Lumbar degenerative disc disease, Lumbar spondylosis, Fibromyalgia, DDD (degenerative disc disease), cervical, Major depressive disorder, remission status unspecified, unspecified whether recurrent, Spinal cord  stimulator status, Depression, unspecified depression type, and Cervicalgia were also pertinent to this visit.  Status Diagnosis  Controlled Controlled Controlled 1. Spondylosis of cervical region without myelopathy or radiculopathy   2. Cervical facet joint syndrome (L>R, C4/5/6/7)   3. Chronic pain syndrome   4. History of lumbar fusion   5. Lumbar degenerative disc disease   6. Lumbar spondylosis   7. Fibromyalgia   8. DDD (degenerative disc disease), cervical   9. Major depressive disorder, remission status unspecified, unspecified whether recurrent   10. Spinal cord stimulator status   11. Depression, unspecified depression type   12. Cervicalgia      General Recommendations: The pain condition that the patient suffers from is best treated with a multidisciplinary approach that involves an increase in physical activity to prevent de-conditioning and worsening of the pain cycle, as well as psychological counseling (formal and/or informal) to address the co-morbid psychological affects of pain. Treatment will often involve judicious use of pain medications and interventional procedures to decrease the pain, allowing the patient to participate in the physical activity that will ultimately produce long-lasting pain reductions. The goal of the multidisciplinary approach is to return the patient to a higher level of overall function and to restore their ability to perform activities of daily living.  62 year old female with chronic pain syndrome secondary to lumbar degenerative disc disease, lumbar radiculopathy, lumbar spondylosis status post L3-L5 lumbar fusion who presents with neck pain that radiates into bilateral shoulders and bilateral upper extremities. She is here for medication refill. Upon chart review from neurosurgery note, patient's MRI from 2016 showed mild compression at C5-C6 and stenosis at C6/7. Patient's CT myelogram reveals left disc osteophyte complex with mild central  canal stenosis at C5-6, facet hypertrophy and cervical spondylosis at C4/5/6/7, left greater than right.   Patient is status post diagnostic bilateral C4, 5, 6, 7 cervical facet medial branch nerve blocks (4/25) which were effective for pain.  We can consider repeating bilateral cervical facet medial branch nerve blocks in the future.  Otherwise I will refill the patient's levorphanol as below ([place] PMP checked and appropriate, UDS to date).  She is instructed to continue her gabapentin as prescribed and does not need any refills.  Plan: -Refill levorphanol  at previous dose below -Continue gabapentin as prescribed, no refills needed -Can consider repeat cervical facet medial branch nerve block #2, bilateral at C4, 5, 6, 7 for cervical spondylosis and facet arthropathy if neck pain worsens and if patient wants to repeat (offered patient repeat blocks but she wants to hold off at this time).    Plan of Care  Pharmacotherapy (  Medications Ordered): Meds ordered this encounter  Medications  . DISCONTD: levorphanol (LEVODROMORAN) 2 MG tablet    Sig: 3 mg in am, 2 mg in afternoon, and 70m evening For chronic pain To last 30 days from fill date Fill on or after: 02/03/2018, 03/05/2018, 04/03/2018    Dispense:  105 tablet    Refill:  0  . DISCONTD: levorphanol (LEVODROMORAN) 2 MG tablet    Sig: 3 mg in am, 2 mg in afternoon, and 260mevening For chronic pain To last 30 days from fill date Fill on or after: 02/03/2018, 03/05/2018, 04/03/2018    Dispense:  105 tablet    Refill:  0  . levorphanol (LEVODROMORAN) 2 MG tablet    Sig: 3 mg in am, 2 mg in afternoon, and 59m459mvening For chronic pain To last 30 days from fill date Fill on or after: 02/03/2018, 03/05/2018, 04/03/2018    Dispense:  105 tablet    Refill:  0   Provider-requested follow-up: Return in about 3 months (around 04/29/2018) for Medication Management.  Time Note: Greater than 50% of the 25 minute(s) of face-to-face time spent with Ms.  [name], was spent in counseling/coordination of care regarding: Ms. LowNoackimary cause of pain, the treatment plan, treatment alternatives, the risks and possible complications of proposed treatment, medication side effects, going over the informed consent, the opioid analgesic risks and possible complications, the results, interpretation and significance of  her recent diagnostic interventional treatment(s), the appropriate use of her medications, realistic expectations, the goals of pain management (increased in functionality), the medication agreement and the patient's responsibilities when it comes to controlled substances.  Future Appointments  Date Time Provider DepRogers1/10/2017  3:00 PM PeaLubertha [place] None  03/04/2018  4:00 PM EapUrsula AlertD [facility] None  04/29/2018 11:30 AM [facility]illis [facility]D [facility]    Primary Care Physician: HanTracie HarrierD [facility] Note by: BilGillis [name].D Date: 01/27/2018; Time: 3:01 PM  Patient Instructions  You have been given 3 Rx for levorphanol to last until 05/03/2018.

## 2018-01-27 NOTE — Patient Instructions (Signed)
You have been given 3 Rx for levorphanol to last until 05/03/2018.

## 2018-01-27 NOTE — Progress Notes (Signed)
Nursing Pain Medication Assessment:  Safety precautions to be maintained throughout the outpatient stay will include: orient to surroundings, keep bed in low position, maintain call bell within reach at all times, provide assistance with transfer out of bed and ambulation.  Medication Inspection Compliance: Pill count conducted under aseptic conditions, in front of the patient. Neither the pills nor the bottle was removed from the patient's sight at any time. Once count was completed pills were immediately returned to the patient in their original bottle. Levorphanol Medication: See above Pill/Patch Count: 36 of 105 pills remain Pill/Patch Appearance: Markings consistent with prescribed medication Bottle Appearance: Standard pharmacy container. Clearly labeled. Filled Date: 09/03 / 2019 Last Medication intake:  Today

## 2018-01-30 ENCOUNTER — Other Ambulatory Visit: Payer: Self-pay | Admitting: Psychiatry

## 2018-02-01 ENCOUNTER — Encounter: Payer: Managed Care, Other (non HMO) | Admitting: Student in an Organized Health Care Education/Training Program

## 2018-02-01 ENCOUNTER — Ambulatory Visit: Payer: 59 | Admitting: Licensed Clinical Social Worker

## 2018-02-01 ENCOUNTER — Ambulatory Visit: Payer: Self-pay | Admitting: Psychiatry

## 2018-03-02 ENCOUNTER — Other Ambulatory Visit: Payer: Self-pay | Admitting: Student in an Organized Health Care Education/Training Program

## 2018-03-04 ENCOUNTER — Ambulatory Visit: Payer: 59 | Admitting: Licensed Clinical Social Worker

## 2018-03-04 ENCOUNTER — Ambulatory Visit: Payer: 59 | Admitting: Psychiatry

## 2018-03-09 ENCOUNTER — Ambulatory Visit: Payer: 59 | Admitting: Psychiatry

## 2018-03-14 ENCOUNTER — Encounter: Payer: Self-pay | Admitting: Student in an Organized Health Care Education/Training Program

## 2018-03-15 MED ORDER — GABAPENTIN 300 MG PO CAPS: 300.0000 mg | ORAL_CAPSULE | Freq: Two times a day (BID) | ORAL | 5 refills | Status: DC

## 2018-03-16 ENCOUNTER — Other Ambulatory Visit: Payer: Self-pay

## 2018-03-16 ENCOUNTER — Ambulatory Visit (INDEPENDENT_AMBULATORY_CARE_PROVIDER_SITE_OTHER): Payer: 59 | Admitting: Psychiatry

## 2018-03-16 ENCOUNTER — Encounter: Payer: Self-pay | Admitting: Psychiatry

## 2018-03-16 VITALS — BP 146/86 | HR 65 | Temp 98.8°F | Wt 190.0 lb

## 2018-03-16 DIAGNOSIS — F419 Anxiety disorder, unspecified: Secondary | ICD-10-CM | POA: Diagnosis not present

## 2018-03-16 DIAGNOSIS — Z634 Disappearance and death of family member: Secondary | ICD-10-CM

## 2018-03-16 DIAGNOSIS — F331 Major depressive disorder, recurrent, moderate: Secondary | ICD-10-CM | POA: Diagnosis not present

## 2018-03-16 DIAGNOSIS — F5105 Insomnia due to other mental disorder: Secondary | ICD-10-CM

## 2018-03-16 DIAGNOSIS — R413 Other amnesia: Secondary | ICD-10-CM

## 2018-03-16 MED ORDER — DULOXETINE HCL 30 MG PO CPEP: 30.0000 mg | ORAL_CAPSULE | Freq: Every day | ORAL | 1 refills | Status: DC

## 2018-03-16 NOTE — Progress Notes (Signed)
[facility] MD OP Progress Note  03/16/2018 4:55 PM Connie [name]  MRN:  [id number]  Chief Complaint: 'I am here for follow up." Chief Complaint    Follow-up     HPI: Connie [name] is a 62 year old Caucasian female, married, on SSD, lives in [place], has a history of depression, history of delusional parasitosis, chronic pain, fibromyalgia, hypothyroidism, hypertension, polyarthralgia, presented to the clinic today for a follow-up visit.  Patient reports she continues to struggle with depression and anxiety symptoms.  She reports she has most recently been struggling with a lot of anxiety symptoms.  She feels overwhelmed all the time.  She reports she has physical limitations however has no help at home.  She reports her husband expects her to do a lot of chores around the house and she does not get any help.  She reports she also is grieving from her mother's death.  Patient reports she is tolerating the Wellbutrin well.  She denies any side effects.  Discussed restarting her on Cymbalta again.  She used to be on Cymbalta in the past and tolerated it well.  Patient agrees with plan.  Patient reports she also has been struggling with some memory problems mostly short-term memory loss.  Patient was given an MMSE today and she scored 29 out of 30.  Patient had vitamin B12 deficiency in the past which was replaced and her levels were within normal limits most recently.  Patient had recent TSH on 12/22/2017 which was within normal limits.  Discussed with patient to monitor her symptoms closely.  Patient denies any suicidality.  Patient denies any perceptual disturbances.  Patient denies any other concerns today. Visit Diagnosis:    ICD-10-CM   1. MDD (major depressive disorder), recurrent episode, moderate (HCC) F33.1   2. Insomnia due to mental disorder F51.05    improved  3. Anxiety disorder, unspecified type F41.9   4. Bereavement Z63.4   5. Memory loss R41.3     Past Psychiatric History: I have  reviewed past psychiatric history from my progress note on 08/06/2017.  Past trials of Latuda, Wellbutrin  Past Medical History:  Past Medical History:  Diagnosis Date  . Anemia    in the past  . Anxiety   . Arthritis    all over  . Cervical spondylosis with myelopathy 2018  . Chronic pain 2019   can't stand straight, uses walker for stability  . DDD (degenerative disc disease), lumbar   . Depression   . Displacement of cervical intervertebral disc 2018  . Dyspnea    HANDICAPPED  . Fibromyalgia    Polymyalgia Rheumatica  . GERD (gastroesophageal reflux disease)   . Heart murmur    not treated or being followed  . Hypertension    CONTROLLED  . Hypothyroidism   . Lumbar post-laminectomy syndrome 2018  . Lumbosacral radiculitis 2018  . Neuropathy due to medical condition (HCC)    feet, toes, fingers  . Osteoarthritis   . Osteoporosis   . Other long term (current) drug therapy 2019   from pain management  . Pain in the coccyx 2018   disorder of sacrum  . Spondylolisthesis 2018  . Spondylosis of lumbosacral region without myelopathy or radiculopathy 2018  . Spondylosis with myelopathy, lumbar region 2018  . Vitamin D deficiency     Past Surgical History:  Procedure Laterality Date  . ABDOMINAL HYSTERECTOMY  2000  . APPENDECTOMY    . arm surgery  2002  . BREAST BIOPSY Left 2005  benign  . CATARACT EXTRACTION W/PHACO Right 10/21/2017   Procedure: CATARACT EXTRACTION PHACO AND INTRAOCULAR LENS PLACEMENT ([place]) RIGHT;  Surgeon: Leandrew Koyanagi, MD;  Location: [facility];  Service: Ophthalmology;  Laterality: Right;  neck issues  . CESAREAN SECTION     x 2  . CHOLECYSTECTOMY    . COLONOSCOPY WITH PROPOFOL N/A 08/25/2017   Procedure: COLONOSCOPY WITH PROPOFOL;  Surgeon: Toledo, Benay Pike, MD;  Location: [facility];  Service: Gastroenterology;  Laterality: N/A;  . epidural injection  2018   steroids  . GASTRIC BYPASS  2004  . JOINT REPLACEMENT Bilateral  2005   knees  . LUMBAR FUSION  2016   x 2/ L3-5  . SHOULDER ARTHROSCOPY WITH ROTATOR CUFF REPAIR Right 2008   x 3  . SPINAL CORD STIMULATOR IMPLANT  2018   [facility] scientific  . TEMPORAL ARTERY BIOPSY / LIGATION    . TONSILLECTOMY      Family Psychiatric History: I have reviewed family psychiatric history from my progress note on 08/06/2017  Family History:  Family History  Problem Relation Age of Onset  . Kidney disease Mother   . Cancer Mother   . Anxiety disorder Mother   . Depression Mother   . Breast cancer Mother   . Cancer Father   . Hypertension Father   . Cancer Brother   . Breast cancer Maternal Aunt   . Breast cancer Cousin     Social History: Reviewed social history from my progress note on 08/06/2017 Social History   Socioeconomic History  . Marital status: Married    Spouse name: brady   . Number of children: 2  . Years of education: Not on file  . Highest education level: High school graduate  Occupational History    Comment: disabled   Social Needs  . Financial resource strain: Somewhat hard  . Food insecurity:    Worry: Never true    Inability: Never true  . Transportation needs:    Medical: No    Non-medical: No  Tobacco Use  . Smoking status: Former Smoker    Packs/day: 0.50    Years: 20.00    Pack years: 10.00    Types: Cigarettes    Last attempt to quit: 05/21/2007    Years since quitting: 10.8  . Smokeless tobacco: Never Used  Substance and Sexual Activity  . Alcohol use: No    Frequency: Never  . Drug use: No    Comment: managed by pain clinic  . Sexual activity: Not Currently  Lifestyle  . Physical activity:    Days per week: 0 days    Minutes per session: 0 min  . Stress: Not on file  Relationships  . Social connections:    Talks on phone: Three times a week    Gets together: Once a week    Attends religious service: More than 4 times per year    Active member of club or organization: No    Attends meetings of clubs or  organizations: Never    Relationship status: Married  Other Topics Concern  . Not on file  Social History Narrative  . Not on file    Allergies:  Allergies  Allergen Reactions  . Tape Rash    Skin peels off and blisters.  Probably was clear plastic tape.    Metabolic Disorder Labs: No results found for: HGBA1C, MPG No results found for: PROLACTIN No results found for: CHOL, TRIG, HDL, CHOLHDL, VLDL, LDLCALC No results found for:  TSH  Therapeutic Level Labs: No results found for: LITHIUM No results found for: VALPROATE No components found for:  CBMZ  Current Medications: Current Outpatient Medications  Medication Sig Dispense Refill  . alendronate (FOSAMAX) 70 MG tablet Take 70 mg by mouth once a week. Every Sunday    . buPROPion (WELLBUTRIN XL) 300 MG 24 hr tablet Take 1 tablet (300 mg total) by mouth daily. 90 tablet 1  . celecoxib (CELEBREX) 200 MG capsule Take 200 mg by mouth daily. AM    . furosemide (LASIX) 20 MG tablet Take 20 mg by mouth as needed.    . gabapentin (NEURONTIN) 300 MG capsule Take 1 capsule (300 mg total) by mouth 2 (two) times daily. 60 capsule 5  . glycopyrrolate (ROBINUL) 1 MG tablet Take 1 mg by mouth 2 (two) times daily. AM AND HS  3  . hydrOXYzine (ATARAX/VISTARIL) 10 MG tablet Take 1-2 tablets (10-20 mg total) by mouth at bedtime as needed for anxiety. 180 tablet 1  . ibuprofen (ADVIL,MOTRIN) 200 MG tablet Take 400 mg by mouth daily as needed for headache or moderate pain.    Marland Kitchen levorphanol (LEVODROMORAN) 2 MG tablet 3 mg in am, 2 mg in afternoon, and 2mg  evening For chronic pain To last 30 days from fill date Fill on or after: 02/03/2018, 03/05/2018, 04/03/2018 105 tablet 0  . levothyroxine (SYNTHROID, LEVOTHROID) 25 MCG tablet Take 25 mcg by mouth once daily before breakfast  0  . lipase/protease/amylase (CREON) 36000 UNITS CPEP capsule Take 36,000 Units by mouth 3 (three) times daily with meals.    Marland Kitchen loratadine (CLARITIN) 10 MG tablet Take 10 mg  by mouth daily as needed for allergies.    . metoprolol succinate (TOPROL-XL) 100 MG 24 hr tablet Take 100 mg by mouth once daily in the evening  0  . ondansetron (ZOFRAN-ODT) 8 MG disintegrating tablet Take 8 mg by mouth daily as needed for nausea or vomiting.    . pantoprazole (PROTONIX) 40 MG tablet Take 40 mg by mouth once daily in the evening    . predniSONE (DELTASONE) 5 MG tablet Take 4 mg by mouth daily.    . traZODone (DESYREL) 100 MG tablet TAKE 1 TO 2 TABLETS(100 TO 200 MG) BY MOUTH AT BEDTIME AS NEEDED FOR SLEEP 180 tablet 0  . vitamin B-12 (CYANOCOBALAMIN) 1000 MCG tablet Take 1,000 mcg by mouth daily.    . Vitamin D, Ergocalciferol, (DRISDOL) 50000 units CAPS capsule Take one capsule once a week for 12 weeks    . DULoxetine (CYMBALTA) 30 MG capsule Take 1 capsule (30 mg total) by mouth daily. 90 capsule 1   No current facility-administered medications for this visit.      Musculoskeletal: Strength & Muscle Tone: within normal limits Gait & Station: walks with walker Patient leans: N/A  Psychiatric Specialty Exam: Review of Systems  Psychiatric/Behavioral: Positive for depression. The patient is nervous/anxious.   All other systems reviewed and are negative.   Blood pressure (!) 146/86, pulse 65, temperature 98.8 F (37.1 C), temperature source Oral, weight 190 lb (86.2 kg).Body mass index is 37.11 kg/m.  General Appearance: Casual  Eye Contact:  Fair  Speech:  Clear and Coherent  Volume:  Normal  Mood:  Dysphoric  Affect:  Congruent  Thought Process:  Goal Directed and Descriptions of Associations: Intact  Orientation:  Full (Time, Place, and Person)  Thought Content: Logical   Suicidal Thoughts:  No  Homicidal Thoughts:  No  Memory:  Immediate;  Fair Recent;   Fair Remote;   Fair  Judgement:  Fair  Insight:  Fair  Psychomotor Activity:  Normal  Concentration:  Concentration: Fair and Attention Span: Fair  Recall:  [employer] of Knowledge: Fair  Language:  Fair  Akathisia:  No  Handed:  Right  AIMS (if indicated): denies tremors, rigidity  Assets:  Communication Skills Desire for Improvement Social Support  ADL's:  Intact  Cognition: WNL  Sleep:  Fair   Screenings: PHQ2-9     Clinical Support from 01/27/2018 in [facility] from 11/05/2017 in [facility] Procedure visit from 08/19/2017 in [facility] Clinical Support from 08/04/2017 in [facility] [place] from 06/04/2017 in [facility]  PHQ-2 Total Score  0  0  0  0  0       Assessment and Plan: Connie [name] is a 62 year old Caucasian female who has a history of depression, chronic pain, GERD, hypothyroidism, hypertension, fibromyalgia, presented to the clinic today for a follow-up visit.  Patient is currently struggling with depression and anxiety symptoms.  She is also going through grief from the loss of her mother recently.  Patient will continue to need medication readjustment.  Continue plan as noted below.  Plan MDD Continue Wellbutrin XL 300 mg p.o. Daily. Add Cymbalta 30 mg p.o. daily. Continue CBT with Ms. Peacock.  Anxiety disorder unspecified Cymbalta 30 mg p.o. daily. Continue CBT  For insomnia Trazodone 100-200 mg p.o. nightly as needed Hydroxyzine 10-20 mg p.o. nightly as needed  For bulimia Patient will continue psychotherapy sessions.  For memory problems Completed an MMSE-29 out of 30. She had TSH done-12/22/2017-within normal limits. She has a history of vitamin B12 deficiency-currently on replacement. Discussed with patient her memory issues can be closely monitored.  And if she needs a neurology referral she can be referred in the future.  Follow-up in clinic in 1 month or sooner if needed.  More than 50 % of the time was spent  for psychoeducation and supportive psychotherapy and care coordination.  This note was generated in part or whole with voice recognition software. Voice recognition is usually quite accurate but there are transcription errors that can and very often do occur. I apologize for any typographical errors that were not detected and corrected.      Ursula Alert, MD 03/16/2018, 4:55 PM

## 2018-03-16 NOTE — Patient Instructions (Signed)

## 2018-03-30 ENCOUNTER — Ambulatory Visit: Payer: 59 | Admitting: Licensed Clinical Social Worker

## 2018-04-13 ENCOUNTER — Ambulatory Visit: Payer: 59 | Admitting: Psychiatry

## 2018-04-13 NOTE — Progress Notes (Deleted)
[facility] MD  OP Progress Note  04/13/2018 1:00 PM Connie [name]  MRN:  [id number]  Chief Complaint:  HPI: Connie [name] is a 62 year old Caucasian female, married, on SSD, lives in [place], has a history of depression, history of delusional parasitosis, chronic pain, fibromyalgia, hypothyroidism, hypertension, polyarthralgia, presented to the clinic today for a follow-up visit.   Visit Diagnosis: No diagnosis found.  Past Psychiatric History: Reviewed past psychiatric history from my progress note on 08/06/2017.  Past trials of Latuda, Wellbutrin.   Past Medical History:  Past Medical History:  Diagnosis Date  . Anemia    in the past  . Anxiety   . Arthritis    all over  . Cervical spondylosis with myelopathy 2018  . Chronic pain 2019   can't stand straight, uses walker for stability  . DDD (degenerative disc disease), lumbar   . Depression   . Displacement of cervical intervertebral disc 2018  . Dyspnea    HANDICAPPED  . Fibromyalgia    Polymyalgia Rheumatica  . GERD (gastroesophageal reflux disease)   . Heart murmur    not treated or being followed  . Hypertension    CONTROLLED  . Hypothyroidism   . Lumbar post-laminectomy syndrome 2018  . Lumbosacral radiculitis 2018  . Neuropathy due to medical condition (HCC)    feet, toes, fingers  . Osteoarthritis   . Osteoporosis   . Other long term (current) drug therapy 2019   from pain management  . Pain in the coccyx 2018   disorder of sacrum  . Spondylolisthesis 2018  . Spondylosis of lumbosacral region without myelopathy or radiculopathy 2018  . Spondylosis with myelopathy, lumbar region 2018  . Vitamin D deficiency     Past Surgical History:  Procedure Laterality Date  . ABDOMINAL HYSTERECTOMY  2000  . APPENDECTOMY    . arm surgery  2002  . BREAST BIOPSY Left 2005   benign  . CATARACT EXTRACTION W/PHACO Right 10/21/2017   Procedure: CATARACT EXTRACTION PHACO AND INTRAOCULAR LENS PLACEMENT ([place]) RIGHT;  Surgeon: Leandrew Koyanagi, MD;  Location: [facility];  Service: Ophthalmology;  Laterality: Right;  neck issues  . CESAREAN SECTION     x 2  . CHOLECYSTECTOMY    . COLONOSCOPY WITH PROPOFOL N/A 08/25/2017   Procedure: COLONOSCOPY WITH PROPOFOL;  Surgeon: Toledo, Benay Pike, MD;  Location: [facility];  Service: Gastroenterology;  Laterality: N/A;  . epidural injection  2018   steroids  . GASTRIC BYPASS  2004  . JOINT REPLACEMENT Bilateral 2005   knees  . LUMBAR FUSION  2016   x 2/ L3-5  . SHOULDER ARTHROSCOPY WITH ROTATOR CUFF REPAIR Right 2008   x 3  . SPINAL CORD STIMULATOR IMPLANT  2018   [facility] scientific  . TEMPORAL ARTERY BIOPSY / LIGATION    . TONSILLECTOMY      Family Psychiatric History: I have reviewed family psychiatric history from my progress note on 08/06/2017  Family History:  Family History  Problem Relation Age of Onset  . Kidney disease Mother   . Cancer Mother   . Anxiety disorder Mother   . Depression Mother   . Breast cancer Mother   . Cancer Father   . Hypertension Father   . Cancer Brother   . Breast cancer Maternal Aunt   . Breast cancer Cousin     Social History: Reviewed social history from my progress note on 08/06/2017. Social History   Socioeconomic History  . Marital status: Married  Spouse name: brady   . Number of children: 2  . Years of education: Not on file  . Highest education level: High school graduate  Occupational History    Comment: disabled   Social Needs  . Financial resource strain: Somewhat hard  . Food insecurity:    Worry: Never true    Inability: Never true  . Transportation needs:    Medical: No    Non-medical: No  Tobacco Use  . Smoking status: Former Smoker    Packs/day: 0.50    Years: 20.00    Pack years: 10.00    Types: Cigarettes    Last attempt to quit: 05/21/2007    Years since quitting: 10.9  . Smokeless tobacco: Never Used  Substance and Sexual Activity  . Alcohol use: No    Frequency: Never  . Drug  use: No    Comment: managed by pain clinic  . Sexual activity: Not Currently  Lifestyle  . Physical activity:    Days per week: 0 days    Minutes per session: 0 min  . Stress: Not on file  Relationships  . Social connections:    Talks on phone: Three times a week    Gets together: Once a week    Attends religious service: More than 4 times per year    Active member of club or organization: No    Attends meetings of clubs or organizations: Never    Relationship status: Married  Other Topics Concern  . Not on file  Social History Narrative  . Not on file    Allergies:  Allergies  Allergen Reactions  . Tape Rash    Skin peels off and blisters.  Probably was clear plastic tape.    Metabolic Disorder Labs: No results found for: HGBA1C, MPG No results found for: PROLACTIN No results found for: CHOL, TRIG, HDL, CHOLHDL, VLDL, LDLCALC No results found for: TSH  Therapeutic Level Labs: No results found for: LITHIUM No results found for: VALPROATE No components found for:  CBMZ  Current Medications: Current Outpatient Medications  Medication Sig Dispense Refill  . alendronate (FOSAMAX) 70 MG tablet Take 70 mg by mouth once a week. Every Sunday    . buPROPion (WELLBUTRIN XL) 300 MG 24 hr tablet Take 1 tablet (300 mg total) by mouth daily. 90 tablet 1  . celecoxib (CELEBREX) 200 MG capsule Take 200 mg by mouth daily. AM    . DULoxetine (CYMBALTA) 30 MG capsule Take 1 capsule (30 mg total) by mouth daily. 90 capsule 1  . furosemide (LASIX) 20 MG tablet Take 20 mg by mouth as needed.    . gabapentin (NEURONTIN) 300 MG capsule Take 1 capsule (300 mg total) by mouth 2 (two) times daily. 60 capsule 5  . glycopyrrolate (ROBINUL) 1 MG tablet Take 1 mg by mouth 2 (two) times daily. AM AND HS  3  . hydrOXYzine (ATARAX/VISTARIL) 10 MG tablet Take 1-2 tablets (10-20 mg total) by mouth at bedtime as needed for anxiety. 180 tablet 1  . ibuprofen (ADVIL,MOTRIN) 200 MG tablet Take 400 mg by  mouth daily as needed for headache or moderate pain.    Marland Kitchen levorphanol (LEVODROMORAN) 2 MG tablet 3 mg in am, 2 mg in afternoon, and 2mg  evening For chronic pain To last 30 days from fill date Fill on or after: 02/03/2018, 03/05/2018, 04/03/2018 105 tablet 0  . levothyroxine (SYNTHROID, LEVOTHROID) 25 MCG tablet Take 25 mcg by mouth once daily before breakfast  0  .  lipase/protease/amylase (CREON) 36000 UNITS CPEP capsule Take 36,000 Units by mouth 3 (three) times daily with meals.    Marland Kitchen loratadine (CLARITIN) 10 MG tablet Take 10 mg by mouth daily as needed for allergies.    . metoprolol succinate (TOPROL-XL) 100 MG 24 hr tablet Take 100 mg by mouth once daily in the evening  0  . ondansetron (ZOFRAN-ODT) 8 MG disintegrating tablet Take 8 mg by mouth daily as needed for nausea or vomiting.    . pantoprazole (PROTONIX) 40 MG tablet Take 40 mg by mouth once daily in the evening    . predniSONE (DELTASONE) 5 MG tablet Take 4 mg by mouth daily.    . traZODone (DESYREL) 100 MG tablet TAKE 1 TO 2 TABLETS(100 TO 200 MG) BY MOUTH AT BEDTIME AS NEEDED FOR SLEEP 180 tablet 0  . vitamin B-12 (CYANOCOBALAMIN) 1000 MCG tablet Take 1,000 mcg by mouth daily.    . Vitamin D, Ergocalciferol, (DRISDOL) 50000 units CAPS capsule Take one capsule once a week for 12 weeks     No current facility-administered medications for this visit.      Musculoskeletal: Strength & Muscle Tone: {desc; muscle tone:32375} Gait & Station: {PE GAIT ED FUXN:23557} Patient leans: {Patient Leans:21022755}  Psychiatric Specialty Exam: ROS  There were no vitals taken for this visit.There is no height or weight on file to calculate BMI.  General Appearance: {Appearance:22683}  Eye Contact:  {BHH EYE CONTACT:22684}  Speech:  {Speech:22685}  Volume:  {Volume (PAA):22686}  Mood:  {BHH MOOD:22306}  Affect:  {Affect (PAA):22687}  Thought Process:  {Thought Process (PAA):22688}  Orientation:  {BHH ORIENTATION (PAA):22689}  Thought  Content: {Thought Content:22690}   Suicidal Thoughts:  {ST/HT (PAA):22692}  Homicidal Thoughts:  {ST/HT (PAA):22692}  Memory:  {BHH MEMORY:22881}  Judgement:  {Judgement (PAA):22694}  Insight:  {Insight (PAA):22695}  Psychomotor Activity:  {Psychomotor (PAA):22696}  Concentration:  {Concentration:21399}  Recall:  {BHH GOOD/FAIR/POOR:22877}  Fund of Knowledge: {BHH GOOD/FAIR/POOR:22877}  Language: {BHH GOOD/FAIR/POOR:22877}  Akathisia:  {BHH YES OR NO:22294}  Handed:  {Handed:22697}  AIMS (if indicated): {Desc; done/not:10129}  Assets:  {Assets (PAA):22698}  ADL's:  {BHH DUK'G:25427}  Cognition: {chl bhh cognition:304700322}  Sleep:  {BHH GOOD/FAIR/POOR:22877}   Screenings: PHQ2-9     Clinical Support from 01/27/2018 in [facility] from 11/05/2017 in [facility] [place] Procedure visit from 08/19/2017 in [facility] from 08/04/2017 in [facility] from 06/04/2017 in [facility]  PHQ-2 Total Score  0  0  0  0  0       Assessment and Plan: [name] is a 62 year old Caucasian female who has a history of depression, chronic pain, GERD, hypothyroidism, hypertension, fibromyalgia, presented to clinic today for a follow-up visit.  Ursula Alert, MD 04/13/2018, 1:00 PM

## 2018-04-15 ENCOUNTER — Other Ambulatory Visit: Payer: Self-pay

## 2018-04-15 ENCOUNTER — Encounter: Payer: Self-pay | Admitting: Student in an Organized Health Care Education/Training Program

## 2018-04-15 ENCOUNTER — Ambulatory Visit
Payer: Managed Care, Other (non HMO) | Attending: Student in an Organized Health Care Education/Training Program | Admitting: Student in an Organized Health Care Education/Training Program

## 2018-04-15 VITALS — BP 141/72 | HR 57 | Temp 98.1°F | Resp 18 | Ht 60.0 in | Wt 179.0 lb

## 2018-04-15 DIAGNOSIS — M47812 Spondylosis without myelopathy or radiculopathy, cervical region: Secondary | ICD-10-CM

## 2018-04-15 DIAGNOSIS — M47816 Spondylosis without myelopathy or radiculopathy, lumbar region: Secondary | ICD-10-CM

## 2018-04-15 DIAGNOSIS — M75121 Complete rotator cuff tear or rupture of right shoulder, not specified as traumatic: Secondary | ICD-10-CM | POA: Insufficient documentation

## 2018-04-15 DIAGNOSIS — Z87891 Personal history of nicotine dependence: Secondary | ICD-10-CM | POA: Insufficient documentation

## 2018-04-15 DIAGNOSIS — G894 Chronic pain syndrome: Secondary | ICD-10-CM | POA: Insufficient documentation

## 2018-04-15 DIAGNOSIS — M797 Fibromyalgia: Secondary | ICD-10-CM | POA: Diagnosis not present

## 2018-04-15 DIAGNOSIS — Z888 Allergy status to other drugs, medicaments and biological substances status: Secondary | ICD-10-CM | POA: Insufficient documentation

## 2018-04-15 DIAGNOSIS — Z79899 Other long term (current) drug therapy: Secondary | ICD-10-CM | POA: Insufficient documentation

## 2018-04-15 DIAGNOSIS — Z79890 Hormone replacement therapy (postmenopausal): Secondary | ICD-10-CM | POA: Insufficient documentation

## 2018-04-15 DIAGNOSIS — E559 Vitamin D deficiency, unspecified: Secondary | ICD-10-CM | POA: Diagnosis not present

## 2018-04-15 DIAGNOSIS — M4726 Other spondylosis with radiculopathy, lumbar region: Secondary | ICD-10-CM | POA: Insufficient documentation

## 2018-04-15 DIAGNOSIS — G935 Compression of brain: Secondary | ICD-10-CM | POA: Diagnosis not present

## 2018-04-15 DIAGNOSIS — F329 Major depressive disorder, single episode, unspecified: Secondary | ICD-10-CM | POA: Insufficient documentation

## 2018-04-15 DIAGNOSIS — M48061 Spinal stenosis, lumbar region without neurogenic claudication: Secondary | ICD-10-CM | POA: Diagnosis not present

## 2018-04-15 DIAGNOSIS — M961 Postlaminectomy syndrome, not elsewhere classified: Secondary | ICD-10-CM | POA: Insufficient documentation

## 2018-04-15 DIAGNOSIS — Z7952 Long term (current) use of systemic steroids: Secondary | ICD-10-CM | POA: Diagnosis not present

## 2018-04-15 DIAGNOSIS — M4722 Other spondylosis with radiculopathy, cervical region: Secondary | ICD-10-CM | POA: Diagnosis not present

## 2018-04-15 DIAGNOSIS — Z6834 Body mass index (BMI) 34.0-34.9, adult: Secondary | ICD-10-CM | POA: Diagnosis not present

## 2018-04-15 DIAGNOSIS — M159 Polyosteoarthritis, unspecified: Secondary | ICD-10-CM | POA: Diagnosis not present

## 2018-04-15 DIAGNOSIS — G4700 Insomnia, unspecified: Secondary | ICD-10-CM | POA: Insufficient documentation

## 2018-04-15 DIAGNOSIS — M47817 Spondylosis without myelopathy or radiculopathy, lumbosacral region: Secondary | ICD-10-CM | POA: Insufficient documentation

## 2018-04-15 DIAGNOSIS — I10 Essential (primary) hypertension: Secondary | ICD-10-CM | POA: Insufficient documentation

## 2018-04-15 DIAGNOSIS — Z9889 Other specified postprocedural states: Secondary | ICD-10-CM | POA: Insufficient documentation

## 2018-04-15 DIAGNOSIS — M5116 Intervertebral disc disorders with radiculopathy, lumbar region: Secondary | ICD-10-CM | POA: Insufficient documentation

## 2018-04-15 DIAGNOSIS — M5010 Cervical disc disorder with radiculopathy, unspecified cervical region: Secondary | ICD-10-CM | POA: Insufficient documentation

## 2018-04-15 DIAGNOSIS — Z9049 Acquired absence of other specified parts of digestive tract: Secondary | ICD-10-CM | POA: Insufficient documentation

## 2018-04-15 DIAGNOSIS — E039 Hypothyroidism, unspecified: Secondary | ICD-10-CM | POA: Diagnosis not present

## 2018-04-15 DIAGNOSIS — Z981 Arthrodesis status: Secondary | ICD-10-CM | POA: Insufficient documentation

## 2018-04-15 DIAGNOSIS — M5136 Other intervertebral disc degeneration, lumbar region: Secondary | ICD-10-CM | POA: Diagnosis not present

## 2018-04-15 DIAGNOSIS — M810 Age-related osteoporosis without current pathological fracture: Secondary | ICD-10-CM | POA: Insufficient documentation

## 2018-04-15 DIAGNOSIS — Z8249 Family history of ischemic heart disease and other diseases of the circulatory system: Secondary | ICD-10-CM | POA: Insufficient documentation

## 2018-04-15 DIAGNOSIS — Z803 Family history of malignant neoplasm of breast: Secondary | ICD-10-CM | POA: Insufficient documentation

## 2018-04-15 DIAGNOSIS — Z9884 Bariatric surgery status: Secondary | ICD-10-CM | POA: Insufficient documentation

## 2018-04-15 DIAGNOSIS — Z818 Family history of other mental and behavioral disorders: Secondary | ICD-10-CM | POA: Insufficient documentation

## 2018-04-15 MED ORDER — LEVORPHANOL TARTRATE 2 MG PO TABS: ORAL_TABLET | ORAL | 0 refills | Status: DC

## 2018-04-15 NOTE — Progress Notes (Signed)
Nursing Pain Medication Assessment:  Safety precautions to be maintained throughout the outpatient stay will include: orient to surroundings, keep bed in low position, maintain call bell within reach at all times, provide assistance with transfer out of bed and ambulation.  Medication Inspection Compliance: Pill count conducted under aseptic conditions, in front of the patient. Neither the pills nor the bottle was removed from the patient's sight at any time. Once count was completed pills were immediately returned to the patient in their original bottle.  Medication: Levorphenol Pill/Patch Count: 69 of 105 pills remain Pill/Patch Appearance: Markings consistent with prescribed medication Bottle Appearance: Standard pharmacy container. Clearly labeled. Filled Date: 89 / 30 / 2019 Last Medication intake:  Today

## 2018-04-15 NOTE — Patient Instructions (Signed)
You have been given 3 Rx for Levorphenol to last until 07/30/2018.

## 2018-04-15 NOTE — Progress Notes (Signed)
Patient's Name: Connie [name]  MRN: [id number]  Referring Provider: Tracie Harrier, MD  DOB: 1955-11-26  PCP: Tracie Harrier, MD  DOS: 04/15/2018  Note by: [name], MD  Service setting: Ambulatory outpatient  Specialty: Interventional Pain Management  Location: [facility] (AMB) Pain Management Facility    Patient type: Established   Primary Reason(s) for Visit: Encounter for prescription drug management. (Level of risk: moderate)  CC: Back Pain (lower)  HPI  Ms. Glazebrook is a 62 y.o. year old, female patient, who comes today for a medication management evaluation. She has Degenerative joint disease of cervical and lumbar spine; Lumbar spondylosis; History of lumbar fusion; Lumbar degenerative disc disease; Fibromyalgia; Cervicalgia; DDD (degenerative disc disease), cervical; Chronic pain syndrome; Major depressive disorder; Spinal cord stimulator status; Chiari malformation type I ([place]); Adiposis dolorosa; Acquired spondylolisthesis; Chronic constipation; Chronic midline low back pain; Chronic pain; Daytime somnolence; Delusions of parasitosis ([place]); Diastolic dysfunction; Dry eyes, bilateral; Dysphagia, oropharyngeal phase; Edema; GERD without esophagitis; Generalized osteoarthritis of multiple sites; Fluid collection at surgical site; Fatty liver; Fatigue; Essential hypertension; Internal hemorrhoid; Insomnia; Hypothyroidism; H/O gastric bypass; Seasonal allergies; Rotator cuff arthropathy of both shoulders; Osteoporosis; Obesity, morbid ([place]); Nonunion of fracture; Morgellons disease; Medication-induced delirium, acute, hyperactive ([place]); Lumbar pseudoarthrosis; Leg cramps; Iron deficiency; Joint pain; Spinal stenosis of lumbar region without neurogenic claudication; Arthritis; Complete tear of right rotator cuff; OA (osteoarthritis); Rotator cuff tendinitis, right; Lumbar post-laminectomy syndrome; Lumbar radiculopathy; Cervical radiculopathy; Bilateral hand pain; Candidiasis of skin and nails;  CRP elevated; Jaw pain, non-TMJ; Lumbosacral radiculitis; Lumbosacral spondylosis without myelopathy; Pain in the coccyx; Menopausal symptom; Temporal headache; Stiffness of shoulder joint; SOBOE (shortness of breath on exertion); Screening for osteoporosis; Vitamin D deficiency, unspecified; and Osteoporosis, post-menopausal on their problem list. Her primarily concern today is the Back Pain (lower)  Pain Assessment: Location: Lower Back Radiating: moves up to neck Onset: More than a month ago Duration: Chronic pain Quality: Constant Severity: 2 /10 (subjective, self-reported pain score)  Note: Reported level is compatible with observation.                         When using our objective Pain Scale, levels between 6 and 10/10 are said to belong in an emergency room, as it progressively worsens from a 6/10, described as severely limiting, requiring emergency care not usually available at an outpatient pain management facility. At a 6/10 level, communication becomes difficult and requires great effort. Assistance to reach the emergency department may be required. Facial flushing and profuse sweating along with potentially dangerous increases in heart rate and blood pressure will be evident. Effect on ADL: limits daily activities Timing: Constant Modifying factors: meds BP: (!) 141/72  HR: (!) 57  Ms. Depaul was last scheduled for an appointment on 03/02/2018 for medication management. During today's appointment we reviewed Ms. Messina's chronic pain status, as well as her outpatient medication regimen.  The patient  reports no history of drug use. Her body mass index is 34.96 kg/m.  Further details on both, my assessment(s), as well as the proposed treatment plan, please see below.  Controlled Substance Pharmacotherapy Assessment REMS (Risk Evaluation and Mitigation Strategy)  Analgesic: Levorphanol 3 mg every morning, 2 mg every afternoon, 2 mg nightly MME/day: 77 mg/day.  Rise Patience,  RN  04/15/2018 12:38 PM  Signed Nursing Pain Medication Assessment:  Safety precautions to be maintained throughout the outpatient stay will include: orient to surroundings, keep bed in low position, maintain call bell  within reach at all times, provide assistance with transfer out of bed and ambulation.  Medication Inspection Compliance: Pill count conducted under aseptic conditions, in front of the patient. Neither the pills nor the bottle was removed from the patient's sight at any time. Once count was completed pills were immediately returned to the patient in their original bottle.  Medication: Levorphenol Pill/Patch Count: 69 of 105 pills remain Pill/Patch Appearance: Markings consistent with prescribed medication Bottle Appearance: Standard pharmacy container. Clearly labeled. Filled Date: 2 / 30 / 2019 Last Medication intake:  Today   Pharmacokinetics: Liberation and absorption (onset of action): WNL Distribution (time to peak effect): WNL Metabolism and excretion (duration of action): WNL         Pharmacodynamics: Desired effects: Analgesia: Ms. Klatt reports >50% benefit. Functional ability: Patient reports that medication allows her to accomplish basic ADLs Clinically meaningful improvement in function (CMIF): Sustained CMIF goals met Perceived effectiveness: Described as relatively effective, allowing for increase in activities of daily living (ADL) Undesirable effects: Side-effects or Adverse reactions: None reported Monitoring: [place] PMP: Online review of the past 9-monthperiod conducted. Compliant with practice rules and regulations Last UDS on record: Summary  Date Value Ref Range Status  06/04/2017 FINAL  Final    Comment:    ==================================================================== TOXASSURE SELECT 13 (MW) ==================================================================== Specimen Alert Note:  Urinary creatinine is low; ability to detect some drugs  may be compromised.  Interpret results with caution. ==================================================================== Test                             Result       Flag       Units   NO DRUGS DETECTED. ==================================================================== Test                      Result    Flag   Units      Ref Range   Creatinine              16        L      mg/dL      >=20 ==================================================================== Declared Medications:  The flagging and interpretation on this report are based on the  following declared medications.  Unexpected results may arise from  inaccuracies in the declared medications.  **Note: The testing scope of this panel does not include following  reported medications:  Bupropion  Celecoxib  Diphenhydramine (Benadryl)  Fluticasone (Flonase)  Furosemide (Lasix)  Gabapentin  Glycopyrrolate  Ibuprofen  Levorphanol  Loratadine  Metoprolol  Ondansetron  Pantoprazole ==================================================================== For clinical consultation, please call ([phone] ====================================================================    UDS interpretation: Compliant          Medication Assessment Form: Reviewed. Patient indicates being compliant with therapy Treatment compliance: Compliant Risk Assessment Profile: Aberrant behavior: See prior evaluations. None observed or detected today Comorbid factors increasing risk of overdose: See prior notes. No additional risks detected today Opioid risk tool (ORT) (Total Score): 1 Personal History of Substance Abuse (SUD-Substance use disorder):  Alcohol: Negative  Illegal Drugs: Negative  Rx Drugs: Negative  ORT Risk Level calculation: Low Risk Risk of substance use disorder (SUD): Low Opioid Risk Tool - 04/15/18 1235      Family History of Substance Abuse   Alcohol  Positive Female    Illegal Drugs  Negative    Rx Drugs  Negative       Personal History of Substance Abuse  Alcohol  Negative    Illegal Drugs  Negative    Rx Drugs  Negative      Age   Age between 26-45 years   No      History of Preadolescent Sexual Abuse   History of Preadolescent Sexual Abuse  Negative or Female      Psychological Disease   Psychological Disease  Negative    Depression  Negative      Total Score   Opioid Risk Tool Scoring  1    Opioid Risk Interpretation  Low Risk      ORT Scoring interpretation table:  Score <3 = Low Risk for SUD  Score between 4-7 = Moderate Risk for SUD  Score >8 = High Risk for Opioid Abuse   Risk Mitigation Strategies:  Patient Counseling: Covered Patient-Prescriber Agreement (PPA): Present and active  Notification to other healthcare providers: Done  Pharmacologic Plan: No change in therapy, at this time.             Laboratory Chemistry  Inflammation Markers (CRP: Acute Phase) (ESR: Chronic Phase) No results found for: CRP, ESRSEDRATE, LATICACIDVEN                       Rheumatology Markers No results found for: RF, ANA, LABURIC, URICUR, LYMEIGGIGMAB, LYMEABIGMQN, HLAB27                      Renal Function Markers Lab Results  Component Value Date   BUN 18 06/23/2017   CREATININE 0.60 06/23/2017   GFRAA >60 06/23/2017   GFRNONAA >60 06/23/2017                             Hepatic Function Markers Lab Results  Component Value Date   AST 20 06/23/2017   ALT 9 (L) 06/23/2017   ALBUMIN 3.6 06/23/2017   ALKPHOS 88 06/23/2017                        Electrolytes Lab Results  Component Value Date   NA 135 06/23/2017   K 4.9 06/23/2017   CL 102 06/23/2017   CALCIUM 8.5 (L) 06/23/2017                          Coagulation Parameters Lab Results  Component Value Date   PLT 211 06/23/2017                        Cardiovascular Markers Lab Results  Component Value Date   HGB 11.8 (L) 06/23/2017   HCT 36.1 06/23/2017                         CA Markers No results found  for: CEA, CA125, LABCA2                      Note: Lab results reviewed.  Recent Diagnostic Imaging Results  DG C-Arm 1-60 Min-No Report Fluoroscopy was utilized by the requesting physician.  No radiographic  interpretation.   Complexity Note: Imaging results reviewed. Results shared with Ms. Tapanes, using Layman's terms.                         Meds   Current Outpatient Medications:  .  buPROPion (WELLBUTRIN XL)  300 MG 24 hr tablet, Take 1 tablet (300 mg total) by mouth daily., Disp: 90 tablet, Rfl: 1 .  celecoxib (CELEBREX) 200 MG capsule, Take 200 mg by mouth daily. AM, Disp: , Rfl:  .  DULoxetine (CYMBALTA) 30 MG capsule, Take 1 capsule (30 mg total) by mouth daily., Disp: 90 capsule, Rfl: 1 .  furosemide (LASIX) 20 MG tablet, Take 20 mg by mouth as needed., Disp: , Rfl:  .  gabapentin (NEURONTIN) 300 MG capsule, Take 1 capsule (300 mg total) by mouth 2 (two) times daily., Disp: 60 capsule, Rfl: 5 .  glycopyrrolate (ROBINUL) 1 MG tablet, Take 1 mg by mouth 2 (two) times daily. AM AND HS, Disp: , Rfl: 3 .  hydrOXYzine (ATARAX/VISTARIL) 10 MG tablet, Take 1-2 tablets (10-20 mg total) by mouth at bedtime as needed for anxiety., Disp: 180 tablet, Rfl: 1 .  ibuprofen (ADVIL,MOTRIN) 200 MG tablet, Take 400 mg by mouth daily as needed for headache or moderate pain., Disp: , Rfl:  .  levorphanol (LEVODROMORAN) 2 MG tablet, 3 mg in am, 2 mg in afternoon, and 38m evening For chronic pain To last 30 days from fill date Fill on or after: 05/03/2018, 06/02/2018, 06/30/2018, Disp: 105 tablet, Rfl: 0 .  levothyroxine (SYNTHROID, LEVOTHROID) 25 MCG tablet, Take 25 mcg by mouth once daily before breakfast, Disp: , Rfl: 0 .  loratadine (CLARITIN) 10 MG tablet, Take 10 mg by mouth daily as needed for allergies., Disp: , Rfl:  .  metoprolol succinate (TOPROL-XL) 100 MG 24 hr tablet, Take 100 mg by mouth once daily in the evening, Disp: , Rfl: 0 .  ondansetron (ZOFRAN-ODT) 8 MG disintegrating tablet, Take 8 mg  by mouth daily as needed for nausea or vomiting., Disp: , Rfl:  .  pantoprazole (PROTONIX) 40 MG tablet, Take 40 mg by mouth once daily in the evening, Disp: , Rfl:  .  predniSONE (DELTASONE) 5 MG tablet, Take 4 mg by mouth daily., Disp: , Rfl:  .  traZODone (DESYREL) 100 MG tablet, TAKE 1 TO 2 TABLETS(100 TO 200 MG) BY MOUTH AT BEDTIME AS NEEDED FOR SLEEP, Disp: 180 tablet, Rfl: 0 .  alendronate (FOSAMAX) 70 MG tablet, Take 70 mg by mouth once a week. Every Sunday, Disp: , Rfl:  .  lipase/protease/amylase (CREON) 36000 UNITS CPEP capsule, Take 36,000 Units by mouth 3 (three) times daily with meals., Disp: , Rfl:  .  vitamin B-12 (CYANOCOBALAMIN) 1000 MCG tablet, Take 1,000 mcg by mouth daily., Disp: , Rfl:  .  Vitamin D, Ergocalciferol, (DRISDOL) 50000 units CAPS capsule, Take one capsule once a week for 12 weeks, Disp: , Rfl:   ROS  Constitutional: Denies any fever or chills Gastrointestinal: No reported hemesis, hematochezia, vomiting, or acute GI distress Musculoskeletal: Denies any acute onset joint swelling, redness, loss of ROM, or weakness Neurological: No reported episodes of acute onset apraxia, aphasia, dysarthria, agnosia, amnesia, paralysis, loss of coordination, or loss of consciousness  Allergies  Ms. Mcphee is allergic to tape.  PFSH  Drug: Ms. LMuratore reports no history of drug use. Alcohol:  reports no history of alcohol use. Tobacco:  reports that she quit smoking about 10 years ago. Her smoking use included cigarettes. She has a 10.00 pack-year smoking history. She has never used smokeless tobacco. Medical:  has a past medical history of Anemia, Anxiety, Arthritis, Cervical spondylosis with myelopathy (2018), Chronic pain (2019), DDD (degenerative disc disease), lumbar, Depression, Displacement of cervical intervertebral disc (2018), Dyspnea, Fibromyalgia, GERD (  gastroesophageal reflux disease), Heart murmur, Hypertension, Hypothyroidism, Lumbar post-laminectomy syndrome  (2018), Lumbosacral radiculitis (2018), Neuropathy due to medical condition ([place]), Osteoarthritis, Osteoporosis, Other long term (current) drug therapy (2019), Pain in the coccyx (2018), Spondylolisthesis (2018), Spondylosis of lumbosacral region without myelopathy or radiculopathy (2018), Spondylosis with myelopathy, lumbar region (2018), and Vitamin D deficiency. Surgical: Ms. Stankovic  has a past surgical history that includes Cesarean section; Gastric bypass (2004); Lumbar fusion (2016); Spinal cord stimulator implant (2018); Cholecystectomy; Appendectomy; Tonsillectomy; Shoulder arthroscopy with rotator cuff repair (Right, 2008); Joint replacement (Bilateral, 2005); Abdominal hysterectomy (2000); epidural injection (2018); arm surgery (2002); Breast biopsy (Left, 2005); Colonoscopy with propofol (N/A, 08/25/2017); Temporal artery biopsy / ligation; and Cataract extraction w/PHACO (Right, 10/21/2017). Family: family history includes Anxiety disorder in her mother; Breast cancer in her cousin, maternal aunt, and mother; Cancer in her brother, father, and mother; Depression in her mother; Hypertension in her father; Kidney disease in her mother.  Constitutional Exam  General appearance: Well nourished, well developed, and well hydrated. In no apparent acute distress Vitals:   04/15/18 1239  BP: (!) 141/72  Pulse: (!) 57  Resp: 18  Temp: 98.1 F (36.7 C)  TempSrc: Oral  SpO2: 97%  Weight: 179 lb (81.2 kg)  Height: 5' (1.524 m)   BMI Assessment: Estimated body mass index is 34.96 kg/m as calculated from the following:   Height as of this encounter: 5' (1.524 m).   Weight as of this encounter: 179 lb (81.2 kg).  BMI interpretation table: BMI level Category Range association with higher incidence of chronic pain  <18 kg/m2 Underweight   18.5-24.9 kg/m2 Ideal body weight   25-29.9 kg/m2 Overweight Increased incidence by 20%  30-34.9 kg/m2 Obese (Class I) Increased incidence by 68%  35-39.9  kg/m2 Severe obesity (Class II) Increased incidence by 136%  >40 kg/m2 Extreme obesity (Class III) Increased incidence by 254%   Patient's current BMI Ideal Body weight  Body mass index is 34.96 kg/m. Ideal body weight: 45.5 kg (100 lb 4.9 oz) Adjusted ideal body weight: 59.8 kg (131 lb 12.6 oz)   BMI Readings from Last 4 Encounters:  04/15/18 34.96 kg/m  01/27/18 35.54 kg/m  11/05/17 38.47 kg/m  10/21/17 38.67 kg/m   Wt Readings from Last 4 Encounters:  04/15/18 179 lb (81.2 kg)  01/27/18 182 lb (82.6 kg)  11/05/17 197 lb (89.4 kg)  10/21/17 198 lb (89.8 kg)  Psych/Mental status: Alert, oriented x 3 (person, place, & time)       Eyes: PERLA Respiratory: No evidence of acute respiratory distress  Cervical Spine Area Exam  Skin & Axial Inspection: No masses, redness, edema, swelling, or associated skin lesions Alignment: Symmetrical Functional ROM: Unrestricted ROM      Stability: No instability detected Muscle Tone/Strength: Functionally intact. No obvious neuro-muscular anomalies detected. Sensory (Neurological): Unimpaired Palpation: No palpable anomalies              Upper Extremity (UE) Exam    Side: Right upper extremity  Side: Left upper extremity  Skin & Extremity Inspection: Skin color, temperature, and hair growth are WNL. No peripheral edema or cyanosis. No masses, redness, swelling, asymmetry, or associated skin lesions. No contractures.  Skin & Extremity Inspection: Skin color, temperature, and hair growth are WNL. No peripheral edema or cyanosis. No masses, redness, swelling, asymmetry, or associated skin lesions. No contractures.  Functional ROM: Unrestricted ROM          Functional ROM: Unrestricted ROM  Muscle Tone/Strength: Functionally intact. No obvious neuro-muscular anomalies detected.  Muscle Tone/Strength: Functionally intact. No obvious neuro-muscular anomalies detected.  Sensory (Neurological): Unimpaired          Sensory (Neurological):  Unimpaired          Palpation: No palpable anomalies              Palpation: No palpable anomalies              Provocative Test(s):  Phalen's test: deferred Tinel's test: deferred Apley's scratch test (touch opposite shoulder):  Action 1 (Across chest): deferred Action 2 (Overhead): deferred Action 3 (LB reach): deferred   Provocative Test(s):  Phalen's test: deferred Tinel's test: deferred Apley's scratch test (touch opposite shoulder):  Action 1 (Across chest): deferred Action 2 (Overhead): deferred Action 3 (LB reach): deferred    Thoracic Spine Area Exam  Skin & Axial Inspection: No masses, redness, or swelling Alignment: Symmetrical Functional ROM: Unrestricted ROM Stability: No instability detected Muscle Tone/Strength: Functionally intact. No obvious neuro-muscular anomalies detected. Sensory (Neurological): Unimpaired Muscle strength & Tone: No palpable anomalies  Lumbar Spine Area Exam  Skin & Axial Inspection: Well healed scar from previous spine surgery detected Alignment: Symmetrical Functional ROM: Decreased ROM       Stability: No instability detected Muscle Tone/Strength: Functionally intact. No obvious neuro-muscular anomalies detected. Sensory (Neurological): Dermatomal pain pattern and musculoskeletal Palpation: Complains of area being tender to palpation       Provocative Tests: Hyperextension/rotation test: deferred today       Lumbar quadrant test (Kemp's test): deferred today       Lateral bending test: deferred today       Patrick's Maneuver: deferred today                   FABER* test: deferred today                   S-I anterior distraction/compression test: deferred today         S-I lateral compression test: deferred today         S-I Thigh-thrust test: deferred today         S-I Gaenslen's test: deferred today         *(Flexion, ABduction and External Rotation)  Gait & Posture Assessment  Ambulation: Patient ambulates using a  walker Gait: Limited. Using assistive device to ambulate Posture: Difficulty standing up straight, due to pain   Lower Extremity Exam    Side: Right lower extremity  Side: Left lower extremity  Stability: No instability observed          Stability: No instability observed          Skin & Extremity Inspection: Skin color, temperature, and hair growth are WNL. No peripheral edema or cyanosis. No masses, redness, swelling, asymmetry, or associated skin lesions. No contractures.  Skin & Extremity Inspection: Skin color, temperature, and hair growth are WNL. No peripheral edema or cyanosis. No masses, redness, swelling, asymmetry, or associated skin lesions. No contractures.  Functional ROM: Decreased ROM for all joints of the lower extremity          Functional ROM: Decreased ROM for all joints of the lower extremity          Muscle Tone/Strength: Functionally intact. No obvious neuro-muscular anomalies detected.  Muscle Tone/Strength: Functionally intact. No obvious neuro-muscular anomalies detected.  Sensory (Neurological): Dermatomal pain pattern        Sensory (Neurological): Dermatomal pain pattern  DTR: Patellar: deferred today Achilles: deferred today Plantar: deferred today  DTR: Patellar: deferred today Achilles: deferred today Plantar: deferred today  Palpation: No palpable anomalies  Palpation: No palpable anomalies   Assessment  Primary Diagnosis & Pertinent Problem List: The primary encounter diagnosis was Spondylosis of cervical region without myelopathy or radiculopathy. Diagnoses of History of lumbar fusion, Lumbar degenerative disc disease, Lumbar spondylosis, Fibromyalgia, Major depressive disorder, remission status unspecified, unspecified whether recurrent, and Chronic pain syndrome were also pertinent to this visit.  Status Diagnosis  Controlled Controlled Controlled 1. Spondylosis of cervical region without myelopathy or radiculopathy   2. History of lumbar  fusion   3. Lumbar degenerative disc disease   4. Lumbar spondylosis   5. Fibromyalgia   6. Major depressive disorder, remission status unspecified, unspecified whether recurrent   7. Chronic pain syndrome      General Recommendations: The pain condition that the patient suffers from is best treated with a multidisciplinary approach that involves an increase in physical activity to prevent de-conditioning and worsening of the pain cycle, as well as psychological counseling (formal and/or informal) to address the co-morbid psychological affects of pain. Treatment will often involve judicious use of pain medications and interventional procedures to decrease the pain, allowing the patient to participate in the physical activity that will ultimately produce long-lasting pain reductions. The goal of the multidisciplinary approach is to return the patient to a higher level of overall function and to restore their ability to perform activities of daily living.  62 year old female with chronic pain syndrome secondary to lumbar degenerative disc disease, lumbar radiculopathy, lumbar spondylosis status post L3-L5 lumbar fusion who presents with neck pain that radiates into bilateral shoulders and bilateral upper extremities. She is here for medication refill. Upon chart review from neurosurgery note, patient's MRI from 2016 showed mild compression at C5-C6 and stenosis at C6/7. Patient's CT myelogram reveals left disc osteophyte complex with mild central canal stenosis at C5-6, facet hypertrophy and cervical spondylosis at C4/5/6/7, left greater than right.Patient is status post diagnostic bilateral C4, 5, 6, 7 cervical facet medial branch nerve blocks (4/25) which were effective for pain. We can consider repeating bilateral cervical facet medial branch nerve blocks in the future. Otherwise I will refill the patient's levorphanol as below ([place] PMP checked and appropriate, UDS to date). She is instructed to  continue her gabapentin as prescribed and does not need any refills.  Plan: -UDS -Refill levorphanol at previous dose below -Continue gabapentin as prescribed,no refills needed -Can consider repeat cervical facet medial branch nerve block #2, bilateral at C4, 5, 6, 7 for cervical spondylosis and facet arthropathy if neck pain worsens and if patient wants to repeat   Plan of Care  Pharmacotherapy (Medications Ordered): Meds ordered this encounter  Medications  . DISCONTD: levorphanol (LEVODROMORAN) 2 MG tablet    Sig: 3 mg in am, 2 mg in afternoon, and 67m evening For chronic pain To last 30 days from fill date Fill on or after: 05/03/2018, 06/02/2018, 06/30/2018    Dispense:  105 tablet    Refill:  0  . DISCONTD: levorphanol (LEVODROMORAN) 2 MG tablet    Sig: 3 mg in am, 2 mg in afternoon, and 240mevening For chronic pain To last 30 days from fill date Fill on or after: 05/03/2018, 06/02/2018, 06/30/2018    Dispense:  105 tablet    Refill:  0  . levorphanol (LEVODROMORAN) 2 MG tablet    Sig: 3 mg in am, 2 mg in  afternoon, and 34m evening For chronic pain To last 30 days from fill date Fill on or after: 05/03/2018, 06/02/2018, 06/30/2018    Dispense:  105 tablet    Refill:  0   Lab-work, procedure(s), and/or referral(s): Orders Placed This Encounter  Procedures  . ToxASSURE Select 13 (MW), Urine    Provider-requested follow-up: Return in about 3 months (around 07/15/2018).  Future Appointments  Date Time Provider DLoco 04/29/2018  1:00 PM [place]  07/13/2018  2:30 PM [facility]    Primary Care Physician: HTracie Harrier [facility] Note by: BGillis Santa M.D Date: 04/15/2018; Time: 2:58 PM  Patient Instructions  You have been given 3 Rx for Levorphenol to last until 07/30/2018.

## 2018-04-21 LAB — TOXASSURE SELECT 13 (MW), URINE

## 2018-04-29 ENCOUNTER — Ambulatory Visit (INDEPENDENT_AMBULATORY_CARE_PROVIDER_SITE_OTHER): Payer: 59 | Admitting: Licensed Clinical Social Worker

## 2018-04-29 ENCOUNTER — Encounter: Payer: Self-pay | Admitting: Psychiatry

## 2018-04-29 ENCOUNTER — Encounter: Payer: Managed Care, Other (non HMO) | Admitting: Student in an Organized Health Care Education/Training Program

## 2018-04-29 ENCOUNTER — Ambulatory Visit (INDEPENDENT_AMBULATORY_CARE_PROVIDER_SITE_OTHER): Payer: 59 | Admitting: Psychiatry

## 2018-04-29 VITALS — BP 161/83 | HR 55 | Temp 98.8°F | Wt 188.4 lb

## 2018-04-29 DIAGNOSIS — F331 Major depressive disorder, recurrent, moderate: Secondary | ICD-10-CM

## 2018-04-29 DIAGNOSIS — F5105 Insomnia due to other mental disorder: Secondary | ICD-10-CM | POA: Diagnosis not present

## 2018-04-29 MED ORDER — CITALOPRAM HYDROBROMIDE 10 MG PO TABS: 5.0000 mg | ORAL_TABLET | Freq: Every day | ORAL | 1 refills | Status: DC

## 2018-04-29 NOTE — Patient Instructions (Signed)
Citalopram tablets What is this medicine? CITALOPRAM (sye TAL oh pram) is a medicine for depression. This medicine may be used for other purposes; ask your health care provider or pharmacist if you have questions. COMMON BRAND NAME(S): Celexa What should I tell my health care provider before I take this medicine? They need to know if you have any of these conditions: -bleeding disorders -bipolar disorder or a family history of bipolar disorder -glaucoma -heart disease -history of irregular heartbeat -kidney disease -liver disease -low levels of magnesium or potassium in the blood -receiving electroconvulsive therapy -seizures -suicidal thoughts, plans, or attempt; a previous suicide attempt by you or a family member -take medicines that treat or prevent blood clots -thyroid disease -an unusual or allergic reaction to citalopram, escitalopram, other medicines, foods, dyes, or preservatives -pregnant or trying to become pregnant -breast-feeding How should I use this medicine? Take this medicine by mouth with a glass of water. Follow the directions on the prescription label. You can take it with or without food. Take your medicine at regular intervals. Do not take your medicine more often than directed. Do not stop taking this medicine suddenly except upon the advice of your doctor. Stopping this medicine too quickly may cause serious side effects or your condition may worsen. A special MedGuide will be given to you by the pharmacist with each prescription and refill. Be sure to read this information carefully each time. Talk to your pediatrician regarding the use of this medicine in children. Special care may be needed. Patients over 60 years old may have a stronger reaction and need a smaller dose. Overdosage: If you think you have taken too much of this medicine contact a poison control center or emergency room at once. NOTE: This medicine is only for you. Do not share this medicine with  others. What if I miss a dose? If you miss a dose, take it as soon as you can. If it is almost time for your next dose, take only that dose. Do not take double or extra doses. What may interact with this medicine? Do not take this medicine with any of the following medications: -certain medicines for fungal infections like fluconazole, itraconazole, ketoconazole, posaconazole, voriconazole -cisapride -dofetilide -dronedarone -escitalopram -linezolid -MAOIs like Carbex, Eldepryl, Marplan, Nardil, and Parnate -methylene blue (injected into a vein) -pimozide -thioridazine -ziprasidone This medicine may also interact with the following medications: -alcohol -amphetamines -aspirin and aspirin-like medicines -carbamazepine -certain medicines for depression, anxiety, or psychotic disturbances -certain medicines for infections like chloroquine, clarithromycin, erythromycin, furazolidone, isoniazid, pentamidine -certain medicines for migraine headaches like almotriptan, eletriptan, frovatriptan, naratriptan, rizatriptan, sumatriptan, zolmitriptan -certain medicines for sleep -certain medicines that treat or prevent blood clots like dalteparin, enoxaparin, warfarin -cimetidine -diuretics -fentanyl -lithium -methadone -metoprolol -NSAIDs, medicines for pain and inflammation, like ibuprofen or naproxen -omeprazole -other medicines that prolong the QT interval (cause an abnormal heart rhythm) -procarbazine -rasagiline -supplements like St. John's wort, kava kava, valerian -tramadol -tryptophan This list may not describe all possible interactions. Give your health care provider a list of all the medicines, herbs, non-prescription drugs, or dietary supplements you use. Also tell them if you smoke, drink alcohol, or use illegal drugs. Some items may interact with your medicine. What should I watch for while using this medicine? Tell your doctor if your symptoms do not get better or if they  get worse. Visit your doctor or health care professional for regular checks on your progress. Because it may take several weeks to see the full   effects of this medicine, it is important to continue your treatment as prescribed by your doctor. Patients and their families should watch out for new or worsening thoughts of suicide or depression. Also watch out for sudden changes in feelings such as feeling anxious, agitated, panicky, irritable, hostile, aggressive, impulsive, severely restless, overly excited and hyperactive, or not being able to sleep. If this happens, especially at the beginning of treatment or after a change in dose, call your health care professional. You may get drowsy or dizzy. Do not drive, use machinery, or do anything that needs mental alertness until you know how this medicine affects you. Do not stand or sit up quickly, especially if you are an older patient. This reduces the risk of dizzy or fainting spells. Alcohol may interfere with the effect of this medicine. Avoid alcoholic drinks. Your mouth may get dry. Chewing sugarless gum or sucking hard candy, and drinking plenty of water will help. Contact your doctor if the problem does not go away or is severe. What side effects may I notice from receiving this medicine? Side effects that you should report to your doctor or health care professional as soon as possible: -allergic reactions like skin rash, itching or hives, swelling of the face, lips, or tongue -anxious -black, tarry stools -breathing problems -changes in vision -chest pain -confusion -elevated mood, decreased need for sleep, racing thoughts, impulsive behavior -eye pain -fast, irregular heartbeat -feeling faint or lightheaded, falls -feeling agitated, angry, or irritable -hallucination, loss of contact with reality -loss of balance or coordination -loss of memory -painful or prolonged erections -restlessness, pacing, inability to keep  still -seizures -stiff muscles -suicidal thoughts or other mood changes -trouble sleeping -unusual bleeding or bruising -unusually weak or tired -vomiting Side effects that usually do not require medical attention (report to your doctor or health care professional if they continue or are bothersome): -change in appetite or weight -change in sex drive or performance -dizziness -headache -increased sweating -indigestion, nausea -tremors This list may not describe all possible side effects. Call your doctor for medical advice about side effects. You may report side effects to FDA at 1-800-FDA-1088. Where should I keep my medicine? Keep out of reach of children. Store at room temperature between 15 and 30 degrees C (59 and 86 degrees F). Throw away any unused medicine after the expiration date. NOTE: This sheet is a summary. It may not cover all possible information. If you have questions about this medicine, talk to your doctor, pharmacist, or health care provider.  2019 Elsevier/Gold Standard (2015-09-17 13:18:52)  

## 2018-04-29 NOTE — Progress Notes (Signed)
[facility] [facility] MD OP Progress Note  04/29/2018 5:26 PM Connie [name]  MRN:  [id number]  Chief Complaint: ' I am here for follow up.' Chief Complaint    Follow-up     HPI: Connie [name] is a 63 year old Caucasian female, married, on SSD, lives in [place], has a history of depression, history of delusional parasitosis, chronic pain, fibromyalgia, hypothyroidism, hypertension, polyarthralgia, presented to the clinic today for a follow-up visit.  Patient has limited mobility and hence walks with the help of a walker is stooped over while walking.  She also has chronic pain syndrome.  Patient today reports she has been taking the Cymbalta as prescribed last visit.  She reports that Cymbalta may be helping with her mood symptoms in general.  She reports her anxiety and depressive symptoms have improved.  She however reports she cannot tolerate the side effects of Cymbalta.  She reports it makes her extremely drowsy and tired during the day.  She takes it at night however it continues to make her groggy and tired throughout the day.  Patient hence would like her medication readjusted.  Discussed adding Celexa.  She agrees with plan.  Patient continues to be in psychotherapy with Elmyra Ricks which is going well.  She reports she had some conflict recently with her daughter-in-law which kind of affected her Christmas holidays.  She reports she is working on it with her therapist.  Patient denies any suicidality, sleep problems or perceptual disturbances at this time. Visit Diagnosis:    ICD-10-CM   1. MDD (major depressive disorder), recurrent episode, moderate (HCC) F33.1   2. Insomnia due to mental disorder F51.05     Past Psychiatric History: I have reviewed past psychiatric history from my progress note on 08/06/2017.  Past trials of Latuda, Wellbutrin, Cymbalta  Past Medical History:  Past Medical History:  Diagnosis Date  . Anemia    in the past  . Anxiety   . Arthritis    all over  . Cervical spondylosis  with myelopathy 2018  . Chronic pain 2019   can't stand straight, uses walker for stability  . DDD (degenerative disc disease), lumbar   . Depression   . Displacement of cervical intervertebral disc 2018  . Dyspnea    HANDICAPPED  . Fibromyalgia    Polymyalgia Rheumatica  . GERD (gastroesophageal reflux disease)   . Heart murmur    not treated or being followed  . Hypertension    CONTROLLED  . Hypothyroidism   . Lumbar post-laminectomy syndrome 2018  . Lumbosacral radiculitis 2018  . Neuropathy due to medical condition (HCC)    feet, toes, fingers  . Osteoarthritis   . Osteoporosis   . Other long term (current) drug therapy 2019   from pain management  . Pain in the coccyx 2018   disorder of sacrum  . Spondylolisthesis 2018  . Spondylosis of lumbosacral region without myelopathy or radiculopathy 2018  . Spondylosis with myelopathy, lumbar region 2018  . Vitamin D deficiency     Past Surgical History:  Procedure Laterality Date  . ABDOMINAL HYSTERECTOMY  2000  . APPENDECTOMY    . arm surgery  2002  . BREAST BIOPSY Left 2005   benign  . CATARACT EXTRACTION W/PHACO Right 10/21/2017   Procedure: CATARACT EXTRACTION PHACO AND INTRAOCULAR LENS PLACEMENT ([place]) RIGHT;  Surgeon: Leandrew Koyanagi, MD;  Location: [place];  Service: Ophthalmology;  Laterality: Right;  neck issues  . CESAREAN SECTION     x 2  . CHOLECYSTECTOMY    .  COLONOSCOPY WITH PROPOFOL N/A 08/25/2017   Procedure: COLONOSCOPY WITH PROPOFOL;  Surgeon: Toledo, Benay Pike, MD;  Location: [facility];  Service: Gastroenterology;  Laterality: N/A;  . epidural injection  2018   steroids  . GASTRIC BYPASS  2004  . JOINT REPLACEMENT Bilateral 2005   knees  . LUMBAR FUSION  2016   x 2/ L3-5  . SHOULDER ARTHROSCOPY WITH ROTATOR CUFF REPAIR Right 2008   x 3  . SPINAL CORD STIMULATOR IMPLANT  2018   [facility]  . TEMPORAL ARTERY BIOPSY / LIGATION    . TONSILLECTOMY      Family Psychiatric  History: Reviewed family psychiatric history from my progress note on 08/06/2017.  Family History:  Family History  Problem Relation Age of Onset  . Kidney disease Mother   . Cancer Mother   . Anxiety disorder Mother   . Depression Mother   . Breast cancer Mother   . Cancer Father   . Hypertension Father   . Cancer Brother   . Breast cancer Maternal Aunt   . Breast cancer Cousin     Social History: Reviewed social history from my progress note on 08/06/2017. Social History   Socioeconomic History  . Marital status: Married    Spouse name: brady   . Number of children: 2  . Years of education: Not on file  . Highest education level: High school graduate  Occupational History    Comment: disabled   Social Needs  . Financial resource strain: Somewhat hard  . Food insecurity:    Worry: Never true    Inability: Never true  . Transportation needs:    Medical: No    Non-medical: No  Tobacco Use  . Smoking status: Former Smoker    Packs/day: 0.50    Years: 20.00    Pack years: 10.00    Types: Cigarettes    Last attempt to quit: 05/21/2007    Years since quitting: 10.9  . Smokeless tobacco: Never Used  Substance and Sexual Activity  . Alcohol use: No    Frequency: Never  . Drug use: No    Comment: managed by pain clinic  . Sexual activity: Not Currently  Lifestyle  . Physical activity:    Days per week: 0 days    Minutes per session: 0 min  . Stress: Not on file  Relationships  . Social connections:    Talks on phone: Three times a week    Gets together: Once a week    Attends religious service: More than 4 times per year    Active member of club or organization: No    Attends meetings of clubs or organizations: Never    Relationship status: Married  Other Topics Concern  . Not on file  Social History Narrative  . Not on file    Allergies:  Allergies  Allergen Reactions  . Tape Rash    Skin peels off and blisters.  Probably was clear plastic tape.     Metabolic Disorder Labs: No results found for: HGBA1C, MPG No results found for: PROLACTIN No results found for: CHOL, TRIG, HDL, CHOLHDL, VLDL, LDLCALC No results found for: TSH  Therapeutic Level Labs: No results found for: LITHIUM No results found for: VALPROATE No components found for:  CBMZ  Current Medications: Current Outpatient Medications  Medication Sig Dispense Refill  . alendronate (FOSAMAX) 70 MG tablet Take 70 mg by mouth once a week. Every Sunday    . buPROPion (WELLBUTRIN XL) 300 MG  24 hr tablet Take 1 tablet (300 mg total) by mouth daily. 90 tablet 1  . celecoxib (CELEBREX) 200 MG capsule Take 200 mg by mouth daily. AM    . citalopram (CELEXA) 10 MG tablet Take 0.5 tablets (5 mg total) by mouth daily with supper. 15 tablet 1  . furosemide (LASIX) 20 MG tablet Take 20 mg by mouth as needed.    . gabapentin (NEURONTIN) 300 MG capsule Take 1 capsule (300 mg total) by mouth 2 (two) times daily. 60 capsule 5  . glycopyrrolate (ROBINUL) 1 MG tablet Take 1 mg by mouth 2 (two) times daily. AM AND HS  3  . hydrOXYzine (ATARAX/VISTARIL) 10 MG tablet Take 1-2 tablets (10-20 mg total) by mouth at bedtime as needed for anxiety. 180 tablet 1  . ibuprofen (ADVIL,MOTRIN) 200 MG tablet Take 400 mg by mouth daily as needed for headache or moderate pain.    Marland Kitchen levorphanol (LEVODROMORAN) 2 MG tablet 3 mg in am, 2 mg in afternoon, and 2mg  evening For chronic pain To last 30 days from fill date Fill on or after: 05/03/2018, 06/02/2018, 06/30/2018 105 tablet 0  . levothyroxine (SYNTHROID, LEVOTHROID) 25 MCG tablet Take 25 mcg by mouth once daily before breakfast  0  . lipase/protease/amylase (CREON) 36000 UNITS CPEP capsule Take 36,000 Units by mouth 3 (three) times daily with meals.    Marland Kitchen loratadine (CLARITIN) 10 MG tablet Take 10 mg by mouth daily as needed for allergies.    . metoprolol succinate (TOPROL-XL) 100 MG 24 hr tablet Take 100 mg by mouth once daily in the evening  0  .  ondansetron (ZOFRAN-ODT) 8 MG disintegrating tablet Take 8 mg by mouth daily as needed for nausea or vomiting.    . pantoprazole (PROTONIX) 40 MG tablet Take 40 mg by mouth once daily in the evening    . predniSONE (DELTASONE) 5 MG tablet Take 4 mg by mouth daily.    . traZODone (DESYREL) 100 MG tablet TAKE 1 TO 2 TABLETS(100 TO 200 MG) BY MOUTH AT BEDTIME AS NEEDED FOR SLEEP 180 tablet 0  . vitamin B-12 (CYANOCOBALAMIN) 1000 MCG tablet Take 1,000 mcg by mouth daily.    . Vitamin D, Ergocalciferol, (DRISDOL) 50000 units CAPS capsule Take one capsule once a week for 12 weeks     No current facility-administered medications for this visit.      Musculoskeletal: Strength & Muscle Tone: within normal limits Gait & Station: walker Patient leans: Front  Psychiatric Specialty Exam: Review of Systems  Constitutional: Positive for malaise/fatigue.  Psychiatric/Behavioral: Positive for depression. The patient is nervous/anxious.   All other systems reviewed and are negative.   Blood pressure (!) 161/83, pulse (!) 55, temperature 98.8 F (37.1 C), temperature source Oral, weight 188 lb 6.4 oz (85.5 kg).Body mass index is 36.79 kg/m.  General Appearance: Casual  Eye Contact:  Fair  Speech:  Normal Rate  Volume:  Normal  Mood:  Anxious and Dysphoric  Affect:  Appropriate  Thought Process:  Goal Directed and Descriptions of Associations: Intact  Orientation:  Full (Time, Place, and Person)  Thought Content: Logical   Suicidal Thoughts:  No  Homicidal Thoughts:  No  Memory:  Immediate;   Fair Recent;   Fair Remote;   Fair  Judgement:  Fair  Insight:  Fair  Psychomotor Activity:  Normal  Concentration:  Concentration: Fair and Attention Span: Fair  Recall:  [employer] of Knowledge: Fair  Language: Fair  Akathisia:  No  Handed:  Right  AIMS (if indicated): Denies tremors, rigidity, stiffness.  Assets:  Communication Skills Desire for Improvement Social Support  ADL's:  Intact   Cognition: WNL  Sleep:  Fair   Screenings: PHQ2-9     Clinical Support from 04/15/2018 in [facility] Clinical Support from 01/27/2018 in [place] Clinical Support from 11/05/2017 in [facility] Procedure visit from 08/19/2017 in [facility] Clinical Support from 08/04/2017 in [facility]  PHQ-2 Total Score  0  0  0  0  0       Assessment and Plan: Connie [name] is a 63 year old Caucasian female who has a history of depression, chronic pain, GERD, hypothyroidism, hypertension, fibromyalgia, presented to the clinic today for a follow-up visit.  Patient today reports side effects to the Cymbalta although it helps with her mood symptoms.  Continues to have psychosocial stressors of relationship conflicts.  She will continue to benefit from medication readjustment as well as psychotherapy sessions.  Plan MDD Continue Wellbutrin XL 300 mg p.o. daily Discontinue Cymbalta for side effects. Start Celexa 5 mg p.o. daily with supper Continue CBT with Ms. Peacock.  Anxiety disorder unspecified Start Celexa 5 mg p.o. daily. Continue CBT  For insomnia Trazodone 100 to 200 mg p.o. nightly as needed Hydroxyzine 10 to 20 mg p.o. nightly as needed  For bereavement Continue psychotherapy  Memory problems Completed an MMSE-29 out of 30 on 03/16/2018. Patient had TSH done- 12/22/2017-within normal limits. She has a history of vitamin B12 deficiency currently on replacement. We will continue to monitor her closely.  Follow-up in clinic in 3 to 4 weeks or sooner if needed.  More than 50 % of the time was spent for psychoeducation and supportive psychotherapy and care coordination.  This note was generated in part or whole with voice recognition software. Voice recognition is usually quite  accurate but there are transcription errors that can and very often do occur. I apologize for any typographical errors that were not detected and corrected.       Ursula Alert, MD 04/29/2018, 5:26 PM

## 2018-05-01 ENCOUNTER — Other Ambulatory Visit: Payer: Self-pay | Admitting: Psychiatry

## 2018-05-12 DIAGNOSIS — R413 Other amnesia: Secondary | ICD-10-CM | POA: Insufficient documentation

## 2018-05-12 DIAGNOSIS — M25511 Pain in right shoulder: Secondary | ICD-10-CM

## 2018-05-12 DIAGNOSIS — M25512 Pain in left shoulder: Secondary | ICD-10-CM

## 2018-05-12 DIAGNOSIS — G8929 Other chronic pain: Secondary | ICD-10-CM | POA: Insufficient documentation

## 2018-05-26 ENCOUNTER — Ambulatory Visit (INDEPENDENT_AMBULATORY_CARE_PROVIDER_SITE_OTHER): Payer: 59 | Admitting: Psychiatry

## 2018-05-26 ENCOUNTER — Encounter: Payer: Self-pay | Admitting: Psychiatry

## 2018-05-26 ENCOUNTER — Ambulatory Visit (INDEPENDENT_AMBULATORY_CARE_PROVIDER_SITE_OTHER): Payer: 59 | Admitting: Licensed Clinical Social Worker

## 2018-05-26 VITALS — BP 161/78 | HR 66 | Ht 60.0 in | Wt 189.0 lb

## 2018-05-26 DIAGNOSIS — F419 Anxiety disorder, unspecified: Secondary | ICD-10-CM | POA: Diagnosis not present

## 2018-05-26 DIAGNOSIS — F331 Major depressive disorder, recurrent, moderate: Secondary | ICD-10-CM | POA: Diagnosis not present

## 2018-05-26 DIAGNOSIS — R413 Other amnesia: Secondary | ICD-10-CM

## 2018-05-26 DIAGNOSIS — F5105 Insomnia due to other mental disorder: Secondary | ICD-10-CM

## 2018-05-26 DIAGNOSIS — Z634 Disappearance and death of family member: Secondary | ICD-10-CM

## 2018-05-26 MED ORDER — CITALOPRAM HYDROBROMIDE 10 MG PO TABS: 10.0000 mg | ORAL_TABLET | Freq: Every day | ORAL | 0 refills | Status: DC

## 2018-05-26 NOTE — Progress Notes (Addendum)
BH MD OP Progress Note  05/26/2018 5:42 PM Connie [name]  MRN:  [id number]  Chief Complaint: ' I am here for follow up." Chief Complaint    Follow-up     HPI: Connie [name] is a 63 year old Caucasian female, married, on SSD, lives in [place], has a history of depression, history of delusional parasitosis, chronic pain, fibromyalgia, hypothyroidism, hypertension, polyarthralgia, presented to the clinic today for a follow-up visit.  Patient today reports that she continues to struggle with some depressive symptoms like sadness, lack of motivation, low energy.  She is tolerating the Celexa well.  She denies any side effects.  She reports sleep is good on the trazodone.  Patient reports psychotherapy sessions with patient is going well.  Patient continues to struggle with multiple medical problems and currently struggles with lower  extremity swelling.  Patient reports that has been affecting her mood since it slows her down.  She continues to work with rheumatologist on the same.  She is also in physical therapy.  Patient with elevated blood pressure reading today, reports it is likely due to her swelling and will follow-up with her providers.   Visit Diagnosis:    ICD-10-CM   1. MDD (major depressive disorder), recurrent episode, moderate (HCC) F33.1   2. Insomnia due to mental disorder F51.05   3. Anxiety disorder, unspecified type F41.9   4. Bereavement Z63.4   5. Memory loss R41.3     Past Psychiatric History: Reviewed past psychiatric history from my progress note on 08/06/2017.  Past trials of Latuda, Wellbutrin, Cymbalta.       Past Medical History:  Past Medical History:  Diagnosis Date  . Anemia    in the past  . Anxiety   . Arthritis    all over  . Cervical spondylosis with myelopathy 2018  . Chronic pain 2019   can't stand straight, uses walker for stability  . DDD (degenerative disc disease), lumbar   . Depression   . Displacement of cervical intervertebral disc 2018   . Dyspnea    HANDICAPPED  . Fibromyalgia    Polymyalgia Rheumatica  . GERD (gastroesophageal reflux disease)   . Heart murmur    not treated or being followed  . Hypertension    CONTROLLED  . Hypothyroidism   . Lumbar post-laminectomy syndrome 2018  . Lumbosacral radiculitis 2018  . Neuropathy due to medical condition (HCC)    feet, toes, fingers  . Osteoarthritis   . Osteoporosis   . Other long term (current) drug therapy 2019   from pain management  . Pain in the coccyx 2018   disorder of sacrum  . Spondylolisthesis 2018  . Spondylosis of lumbosacral region without myelopathy or radiculopathy 2018  . Spondylosis with myelopathy, lumbar region 2018  . Vitamin D deficiency     Past Surgical History:  Procedure Laterality Date  . ABDOMINAL HYSTERECTOMY  2000  . APPENDECTOMY    . arm surgery  2002  . BREAST BIOPSY Left 2005   benign  . CATARACT EXTRACTION W/PHACO Right 10/21/2017   Procedure: CATARACT EXTRACTION PHACO AND INTRAOCULAR LENS PLACEMENT ([place]) RIGHT;  Surgeon: Leandrew Koyanagi, MD;  Location: [facility];  Service: Ophthalmology;  Laterality: Right;  neck issues  . CESAREAN SECTION     x 2  . CHOLECYSTECTOMY    . COLONOSCOPY WITH PROPOFOL N/A 08/25/2017   Procedure: COLONOSCOPY WITH PROPOFOL;  Surgeon: Toledo, Benay Pike, MD;  Location: [facility];  Service: Gastroenterology;  Laterality: N/A;  . epidural  injection  2018   steroids  . GASTRIC BYPASS  2004  . JOINT REPLACEMENT Bilateral 2005   knees  . LUMBAR FUSION  2016   x 2/ L3-5  . SHOULDER ARTHROSCOPY WITH ROTATOR CUFF REPAIR Right 2008   x 3  . SPINAL CORD STIMULATOR IMPLANT  2018   [facility] scientific  . TEMPORAL ARTERY BIOPSY / LIGATION    . TONSILLECTOMY      Family Psychiatric History: Reviewed family psychiatric history from my progress note on 08/06/2017.  Family History:  Family History  Problem Relation Age of Onset  . Kidney disease Mother   . Cancer Mother   . Anxiety  disorder Mother   . Depression Mother   . Breast cancer Mother   . Cancer Father   . Hypertension Father   . Cancer Brother   . Breast cancer Maternal Aunt   . Breast cancer Cousin     Social History: Reviewed social history from my progress note on 08/06/2017. Social History   Socioeconomic History  . Marital status: Married    Spouse name: brady   . Number of children: 2  . Years of education: Not on file  . Highest education level: High school graduate  Occupational History    Comment: disabled   Social Needs  . Financial resource strain: Somewhat hard  . Food insecurity:    Worry: Never true    Inability: Never true  . Transportation needs:    Medical: No    Non-medical: No  Tobacco Use  . Smoking status: Former Smoker    Packs/day: 0.50    Years: 20.00    Pack years: 10.00    Types: Cigarettes    Last attempt to quit: 05/21/2007    Years since quitting: 11.0  . Smokeless tobacco: Never Used  Substance and Sexual Activity  . Alcohol use: No    Frequency: Never  . Drug use: No    Comment: managed by pain clinic  . Sexual activity: Not Currently  Lifestyle  . Physical activity:    Days per week: 0 days    Minutes per session: 0 min  . Stress: Not on file  Relationships  . Social connections:    Talks on phone: Three times a week    Gets together: Once a week    Attends religious service: More than 4 times per year    Active member of club or organization: No    Attends meetings of clubs or organizations: Never    Relationship status: Married  Other Topics Concern  . Not on file  Social History Narrative  . Not on file    Allergies:  Allergies  Allergen Reactions  . Tape Rash    Skin peels off and blisters.  Probably was clear plastic tape.    Metabolic Disorder Labs: No results found for: HGBA1C, MPG No results found for: PROLACTIN No results found for: CHOL, TRIG, HDL, CHOLHDL, VLDL, LDLCALC No results found for: TSH  Therapeutic Level  Labs: No results found for: LITHIUM No results found for: VALPROATE No components found for:  CBMZ  Current Medications: Current Outpatient Medications  Medication Sig Dispense Refill  . buPROPion (WELLBUTRIN XL) 300 MG 24 hr tablet Take 1 tablet (300 mg total) by mouth daily. 90 tablet 1  . celecoxib (CELEBREX) 200 MG capsule Take 200 mg by mouth daily. AM    . citalopram (CELEXA) 10 MG tablet Take 1 tablet (10 mg total) by mouth daily with supper.  Take 1 tablet for 3 weeks and increase to 1.5 tablets if you still have depressive symptoms 90 tablet 0  . furosemide (LASIX) 20 MG tablet Take 20 mg by mouth as needed.    . gabapentin (NEURONTIN) 300 MG capsule Take 1 capsule (300 mg total) by mouth 2 (two) times daily. 60 capsule 5  . glycopyrrolate (ROBINUL) 1 MG tablet Take 1 mg by mouth 2 (two) times daily. AM AND HS  3  . glycopyrrolate (ROBINUL) 1 MG tablet TAKE 1 TABLET(1 MG) BY MOUTH TWICE DAILY    . hydrOXYzine (ATARAX/VISTARIL) 10 MG tablet Take 1-2 tablets (10-20 mg total) by mouth at bedtime as needed for anxiety. 180 tablet 1  . ibuprofen (ADVIL,MOTRIN) 200 MG tablet Take 400 mg by mouth daily as needed for headache or moderate pain.    Marland Kitchen levorphanol (LEVODROMORAN) 2 MG tablet 3 mg in am, 2 mg in afternoon, and 2mg  evening For chronic pain To last 30 days from fill date Fill on or after: 05/03/2018, 06/02/2018, 06/30/2018 105 tablet 0  . levothyroxine (SYNTHROID, LEVOTHROID) 25 MCG tablet Take 25 mcg by mouth once daily before breakfast  0  . loratadine (CLARITIN) 10 MG tablet Take 10 mg by mouth daily as needed for allergies.    . metoprolol succinate (TOPROL-XL) 100 MG 24 hr tablet Take 100 mg by mouth once daily in the evening  0  . metoprolol succinate (TOPROL-XL) 100 MG 24 hr tablet Take by mouth.    . ondansetron (ZOFRAN-ODT) 8 MG disintegrating tablet Take 8 mg by mouth daily as needed for nausea or vomiting.    . pantoprazole (PROTONIX) 40 MG tablet Take 40 mg by mouth once  daily in the evening    . traZODone (DESYREL) 100 MG tablet TAKE 1 TO 2 TABLETS(100 TO 200 MG) BY MOUTH AT BEDTIME AS NEEDED FOR SLEEP 180 tablet 0  . predniSONE (DELTASONE) 5 MG tablet Take 4 mg by mouth daily.     No current facility-administered medications for this visit.      Musculoskeletal: Strength & Muscle Tone: within normal limits Gait & Station: walks with walker Patient leans: Front  Psychiatric Specialty Exam: Review of Systems  Psychiatric/Behavioral: Positive for depression.  All other systems reviewed and are negative.   Blood pressure (!) 161/78, pulse 66, height 5' (1.524 m), weight 189 lb (85.7 kg).Body mass index is 36.91 kg/m.  General Appearance: Casual  Eye Contact:  Fair  Speech:  Normal Rate  Volume:  Normal  Mood:  Depressed  Affect:  Appropriate  Thought Process:  Goal Directed and Descriptions of Associations: Intact  Orientation:  Full (Time, Place, and Person)  Thought Content: Logical   Suicidal Thoughts:  No  Homicidal Thoughts:  No  Memory:  Immediate;   Fair Recent;   Fair Remote;   Fair  Judgement:  Fair  Insight:  Fair  Psychomotor Activity:  Normal  Concentration:  Concentration: Fair and Attention Span: Fair  Recall:  [employer] of Knowledge: Fair  Language: Fair  Akathisia:  No  Handed:  Right  AIMS (if indicated): denies tremors, rigidity  Assets:  Communication Skills Desire for Improvement Social Support  ADL's:  Intact  Cognition: WNL  Sleep:  Fair   Screenings: PHQ2-9     Clinical Support from 04/15/2018 in [facility] Clinical Support from 01/27/2018 in [facility] from 11/05/2017 in [facility]  Procedure visit from 08/19/2017 in [facility] Clinical Support from 08/04/2017 in [facility]   PHQ-2 Total Score  0  0  0  0  0       Assessment and Plan: Connie [name] is a 63 year old Caucasian female who has a history of depression, chronic pain, GERD, hypothyroidism, hypertension, fibromyalgia, presented to clinic today for a follow-up visit.  Patient reports she continues to struggle with some depressive symptoms like fatigue, sadness.  She is tolerating Celexa well.  We will continue to make medication readjustment   Plan MDD-unstable Continue Wellbutrin XL 300 mg p.o. daily Increase Celexa to 10 mg for 3 weeks and to 15 mg after that. Continue CBT with Ms. Peacock  Anxiety disorder -unstable Celexa as prescribed. Continue CBT  For insomnia - improving Trazodone 100 to 200 mg p.o. nightly as needed Hydroxyzine 10 to 20 mg p.o. nightly as needed  For bereavement-improving Continue psychotherapy  For memory problems MMSE completed on 03/16/2018-29 out of 30 Patient had TSH done-12/22/2017-within normal limits She is currently on vitamin B12 replacement. We will continue to monitor closely.  Follow-up in clinic in 6 weeks or sooner if needed.  I have spent atleast 15 minutes face to face with patient today. More than 50 % of the time was spent for psychoeducation and supportive psychotherapy and care coordination.  This note was generated in part or whole with voice recognition software. Voice recognition is usually quite accurate but there are transcription errors that can and very often do occur. I apologize for any typographical errors that were not detected and corrected.          Ursula Alert, MD 05/26/2018, 5:42 PM

## 2018-06-04 ENCOUNTER — Encounter

## 2018-06-06 MED ORDER — METOPROLOL SUCCINATE SR 100 MG 24 HR TAB
100 mg | ORAL_TABLET | ORAL | 0 refills | Status: AC
Start: 2018-06-06 — End: ?

## 2018-07-13 ENCOUNTER — Ambulatory Visit
Payer: Managed Care, Other (non HMO) | Attending: Student in an Organized Health Care Education/Training Program | Admitting: Student in an Organized Health Care Education/Training Program

## 2018-07-13 ENCOUNTER — Other Ambulatory Visit: Payer: Self-pay

## 2018-07-13 ENCOUNTER — Encounter: Payer: Self-pay | Admitting: Student in an Organized Health Care Education/Training Program

## 2018-07-13 VITALS — BP 138/64 | HR 57 | Temp 98.3°F | Resp 16 | Wt 189.0 lb

## 2018-07-13 DIAGNOSIS — Z981 Arthrodesis status: Secondary | ICD-10-CM | POA: Diagnosis not present

## 2018-07-13 DIAGNOSIS — M47812 Spondylosis without myelopathy or radiculopathy, cervical region: Secondary | ICD-10-CM | POA: Diagnosis not present

## 2018-07-13 DIAGNOSIS — F329 Major depressive disorder, single episode, unspecified: Secondary | ICD-10-CM | POA: Diagnosis present

## 2018-07-13 DIAGNOSIS — M47816 Spondylosis without myelopathy or radiculopathy, lumbar region: Secondary | ICD-10-CM

## 2018-07-13 DIAGNOSIS — G894 Chronic pain syndrome: Secondary | ICD-10-CM | POA: Diagnosis present

## 2018-07-13 DIAGNOSIS — M51369 Other intervertebral disc degeneration, lumbar region without mention of lumbar back pain or lower extremity pain: Secondary | ICD-10-CM

## 2018-07-13 DIAGNOSIS — M797 Fibromyalgia: Secondary | ICD-10-CM | POA: Diagnosis present

## 2018-07-13 DIAGNOSIS — M5136 Other intervertebral disc degeneration, lumbar region: Secondary | ICD-10-CM

## 2018-07-13 MED ORDER — LEVORPHANOL TARTRATE 2 MG PO TABS: ORAL_TABLET | ORAL | 0 refills | Status: DC

## 2018-07-13 NOTE — Patient Instructions (Signed)
You have been given 3 Rx for Levorphanol to last until 10/27/2018.

## 2018-07-13 NOTE — Progress Notes (Signed)
Patient's Name: Connie [name]  MRN: [id number]  Referring Provider: Tracie Harrier, MD  DOB: Nov 17, 1955  PCP: Tracie Harrier, MD  DOS: 07/13/2018  Note by: [name], MD  Service setting: Ambulatory outpatient  Specialty: Interventional Pain Management  Location: [facility] (AMB) Pain Management Facility    Patient type: Established   Primary Reason(s) for Visit: Encounter for prescription drug management. (Level of risk: moderate)  CC: Back Pain (low) and Leg Pain (cramping, bilaterally)  HPI  Connie [name] is a 63 y.o. year old, female patient, who comes today for a medication management evaluation. She has Degenerative joint disease of cervical and lumbar spine; Lumbar spondylosis; History of lumbar fusion; Lumbar degenerative disc disease; Fibromyalgia; Cervicalgia; DDD (degenerative disc disease), cervical; Chronic pain syndrome; Major depressive disorder; Spinal cord stimulator status; Chiari malformation type I ([place]); Adiposis dolorosa; Acquired spondylolisthesis; Chronic constipation; Chronic midline low back pain; Chronic pain; Daytime somnolence; Delusions of parasitosis ([place]); Diastolic dysfunction; Dry eyes, bilateral; Dysphagia, oropharyngeal phase; Edema; GERD without esophagitis; Generalized osteoarthritis of multiple sites; Fluid collection at surgical site; Fatty liver; Fatigue; Essential hypertension; Internal hemorrhoid; Insomnia; Hypothyroidism; H/O gastric bypass; Seasonal allergies; Rotator cuff arthropathy of both shoulders; Osteoporosis; Obesity, morbid ([place]); Nonunion of fracture; Morgellons disease; Medication-induced delirium, acute, hyperactive ([place]); Lumbar pseudoarthrosis; Leg cramps; Iron deficiency; Joint pain; Spinal stenosis of lumbar region without neurogenic claudication; Arthritis; Complete tear of right rotator cuff; OA (osteoarthritis); Rotator cuff tendinitis, right; Lumbar post-laminectomy syndrome; Lumbar radiculopathy; Cervical radiculopathy; Bilateral hand  pain; Candidiasis of skin and nails; CRP elevated; Jaw pain, non-TMJ; Lumbosacral radiculitis; Lumbosacral spondylosis without myelopathy; Pain in the coccyx; Menopausal symptom; Temporal headache; Stiffness of shoulder joint; SOBOE (shortness of breath on exertion); Screening for osteoporosis; Vitamin D deficiency, unspecified; Osteoporosis, post-menopausal; Memory problem; and Chronic pain of both shoulders on their problem list. Her primarily concern today is the Back Pain (low) and Leg Pain (cramping, bilaterally)  Pain Assessment: Location: Lower Back Radiating: denies Onset: More than a month ago Duration: Chronic pain Quality: Constant Severity: 7 /10 (subjective, self-reported pain score)  Note: Reported level is inconsistent with clinical observations.                         When using our objective Pain Scale, levels between 6 and 10/10 are said to belong in an emergency room, as it progressively worsens from a 6/10, described as severely limiting, requiring emergency care not usually available at an outpatient pain management facility. At a 6/10 level, communication becomes difficult and requires great effort. Assistance to reach the emergency department may be required. Facial flushing and profuse sweating along with potentially dangerous increases in heart rate and blood pressure will be evident. Effect on ADL: limits daily activities, "not able to do much of anything" Timing: Constant Modifying factors: meds BP: 138/64  HR: (!) 57  Connie [name] was last scheduled for an appointment on 04/15/2018 for medication management. During today's appointment we reviewed Connie [name]'s chronic pain status, as well as her outpatient medication regimen. No changes in medical history.  Follows up for medication management.  Continues to see psychiatrist and therapist.  The patient  reports no history of drug use. Her body mass index is 36.91 kg/m.  Further details on both, my assessment(s), as  well as the proposed treatment plan, please see below.  Controlled Substance Pharmacotherapy Assessment REMS (Risk Evaluation and Mitigation Strategy)   06/30/2018  1   04/15/2018  Levorphanol 2 MG Tablet  105.00  30 Bi Lat   [id number]   [facility] ([id number])   0  77.00 MME  Comm Ins   [place]    Rise Patience, RN  07/13/2018  2:08 PM  Signed Nursing Pain Medication Assessment:  Safety precautions to be maintained throughout the outpatient stay will include: orient to surroundings, keep bed in low position, maintain call bell within reach at all times, provide assistance with transfer out of bed and ambulation.  Medication Inspection Compliance: Pill count conducted under aseptic conditions, in front of the patient. Neither the pills nor the bottle was removed from the patient's sight at any time. Once count was completed pills were immediately returned to the patient in their original bottle.  Medication: Levorphanol Pill/Patch Count: 63 of 105 pills remain Pill/Patch Appearance: Markings consistent with prescribed medication Bottle Appearance: Standard pharmacy container. Clearly labeled. Filled Date: 3 / 4 / 2020 Last Medication intake:  Today   Pharmacokinetics: Liberation and absorption (onset of action): WNL Distribution (time to peak effect): WNL Metabolism and excretion (duration of action): WNL         Pharmacodynamics: Desired effects: Analgesia: Connie [name] reports >50% benefit. Functional ability: Patient reports that medication allows her to accomplish basic ADLs Clinically meaningful improvement in function (CMIF): Sustained CMIF goals met Perceived effectiveness: Described as relatively effective, allowing for increase in activities of daily living (ADL) Undesirable effects: Side-effects or Adverse reactions: None reported Monitoring: [place] PMP: Online review of the past 42-monthperiod conducted. Compliant with practice rules and regulations Last UDS on record: Summary  Date Value Ref  Range Status  04/15/2018 FINAL  Final    Comment:    ==================================================================== TOXASSURE SELECT 13 (MW) ==================================================================== Test                             Result       Flag       Units   NO DRUGS DETECTED. ==================================================================== Test                      Result    Flag   Units      Ref Range   Creatinine              91               mg/dL      >=20 ==================================================================== Declared Medications:  The flagging and interpretation on this report are based on the  following declared medications.  Unexpected results may arise from  inaccuracies in the declared medications.  **Note: The testing scope of this panel does not include following  reported medications:  Alendronate (Fosamax)  Bupropion (Wellbutrin)  Celecoxib (Celebrex)  Cyanocobalamin  Duloxetine (Cymbalta)  Furosemide (Lasix)  Gabapentin (Neurontin)  Glycopyrrolate (Robinul)  Hydroxyzine  Ibuprofen  Levorphanol  Levothyroxine  Loratadine  Metoprolol  Ondansetron  Pancrelipase (Creon)  Pantoprazole (Protonix)  Prednisone (Deltasone)  Trazodone (Desyrel)  Vitamin D2 (Drisdol) ==================================================================== For clinical consultation, please call [phone] ====================================================================    UDS interpretation: Unexpected findings not considered significantly abnormal In this case, absence of medication may be secondary to sample timing with relation to PRN intake. Will repeat today- states that last intake was earlier today. Medication Assessment Form: Reviewed. Patient indicates being compliant with therapy Treatment compliance: Compliant Risk Assessment Profile: Aberrant behavior: See initial evaluations. None observed or detected today Comorbid factors  increasing risk of overdose: See initial evaluation. No additional risks detected  today Opioid risk tool (ORT):  Opioid Risk  07/13/2018  Alcohol 0  Illegal Drugs 0  Rx Drugs 0  Alcohol 0  Illegal Drugs 0  Rx Drugs 0  Age between 16-45 years  0  History of Preadolescent Sexual Abuse 0  Psychological Disease 0  Depression 1  Opioid Risk Tool Scoring 1  Opioid Risk Interpretation Low Risk    ORT Scoring interpretation table:  Score <3 = Low Risk for SUD  Score between 4-7 = Moderate Risk for SUD  Score >8 = High Risk for Opioid Abuse   Risk of substance use disorder (SUD): Low  Risk Mitigation Strategies:  Patient Counseling: Covered Patient-Prescriber Agreement (PPA): Present and active  Notification to other healthcare providers: Done  Pharmacologic Plan: No change in therapy, at this time.             Laboratory Chemistry  Inflammation Markers (CRP: Acute Phase) (ESR: Chronic Phase) No results found for: CRP, ESRSEDRATE, LATICACIDVEN                       Rheumatology Markers No results found for: RF, ANA, LABURIC, URICUR, LYMEIGGIGMAB, LYMEABIGMQN, HLAB27                      Renal Function Markers Lab Results  Component Value Date   BUN 18 06/23/2017   CREATININE 0.60 06/23/2017   GFRAA >60 06/23/2017   GFRNONAA >60 06/23/2017                             Hepatic Function Markers Lab Results  Component Value Date   AST 20 06/23/2017   ALT 9 (L) 06/23/2017   ALBUMIN 3.6 06/23/2017   ALKPHOS 88 06/23/2017                        Electrolytes Lab Results  Component Value Date   NA 135 06/23/2017   K 4.9 06/23/2017   CL 102 06/23/2017   CALCIUM 8.5 (L) 06/23/2017                        Neuropathy Markers No results found for: VITAMINB12, FOLATE, HGBA1C, HIV                      CNS Tests No results found for: COLORCSF, APPEARCSF, RBCCOUNTCSF, WBCCSF, POLYSCSF, LYMPHSCSF, EOSCSF, PROTEINCSF, GLUCCSF, JCVIRUS, CSFOLI, IGGCSF                      Bone  Pathology Markers No results found for: VD25OH, BC488QB1QXI, [id number], KL4917HX5, 25OHVITD1, 25OHVITD2, 25OHVITD3, TESTOFREE, TESTOSTERONE                       Coagulation Parameters Lab Results  Component Value Date   PLT 211 06/23/2017                        Cardiovascular Markers Lab Results  Component Value Date   HGB 11.8 (L) 06/23/2017   HCT 36.1 06/23/2017                         CA Markers No results found for: CEA, CA125, LABCA2  Endocrine Markers No results found for: TSH, FREET4, TESTOFREE, TESTOSTERONE, ESTRADIOL, ESTRADIOLPCT, ESTRADIOLFRE                      Note: Lab results reviewed.  Recent Diagnostic Imaging Results  DG C-Arm 1-60 Min-No Report Fluoroscopy was utilized by the requesting physician.  No radiographic  interpretation.   Complexity Note: Imaging results reviewed. Results shared with Connie [name], using Layman's terms.                               Meds   Current Outpatient Medications:  .  buPROPion (WELLBUTRIN XL) 300 MG 24 hr tablet, Take 1 tablet (300 mg total) by mouth daily., Disp: 90 tablet, Rfl: 1 .  celecoxib (CELEBREX) 200 MG capsule, Take 200 mg by mouth daily. AM, Disp: , Rfl:  .  citalopram (CELEXA) 10 MG tablet, Take 1 tablet (10 mg total) by mouth daily with supper. Take 1 tablet for 3 weeks and increase to 1.5 tablets if you still have depressive symptoms, Disp: 90 tablet, Rfl: 0 .  furosemide (LASIX) 20 MG tablet, Take 20 mg by mouth as needed., Disp: , Rfl:  .  gabapentin (NEURONTIN) 300 MG capsule, Take 1 capsule (300 mg total) by mouth 2 (two) times daily., Disp: 60 capsule, Rfl: 5 .  glycopyrrolate (ROBINUL) 1 MG tablet, Take 1 mg by mouth 2 (two) times daily. AM AND HS, Disp: , Rfl: 3 .  glycopyrrolate (ROBINUL) 1 MG tablet, TAKE 1 TABLET(1 MG) BY MOUTH TWICE DAILY, Disp: , Rfl:  .  hydrOXYzine (ATARAX/VISTARIL) 10 MG tablet, Take 1-2 tablets (10-20 mg total) by mouth at bedtime as needed for anxiety.,  Disp: 180 tablet, Rfl: 1 .  ibuprofen (ADVIL,MOTRIN) 200 MG tablet, Take 400 mg by mouth daily as needed for headache or moderate pain., Disp: , Rfl:  .  levorphanol (LEVODROMORAN) 2 MG tablet, 3 mg in am, 2 mg in afternoon, and 26m evening For chronic pain To last 30 days from fill date Fill on or after: 07/30/18, 08/28/18, 09/27/18, Disp: 105 tablet, Rfl: 0 .  levothyroxine (SYNTHROID, LEVOTHROID) 25 MCG tablet, Take 25 mcg by mouth once daily before breakfast, Disp: , Rfl: 0 .  loratadine (CLARITIN) 10 MG tablet, Take 10 mg by mouth daily as needed for allergies., Disp: , Rfl:  .  metoprolol succinate (TOPROL-XL) 100 MG 24 hr tablet, Take 100 mg by mouth once daily in the evening, Disp: , Rfl: 0 .  metoprolol succinate (TOPROL-XL) 100 MG 24 hr tablet, Take by mouth., Disp: , Rfl:  .  ondansetron (ZOFRAN-ODT) 8 MG disintegrating tablet, Take 8 mg by mouth daily as needed for nausea or vomiting., Disp: , Rfl:  .  pantoprazole (PROTONIX) 40 MG tablet, Take 40 mg by mouth once daily in the evening, Disp: , Rfl:  .  traZODone (DESYREL) 100 MG tablet, TAKE 1 TO 2 TABLETS(100 TO 200 MG) BY MOUTH AT BEDTIME AS NEEDED FOR SLEEP, Disp: 180 tablet, Rfl: 0 .  predniSONE (DELTASONE) 5 MG tablet, Take 4 mg by mouth daily., Disp: , Rfl:   ROS  Constitutional: Denies any fever or chills Gastrointestinal: No reported hemesis, hematochezia, vomiting, or acute GI distress Musculoskeletal: Denies any acute onset joint swelling, redness, loss of ROM, or weakness Neurological: No reported episodes of acute onset apraxia, aphasia, dysarthria, agnosia, amnesia, paralysis, loss of coordination, or loss of consciousness  Allergies  Connie [name]  is allergic to tape.  PFSH  Drug: Connie [name]  reports no history of drug use. Alcohol:  reports no history of alcohol use. Tobacco:  reports that she quit smoking about 11 years ago. Her smoking use included cigarettes. She has a 10.00 pack-year smoking history. She has never used  smokeless tobacco. Medical:  has a past medical history of Anemia, Anxiety, Arthritis, Cervical spondylosis with myelopathy (2018), Chronic pain (2019), DDD (degenerative disc disease), lumbar, Depression, Displacement of cervical intervertebral disc (2018), Dyspnea, Fibromyalgia, GERD (gastroesophageal reflux disease), Heart murmur, Hypertension, Hypothyroidism, Lumbar post-laminectomy syndrome (2018), Lumbosacral radiculitis (2018), Neuropathy due to medical condition ([place]), Osteoarthritis, Osteoporosis, Other long term (current) drug therapy (2019), Pain in the coccyx (2018), Spondylolisthesis (2018), Spondylosis of lumbosacral region without myelopathy or radiculopathy (2018), Spondylosis with myelopathy, lumbar region (2018), and Vitamin D deficiency. Surgical: Connie [name]  has a past surgical history that includes Cesarean section; Gastric bypass (2004); Lumbar fusion (2016); Spinal cord stimulator implant (2018); Cholecystectomy; Appendectomy; Tonsillectomy; Shoulder arthroscopy with rotator cuff repair (Right, 2008); Joint replacement (Bilateral, 2005); Abdominal hysterectomy (2000); epidural injection (2018); arm surgery (2002); Breast biopsy (Left, 2005); Colonoscopy with propofol (N/A, 08/25/2017); Temporal artery biopsy / ligation; and Cataract extraction w/PHACO (Right, 10/21/2017). Family: family history includes Anxiety disorder in her mother; Breast cancer in her cousin, maternal aunt, and mother; Cancer in her brother, father, and mother; Depression in her mother; Hypertension in her father; Kidney disease in her mother.  Constitutional Exam  General appearance: Well nourished, well developed, and well hydrated. In no apparent acute distress Vitals:   07/13/18 1355  BP: 138/64  Pulse: (!) 57  Resp: 16  Temp: 98.3 F (36.8 C)  TempSrc: Oral  SpO2: 98%  Weight: 189 lb (85.7 kg)   BMI Assessment: Estimated body mass index is 36.91 kg/m as calculated from the following:   Height as of  05/26/18: 5' (1.524 m).   Weight as of this encounter: 189 lb (85.7 kg).  BMI interpretation table: BMI level Category Range association with higher incidence of chronic pain  <18 kg/m2 Underweight   18.5-24.9 kg/m2 Ideal body weight   25-29.9 kg/m2 Overweight Increased incidence by 20%  30-34.9 kg/m2 Obese (Class I) Increased incidence by 68%  35-39.9 kg/m2 Severe obesity (Class II) Increased incidence by 136%  >40 kg/m2 Extreme obesity (Class III) Increased incidence by 254%   Patient's current BMI Ideal Body weight  Body mass index is 36.91 kg/m. Ideal body weight: 45.5 kg (100 lb 4.9 oz) Adjusted ideal body weight: 61.6 kg (135 lb 12.6 oz)   BMI Readings from Last 4 Encounters:  07/13/18 36.91 kg/m  05/26/18 36.91 kg/m  04/29/18 36.79 kg/m  04/15/18 34.96 kg/m   Wt Readings from Last 4 Encounters:  07/13/18 189 lb (85.7 kg)  05/26/18 189 lb (85.7 kg)  04/29/18 188 lb 6.4 oz (85.5 kg)  04/15/18 179 lb (81.2 kg)  Psych/Mental status: Alert, oriented x 3 (person, place, & time)       Eyes: PERLA Respiratory: No evidence of acute respiratory distress  Cervical Spine Area Exam  Skin & Axial Inspection: No masses, redness, edema, swelling, or associated skin lesions Alignment: Symmetrical Functional ROM: Unrestricted ROM      Stability: No instability detected Muscle Tone/Strength: Functionally intact. No obvious neuro-muscular anomalies detected. Sensory (Neurological): Unimpaired Palpation: No palpable anomalies              Upper Extremity (UE) Exam    Side: Right upper extremity  Side: Left  upper extremity  Skin & Extremity Inspection: Skin color, temperature, and hair growth are WNL. No peripheral edema or cyanosis. No masses, redness, swelling, asymmetry, or associated skin lesions. No contractures.  Skin & Extremity Inspection: Skin color, temperature, and hair growth are WNL. No peripheral edema or cyanosis. No masses, redness, swelling, asymmetry, or associated  skin lesions. No contractures.  Functional ROM: Unrestricted ROM          Functional ROM: Unrestricted ROM          Muscle Tone/Strength: Functionally intact. No obvious neuro-muscular anomalies detected.  Muscle Tone/Strength: Functionally intact. No obvious neuro-muscular anomalies detected.  Sensory (Neurological): Unimpaired          Sensory (Neurological): Unimpaired          Palpation: No palpable anomalies              Palpation: No palpable anomalies              Provocative Test(s):  Phalen's test: deferred Tinel's test: deferred Apley's scratch test (touch opposite shoulder):  Action 1 (Across chest): deferred Action 2 (Overhead): deferred Action 3 (LB reach): deferred   Provocative Test(s):  Phalen's test: deferred Tinel's test: deferred Apley's scratch test (touch opposite shoulder):  Action 1 (Across chest): deferred Action 2 (Overhead): deferred Action 3 (LB reach): deferred    Thoracic Spine Area Exam  Skin & Axial Inspection: No masses, redness, or swelling Alignment: Symmetrical Functional ROM: Unrestricted ROM Stability: No instability detected Muscle Tone/Strength: Functionally intact. No obvious neuro-muscular anomalies detected. Sensory (Neurological): Unimpaired Muscle strength & Tone: No palpable anomalies  Lumbar Spine Area Exam  Skin & Axial Inspection: Well healed scar from previous spine surgery detected Alignment: Symmetrical Functional ROM: Decreased ROM       Stability: No instability detected Muscle Tone/Strength: Functionally intact. No obvious neuro-muscular anomalies detected. Sensory (Neurological): Dermatomal pain pattern and musculoskeletal Palpation: Complains of area being tender to palpation       Provocative Tests: Hyperextension/rotation test: deferred today       Lumbar quadrant test (Kemp's test): deferred today       Lateral bending test: deferred today       Patrick's Maneuver: deferred today                   FABER* test:  deferred today                   S-I anterior distraction/compression test: deferred today         S-I lateral compression test: deferred today         S-I Thigh-thrust test: deferred today         S-I Gaenslen's test: deferred today         *(Flexion, ABduction and External Rotation)  Gait & Posture Assessment  Ambulation: Patient ambulates using a walker Gait: Limited. Using assistive device to ambulate Posture: Difficulty standing up straight, due to pain   Lower Extremity Exam    Side: Right lower extremity  Side: Left lower extremity  Stability: No instability observed          Stability: No instability observed          Skin & Extremity Inspection: Skin color, temperature, and hair growth are WNL. No peripheral edema or cyanosis. No masses, redness, swelling, asymmetry, or associated skin lesions. No contractures.  Skin & Extremity Inspection: Skin color, temperature, and hair growth are WNL. No peripheral edema or cyanosis. No masses, redness, swelling, asymmetry,  or associated skin lesions. No contractures.  Functional ROM: Decreased ROM for all joints of the lower extremity          Functional ROM: Decreased ROM for all joints of the lower extremity          Muscle Tone/Strength: Functionally intact. No obvious neuro-muscular anomalies detected.  Muscle Tone/Strength: Functionally intact. No obvious neuro-muscular anomalies detected.  Sensory (Neurological): Dermatomal pain pattern        Sensory (Neurological): Dermatomal pain pattern        DTR: Patellar: deferred today Achilles: deferred today Plantar: deferred today  DTR: Patellar: deferred today Achilles: deferred today Plantar: deferred today  Palpation: No palpable anomalies  Palpation: No palpable anomalies    Assessment   Status Diagnosis  Controlled Controlled Controlled 1. Spondylosis of cervical region without myelopathy or radiculopathy   2. History of lumbar fusion   3. Lumbar degenerative disc  disease   4. Lumbar spondylosis   5. Fibromyalgia   6. Major depressive disorder, remission status unspecified, unspecified whether recurrent   7. Chronic pain syndrome      General Recommendations: The pain condition that the patient suffers from is best treated with a multidisciplinary approach that involves an increase in physical activity to prevent de-conditioning and worsening of the pain cycle, as well as psychological counseling (formal and/or informal) to address the co-morbid psychological affects of pain. Treatment will often involve judicious use of pain medications and interventional procedures to decrease the pain, allowing the patient to participate in the physical activity that will ultimately produce long-lasting pain reductions. The goal of the multidisciplinary approach is to return the patient to a higher level of overall function and to restore their ability to perform activities of daily living.  63 year old female with chronic pain syndrome secondary to lumbar degenerative disc disease, lumbar radiculopathy, lumbar spondylosis status post L3-L5 lumbar fusion here today for medication refill.  Patient's previous urine drug screen was negative for levorphanol.  Patient states that she takes this medication daily as prescribed.  Will repeat today.  Previous dose was earlier this morning.  Expect this UDS to be positive for levorphanol.  Otherwise, no significant changes in medical history.  Can consider repeating diagnostic bilateral C4, C5, C6, C7 cervical facet medial branch nerve blocks in the future, previous ones done on 08/20/2017.  Patient instructed to continue her gabapentin.  Plan: -UDS- should be positive for levorphanol -Refill levorphanol at previous dose below -Continue gabapentin as prescribed,no refills needed -Can consider repeat cervical facet medial branch nerve block #2, bilateral at C4, 5, 6, 7 for cervical spondylosis and facet arthropathy if neck pain  worsensand if patient wants to repeat   Plan of Care  Pharmacotherapy (Medications Ordered): Meds ordered this encounter  Medications  . DISCONTD: levorphanol (LEVODROMORAN) 2 MG tablet    Sig: 3 mg in am, 2 mg in afternoon, and 35m evening For chronic pain To last 30 days from fill date Fill on or after: 07/30/18, 08/28/18, 09/27/18    Dispense:  105 tablet    Refill:  0  . DISCONTD: levorphanol (LEVODROMORAN) 2 MG tablet    Sig: 3 mg in am, 2 mg in afternoon, and 293mevening For chronic pain To last 30 days from fill date Fill on or after: 07/30/18, 08/28/18, 09/27/18    Dispense:  105 tablet    Refill:  0  . levorphanol (LEVODROMORAN) 2 MG tablet    Sig: 3 mg in am, 2 mg in afternoon, and  2m evening For chronic pain To last 30 days from fill date Fill on or after: 07/30/18, 08/28/18, 09/27/18    Dispense:  105 tablet    Refill:  0   Lab-work, procedure(s), and/or referral(s): Orders Placed This Encounter  Procedures  . ToxASSURE Select [facility] (MW), Urine    Provider-requested follow-up: Return in about 14 weeks (around 10/19/2018) for Medication Management.  Future Appointments  Date Time Provider [place] 07/13/2018  2:30 PM LGillis Santa MD [facility] None  07/19/2018  1:00 PM EUrsula Alert MD [facility] None  07/19/2018  2:00 PM PLubertha South LCSW [facility] None    Primary Care Physician: HTracie Harrier [facility] Note by: BGillis Santa M.D Date: 07/13/2018; Time: 2:29 PM  Patient Instructions  You have been given 3 Rx for Levorphanol to last until 10/27/2018.

## 2018-07-13 NOTE — Progress Notes (Signed)
Nursing Pain Medication Assessment:  Safety precautions to be maintained throughout the outpatient stay will include: orient to surroundings, keep bed in low position, maintain call bell within reach at all times, provide assistance with transfer out of bed and ambulation.  Medication Inspection Compliance: Pill count conducted under aseptic conditions, in front of the patient. Neither the pills nor the bottle was removed from the patient's sight at any time. Once count was completed pills were immediately returned to the patient in their original bottle.  Medication: Levorphanol Pill/Patch Count: 63 of 105 pills remain Pill/Patch Appearance: Markings consistent with prescribed medication Bottle Appearance: Standard pharmacy container. Clearly labeled. Filled Date: 3 / 4 / 2020 Last Medication intake:  Today

## 2018-07-16 ENCOUNTER — Other Ambulatory Visit: Payer: Self-pay | Admitting: Psychiatry

## 2018-07-18 LAB — TOXASSURE SELECT 13 (MW), URINE

## 2018-07-19 ENCOUNTER — Ambulatory Visit: Payer: 59 | Admitting: Psychiatry

## 2018-07-19 ENCOUNTER — Ambulatory Visit: Payer: 59 | Admitting: Licensed Clinical Social Worker

## 2018-07-20 NOTE — Progress Notes (Signed)
   THERAPIST PROGRESS NOTE  Session Time: 68min  Participation Level: Active  Behavioral Response: CasualAlertDepressed  Type of Therapy: Individual Therapy  Treatment Goals addressed: Coping and Diagnosis: Depression  Interventions: CBT and Motivational Interviewing  Summary: Connie [name] is a 63 y.o. female who presents with a reduction in symptoms. Therapist provided active listening as Patient shared details about herself, her current mood and stressors.  Therapist provided support and feedback for Patient as she expressed concern about her level of frustration with her husband and her feelings toward their relationship.  Therapist assisted Patient with deep breathing techniques to calm down.  Therapist assisted Patient and provided additional support and feedback to address Patient's concerns regarding her relationship status.  Explored ways to communicate needs to husband.   Suicidal/Homicidal: No  Plan: Return again in 2 weeks.  Diagnosis: Axis I: Depression    Axis II: No diagnosis    Lubertha South, LCSW 04/29/2018

## 2018-07-21 NOTE — Progress Notes (Signed)
   THERAPIST PROGRESS NOTE  Session Time: 71min  Participation Level: Active  Behavioral Response: CasualAlertDepressed  Type of Therapy: Individual Therapy  Treatment Goals addressed: Coping  Interventions: CBT and Motivational Interviewing  Summary: Connie [name] is a 63 y.o. female who presents with a reduction in symptoms.  Therapist discussed with Patient the progress that she has made on her goals.  Therapist discussed medication management with Patient.  Therapist discussed the importance of medication and finding mediation that is effective for her. Therapist encouraged Patient to discuss her personal beliefs about medication. Therapist asked a closed ended question about medication, Have you had an experience where stopping medication worsened your symptoms or caused a relapse.  Therapist encouraged Patient to explain her answer and to give examples.  Therapist assisted Patient with discussing side effects that she has experienced.  Therapist asked Patient what she has done in the past when she experienced negative side effects and what she will do if she experiences side effects now.   Suicidal/Homicidal: No  Plan: Return again in 2 weeks.  Diagnosis: Axis I: Depression    Axis II: No diagnosis    Lubertha South, LCSW 05/26/2018

## 2018-07-27 ENCOUNTER — Other Ambulatory Visit: Payer: Self-pay | Admitting: Psychiatry

## 2018-07-27 DIAGNOSIS — F331 Major depressive disorder, recurrent, moderate: Secondary | ICD-10-CM

## 2018-08-22 ENCOUNTER — Other Ambulatory Visit: Payer: Self-pay | Admitting: Psychiatry

## 2018-08-22 DIAGNOSIS — F321 Major depressive disorder, single episode, moderate: Secondary | ICD-10-CM

## 2018-08-23 MED ORDER — CITALOPRAM HYDROBROMIDE 10 MG PO TABS: 15.0000 mg | ORAL_TABLET | Freq: Every day | ORAL | 0 refills | Status: DC

## 2018-08-23 NOTE — Telephone Encounter (Signed)
Sent celexa to pharmacy

## 2018-08-24 ENCOUNTER — Telehealth: Payer: Self-pay

## 2018-08-24 NOTE — Telephone Encounter (Signed)
received fax request that a prior auth was needed for hydrobromide 10mg 

## 2018-08-24 NOTE — Telephone Encounter (Signed)
went online to [email or web address] and submitted the prior auth for medication. - pending review.

## 2018-08-24 NOTE — Telephone Encounter (Signed)
pharmacy was faxed and confirmed the approval notice for patient medication.

## 2018-08-24 NOTE — Telephone Encounter (Signed)
received  that prior Josem Kaufmann is approved from  08-24-18 to 08-24-19

## 2018-09-10 ENCOUNTER — Other Ambulatory Visit: Payer: Self-pay | Admitting: Student in an Organized Health Care Education/Training Program

## 2018-09-15 DIAGNOSIS — F331 Major depressive disorder, recurrent, moderate: Secondary | ICD-10-CM | POA: Insufficient documentation

## 2018-09-15 DIAGNOSIS — E538 Deficiency of other specified B group vitamins: Secondary | ICD-10-CM | POA: Insufficient documentation

## 2018-09-16 ENCOUNTER — Other Ambulatory Visit: Payer: Self-pay | Admitting: Internal Medicine

## 2018-09-16 DIAGNOSIS — Z1231 Encounter for screening mammogram for malignant neoplasm of breast: Secondary | ICD-10-CM

## 2018-09-20 ENCOUNTER — Encounter: Payer: Self-pay | Admitting: Student in an Organized Health Care Education/Training Program

## 2018-09-21 DIAGNOSIS — G894 Chronic pain syndrome: Secondary | ICD-10-CM | POA: Insufficient documentation

## 2018-09-21 NOTE — Telephone Encounter (Signed)
Spoke with patient about Gabapentin and her request for refill.  Last Rx was given 03/15/18.  I asked if she had been out for a while now and she states that her husband has the same strength and she had been taking his but was now needing to get an Rx.  She was last seen in March and does have an appt for 10/19/18.  I told her that I would send Dr Holley Raring a message to see if he would fill for her.

## 2018-09-22 MED ORDER — GABAPENTIN 300 MG PO CAPS: 300.0000 mg | ORAL_CAPSULE | Freq: Two times a day (BID) | ORAL | 5 refills | Status: DC

## 2018-09-22 NOTE — Telephone Encounter (Signed)
Gabapentin sent into pharmacy. Call patient to notify please.  Requested Prescriptions   Signed Prescriptions Disp Refills  . gabapentin (NEURONTIN) 300 MG capsule 60 capsule 5    Sig: Take 1 capsule (300 mg total) by mouth 2 (two) times daily.    Authorizing Provider: [name]

## 2018-10-18 ENCOUNTER — Encounter: Payer: Self-pay | Admitting: Student in an Organized Health Care Education/Training Program

## 2018-10-19 ENCOUNTER — Other Ambulatory Visit: Payer: Self-pay

## 2018-10-19 ENCOUNTER — Ambulatory Visit
Payer: 59 | Attending: Student in an Organized Health Care Education/Training Program | Admitting: Student in an Organized Health Care Education/Training Program

## 2018-10-19 ENCOUNTER — Encounter: Payer: Self-pay | Admitting: Student in an Organized Health Care Education/Training Program

## 2018-10-19 DIAGNOSIS — Z981 Arthrodesis status: Secondary | ICD-10-CM

## 2018-10-19 DIAGNOSIS — M542 Cervicalgia: Secondary | ICD-10-CM

## 2018-10-19 DIAGNOSIS — M5030 Other cervical disc degeneration, unspecified cervical region: Secondary | ICD-10-CM

## 2018-10-19 DIAGNOSIS — F329 Major depressive disorder, single episode, unspecified: Secondary | ICD-10-CM

## 2018-10-19 DIAGNOSIS — M47812 Spondylosis without myelopathy or radiculopathy, cervical region: Secondary | ICD-10-CM | POA: Diagnosis not present

## 2018-10-19 DIAGNOSIS — M797 Fibromyalgia: Secondary | ICD-10-CM

## 2018-10-19 DIAGNOSIS — M47816 Spondylosis without myelopathy or radiculopathy, lumbar region: Secondary | ICD-10-CM

## 2018-10-19 DIAGNOSIS — Z9689 Presence of other specified functional implants: Secondary | ICD-10-CM

## 2018-10-19 DIAGNOSIS — F32A Depression, unspecified: Secondary | ICD-10-CM

## 2018-10-19 DIAGNOSIS — G894 Chronic pain syndrome: Secondary | ICD-10-CM

## 2018-10-19 DIAGNOSIS — M5136 Other intervertebral disc degeneration, lumbar region: Secondary | ICD-10-CM | POA: Diagnosis not present

## 2018-10-19 MED ORDER — LEVORPHANOL TARTRATE 2 MG PO TABS: ORAL_TABLET | ORAL | 0 refills | Status: AC

## 2018-10-19 MED ORDER — LEVORPHANOL TARTRATE 2 MG PO TABS: ORAL_TABLET | ORAL | 0 refills | Status: DC

## 2018-10-19 NOTE — Progress Notes (Signed)
Pain Management Virtual Encounter Note - Virtual Visit via Telephone Telehealth (real-time audio visits between healthcare provider and patient).   Patient's Phone No. & Preferred Pharmacy:  [phone] (home); [phone] (mobile); (Preferred) [phone] [id number] [address], [place] AT [facility] OF [address] [place] [address] Phone: [phone] Fax: [phone]    Pre-screening note:  Our staff contacted Connie [name] and offered her an "in person", "face-to-face" appointment versus a telephone encounter. She indicated preferring the telephone encounter, at this time.   Reason for Virtual Visit: COVID-19*  Social distancing based on CDC and AMA recommendations.   I contacted Connie [name] on 10/19/2018 via telephone.      I clearly identified myself as [name], MD. I verified that I was speaking with the correct person using two identifiers (Name: Connie [name], and date of birth: Jul 26, 1955).  Advanced Informed Consent I sought verbal advanced consent from Connie [name] for virtual visit interactions. I informed Connie [name] of possible security and privacy concerns, risks, and limitations associated with providing "not-in-person" medical evaluation and management services. I also informed Connie [name] of the availability of "in-person" appointments. Finally, I informed her that there would be a charge for the virtual visit and that she could be  personally, fully or partially, financially responsible for it. Connie [name] expressed understanding and agreed to proceed.   Historic Elements   Connie [name] is a 63 y.o. year old, female patient evaluated today after her last encounter by our practice on 09/10/2018. Connie [name]  has a past medical history of Anemia, Anxiety, Arthritis, Cervical spondylosis with myelopathy (2018), Chronic pain (2019), DDD (degenerative disc disease), lumbar,  Depression, Displacement of cervical intervertebral disc (2018), Dyspnea, Fibromyalgia, GERD (gastroesophageal reflux disease), Heart murmur, Hypertension, Hypothyroidism, Lumbar post-laminectomy syndrome (2018), Lumbosacral radiculitis (2018), Neuropathy due to medical condition ([place]), Osteoarthritis, Osteoporosis, Other long term (current) drug therapy (2019), Pain in the coccyx (2018), Spondylolisthesis (2018), Spondylosis of lumbosacral region without myelopathy or radiculopathy (2018), Spondylosis with myelopathy, lumbar region (2018), and Vitamin D deficiency. She also  has a past surgical history that includes Cesarean section; Gastric bypass (2004); Lumbar fusion (2016); Spinal cord stimulator implant (2018); Cholecystectomy; Appendectomy; Tonsillectomy; Shoulder arthroscopy with rotator cuff repair (Right, 2008); Joint replacement (Bilateral, 2005); Abdominal hysterectomy (2000); epidural injection (2018); arm surgery (2002); Breast biopsy (Left, 2005); Colonoscopy with propofol (N/A, 08/25/2017); Temporal artery biopsy / ligation; and Cataract extraction w/PHACO (Right, 10/21/2017). Connie [name] has a current medication list which includes the following prescription(s): bupropion, celecoxib, citalopram, furosemide, gabapentin, glycopyrrolate, hydroxyzine, ibuprofen, levorphanol, levorphanol, levorphanol, levothyroxine, loratadine, metoprolol succinate, ondansetron, pantoprazole, trazodone, glycopyrrolate, metoprolol succinate, and prednisone. She  reports that she quit smoking about 11 years ago. Her smoking use included cigarettes. She has a 10.00 pack-year smoking history. She has never used smokeless tobacco. She reports that she does not drink alcohol or use drugs. Connie [name] is allergic to tape.   HPI  Today, she is being contacted for medication management.   Patient did see rheumatology.  Prescribed Medrol Dosepak.  Instructed to follow-up in 6 months.    No change in medical history since last  visit.  Patient's pain is at baseline.  Patient continues multimodal pain regimen as prescribed.  States that it provides pain relief and improvement in functional status.   Pharmacotherapy Assessment   09/27/2018  1   07/13/2018  Levorphanol 2 MG Tablet  105.00 30 Bi Lat   [id number]   Wal ([id number])   0  77.00 MME  Comm Ins   [place]     Monitoring: Pharmacotherapy: No side-effects or adverse reactions reported. [place] PMP: PDMP reviewed during this encounter.       Compliance: No problems identified. Effectiveness: Clinically acceptable. Plan: Refer to "POC".  Pertinent Labs   SAFETY SCREENING Profile Lab Results  Component Value Date   STAPHAUREUS NEGATIVE 06/02/2017   MRSAPCR NEGATIVE 06/02/2017   Renal Function Lab Results  Component Value Date   BUN 18 06/23/2017   CREATININE 0.60 06/23/2017   GFRAA >60 06/23/2017   GFRNONAA >60 06/23/2017   Hepatic Function Lab Results  Component Value Date   AST 20 06/23/2017   ALT 9 (L) 06/23/2017   ALBUMIN 3.6 06/23/2017   UDS Summary  Date Value Ref Range Status  07/13/2018 FINAL  Final    Comment:    ==================================================================== TOXASSURE SELECT 13 (MW) ==================================================================== Test                             Result       Flag       Units   NO DRUGS DETECTED. ==================================================================== Test                      Result    Flag   Units      Ref Range   Creatinine              101              mg/dL      >=20 ==================================================================== Declared Medications:  The flagging and interpretation on this report are based on the  following declared medications.  Unexpected results may arise from  inaccuracies in the declared medications.  **Note: The testing scope of this panel does not include following  reported medications:  Bupropion  Celecoxib  Citalopram   Furosemide  Gabapentin  Glycopyrrolate  Hydroxyzine  Ibuprofen  Levorphanol  Levothyroxine  Loratadine  Metoprolol  Ondansetron  Pantoprazole  Prednisone  Trazodone ==================================================================== For clinical consultation, please call [phone]. ====================================================================    Note: Above Lab results reviewed.  Recent imaging  DG C-Arm 1-60 Min-No Report Fluoroscopy was utilized by the requesting physician.  No radiographic  interpretation.   Assessment  The primary encounter diagnosis was Spondylosis of cervical region without myelopathy or radiculopathy. Diagnoses of History of lumbar fusion, Lumbar degenerative disc disease, Lumbar spondylosis, Fibromyalgia, Major depressive disorder, remission status unspecified, unspecified whether recurrent, Chronic pain syndrome, Cervical facet joint syndrome (L>R, C4/5/6/7), DDD (degenerative disc disease), cervical, Spinal cord stimulator status, Depression, unspecified depression type, and Cervicalgia were also pertinent to this visit.  Plan of Care  I have changed Connie Joy. Moe "[name]"'s levorphanol. I am also having her start on levorphanol and levorphanol. Additionally, I am having her maintain her glycopyrrolate, levothyroxine, loratadine, metoprolol succinate, pantoprazole, ondansetron, ibuprofen, predniSONE, celecoxib, furosemide, traZODone, glycopyrrolate, metoprolol succinate, hydrOXYzine, buPROPion, citalopram, and gabapentin.  Pharmacotherapy (Medications Ordered): Meds ordered this encounter  Medications  . levorphanol (LEVODROMORAN) 2 MG tablet    Sig: 3 mg in am, 2 mg in afternoon, and 2mg  evening For chronic pain To last 30 days from fill date    Dispense:  105 tablet    Refill:  0  . levorphanol (LEVODROMORAN) 2 MG tablet    Sig: 3 mg in am, 2 mg in  afternoon, and 2mg  evening For chronic pain To last 30 days from fill date    Dispense:   105 tablet    Refill:  0  . levorphanol (LEVODROMORAN) 2 MG tablet    Sig: 3 mg in am, 2 mg in afternoon, and 2mg  evening For chronic pain To last 30 days from fill date    Dispense:  105 tablet    Refill:  0   Orders:  No orders of the defined types were placed in this encounter.  Follow-up plan:   Return in about 3 months (around 01/19/2019) for Medication Management.    Recent Visits No visits were found meeting these conditions.  Showing recent visits within past 90 days and meeting all other requirements   Today's Visits Date Type Provider Dept  10/19/18 Office Visit [name], MD [facility]  Showing today's visits and meeting all other requirements   Future Appointments No visits were found meeting these conditions.  Showing future appointments within next 90 days and meeting all other requirements   I discussed the assessment and treatment plan with the patient. The patient was provided an opportunity to ask questions and all were answered. The patient agreed with the plan and demonstrated an understanding of the instructions.  Patient advised to call back or seek an in-person evaluation if the symptoms or condition worsens.  Total duration of non-face-to-face encounter: 25 minutes.  Note by: [name], MD Date: 10/19/2018; Time: 2:15 PM  Note: This dictation was prepared with Dragon dictation. Any transcriptional errors that may result from this process are unintentional.  Disclaimer:  * Given the special circumstances of the COVID-19 pandemic, the federal government has announced that the Office for Civil Rights (OCR) will exercise its enforcement discretion and will not impose penalties on physicians using telehealth in the event of noncompliance with regulatory requirements under the [place] (HIPAA) in connection with the good faith provision of telehealth during the OMAYO-45 national public health emergency.  ([place])

## 2018-10-27 ENCOUNTER — Telehealth: Payer: Self-pay

## 2018-10-27 NOTE — Telephone Encounter (Signed)
Pt went to pick up rx and they told her she doesn't have any, She had virtual visit with dr Holley Raring and he was sending in rx to fill on 07/01 her next refill appt is in sept. Please let her know when rx is sent.

## 2018-10-27 NOTE — Telephone Encounter (Signed)
Spoke with pharmacy and they do in fact have 3 scripts for Levorphanol on file.  LM with patient.

## 2018-11-21 ENCOUNTER — Other Ambulatory Visit: Payer: Self-pay | Admitting: Psychiatry

## 2018-11-21 DIAGNOSIS — F321 Major depressive disorder, single episode, moderate: Secondary | ICD-10-CM

## 2018-11-23 NOTE — Telephone Encounter (Signed)
Sent celexa,pt needs appt.

## 2018-12-02 ENCOUNTER — Other Ambulatory Visit: Payer: Self-pay | Admitting: Surgery

## 2018-12-02 DIAGNOSIS — M75121 Complete rotator cuff tear or rupture of right shoulder, not specified as traumatic: Secondary | ICD-10-CM

## 2018-12-02 DIAGNOSIS — M7581 Other shoulder lesions, right shoulder: Secondary | ICD-10-CM

## 2018-12-12 ENCOUNTER — Other Ambulatory Visit: Payer: Self-pay | Admitting: Psychiatry

## 2018-12-12 DIAGNOSIS — F331 Major depressive disorder, recurrent, moderate: Secondary | ICD-10-CM

## 2018-12-13 ENCOUNTER — Ambulatory Visit
Admission: RE | Admit: 2018-12-13 | Discharge: 2018-12-13 | Disposition: A | Discharge status: Home / Self Care | Payer: 59 | Source: Ambulatory Visit | Attending: Surgery | Admitting: Surgery

## 2018-12-13 ENCOUNTER — Other Ambulatory Visit: Payer: Self-pay

## 2018-12-13 DIAGNOSIS — M7581 Other shoulder lesions, right shoulder: Secondary | ICD-10-CM | POA: Diagnosis present

## 2018-12-13 DIAGNOSIS — M75121 Complete rotator cuff tear or rupture of right shoulder, not specified as traumatic: Secondary | ICD-10-CM | POA: Insufficient documentation

## 2019-01-10 ENCOUNTER — Telehealth: Payer: Self-pay | Admitting: Student in an Organized Health Care Education/Training Program

## 2019-01-10 NOTE — Telephone Encounter (Signed)
Patient lvmail stating she moved on Friday and thinks the movers stole her medications. Please call patient. She wanted to know if meds could be called to last until her appt.

## 2019-01-11 ENCOUNTER — Encounter: Payer: Self-pay | Admitting: Psychiatry

## 2019-01-11 ENCOUNTER — Ambulatory Visit (INDEPENDENT_AMBULATORY_CARE_PROVIDER_SITE_OTHER): Payer: 59 | Admitting: Psychiatry

## 2019-01-11 ENCOUNTER — Other Ambulatory Visit: Payer: Self-pay

## 2019-01-11 ENCOUNTER — Telehealth: Payer: Self-pay

## 2019-01-11 DIAGNOSIS — F411 Generalized anxiety disorder: Secondary | ICD-10-CM | POA: Diagnosis not present

## 2019-01-11 DIAGNOSIS — F331 Major depressive disorder, recurrent, moderate: Secondary | ICD-10-CM

## 2019-01-11 DIAGNOSIS — F1193 Opioid use, unspecified with withdrawal: Secondary | ICD-10-CM

## 2019-01-11 DIAGNOSIS — R413 Other amnesia: Secondary | ICD-10-CM

## 2019-01-11 DIAGNOSIS — F5105 Insomnia due to other mental disorder: Secondary | ICD-10-CM | POA: Diagnosis not present

## 2019-01-11 DIAGNOSIS — Z634 Disappearance and death of family member: Secondary | ICD-10-CM

## 2019-01-11 MED ORDER — CITALOPRAM HYDROBROMIDE 20 MG PO TABS: 20.0000 mg | ORAL_TABLET | Freq: Every day | ORAL | 0 refills | Status: DC

## 2019-01-11 MED ORDER — TRAZODONE HCL 100 MG PO TABS: ORAL_TABLET | ORAL | 0 refills | Status: DC

## 2019-01-11 MED ORDER — HYDROXYZINE PAMOATE 25 MG PO CAPS: 25.0000 mg | ORAL_CAPSULE | Freq: Three times a day (TID) | ORAL | 2 refills | Status: DC | PRN

## 2019-01-11 MED ORDER — BUPROPION HCL ER (XL) 300 MG PO TB24: ORAL_TABLET | ORAL | 1 refills | Status: DC

## 2019-01-11 NOTE — Progress Notes (Signed)
Virtual Visit via Telephone Note  I connected with Vanna Petzoldt Reinhardt on 01/11/19 at  2:00 PM EDT by telephone and verified that I am speaking with the correct person using two identifiers.   I discussed the limitations, risks, security and privacy concerns of performing an evaluation and management service by telephone and the availability of in person appointments. I also discussed with the patient that there may be a patient responsible charge related to this service. The patient expressed understanding and agreed to proceed.     I discussed the assessment and treatment plan with the patient. The patient was provided an opportunity to ask questions and all were answered. The patient agreed with the plan and demonstrated an understanding of the instructions.   The patient was advised to call back or seek an in-person evaluation if the symptoms worsen or if the condition fails to improve as anticipated. [place] MD OP Progress Note  01/11/2019 3:38 PM Maya Caler Travaglini  MRN:  [id number]  Chief Complaint:  Chief Complaint    Follow-up     HPI: [name] is a 63 year old Caucasian female, married, on SSD, lives in [place], has a history of depression, history of delusional parasitosis, chronic pain on opioid medications, fibromyalgia, hypothyroidism, hypertension, polyarthralgia was evaluated by telemedicine today.  Patient preferred to do a phone call.  Patient today reports she moved to a new house on Friday.  She reports during the move she may have lost her opioid medication.  She has not been able to take her medications since the past few days.  She currently reports withdrawal symptoms like diarrhea, inability to eat her meals, inability to sleep and increased agitation.  She reports she has called her pain provider however has been unable to give more medications since she is not due for it.  Discussed with patient that she may need to go to the nearest emergency department if her withdrawal  symptoms are getting worse.  Discussed with patient to increase her trazodone to 200 mg if she is having a lot of sleep problems.  We will also readjust her Celexa today.  Patient denies any suicidality, homicidality or perceptual disturbances.  Patient denies any other concerns today. Visit Diagnosis:    ICD-10-CM   1. MDD (major depressive disorder), recurrent episode, moderate (HCC)  F33.1 traZODone (DESYREL) 100 MG tablet    hydrOXYzine (VISTARIL) 25 MG capsule    citalopram (CELEXA) 20 MG tablet    buPROPion (WELLBUTRIN XL) 300 MG 24 hr tablet  2. GAD (generalized anxiety disorder)  F41.1   3. Insomnia due to mental disorder  F51.05 traZODone (DESYREL) 100 MG tablet    hydrOXYzine (VISTARIL) 25 MG capsule    citalopram (CELEXA) 20 MG tablet  4. Memory loss  R41.3   5. Bereavement  Z63.4   6. Opioid use with withdrawal ([place])  F11.93    on presribed pain medications    Past Psychiatric History: I have reviewed past psychiatric history from my progress note on 08/06/2017.  Past trials of Latuda, Wellbutrin, Cymbalta  Past Medical History:  Past Medical History:  Diagnosis Date  . Anemia    in the past  . Anxiety   . Arthritis    all over  . Cervical spondylosis with myelopathy 2018  . Chronic pain 2019   can't stand straight, uses walker for stability  . DDD (degenerative disc disease), lumbar   . Depression   . Displacement of cervical intervertebral disc 2018  . Dyspnea  HANDICAPPED  . Fibromyalgia    Polymyalgia Rheumatica  . GERD (gastroesophageal reflux disease)   . Heart murmur    not treated or being followed  . Hypertension    CONTROLLED  . Hypothyroidism   . Lumbar post-laminectomy syndrome 2018  . Lumbosacral radiculitis 2018  . Neuropathy due to medical condition (HCC)    feet, toes, fingers  . Osteoarthritis   . Osteoporosis   . Other long term (current) drug therapy 2019   from pain management  . Pain in the coccyx 2018   disorder of sacrum   . Spondylolisthesis 2018  . Spondylosis of lumbosacral region without myelopathy or radiculopathy 2018  . Spondylosis with myelopathy, lumbar region 2018  . Vitamin D deficiency     Past Surgical History:  Procedure Laterality Date  . ABDOMINAL HYSTERECTOMY  2000  . APPENDECTOMY    . arm surgery  2002  . BREAST BIOPSY Left 2005   benign  . CATARACT EXTRACTION W/PHACO Right 10/21/2017   Procedure: CATARACT EXTRACTION PHACO AND INTRAOCULAR LENS PLACEMENT ([facility]) RIGHT;  Surgeon: Leandrew Koyanagi, MD;  Location: [facility];  Service: Ophthalmology;  Laterality: Right;  neck issues  . CESAREAN SECTION     x 2  . CHOLECYSTECTOMY    . COLONOSCOPY WITH PROPOFOL N/A 08/25/2017   Procedure: COLONOSCOPY WITH PROPOFOL;  Surgeon: Toledo, Benay Pike, MD;  Location: [facility];  Service: Gastroenterology;  Laterality: N/A;  . epidural injection  2018   steroids  . GASTRIC BYPASS  2004  . JOINT REPLACEMENT Bilateral 2005   knees  . LUMBAR FUSION  2016   x 2/ L3-5  . SHOULDER ARTHROSCOPY WITH ROTATOR CUFF REPAIR Right 2008   x 3  . SPINAL CORD STIMULATOR IMPLANT  2018   [facility] scientific  . TEMPORAL ARTERY BIOPSY / LIGATION    . TONSILLECTOMY      Family Psychiatric History: I have reviewed family psychiatric history from my progress note on 08/06/2017  Family History:  Family History  Problem Relation Age of Onset  . Kidney disease Mother   . Cancer Mother   . Anxiety disorder Mother   . Depression Mother   . Breast cancer Mother   . Cancer Father   . Hypertension Father   . Cancer Brother   . Breast cancer Maternal Aunt   . Breast cancer Cousin     Social History: I have reviewed social history from my progress note on 08/06/2017 Social History   Socioeconomic History  . Marital status: Married    Spouse name: brady   . Number of children: 2  . Years of education: Not on file  . Highest education level: High school graduate  Occupational History    Comment:  disabled   Social Needs  . Financial resource strain: Somewhat hard  . Food insecurity    Worry: Never true    Inability: Never true  . Transportation needs    Medical: No    Non-medical: No  Tobacco Use  . Smoking status: Former Smoker    Packs/day: 0.50    Years: 20.00    Pack years: 10.00    Types: Cigarettes    Quit date: 05/21/2007    Years since quitting: 11.6  . Smokeless tobacco: Never Used  Substance and Sexual Activity  . Alcohol use: No    Frequency: Never  . Drug use: No    Comment: managed by pain clinic  . Sexual activity: Not Currently  Lifestyle  .  Physical activity    Days per week: 0 days    Minutes per session: 0 min  . Stress: Not on file  Relationships  . Social Herbalist on phone: Three times a week    Gets together: Once a week    Attends religious service: More than 4 times per year    Active member of club or organization: No    Attends meetings of clubs or organizations: Never    Relationship status: Married  Other Topics Concern  . Not on file  Social History Narrative  . Not on file    Allergies:  Allergies  Allergen Reactions  . Tape Rash    Skin peels off and blisters.  Probably was clear plastic tape.    Metabolic Disorder Labs: No results found for: HGBA1C, MPG No results found for: PROLACTIN No results found for: CHOL, TRIG, HDL, CHOLHDL, VLDL, LDLCALC No results found for: TSH  Therapeutic Level Labs: No results found for: LITHIUM No results found for: VALPROATE No components found for:  CBMZ  Current Medications: Current Outpatient Medications  Medication Sig Dispense Refill  . azelastine (ASTELIN) 0.1 % nasal spray Place into the nose.    Marland Kitchen azelastine (ASTELIN) 0.1 % nasal spray U 1 SPR IEN BID    . buPROPion (WELLBUTRIN XL) 300 MG 24 hr tablet TAKE 1 TABLET(300 MG) BY MOUTH DAILY 90 tablet 1  . cefdinir (OMNICEF) 300 MG capsule     . celecoxib (CELEBREX) 200 MG capsule Take 200 mg by mouth 2 (two) times  daily. AM    . citalopram (CELEXA) 20 MG tablet Take 1 tablet (20 mg total) by mouth daily. 90 tablet 0  . furosemide (LASIX) 20 MG tablet Take 20 mg by mouth as needed.    . gabapentin (NEURONTIN) 300 MG capsule Take 1 capsule (300 mg total) by mouth 2 (two) times daily. 60 capsule 5  . glycopyrrolate (ROBINUL) 1 MG tablet Take 1 mg by mouth 2 (two) times daily. AM AND HS  3  . glycopyrrolate (ROBINUL) 1 MG tablet TAKE 1 TABLET(1 MG) BY MOUTH TWICE DAILY    . hydrOXYzine (VISTARIL) 25 MG capsule Take 1 capsule (25 mg total) by mouth 3 (three) times daily as needed. FOR severe anxiety as well as for sleep 90 capsule 2  . ibuprofen (ADVIL,MOTRIN) 200 MG tablet Take 400 mg by mouth daily as needed for headache or moderate pain.    Marland Kitchen levorphanol (LEVODROMORAN) 2 MG tablet 3 mg in am, 2 mg in afternoon, and 2mg  evening For chronic pain To last 30 days from fill date 105 tablet 0  . levothyroxine (SYNTHROID, LEVOTHROID) 25 MCG tablet 50 mcg.   0  . loratadine (CLARITIN) 10 MG tablet Take 10 mg by mouth daily as needed for allergies.    . metoprolol succinate (TOPROL-XL) 100 MG 24 hr tablet Take 100 mg by mouth once daily in the evening  0  . metoprolol succinate (TOPROL-XL) 100 MG 24 hr tablet Take by mouth.    . ondansetron (ZOFRAN-ODT) 8 MG disintegrating tablet Take 8 mg by mouth daily as needed for nausea or vomiting.    . pantoprazole (PROTONIX) 40 MG tablet Take 40 mg by mouth once daily in the evening    . predniSONE (DELTASONE) 5 MG tablet Take 4 mg by mouth daily.    . traZODone (DESYREL) 100 MG tablet TAKE 1 TO 2 TABLETS(100 TO 200 MG) BY MOUTH AT BEDTIME AS NEEDED FOR  SLEEP 180 tablet 0  . vitamin B-12 (CYANOCOBALAMIN) 1000 MCG tablet Take by mouth.     No current facility-administered medications for this visit.      Musculoskeletal: Strength & Muscle Tone: UTA Gait & Station: Walks with walker Patient leans: N/A  Psychiatric Specialty Exam: Review of Systems  Gastrointestinal:  Positive for diarrhea.  Psychiatric/Behavioral: The patient is nervous/anxious and has insomnia.   All other systems reviewed and are negative.   There were no vitals taken for this visit.There is no height or weight on file to calculate BMI.  General Appearance: UTA  Eye Contact:  UTA  Speech:  Clear and Coherent  Volume:  Normal  Mood:  Anxious  Affect:  UTA  Thought Process:  Goal Directed and Descriptions of Associations: Intact  Orientation:  Full (Time, Place, and Person)  Thought Content: Logical   Suicidal Thoughts:  No  Homicidal Thoughts:  No  Memory:  Immediate;   Fair Recent;   Fair Remote;   Fair  Judgement:  Fair  Insight:  Fair  Psychomotor Activity:  UTA  Concentration:  Concentration: Fair and Attention Span: Fair  Recall:  [employer] of Knowledge: Fair  Language: Fair  Akathisia:  No  Handed:  Right  AIMS (if indicated):UTA  Assets:  Communication Skills Desire for Improvement Social Support  ADL's:  Intact  Cognition: WNL  Sleep:  Poor   Screenings: PHQ2-9     Office Visit from 07/13/2018 in [facility] Clinical Support from 04/15/2018 in [facility] Clinical Support from 01/27/2018 in [facility] Clinical Support from 11/05/2017 in [facility] Procedure visit from 08/19/2017 in [facility]  PHQ-2 Total Score  0  0  0  0  0       Assessment and Plan: [name] is a 63 year old Caucasian female who has a history of depression, chronic pain, GERD, hypothyroidism, hypertension, fibromyalgia was evaluated by phone today.  Patient is currently struggling with possible opioid withdrawal symptoms.  She also struggles with mood symptoms and will benefit from medication readjustment.  Plan MDD- unstable Continue Wellbutrin XL 300 mg p.o. daily Increase  Celexa to 20 mg p.o. daily Continue CBT with her therapist  Anxiety disorder-unstable Celexa as prescribed Increase hydroxyzine to 25 mg 3 times a day as needed  Insomnia-unstable Trazodone increased to 200 mg p.o. nightly.  She had been taking only 100 mg at bedtime. Hydroxyzine increased to 25 mg p.o. p.o. 3 times daily as needed  For opioid dependence currently in withdrawal- patient was on opiate medication prescribed by pain management.  She however reports she lost her prescription and likely is currently going through withdrawal symptoms.  Discussed with patient to go to the nearest emergency department if withdrawal symptoms get worse.  For bereavement-improving Continue psychotherapy sessions   For memory problems- stable We will monitor closely. TSH-within normal limits. Patient was on vitamin B12 replacement.  Follow-up in clinic in 4 weeks or sooner if needed.  October 15 at 10:30 AM  I have spent atleast 15 minutes non  face to face with patient today. More than 50 % of the time was spent for psychoeducation and supportive psychotherapy and care coordination. This note was generated in part or whole with voice recognition software. Voice recognition is usually quite accurate but there are transcription errors that can and very often do  occur. I apologize for any typographical errors that were not detected and corrected.       Ursula Alert, MD 01/11/2019, 3:38 PM

## 2019-01-11 NOTE — Telephone Encounter (Signed)
  pt states that she spoke with you today states that she needs a refill on her bupropion.   buPROPion (WELLBUTRIN XL) 300 MG 24 hr tablet Medication Date: 07/27/2018 Department: [facility] Ordering/Authorizing: Ursula Alert, MD  Order Providers  Prescribing Provider Encounter Provider  Ursula Alert, MD Ursula Alert, MD  Outpatient Medication Detail   Disp Refills Start End   buPROPion (WELLBUTRIN XL) 300 MG 24 hr tablet 90 tablet 1 07/27/2018    Sig: TAKE 1 TABLET(300 MG) BY MOUTH DAILY   Sent to pharmacy as: buPROPion (WELLBUTRIN XL) 300 MG 24 hr tablet   E-Prescribing Status: Receipt confirmed by pharmacy (07/27/2018 5:03 PM EDT)   Associated Diagnoses  MDD (major depressive disorder), recurrent episode, moderate [facility])     Pharmacy  [id number] - [place], [address] AT [address]

## 2019-01-11 NOTE — Telephone Encounter (Signed)
Sent wellbutrin

## 2019-01-12 ENCOUNTER — Emergency Department: Payer: 59

## 2019-01-12 ENCOUNTER — Other Ambulatory Visit: Payer: Self-pay

## 2019-01-12 ENCOUNTER — Other Ambulatory Visit: Payer: Self-pay | Admitting: Student in an Organized Health Care Education/Training Program

## 2019-01-12 ENCOUNTER — Emergency Department
Admission: EM | Admit: 2019-01-12 | Discharge: 2019-01-12 | Disposition: A | Discharge status: Home / Self Care | Payer: 59 | Attending: Emergency Medicine | Admitting: Emergency Medicine

## 2019-01-12 DIAGNOSIS — Z87891 Personal history of nicotine dependence: Secondary | ICD-10-CM | POA: Insufficient documentation

## 2019-01-12 DIAGNOSIS — I10 Essential (primary) hypertension: Secondary | ICD-10-CM | POA: Insufficient documentation

## 2019-01-12 DIAGNOSIS — Z79899 Other long term (current) drug therapy: Secondary | ICD-10-CM | POA: Insufficient documentation

## 2019-01-12 DIAGNOSIS — E039 Hypothyroidism, unspecified: Secondary | ICD-10-CM | POA: Diagnosis not present

## 2019-01-12 DIAGNOSIS — F1193 Opioid use, unspecified with withdrawal: Secondary | ICD-10-CM

## 2019-01-12 DIAGNOSIS — Z96653 Presence of artificial knee joint, bilateral: Secondary | ICD-10-CM | POA: Diagnosis not present

## 2019-01-12 DIAGNOSIS — F1123 Opioid dependence with withdrawal: Secondary | ICD-10-CM | POA: Insufficient documentation

## 2019-01-12 MED ORDER — MORPHINE SULFATE 15 MG PO TABS
15.0000 mg | ORAL_TABLET | Freq: Once | ORAL | Status: AC
  Administered 2019-01-12: 15 mg via ORAL
  Filled 2019-01-12: qty 1

## 2019-01-12 MED ORDER — LEVORPHANOL TARTRATE 2 MG PO TABS: ORAL_TABLET | ORAL | 0 refills | Status: DC

## 2019-01-12 MED ORDER — ONDANSETRON 4 MG PO TBDP
4.0000 mg | ORAL_TABLET | Freq: Once | ORAL | Status: AC
  Administered 2019-01-12: 10:00:00 4 mg via ORAL
  Filled 2019-01-12: qty 1

## 2019-01-12 NOTE — ED Notes (Signed)
NAD noted at time of D/C. Pt denies comments/concners regarding D/C paperwork.

## 2019-01-12 NOTE — ED Notes (Signed)
Pt states that she is on chronic opioids for back pain and has not had any medication since she moved on Friday. Pt reports that she cannot sleep due to the "withdrawal" and pain. Pt is AOx4, vss, she c/o chronic pain and does not show any signs of respiratory distress.

## 2019-01-12 NOTE — ED Notes (Signed)
Pt states she hasn't been able to take her levorphanol since Saturday. Pt reports nasal congestion, diarrhea, lack of appetite, yawning more than usual. Pt states she hasn't been able to sleep since Saturday. Pt unable to sit still in the bed at this time.  Pt reports generalized body aches including a headache that "feels like my head is going to explode". Pt also reports muscle tremors in arms and legs.
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
## 2019-01-12 NOTE — Discharge Instructions (Addendum)
Follow-up with Dr. Holley Raring next week as scheduled.  He will call in 1 week of additional medication today.
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
## 2019-01-17 ENCOUNTER — Encounter: Payer: Self-pay | Admitting: Student in an Organized Health Care Education/Training Program

## 2019-01-18 ENCOUNTER — Other Ambulatory Visit: Payer: Self-pay

## 2019-01-18 ENCOUNTER — Encounter: Payer: Self-pay | Admitting: Student in an Organized Health Care Education/Training Program

## 2019-01-18 ENCOUNTER — Ambulatory Visit
Payer: 59 | Attending: Student in an Organized Health Care Education/Training Program | Admitting: Student in an Organized Health Care Education/Training Program

## 2019-01-18 DIAGNOSIS — Z981 Arthrodesis status: Secondary | ICD-10-CM | POA: Diagnosis not present

## 2019-01-18 DIAGNOSIS — M47812 Spondylosis without myelopathy or radiculopathy, cervical region: Secondary | ICD-10-CM

## 2019-01-18 DIAGNOSIS — Z9689 Presence of other specified functional implants: Secondary | ICD-10-CM

## 2019-01-18 DIAGNOSIS — G894 Chronic pain syndrome: Secondary | ICD-10-CM

## 2019-01-18 DIAGNOSIS — M47816 Spondylosis without myelopathy or radiculopathy, lumbar region: Secondary | ICD-10-CM | POA: Diagnosis not present

## 2019-01-18 DIAGNOSIS — F329 Major depressive disorder, single episode, unspecified: Secondary | ICD-10-CM

## 2019-01-18 DIAGNOSIS — M5030 Other cervical disc degeneration, unspecified cervical region: Secondary | ICD-10-CM

## 2019-01-18 DIAGNOSIS — M797 Fibromyalgia: Secondary | ICD-10-CM

## 2019-01-18 DIAGNOSIS — M5136 Other intervertebral disc degeneration, lumbar region: Secondary | ICD-10-CM

## 2019-01-18 MED ORDER — LEVORPHANOL TARTRATE 2 MG PO TABS: ORAL_TABLET | ORAL | 0 refills | Status: AC

## 2019-01-18 MED ORDER — LEVORPHANOL TARTRATE 2 MG PO TABS: ORAL_TABLET | ORAL | 0 refills | Status: DC

## 2019-01-18 NOTE — Progress Notes (Signed)
Pain Management Virtual Encounter Note - Virtual Visit via [place] (real-time audio visits between healthcare provider and patient).   Patient's Phone No. & Preferred Pharmacy:  [phone] (home); [phone] (mobile); (Preferred) [phone] [email or web address] [id number], [place] AT [facility] OF [address] [place] [address] Phone: [phone] Fax: [phone]    Pre-screening note:  Our staff contacted [name] and offered her an "in person", "face-to-face" appointment versus a telephone encounter. She indicated preferring the telephone encounter, at this time.   Reason for Virtual Visit: COVID-19*  Social distancing based on CDC and AMA recommendations.   I contacted Darcell Swasey Stricker on 01/18/2019 via video conference.      I clearly identified myself as [name], MD. I verified that I was speaking with the correct person using two identifiers (Name: Connie [name], and date of birth: 04-Jul-1955).  Advanced Informed Consent I sought verbal advanced consent from [place] for virtual visit interactions. I informed Connie [name] of possible security and privacy concerns, risks, and limitations associated with providing "not-in-person" medical evaluation and management services. I also informed Connie [name] of the availability of "in-person" appointments. Finally, I informed her that there would be a charge for the virtual visit and that she could be  personally, fully or partially, financially responsible for it. Ms. Gustave expressed understanding and agreed to proceed.   Historic Elements   [name] is a 63 y.o. year old, female patient evaluated today after her last encounter by our practice on 01/10/2019. Ms. Cattaneo  has a past medical history of Anemia, Anxiety, Arthritis, Cervical spondylosis with myelopathy (2018), Chronic pain (2019), DDD (degenerative disc  disease), lumbar, Depression, Displacement of cervical intervertebral disc (2018), Dyspnea, Fibromyalgia, GERD (gastroesophageal reflux disease), Heart murmur, Hypertension, Hypothyroidism, Lumbar post-laminectomy syndrome (2018), Lumbosacral radiculitis (2018), Neuropathy due to medical condition ([place]), Osteoarthritis, Osteoporosis, Other long term (current) drug therapy (2019), Pain in the coccyx (2018), Spondylolisthesis (2018), Spondylosis of lumbosacral region without myelopathy or radiculopathy (2018), Spondylosis with myelopathy, lumbar region (2018), and Vitamin D deficiency. She also  has a past surgical history that includes Cesarean section; Gastric bypass (2004); Lumbar fusion (2016); Spinal cord stimulator implant (2018); Cholecystectomy; Appendectomy; Tonsillectomy; Shoulder arthroscopy with rotator cuff repair (Right, 2008); Joint replacement (Bilateral, 2005); Abdominal hysterectomy (2000); epidural injection (2018); arm surgery (2002); Breast biopsy (Left, 2005); Colonoscopy with propofol (N/A, 08/25/2017); Temporal artery biopsy / ligation; and Cataract extraction w/PHACO (Right, 10/21/2017). Connie [name] has a current medication list which includes the following prescription(s): azelastine, bupropion, cefdinir, celecoxib, citalopram, gabapentin, glycopyrrolate, hydroxyzine, ibuprofen, levorphanol, levorphanol, levorphanol, levothyroxine, loratadine, metoprolol succinate, ondansetron, pantoprazole, trazodone, azelastine, furosemide, glycopyrrolate, metoprolol succinate, prednisone, and vitamin b-12. She  reports that she quit smoking about 11 years ago. Her smoking use included cigarettes. She has a 10.00 pack-year smoking history. She has never used smokeless tobacco. She reports that she does not drink alcohol or use drugs. Ms. Schmith is allergic to tape.   HPI  Today, she is being contacted for medication management.   Patient presented to the emergency department last week with opioid  withdrawal symptoms.  She states that she was in the process of moving when she believes her movers stole her levorphanol.  She has made a claim and has notified the moving company.  In the ED, she received a dose of morphine and the ED physician contacted me to provide an  update as to what was going on.  At that time I wrote for a one-week prescription for levorphanol.  Patient was very Patent attorney.  Patient has been very compliant with therapy in the past.  PMP appropriate.  We will refill her levorphanol as below.  Pharmacotherapy Assessment  Analgesic:  01/12/2019  2   01/12/2019  Levorphanol 2 MG Tablet  25.00  7 Bi Lat   [id number]   Wal ([id number])   0  78.57 MME  Comm Ins   [place]    Monitoring: Pharmacotherapy: No side-effects or adverse reactions reported. [place] PMP: PDMP reviewed during this encounter.       Compliance: No problems identified. Effectiveness: Clinically acceptable. Plan: Refer to "POC".  UDS:  Summary  Date Value Ref Range Status  07/13/2018 FINAL  Final    Comment:    ==================================================================== TOXASSURE SELECT 13 (MW) ==================================================================== Test                             Result       Flag       Units   NO DRUGS DETECTED. ==================================================================== Test                      Result    Flag   Units      Ref Range   Creatinine              101              mg/dL      >=20 ==================================================================== Declared Medications:  The flagging and interpretation on this report are based on the  following declared medications.  Unexpected results may arise from  inaccuracies in the declared medications.  **Note: The testing scope of this panel does not include following  reported medications:  Bupropion  Celecoxib  Citalopram  Furosemide  Gabapentin  Glycopyrrolate  Hydroxyzine  Ibuprofen   Levorphanol  Levothyroxine  Loratadine  Metoprolol  Ondansetron  Pantoprazole  Prednisone  Trazodone ==================================================================== For clinical consultation, please call [phone]. ====================================================================    Laboratory Chemistry Profile (12 mo)  Renal: No results found for requested labs within last 8760 hours.  Lab Results  Component Value Date   GFRAA >60 06/23/2017   GFRNONAA >60 06/23/2017   Hepatic: No results found for requested labs within last 8760 hours. Lab Results  Component Value Date   AST 20 06/23/2017   ALT 9 (L) 06/23/2017   Other: No results found for requested labs within last 8760 hours. Note: Above Lab results reviewed.  Imaging  Last 90 days:  Dg Chest 2 View  Result Date: 01/12/2019 CLINICAL DATA:  63 year old female with cough. Recent pneumonia. EXAM: CHEST - 2 VIEW COMPARISON:  None. FINDINGS: The lungs are clear. There is no pleural effusion pneumothorax. Top-normal cardiac size. There is degenerative changes of the spine and shoulders. A stimulator noted over the midthoracic spine. Multiple surgical clips noted in the left upper abdomen. No acute osseous pathology. IMPRESSION: No active cardiopulmonary disease. Electronically Signed   By: Anner Crete M.D.   On: 01/12/2019 09:34   Ct Shoulder Right Wo Contrast  Result Date: 12/13/2018 CLINICAL DATA:  Right shoulder pain.  Can not lift the right arm. EXAM: CT OF THE UPPER RIGHT EXTREMITY WITHOUT CONTRAST TECHNIQUE: Multidetector CT imaging of the upper right extremity was performed according to the standard protocol. COMPARISON:  None. FINDINGS: Bones/Joint/Cartilage No fracture or dislocation.  Normal alignment. No joint effusion. Mild right glenohumeral joint space narrowing with marginal osteophytes. Severe narrowing of the acromial humeral distance as can be seen with a chronic rotator cuff tear. No aggressive  osseous lesion. No periosteal reaction or bone destruction. Ligaments Ligaments are suboptimally evaluated by CT. Muscles and Tendons Muscle atrophy of the supraspinatus muscle. Prior rotator cuff repair with loss of the normal acromiohumeral distance most concerning for recurrent rotator cuff tear. Soft tissue No fluid collection or hematoma. No soft tissue mass. Visualized right lung is clear. IMPRESSION: 1. Mild osteoarthritis of right glenohumeral joint. 2. Severe narrowing of the acromial humeral distance as can be seen with a chronic rotator cuff tear. Electronically Signed   By: Kathreen Devoid   On: 12/13/2018 16:09    Assessment  The primary encounter diagnosis was Spondylosis of cervical region without myelopathy or radiculopathy. Diagnoses of History of lumbar fusion, Lumbar spondylosis, Fibromyalgia, Major depressive disorder, remission status unspecified, unspecified whether recurrent, DDD (degenerative disc disease), cervical, Lumbar degenerative disc disease, Cervical facet joint syndrome (L>R, C4/5/6/7), Spinal cord stimulator status, and Chronic pain syndrome were also pertinent to this visit.  Plan of Care  I have changed [place]. Goin "[name]"'s levorphanol. I am also having her start on levorphanol and levorphanol. Additionally, I am having her maintain her glycopyrrolate, levothyroxine, loratadine, metoprolol succinate, pantoprazole, ondansetron, ibuprofen, predniSONE, celecoxib, furosemide, glycopyrrolate, metoprolol succinate, gabapentin, azelastine, vitamin B-12, cefdinir, azelastine, traZODone, hydrOXYzine, citalopram, and buPROPion.  Pharmacotherapy (Medications Ordered): Meds ordered this encounter  Medications  . levorphanol (LEVODROMORAN) 2 MG tablet    Sig: 3 mg in am, 2 mg in afternoon, and 2mg  evening For chronic pain, 30 day Rx    Dispense:  105 tablet    Refill:  0  . levorphanol (LEVODROMORAN) 2 MG tablet    Sig: 3 mg in am, 2 mg in afternoon, and 2mg  evening For  chronic pain, 30 day Rx    Dispense:  105 tablet    Refill:  0  . levorphanol (LEVODROMORAN) 2 MG tablet    Sig: 3 mg in am, 2 mg in afternoon, and 2mg  evening For chronic pain, 30 day Rx    Dispense:  105 tablet    Refill:  0   Orders Placed This Encounter  Procedures  . ToxASSURE Select 13 (MW), Urine    Volume: 30 ml(s). Minimum 3 ml of urine is needed. Document temperature of fresh sample. Indications: Long term (current) use of opiate analgesic [id number])   Pain contract reviewed with patient.  Patient counseled on safe storage strategies of her medications.  Patient endorsed understanding.  Continue gabapentin, ibuprofen as prescribed.  Follow-up plan:   Return in about 3 months (around 04/19/2019) for Medication Management.    Recent Visits No visits were found meeting these conditions.  Showing recent visits within past 90 days and meeting all other requirements   Today's Visits Date Type Provider Dept  01/18/19 Office Visit [name], MD [facility]  Showing today's visits and meeting all other requirements   Future Appointments No visits were found meeting these conditions.  Showing future appointments within next 90 days and meeting all other requirements   I discussed the assessment and treatment plan with the patient. The patient was provided an opportunity to ask questions and all were answered. The patient agreed with the plan and demonstrated an understanding of the instructions.  Patient advised to call back or seek an in-person evaluation if the symptoms or condition worsens.  Total duration of non-face-to-face encounter: 68minutes.  Note by: [name], MD Date: 01/18/2019; Time: 2:19 PM  Note: This dictation was prepared with Dragon dictation. Any transcriptional errors that may result from this process are unintentional.  Disclaimer:  * Given the special circumstances of the COVID-19 pandemic, the federal government has announced that  the Office for Civil Rights (OCR) will exercise its enforcement discretion and will not impose penalties on physicians using telehealth in the event of noncompliance with regulatory requirements under the [place] and [place] (HIPAA) in connection with the good faith provision of telehealth during the XX123456 national public health emergency. ([place])

## 2019-01-19 ENCOUNTER — Encounter: Payer: 59 | Admitting: Student in an Organized Health Care Education/Training Program

## 2019-02-03 ENCOUNTER — Inpatient Hospital Stay: Admission: RE | Admit: 2019-02-03 | Payer: 59 | Source: Ambulatory Visit

## 2019-02-10 ENCOUNTER — Encounter: Payer: Self-pay | Admitting: Psychiatry

## 2019-02-10 ENCOUNTER — Ambulatory Visit (INDEPENDENT_AMBULATORY_CARE_PROVIDER_SITE_OTHER): Payer: 59 | Admitting: Psychiatry

## 2019-02-10 ENCOUNTER — Other Ambulatory Visit: Payer: Self-pay

## 2019-02-10 DIAGNOSIS — F331 Major depressive disorder, recurrent, moderate: Secondary | ICD-10-CM | POA: Diagnosis not present

## 2019-02-10 DIAGNOSIS — F5105 Insomnia due to other mental disorder: Secondary | ICD-10-CM

## 2019-02-10 DIAGNOSIS — F411 Generalized anxiety disorder: Secondary | ICD-10-CM | POA: Diagnosis not present

## 2019-02-10 DIAGNOSIS — R413 Other amnesia: Secondary | ICD-10-CM

## 2019-02-10 MED ORDER — TRAZODONE HCL 100 MG PO TABS: 100.0000 mg | ORAL_TABLET | Freq: Every evening | ORAL | 0 refills | Status: DC | PRN

## 2019-02-10 MED ORDER — BUPROPION HCL ER (SR) 200 MG PO TB12: 200.0000 mg | ORAL_TABLET | Freq: Two times a day (BID) | ORAL | 1 refills | Status: DC

## 2019-02-10 NOTE — Progress Notes (Signed)
Virtual Visit via Telephone Note  I connected with Connie [name] on 02/10/19 at 10:30 AM EDT by telephone and verified that I am speaking with the correct person using two identifiers.   I discussed the limitations, risks, security and privacy concerns of performing an evaluation and management service by telephone and the availability of in person appointments. I also discussed with the patient that there may be a patient responsible charge related to this service. The patient expressed understanding and agreed to proceed.    I discussed the assessment and treatment plan with the patient. The patient was provided an opportunity to ask questions and all were answered. The patient agreed with the plan and demonstrated an understanding of the instructions.   The patient was advised to call back or seek an in-person evaluation if the symptoms worsen or if the condition fails to improve as anticipated. Sharon MD OP Progress Note  02/10/2019 10:57 AM Connie [name] Connie [name]  MRN:  [id number]  Chief Complaint:  Chief Complaint    Follow-up     HPI: Connie [name] is a 63 year old Caucasian female, married, unassisted, lives in [place], has a history of depression, anxiety, history of delusional parasitosis, chronic pain on opiate medication, fibromyalgia, hypothyroidism, hypertension, polyarthralgia was evaluated by phone today.  Patient preferred to do a phone call today.  Patient today reports she continues to struggle with fatigue, pain during the day.  She reports she is on new pain medication-levorphanol.  She reports it makes her extremely sleepy during the day.  She reports she can barely keep her eyes open most of the day.  She continues to have no difficulty sleeping at night.  She reports she did struggle with some restlessness of her legs and was started on Requip by her primary care provider which helps.  Patient reports because of her excessive sleep during the day she can barely help her husband do  the chores around the home.  That is kind of frustrating for her.  Patient reports she currently takes a lower dosage of trazodone at night since she is able to sleep better.  Patient denies any suicidality, homicidality or perceptual disturbances.  Patient reports she continues to struggle with memory problems, struggles with short-term memory loss.  She reports she however has an appointment coming up with neurology soon.  She denies any other concerns today. Visit Diagnosis:    ICD-10-CM   1. MDD (major depressive disorder), recurrent episode, moderate (HCC)  F33.1 traZODone (DESYREL) 100 MG tablet    buPROPion (WELLBUTRIN SR) 200 MG 12 hr tablet  2. GAD (generalized anxiety disorder)  F41.1   3. Insomnia due to mental disorder  F51.05 traZODone (DESYREL) 100 MG tablet  4. Memory loss  R41.3     Past Psychiatric History: I have reviewed past psychiatric history from a progress note on 08/06/2017.  Past trials of Latuda, Wellbutrin, Cymbalta  Past Medical History:  Past Medical History:  Diagnosis Date  . Anemia    in the past  . Anxiety   . Arthritis    all over  . Cervical spondylosis with myelopathy 2018  . Chronic pain 2019   can't stand straight, uses walker for stability  . DDD (degenerative disc disease), lumbar   . Depression   . Displacement of cervical intervertebral disc 2018  . Dyspnea    HANDICAPPED  . Fibromyalgia    Polymyalgia Rheumatica  . GERD (gastroesophageal reflux disease)   . Heart murmur    not treated or  being followed  . Hypertension    CONTROLLED  . Hypothyroidism   . Lumbar post-laminectomy syndrome 2018  . Lumbosacral radiculitis 2018  . Neuropathy due to medical condition (HCC)    feet, toes, fingers  . Osteoarthritis   . Osteoporosis   . Other long term (current) drug therapy 2019   from pain management  . Pain in the coccyx 2018   disorder of sacrum  . Spondylolisthesis 2018  . Spondylosis of lumbosacral region without myelopathy  or radiculopathy 2018  . Spondylosis with myelopathy, lumbar region 2018  . Vitamin D deficiency     Past Surgical History:  Procedure Laterality Date  . ABDOMINAL HYSTERECTOMY  2000  . APPENDECTOMY    . arm surgery  2002  . BREAST BIOPSY Left 2005   benign  . CATARACT EXTRACTION W/PHACO Right 10/21/2017   Procedure: CATARACT EXTRACTION PHACO AND INTRAOCULAR LENS PLACEMENT ([place]) RIGHT;  Surgeon: Leandrew Koyanagi, MD;  Location: [facility];  Service: Ophthalmology;  Laterality: Right;  neck issues  . CESAREAN SECTION     x 2  . CHOLECYSTECTOMY    . COLONOSCOPY WITH PROPOFOL N/A 08/25/2017   Procedure: COLONOSCOPY WITH PROPOFOL;  Surgeon: Toledo, Benay Pike, MD;  Location: [facility];  Service: Gastroenterology;  Laterality: N/A;  . epidural injection  2018   steroids  . GASTRIC BYPASS  2004  . JOINT REPLACEMENT Bilateral 2005   knees  . LUMBAR FUSION  2016   x 2/ L3-5  . SHOULDER ARTHROSCOPY WITH ROTATOR CUFF REPAIR Right 2008   x 3  . SPINAL CORD STIMULATOR IMPLANT  2018   [facility] scientific  . TEMPORAL ARTERY BIOPSY / LIGATION    . TONSILLECTOMY      Family Psychiatric History: I have reviewed family psychiatric history per my progress note on 08/06/2017.  Family History:  Family History  Problem Relation Age of Onset  . Kidney disease Mother   . Cancer Mother   . Anxiety disorder Mother   . Depression Mother   . Breast cancer Mother   . Cancer Father   . Hypertension Father   . Cancer Brother   . Breast cancer Maternal Aunt   . Breast cancer Cousin     Social History: I have reviewed social history from my progress note on 08/06/2017. Social History   Socioeconomic History  . Marital status: Married    Spouse name: brady   . Number of children: 2  . Years of education: Not on file  . Highest education level: High school graduate  Occupational History    Comment: disabled   Social Needs  . Financial resource strain: Somewhat hard  . Food  insecurity    Worry: Never true    Inability: Never true  . Transportation needs    Medical: No    Non-medical: No  Tobacco Use  . Smoking status: Former Smoker    Packs/day: 0.50    Years: 20.00    Pack years: 10.00    Types: Cigarettes    Quit date: 05/21/2007    Years since quitting: 11.7  . Smokeless tobacco: Never Used  Substance and Sexual Activity  . Alcohol use: No    Frequency: Never  . Drug use: No    Comment: managed by pain clinic  . Sexual activity: Not Currently  Lifestyle  . Physical activity    Days per week: 0 days    Minutes per session: 0 min  . Stress: Not on file  Relationships  .  Social Herbalist on phone: Three times a week    Gets together: Once a week    Attends religious service: More than 4 times per year    Active member of club or organization: No    Attends meetings of clubs or organizations: Never    Relationship status: Married  Other Topics Concern  . Not on file  Social History Narrative  . Not on file    Allergies:  Allergies  Allergen Reactions  . Tape Rash    Skin peels off and blisters.  Probably was clear plastic tape.    Metabolic Disorder Labs: No results found for: HGBA1C, MPG No results found for: PROLACTIN No results found for: CHOL, TRIG, HDL, CHOLHDL, VLDL, LDLCALC No results found for: TSH  Therapeutic Level Labs: No results found for: LITHIUM No results found for: VALPROATE No components found for:  CBMZ  Current Medications: Current Outpatient Medications  Medication Sig Dispense Refill  . rOPINIRole (REQUIP) 0.5 MG tablet Take by mouth.    Marland Kitchen azelastine (ASTELIN) 0.1 % nasal spray Place into the nose.    Marland Kitchen azelastine (ASTELIN) 0.1 % nasal spray U 1 SPR IEN BID    . buPROPion (WELLBUTRIN SR) 200 MG 12 hr tablet Take 1 tablet (200 mg total) by mouth 2 (two) times daily. 60 tablet 1  . cefdinir (OMNICEF) 300 MG capsule Take 300 mg by mouth daily.     . celecoxib (CELEBREX) 200 MG capsule Take 200  mg by mouth 2 (two) times daily. AM    . citalopram (CELEXA) 20 MG tablet Take 1 tablet (20 mg total) by mouth daily. 90 tablet 0  . furosemide (LASIX) 20 MG tablet Take 20 mg by mouth as needed.    . gabapentin (NEURONTIN) 300 MG capsule Take 1 capsule (300 mg total) by mouth 2 (two) times daily. 60 capsule 5  . glycopyrrolate (ROBINUL) 1 MG tablet Take 1 mg by mouth 2 (two) times daily. AM AND HS  3  . glycopyrrolate (ROBINUL) 1 MG tablet TAKE 1 TABLET(1 MG) BY MOUTH TWICE DAILY    . hydrOXYzine (VISTARIL) 25 MG capsule Take 1 capsule (25 mg total) by mouth 3 (three) times daily as needed. FOR severe anxiety as well as for sleep 90 capsule 2  . ibuprofen (ADVIL,MOTRIN) 200 MG tablet Take 400 mg by mouth daily as needed for headache or moderate pain.    Marland Kitchen levofloxacin (LEVAQUIN) 500 MG tablet     . levorphanol (LEVODROMORAN) 2 MG tablet 3 mg in am, 2 mg in afternoon, and 2mg  evening For chronic pain, 30 day Rx 105 tablet 0  . [START ON 02/18/2019] levorphanol (LEVODROMORAN) 2 MG tablet 3 mg in am, 2 mg in afternoon, and 2mg  evening For chronic pain, 30 day Rx 105 tablet 0  . [START ON 03/20/2019] levorphanol (LEVODROMORAN) 2 MG tablet 3 mg in am, 2 mg in afternoon, and 2mg  evening For chronic pain, 30 day Rx 105 tablet 0  . levothyroxine (SYNTHROID) 50 MCG tablet     . levothyroxine (SYNTHROID, LEVOTHROID) 25 MCG tablet 50 mcg.   0  . loratadine (CLARITIN) 10 MG tablet Take 10 mg by mouth daily as needed for allergies.    . metoprolol succinate (TOPROL-XL) 100 MG 24 hr tablet Take 100 mg by mouth once daily in the evening  0  . metoprolol succinate (TOPROL-XL) 100 MG 24 hr tablet Take by mouth.    . ondansetron (ZOFRAN-ODT) 8 MG disintegrating  tablet Take 8 mg by mouth daily as needed for nausea or vomiting.    . pantoprazole (PROTONIX) 40 MG tablet Take 40 mg by mouth once daily in the evening    . predniSONE (DELTASONE) 5 MG tablet Take 4 mg by mouth daily.    . traZODone (DESYREL) 100 MG  tablet Take 1 tablet (100 mg total) by mouth at bedtime as needed for sleep. Patient has supplies 90 tablet 0  . vitamin B-12 (CYANOCOBALAMIN) 1000 MCG tablet Take by mouth.     No current facility-administered medications for this visit.      Musculoskeletal: Strength & Muscle Tone: UTA Gait & Station: Uses walker Patient leans: N/A  Psychiatric Specialty Exam: Review of Systems  Constitutional: Positive for malaise/fatigue.  Musculoskeletal: Positive for joint pain and myalgias.  Psychiatric/Behavioral: Positive for depression. The patient has insomnia.   All other systems reviewed and are negative.   There were no vitals taken for this visit.There is no height or weight on file to calculate BMI.  General Appearance: UTA  Eye Contact:  UTA  Speech:  Clear and Coherent  Volume:  Decreased  Mood:  Dysphoric  Affect:  UTA  Thought Process:  Goal Directed and Descriptions of Associations: Intact  Orientation:  Full (Time, Place, and Person)  Thought Content: Logical   Suicidal Thoughts:  No  Homicidal Thoughts:  No  Memory:  Immediate;   limited Recent;   limited Remote;   Fair  Judgement:  Fair  Insight:  Fair  Psychomotor Activity:  UTA  Concentration:  Concentration: Fair and Attention Span: Fair  Recall:  [employer] of Knowledge: Fair  Language: Fair  Akathisia:  No  Handed:  Right  AIMS (if indicated): UTA  Assets:  Communication Skills Desire for Improvement Housing Intimacy Social Support  ADL's:  Intact  Cognition: WNL  Sleep:  Poor   Screenings: PHQ2-9     Office Visit from 07/13/2018 in [facility] Clinical Support from 04/15/2018 in [facility] Clinical Support from 01/27/2018 in [facility] Clinical Support from 11/05/2017 in [facility] Procedure visit from 08/19/2017 in [facility]  PHQ-2 Total Score  0  0  0  0  0       Assessment and Plan: Connie [name] is a 63 year old caucasian female who has a history of depression, chronic pain, GERD , hypothyroidism , hypertension, fibromyalgia was evaluated by phone today .  Patient today reports she struggles with excessive daytime sleepiness more so because of her new pain medication.  She also struggles with memory problems.  Plan as noted below.  Plan MDD-some progress Change Wellbutrin to SR 200 mg p.o. twice daily Celexa 20 mg p.o. daily Discussed with patient the drug to drug interaction between Levorphanol as well as SSRIs including serotonin syndrome.  Anxiety disorder-improving Celexa as prescribed Hydroxyzine 25 mg 3 times a day as needed.  Insomnia-improved Reduce trazodone 200 mg p.o. nightly as needed Hydroxyzine as prescribed  For memory problems-unstable Patient has upcoming appointment with neurology. Patient is on polypharmacy which could be contributing to her memory issues. Discussed with patient to limit the use of hydroxyzine which can also make her memory problems worse.  Follow-up in clinic in 1 month or sooner if needed.  Nov 23rd at 2 PM  I have spent atleast 15 minutes non face  to face with patient today. More than 50 % of the time was spent for psychoeducation and supportive psychotherapy and care coordination. This note was generated in part or whole with voice recognition software. Voice recognition is usually quite accurate but there are transcription errors that can and very often do occur. I apologize for any typographical errors that were not detected and corrected.          Ursula Alert, MD 02/10/2019, 10:57 AM

## 2019-02-14 ENCOUNTER — Other Ambulatory Visit: Admission: RE | Admit: 2019-02-14 | Payer: 59 | Source: Ambulatory Visit

## 2019-02-24 ENCOUNTER — Other Ambulatory Visit: Payer: Self-pay | Admitting: Psychiatry

## 2019-02-24 DIAGNOSIS — F321 Major depressive disorder, single episode, moderate: Secondary | ICD-10-CM

## 2019-03-17 ENCOUNTER — Other Ambulatory Visit: Payer: Self-pay | Admitting: Student in an Organized Health Care Education/Training Program

## 2019-03-21 ENCOUNTER — Other Ambulatory Visit: Payer: Self-pay

## 2019-03-21 ENCOUNTER — Encounter: Payer: Self-pay | Admitting: Psychiatry

## 2019-03-21 ENCOUNTER — Ambulatory Visit (INDEPENDENT_AMBULATORY_CARE_PROVIDER_SITE_OTHER): Payer: 59 | Admitting: Psychiatry

## 2019-03-21 DIAGNOSIS — F331 Major depressive disorder, recurrent, moderate: Secondary | ICD-10-CM | POA: Diagnosis not present

## 2019-03-21 DIAGNOSIS — F5105 Insomnia due to other mental disorder: Secondary | ICD-10-CM

## 2019-03-21 DIAGNOSIS — F411 Generalized anxiety disorder: Secondary | ICD-10-CM

## 2019-03-21 DIAGNOSIS — R413 Other amnesia: Secondary | ICD-10-CM

## 2019-03-21 MED ORDER — BUPROPION HCL ER (SR) 200 MG PO TB12: 200.0000 mg | ORAL_TABLET | Freq: Two times a day (BID) | ORAL | 1 refills | Status: DC

## 2019-03-21 MED ORDER — CITALOPRAM HYDROBROMIDE 20 MG PO TABS: 20.0000 mg | ORAL_TABLET | Freq: Every day | ORAL | 0 refills | Status: DC

## 2019-03-21 MED ORDER — TRAZODONE HCL 100 MG PO TABS: 100.0000 mg | ORAL_TABLET | Freq: Every evening | ORAL | 0 refills | Status: DC | PRN

## 2019-03-21 NOTE — Progress Notes (Signed)
Virtual Visit via Telephone Note  I connected with Connie [name] on 03/21/19 at  2:00 PM EST by telephone and verified that I am speaking with the correct person using two identifiers.   I discussed the limitations, risks, security and privacy concerns of performing an evaluation and management service by telephone and the availability of in person appointments. I also discussed with the patient that there may be a patient responsible charge related to this service. The patient expressed understanding and agreed to proceed.      I discussed the assessment and treatment plan with the patient. The patient was provided an opportunity to ask questions and all were answered. The patient agreed with the plan and demonstrated an understanding of the instructions.   The patient was advised to call back or seek an in-person evaluation if the symptoms worsen or if the condition fails to improve as anticipated.   [place] MD OP Progress Note  03/21/2019 2:25 PM [name]  MRN:  [id number]  Chief Complaint:  Chief Complaint    Follow-up     HPI: Connie [name] is a 63 year old Caucasian female, married, on SSD, lives in [place], has a history of depression, anxiety disorder, history of delusional parasitosis, chronic pain on opioid medication, fibromyalgia, hypothyroidism, hypertension, polyarthralgia was evaluated by phone today.  Patient preferred to do a phone call.  Patient today reports she is currently depressed.  She lost her dog 'Mel Almond" recently.  He had to be put down since he was struggling with health issues and was very old.  She reports he was 62 years old.  There was nothing else they could do for him.  She became very tearful when she discussed her dog who died.  She reports she is currently taking it day by day.  She reports the dosage changes which was made last time with the Wellbutrin has definitely helped.  Patient is compliant on her medications.  Patient denies any suicidality,  homicidality or perceptual disturbances.  She reports sleep is better.  Patient denies any other concerns today.   Visit Diagnosis:    ICD-10-CM   1. MDD (major depressive disorder), recurrent episode, moderate (HCC)  F33.1 buPROPion (WELLBUTRIN SR) 200 MG 12 hr tablet    citalopram (CELEXA) 20 MG tablet    traZODone (DESYREL) 100 MG tablet  2. GAD (generalized anxiety disorder)  F41.1    improving  3. Insomnia due to mental disorder  F51.05 citalopram (CELEXA) 20 MG tablet    traZODone (DESYREL) 100 MG tablet   improving  4. Memory loss  R41.3     Past Psychiatric History: I have reviewed past psychiatric history from my progress note on 08/06/2017.  Past trials of Latuda, Wellbutrin, Cymbalta.  Past Medical History:  Past Medical History:  Diagnosis Date  . Anemia    in the past  . Anxiety   . Arthritis    all over  . Cervical spondylosis with myelopathy 2018  . Chronic pain 2019   can't stand straight, uses walker for stability  . DDD (degenerative disc disease), lumbar   . Depression   . Displacement of cervical intervertebral disc 2018  . Dyspnea    HANDICAPPED  . Fibromyalgia    Polymyalgia Rheumatica  . GERD (gastroesophageal reflux disease)   . Heart murmur    not treated or being followed  . Hypertension    CONTROLLED  . Hypothyroidism   . Lumbar post-laminectomy syndrome 2018  . Lumbosacral radiculitis 2018  . Neuropathy  due to medical condition (HCC)    feet, toes, fingers  . Osteoarthritis   . Osteoporosis   . Other long term (current) drug therapy 2019   from pain management  . Pain in the coccyx 2018   disorder of sacrum  . Spondylolisthesis 2018  . Spondylosis of lumbosacral region without myelopathy or radiculopathy 2018  . Spondylosis with myelopathy, lumbar region 2018  . Vitamin D deficiency     Past Surgical History:  Procedure Laterality Date  . ABDOMINAL HYSTERECTOMY  2000  . APPENDECTOMY    . arm surgery  2002  . BREAST BIOPSY  Left 2005   benign  . CATARACT EXTRACTION W/PHACO Right 10/21/2017   Procedure: CATARACT EXTRACTION PHACO AND INTRAOCULAR LENS PLACEMENT ([place]) RIGHT;  Surgeon: Leandrew Koyanagi, MD;  Location: [facility];  Service: Ophthalmology;  Laterality: Right;  neck issues  . CESAREAN SECTION     x 2  . CHOLECYSTECTOMY    . COLONOSCOPY WITH PROPOFOL N/A 08/25/2017   Procedure: COLONOSCOPY WITH PROPOFOL;  Surgeon: Toledo, Benay Pike, MD;  Location: [facility];  Service: Gastroenterology;  Laterality: N/A;  . epidural injection  2018   steroids  . GASTRIC BYPASS  2004  . JOINT REPLACEMENT Bilateral 2005   knees  . LUMBAR FUSION  2016   x 2/ L3-5  . SHOULDER ARTHROSCOPY WITH ROTATOR CUFF REPAIR Right 2008   x 3  . SPINAL CORD STIMULATOR IMPLANT  2018   [facility] scientific  . TEMPORAL ARTERY BIOPSY / LIGATION    . TONSILLECTOMY      Family Psychiatric History: I have reviewed family psychiatric history from my progress note on 08/06/2017.  Family History:  Family History  Problem Relation Age of Onset  . Kidney disease Mother   . Cancer Mother   . Anxiety disorder Mother   . Depression Mother   . Breast cancer Mother   . Cancer Father   . Hypertension Father   . Cancer Brother   . Breast cancer Maternal Aunt   . Breast cancer Cousin     Social History: I have reviewed social history from my progress note on 08/06/2017. Social History   Socioeconomic History  . Marital status: Married    Spouse name: brady   . Number of children: 2  . Years of education: Not on file  . Highest education level: High school graduate  Occupational History    Comment: disabled   Social Needs  . Financial resource strain: Somewhat hard  . Food insecurity    Worry: Never true    Inability: Never true  . Transportation needs    Medical: No    Non-medical: No  Tobacco Use  . Smoking status: Former Smoker    Packs/day: 0.50    Years: 20.00    Pack years: 10.00    Types: Cigarettes     Quit date: 05/21/2007    Years since quitting: 11.8  . Smokeless tobacco: Never Used  Substance and Sexual Activity  . Alcohol use: No    Frequency: Never  . Drug use: No    Comment: managed by pain clinic  . Sexual activity: Not Currently  Lifestyle  . Physical activity    Days per week: 0 days    Minutes per session: 0 min  . Stress: Not on file  Relationships  . Social Herbalist on phone: Three times a week    Gets together: Once a week    Attends religious  service: More than 4 times per year    Active member of club or organization: No    Attends meetings of clubs or organizations: Never    Relationship status: Married  Other Topics Concern  . Not on file  Social History Narrative  . Not on file    Allergies:  Allergies  Allergen Reactions  . Tape Rash    Skin peels off and blisters.  Probably was clear plastic tape.    Metabolic Disorder Labs: No results found for: HGBA1C, MPG No results found for: PROLACTIN No results found for: CHOL, TRIG, HDL, CHOLHDL, VLDL, LDLCALC No results found for: TSH  Therapeutic Level Labs: No results found for: LITHIUM No results found for: VALPROATE No components found for:  CBMZ  Current Medications: Current Outpatient Medications  Medication Sig Dispense Refill  . azelastine (ASTELIN) 0.1 % nasal spray Place into the nose.    Marland Kitchen azelastine (ASTELIN) 0.1 % nasal spray U 1 SPR IEN BID    . buPROPion (WELLBUTRIN SR) 200 MG 12 hr tablet Take 1 tablet (200 mg total) by mouth 2 (two) times daily. 180 tablet 1  . cefdinir (OMNICEF) 300 MG capsule Take 300 mg by mouth daily.     . celecoxib (CELEBREX) 200 MG capsule Take 200 mg by mouth 2 (two) times daily. AM    . citalopram (CELEXA) 20 MG tablet Take 1 tablet (20 mg total) by mouth daily. 90 tablet 0  . furosemide (LASIX) 20 MG tablet Take 20 mg by mouth as needed.    . gabapentin (NEURONTIN) 300 MG capsule Take 1 capsule (300 mg total) by mouth 2 (two) times daily. 60  capsule 5  . glycopyrrolate (ROBINUL) 1 MG tablet Take 1 mg by mouth 2 (two) times daily. AM AND HS  3  . glycopyrrolate (ROBINUL) 1 MG tablet TAKE 1 TABLET(1 MG) BY MOUTH TWICE DAILY    . hydrOXYzine (VISTARIL) 25 MG capsule Take 1 capsule (25 mg total) by mouth 3 (three) times daily as needed. FOR severe anxiety as well as for sleep 90 capsule 2  . ibuprofen (ADVIL,MOTRIN) 200 MG tablet Take 400 mg by mouth daily as needed for headache or moderate pain.    Marland Kitchen levofloxacin (LEVAQUIN) 500 MG tablet     . levorphanol (LEVODROMORAN) 2 MG tablet 3 mg in am, 2 mg in afternoon, and 2mg  evening For chronic pain, 30 day Rx 105 tablet 0  . levorphanol (LEVODROMORAN) 2 MG tablet 3 mg in am, 2 mg in afternoon, and 2mg  evening For chronic pain, 30 day Rx 105 tablet 0  . levothyroxine (SYNTHROID) 50 MCG tablet     . levothyroxine (SYNTHROID, LEVOTHROID) 25 MCG tablet 50 mcg.   0  . loratadine (CLARITIN) 10 MG tablet Take 10 mg by mouth daily as needed for allergies.    . metoprolol succinate (TOPROL-XL) 100 MG 24 hr tablet Take 100 mg by mouth once daily in the evening  0  . metoprolol succinate (TOPROL-XL) 100 MG 24 hr tablet Take by mouth.    . ondansetron (ZOFRAN-ODT) 8 MG disintegrating tablet Take 8 mg by mouth daily as needed for nausea or vomiting.    . pantoprazole (PROTONIX) 40 MG tablet Take 40 mg by mouth once daily in the evening    . predniSONE (DELTASONE) 5 MG tablet Take 4 mg by mouth daily.    Marland Kitchen rOPINIRole (REQUIP) 0.5 MG tablet Take by mouth.    . traZODone (DESYREL) 100 MG tablet Take 1  tablet (100 mg total) by mouth at bedtime as needed for sleep. 90 tablet 0  . vitamin B-12 (CYANOCOBALAMIN) 1000 MCG tablet Take by mouth.     No current facility-administered medications for this visit.      Musculoskeletal: Strength & Muscle Tone: UTA Gait & Station: Walks with a walker Patient leans: N/A  Psychiatric Specialty Exam: Review of Systems  Psychiatric/Behavioral: Positive for  depression.  All other systems reviewed and are negative.   There were no vitals taken for this visit.There is no height or weight on file to calculate BMI.  General Appearance: UTA  Eye Contact:  UTA  Speech:  Clear and Coherent  Volume:  Normal  Mood:  Depressed  Affect:  Tearful  Thought Process:  Goal Directed and Descriptions of Associations: Intact  Orientation:  Full (Time, Place, and Person)  Thought Content: Logical   Suicidal Thoughts:  No  Homicidal Thoughts:  No  Memory:  Immediate;   Fair Recent;   Fair Remote;   Fair  Judgement:  Fair  Insight:  Fair  Psychomotor Activity:  UTA  Concentration:  Concentration: Fair and Attention Span: Fair  Recall:  [employer] of Knowledge: Fair  Language: Fair  Akathisia:  No  Handed:  Right  AIMS (if indicated): UTA  Assets:  Communication Skills Desire for Improvement Housing Social Support  ADL's:  Intact  Cognition: WNL  Sleep:  Fair   Screenings: PHQ2-9     Office Visit from 07/13/2018 in [facility] Clinical Support from 04/15/2018 in [facility] Clinical Support from 01/27/2018 in [facility] Clinical Support from 11/05/2017 in [facility] Procedure visit from 08/19/2017 in [facility]  PHQ-2 Total Score  0  0  0  0  0       Assessment and Plan: Connie [name] is a 63 year old Caucasian female who has a history of depression, chronic pain, GERD, hypothyroidism, hypertension, fibromyalgia was evaluated by phone today.  Patient is currently struggling with psychosocial stressors of her health issues, chronic pain as well as death of her dog.  Patient however is currently making progress with recent medication changes.  Plan as noted below.  Plan MDD-improving  Wellbutrin SR 200 mg p.o. twice daily Celexa 20 mg p.o.  daily Discussed with patient drug to drug interaction between levorphanol as well as SSRIs including serotonin syndrome.  Anxiety disorder-stable Celexa as prescribed Hydroxyzine 25 mg p.o. 3 times daily as needed  Insomnia-stable  Trazodone 200 mg p.o. nightly as needed-reduced dosage Hydroxyzine as prescribed  Memory problems-unstable Patient to continue to work with neurology.  She is on polypharmacy which could be contributing to her memory loss.   Follow-up in clinic in 1- 2 months or sooner if needed.  January 5 at 10 AM   I have spent atleast 15 minutes non  face to face with patient today. More than 50 % of the time was spent for psychoeducation and supportive psychotherapy and care coordination. This note was generated in part or whole with voice recognition software. Voice recognition is usually quite accurate but there are transcription errors that can and very often do occur. I apologize for any typographical errors that were not detected and corrected.       Ursula Alert, MD 03/21/2019, 2:25 PM

## 2019-03-28 ENCOUNTER — Telehealth: Payer: Self-pay | Admitting: *Deleted

## 2019-03-28 DIAGNOSIS — M797 Fibromyalgia: Secondary | ICD-10-CM

## 2019-03-28 DIAGNOSIS — M5136 Other intervertebral disc degeneration, lumbar region: Secondary | ICD-10-CM

## 2019-03-28 MED ORDER — GABAPENTIN 300 MG PO CAPS: 300.0000 mg | ORAL_CAPSULE | Freq: Two times a day (BID) | ORAL | 5 refills | Status: DC

## 2019-03-28 NOTE — Telephone Encounter (Signed)
Dr. Holley Raring,                Patient is asking for Gabapentin refill. She has an upcoming appointment with you 12/22 but needs refill before then. Pharmacy on file confirmed. Thank you- Anderson Malta

## 2019-04-05 ENCOUNTER — Other Ambulatory Visit: Payer: Self-pay | Admitting: Psychiatry

## 2019-04-05 DIAGNOSIS — F331 Major depressive disorder, recurrent, moderate: Secondary | ICD-10-CM

## 2019-04-06 ENCOUNTER — Other Ambulatory Visit: Payer: Self-pay | Admitting: Internal Medicine

## 2019-04-06 DIAGNOSIS — Z1231 Encounter for screening mammogram for malignant neoplasm of breast: Secondary | ICD-10-CM

## 2019-04-08 ENCOUNTER — Other Ambulatory Visit: Payer: Self-pay | Admitting: Psychiatry

## 2019-04-08 DIAGNOSIS — F5105 Insomnia due to other mental disorder: Secondary | ICD-10-CM

## 2019-04-08 DIAGNOSIS — F331 Major depressive disorder, recurrent, moderate: Secondary | ICD-10-CM

## 2019-04-18 ENCOUNTER — Other Ambulatory Visit: Payer: Self-pay | Admitting: Psychiatry

## 2019-04-18 ENCOUNTER — Telehealth: Payer: Self-pay

## 2019-04-18 DIAGNOSIS — F331 Major depressive disorder, recurrent, moderate: Secondary | ICD-10-CM

## 2019-04-18 DIAGNOSIS — F5105 Insomnia due to other mental disorder: Secondary | ICD-10-CM

## 2019-04-19 ENCOUNTER — Encounter: Payer: Self-pay | Admitting: Student in an Organized Health Care Education/Training Program

## 2019-04-19 ENCOUNTER — Ambulatory Visit
Payer: 59 | Attending: Student in an Organized Health Care Education/Training Program | Admitting: Student in an Organized Health Care Education/Training Program

## 2019-04-19 ENCOUNTER — Other Ambulatory Visit: Payer: Self-pay

## 2019-04-19 DIAGNOSIS — F325 Major depressive disorder, single episode, in full remission: Secondary | ICD-10-CM | POA: Diagnosis not present

## 2019-04-19 DIAGNOSIS — Z9689 Presence of other specified functional implants: Secondary | ICD-10-CM

## 2019-04-19 DIAGNOSIS — M797 Fibromyalgia: Secondary | ICD-10-CM

## 2019-04-19 DIAGNOSIS — Z9682 Presence of neurostimulator: Secondary | ICD-10-CM

## 2019-04-19 DIAGNOSIS — M5136 Other intervertebral disc degeneration, lumbar region: Secondary | ICD-10-CM

## 2019-04-19 DIAGNOSIS — Z981 Arthrodesis status: Secondary | ICD-10-CM

## 2019-04-19 DIAGNOSIS — M47812 Spondylosis without myelopathy or radiculopathy, cervical region: Secondary | ICD-10-CM | POA: Diagnosis not present

## 2019-04-19 DIAGNOSIS — G894 Chronic pain syndrome: Secondary | ICD-10-CM

## 2019-04-19 DIAGNOSIS — M51369 Other intervertebral disc degeneration, lumbar region without mention of lumbar back pain or lower extremity pain: Secondary | ICD-10-CM

## 2019-04-19 DIAGNOSIS — F329 Major depressive disorder, single episode, unspecified: Secondary | ICD-10-CM

## 2019-04-19 DIAGNOSIS — M47892 Other spondylosis, cervical region: Secondary | ICD-10-CM

## 2019-04-19 DIAGNOSIS — M47816 Spondylosis without myelopathy or radiculopathy, lumbar region: Secondary | ICD-10-CM

## 2019-04-19 MED ORDER — LEVORPHANOL TARTRATE 2 MG PO TABS: ORAL_TABLET | ORAL | 0 refills | Status: AC

## 2019-04-19 MED ORDER — LEVORPHANOL TARTRATE 2 MG PO TABS: ORAL_TABLET | ORAL | 0 refills | Status: DC

## 2019-04-19 NOTE — Progress Notes (Signed)
Virtual Encounter - Pain Management PROVIDER NOTE: Information contained herein reflects review and annotations entered in association with encounter. Patient information is provided elsewhere in the medical record. Interpretation of information and data should be left to medically trained personnel. Document created using STT technology, any transcriptional errors that may result from process are unintentional.  Contact & Pharmacy Preferred: [phone] Home: [phone] (home) Mobile: [phone] (mobile) E-mail: [email or web address] [address] [place] [address] Phone: [phone] Fax: [phone]   Pre-screening  Ms. Clucas offered "in-person" vs "virtual" encounter. She indicated preferring virtual for this encounter.   Reason COVID-19*  Social distancing based on CDC and AMA recommendations.   I contacted Nyeshia Tavani Perlman on 04/19/2019 via video conference.      I clearly identified myself as [name], MD. I verified that I was speaking with the correct person using two identifiers (Name: Carolyn Schlepp, and date of birth: [birth year]).  Consent I sought verbal advanced consent from [place] for virtual visit interactions. I informed Ms. Govea of possible security and privacy concerns, risks, and limitations associated with providing "not-in-person" medical evaluation and management services. I also informed Ms. Chhim of the availability of "in-person" appointments. Finally, I informed her that there would be a charge for the virtual visit and that she could be  personally, fully or partially, financially responsible for it. Ms. Engblom expressed understanding and agreed to proceed.   Historic Elements   Ms. Jazzmon Avella is a 63 y.o. year old, female patient evaluated today after her last encounter by our practice on 04/18/2019. Ms. Zeagler  has a past  medical history of Anemia, Anxiety, Arthritis, Cervical spondylosis with myelopathy (2018), Chronic pain (2019), DDD (degenerative disc disease), lumbar, Depression, Displacement of cervical intervertebral disc (2018), Dyspnea, Fibromyalgia, GERD (gastroesophageal reflux disease), Heart murmur, Hypertension, Hypothyroidism, Lumbar post-laminectomy syndrome (2018), Lumbosacral radiculitis (2018), Neuropathy due to medical condition ([place]), Osteoarthritis, Osteoporosis, Other long term (current) drug therapy (2019), Pain in the coccyx (2018), Spondylolisthesis (2018), Spondylosis of lumbosacral region without myelopathy or radiculopathy (2018), Spondylosis with myelopathy, lumbar region (2018), and Vitamin D deficiency. She also  has a past surgical history that includes Cesarean section; Gastric bypass (2004); Lumbar fusion (2016); Spinal cord stimulator implant (2018); Cholecystectomy; Appendectomy; Tonsillectomy; Shoulder arthroscopy with rotator cuff repair (Right, 2008); Joint replacement (Bilateral, 2005); Abdominal hysterectomy (2000); epidural injection (2018); arm surgery (2002); Breast biopsy (Left, 2005); Colonoscopy with propofol (N/A, 08/25/2017); Temporal artery biopsy / ligation; and Cataract extraction w/PHACO (Right, 10/21/2017). Ms. Polland has a current medication list which includes the following prescription(s): azelastine, azelastine, bupropion, cefdinir, celecoxib, citalopram, furosemide, gabapentin, glycopyrrolate, glycopyrrolate, hydroxyzine, ibuprofen, levofloxacin, [START ON 04/20/2019] levorphanol, [START ON 05/20/2019] levorphanol, [START ON 06/19/2019] levorphanol, levothyroxine, levothyroxine, loratadine, metoprolol succinate, metoprolol succinate, ondansetron, pantoprazole, prednisone, ropinirole, trazodone, and vitamin b-12. She  reports that she quit smoking about 11 years ago. Her smoking use included cigarettes. She has a 10.00 pack-year smoking history. She has never used smokeless  tobacco. She reports that she does not drink alcohol or use drugs. Ms. Zaremski is allergic to tape.   HPI  Today, she is being contacted for medication management.   No change in medical history since last visit (except Wellbutrin dose increased by psych- Dr Shea Evans which is helping her GAD).  Patient's pain is at baseline.  Patient continues multimodal pain regimen as prescribed.  States that it provides pain relief and improvement in functional status.   Pharmacotherapy Assessment  Analgesic:  03/22/2019  2   01/18/2019  Levorphanol 2 MG Tablet  105.00  30 Bi Lat   [id number]   Wal ([id number])   0  77.00 MME  Comm Ins   [place]    Monitoring: Pharmacotherapy: No side-effects or adverse reactions reported. [place] PMP: PDMP reviewed during this encounter.       Compliance: No problems identified. Effectiveness: Clinically acceptable. Plan: Refer to "POC".  UDS:  Summary  Date Value Ref Range Status  07/13/2018 FINAL  Final    Comment:    ==================================================================== TOXASSURE SELECT 13 (MW) ==================================================================== Test                             Result       Flag       Units   NO DRUGS DETECTED. ==================================================================== Test                      Result    Flag   Units      Ref Range   Creatinine              101              mg/dL      >=20 ==================================================================== Declared Medications:  The flagging and interpretation on this report are based on the  following declared medications.  Unexpected results may arise from  inaccuracies in the declared medications.  **Note: The testing scope of this panel does not include following  reported medications:  Bupropion  Celecoxib  Citalopram  Furosemide  Gabapentin  Glycopyrrolate  Hydroxyzine  Ibuprofen  Levorphanol  Levothyroxine  Loratadine  Metoprolol  Ondansetron   Pantoprazole  Prednisone  Trazodone ==================================================================== For clinical consultation, please call [phone]. ====================================================================    Laboratory Chemistry Profile (12 mo)  Renal: No results found for requested labs within last 8760 hours.  Lab Results  Component Value Date   GFRAA >60 06/23/2017   GFRNONAA >60 06/23/2017   Hepatic: No results found for requested labs within last 8760 hours. Lab Results  Component Value Date   AST 20 06/23/2017   ALT 9 (L) 06/23/2017   Other: No results found for requested labs within last 8760 hours. Note: Above Lab results reviewed.  Imaging  DG Chest 2 View CLINICAL DATA:  63 year old female with cough. Recent pneumonia.  EXAM: CHEST - 2 VIEW  COMPARISON:  None.  FINDINGS: The lungs are clear. There is no pleural effusion pneumothorax. Top-normal cardiac size. There is degenerative changes of the spine and shoulders. A stimulator noted over the midthoracic spine. Multiple surgical clips noted in the left upper abdomen. No acute osseous pathology.  IMPRESSION: No active cardiopulmonary disease.  Electronically Signed   By: Anner Crete M.D.   On: 01/12/2019 09:34   Assessment  The primary encounter diagnosis was Lumbar degenerative disc disease. Diagnoses of Fibromyalgia, Spondylosis of cervical region without myelopathy or radiculopathy, History of lumbar fusion, Lumbar spondylosis, Major depressive disorder, remission status unspecified, unspecified whether recurrent, Spinal cord stimulator status, Cervical facet joint syndrome (L>R, C4/5/6/7), and Chronic pain syndrome were also pertinent to this visit.  Plan of Care  I am having Hermila Muzzy. Nikolov "[name]" start on levorphanol and levorphanol. I am also having her maintain her glycopyrrolate, levothyroxine, loratadine, metoprolol succinate, pantoprazole, ondansetron, ibuprofen,  predniSONE, celecoxib, furosemide,  glycopyrrolate, metoprolol succinate, azelastine, vitamin B-12, cefdinir, azelastine, rOPINIRole, levothyroxine, levofloxacin, traZODone, gabapentin, buPROPion, citalopram, hydrOXYzine, and levorphanol.  Pharmacotherapy (Medications Ordered): Meds ordered this encounter  Medications  . levorphanol (LEVODROMORAN) 2 MG tablet    Sig: 3 mg in am, 2 mg in afternoon, and 2mg  evening For chronic pain, 30 day Rx    Dispense:  105 tablet    Refill:  0  . levorphanol (LEVODROMORAN) 2 MG tablet    Sig: 3 mg in am, 2 mg in afternoon, and 2mg  evening For chronic pain, 30 day Rx    Dispense:  105 tablet    Refill:  0  . levorphanol (LEVODROMORAN) 2 MG tablet    Sig: 3 mg in am, 2 mg in afternoon, and 2mg  evening For chronic pain, 30 day Rx    Dispense:  105 tablet    Refill:  0   Follow-up plan:   Return in about 3 months (around 07/18/2019) for Medication Management, in person, (UDS).    Recent Visits No visits were found meeting these conditions.  Showing recent visits within past 90 days and meeting all other requirements   Today's Visits Date Type Provider Dept  04/19/19 Office Visit [name], MD [facility]  Showing today's visits and meeting all other requirements   Future Appointments No visits were found meeting these conditions.  Showing future appointments within next 90 days and meeting all other requirements   I discussed the assessment and treatment plan with the patient. The patient was provided an opportunity to ask questions and all were answered. The patient agreed with the plan and demonstrated an understanding of the instructions.  Patient advised to call back or seek an in-person evaluation if the symptoms or condition worsens.  Total duration of non-face-to-face encounter: 77minutes.  Note by: [name], MD Date: 04/19/2019; Time: 9:13 AM

## 2019-04-29 ENCOUNTER — Inpatient Hospital Stay: Admit: 2019-04-29 | Payer: 59 | Admitting: Surgery

## 2019-04-29 SURGERY — ARTHROPLASTY, SHOULDER, TOTAL, REVERSE
Anesthesia: Choice | Site: Shoulder | Laterality: Right

## 2019-05-03 ENCOUNTER — Other Ambulatory Visit: Payer: Self-pay

## 2019-05-03 ENCOUNTER — Encounter: Payer: Self-pay | Admitting: Psychiatry

## 2019-05-03 ENCOUNTER — Ambulatory Visit (INDEPENDENT_AMBULATORY_CARE_PROVIDER_SITE_OTHER): Payer: 59 | Admitting: Psychiatry

## 2019-05-03 DIAGNOSIS — F411 Generalized anxiety disorder: Secondary | ICD-10-CM | POA: Diagnosis not present

## 2019-05-03 DIAGNOSIS — F3341 Major depressive disorder, recurrent, in partial remission: Secondary | ICD-10-CM

## 2019-05-03 DIAGNOSIS — F5105 Insomnia due to other mental disorder: Secondary | ICD-10-CM | POA: Diagnosis not present

## 2019-05-03 DIAGNOSIS — R413 Other amnesia: Secondary | ICD-10-CM

## 2019-05-03 NOTE — Progress Notes (Signed)
Virtual Visit via Telephone Note  I connected with Connie [name] on 05/03/19 at 10:00 AM EST by telephone and verified that I am speaking with the correct person using two identifiers.   I discussed the limitations, risks, security and privacy concerns of performing an evaluation and management service by telephone and the availability of in person appointments. I also discussed with the patient that there may be a patient responsible charge related to this service. The patient expressed understanding and agreed to proceed.    I discussed the assessment and treatment plan with the patient. The patient was provided an opportunity to ask questions and all were answered. The patient agreed with the plan and demonstrated an understanding of the instructions.   The patient was advised to call back or seek an in-person evaluation if the symptoms worsen or if the condition fails to improve as anticipated.   [place] MD OP Progress Note  05/03/2019 12:26 PM [name]  MRN:  [id number]  Chief Complaint:  Chief Complaint    Follow-up     HPI: Connie [name] is a 64 year old Caucasian female, married, on SSD, lives in [place], has a history of depression, anxiety disorder, history of delusional parasitosis, chronic pain on opioid medications, fibromyalgia, hypothyroidism, hypertension, polyarthralgia was evaluated by phone today.  Patient preferred to do a phone call.  Patient today reports she is currently doing well with regards to her mood symptoms.  She denies any significant depression or anxiety symptoms.  She reports sleep is good.  Patient reports her pain is under better control at this time.  She continues to follow-up with her pain management.  Patient denies any suicidality, homicidality or perceptual disturbances.  Patient does report that she has worsening tremors as well as muscle spasms.  She however reports she does not think her medications are contributing to it rather her medical  problems could be making it worse.  She reports she has upcoming appointment with neurology for evaluation.  She looks forward to that.  Patient currently denies any other concerns today. Visit Diagnosis:    ICD-10-CM   1. MDD (major depressive disorder), recurrent, in partial remission ([place])  F33.41   2. GAD (generalized anxiety disorder)  F41.1   3. Insomnia due to mental disorder  F51.05   4. Memory loss  R41.3     Past Psychiatric History: I have reviewed past psychiatric history from my progress note on 08/06/2017.  Past trials of Latuda, Wellbutrin, Cymbalta  Past Medical History:  Past Medical History:  Diagnosis Date  . Anemia    in the past  . Anxiety   . Arthritis    all over  . Cervical spondylosis with myelopathy 2018  . Chronic pain 2019   can't stand straight, uses walker for stability  . DDD (degenerative disc disease), lumbar   . Depression   . Displacement of cervical intervertebral disc 2018  . Dyspnea    HANDICAPPED  . Fibromyalgia    Polymyalgia Rheumatica  . GERD (gastroesophageal reflux disease)   . Heart murmur    not treated or being followed  . Hypertension    CONTROLLED  . Hypothyroidism   . Lumbar post-laminectomy syndrome 2018  . Lumbosacral radiculitis 2018  . Neuropathy due to medical condition (HCC)    feet, toes, fingers  . Osteoarthritis   . Osteoporosis   . Other long term (current) drug therapy 2019   from pain management  . Pain in the coccyx 2018   disorder  of sacrum  . Spondylolisthesis 2018  . Spondylosis of lumbosacral region without myelopathy or radiculopathy 2018  . Spondylosis with myelopathy, lumbar region 2018  . Vitamin D deficiency     Past Surgical History:  Procedure Laterality Date  . ABDOMINAL HYSTERECTOMY  2000  . APPENDECTOMY    . arm surgery  2002  . BREAST BIOPSY Left 2005   benign  . CATARACT EXTRACTION W/PHACO Right 10/21/2017   Procedure: CATARACT EXTRACTION PHACO AND INTRAOCULAR LENS PLACEMENT ([place])  RIGHT;  Surgeon: Leandrew Koyanagi, MD;  Location: [facility];  Service: Ophthalmology;  Laterality: Right;  neck issues  . CESAREAN SECTION     x 2  . CHOLECYSTECTOMY    . COLONOSCOPY WITH PROPOFOL N/A 08/25/2017   Procedure: COLONOSCOPY WITH PROPOFOL;  Surgeon: Toledo, Benay Pike, MD;  Location: [facility];  Service: Gastroenterology;  Laterality: N/A;  . epidural injection  2018   steroids  . GASTRIC BYPASS  2004  . JOINT REPLACEMENT Bilateral 2005   knees  . LUMBAR FUSION  2016   x 2/ L3-5  . SHOULDER ARTHROSCOPY WITH ROTATOR CUFF REPAIR Right 2008   x 3  . SPINAL CORD STIMULATOR IMPLANT  2018   [facility] scientific  . TEMPORAL ARTERY BIOPSY / LIGATION    . TONSILLECTOMY      Family Psychiatric History: I have reviewed family psychiatric history from my progress note on 08/06/2017. Family History:  Family History  Problem Relation Age of Onset  . Kidney disease Mother   . Cancer Mother   . Anxiety disorder Mother   . Depression Mother   . Breast cancer Mother   . Cancer Father   . Hypertension Father   . Cancer Brother   . Breast cancer Maternal Aunt   . Breast cancer Cousin     Social History: Reviewed social history from my progress note on 08/06/2017. Social History   Socioeconomic History  . Marital status: Married    Spouse name: brady   . Number of children: 2  . Years of education: Not on file  . Highest education level: High school graduate  Occupational History    Comment: disabled   Tobacco Use  . Smoking status: Former Smoker    Packs/day: 0.50    Years: 20.00    Pack years: 10.00    Types: Cigarettes    Quit date: 05/21/2007    Years since quitting: 11.9  . Smokeless tobacco: Never Used  Substance and Sexual Activity  . Alcohol use: No  . Drug use: No    Comment: managed by pain clinic  . Sexual activity: Not Currently  Other Topics Concern  . Not on file  Social History Narrative  . Not on file   Social Determinants of Health    Financial Resource Strain:   . Difficulty of Paying Living Expenses: Not on file  Food Insecurity:   . Worried About Charity fundraiser in the Last Year: Not on file  . Ran Out of Food in the Last Year: Not on file  Transportation Needs:   . Lack of Transportation (Medical): Not on file  . Lack of Transportation (Non-Medical): Not on file  Physical Activity:   . Days of Exercise per Week: Not on file  . Minutes of Exercise per Session: Not on file  Stress:   . Feeling of Stress : Not on file  Social Connections:   . Frequency of Communication with Friends and Family: Not on file  .  Frequency of Social Gatherings with Friends and Family: Not on file  . Attends Religious Services: Not on file  . Active Member of Clubs or Organizations: Not on file  . Attends Archivist Meetings: Not on file  . Marital Status: Not on file    Allergies:  Allergies  Allergen Reactions  . Tape Rash    Skin peels off and blisters.  Probably was clear plastic tape.    Metabolic Disorder Labs: No results found for: HGBA1C, MPG No results found for: PROLACTIN No results found for: CHOL, TRIG, HDL, CHOLHDL, VLDL, LDLCALC No results found for: TSH  Therapeutic Level Labs: No results found for: LITHIUM No results found for: VALPROATE No components found for:  CBMZ  Current Medications: Current Outpatient Medications  Medication Sig Dispense Refill  . azelastine (ASTELIN) 0.1 % nasal spray Place into the nose.    Marland Kitchen azelastine (ASTELIN) 0.1 % nasal spray U 1 SPR IEN BID    . buPROPion (WELLBUTRIN SR) 200 MG 12 hr tablet TAKE 1 TABLET(200 MG) BY MOUTH TWICE DAILY 60 tablet 3  . cefdinir (OMNICEF) 300 MG capsule Take 300 mg by mouth daily.     . celecoxib (CELEBREX) 200 MG capsule Take 200 mg by mouth 2 (two) times daily. AM    . citalopram (CELEXA) 20 MG tablet TAKE 1 TABLET(20 MG) BY MOUTH DAILY 90 tablet 0  . esomeprazole (NEXIUM) 40 MG capsule esomeprazole magnesium 40 mg  capsule,delayed release    . furosemide (LASIX) 20 MG tablet Take 20 mg by mouth as needed.    . gabapentin (NEURONTIN) 300 MG capsule Take 1 capsule (300 mg total) by mouth 2 (two) times daily. 60 capsule 5  . glycopyrrolate (ROBINUL) 1 MG tablet Take 1 mg by mouth 2 (two) times daily. AM AND HS  3  . glycopyrrolate (ROBINUL) 1 MG tablet TAKE 1 TABLET(1 MG) BY MOUTH TWICE DAILY    . hydrOXYzine (VISTARIL) 25 MG capsule TAKE ONE CAPSULE BY MOUTH THREE TIMES DAILY AS NEEDED FOR SEVERE ANXIETY AS WELL AS FOR SLEEP 90 capsule 2  . levorphanol (LEVODROMORAN) 2 MG tablet 3 mg in am, 2 mg in afternoon, and 2mg  evening For chronic pain, 30 day Rx 105 tablet 0  . [START ON 05/20/2019] levorphanol (LEVODROMORAN) 2 MG tablet 3 mg in am, 2 mg in afternoon, and 2mg  evening For chronic pain, 30 day Rx 105 tablet 0  . [START ON 06/19/2019] levorphanol (LEVODROMORAN) 2 MG tablet 3 mg in am, 2 mg in afternoon, and 2mg  evening For chronic pain, 30 day Rx 105 tablet 0  . levothyroxine (SYNTHROID) 50 MCG tablet     . levothyroxine (SYNTHROID, LEVOTHROID) 25 MCG tablet 50 mcg.   0  . loratadine (CLARITIN) 10 MG tablet Take 10 mg by mouth daily as needed for allergies.    . metoprolol succinate (TOPROL-XL) 100 MG 24 hr tablet Take 100 mg by mouth once daily in the evening  0  . metoprolol succinate (TOPROL-XL) 100 MG 24 hr tablet Take by mouth.    . ondansetron (ZOFRAN-ODT) 8 MG disintegrating tablet Take 8 mg by mouth daily as needed for nausea or vomiting.    . pantoprazole (PROTONIX) 40 MG tablet Take 40 mg by mouth once daily in the evening    . rOPINIRole (REQUIP) 0.5 MG tablet Take by mouth.    . traZODone (DESYREL) 100 MG tablet Take 1 tablet (100 mg total) by mouth at bedtime as needed for sleep. [place]  tablet 0  . vitamin B-12 (CYANOCOBALAMIN) 1000 MCG tablet Take by mouth.    Marland Kitchen ibuprofen (ADVIL,MOTRIN) 200 MG tablet Take 400 mg by mouth daily as needed for headache or moderate pain.    Marland Kitchen levofloxacin (LEVAQUIN)  500 MG tablet     . predniSONE (DELTASONE) 5 MG tablet Take 4 mg by mouth daily.     No current facility-administered medications for this visit.     Musculoskeletal: Strength & Muscle Tone: UTA Gait & Station: Walks with walker Patient leans: N/A  Psychiatric Specialty Exam: Review of Systems  Musculoskeletal: Positive for myalgias.  Neurological: Positive for tremors.  Psychiatric/Behavioral: Negative for agitation, behavioral problems, confusion, decreased concentration, dysphoric mood, hallucinations, self-injury, sleep disturbance and suicidal ideas. The patient is not nervous/anxious and is not hyperactive.   All other systems reviewed and are negative.   There were no vitals taken for this visit.There is no height or weight on file to calculate BMI.  General Appearance: UTA  Eye Contact:  UTA  Speech:  Clear and Coherent  Volume:  Normal  Mood:  Euthymic  Affect:  UTA  Thought Process:  Goal Directed and Descriptions of Associations: Intact  Orientation:  Full (Time, Place, and Person)  Thought Content: Logical   Suicidal Thoughts:  No  Homicidal Thoughts:  No  Memory:  Immediate;   Fair Recent;   Fair Remote;   Fair  Judgement:  Fair  Insight:  Fair  Psychomotor Activity:  UTA  Concentration:  Concentration: Fair and Attention Span: Fair  Recall:  [employer] of Knowledge: Fair  Language: Fair  Akathisia:  No  Handed:  Right  AIMS (if indicated): does have tremors - spasms - reports its due to her medical issues and not due to her medications   Assets:  Communication Skills Desire for Improvement Housing Social Support  ADL's:  Intact  Cognition: WNL  Sleep:  Fair   Screenings: PHQ2-9     Office Visit from 07/13/2018 in [facility] from 04/15/2018 in [facility] Clinical Support from 01/27/2018 in [facility] from 11/05/2017 in [facility] Procedure visit from 08/19/2017 in [facility]  PHQ-2 Total Score  0  0  0  0  0       Assessment and Plan: Connie [name] is a 64 year old Caucasian female who has a history of depression, chronic pain, GERD, hypothyroidism, hypertension, fibromyalgia was evaluated by phone today.  Patient with psychosocial stressors of her health issues, chronic pain, currently is making progress with regards to her mood symptoms.  Plan as noted below.  Plan MDD-in partial remission Wellbutrin SR 200 mg p.o. twice daily Celexa 20 mg p.o. daily   Anxiety disorder-stable Celexa as prescribed Hydroxyzine 25 mg p.o. 3 times daily as needed  Insomnia-stable Trazodone 200 mg p.o. nightly as needed-reduced dosage Hydroxyzine as prescribed  Memory problems-unstable Patient has upcoming appointment with neurology.  Tremors-unstable-patient referred to neurology.  Follow-up in clinic in 3 months or sooner if needed. March 30 , 4:40 pm  I have spent atleast 20 minutes non face to face with patient today. More than 50 % of the time was spent for psychoeducation and supportive psychotherapy and care coordination. This note was generated in part or whole with voice recognition software. Voice recognition is usually quite accurate but there are transcription errors  that can and very often do occur. I apologize for any typographical errors that were not detected and corrected.       Ursula Alert, MD 05/03/2019, 12:26 PM

## 2019-06-09 LAB — TOXASSURE SELECT 13 (MW), URINE

## 2019-06-14 ENCOUNTER — Other Ambulatory Visit: Payer: Self-pay | Admitting: Neurology

## 2019-06-14 DIAGNOSIS — G253 Myoclonus: Secondary | ICD-10-CM

## 2019-06-20 ENCOUNTER — Telehealth: Payer: Self-pay | Admitting: Student in an Organized Health Care Education/Training Program

## 2019-06-20 NOTE — Telephone Encounter (Signed)
Patient called stating she is out of her medications. I added to Tues. Schedule at 8:45. She will need Nurse call to update for appt. Thank you

## 2019-06-20 NOTE — Telephone Encounter (Signed)
Please disregard this msg. Patient had told me she already contacted the pharmacy and was told they did not have script. She called after I spoke with her and was told they did have one to fill.

## 2019-06-21 ENCOUNTER — Ambulatory Visit: Payer: 59 | Admitting: Student in an Organized Health Care Education/Training Program

## 2019-06-22 ENCOUNTER — Ambulatory Visit
Admission: RE | Admit: 2019-06-22 | Discharge: 2019-06-22 | Disposition: A | Discharge status: Home / Self Care | Payer: 59 | Source: Ambulatory Visit | Attending: Neurology | Admitting: Neurology

## 2019-06-22 ENCOUNTER — Other Ambulatory Visit: Payer: Self-pay

## 2019-06-22 DIAGNOSIS — G253 Myoclonus: Secondary | ICD-10-CM | POA: Diagnosis not present

## 2019-06-23 ENCOUNTER — Other Ambulatory Visit: Payer: Self-pay | Admitting: Psychiatry

## 2019-06-23 DIAGNOSIS — F5105 Insomnia due to other mental disorder: Secondary | ICD-10-CM

## 2019-06-23 DIAGNOSIS — F331 Major depressive disorder, recurrent, moderate: Secondary | ICD-10-CM

## 2019-07-05 ENCOUNTER — Encounter: Payer: Self-pay | Admitting: Student in an Organized Health Care Education/Training Program

## 2019-07-05 ENCOUNTER — Ambulatory Visit
Payer: 59 | Attending: Student in an Organized Health Care Education/Training Program | Admitting: Student in an Organized Health Care Education/Training Program

## 2019-07-05 ENCOUNTER — Ambulatory Visit: Payer: 59 | Admitting: Student in an Organized Health Care Education/Training Program

## 2019-07-05 ENCOUNTER — Other Ambulatory Visit: Payer: Self-pay

## 2019-07-05 VITALS — Ht 60.0 in | Wt 199.0 lb

## 2019-07-05 DIAGNOSIS — M5030 Other cervical disc degeneration, unspecified cervical region: Secondary | ICD-10-CM

## 2019-07-05 DIAGNOSIS — M4726 Other spondylosis with radiculopathy, lumbar region: Secondary | ICD-10-CM

## 2019-07-05 DIAGNOSIS — G894 Chronic pain syndrome: Secondary | ICD-10-CM

## 2019-07-05 DIAGNOSIS — Z981 Arthrodesis status: Secondary | ICD-10-CM

## 2019-07-05 DIAGNOSIS — Z9689 Presence of other specified functional implants: Secondary | ICD-10-CM | POA: Diagnosis not present

## 2019-07-05 DIAGNOSIS — M47816 Spondylosis without myelopathy or radiculopathy, lumbar region: Secondary | ICD-10-CM

## 2019-07-05 DIAGNOSIS — M5416 Radiculopathy, lumbar region: Secondary | ICD-10-CM

## 2019-07-05 DIAGNOSIS — M47812 Spondylosis without myelopathy or radiculopathy, cervical region: Secondary | ICD-10-CM

## 2019-07-05 DIAGNOSIS — M4807 Spinal stenosis, lumbosacral region: Secondary | ICD-10-CM

## 2019-07-05 MED ORDER — LEVORPHANOL TARTRATE 2 MG PO TABS: ORAL_TABLET | ORAL | 0 refills | Status: AC

## 2019-07-05 MED ORDER — LEVORPHANOL TARTRATE 2 MG PO TABS: ORAL_TABLET | ORAL | 0 refills | Status: DC

## 2019-07-05 NOTE — Progress Notes (Signed)
Patient: Connie [name]  Service Category: E/M  Provider: [name], MD  DOB: 03/15/1956  DOS: 07/05/2019  Location: Office  MRN: [id number]  Setting: Ambulatory outpatient  Referring Provider: Tracie Harrier, MD  Type: Established Patient  Specialty: Interventional Pain Management  PCP: Connie Harrier, MD  Location: Home  Delivery: TeleHealth     Virtual Encounter - Pain Management PROVIDER NOTE: Information contained herein reflects review and annotations entered in association with encounter. Interpretation of such information and data should be left to medically-trained personnel. Information provided to patient can be located elsewhere in the medical record under "Patient Instructions". Document created using STT-dictation technology, any transcriptional errors that may result from process are unintentional.    Contact & Pharmacy Preferred: [phone] Home: [phone] (home) Mobile: [phone] (mobile) E-mail: [email or web address]  [facility] [place], [address] [place] [address] Phone: [phone] Fax: [phone]   Pre-screening  Connie [name] offered "in-person" vs "virtual" encounter. She indicated preferring virtual for this encounter.   Reason COVID-19*  Social distancing based on CDC and AMA recommendations.   I contacted Connie [name] on 07/05/2019 via telephone.      I clearly identified myself as Connie Santa, MD. I verified that I was speaking with the correct person using two identifiers (Name: Connie [name], and date of birth: 08/10/1955).  This visit was completed via telephone due to the restrictions of the COVID-19 pandemic. All issues as above were discussed and addressed but no physical exam was performed. If it was felt that the patient should be evaluated in the office, they were directed there. The patient verbally consented to this visit. Patient was unable to  complete an audio/visual visit due to Technical difficulties and/or Lack of internet. Due to the catastrophic nature of the COVID-19 pandemic, this visit was done through audio contact only.  Location of the patient: home address (see Epic for details)  Location of the provider: office  Consent I sought verbal advanced consent from Connie [name] for virtual visit interactions. I informed Connie [name] of possible security and privacy concerns, risks, and limitations associated with providing "not-in-person" medical evaluation and management services. I also informed Connie [name] of the availability of "in-person" appointments. Finally, I informed her that there would be a charge for the virtual visit and that she could be  personally, fully or partially, financially responsible for it. Connie [name] expressed understanding and agreed to proceed.   Historic Elements   Connie [name] is a 64 y.o. year old, female patient evaluated today after her last contact with our practice on 06/20/2019. Connie [name]  has a past medical history of Anemia, Anxiety, Arthritis, Cervical spondylosis with myelopathy (2018), Chronic pain (2019), DDD (degenerative disc disease), lumbar, Depression, Displacement of cervical intervertebral disc (2018), Dyspnea, Fibromyalgia, GERD (gastroesophageal reflux disease), Heart murmur, Hypertension, Hypothyroidism, Lumbar post-laminectomy syndrome (2018), Lumbosacral radiculitis (2018), Neuropathy due to medical condition ([place]), Osteoarthritis, Osteoporosis, Other long term (current) drug therapy (2019), Pain in the coccyx (2018), Spondylolisthesis (2018), Spondylosis of lumbosacral region without myelopathy or radiculopathy (2018), Spondylosis with myelopathy, lumbar region (2018), and Vitamin D deficiency. She also  has a past surgical history that includes Cesarean section; Gastric bypass (2004); Lumbar fusion (2016); Spinal cord stimulator implant (2018); Cholecystectomy;  Appendectomy; Tonsillectomy; Shoulder arthroscopy with rotator cuff repair (Right, 2008); Joint replacement (Bilateral, 2005); Abdominal hysterectomy (2000); epidural injection (2018); arm surgery (  2002); Breast biopsy (Left, 2005); Colonoscopy with propofol (N/A, 08/25/2017); Temporal artery biopsy / ligation; and Cataract extraction w/PHACO (Right, 10/21/2017). Connie [name] has a current medication list which includes the following prescription(s): azelastine, bupropion, celecoxib, citalopram, gabapentin, ibuprofen, levothyroxine, loratadine, metoprolol succinate, ondansetron, pantoprazole, trazodone, vitamin b-12, azelastine, cefdinir, esomeprazole, furosemide, glycopyrrolate, glycopyrrolate, hydroxyzine, levofloxacin, [START ON 07/20/2019] levorphanol, [START ON 08/19/2019] levorphanol, [START ON 09/18/2019] levorphanol, levothyroxine, metoprolol succinate, prednisone, and ropinirole. She  reports that she quit smoking about 12 years ago. Her smoking use included cigarettes. She has a 10.00 pack-year smoking history. She has never used smokeless tobacco. She reports that she does not drink alcohol or use drugs. Connie [name] is allergic to tape.   HPI  Today, she is being contacted for medication management.   Lumbar radicular pain flare- left side,radiates to ankle/foot.  gets better with rest and supine position. Started approx 1 week ago, no inciting event.  Continues medications as prescribed Difficulty with stool control per patient Is utilizing walker to ambulate  Last imaging of CT spine 07/24/2017:   1. Spinal cord stimulator at T8-9. 2. Transitional S1 anatomy. 3. Prominent right paramedian disc protrusion at T9-10. 4. Moderate foraminal stenosis at L1-2 and L2-3 is worse on the right. 5. Adequate decompression of the central canal at L3-4 and L4-5. 6. Despite posterior decompression of the spinal canal at L4-5. Grade 3 anterolisthesis results in some impact on the ventral nerve roots, right  greater than left. 7. The moderate foraminal stenosis bilaterally at L5-S1 is worse on the right.  Recommend repeat CT lumbars spine with and without contrast   Pharmacotherapy Assessment  Analgesic: 06/20/2019  2   04/19/2019  Levorphanol 2 MG Tablet  105.00  30 Bi Lat   [id number]   Wal ([id number])   0  77.00 MME  Comm Ins   [place]     Monitoring: [place] PMP: PDMP reviewed during this encounter.       Pharmacotherapy: No side-effects or adverse reactions reported. Compliance: No problems identified. Effectiveness: Clinically acceptable. Plan: Refer to "POC".  UDS:  Summary  Date Value Ref Range Status  06/07/2019 Note  Final    Comment:    ==================================================================== [employer] Select 13 (MW) ==================================================================== Test                             Result       Flag       Units   NO DRUGS DETECTED. ==================================================================== Test                      Result    Flag   Units      Ref Range   Creatinine              84               mg/dL      >=20 ==================================================================== Declared Medications:  The flagging and interpretation on this report are based on the  following declared medications.  Unexpected results may arise from  inaccuracies in the declared medications.  **Note: The testing scope of this panel does not include the  following reported medications:  Azelastine  Bupropion  Cefdinir  Celecoxib  Citalopram  Cyanocobalamin  Esomeprazole (Nexium)  Furosemide  Gabapentin  Glycopyrrolate  Hydroxyzine  Ibuprofen  Levofloxacin  Levorphanol  Levothyroxine  Loratadine  Metoprolol  Ondansetron  Pantoprazole  Prednisone  Ropinirole  Trazodone ==================================================================== For  clinical consultation, please call [phone]. ====================================================================    Laboratory Chemistry Profile   Renal Lab Results  Component Value Date   BUN 18 06/23/2017   CREATININE 0.60 06/23/2017   GFRAA >60 06/23/2017   GFRNONAA >60 06/23/2017    Hepatic Lab Results  Component Value Date   AST 20 06/23/2017   ALT 9 (L) 06/23/2017   ALBUMIN 3.6 06/23/2017   ALKPHOS 88 06/23/2017    Electrolytes Lab Results  Component Value Date   NA 135 06/23/2017   K 4.9 06/23/2017   CL 102 06/23/2017   CALCIUM 8.5 (L) 06/23/2017    Bone No results found for: VD25OH, VD125OH2TOT, WH6759FM3, WG6659DJ5, 25OHVITD1, 25OHVITD2, 25OHVITD3, TESTOFREE, TESTOSTERONE  Inflammation (CRP: Acute Phase) (ESR: Chronic Phase) No results found for: CRP, ESRSEDRATE, LATICACIDVEN    Note: Above Lab results reviewed.  Imaging  CT HEAD WO CONTRAST CLINICAL DATA:  Balance issues and repetitive motion.  EXAM: CT HEAD WITHOUT CONTRAST  TECHNIQUE: Contiguous axial images were obtained from the base of the skull through the vertex without intravenous contrast.  COMPARISON:  None.  FINDINGS: Brain: No evidence of acute infarction, hemorrhage, hydrocephalus, extra-axial collection or mass lesion/mass effect. Brain parenchyma has normal attenuation characteristics.  Vascular: No hyperdense vessel or unexpected calcification.  Skull: Normal. Negative for fracture or focal lesion.  Sinuses/Orbits: No acute finding.  Other: None.  IMPRESSION: Normal head CT.  Electronically Signed   By: Pedro Earls M.D.   On: 06/23/2019 10:17  Assessment  The primary encounter diagnosis was Lumbar radiculopathy. Diagnoses of History of lumbar fusion, Lumbar spondylosis, Spinal cord stimulator status, DDD (degenerative disc disease), cervical, Spondylosis of cervical region without myelopathy or radiculopathy, Spinal stenosis of lumbosacral region, and Chronic pain syndrome were also  pertinent to this visit.  Plan of Care  Ms. Lilyan Prete Breth has a current medication list which includes the following long-term medication(s): azelastine, bupropion, citalopram, gabapentin, levothyroxine, loratadine, metoprolol succinate, pantoprazole, trazodone, azelastine, esomeprazole, glycopyrrolate, glycopyrrolate, levothyroxine, metoprolol succinate, and ropinirole.  Patient is experiencing severe left radicular pain flare.  This is impacting her ability to ambulate and is amplifying her pain.  Patient's previous CT of lumbar spine was over 2 years ago which showed moderate foraminal stenosis at L1-L2 and L2-L3 worse on the right with spinal cord decompression at L3-L4 L4-L5 with associated grade 3 anterolisthesis impacting the ventral nerve right greater than left with moderate foraminal stenosis bilaterally at L5-S1.  Given patient's current symptomatology, would like to obtain CT of lumbar spine with and without contrast.  Depending upon results we will discuss treatment plan which may include caudal epidural steroid injection.  I have encouraged the patient to continue utilizing her spinal cord stimulator.  We will refill her levorphanol as below.  I will call the patient to discuss her CT lumbar spine results after she has completed them.  Pharmacotherapy (Medications Ordered): Meds ordered this encounter  Medications  . levorphanol (LEVODROMORAN) 2 MG tablet    Sig: 3 mg in am, 2 mg in afternoon, and 16m evening For chronic pain, 30 day Rx    Dispense:  105 tablet    Refill:  0  . levorphanol (LEVODROMORAN) 2 MG tablet    Sig: 3 mg in am, 2 mg in afternoon, and 265mevening For chronic pain, 30 day Rx    Dispense:  105 tablet    Refill:  0  . levorphanol (LEVODROMORAN) 2 MG tablet    Sig: 3 mg in  am, 2 mg in afternoon, and 58m evening For chronic pain, 30 day Rx    Dispense:  105 tablet    Refill:  0   Orders:  Orders Placed This Encounter  Procedures  . CT LUMBAR SPINE W WO  CONTRAST    Patient presents with axial pain with possible radicular component.  In addition to any acute findings, please report on:  1. Facet (Zygapophyseal) joint DJD (Hypertrophy, space narrowing, subchondral sclerosis, and/or osteophyte formation) 2. DDD and/or IVDD (Loss of disc height, desiccation or "Black disc disease") 3. Pars defects 4. Spondylolisthesis, spondylosis, and/or spondyloarthropathies (include Degree/Grade of displacement in mm) 5. Vertebral body Fractures, including age (old, new/acute) 613 Modic Type Changes 7. Demineralization 8. Bone pathology 9. Central, Lateral Recess, and/or Foraminal Stenosis (include AP diameter of stenosis in mm) 10. Surgical changes (hardware type, status, and presence of fibrosis) NOTE: Please specify level(s) and laterality.    Standing Status:   Future    Standing Expiration Date:   10/05/2019    Order Specific Question:   If indicated for the ordered procedure, I authorize the administration of contrast media per Radiology protocol    Answer:   Yes    Order Specific Question:   Preferred imaging location?    Answer:   [facility]    Order Specific Question:   Call Results- Best Contact Number?    Answer:   [phone]([facility]    Order Specific Question:   Radiology Contrast Protocol - do NOT remove file path    Answer:   [email or web address]   Follow-up plan:   Return in about 3 months (around 10/05/2019) for Medication Management, in person.    Recent Visits Date Type Provider Dept  04/19/19 Office Visit LGillis Santa MD [facility]  Showing recent visits within past 90 days and meeting all other requirements   Today's Visits Date Type Provider Dept  07/05/19 Office Visit LGillis Santa MD [facility]  Showing today's visits and meeting all other requirements   Future Appointments No visits were found meeting these conditions.  Showing future appointments  within next 90 days and meeting all other requirements   I discussed the assessment and treatment plan with the patient. The patient was provided an opportunity to ask questions and all were answered. The patient agreed with the plan and demonstrated an understanding of the instructions.  Patient advised to call back or seek an in-person evaluation if the symptoms or condition worsens.  Duration of encounter: 25 minutes.  Note by: BGillis Santa MD Date: 07/05/2019; Time: 2:07 PM

## 2019-07-14 ENCOUNTER — Encounter: Payer: 59 | Admitting: Student in an Organized Health Care Education/Training Program

## 2019-07-26 ENCOUNTER — Other Ambulatory Visit: Payer: Self-pay

## 2019-07-26 ENCOUNTER — Encounter: Payer: Self-pay | Admitting: Psychiatry

## 2019-07-26 ENCOUNTER — Ambulatory Visit (INDEPENDENT_AMBULATORY_CARE_PROVIDER_SITE_OTHER): Payer: 59 | Admitting: Psychiatry

## 2019-07-26 DIAGNOSIS — R413 Other amnesia: Secondary | ICD-10-CM | POA: Diagnosis not present

## 2019-07-26 DIAGNOSIS — F331 Major depressive disorder, recurrent, moderate: Secondary | ICD-10-CM | POA: Diagnosis not present

## 2019-07-26 DIAGNOSIS — F5105 Insomnia due to other mental disorder: Secondary | ICD-10-CM | POA: Diagnosis not present

## 2019-07-26 DIAGNOSIS — F411 Generalized anxiety disorder: Secondary | ICD-10-CM

## 2019-07-26 MED ORDER — CITALOPRAM HYDROBROMIDE 20 MG PO TABS: 30.0000 mg | ORAL_TABLET | Freq: Every day | ORAL | 0 refills | Status: DC

## 2019-07-26 NOTE — Progress Notes (Signed)
Provider Location : [facility] Patient Location : Home  Virtual Visit via Video Note  I connected with Connie [name] on 07/26/19 at  4:40 PM EDT by a video enabled telemedicine application and verified that I am speaking with the correct person using two identifiers.   I discussed the limitations of evaluation and management by telemedicine and the availability of in person appointments. The patient expressed understanding and agreed to proceed.   I discussed the assessment and treatment plan with the patient. The patient was provided an opportunity to ask questions and all were answered. The patient agreed with the plan and demonstrated an understanding of the instructions.   The patient was advised to call back or seek an in-person evaluation if the symptoms worsen or if the condition fails to improve as anticipated.  [place] MD OP Progress Note  07/26/2019 5:36 PM Connie [name]  MRN:  [id number]  Chief Complaint:  Chief Complaint    Follow-up     HPI: Connie [name] is a 64 year old Caucasian female, married, on SSD, lives in [place], has a history of depression, anxiety disorder, insomnia, fibromyalgia, chronic pain on opioid medication, hypertension, hypothyroidism, polyarthralgia was evaluated by telemedicine today.  Patient today reports she is currently struggling with sciatica.  She is in severe pain all the time.  She reports it is difficult for her to walk to the bathroom due to the excruciating pain.  She stays in bed all day.  She has been taking pain medications however they are not helpful.  She is currently working with her pain provider as well as neurology.  She reports the pain is making her anxiety as well as depressive symptoms to be worse.  She reports she is compliant on her medications as prescribed.  Patient reports sleep is okay.  She does wake up due to the pain however she is able to fall back asleep.  Patient denies any suicidality, homicidality or perceptual  disturbances.  Patient denies any other concerns today. Visit Diagnosis:    ICD-10-CM   1. MDD (major depressive disorder), recurrent episode, moderate (HCC)  F33.1 citalopram (CELEXA) 20 MG tablet  2. GAD (generalized anxiety disorder)  F41.1   3. Insomnia due to mental disorder  F51.05 citalopram (CELEXA) 20 MG tablet   improving  4. Memory loss  R41.3     Past Psychiatric History: I have reviewed past psychiatric history from my progress note on 08/06/2017.  Past trials of Latuda, Wellbutrin, Cymbalta  Past Medical History:  Past Medical History:  Diagnosis Date  . Anemia    in the past  . Anxiety   . Arthritis    all over  . Cervical spondylosis with myelopathy 2018  . Chronic pain 2019   can't stand straight, uses walker for stability  . DDD (degenerative disc disease), lumbar   . Depression   . Displacement of cervical intervertebral disc 2018  . Dyspnea    HANDICAPPED  . Fibromyalgia    Polymyalgia Rheumatica  . GERD (gastroesophageal reflux disease)   . Heart murmur    not treated or being followed  . Hypertension    CONTROLLED  . Hypothyroidism   . Lumbar post-laminectomy syndrome 2018  . Lumbosacral radiculitis 2018  . Neuropathy due to medical condition (HCC)    feet, toes, fingers  . Osteoarthritis   . Osteoporosis   . Other long term (current) drug therapy 2019   from pain management  . Pain in the coccyx 2018   disorder of  sacrum  . Spondylolisthesis 2018  . Spondylosis of lumbosacral region without myelopathy or radiculopathy 2018  . Spondylosis with myelopathy, lumbar region 2018  . Vitamin D deficiency     Past Surgical History:  Procedure Laterality Date  . ABDOMINAL HYSTERECTOMY  2000  . APPENDECTOMY    . arm surgery  2002  . BREAST BIOPSY Left 2005   benign  . CATARACT EXTRACTION W/PHACO Right 10/21/2017   Procedure: CATARACT EXTRACTION PHACO AND INTRAOCULAR LENS PLACEMENT ([place]) RIGHT;  Surgeon: Leandrew Koyanagi, MD;  Location: [place];  Service: Ophthalmology;  Laterality: Right;  neck issues  . CESAREAN SECTION     x 2  . CHOLECYSTECTOMY    . COLONOSCOPY WITH PROPOFOL N/A 08/25/2017   Procedure: COLONOSCOPY WITH PROPOFOL;  Surgeon: Toledo, Benay Pike, MD;  Location: [facility];  Service: Gastroenterology;  Laterality: N/A;  . epidural injection  2018   steroids  . GASTRIC BYPASS  2004  . JOINT REPLACEMENT Bilateral 2005   knees  . LUMBAR FUSION  2016   x 2/ L3-5  . SHOULDER ARTHROSCOPY WITH ROTATOR CUFF REPAIR Right 2008   x 3  . SPINAL CORD STIMULATOR IMPLANT  2018   [facility] scientific  . TEMPORAL ARTERY BIOPSY / LIGATION    . TONSILLECTOMY      Family Psychiatric History: I have reviewed family psychiatric history from my progress note on 08/06/2017  Family History:  Family History  Problem Relation Age of Onset  . Kidney disease Mother   . Cancer Mother   . Anxiety disorder Mother   . Depression Mother   . Breast cancer Mother   . Cancer Father   . Hypertension Father   . Cancer Brother   . Breast cancer Maternal Aunt   . Breast cancer Cousin     Social History: I have reviewed social history from my progress note on 08/06/2017 Social History   Socioeconomic History  . Marital status: Married    Spouse name: brady   . Number of children: 2  . Years of education: Not on file  . Highest education level: High school graduate  Occupational History    Comment: disabled   Tobacco Use  . Smoking status: Former Smoker    Packs/day: 0.50    Years: 20.00    Pack years: 10.00    Types: Cigarettes    Quit date: 05/21/2007    Years since quitting: 12.1  . Smokeless tobacco: Never Used  Substance and Sexual Activity  . Alcohol use: No  . Drug use: No    Comment: managed by pain clinic  . Sexual activity: Not Currently  Other Topics Concern  . Not on file  Social History Narrative  . Not on file   Social Determinants of Health   Financial Resource Strain:   . Difficulty of  Paying Living Expenses:   Food Insecurity:   . Worried About Charity fundraiser in the Last Year:   . Arboriculturist in the Last Year:   Transportation Needs:   . Film/video editor (Medical):   Marland Kitchen Lack of Transportation (Non-Medical):   Physical Activity:   . Days of Exercise per Week:   . Minutes of Exercise per Session:   Stress:   . Feeling of Stress :   Social Connections:   . Frequency of Communication with Friends and Family:   . Frequency of Social Gatherings with Friends and Family:   . Attends Religious Services:   .  Active Member of Clubs or Organizations:   . Attends Archivist Meetings:   Marland Kitchen Marital Status:     Allergies:  Allergies  Allergen Reactions  . Tape Rash    Skin peels off and blisters.  Probably was clear plastic tape.    Metabolic Disorder Labs: No results found for: HGBA1C, MPG No results found for: PROLACTIN No results found for: CHOL, TRIG, HDL, CHOLHDL, VLDL, LDLCALC No results found for: TSH  Therapeutic Level Labs: No results found for: LITHIUM No results found for: VALPROATE No components found for:  CBMZ  Current Medications: Current Outpatient Medications  Medication Sig Dispense Refill  . azelastine (ASTELIN) 0.1 % nasal spray Place into the nose.    Marland Kitchen azelastine (ASTELIN) 0.1 % nasal spray U 1 SPR IEN BID    . buPROPion (WELLBUTRIN SR) 200 MG 12 hr tablet TAKE 1 TABLET(200 MG) BY MOUTH TWICE DAILY 60 tablet 3  . celecoxib (CELEBREX) 200 MG capsule Take 200 mg by mouth 2 (two) times daily. AM    . citalopram (CELEXA) 20 MG tablet Take 1.5 tablets (30 mg total) by mouth daily. 135 tablet 0  . esomeprazole (NEXIUM) 40 MG capsule esomeprazole magnesium 40 mg capsule,delayed release    . furosemide (LASIX) 20 MG tablet Take 20 mg by mouth as needed.    . gabapentin (NEURONTIN) 300 MG capsule Take 1 capsule (300 mg total) by mouth 2 (two) times daily. 60 capsule 5  . glycopyrrolate (ROBINUL) 1 MG tablet Take 1 mg by mouth 2  (two) times daily. AM AND HS  3  . glycopyrrolate (ROBINUL) 1 MG tablet TAKE 1 TABLET(1 MG) BY MOUTH TWICE DAILY    . hydrOXYzine (VISTARIL) 25 MG capsule TAKE ONE CAPSULE BY MOUTH THREE TIMES DAILY AS NEEDED FOR SEVERE ANXIETY AS WELL AS FOR SLEEP 90 capsule 2  . ibuprofen (ADVIL,MOTRIN) 200 MG tablet Take 400 mg by mouth daily as needed for headache or moderate pain.    Marland Kitchen levorphanol (LEVODROMORAN) 2 MG tablet 3 mg in am, 2 mg in afternoon, and 2mg  evening For chronic pain, 30 day Rx 105 tablet 0  . [START ON 08/19/2019] levorphanol (LEVODROMORAN) 2 MG tablet 3 mg in am, 2 mg in afternoon, and 2mg  evening For chronic pain, 30 day Rx 105 tablet 0  . [START ON 09/18/2019] levorphanol (LEVODROMORAN) 2 MG tablet 3 mg in am, 2 mg in afternoon, and 2mg  evening For chronic pain, 30 day Rx 105 tablet 0  . levothyroxine (SYNTHROID) 50 MCG tablet     . loratadine (CLARITIN) 10 MG tablet Take 10 mg by mouth daily as needed for allergies.    . metoprolol succinate (TOPROL-XL) 100 MG 24 hr tablet Take 100 mg by mouth once daily in the evening  0  . ondansetron (ZOFRAN-ODT) 8 MG disintegrating tablet Take 8 mg by mouth daily as needed for nausea or vomiting.    . pantoprazole (PROTONIX) 40 MG tablet TAKE 1 TABLET(40 MG) BY MOUTH EVERY DAY    . rOPINIRole (REQUIP) 0.5 MG tablet Take by mouth.    . traZODone (DESYREL) 100 MG tablet TAKE 1 TABLET(100 MG) BY MOUTH AT BEDTIME AS NEEDED FOR SLEEP 90 tablet 0  . vitamin B-12 (CYANOCOBALAMIN) 1000 MCG tablet Take by mouth.    . cefdinir (OMNICEF) 300 MG capsule Take 300 mg by mouth daily.     Marland Kitchen levofloxacin (LEVAQUIN) 500 MG tablet     . predniSONE (DELTASONE) 5 MG tablet Take 4 mg  by mouth daily.     No current facility-administered medications for this visit.     Musculoskeletal: Strength & Muscle Tone: UTA Gait & Station: Walks with walker Patient leans: N/A  Psychiatric Specialty Exam: Review of Systems  Musculoskeletal: Positive for arthralgias, back  pain and myalgias.       Sciatica  Psychiatric/Behavioral: Positive for dysphoric mood. The patient is nervous/anxious.   All other systems reviewed and are negative.   There were no vitals taken for this visit.There is no height or weight on file to calculate BMI.  General Appearance: Casual  Eye Contact:  Fair  Speech:  Clear and Coherent  Volume:  Normal  Mood:  Anxious and Dysphoric  Affect:  Congruent  Thought Process:  Goal Directed and Descriptions of Associations: Intact  Orientation:  Full (Time, Place, and Person)  Thought Content: Logical   Suicidal Thoughts:  No  Homicidal Thoughts:  No  Memory:  Immediate;   Fair Recent;   Fair Remote;   Fair does report memory problems  Judgement:  Fair  Insight:  Fair  Psychomotor Activity:  Normal  Concentration:  Concentration: Fair and Attention Span: Fair  Recall:  [employer] of Knowledge: Fair  Language: Fair  Akathisia:  No  Handed:  Right  AIMS (if indicated): UTA  Assets:  Communication Skills Desire for Improvement Housing Social Support  ADL's:  Intact  Cognition: WNL  Sleep:  Fair   Screenings: PHQ2-9     Office Visit from 07/13/2018 in [place] [facility] Clinical Support from 04/15/2018 in [place] Clinical Support from 01/27/2018 in [facility] Clinical Support from 11/05/2017 in [place] Procedure visit from 08/19/2017 in [facility]  PHQ-2 Total Score  0  0  0  0  0       Assessment and Plan: Connie [name] is a 63 year old Caucasian female who has a history of depression, chronic pain, GERD, hypothyroidism, hypertension, fibromyalgia was evaluated by telemedicine today.  Patient with psychosocial stressors of her health issues, her sciatica is currently affecting her mood.  She will benefit from the  following plan.  Plan MDD-unstable Wellbutrin SR 200 mg p.o. twice daily Will increase Celexa to 30 mg p.o. daily  Generalized anxiety disorder-unstable Celexa as prescribed Hydroxyzine 25 mg p.o. 3 times daily as needed  Insomnia-stable Trazodone 200 mg p.o. nightly as needed-reduced dosage Hydroxyzine as prescribed  Memory problems-unstable Patient reports she continues to struggle with memory issues, has word finding difficulty.  Her neurologist is currently working her up for the same. She also has tremors and will continue to follow-up with neurology for the same.  Follow-up in clinic in 4 weeks or sooner if needed.  I have spent atleast 20 minutes non face to face with patient today. More than 50 % of the time was spent for preparing to see the patient ( e.g., review of test, records ), obtaining and to review and separately obtained history , ordering medications and test ,psychoeducation and supportive psychotherapy and care coordination,as well as documenting clinical information in electronic health record. This note was generated in part or whole with voice recognition software. Voice recognition is usually quite accurate but there are transcription errors that can and very often do occur. I apologize for any typographical errors that were not detected and corrected.  Ursula Alert, MD 07/26/2019, 5:36 PM

## 2019-08-01 ENCOUNTER — Ambulatory Visit: Payer: Managed Care, Other (non HMO)

## 2019-08-03 ENCOUNTER — Ambulatory Visit: Payer: Managed Care, Other (non HMO) | Admitting: Speech Pathology

## 2019-08-09 ENCOUNTER — Ambulatory Visit: Payer: Managed Care, Other (non HMO) | Attending: Neurology | Admitting: Speech Pathology

## 2019-08-09 DIAGNOSIS — R41841 Cognitive communication deficit: Secondary | ICD-10-CM | POA: Insufficient documentation

## 2019-08-15 ENCOUNTER — Ambulatory Visit: Payer: Managed Care, Other (non HMO) | Admitting: Speech Pathology

## 2019-08-15 ENCOUNTER — Other Ambulatory Visit: Payer: Self-pay

## 2019-08-15 DIAGNOSIS — R41841 Cognitive communication deficit: Secondary | ICD-10-CM | POA: Diagnosis not present

## 2019-08-16 ENCOUNTER — Encounter: Payer: Self-pay | Admitting: Speech Pathology

## 2019-08-16 NOTE — Therapy (Signed)
[place] MAIN [facility] SERVICES [address] [place], [place], [address] Phone: [phone]   Fax:  [phone]  Speech Language Pathology Treatment  Patient Details  Name: Connie [name] MRN: [id number] Date of Birth: 21-Oct-1955 Referring Provider (SLP): Jennings Books MD   Encounter Date: 08/15/2019  End of Session - 08/16/19 0848    Visit Number  1    Number of Visits  13    Date for SLP Re-Evaluation  09/27/19    Authorization Type  [employer]    Authorization - Visit Number  1    Authorization - Number of Visits  10    SLP Start Time  [id number]    SLP Stop Time   1450    SLP Time Calculation (min)  30 min    Activity Tolerance  Patient tolerated treatment well       Past Medical History:  Diagnosis Date  . Anemia    in the past  . Anxiety   . Arthritis    all over  . Cervical spondylosis with myelopathy 2018  . Chronic pain 2019   can't stand straight, uses walker for stability  . DDD (degenerative disc disease), lumbar   . Depression   . Displacement of cervical intervertebral disc 2018  . Dyspnea    HANDICAPPED  . Fibromyalgia    Polymyalgia Rheumatica  . GERD (gastroesophageal reflux disease)   . Heart murmur    not treated or being followed  . Hypertension    CONTROLLED  . Hypothyroidism   . Lumbar post-laminectomy syndrome 2018  . Lumbosacral radiculitis 2018  . Neuropathy due to medical condition (HCC)    feet, toes, fingers  . Osteoarthritis   . Osteoporosis   . Other long term (current) drug therapy 2019   from pain management  . Pain in the coccyx 2018   disorder of sacrum  . Spondylolisthesis 2018  . Spondylosis of lumbosacral region without myelopathy or radiculopathy 2018  . Spondylosis with myelopathy, lumbar region 2018  . Vitamin D deficiency     Past Surgical History:  Procedure Laterality Date  . ABDOMINAL HYSTERECTOMY  2000  . APPENDECTOMY    . arm surgery  2002  . BREAST BIOPSY Left 2005   benign  . CATARACT EXTRACTION W/PHACO Right 10/21/2017   Procedure: CATARACT EXTRACTION PHACO AND INTRAOCULAR LENS PLACEMENT ([place]) RIGHT;  Surgeon: Leandrew Koyanagi, MD;  Location: [place];  Service: Ophthalmology;  Laterality: Right;  neck issues  . CESAREAN SECTION     x 2  . CHOLECYSTECTOMY    . COLONOSCOPY WITH PROPOFOL N/A 08/25/2017   Procedure: COLONOSCOPY WITH PROPOFOL;  Surgeon: Toledo, Benay Pike, MD;  Location: [facility];  Service: Gastroenterology;  Laterality: N/A;  . epidural injection  2018   steroids  . GASTRIC BYPASS  2004  . JOINT REPLACEMENT Bilateral 2005   knees  . LUMBAR FUSION  2016   x 2/ L3-5  . SHOULDER ARTHROSCOPY WITH ROTATOR CUFF REPAIR Right 2008   x 3  . SPINAL CORD STIMULATOR IMPLANT  2018   [facility] scientific  . TEMPORAL ARTERY BIOPSY / LIGATION    . TONSILLECTOMY      There were no vitals filed for this visit.     SLP Evaluation [facility] - 08/16/19 0001      SLP Visit Information   SLP Received On  08/15/19    Referring Provider (SLP)  Jennings Books MD    Onset Date  05/12/2018  Medical Diagnosis  cogntiive impairment      Subjective   Subjective  Patient was 14 minutes late due to getting lost.      General Information   HPI  Connie [name] is a 64 year old right handed female presenting with complains of short term memory loss and myoclonic movements.  Her CT scan as of 06/22/2019 is normal. Her medical history is significant for long-term use of opioids (since 2005) and depression.     Prior Functional Status   Cognitive/Linguistic Baseline  Within functional limits      Cognition   Overall Cognitive Status  Impaired/Different from baseline    Area of Impairment  Memory    Memory  Decreased short-term memory      Auditory Comprehension   Overall Auditory Comprehension  Appears within functional limits for tasks assessed      Visual Recognition/Discrimination   Discrimination  Within Function Limits       Expression   Primary Mode of Expression  Verbal      Verbal Expression   Overall Verbal Expression  Appears within functional limits for tasks assessed      Written Expression   Dominant Hand  Right      Oral Motor/Sensory Function   Overall Oral Motor/Sensory Function  Appears within functional limits for tasks assessed      Motor Speech   Overall Motor Speech  Appears within functional limits for tasks assessed      Standardized Assessments   Standardized Assessments   Montreal Cognitive Assessment (MOCA)       Montreal Cognitive Assessment (MOCA) Version: 8.1  Visuospatial/Executive Alternating trail making 1/1 Visuoconstruction Skills (copy 3-d design) 1/1 Draw a clock 2/3  Naming 3/3  Attention Forward digit span 1/1 Backward digit span 0/1 Vigilance 1/1 Serial 7's 2/3  Language Verbal Fluency 0/1 Repetition 1/2  Abstraction 2/2  Delayed Recall 1/5 Memory Index Score 9/15  Orientation 6/6  TOTAL 22/30  Normal ? 26/30     SLP Education - 08/16/19 0909    Education Details  The role of the SLP in cognitive communication disorders.    Person(s) Educated  Patient    Methods  Explanation    Comprehension  Verbalized understanding         SLP Long Term Goals - 08/16/19 0900      SLP LONG TERM GOAL #1   Title  Patient will identify cognitive-communication barriers and participate in developing functional compensatory strategies.    Time  6    Period  Weeks    Status  New    Target Date  09/27/19      SLP LONG TERM GOAL #2   Title  Patient will demonstrate oral/written comprehension for abstract/complex verbal/written information via grammatical, fluent, and cogent sentences to complete abstract/complex linguistic tasks with 80% accuracy.    Time  6    Period  Weeks    Status  New    Target Date  09/27/19      SLP LONG TERM GOAL #3   Title  Patient will complete complex auditory attention/vigilance/memory tasks with 80% accuracy.    Time   6    Period  Weeks    Status  New    Target Date  09/27/19      SLP LONG TERM GOAL #4   Title  The patient will utilize compensatory strategies to remember daily tasks with 80% accuracy given min cues from the clinician.    Time  6  Period  Weeks    Status  New    Target Date  09/27/19       Plan - 08/16/19 0850    Clinical Impression Statement Connie [name], who has a medical history significant for severe pain, long term opioid use and depression, is presenting with mild cognitive communication deficits characterized primarily by short term memory loss. Naming, reading, writing, visuospatial reasoning and attention are her relative strengths. She demonstrates impairment in the realms of memory and word fluency. Patient shared that her memory issues are interfering with her daily life, she has difficulty remembering to take medication, attend appointments, find locations of appointments, complete ADLs, etc. She has intact reading skills and can easily scan written information to compensate for short-term memory impairments. Her writing is functional but is negatively impacted by myoclonic jerks in her hands. Her speech is noted to be mildly slurred/slow with delayed initiation of responses to questions.  The patient will benefit from speech-language therapy for restorative and compensatory treatment of cognitive communication deficits.    Speech Therapy Frequency  2x / week    Duration  Other (comment)   6 weeks   Treatment/Interventions  Compensatory strategies;Functional tasks;Patient/family education;Cognitive reorganization;Compensatory techniques;Internal/external aids    Potential Considerations  Ability to learn/carryover information;Previous level of function;Co-morbidities    Consulted and Agree with Plan of Care  Patient       Patient will benefit from skilled therapeutic intervention in order to improve the following deficits and impairments:   Cognitive  communication deficit    Problem List Patient Active Problem List   Diagnosis Date Noted  . MDD (major depressive disorder), recurrent episode, moderate ([place]) 01/11/2019  . GAD (generalized anxiety disorder) 01/11/2019  . Bereavement 01/11/2019  . Opioid use with withdrawal ([place]) 01/11/2019  . Pain syndrome, chronic 09/21/2018  . Low serum vitamin B12 09/15/2018  . Moderate episode of recurrent major depressive disorder ([place]) 09/15/2018  . Memory loss 05/12/2018  . Chronic pain of both shoulders 05/12/2018  . Osteoporosis, post-menopausal 12/02/2017  . Vitamin D deficiency, unspecified 07/22/2017  . Candidiasis of skin and nails 07/13/2017  . Menopausal symptom 07/13/2017  . Bilateral hand pain 06/26/2017  . CRP elevated 06/26/2017  . Jaw pain, non-TMJ 06/26/2017  . Temporal headache 06/26/2017  . Stiffness of shoulder joint 06/26/2017  . Screening for osteoporosis 06/26/2017  . SOBOE (shortness of breath on exertion) 06/09/2017  . Joint pain 05/20/2017  . Arthritis 05/20/2017  . History of lumbar fusion 04/23/2017  . Lumbar degenerative disc disease 04/23/2017  . Fibromyalgia 04/23/2017  . Cervicalgia 04/23/2017  . DDD (degenerative disc disease), cervical 04/23/2017  . Chronic pain syndrome 04/23/2017  . Major depressive disorder 04/23/2017  . Spinal cord stimulator status 04/23/2017  . Complete tear of right rotator cuff 04/17/2017  . Rotator cuff tendinitis, right 04/17/2017  . Rotator cuff arthropathy of both shoulders 04/12/2017  . Degenerative joint disease of cervical and lumbar spine 03/17/2017  . Generalized osteoarthritis of multiple sites 03/17/2017  . Seasonal allergies 03/17/2017  . GERD without esophagitis 01/18/2017  . Fluid collection at surgical site 01/18/2017  . Hypothyroidism 01/18/2017  . Fatty liver 12/08/2016  . Osteoporosis 09/30/2016  . Pain in the coccyx 08/19/2016  . Delusions of parasitosis ([place]) 05/27/2016  . Essential hypertension  04/09/2016  . Chiari malformation type I ([place]) 03/24/2016  . Obesity, morbid ([place]) 03/24/2016  . Lumbosacral spondylosis without myelopathy 09/17/2015  . Chronic midline low back pain 03/28/2015  . Lumbosacral radiculitis  03/05/2015  . 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 Medication-induced delirium, acute, hyperactive 11/17/2014  . Fatigue 05/08/2014  . Lumbar post-laminectomy syndrome 05/08/2014  . Lumbar radiculopathy 05/08/2014  . Cervical radiculopathy 05/08/2014  . H/O gastric bypass 03/03/2014  . Dysphagia, oropharyngeal phase 05/18/2013  . Acquired spondylolisthesis 08/23/2012  . Nonunion of fracture 08/23/2012  . Spinal stenosis of lumbar region without neurogenic claudication 08/23/2012  . Chronic constipation 07/26/2012  . Dry eyes, bilateral 07/26/2012  . Leg cramps 07/26/2012  . Diastolic dysfunction 0000000  . Daytime somnolence 06/02/2012  . Insomnia due to mental disorder 05/11/2012  . Lumbar spondylosis 04/06/2012  . Lumbar pseudoarthrosis 04/06/2012  . Iron deficiency 01/14/2012  . Adiposis dolorosa 12/16/2011  . Chronic pain 12/16/2011  . Edema 12/16/2011  . Internal hemorrhoid 12/16/2011  . OA (osteoarthritis) 12/16/2011    Javyon Fontan 08/16/2019, 9:09 AM  [place] MAIN [facility] SERVICES [address] [place], [place], [address] Phone: [phone]   Fax:  [phone]   Name: Connie [name] MRN: [id number] Date of Birth: 01-14-1956

## 2019-08-17 ENCOUNTER — Ambulatory Visit: Payer: Managed Care, Other (non HMO) | Admitting: Speech Pathology

## 2019-08-17 ENCOUNTER — Other Ambulatory Visit: Payer: Self-pay

## 2019-08-17 DIAGNOSIS — R41841 Cognitive communication deficit: Secondary | ICD-10-CM

## 2019-08-18 ENCOUNTER — Encounter: Payer: Self-pay | Admitting: Speech Pathology

## 2019-08-18 NOTE — Therapy (Signed)
[place] MAIN [facility] SERVICES [address] [place], [place], [address] Phone: [phone]   Fax:  [phone]  Speech Language Pathology Treatment  Patient Details  Name: Connie [name] MRN: [id number] Date of Birth: 1955/10/17 Referring Provider (SLP): Jennings Books MD   Encounter Date: 08/17/2019  End of Session - 08/18/19 [id number]    Visit Number  2    Number of Visits  13    Date for SLP Re-Evaluation  09/27/19    Authorization Type  [employer]    Authorization - Visit Number  2    Authorization - Number of Visits  10    SLP Start Time  [id number]    SLP Stop Time   1450    SLP Time Calculation (min)  35 min    Activity Tolerance  Patient tolerated treatment well       Past Medical History:  Diagnosis Date  . Anemia    in the past  . Anxiety   . Arthritis    all over  . Cervical spondylosis with myelopathy 2018  . Chronic pain 2019   can't stand straight, uses walker for stability  . DDD (degenerative disc disease), lumbar   . Depression   . Displacement of cervical intervertebral disc 2018  . Dyspnea    HANDICAPPED  . Fibromyalgia    Polymyalgia Rheumatica  . GERD (gastroesophageal reflux disease)   . Heart murmur    not treated or being followed  . Hypertension    CONTROLLED  . Hypothyroidism   . Lumbar post-laminectomy syndrome 2018  . Lumbosacral radiculitis 2018  . Neuropathy due to medical condition (HCC)    feet, toes, fingers  . Osteoarthritis   . Osteoporosis   . Other long term (current) drug therapy 2019   from pain management  . Pain in the coccyx 2018   disorder of sacrum  . Spondylolisthesis 2018  . Spondylosis of lumbosacral region without myelopathy or radiculopathy 2018  . Spondylosis with myelopathy, lumbar region 2018  . Vitamin D deficiency     Past Surgical History:  Procedure Laterality Date  . ABDOMINAL HYSTERECTOMY  2000  . APPENDECTOMY    . arm surgery  2002  . BREAST BIOPSY Left 2005   benign  . CATARACT EXTRACTION W/PHACO Right 10/21/2017   Procedure: CATARACT EXTRACTION PHACO AND INTRAOCULAR LENS PLACEMENT ([place]) RIGHT;  Surgeon: Leandrew Koyanagi, MD;  Location: [place];  Service: Ophthalmology;  Laterality: Right;  neck issues  . CESAREAN SECTION     x 2  . CHOLECYSTECTOMY    . COLONOSCOPY WITH PROPOFOL N/A 08/25/2017   Procedure: COLONOSCOPY WITH PROPOFOL;  Surgeon: Toledo, Benay Pike, MD;  Location: [facility];  Service: Gastroenterology;  Laterality: N/A;  . epidural injection  2018   steroids  . GASTRIC BYPASS  2004  . JOINT REPLACEMENT Bilateral 2005   knees  . LUMBAR FUSION  2016   x 2/ L3-5  . SHOULDER ARTHROSCOPY WITH ROTATOR CUFF REPAIR Right 2008   x 3  . SPINAL CORD STIMULATOR IMPLANT  2018   [facility] scientific  . TEMPORAL ARTERY BIOPSY / LIGATION    . TONSILLECTOMY      There were no vitals filed for this visit.  Subjective Assessment - 08/18/19 0838    Subjective  Patient was pleasant and engaged            ADULT SLP TREATMENT - 08/18/19 0001      General Information  Behavior/Cognition  Cooperative;Pleasant mood;Confused      Treatment Provided   Treatment provided  Cognitive-Linquistic      Pain Assessment   Pain Assessment  No/denies pain      Cognitive-Linquistic Treatment   Treatment focused on  Cognition    Skilled Treatment  The patient was educated on strategies to be utilized for improving memory, particularly external aids.      Assessment / Recommendations / Plan   Plan  Continue with current plan of care      Progression Toward Goals   Progression toward goals  Progressing toward goals       SLP Education - 08/18/19 0838    Education Details  external memory aids    Person(s) Educated  Patient    Methods  Explanation    Comprehension  Verbalized understanding         SLP Long Term Goals - 08/16/19 0900      SLP LONG TERM GOAL #1   Title  Patient will identify cognitive-communication  barriers and participate in developing functional compensatory strategies.    Time  6    Period  Weeks    Status  New    Target Date  09/27/19      SLP LONG TERM GOAL #2   Title  Patient will demonstrate oral/written comprehension for abstract/complex verbal/written information via grammatical, fluent, and cogent sentences to complete abstract/complex linguistic tasks with 80% accuracy.    Time  6    Period  Weeks    Status  New    Target Date  09/27/19      SLP LONG TERM GOAL #3   Title  Patient will complete complex auditory attention/vigilance/memory tasks with 80% accuracy.    Time  6    Period  Weeks    Status  New    Target Date  09/27/19      SLP LONG TERM GOAL #4   Title  The patient will utilize compensatory strategies to remember daily tasks with 80% accuracy given min cues from the clinician.    Time  6    Period  Weeks    Status  New    Target Date  09/27/19       Plan - 08/18/19 0839    Clinical Impression Statement  Connie [name], who has a medical history significant for severe pain and depression, is presenting with a cognitive impairment. Her naming and attention are her relative strengths. She demonstrates impairment in the realms of memory, and language. Patient shared that her memory issues are interfering with her daily life, she has difficulty remembering to take medication, attend appointments,complete ADLs, etc. Her speech is noted to be mildly slurred with delayed initiation of responses to questions. The patient also does report some concerns with word-finding difficulty.    Speech Therapy Frequency  2x / week    Duration  Other (comment)   6 weeks   Treatment/Interventions  Compensatory strategies;Functional tasks;Patient/family education;Cognitive reorganization;Compensatory techniques;Internal/external aids    Potential Considerations  Ability to learn/carryover information;Previous level of function;Co-morbidities    Consulted and Agree with  Plan of Care  Patient       Patient will benefit from skilled therapeutic intervention in order to improve the following deficits and impairments:   Cognitive communication deficit    Problem List Patient Active Problem List   Diagnosis Date Noted  . MDD (major depressive disorder), recurrent episode, moderate ([place]) 01/11/2019  . GAD (generalized anxiety disorder) 01/11/2019  .  Bereavement 01/11/2019  . Opioid use with withdrawal ([place]) 01/11/2019  . Pain syndrome, chronic 09/21/2018  . Low serum vitamin B12 09/15/2018  . Moderate episode of recurrent major depressive disorder ([place]) 09/15/2018  . Memory loss 05/12/2018  . Chronic pain of both shoulders 05/12/2018  . Osteoporosis, post-menopausal 12/02/2017  . Vitamin D deficiency, unspecified 07/22/2017  . Candidiasis of skin and nails 07/13/2017  . Menopausal symptom 07/13/2017  . Bilateral hand pain 06/26/2017  . CRP elevated 06/26/2017  . Jaw pain, non-TMJ 06/26/2017  . Temporal headache 06/26/2017  . Stiffness of shoulder joint 06/26/2017  . Screening for osteoporosis 06/26/2017  . SOBOE (shortness of breath on exertion) 06/09/2017  . Joint pain 05/20/2017  . Arthritis 05/20/2017  . History of lumbar fusion 04/23/2017  . Lumbar degenerative disc disease 04/23/2017  . Fibromyalgia 04/23/2017  . Cervicalgia 04/23/2017  . DDD (degenerative disc disease), cervical 04/23/2017  . Chronic pain syndrome 04/23/2017  . Major depressive disorder 04/23/2017  . Spinal cord stimulator status 04/23/2017  . Complete tear of right rotator cuff 04/17/2017  . Rotator cuff tendinitis, right 04/17/2017  . Rotator cuff arthropathy of both shoulders 04/12/2017  . Degenerative joint disease of cervical and lumbar spine 03/17/2017  . Generalized osteoarthritis of multiple sites 03/17/2017  . Seasonal allergies 03/17/2017  . GERD without esophagitis 01/18/2017  . Fluid collection at surgical site 01/18/2017  . Hypothyroidism 01/18/2017  .  Fatty liver 12/08/2016  . Osteoporosis 09/30/2016  . Pain in the coccyx 08/19/2016  . Delusions of parasitosis ([place]) 05/27/2016  . Essential hypertension 04/09/2016  . Chiari malformation type I ([place]) 03/24/2016  . Obesity, morbid ([place]) 03/24/2016  . Lumbosacral spondylosis without myelopathy 09/17/2015  . Chronic midline low back pain 03/28/2015  . Lumbosacral radiculitis 03/05/2015  . 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 Medication-induced delirium, acute, hyperactive 11/17/2014  . Fatigue 05/08/2014  . Lumbar post-laminectomy syndrome 05/08/2014  . Lumbar radiculopathy 05/08/2014  . Cervical radiculopathy 05/08/2014  . H/O gastric bypass 03/03/2014  . Dysphagia, oropharyngeal phase 05/18/2013  . Acquired spondylolisthesis 08/23/2012  . Nonunion of fracture 08/23/2012  . Spinal stenosis of lumbar region without neurogenic claudication 08/23/2012  . Chronic constipation 07/26/2012  . Dry eyes, bilateral 07/26/2012  . Leg cramps 07/26/2012  . Diastolic dysfunction 0000000  . Daytime somnolence 06/02/2012  . Insomnia due to mental disorder 05/11/2012  . Lumbar spondylosis 04/06/2012  . Lumbar pseudoarthrosis 04/06/2012  . Iron deficiency 01/14/2012  . Adiposis dolorosa 12/16/2011  . Chronic pain 12/16/2011  . Edema 12/16/2011  . Internal hemorrhoid 12/16/2011  . OA (osteoarthritis) 12/16/2011    Connie [name] 08/18/2019, 8:40 AM  [place] MAIN [facility] SERVICES [address] [place], [place], [address] Phone: [phone]   Fax:  [phone]   Name: Connie [name] MRN: [id number] Date of Birth: [birth year]

## 2019-08-22 ENCOUNTER — Encounter: Payer: 59 | Admitting: Speech Pathology

## 2019-08-22 ENCOUNTER — Ambulatory Visit: Payer: Managed Care, Other (non HMO) | Admitting: Speech Pathology

## 2019-08-24 ENCOUNTER — Ambulatory Visit: Payer: Managed Care, Other (non HMO) | Admitting: Speech Pathology

## 2019-08-24 ENCOUNTER — Encounter: Payer: 59 | Admitting: Speech Pathology

## 2019-08-25 ENCOUNTER — Other Ambulatory Visit: Payer: Self-pay

## 2019-08-25 ENCOUNTER — Telehealth (INDEPENDENT_AMBULATORY_CARE_PROVIDER_SITE_OTHER): Payer: 59 | Admitting: Psychiatry

## 2019-08-25 ENCOUNTER — Encounter: Payer: Self-pay | Admitting: Psychiatry

## 2019-08-25 DIAGNOSIS — F3341 Major depressive disorder, recurrent, in partial remission: Secondary | ICD-10-CM

## 2019-08-25 DIAGNOSIS — F411 Generalized anxiety disorder: Secondary | ICD-10-CM | POA: Diagnosis not present

## 2019-08-25 DIAGNOSIS — R413 Other amnesia: Secondary | ICD-10-CM

## 2019-08-25 DIAGNOSIS — F5105 Insomnia due to other mental disorder: Secondary | ICD-10-CM | POA: Diagnosis not present

## 2019-08-25 IMAGING — CT CT CERVICAL SPINE W/ CM
3 of 4 series · 9 of 33 positions shown, 11 images · IV contrast (isovue)
Comparison: None.

CLINICAL DATA: Chronic neck pain.  Cervical myelopathy.

EXAM:
CT MYELOGRAPHY CERVICAL SPINE
TECHNIQUE: CT imaging of the cervical spine was performed after Isovue 300M
contrast administration. Multiplanar CT image reconstructions were
also generated.

[Series 4: sagittal bone · sagittal · 0.22mm/px · 5 of 61 slices shown, 6 images]
[im 21/61  bone]
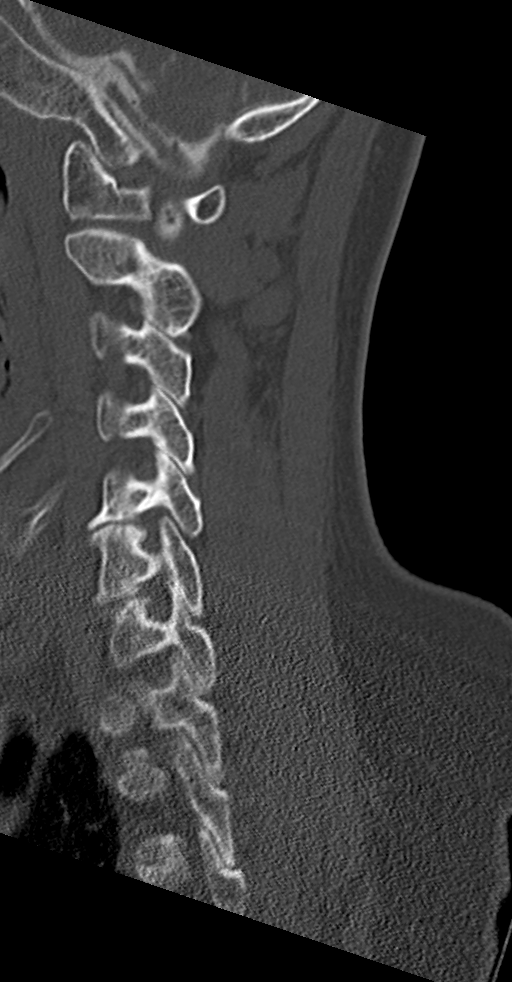
[im 26/61  bone]
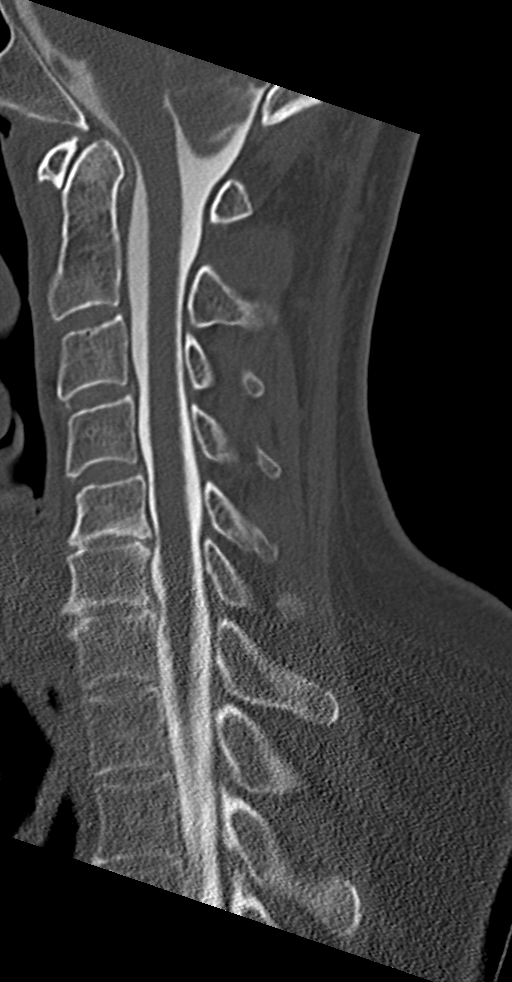
[im 31/61  soft-tissue]
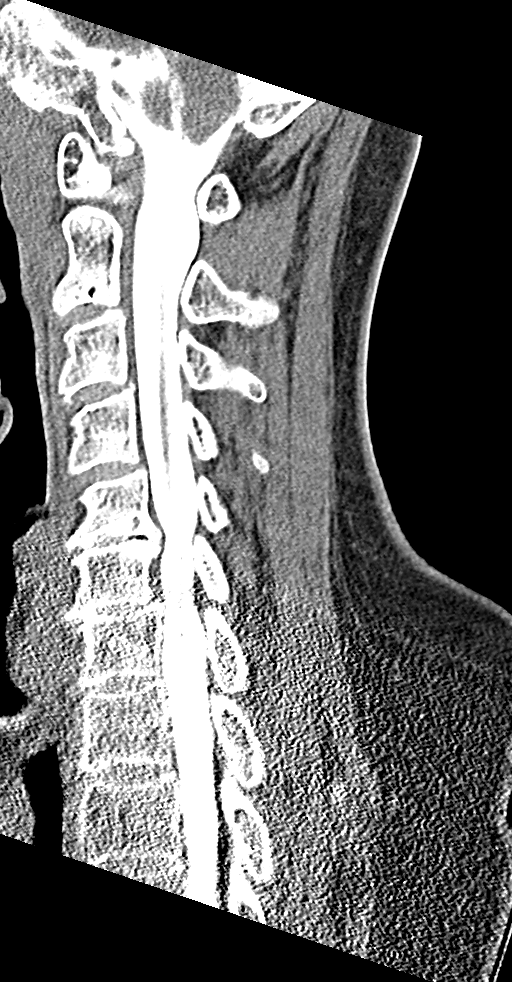
[im 31/61  bone]
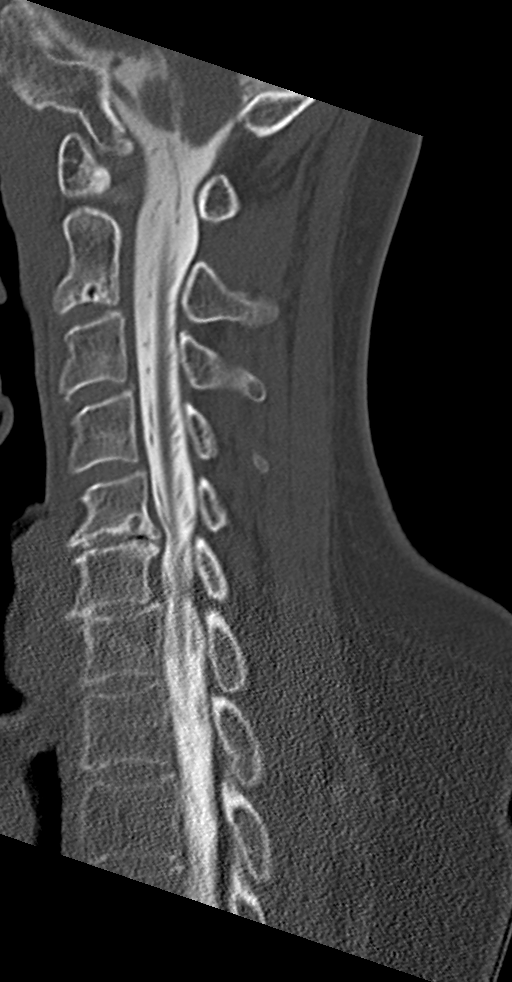
[im 36/61  bone]
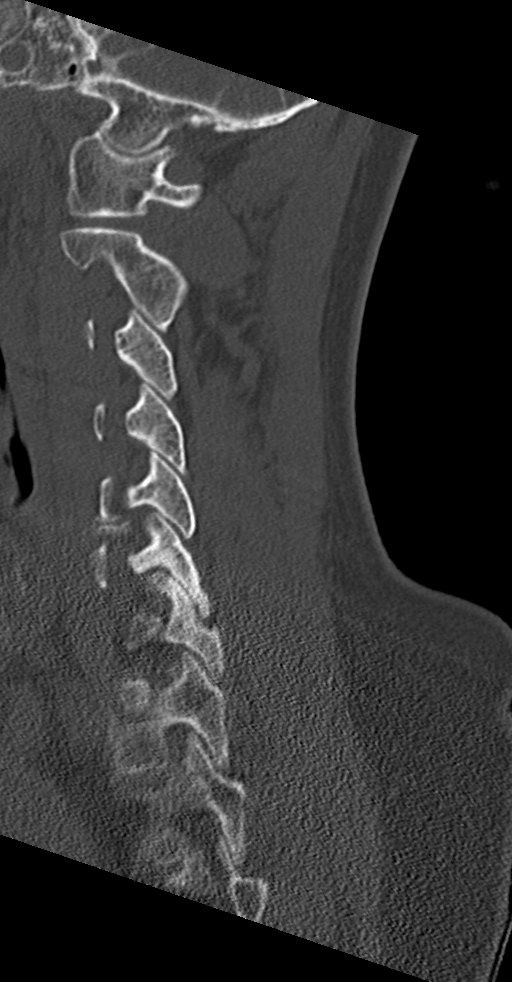
[im 41/61  bone]
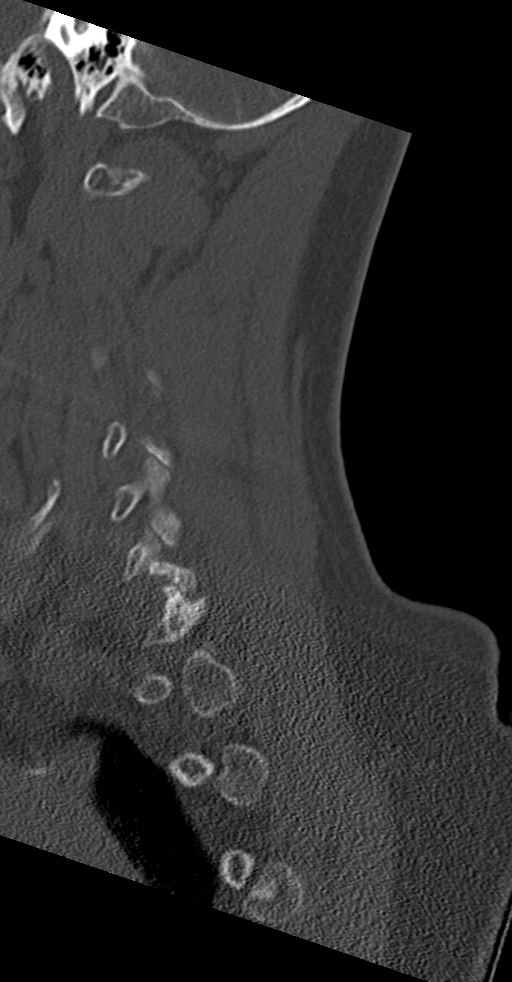

[Series 5: coronal bone · coronal · 0.25mm/px · 3 of 44 slices shown]
[im 9/44  bone]
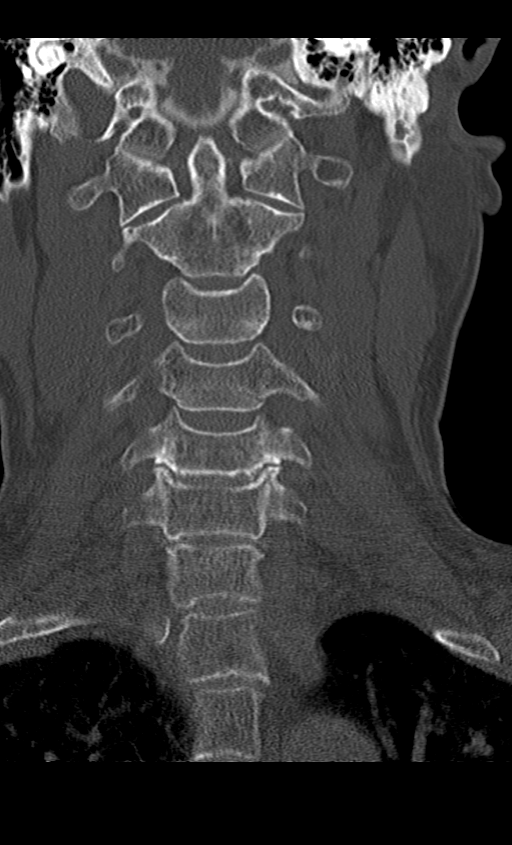
[im 18/44  bone]
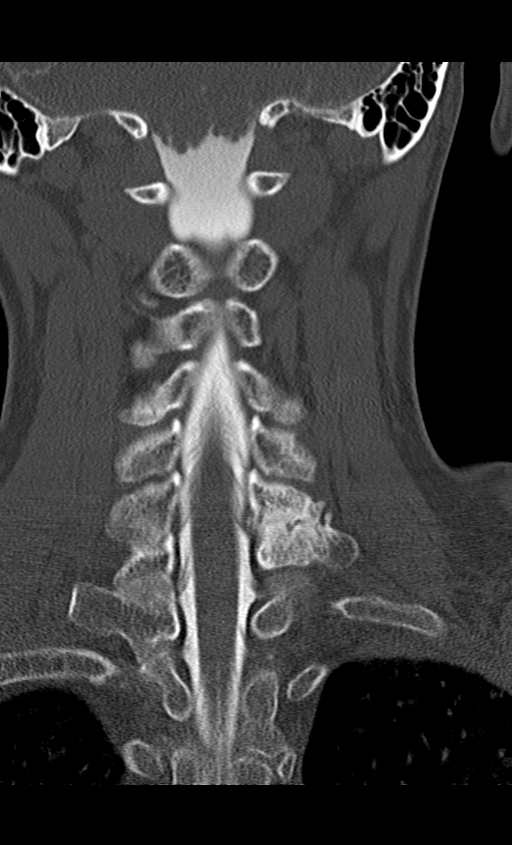
[im 26/44  bone]
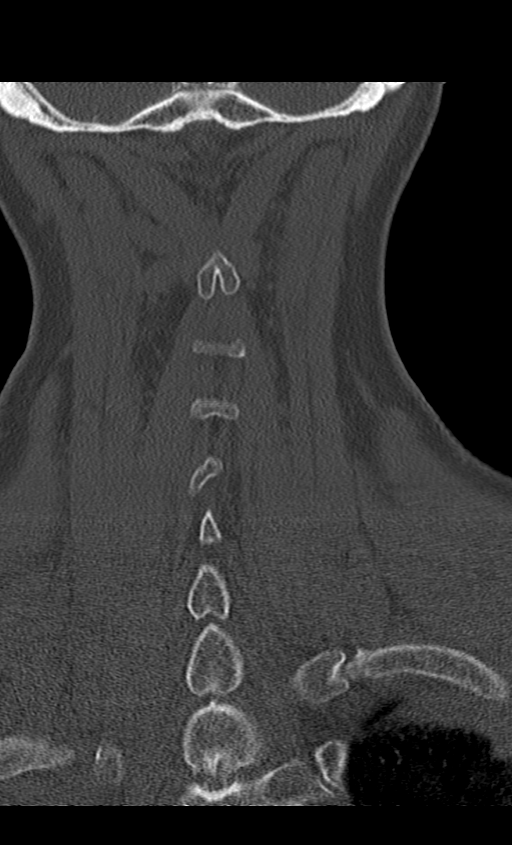

[Series 6: orthogonal bone · axial · 0.23mm/px · z∈[-243,-243]mm · 1 of 100 slices shown, 2 images]
[im 57/100  soft-tissue]
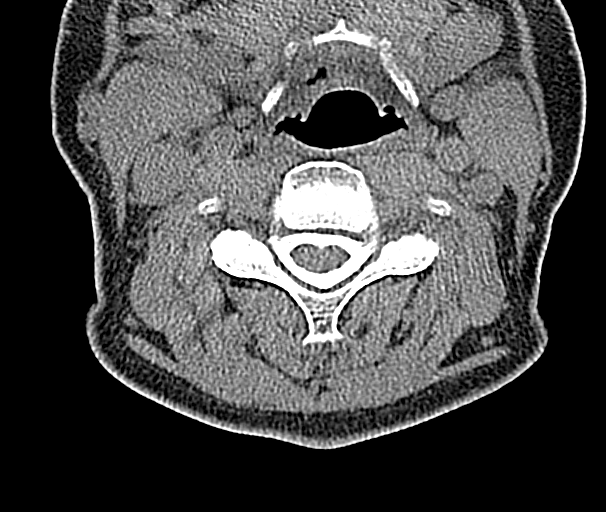
[im 57/100  bone]
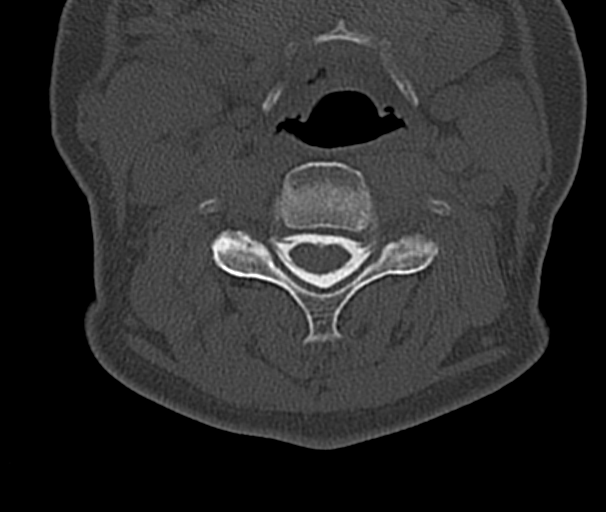

[9 of 33 positions shown; findings below may reference images not displayed]

FINDINGS: CSF opacification is excellent.

Alignment: Slight anterolisthesis present at C3-4, C4-5, and C6-7.
There is straightening of the normal cervical lordosis.

Vertebrae: Chronic endplate sclerotic changes are present at C5-6
and C6-7. Vertebral body heights are maintained. No focal lytic or
blastic lesions are present.

Cord: Court size and shape is normal throughout.

Posterior Fossa and paraspinal tissues: The cerebellar tonsils
extend just below the foramen magnum on midline cuts. There is
further extension on the right without a Chiari malformation.
Intracranial structures are otherwise within normal limits.

Disc levels:

C2-3: Negative.

C3-4: Negative.

C4-5: There is slight uncovering of a disc bulge. No significant
stenosis is present.

C5-6: Asymmetric uncovertebral spurring is worse on the left. There
is partial effacement of the ventral CSF. There is no significant
distortion of the cord. Moderate left mild right foraminal stenosis
is present.

C6-7: A broad-based disc osteophyte complex is present. Asymmetric
left-sided facet hypertrophy is noted. There is partial effacement
of the ventral CSF. Mild foraminal narrowing is present bilaterally.

C7-T1: Negative.
IMPRESSION: 1. Leftward disc osteophyte complex with mild central canal stenosis
at C5-6.
2. Moderate left and mild right foraminal narrowing at C5-6.
3. Mild central and bilateral foraminal narrowing at C6-7 due to a
broad-based disc osteophyte complex and bilateral uncovertebral
spurring. Facet hypertrophy is worse on the left.
4. Slight uncovering of the disc bulge at C4-5 without significant
focal stenosis.

## 2019-08-25 MED ORDER — BUPROPION HCL ER (SR) 200 MG PO TB12: ORAL_TABLET | ORAL | 0 refills | Status: DC

## 2019-08-25 MED ORDER — HYDROXYZINE PAMOATE 25 MG PO CAPS: 25.0000 mg | ORAL_CAPSULE | Freq: Every evening | ORAL | 2 refills | Status: DC | PRN

## 2019-08-25 MED ORDER — TRAZODONE HCL 100 MG PO TABS: ORAL_TABLET | ORAL | 0 refills | Status: DC

## 2019-08-25 NOTE — Progress Notes (Signed)
Provider Location : [facility] Patient Location : Home  Virtual Visit via Video Note  I connected with Connie [name] on 08/25/19 at  2:30 PM EDT by a video enabled telemedicine application and verified that I am speaking with the correct person using two identifiers.   I discussed the limitations of evaluation and management by telemedicine and the availability of in person appointments. The patient expressed understanding and agreed to proceed.    I discussed the assessment and treatment plan with the patient. The patient was provided an opportunity to ask questions and all were answered. The patient agreed with the plan and demonstrated an understanding of the instructions.   The patient was advised to call back or seek an in-person evaluation if the symptoms worsen or if the condition fails to improve as anticipated.   [place] MD OP Progress Note  08/25/2019 6:12 PM Connie [name]  MRN:  [id number]  Chief Complaint:  Chief Complaint    Follow-up     HPI: Connie [name] is a 64 year old Caucasian female, married, on SSD, lives in [place], has a history of depression, anxiety disorder, insomnia, fibromyalgia, chronic pain on opioid medication, hypertension, hypothyroidism, polyarthralgia was evaluated by telemedicine today.  Patient today reports she is currently struggling with cognitive problems.  She reports she is unable to comprehend what she reads.  She reports she is often confused about things around her which does not last for too long.  She reports her neurologist is currently running several test on her.  She does have upcoming appointment for follow-up soon.  Patient reports she has also noticed excessive sleep during the day.  She reports when she is unable to focus while reading a book she does falls asleep.  This has been going on for a while.  Patient denies any significant depression.  She does report anxiety about her health issues however overall her anxiety symptoms are currently  improving on the current medication regimen.  Patient denies any suicidality, homicidality or perceptual disturbances.  Patient appeared to be alert, oriented to person place time and situation.  She was able to answer questions appropriately.  Patient denies any other concerns today. Visit Diagnosis:    ICD-10-CM   1. MDD (major depressive disorder), recurrent, in partial remission (HCC)  F33.41 hydrOXYzine (VISTARIL) 25 MG capsule    buPROPion (WELLBUTRIN SR) 200 MG 12 hr tablet    traZODone (DESYREL) 100 MG tablet  2. GAD (generalized anxiety disorder)  F41.1   3. Insomnia due to mental disorder  F51.05 hydrOXYzine (VISTARIL) 25 MG capsule  4. Memory loss  R41.3     Past Psychiatric History: I have reviewed past psychiatric history from my progress note on 08/06/2017.  Past trials of Latuda, Wellbutrin, Cymbalta  Past Medical History:  Past Medical History:  Diagnosis Date  . Anemia    in the past  . Anxiety   . Arthritis    all over  . Cervical spondylosis with myelopathy 2018  . Chronic pain 2019   can't stand straight, uses walker for stability  . DDD (degenerative disc disease), lumbar   . Depression   . Displacement of cervical intervertebral disc 2018  . Dyspnea    HANDICAPPED  . Fibromyalgia    Polymyalgia Rheumatica  . GERD (gastroesophageal reflux disease)   . Heart murmur    not treated or being followed  . Hypertension    CONTROLLED  . Hypothyroidism   . Lumbar post-laminectomy syndrome 2018  . Lumbosacral radiculitis 2018  .  Neuropathy due to medical condition (HCC)    feet, toes, fingers  . Osteoarthritis   . Osteoporosis   . Other long term (current) drug therapy 2019   from pain management  . Pain in the coccyx 2018   disorder of sacrum  . Spondylolisthesis 2018  . Spondylosis of lumbosacral region without myelopathy or radiculopathy 2018  . Spondylosis with myelopathy, lumbar region 2018  . Vitamin D deficiency     Past Surgical History:   Procedure Laterality Date  . ABDOMINAL HYSTERECTOMY  2000  . APPENDECTOMY    . arm surgery  2002  . BREAST BIOPSY Left 2005   benign  . CATARACT EXTRACTION W/PHACO Right 10/21/2017   Procedure: CATARACT EXTRACTION PHACO AND INTRAOCULAR LENS PLACEMENT ([place]) RIGHT;  Surgeon: Leandrew Koyanagi, MD;  Location: [place];  Service: Ophthalmology;  Laterality: Right;  neck issues  . CESAREAN SECTION     x 2  . CHOLECYSTECTOMY    . COLONOSCOPY WITH PROPOFOL N/A 08/25/2017   Procedure: COLONOSCOPY WITH PROPOFOL;  Surgeon: Toledo, Benay Pike, MD;  Location: [facility];  Service: Gastroenterology;  Laterality: N/A;  . epidural injection  2018   steroids  . GASTRIC BYPASS  2004  . JOINT REPLACEMENT Bilateral 2005   knees  . LUMBAR FUSION  2016   x 2/ L3-5  . SHOULDER ARTHROSCOPY WITH ROTATOR CUFF REPAIR Right 2008   x 3  . SPINAL CORD STIMULATOR IMPLANT  2018   [facility] scientific  . TEMPORAL ARTERY BIOPSY / LIGATION    . TONSILLECTOMY      Family Psychiatric History: I have reviewed family psychiatric history from my progress note on 08/06/2017.  Family History:  Family History  Problem Relation Age of Onset  . Kidney disease Mother   . Cancer Mother   . Anxiety disorder Mother   . Depression Mother   . Breast cancer Mother   . Cancer Father   . Hypertension Father   . Cancer Brother   . Breast cancer Maternal Aunt   . Breast cancer Cousin     Social History: I have reviewed social history from my progress note on 08/06/2017. Social History   Socioeconomic History  . Marital status: Married    Spouse name: brady   . Number of children: 2  . Years of education: Not on file  . Highest education level: High school graduate  Occupational History    Comment: disabled   Tobacco Use  . Smoking status: Former Smoker    Packs/day: 0.50    Years: 20.00    Pack years: 10.00    Types: Cigarettes    Quit date: 05/21/2007    Years since quitting: 12.2  . Smokeless  tobacco: Never Used  Substance and Sexual Activity  . Alcohol use: No  . Drug use: No    Comment: managed by pain clinic  . Sexual activity: Not Currently  Other Topics Concern  . Not on file  Social History Narrative  . Not on file   Social Determinants of Health   Financial Resource Strain:   . Difficulty of Paying Living Expenses:   Food Insecurity:   . Worried About Charity fundraiser in the Last Year:   . Arboriculturist in the Last Year:   Transportation Needs:   . Film/video editor (Medical):   Marland Kitchen Lack of Transportation (Non-Medical):   Physical Activity:   . Days of Exercise per Week:   . Minutes of Exercise  per Session:   Stress:   . Feeling of Stress :   Social Connections:   . Frequency of Communication with Friends and Family:   . Frequency of Social Gatherings with Friends and Family:   . Attends Religious Services:   . Active Member of Clubs or Organizations:   . Attends Archivist Meetings:   Marland Kitchen Marital Status:     Allergies:  Allergies  Allergen Reactions  . Tape Rash    Skin peels off and blisters.  Probably was clear plastic tape.    Metabolic Disorder Labs: No results found for: HGBA1C, MPG No results found for: PROLACTIN No results found for: CHOL, TRIG, HDL, CHOLHDL, VLDL, LDLCALC No results found for: TSH  Therapeutic Level Labs: No results found for: LITHIUM No results found for: VALPROATE No components found for:  CBMZ  Current Medications: Current Outpatient Medications  Medication Sig Dispense Refill  . azelastine (ASTELIN) 0.1 % nasal spray Place into the nose.    Marland Kitchen azelastine (ASTELIN) 0.1 % nasal spray U 1 SPR IEN BID    . buPROPion (WELLBUTRIN SR) 200 MG 12 hr tablet TAKE 1 TABLET(200 MG) BY MOUTH TWICE DAILY 180 tablet 0  . cefdinir (OMNICEF) 300 MG capsule Take 300 mg by mouth daily.     . celecoxib (CELEBREX) 200 MG capsule Take 200 mg by mouth 2 (two) times daily. AM    . citalopram (CELEXA) 20 MG tablet Take  1.5 tablets (30 mg total) by mouth daily. 135 tablet 0  . esomeprazole (NEXIUM) 40 MG capsule esomeprazole magnesium 40 mg capsule,delayed release    . furosemide (LASIX) 20 MG tablet Take 20 mg by mouth as needed.    . gabapentin (NEURONTIN) 300 MG capsule Take 1 capsule (300 mg total) by mouth 2 (two) times daily. 60 capsule 5  . glycopyrrolate (ROBINUL) 1 MG tablet Take 1 mg by mouth 2 (two) times daily. AM AND HS  3  . glycopyrrolate (ROBINUL) 1 MG tablet TAKE 1 TABLET(1 MG) BY MOUTH TWICE DAILY    . hydrOXYzine (VISTARIL) 25 MG capsule Take 1 capsule (25 mg total) by mouth at bedtime as needed. 90 capsule 2  . ibuprofen (ADVIL,MOTRIN) 200 MG tablet Take 400 mg by mouth daily as needed for headache or moderate pain.    Marland Kitchen levofloxacin (LEVAQUIN) 500 MG tablet     . levorphanol (LEVODROMORAN) 2 MG tablet 3 mg in am, 2 mg in afternoon, and 2mg  evening For chronic pain, 30 day Rx 105 tablet 0  . [START ON 09/18/2019] levorphanol (LEVODROMORAN) 2 MG tablet 3 mg in am, 2 mg in afternoon, and 2mg  evening For chronic pain, 30 day Rx 105 tablet 0  . levothyroxine (SYNTHROID) 50 MCG tablet     . loratadine (CLARITIN) 10 MG tablet Take 10 mg by mouth daily as needed for allergies.    . metoprolol succinate (TOPROL-XL) 100 MG 24 hr tablet Take 100 mg by mouth once daily in the evening  0  . ondansetron (ZOFRAN-ODT) 8 MG disintegrating tablet Take 8 mg by mouth daily as needed for nausea or vomiting.    . pantoprazole (PROTONIX) 40 MG tablet TAKE 1 TABLET(40 MG) BY MOUTH EVERY DAY    . predniSONE (DELTASONE) 5 MG tablet Take 4 mg by mouth daily.    . traZODone (DESYREL) 100 MG tablet TAKE 1 TABLET(100 MG) BY MOUTH AT BEDTIME AS NEEDED FOR SLEEP 90 tablet 0  . vitamin B-12 (CYANOCOBALAMIN) 1000 MCG tablet Take by  mouth.     No current facility-administered medications for this visit.     Musculoskeletal: Strength & Muscle Tone: UTA Gait & Station: Walks with walker Patient leans: N/A  Psychiatric  Specialty Exam: Review of Systems  Psychiatric/Behavioral: Positive for decreased concentration. The patient is nervous/anxious.   All other systems reviewed and are negative.   There were no vitals taken for this visit.There is no height or weight on file to calculate BMI.  General Appearance: Casual  Eye Contact:  Fair  Speech:  Normal Rate  Volume:  Normal  Mood:  Anxious  Affect:  Congruent  Thought Process:  Goal Directed and Descriptions of Associations: Intact  Orientation:  Full (Time, Place, and Person)  Thought Content: Logical   Suicidal Thoughts:  No  Homicidal Thoughts:  No  Memory:  Immediate;   Reports short term memory issues Recent;   Fair Remote;   Fair  Judgement:  Fair  Insight:  Fair  Psychomotor Activity:  Normal  Concentration:  Concentration: Fair and Attention Span: Fair  Recall:  [employer] of Knowledge: Fair  Language: Fair  Akathisia:  No  Handed:  Right  AIMS (if indicated): UTA  Assets:  Communication Skills Desire for Improvement Social Support  ADL's:  Intact  Cognition: Impaired,  Mild  Sleep:  Fair   Screenings: PHQ2-9     Office Visit from 07/13/2018 in [facility] Clinical Support from 04/15/2018 in [facility] Clinical Support from 01/27/2018 in [facility] Clinical Support from 11/05/2017 in [place] Procedure visit from 08/19/2017 in [facility]  PHQ-2 Total Score  0  0  0  0  0       Assessment and Plan: Connie [name] is a 64 year old Caucasian female who has a history of depression, chronic pain, GERD, hypothyroidism, hypertension, fibromyalgia was evaluated by telemedicine today.  Patient with psychosocial stressors of her health issues, as well as the pandemic.  Patient is currently struggling with memory issues, excessive  sleepiness and is currently being worked up by neurology.  Plan as noted below.  Plan MDD-improving Wellbutrin SR 200 mg p.o. twice daily Celexa 30 mg p.o. daily  GAD-improving Celexa as prescribed Hydroxyzine 25 mg p.o. 3 times daily as needed  Insomnia-stable Trazodone 100 mg p.o. nightly as needed-reduced dosage She also has hydroxyzine 25 mg p.o. 3 times daily as needed.  She reports she has been using it only at bedtime.  Discussed with patient to limit use.  Memory problems-unstable Patient is currently attending speech therapy. She is under the care of neurology. She has several work-up done including EEG, EMG-pending report. Most recent [place] in E HR-22 out of 30.  Follow-up in clinic in 6 to 8 weeks or sooner if needed.  I have spent atleast 20 minutes non face to face with patient today. More than 50 % of the time was spent for preparing to see the patient ( e.g., review of test, records ),  ordering medications and test ,psychoeducation and supportive psychotherapy and care coordination,as well as documenting clinical information in electronic health record. This note was generated in part or whole with voice recognition software. Voice recognition is usually quite accurate but there are transcription errors that can and very often do occur. I apologize for any typographical errors that were not detected and corrected.  Ursula Alert, MD 08/25/2019, 6:12 PM

## 2019-08-29 ENCOUNTER — Ambulatory Visit: Payer: Managed Care, Other (non HMO) | Attending: Neurology | Admitting: Speech Pathology

## 2019-08-29 ENCOUNTER — Other Ambulatory Visit: Payer: Self-pay

## 2019-08-29 ENCOUNTER — Encounter: Payer: 59 | Admitting: Speech Pathology

## 2019-08-29 DIAGNOSIS — R41841 Cognitive communication deficit: Secondary | ICD-10-CM | POA: Diagnosis present

## 2019-08-30 ENCOUNTER — Encounter: Payer: Self-pay | Admitting: Speech Pathology

## 2019-08-30 NOTE — Therapy (Signed)
[place] MAIN [facility] SERVICES [address] [place], [place], [address] Phone: [phone]   Fax:  [phone]  Speech Language Pathology Treatment  Patient Details  Name: Connie [name] MRN: [id number] Date of Birth: 1956/01/26 Referring Provider (SLP): Jennings Books MD   Encounter Date: 08/29/2019  End of Session - 08/30/19 0817    Visit Number  3    Number of Visits  13    Date for SLP Re-Evaluation  09/27/19    Authorization Type  [employer]    Authorization - Visit Number  3    Authorization - Number of Visits  10    SLP Start Time  [id number]    SLP Stop Time   1455    SLP Time Calculation (min)  40 min    Activity Tolerance  Patient tolerated treatment well       Past Medical History:  Diagnosis Date  . Anemia    in the past  . Anxiety   . Arthritis    all over  . Cervical spondylosis with myelopathy 2018  . Chronic pain 2019   can't stand straight, uses walker for stability  . DDD (degenerative disc disease), lumbar   . Depression   . Displacement of cervical intervertebral disc 2018  . Dyspnea    HANDICAPPED  . Fibromyalgia    Polymyalgia Rheumatica  . GERD (gastroesophageal reflux disease)   . Heart murmur    not treated or being followed  . Hypertension    CONTROLLED  . Hypothyroidism   . Lumbar post-laminectomy syndrome 2018  . Lumbosacral radiculitis 2018  . Neuropathy due to medical condition (HCC)    feet, toes, fingers  . Osteoarthritis   . Osteoporosis   . Other long term (current) drug therapy 2019   from pain management  . Pain in the coccyx 2018   disorder of sacrum  . Spondylolisthesis 2018  . Spondylosis of lumbosacral region without myelopathy or radiculopathy 2018  . Spondylosis with myelopathy, lumbar region 2018  . Vitamin D deficiency     Past Surgical History:  Procedure Laterality Date  . ABDOMINAL HYSTERECTOMY  2000  . APPENDECTOMY    . arm surgery  2002  . BREAST BIOPSY Left 2005    benign  . CATARACT EXTRACTION W/PHACO Right 10/21/2017   Procedure: CATARACT EXTRACTION PHACO AND INTRAOCULAR LENS PLACEMENT ([place]) RIGHT;  Surgeon: Leandrew Koyanagi, MD;  Location: [employer];  Service: Ophthalmology;  Laterality: Right;  neck issues  . CESAREAN SECTION     x 2  . CHOLECYSTECTOMY    . COLONOSCOPY WITH PROPOFOL N/A 08/25/2017   Procedure: COLONOSCOPY WITH PROPOFOL;  Surgeon: Toledo, Benay Pike, MD;  Location: [facility];  Service: Gastroenterology;  Laterality: N/A;  . epidural injection  2018   steroids  . GASTRIC BYPASS  2004  . JOINT REPLACEMENT Bilateral 2005   knees  . LUMBAR FUSION  2016   x 2/ L3-5  . SHOULDER ARTHROSCOPY WITH ROTATOR CUFF REPAIR Right 2008   x 3  . SPINAL CORD STIMULATOR IMPLANT  2018   [facility] scientific  . TEMPORAL ARTERY BIOPSY / LIGATION    . TONSILLECTOMY      There were no vitals filed for this visit.  Subjective Assessment - 08/30/19 0816    Subjective  Patient was pleasant and engaged, reports significant pain last week in legs            ADULT SLP TREATMENT - 08/30/19 0001  General Information   Behavior/Cognition  Cooperative;Pleasant mood;Confused      Treatment Provided   Treatment provided  Cognitive-Linquistic      Cognitive-Linquistic Treatment   Treatment focused on  Cognition    Skilled Treatment  The patient was educated on identification of systems and was prompted to complete various memory tasks utilizing strategies. She was 100% accurate on visual reasoning task utilizing association technique. She was 100% accurate on recall of list of 7 semantically related words utilizing visualization technique. She remembered a list of 9 visual items utilizing the categorization technique. Patient remembered names in association with external stimuli with 80% accuracy. Given min clinician cues, she was 100% accurate on task.      Assessment / Recommendations / Plan   Plan  Continue with current plan of  care      Progression Toward Goals   Progression toward goals  Progressing toward goals       SLP Education - 08/30/19 0816    Education Details  internal memory aids    Person(s) Educated  Patient    Methods  Explanation    Comprehension  Verbalized understanding         SLP Long Term Goals - 08/16/19 0900      SLP LONG TERM GOAL #1   Title  Patient will identify cognitive-communication barriers and participate in developing functional compensatory strategies.    Time  6    Period  Weeks    Status  New    Target Date  09/27/19      SLP LONG TERM GOAL #2   Title  Patient will demonstrate oral/written comprehension for abstract/complex verbal/written information via grammatical, fluent, and cogent sentences to complete abstract/complex linguistic tasks with 80% accuracy.    Time  6    Period  Weeks    Status  New    Target Date  09/27/19      SLP LONG TERM GOAL #3   Title  Patient will complete complex auditory attention/vigilance/memory tasks with 80% accuracy.    Time  6    Period  Weeks    Status  New    Target Date  09/27/19      SLP LONG TERM GOAL #4   Title  The patient will utilize compensatory strategies to remember daily tasks with 80% accuracy given min cues from the clinician.    Time  6    Period  Weeks    Status  New    Target Date  09/27/19       Plan - 08/30/19 0817    Clinical Impression Statement  Patient reports she has attempted to utilize various external memory aid strategies (ie buying a new pill box, writing notes, trying to write in a journal). Patient reports difficulty remembering to write things down.Patient does well with internal memory aid usage and demonstrates good awareness of strategies she utilizes effectively.    Speech Therapy Frequency  2x / week    Duration  Other (comment)   6 weeks   Treatment/Interventions  Compensatory strategies;Functional tasks;Patient/family education;Cognitive reorganization;Compensatory  techniques;Internal/external aids    Potential Considerations  Ability to learn/carryover information;Previous level of function;Co-morbidities    Consulted and Agree with Plan of Care  Patient       Patient will benefit from skilled therapeutic intervention in order to improve the following deficits and impairments:   Cognitive communication deficit    Problem List Patient Active Problem List   Diagnosis Date Noted  .  MDD (major depressive disorder), recurrent episode, moderate ([place]) 01/11/2019  . GAD (generalized anxiety disorder) 01/11/2019  . Bereavement 01/11/2019  . Opioid use with withdrawal ([place]) 01/11/2019  . Pain syndrome, chronic 09/21/2018  . Low serum vitamin B12 09/15/2018  . Moderate episode of recurrent major depressive disorder ([place]) 09/15/2018  . Memory loss 05/12/2018  . Chronic pain of both shoulders 05/12/2018  . Osteoporosis, post-menopausal 12/02/2017  . Vitamin D deficiency, unspecified 07/22/2017  . Candidiasis of skin and nails 07/13/2017  . Menopausal symptom 07/13/2017  . Bilateral hand pain 06/26/2017  . CRP elevated 06/26/2017  . Jaw pain, non-TMJ 06/26/2017  . Temporal headache 06/26/2017  . Stiffness of shoulder joint 06/26/2017  . Screening for osteoporosis 06/26/2017  . SOBOE (shortness of breath on exertion) 06/09/2017  . Joint pain 05/20/2017  . Arthritis 05/20/2017  . History of lumbar fusion 04/23/2017  . Lumbar degenerative disc disease 04/23/2017  . Fibromyalgia 04/23/2017  . Cervicalgia 04/23/2017  . DDD (degenerative disc disease), cervical 04/23/2017  . Chronic pain syndrome 04/23/2017  . Major depressive disorder 04/23/2017  . Spinal cord stimulator status 04/23/2017  . Complete tear of right rotator cuff 04/17/2017  . Rotator cuff tendinitis, right 04/17/2017  . Rotator cuff arthropathy of both shoulders 04/12/2017  . Degenerative joint disease of cervical and lumbar spine 03/17/2017  . Generalized osteoarthritis of multiple  sites 03/17/2017  . Seasonal allergies 03/17/2017  . GERD without esophagitis 01/18/2017  . Fluid collection at surgical site 01/18/2017  . Hypothyroidism 01/18/2017  . Fatty liver 12/08/2016  . Osteoporosis 09/30/2016  . Pain in the coccyx 08/19/2016  . Delusions of parasitosis ([place]) 05/27/2016  . Essential hypertension 04/09/2016  . Chiari malformation type I ([place]) 03/24/2016  . Obesity, morbid ([place]) 03/24/2016  . Lumbosacral spondylosis without myelopathy 09/17/2015  . Chronic midline low back pain 03/28/2015  . Lumbosacral radiculitis 03/05/2015  . 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 Medication-induced delirium, acute, hyperactive 11/17/2014  . Fatigue 05/08/2014  . Lumbar post-laminectomy syndrome 05/08/2014  . Lumbar radiculopathy 05/08/2014  . Cervical radiculopathy 05/08/2014  . H/O gastric bypass 03/03/2014  . Dysphagia, oropharyngeal phase 05/18/2013  . Acquired spondylolisthesis 08/23/2012  . Nonunion of fracture 08/23/2012  . Spinal stenosis of lumbar region without neurogenic claudication 08/23/2012  . Chronic constipation 07/26/2012  . Dry eyes, bilateral 07/26/2012  . Leg cramps 07/26/2012  . Diastolic dysfunction 0000000  . Daytime somnolence 06/02/2012  . Insomnia due to mental disorder 05/11/2012  . Lumbar spondylosis 04/06/2012  . Lumbar pseudoarthrosis 04/06/2012  . Iron deficiency 01/14/2012  . Adiposis dolorosa 12/16/2011  . Chronic pain 12/16/2011  . Edema 12/16/2011  . Internal hemorrhoid 12/16/2011  . OA (osteoarthritis) 12/16/2011    Lou Miner 08/30/2019, 11:21 AM  [place] MAIN [facility] SERVICES [address] [place], [place], [address] Phone: [phone]   Fax:  [phone]   Name: Connie [name] MRN: [id number] Date of Birth: [birth year]

## 2019-08-31 ENCOUNTER — Encounter: Payer: 59 | Admitting: Speech Pathology

## 2019-08-31 ENCOUNTER — Ambulatory Visit: Payer: Managed Care, Other (non HMO) | Admitting: Speech Pathology

## 2019-09-07 ENCOUNTER — Encounter: Payer: Self-pay | Admitting: Speech Pathology

## 2019-09-07 ENCOUNTER — Other Ambulatory Visit: Payer: Self-pay

## 2019-09-07 ENCOUNTER — Encounter: Payer: 59 | Admitting: Speech Pathology

## 2019-09-07 ENCOUNTER — Ambulatory Visit: Payer: Managed Care, Other (non HMO) | Admitting: Speech Pathology

## 2019-09-07 DIAGNOSIS — R41841 Cognitive communication deficit: Secondary | ICD-10-CM

## 2019-09-07 NOTE — Therapy (Signed)
[place] MAIN [facility] SERVICES [address] [place], [place], [address] Phone: [phone]   Fax:  [phone]  Speech Language Pathology Treatment  Patient Details  Name: Connie [name] MRN: [id number] Date of Birth: [birth year] Referring Provider (SLP): Jennings Books MD   Encounter Date: 09/07/2019  End of Session - 09/07/19 1518    Visit Number  4    Number of Visits  13    Date for SLP Re-Evaluation  09/27/19    Authorization Type  [employer]    Authorization - Visit Number  4    Authorization - Number of Visits  10    SLP Start Time  [id number]    SLP Stop Time   1455    SLP Time Calculation (min)  40 min    Activity Tolerance  Patient tolerated treatment well       Past Medical History:  Diagnosis Date  . Anemia    in the past  . Anxiety   . Arthritis    all over  . Cervical spondylosis with myelopathy 2018  . Chronic pain 2019   can't stand straight, uses walker for stability  . DDD (degenerative disc disease), lumbar   . Depression   . Displacement of cervical intervertebral disc 2018  . Dyspnea    HANDICAPPED  . Fibromyalgia    Polymyalgia Rheumatica  . GERD (gastroesophageal reflux disease)   . Heart murmur    not treated or being followed  . Hypertension    CONTROLLED  . Hypothyroidism   . Lumbar post-laminectomy syndrome 2018  . Lumbosacral radiculitis 2018  . Neuropathy due to medical condition (HCC)    feet, toes, fingers  . Osteoarthritis   . Osteoporosis   . Other long term (current) drug therapy 2019   from pain management  . Pain in the coccyx 2018   disorder of sacrum  . Spondylolisthesis 2018  . Spondylosis of lumbosacral region without myelopathy or radiculopathy 2018  . Spondylosis with myelopathy, lumbar region 2018  . Vitamin D deficiency     Past Surgical History:  Procedure Laterality Date  . ABDOMINAL HYSTERECTOMY  2000  . APPENDECTOMY    . arm surgery  2002  . BREAST BIOPSY Left 2005    benign  . CATARACT EXTRACTION W/PHACO Right 10/21/2017   Procedure: CATARACT EXTRACTION PHACO AND INTRAOCULAR LENS PLACEMENT ([place]) RIGHT;  Surgeon: Leandrew Koyanagi, MD;  Location: [place];  Service: Ophthalmology;  Laterality: Right;  neck issues  . CESAREAN SECTION     x 2  . CHOLECYSTECTOMY    . COLONOSCOPY WITH PROPOFOL N/A 08/25/2017   Procedure: COLONOSCOPY WITH PROPOFOL;  Surgeon: Toledo, Benay Pike, MD;  Location: [facility];  Service: Gastroenterology;  Laterality: N/A;  . epidural injection  2018   steroids  . GASTRIC BYPASS  2004  . JOINT REPLACEMENT Bilateral 2005   knees  . LUMBAR FUSION  2016   x 2/ L3-5  . SHOULDER ARTHROSCOPY WITH ROTATOR CUFF REPAIR Right 2008   x 3  . SPINAL CORD STIMULATOR IMPLANT  2018   [facility] scientific  . TEMPORAL ARTERY BIOPSY / LIGATION    . TONSILLECTOMY      There were no vitals filed for this visit.  Subjective Assessment - 09/07/19 1516    Subjective  Patient was pleasant and engaged            ADULT SLP TREATMENT - 09/07/19 0001      General Information  Behavior/Cognition  Cooperative;Pleasant mood;Confused      Treatment Provided   Treatment provided  Cognitive-Linquistic      Pain Assessment   Pain Assessment  No/denies pain      Cognitive-Linquistic Treatment   Treatment focused on  Cognition    Skilled Treatment  IDENTIFY COGNITIVE-COMMUNICATION BARRIERS AND DEVELOP STRATEGIES: Patient reports that she is using the new pill box but cannot remember to write in her journal.   She cites "I don't do anything to remember" and simply forgetting to write in the journal.  She was able to propose a solution (put a sticky note on her phone).  WORD FINDING: Name category given 3 members with 90% accuracy.  Complete simple verbal analogies given min cues ("No, that's not the best answer"). Complete Rebus Puzzles given min cues.      Assessment / Recommendations / Plan   Plan  Continue with current plan of  care      Progression Toward Goals   Progression toward goals  Progressing toward goals       SLP Education - 09/07/19 1517    Education Details  some of her cognitive-communication difficulties may stem from psychiatric issues and/or chronic pain    Person(s) Educated  Patient    Methods  Explanation    Comprehension  Verbalized understanding         SLP Long Term Goals - 08/16/19 0900      SLP LONG TERM GOAL #1   Title  Patient will identify cognitive-communication barriers and participate in developing functional compensatory strategies.    Time  6    Period  Weeks    Status  New    Target Date  09/27/19      SLP LONG TERM GOAL #2   Title  Patient will demonstrate oral/written comprehension for abstract/complex verbal/written information via grammatical, fluent, and cogent sentences to complete abstract/complex linguistic tasks with 80% accuracy.    Time  6    Period  Weeks    Status  New    Target Date  09/27/19      SLP LONG TERM GOAL #3   Title  Patient will complete complex auditory attention/vigilance/memory tasks with 80% accuracy.    Time  6    Period  Weeks    Status  New    Target Date  09/27/19      SLP LONG TERM GOAL #4   Title  The patient will utilize compensatory strategies to remember daily tasks with 80% accuracy given min cues from the clinician.    Time  6    Period  Weeks    Status  New    Target Date  09/27/19       Plan - 09/07/19 1519    Clinical Impression Statement  Patient reports she has attempted to utilize various external memory aid strategies (ie buying a new pill box, writing notes, trying to write in a journal). Patient reports difficulty remembering to write things down.Patient does well with internal memory aid usage and demonstrates good awareness of strategies she utilizes effectively.    Speech Therapy Frequency  2x / week    Duration  Other (comment)    Treatment/Interventions  Compensatory strategies;Functional  tasks;Patient/family education;Cognitive reorganization;Compensatory techniques;Internal/external aids    Potential to Achieve Goals  Good    Potential Considerations  Ability to learn/carryover information;Previous level of function;Co-morbidities    Consulted and Agree with Plan of Care  Patient       Patient  will benefit from skilled therapeutic intervention in order to improve the following deficits and impairments:   Cognitive communication deficit    Problem List Patient Active Problem List   Diagnosis Date Noted  . MDD (major depressive disorder), recurrent episode, moderate ([place]) 01/11/2019  . GAD (generalized anxiety disorder) 01/11/2019  . Bereavement 01/11/2019  . Opioid use with withdrawal ([place]) 01/11/2019  . Pain syndrome, chronic 09/21/2018  . Low serum vitamin B12 09/15/2018  . Moderate episode of recurrent major depressive disorder ([place]) 09/15/2018  . Memory loss 05/12/2018  . Chronic pain of both shoulders 05/12/2018  . Osteoporosis, post-menopausal 12/02/2017  . Vitamin D deficiency, unspecified 07/22/2017  . Candidiasis of skin and nails 07/13/2017  . Menopausal symptom 07/13/2017  . Bilateral hand pain 06/26/2017  . CRP elevated 06/26/2017  . Jaw pain, non-TMJ 06/26/2017  . Temporal headache 06/26/2017  . Stiffness of shoulder joint 06/26/2017  . Screening for osteoporosis 06/26/2017  . SOBOE (shortness of breath on exertion) 06/09/2017  . Joint pain 05/20/2017  . Arthritis 05/20/2017  . History of lumbar fusion 04/23/2017  . Lumbar degenerative disc disease 04/23/2017  . Fibromyalgia 04/23/2017  . Cervicalgia 04/23/2017  . DDD (degenerative disc disease), cervical 04/23/2017  . Chronic pain syndrome 04/23/2017  . Major depressive disorder 04/23/2017  . Spinal cord stimulator status 04/23/2017  . Complete tear of right rotator cuff 04/17/2017  . Rotator cuff tendinitis, right 04/17/2017  . Rotator cuff arthropathy of both shoulders 04/12/2017  .  Degenerative joint disease of cervical and lumbar spine 03/17/2017  . Generalized osteoarthritis of multiple sites 03/17/2017  . Seasonal allergies 03/17/2017  . GERD without esophagitis 01/18/2017  . Fluid collection at surgical site 01/18/2017  . Hypothyroidism 01/18/2017  . Fatty liver 12/08/2016  . Osteoporosis 09/30/2016  . Pain in the coccyx 08/19/2016  . Delusions of parasitosis ([place]) 05/27/2016  . Essential hypertension 04/09/2016  . Chiari malformation type I ([place]) 03/24/2016  . Obesity, morbid ([place]) 03/24/2016  . Lumbosacral spondylosis without myelopathy 09/17/2015  . Chronic midline low back pain 03/28/2015  . Lumbosacral radiculitis 03/05/2015  . 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 Medication-induced delirium, acute, hyperactive 11/17/2014  . Fatigue 05/08/2014  . Lumbar post-laminectomy syndrome 05/08/2014  . Lumbar radiculopathy 05/08/2014  . Cervical radiculopathy 05/08/2014  . H/O gastric bypass 03/03/2014  . Dysphagia, oropharyngeal phase 05/18/2013  . Acquired spondylolisthesis 08/23/2012  . Nonunion of fracture 08/23/2012  . Spinal stenosis of lumbar region without neurogenic claudication 08/23/2012  . Chronic constipation 07/26/2012  . Dry eyes, bilateral 07/26/2012  . Leg cramps 07/26/2012  . Diastolic dysfunction 0000000  . Daytime somnolence 06/02/2012  . Insomnia due to mental disorder 05/11/2012  . Lumbar spondylosis 04/06/2012  . Lumbar pseudoarthrosis 04/06/2012  . Iron deficiency 01/14/2012  . Adiposis dolorosa 12/16/2011  . Chronic pain 12/16/2011  . Edema 12/16/2011  . Internal hemorrhoid 12/16/2011  . OA (osteoarthritis) 12/16/2011   [name], MS/CCC- SLP  Lou Miner 09/07/2019, 3:20 PM  [place] MAIN [facility] SERVICES [address] [place], [place], [address] Phone: [phone]   Fax:  [phone]   Name: Connie [name] MRN: [id number] Date of Birth: 1955/05/11

## 2019-09-12 ENCOUNTER — Other Ambulatory Visit: Payer: Self-pay

## 2019-09-12 ENCOUNTER — Encounter: Payer: Self-pay | Admitting: Speech Pathology

## 2019-09-12 ENCOUNTER — Ambulatory Visit: Payer: Managed Care, Other (non HMO) | Admitting: Speech Pathology

## 2019-09-12 ENCOUNTER — Encounter: Payer: 59 | Admitting: Speech Pathology

## 2019-09-12 DIAGNOSIS — R41841 Cognitive communication deficit: Secondary | ICD-10-CM | POA: Diagnosis not present

## 2019-09-12 NOTE — Therapy (Signed)
[place] MAIN [facility] SERVICES [address] [place], [place], [address] Phone: [phone]   Fax:  [phone]  Speech Language Pathology Treatment  Patient Details  Name: Connie [name] MRN: [id number] Date of Birth: [birth year] Referring Provider (SLP): Jennings Books MD   Encounter Date: 09/12/2019  End of Session - 09/12/19 1535    Visit Number  5    Number of Visits  13    Date for SLP Re-Evaluation  09/27/19    Authorization Type  Cigna    Authorization - Visit Number  5    Authorization - Number of Visits  10    SLP Start Time  1400    SLP Stop Time   1450    SLP Time Calculation (min)  50 min    Activity Tolerance  Patient tolerated treatment well       Past Medical History:  Diagnosis Date  . Anemia    in the past  . Anxiety   . Arthritis    all over  . Cervical spondylosis with myelopathy 2018  . Chronic pain 2019   can't stand straight, uses walker for stability  . DDD (degenerative disc disease), lumbar   . Depression   . Displacement of cervical intervertebral disc 2018  . Dyspnea    HANDICAPPED  . Fibromyalgia    Polymyalgia Rheumatica  . GERD (gastroesophageal reflux disease)   . Heart murmur    not treated or being followed  . Hypertension    CONTROLLED  . Hypothyroidism   . Lumbar post-laminectomy syndrome 2018  . Lumbosacral radiculitis 2018  . Neuropathy due to medical condition (HCC)    feet, toes, fingers  . Osteoarthritis   . Osteoporosis   . Other long term (current) drug therapy 2019   from pain management  . Pain in the coccyx 2018   disorder of sacrum  . Spondylolisthesis 2018  . Spondylosis of lumbosacral region without myelopathy or radiculopathy 2018  . Spondylosis with myelopathy, lumbar region 2018  . Vitamin D deficiency     Past Surgical History:  Procedure Laterality Date  . ABDOMINAL HYSTERECTOMY  2000  . APPENDECTOMY    . arm surgery  2002  . BREAST BIOPSY Left 2005    benign  . CATARACT EXTRACTION W/PHACO Right 10/21/2017   Procedure: CATARACT EXTRACTION PHACO AND INTRAOCULAR LENS PLACEMENT ([place]) RIGHT;  Surgeon: Leandrew Koyanagi, MD;  Location: [place];  Service: Ophthalmology;  Laterality: Right;  neck issues  . CESAREAN SECTION     x 2  . CHOLECYSTECTOMY    . COLONOSCOPY WITH PROPOFOL N/A 08/25/2017   Procedure: COLONOSCOPY WITH PROPOFOL;  Surgeon: Toledo, Benay Pike, MD;  Location: [facility];  Service: Gastroenterology;  Laterality: N/A;  . epidural injection  2018   steroids  . GASTRIC BYPASS  2004  . JOINT REPLACEMENT Bilateral 2005   knees  . LUMBAR FUSION  2016   x 2/ L3-5  . SHOULDER ARTHROSCOPY WITH ROTATOR CUFF REPAIR Right 2008   x 3  . SPINAL CORD STIMULATOR IMPLANT  2018   [facility] scientific  . TEMPORAL ARTERY BIOPSY / LIGATION    . TONSILLECTOMY      There were no vitals filed for this visit.  Subjective Assessment - 09/12/19 1535    Subjective  Patient was pleasant and engaged            ADULT SLP TREATMENT - 09/12/19 0001      General Information  Behavior/Cognition  Cooperative;Pleasant mood;Confused      Treatment Provided   Treatment provided  Cognitive-Linquistic      Pain Assessment   Pain Assessment  No/denies pain      Cognitive-Linquistic Treatment   Treatment focused on  Cognition    Skilled Treatment  IDENTIFY COGNITIVE-COMMUNICATION BARRIERS AND DEVELOP STRATEGIES: Patient reports that she is using the new pill box but cannot remember to write in her journal.   She cites "I don't do anything to remember" and that she is too tired.  Patient was given a written handout RE: value of journaling and prompts for ideas RE: what to journal.  WORD FINDING: Complete category grid with min cues.  Name word given written definition with 80% accuracy.  complete deductive puzzle with min cues.      Assessment / Recommendations / Plan   Plan  Continue with current plan of care      Progression  Toward Goals   Progression toward goals  Progressing toward goals       SLP Education - 09/12/19 1535    Education Details  attend to task to improve performance    Person(s) Educated  Patient    Methods  Explanation    Comprehension  Verbalized understanding         SLP Long Term Goals - 08/16/19 0900      SLP LONG TERM GOAL #1   Title  Patient will identify cognitive-communication barriers and participate in developing functional compensatory strategies.    Time  6    Period  Weeks    Status  New    Target Date  09/27/19      SLP LONG TERM GOAL #2   Title  Patient will demonstrate oral/written comprehension for abstract/complex verbal/written information via grammatical, fluent, and cogent sentences to complete abstract/complex linguistic tasks with 80% accuracy.    Time  6    Period  Weeks    Status  New    Target Date  09/27/19      SLP LONG TERM GOAL #3   Title  Patient will complete complex auditory attention/vigilance/memory tasks with 80% accuracy.    Time  6    Period  Weeks    Status  New    Target Date  09/27/19      SLP LONG TERM GOAL #4   Title  The patient will utilize compensatory strategies to remember daily tasks with 80% accuracy given min cues from the clinician.    Time  6    Period  Weeks    Status  New    Target Date  09/27/19       Plan - 09/12/19 1536    Clinical Impression Statement  Patient reports she has attempted to utilize various external memory aid strategies (ie buying a new pill box, writing notes, trying to write in a journal). Patient reports difficulty remembering to write things down.Patient does well with internal memory aid usage and demonstrates good awareness of strategies she utilizes effectively.    Speech Therapy Frequency  2x / week    Duration  Other (comment)    Treatment/Interventions  Compensatory strategies;Functional tasks;Patient/family education;Cognitive reorganization;Compensatory techniques;Internal/external  aids    Potential to Achieve Goals  Good    Potential Considerations  Ability to learn/carryover information;Previous level of function;Co-morbidities    Consulted and Agree with Plan of Care  Patient       Patient will benefit from skilled therapeutic intervention in order to improve the  following deficits and impairments:   Cognitive communication deficit    Problem List Patient Active Problem List   Diagnosis Date Noted  . MDD (major depressive disorder), recurrent episode, moderate ([place]) 01/11/2019  . GAD (generalized anxiety disorder) 01/11/2019  . Bereavement 01/11/2019  . Opioid use with withdrawal ([place]) 01/11/2019  . Pain syndrome, chronic 09/21/2018  . Low serum vitamin B12 09/15/2018  . Moderate episode of recurrent major depressive disorder ([place]) 09/15/2018  . Memory loss 05/12/2018  . Chronic pain of both shoulders 05/12/2018  . Osteoporosis, post-menopausal 12/02/2017  . Vitamin D deficiency, unspecified 07/22/2017  . Candidiasis of skin and nails 07/13/2017  . Menopausal symptom 07/13/2017  . Bilateral hand pain 06/26/2017  . CRP elevated 06/26/2017  . Jaw pain, non-TMJ 06/26/2017  . Temporal headache 06/26/2017  . Stiffness of shoulder joint 06/26/2017  . Screening for osteoporosis 06/26/2017  . SOBOE (shortness of breath on exertion) 06/09/2017  . Joint pain 05/20/2017  . Arthritis 05/20/2017  . History of lumbar fusion 04/23/2017  . Lumbar degenerative disc disease 04/23/2017  . Fibromyalgia 04/23/2017  . Cervicalgia 04/23/2017  . DDD (degenerative disc disease), cervical 04/23/2017  . Chronic pain syndrome 04/23/2017  . Major depressive disorder 04/23/2017  . Spinal cord stimulator status 04/23/2017  . Complete tear of right rotator cuff 04/17/2017  . Rotator cuff tendinitis, right 04/17/2017  . Rotator cuff arthropathy of both shoulders 04/12/2017  . Degenerative joint disease of cervical and lumbar spine 03/17/2017  . Generalized osteoarthritis of  multiple sites 03/17/2017  . Seasonal allergies 03/17/2017  . GERD without esophagitis 01/18/2017  . Fluid collection at surgical site 01/18/2017  . Hypothyroidism 01/18/2017  . Fatty liver 12/08/2016  . Osteoporosis 09/30/2016  . Pain in the coccyx 08/19/2016  . Delusions of parasitosis ([place]) 05/27/2016  . Essential hypertension 04/09/2016  . Chiari malformation type I ([place]) 03/24/2016  . Obesity, morbid ([place]) 03/24/2016  . Lumbosacral spondylosis without myelopathy 09/17/2015  . Chronic midline low back pain 03/28/2015  . Lumbosacral radiculitis 03/05/2015  . 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 Medication-induced delirium, acute, hyperactive 11/17/2014  . Fatigue 05/08/2014  . Lumbar post-laminectomy syndrome 05/08/2014  . Lumbar radiculopathy 05/08/2014  . Cervical radiculopathy 05/08/2014  . H/O gastric bypass 03/03/2014  . Dysphagia, oropharyngeal phase 05/18/2013  . Acquired spondylolisthesis 08/23/2012  . Nonunion of fracture 08/23/2012  . Spinal stenosis of lumbar region without neurogenic claudication 08/23/2012  . Chronic constipation 07/26/2012  . Dry eyes, bilateral 07/26/2012  . Leg cramps 07/26/2012  . Diastolic dysfunction 0000000  . Daytime somnolence 06/02/2012  . Insomnia due to mental disorder 05/11/2012  . Lumbar spondylosis 04/06/2012  . Lumbar pseudoarthrosis 04/06/2012  . Iron deficiency 01/14/2012  . Adiposis dolorosa 12/16/2011  . Chronic pain 12/16/2011  . Edema 12/16/2011  . Internal hemorrhoid 12/16/2011  . OA (osteoarthritis) 12/16/2011   [name], MS/CCC- SLP  Lou Miner 09/12/2019, 3:36 PM  [place] MAIN [facility] SERVICES [address] [place], [place], [address] Phone: [phone]   Fax:  [phone]   Name: Connie [name] MRN: [id number] Date of Birth: 06-22-1955

## 2019-09-14 ENCOUNTER — Encounter: Payer: Self-pay | Admitting: Speech Pathology

## 2019-09-14 ENCOUNTER — Other Ambulatory Visit: Payer: Self-pay

## 2019-09-14 ENCOUNTER — Ambulatory Visit: Payer: Managed Care, Other (non HMO) | Admitting: Speech Pathology

## 2019-09-14 ENCOUNTER — Encounter: Payer: 59 | Admitting: Speech Pathology

## 2019-09-14 DIAGNOSIS — R41841 Cognitive communication deficit: Secondary | ICD-10-CM | POA: Diagnosis not present

## 2019-09-14 NOTE — Therapy (Signed)
[place] MAIN [facility] SERVICES [address] [place], [place], [address] Phone: [phone]   Fax:  [phone]  Speech Language Pathology Treatment  Patient Details  Name: Connie [name] MRN: [id number] Date of Birth: Jun 19, 1955 Referring Provider (SLP): Jennings Books MD   Encounter Date: 09/14/2019  End of Session - 09/14/19 1618    Visit Number  6    Number of Visits  13    Date for SLP Re-Evaluation  09/27/19    Authorization Type  [employer]    Authorization - Visit Number  6    Authorization - Number of Visits  10    SLP Start Time  1400    SLP Stop Time   1450    SLP Time Calculation (min)  50 min    Activity Tolerance  Patient tolerated treatment well       Past Medical History:  Diagnosis Date  . Anemia    in the past  . Anxiety   . Arthritis    all over  . Cervical spondylosis with myelopathy 2018  . Chronic pain 2019   can't stand straight, uses walker for stability  . DDD (degenerative disc disease), lumbar   . Depression   . Displacement of cervical intervertebral disc 2018  . Dyspnea    HANDICAPPED  . Fibromyalgia    Polymyalgia Rheumatica  . GERD (gastroesophageal reflux disease)   . Heart murmur    not treated or being followed  . Hypertension    CONTROLLED  . Hypothyroidism   . Lumbar post-laminectomy syndrome 2018  . Lumbosacral radiculitis 2018  . Neuropathy due to medical condition (HCC)    feet, toes, fingers  . Osteoarthritis   . Osteoporosis   . Other long term (current) drug therapy 2019   from pain management  . Pain in the coccyx 2018   disorder of sacrum  . Spondylolisthesis 2018  . Spondylosis of lumbosacral region without myelopathy or radiculopathy 2018  . Spondylosis with myelopathy, lumbar region 2018  . Vitamin D deficiency     Past Surgical History:  Procedure Laterality Date  . ABDOMINAL HYSTERECTOMY  2000  . APPENDECTOMY    . arm surgery  2002  . BREAST BIOPSY Left 2005   benign  . CATARACT EXTRACTION W/PHACO Right 10/21/2017   Procedure: CATARACT EXTRACTION PHACO AND INTRAOCULAR LENS PLACEMENT ([place]) RIGHT;  Surgeon: Leandrew Koyanagi, MD;  Location: [place];  Service: Ophthalmology;  Laterality: Right;  neck issues  . CESAREAN SECTION     x 2  . CHOLECYSTECTOMY    . COLONOSCOPY WITH PROPOFOL N/A 08/25/2017   Procedure: COLONOSCOPY WITH PROPOFOL;  Surgeon: Toledo, Benay Pike, MD;  Location: [facility];  Service: Gastroenterology;  Laterality: N/A;  . epidural injection  2018   steroids  . GASTRIC BYPASS  2004  . JOINT REPLACEMENT Bilateral 2005   knees  . LUMBAR FUSION  2016   x 2/ L3-5  . SHOULDER ARTHROSCOPY WITH ROTATOR CUFF REPAIR Right 2008   x 3  . SPINAL CORD STIMULATOR IMPLANT  2018   [facility] scientific  . TEMPORAL ARTERY BIOPSY / LIGATION    . TONSILLECTOMY      There were no vitals filed for this visit.  Subjective Assessment - 09/14/19 1617    Subjective  Patient was pleasant and engaged            ADULT SLP TREATMENT - 09/14/19 0001      General Information  Behavior/Cognition  Cooperative;Pleasant mood;Confused      Treatment Provided   Treatment provided  Cognitive-Linquistic      Pain Assessment   Pain Assessment  No/denies pain      Cognitive-Linquistic Treatment   Treatment focused on  Cognition    Skilled Treatment  IDENTIFY COGNITIVE-COMMUNICATION BARRIERS AND DEVELOP STRATEGIES: Patient reports that she is using the new pill box but cannot remember to write in her journal.   She cites "I don't do anything to remember" and that she is too tired.  Patient was given a written handout RE: value of journaling and prompts for ideas RE: what to journal.  She still is not using the notebook as a journal, but reports writing lists and notes to herself. WORD FINDING: Complete category grid with min cues.  Name word given written definition with 80% accuracy.  Complete deductive puzzle with min cues.  VISUOSPATIAL SKILLS: Complete figural analogies with min cues to recall instructions.       Assessment / Recommendations / Plan   Plan  Continue with current plan of care      Progression Toward Goals   Progression toward goals  Progressing toward goals       SLP Education - 09/14/19 1618    Education Details  variety of puzzles and activities is more stimulating than a single type    Person(s) Educated  Patient    Methods  Explanation    Comprehension  Verbalized understanding         SLP Long Term Goals - 08/16/19 0900      SLP LONG TERM GOAL #1   Title  Patient will identify cognitive-communication barriers and participate in developing functional compensatory strategies.    Time  6    Period  Weeks    Status  New    Target Date  09/27/19      SLP LONG TERM GOAL #2   Title  Patient will demonstrate oral/written comprehension for abstract/complex verbal/written information via grammatical, fluent, and cogent sentences to complete abstract/complex linguistic tasks with 80% accuracy.    Time  6    Period  Weeks    Status  New    Target Date  09/27/19      SLP LONG TERM GOAL #3   Title  Patient will complete complex auditory attention/vigilance/memory tasks with 80% accuracy.    Time  6    Period  Weeks    Status  New    Target Date  09/27/19      SLP LONG TERM GOAL #4   Title  The patient will utilize compensatory strategies to remember daily tasks with 80% accuracy given min cues from the clinician.    Time  6    Period  Weeks    Status  New    Target Date  09/27/19       Plan - 09/14/19 1619    Clinical Impression Statement  Patient reports she has attempted to utilize various external memory aid strategies (ie buying a new pill box, writing notes, trying to write in a journal). Patient reports difficulty remembering to write things down.Patient does well with internal memory aid usage and demonstrates good awareness of strategies she utilizes effectively.     Speech Therapy Frequency  2x / week    Duration  Other (comment)    Treatment/Interventions  Compensatory strategies;Functional tasks;Patient/family education;Cognitive reorganization;Compensatory techniques;Internal/external aids    Potential to Achieve Goals  Good    Potential Considerations  Ability  to learn/carryover information;Previous level of function;Co-morbidities    Consulted and Agree with Plan of Care  Patient       Patient will benefit from skilled therapeutic intervention in order to improve the following deficits and impairments:   Cognitive communication deficit    Problem List Patient Active Problem List   Diagnosis Date Noted  . MDD (major depressive disorder), recurrent episode, moderate ([place]) 01/11/2019  . GAD (generalized anxiety disorder) 01/11/2019  . Bereavement 01/11/2019  . Opioid use with withdrawal ([place]) 01/11/2019  . Pain syndrome, chronic 09/21/2018  . Low serum vitamin B12 09/15/2018  . Moderate episode of recurrent major depressive disorder ([place]) 09/15/2018  . Memory loss 05/12/2018  . Chronic pain of both shoulders 05/12/2018  . Osteoporosis, post-menopausal 12/02/2017  . Vitamin D deficiency, unspecified 07/22/2017  . Candidiasis of skin and nails 07/13/2017  . Menopausal symptom 07/13/2017  . Bilateral hand pain 06/26/2017  . CRP elevated 06/26/2017  . Jaw pain, non-TMJ 06/26/2017  . Temporal headache 06/26/2017  . Stiffness of shoulder joint 06/26/2017  . Screening for osteoporosis 06/26/2017  . SOBOE (shortness of breath on exertion) 06/09/2017  . Joint pain 05/20/2017  . Arthritis 05/20/2017  . History of lumbar fusion 04/23/2017  . Lumbar degenerative disc disease 04/23/2017  . Fibromyalgia 04/23/2017  . Cervicalgia 04/23/2017  . DDD (degenerative disc disease), cervical 04/23/2017  . Chronic pain syndrome 04/23/2017  . Major depressive disorder 04/23/2017  . Spinal cord stimulator status 04/23/2017  . Complete tear of right  rotator cuff 04/17/2017  . Rotator cuff tendinitis, right 04/17/2017  . Rotator cuff arthropathy of both shoulders 04/12/2017  . Degenerative joint disease of cervical and lumbar spine 03/17/2017  . Generalized osteoarthritis of multiple sites 03/17/2017  . Seasonal allergies 03/17/2017  . GERD without esophagitis 01/18/2017  . Fluid collection at surgical site 01/18/2017  . Hypothyroidism 01/18/2017  . Fatty liver 12/08/2016  . Osteoporosis 09/30/2016  . Pain in the coccyx 08/19/2016  . Delusions of parasitosis ([place]) 05/27/2016  . Essential hypertension 04/09/2016  . Chiari malformation type I ([place]) 03/24/2016  . Obesity, morbid ([place]) 03/24/2016  . Lumbosacral spondylosis without myelopathy 09/17/2015  . Chronic midline low back pain 03/28/2015  . Lumbosacral radiculitis 03/05/2015  . 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 Medication-induced delirium, acute, hyperactive 11/17/2014  . Fatigue 05/08/2014  . Lumbar post-laminectomy syndrome 05/08/2014  . Lumbar radiculopathy 05/08/2014  . Cervical radiculopathy 05/08/2014  . H/O gastric bypass 03/03/2014  . Dysphagia, oropharyngeal phase 05/18/2013  . Acquired spondylolisthesis 08/23/2012  . Nonunion of fracture 08/23/2012  . Spinal stenosis of lumbar region without neurogenic claudication 08/23/2012  . Chronic constipation 07/26/2012  . Dry eyes, bilateral 07/26/2012  . Leg cramps 07/26/2012  . Diastolic dysfunction 0000000  . Daytime somnolence 06/02/2012  . Insomnia due to mental disorder 05/11/2012  . Lumbar spondylosis 04/06/2012  . Lumbar pseudoarthrosis 04/06/2012  . Iron deficiency 01/14/2012  . Adiposis dolorosa 12/16/2011  . Chronic pain 12/16/2011  . Edema 12/16/2011  . Internal hemorrhoid 12/16/2011  . OA (osteoarthritis) 12/16/2011   [name], MS/CCC- SLP  Lou Miner 09/14/2019, 4:20 PM  [place] MAIN [facility] SERVICES [address] [place], [place],  [address] Phone: [phone]   Fax:  [phone]   Name: Sinda Yanagi MRN: [id number] Date of Birth: 12/19/1955

## 2019-09-16 ENCOUNTER — Encounter: Payer: Self-pay | Admitting: Student in an Organized Health Care Education/Training Program

## 2019-09-18 ENCOUNTER — Other Ambulatory Visit: Payer: Self-pay | Admitting: Psychiatry

## 2019-09-18 ENCOUNTER — Other Ambulatory Visit: Payer: Self-pay | Admitting: Student in an Organized Health Care Education/Training Program

## 2019-09-18 DIAGNOSIS — F331 Major depressive disorder, recurrent, moderate: Secondary | ICD-10-CM

## 2019-09-18 DIAGNOSIS — F3341 Major depressive disorder, recurrent, in partial remission: Secondary | ICD-10-CM

## 2019-09-18 DIAGNOSIS — M797 Fibromyalgia: Secondary | ICD-10-CM

## 2019-09-18 DIAGNOSIS — M5136 Other intervertebral disc degeneration, lumbar region: Secondary | ICD-10-CM

## 2019-09-18 DIAGNOSIS — F5105 Insomnia due to other mental disorder: Secondary | ICD-10-CM

## 2019-09-19 ENCOUNTER — Encounter: Payer: 59 | Admitting: Speech Pathology

## 2019-09-19 ENCOUNTER — Ambulatory Visit: Payer: Managed Care, Other (non HMO) | Admitting: Speech Pathology

## 2019-09-19 NOTE — Telephone Encounter (Signed)
I have sent trazodone to pharmacy. 

## 2019-09-21 ENCOUNTER — Encounter: Payer: 59 | Admitting: Speech Pathology

## 2019-09-21 ENCOUNTER — Ambulatory Visit: Payer: Managed Care, Other (non HMO) | Admitting: Speech Pathology

## 2019-09-28 ENCOUNTER — Encounter: Payer: 59 | Admitting: Speech Pathology

## 2019-09-28 ENCOUNTER — Ambulatory Visit: Payer: Managed Care, Other (non HMO) | Admitting: Speech Pathology

## 2019-10-02 ENCOUNTER — Other Ambulatory Visit: Payer: Self-pay | Admitting: Psychiatry

## 2019-10-02 DIAGNOSIS — F3341 Major depressive disorder, recurrent, in partial remission: Secondary | ICD-10-CM

## 2019-10-05 ENCOUNTER — Ambulatory Visit
Payer: Managed Care, Other (non HMO) | Attending: Student in an Organized Health Care Education/Training Program | Admitting: Student in an Organized Health Care Education/Training Program

## 2019-10-05 ENCOUNTER — Other Ambulatory Visit: Payer: Self-pay

## 2019-10-05 ENCOUNTER — Encounter: Payer: Self-pay | Admitting: Student in an Organized Health Care Education/Training Program

## 2019-10-05 VITALS — BP 147/70 | HR 60 | Temp 97.4°F | Ht 60.0 in | Wt 220.0 lb

## 2019-10-05 DIAGNOSIS — M47816 Spondylosis without myelopathy or radiculopathy, lumbar region: Secondary | ICD-10-CM

## 2019-10-05 DIAGNOSIS — M51369 Other intervertebral disc degeneration, lumbar region without mention of lumbar back pain or lower extremity pain: Secondary | ICD-10-CM

## 2019-10-05 DIAGNOSIS — M5030 Other cervical disc degeneration, unspecified cervical region: Secondary | ICD-10-CM | POA: Diagnosis present

## 2019-10-05 DIAGNOSIS — G894 Chronic pain syndrome: Secondary | ICD-10-CM

## 2019-10-05 DIAGNOSIS — Z981 Arthrodesis status: Secondary | ICD-10-CM

## 2019-10-05 DIAGNOSIS — M5136 Other intervertebral disc degeneration, lumbar region: Secondary | ICD-10-CM | POA: Diagnosis not present

## 2019-10-05 DIAGNOSIS — M5416 Radiculopathy, lumbar region: Secondary | ICD-10-CM | POA: Diagnosis present

## 2019-10-05 DIAGNOSIS — Z9689 Presence of other specified functional implants: Secondary | ICD-10-CM | POA: Diagnosis present

## 2019-10-05 DIAGNOSIS — M797 Fibromyalgia: Secondary | ICD-10-CM | POA: Diagnosis present

## 2019-10-05 MED ORDER — GABAPENTIN 300 MG PO CAPS: 300.0000 mg | ORAL_CAPSULE | Freq: Two times a day (BID) | ORAL | 5 refills | Status: DC

## 2019-10-05 MED ORDER — LEVORPHANOL TARTRATE 2 MG PO TABS: ORAL_TABLET | ORAL | 0 refills | Status: AC

## 2019-10-05 MED ORDER — LEVORPHANOL TARTRATE 2 MG PO TABS: ORAL_TABLET | ORAL | 0 refills | Status: DC

## 2019-10-05 NOTE — Progress Notes (Signed)
Safety precautions to be maintained throughout the outpatient stay will include: orient to surroundings, keep bed in low position, maintain call bell within reach at all times, provide assistance with transfer out of bed and ambulation.  

## 2019-10-05 NOTE — Progress Notes (Signed)
PROVIDER NOTE: Information contained herein reflects review and annotations entered in association with encounter. Interpretation of such information and data should be left to medically-trained personnel. Information provided to patient can be located elsewhere in the medical record under "Patient Instructions". Document created using STT-dictation technology, any transcriptional errors that may result from process are unintentional.    Patient: Connie [name]  Service Category: E/M  Provider: [name], MD  DOB: 1956-04-16  DOS: 10/05/2019  Specialty: Interventional Pain Management  MRN: [id number]  Setting: Ambulatory outpatient  PCP: Tracie Harrier, MD  Type: Established Patient    Referring Provider: Tracie Harrier, MD  Location: Office  Delivery: Face-to-face     HPI  Reason for encounter: [name], a 64 y.o. year old female, is here today for evaluation and management of her History of lumbar fusion [Z98.1]. [name] primary complain today is Back Pain Last encounter: Practice (09/18/2019). My last encounter with her was on 09/18/2019. Pertinent problems: [name] has Degenerative joint disease of cervical and lumbar spine; Lumbar spondylosis; History of lumbar fusion; Lumbar degenerative disc disease; Fibromyalgia; Chronic pain syndrome; Spinal cord stimulator status; Acquired spondylolisthesis; Chronic pain; Generalized osteoarthritis of multiple sites; Spinal stenosis of lumbar region without neurogenic claudication; and Lumbar post-laminectomy syndrome on their pertinent problem list. Pain Assessment: Severity of Chronic pain is reported as a 4 /10. Location: Generalized(back) Lower, Right, Left, Mid/pain radiaties everywhere, more on the left leg, more numbness in right foot. Onset: More than a month ago. Quality: Aching, Burning, Constant, Spasm, Radiating, Sharp, Shooting, Stabbing, Throbbing, Tingling. Timing: Constant. Modifying factor(s):  Marland Kitchen Vitals:  height is  5' (1.524 m) and weight is 220 lb (99.8 kg). Her temperature is 97.4 F (36.3 C) (abnormal). Her blood pressure is 147/70 (abnormal) and her pulse is 60. Her oxygen saturation is 96%.   Patient presents today for medication management.  She has seen neurology in the interim.  She is having issues with sedation and memory recall.  She has been referred to [facility] memory clinic.  Dr. Manuella Ghazi recommended that she increase her gabapentin to 600 mg twice daily but I informed the patient that we should probably remain at her current dose 300 mg twice a day if there is concern of sedation.  Increasing gabapentin could increase her risk of sedation.  We will also refill her levorphanol as below.  ROS  Constitutional: Denies any fever or chills Gastrointestinal: No reported hemesis, hematochezia, vomiting, or acute GI distress Musculoskeletal: Denies any acute onset joint swelling, redness, loss of ROM, or weakness Neurological: No reported episodes of acute onset apraxia, aphasia, dysarthria, agnosia, amnesia, paralysis, loss of coordination, or loss of consciousness  Medication Review  azelastine, buPROPion, cefdinir, celecoxib, citalopram, esomeprazole, furosemide, gabapentin, glycopyrrolate, hydrOXYzine, ibuprofen, levofloxacin, levorphanol, levothyroxine, loratadine, metoprolol succinate, ondansetron, pantoprazole, predniSONE, traZODone, and vitamin B-12  History Review  Allergy: [name] is allergic to tape. Drug: [name]  reports no history of drug use. Alcohol:  reports no history of alcohol use. Tobacco:  reports that she quit smoking about 12 years ago. Her smoking use included cigarettes. She has a 10.00 pack-year smoking history. She has never used smokeless tobacco. Social: [name]  reports that she quit smoking about 12 years ago. Her smoking use included cigarettes. She has a 10.00 pack-year smoking history. She has never used smokeless tobacco. She reports that she does not drink alcohol  or use drugs. Medical:  has a past medical history of Anemia, Anxiety, Arthritis, Cervical  spondylosis with myelopathy (2018), Chronic pain (2019), DDD (degenerative disc disease), lumbar, Depression, Displacement of cervical intervertebral disc (2018), Dyspnea, Fibromyalgia, GERD (gastroesophageal reflux disease), Heart murmur, Hypertension, Hypothyroidism, Lumbar post-laminectomy syndrome (2018), Lumbosacral radiculitis (2018), Neuropathy due to medical condition ([place]), Osteoarthritis, Osteoporosis, Other long term (current) drug therapy (2019), Pain in the coccyx (2018), Spondylolisthesis (2018), Spondylosis of lumbosacral region without myelopathy or radiculopathy (2018), Spondylosis with myelopathy, lumbar region (2018), and Vitamin D deficiency. Surgical: [name]  has a past surgical history that includes Cesarean section; Gastric bypass (2004); Lumbar fusion (2016); Spinal cord stimulator implant (2018); Cholecystectomy; Appendectomy; Tonsillectomy; Shoulder arthroscopy with rotator cuff repair (Right, 2008); Joint replacement (Bilateral, 2005); Abdominal hysterectomy (2000); epidural injection (2018); arm surgery (2002); Breast biopsy (Left, 2005); Colonoscopy with propofol (N/A, 08/25/2017); Temporal artery biopsy / ligation; and Cataract extraction w/PHACO (Right, 10/21/2017). Family: family history includes Anxiety disorder in her mother; Breast cancer in her cousin, maternal aunt, and mother; Cancer in her brother, father, and mother; Depression in her mother; Hypertension in her father; Kidney disease in her mother.  Laboratory Chemistry Profile   Renal Lab Results  Component Value Date   BUN 18 06/23/2017   CREATININE 0.60 06/23/2017   GFRAA >60 06/23/2017   GFRNONAA >60 06/23/2017     Hepatic Lab Results  Component Value Date   AST 20 06/23/2017   ALT 9 (L) 06/23/2017   ALBUMIN 3.6 06/23/2017   ALKPHOS 88 06/23/2017     Electrolytes Lab Results  Component Value Date   NA  135 06/23/2017   K 4.9 06/23/2017   CL 102 06/23/2017   CALCIUM 8.5 (L) 06/23/2017     Bone No results found for: VD25OH, VD125OH2TOT, FS2395VU0, EB3435WY6, 25OHVITD1, 25OHVITD2, 25OHVITD3, TESTOFREE, TESTOSTERONE   Inflammation (CRP: Acute Phase) (ESR: Chronic Phase) No results found for: CRP, ESRSEDRATE, LATICACIDVEN     Note: Above Lab results reviewed.  Recent Imaging Review  CT HEAD WO CONTRAST CLINICAL DATA:  Balance issues and repetitive motion.  EXAM: CT HEAD WITHOUT CONTRAST  TECHNIQUE: Contiguous axial images were obtained from the base of the skull through the vertex without intravenous contrast.  COMPARISON:  None.  FINDINGS: Brain: No evidence of acute infarction, hemorrhage, hydrocephalus, extra-axial collection or mass lesion/mass effect. Brain parenchyma has normal attenuation characteristics.  Vascular: No hyperdense vessel or unexpected calcification.  Skull: Normal. Negative for fracture or focal lesion.  Sinuses/Orbits: No acute finding.  Other: None.  IMPRESSION: Normal head CT.  Electronically Signed   By: Pedro Earls M.D.   On: 06/23/2019 10:17 Note: Reviewed        Physical Exam  General appearance: Well nourished, well developed, and well hydrated. In no apparent acute distress Mental status: Alert, oriented x 3 (person, place, & time)       Respiratory: No evidence of acute respiratory distress Eyes: PERLA Vitals: BP (!) 147/70   Pulse 60   Temp (!) 97.4 F (36.3 C)   Ht 5' (1.524 m)   Wt 220 lb (99.8 kg)   SpO2 96%   BMI 42.97 kg/m  BMI: Estimated body mass index is 42.97 kg/m as calculated from the following:   Height as of this encounter: 5' (1.524 m).   Weight as of this encounter: 220 lb (99.8 kg). Ideal: Ideal body weight: 45.5 kg (100 lb 4.9 oz) Adjusted ideal body weight: 67.2 kg (148 lb 3 oz)  Lumbar Spine Area Exam  Skin & Axial Inspection: Well healed scar from previous spine surgery  detected  spinal cord stimulator in place with IPG present Alignment: Symmetrical Functional ROM: Decreased ROM affecting both sides Stability: No instability detected Muscle Tone/Strength: Functionally intact. No obvious neuro-muscular anomalies detected. Sensory (Neurological): Dermatomal pain pattern Palpation: No palpable anomalies        Gait & Posture Assessment  Ambulation: Limited Gait: Antalgic gait (limping) Posture: Difficulty standing up straight, due to pain    Lower Extremity Exam    Side: Right lower extremity  Side: Left lower extremity  Stability: No instability observed          Stability: No instability observed          Skin & Extremity Inspection: Skin color, temperature, and hair growth are WNL. No peripheral edema or cyanosis. No masses, redness, swelling, asymmetry, or associated skin lesions. No contractures.  Skin & Extremity Inspection: Skin color, temperature, and hair growth are WNL. No peripheral edema or cyanosis. No masses, redness, swelling, asymmetry, or associated skin lesions. No contractures.  Functional ROM: Decreased ROM for hip and knee joints          Functional ROM: Decreased ROM for hip and knee joints          Muscle Tone/Strength: Functionally intact. No obvious neuro-muscular anomalies detected.  Muscle Tone/Strength: Functionally intact. No obvious neuro-muscular anomalies detected.  Sensory (Neurological): Dermatomal pain pattern        Sensory (Neurological): Dermatomal pain pattern        DTR: Patellar: deferred today Achilles: deferred today Plantar: deferred today  DTR: Patellar: deferred today Achilles: deferred today Plantar: deferred today  Palpation: No palpable anomalies  Palpation: No palpable anomalies    Assessment   Status Diagnosis  Controlled Controlled Controlled 1. History of lumbar fusion   2. Lumbar degenerative disc disease   3. Fibromyalgia   4. Lumbar radiculopathy   5. Chronic pain syndrome   6. Lumbar spondylosis    7. Spinal cord stimulator status   8. DDD (degenerative disc disease), cervical      Updated Problems: Problem  History of Lumbar Fusion  Lumbar Degenerative Disc Disease  Fibromyalgia  Chronic Pain Syndrome  Spinal Cord Stimulator Status  Degenerative Joint Disease of Cervical and Lumbar Spine  Generalized Osteoarthritis of Multiple Sites  Lumbar Post-Laminectomy Syndrome  Acquired Spondylolisthesis  Spinal Stenosis of Lumbar Region Without Neurogenic Claudication  Lumbar Spondylosis  Chronic Pain   Overview:  Dr. Leron Croak Overview:  Dr. Eliott Nine of this note might be different from the original. Dr. Leron Croak     Plan of Care   Ms. Tyshell Ramberg Thorley has a current medication list which includes the following long-term medication(s): azelastine, bupropion, citalopram, esomeprazole, gabapentin, glycopyrrolate, levothyroxine, loratadine, metoprolol succinate, pantoprazole, trazodone, azelastine, and glycopyrrolate.  Pharmacotherapy (Medications Ordered): Meds ordered this encounter  Medications  . gabapentin (NEURONTIN) 300 MG capsule    Sig: Take 1 capsule (300 mg total) by mouth 2 (two) times daily.    Dispense:  60 capsule    Refill:  5  . levorphanol (LEVODROMORAN) 2 MG tablet    Sig: 3 mg in am, 2 mg in afternoon, and 43m evening For chronic pain, 30 day Rx    Dispense:  105 tablet    Refill:  0  . levorphanol (LEVODROMORAN) 2 MG tablet    Sig: 3 mg in am, 2 mg in afternoon, and 237mevening For chronic pain, 30 day Rx    Dispense:  105 tablet    Refill:  0   Follow-up  plan:   Return in about 10 weeks (around 12/14/2019) for Medication Management, in person.    Recent Visits No visits were found meeting these conditions.  Showing recent visits within past 90 days and meeting all other requirements   Today's Visits Date Type Provider Dept  10/05/19 Office Visit [name], MD [facility]  Showing today's visits and meeting all  other requirements   Future Appointments Date Type Provider Dept  12/13/19 Appointment [name], MD [facility]  Showing future appointments within next 90 days and meeting all other requirements   I discussed the assessment and treatment plan with the patient. The patient was provided an opportunity to ask questions and all were answered. The patient agreed with the plan and demonstrated an understanding of the instructions.  Patient advised to call back or seek an in-person evaluation if the symptoms or condition worsens.  Duration of encounter: 38mnutes.  Note by: BGillis Santa MD Date: 10/05/2019; Time: 4:33 PM

## 2019-10-17 ENCOUNTER — Other Ambulatory Visit: Payer: Self-pay | Admitting: Psychiatry

## 2019-10-17 DIAGNOSIS — F3341 Major depressive disorder, recurrent, in partial remission: Secondary | ICD-10-CM

## 2019-10-17 DIAGNOSIS — F5105 Insomnia due to other mental disorder: Secondary | ICD-10-CM

## 2019-10-20 ENCOUNTER — Other Ambulatory Visit: Payer: Self-pay | Admitting: Psychiatry

## 2019-10-20 DIAGNOSIS — F331 Major depressive disorder, recurrent, moderate: Secondary | ICD-10-CM

## 2019-10-20 DIAGNOSIS — F5105 Insomnia due to other mental disorder: Secondary | ICD-10-CM

## 2019-10-21 MED ORDER — CITALOPRAM HYDROBROMIDE 20 MG PO TABS: 30.0000 mg | ORAL_TABLET | Freq: Every day | ORAL | 0 refills | Status: DC

## 2019-10-21 NOTE — Telephone Encounter (Signed)
I have sent Celexa to pharmacy.

## 2019-10-27 ENCOUNTER — Other Ambulatory Visit: Payer: Self-pay

## 2019-10-27 ENCOUNTER — Encounter: Payer: Self-pay | Admitting: Psychiatry

## 2019-10-27 ENCOUNTER — Telehealth (INDEPENDENT_AMBULATORY_CARE_PROVIDER_SITE_OTHER): Payer: 59 | Admitting: Psychiatry

## 2019-10-27 DIAGNOSIS — R413 Other amnesia: Secondary | ICD-10-CM

## 2019-10-27 DIAGNOSIS — F3342 Major depressive disorder, recurrent, in full remission: Secondary | ICD-10-CM | POA: Diagnosis not present

## 2019-10-27 DIAGNOSIS — F5105 Insomnia due to other mental disorder: Secondary | ICD-10-CM

## 2019-10-27 DIAGNOSIS — F411 Generalized anxiety disorder: Secondary | ICD-10-CM

## 2019-10-27 DIAGNOSIS — F3341 Major depressive disorder, recurrent, in partial remission: Secondary | ICD-10-CM | POA: Insufficient documentation

## 2019-10-27 NOTE — Progress Notes (Signed)
Provider Location : [facility] Patient Location : Home  Virtual Visit via Video Note  I connected with Connie [name] on 10/27/19 at  2:20 PM EDT by a video enabled telemedicine application and verified that I am speaking with the correct person using two identifiers.   I discussed the limitations of evaluation and management by telemedicine and the availability of in person appointments. The patient expressed understanding and agreed to proceed.   I discussed the assessment and treatment plan with the patient. The patient was provided an opportunity to ask questions and all were answered. The patient agreed with the plan and demonstrated an understanding of the instructions.   The patient was advised to call back or seek an in-person evaluation if the symptoms worsen or if the condition fails to improve as anticipated.   [place] MD OP Progress Note  10/27/2019 3:10 PM Connie [name]  MRN:  [id number]  Chief Complaint:  Chief Complaint    Follow-up     HPI: Connie [name] is a 64 year old Caucasian female, married, on SSD, lives in [place], has a history of depression, anxiety, insomnia, fibromyalgia, chronic pain on opioid medication, hypertension, hypothyroidism, polyarthralgia was evaluated by telemedicine today.  A video call was initiated however due to connection problem it had to be changed to a phone call.  Patient today reports she is currently making progress with regards to her depression and anxiety.  She currently rates her depression at a 2 out of 10, 10 being the worst.  She reports her medications are effective.  She also reports her anxiety symptoms as improving.  She reports sleep as okay at night.  She reports she was recently started on Ritalin for chronic fatigue syndrome.  She was struggling with excessive sleep during the day.  She reports the Ritalin does help her.  She reports she continues to be in pain.  She currently rates her pain at an 8 out of 10, 10 being the  worst.  She also struggles with muscle spasms.  She reports she is unable to get anything done because of her pain.  She continues to follow-up with her pain provider.  She also has memory problems and has upcoming testing scheduled.  She is also awaiting her sleep study.  Patient denies any suicidality, homicidality or perceptual disturbances.  She denies side effects to medications.    Visit Diagnosis:    ICD-10-CM   1. MDD (major depressive disorder), recurrent, in full remission ([place])  F33.42   2. GAD (generalized anxiety disorder)  F41.1   3. Insomnia due to mental disorder  F51.05   4. Memory loss  R41.3     Past Psychiatric History: I have reviewed past psychiatric history from my progress note on 08/06/2017.  Past trials of Latuda, Wellbutrin, Cymbalta  Past Medical History:  Past Medical History:  Diagnosis Date  . Anemia    in the past  . Anxiety   . Arthritis    all over  . Cervical spondylosis with myelopathy 2018  . Chronic pain 2019   can't stand straight, uses walker for stability  . DDD (degenerative disc disease), lumbar   . Depression   . Displacement of cervical intervertebral disc 2018  . Dyspnea    HANDICAPPED  . Fibromyalgia    Polymyalgia Rheumatica  . GERD (gastroesophageal reflux disease)   . Heart murmur    not treated or being followed  . Hypertension    CONTROLLED  . Hypothyroidism   .  Lumbar post-laminectomy syndrome 2018  . Lumbosacral radiculitis 2018  . Neuropathy due to medical condition (HCC)    feet, toes, fingers  . Osteoarthritis   . Osteoporosis   . Other long term (current) drug therapy 2019   from pain management  . Pain in the coccyx 2018   disorder of sacrum  . Spondylolisthesis 2018  . Spondylosis of lumbosacral region without myelopathy or radiculopathy 2018  . Spondylosis with myelopathy, lumbar region 2018  . Vitamin D deficiency     Past Surgical History:  Procedure Laterality Date  . ABDOMINAL HYSTERECTOMY   2000  . APPENDECTOMY    . arm surgery  2002  . BREAST BIOPSY Left 2005   benign  . CATARACT EXTRACTION W/PHACO Right 10/21/2017   Procedure: CATARACT EXTRACTION PHACO AND INTRAOCULAR LENS PLACEMENT ([place]) RIGHT;  Surgeon: Leandrew Koyanagi, MD;  Location: [facility];  Service: Ophthalmology;  Laterality: Right;  neck issues  . CESAREAN SECTION     x 2  . CHOLECYSTECTOMY    . COLONOSCOPY WITH PROPOFOL N/A 08/25/2017   Procedure: COLONOSCOPY WITH PROPOFOL;  Surgeon: Toledo, Benay Pike, MD;  Location: [facility];  Service: Gastroenterology;  Laterality: N/A;  . epidural injection  2018   steroids  . GASTRIC BYPASS  2004  . JOINT REPLACEMENT Bilateral 2005   knees  . LUMBAR FUSION  2016   x 2/ L3-5  . SHOULDER ARTHROSCOPY WITH ROTATOR CUFF REPAIR Right 2008   x 3  . SPINAL CORD STIMULATOR IMPLANT  2018   [facility] scientific  . TEMPORAL ARTERY BIOPSY / LIGATION    . TONSILLECTOMY      Family Psychiatric History: I have reviewed family psychiatric history from my progress note on 08/06/2017.  Family History:  Family History  Problem Relation Age of Onset  . Kidney disease Mother   . Cancer Mother   . Anxiety disorder Mother   . Depression Mother   . Breast cancer Mother   . Cancer Father   . Hypertension Father   . Cancer Brother   . Breast cancer Maternal Aunt   . Breast cancer Cousin     Social History: I have reviewed social history from my progress note on 08/06/2017. Social History   Socioeconomic History  . Marital status: Married    Spouse name: brady   . Number of children: 2  . Years of education: Not on file  . Highest education level: High school graduate  Occupational History    Comment: disabled   Tobacco Use  . Smoking status: Former Smoker    Packs/day: 0.50    Years: 20.00    Pack years: 10.00    Types: Cigarettes    Quit date: 05/21/2007    Years since quitting: 12.4  . Smokeless tobacco: Never Used  Vaping Use  . Vaping Use: Never  used  Substance and Sexual Activity  . Alcohol use: No  . Drug use: No    Comment: managed by pain clinic  . Sexual activity: Not Currently  Other Topics Concern  . Not on file  Social History Narrative  . Not on file   Social Determinants of Health   Financial Resource Strain:   . Difficulty of Paying Living Expenses:   Food Insecurity:   . Worried About Charity fundraiser in the Last Year:   . Arboriculturist in the Last Year:   Transportation Needs:   . Film/video editor (Medical):   Marland Kitchen Lack of  Transportation (Non-Medical):   Physical Activity:   . Days of Exercise per Week:   . Minutes of Exercise per Session:   Stress:   . Feeling of Stress :   Social Connections:   . Frequency of Communication with Friends and Family:   . Frequency of Social Gatherings with Friends and Family:   . Attends Religious Services:   . Active Member of Clubs or Organizations:   . Attends Archivist Meetings:   Marland Kitchen Marital Status:     Allergies:  Allergies  Allergen Reactions  . Tape Rash    Skin peels off and blisters.  Probably was clear plastic tape.    Metabolic Disorder Labs: No results found for: HGBA1C, MPG No results found for: PROLACTIN No results found for: CHOL, TRIG, HDL, CHOLHDL, VLDL, LDLCALC No results found for: TSH  Therapeutic Level Labs: No results found for: LITHIUM No results found for: VALPROATE No components found for:  CBMZ  Current Medications: Current Outpatient Medications  Medication Sig Dispense Refill  . methylphenidate (RITALIN) 5 MG tablet Take by mouth.    Marland Kitchen azelastine (ASTELIN) 0.1 % nasal spray Place into the nose.    Marland Kitchen azelastine (ASTELIN) 0.1 % nasal spray U 1 SPR IEN BID    . buPROPion (WELLBUTRIN SR) 200 MG 12 hr tablet TAKE 1 TABLET(200 MG) BY MOUTH TWICE DAILY 180 tablet 0  . cefdinir (OMNICEF) 300 MG capsule Take 300 mg by mouth daily.     . celecoxib (CELEBREX) 200 MG capsule Take 200 mg by mouth 2 (two) times daily. AM     . citalopram (CELEXA) 20 MG tablet Take 1.5 tablets (30 mg total) by mouth daily. 135 tablet 0  . esomeprazole (NEXIUM) 40 MG capsule esomeprazole magnesium 40 mg capsule,delayed release    . furosemide (LASIX) 20 MG tablet Take 20 mg by mouth as needed.    . gabapentin (NEURONTIN) 300 MG capsule Take 1 capsule (300 mg total) by mouth 2 (two) times daily. 60 capsule 5  . glycopyrrolate (ROBINUL) 1 MG tablet Take 1 mg by mouth 2 (two) times daily. AM AND HS  3  . hydrOXYzine (VISTARIL) 25 MG capsule TAKE ONE CAPSULE BY MOUTH THREE TIMES DAILY AS NEEDED FOR SEVERE ANXIETY AS WELL AS FOR SLEEP 90 capsule 2  . ibuprofen (ADVIL,MOTRIN) 200 MG tablet Take 400 mg by mouth daily as needed for headache or moderate pain.    Marland Kitchen levofloxacin (LEVAQUIN) 500 MG tablet     . levorphanol (LEVODROMORAN) 2 MG tablet 3 mg in am, 2 mg in afternoon, and 2mg  evening For chronic pain, 30 day Rx 105 tablet 0  . [START ON 11/18/2019] levorphanol (LEVODROMORAN) 2 MG tablet 3 mg in am, 2 mg in afternoon, and 2mg  evening For chronic pain, 30 day Rx 105 tablet 0  . levothyroxine (SYNTHROID) 50 MCG tablet     . loratadine (CLARITIN) 10 MG tablet Take 10 mg by mouth daily as needed for allergies.    . methylphenidate (RITALIN) 5 MG tablet Take 5 mg by mouth daily.    . metoprolol succinate (TOPROL-XL) 100 MG 24 hr tablet Take 100 mg by mouth once daily in the evening  0  . ondansetron (ZOFRAN-ODT) 8 MG disintegrating tablet Take 8 mg by mouth daily as needed for nausea or vomiting.    . pantoprazole (PROTONIX) 40 MG tablet TAKE 1 TABLET(40 MG) BY MOUTH EVERY DAY    . predniSONE (DELTASONE) 5 MG tablet Take 4 mg by  mouth daily.    . traZODone (DESYREL) 100 MG tablet TAKE 1 TABLET(100 MG) BY MOUTH AT BEDTIME AS NEEDED FOR SLEEP 90 tablet 0  . vitamin B-12 (CYANOCOBALAMIN) 1000 MCG tablet Take by mouth.     No current facility-administered medications for this visit.     Musculoskeletal: Strength & Muscle Tone: UTA Gait &  Station: Uses a walker Patient leans: N/A  Psychiatric Specialty Exam: Review of Systems  Musculoskeletal: Positive for back pain.  Psychiatric/Behavioral: Positive for dysphoric mood (Improving). The patient is nervous/anxious (improving).   All other systems reviewed and are negative.   There were no vitals taken for this visit.There is no height or weight on file to calculate BMI.  General Appearance: UTA  Eye Contact:  UTA  Speech:  Clear and Coherent  Volume:  Normal  Mood:  Anxious and Dysphoric IMPROVING  Affect:  UTA  Thought Process:  Goal Directed and Descriptions of Associations: Intact  Orientation:  Full (Time, Place, and Person)  Thought Content: Logical   Suicidal Thoughts:  No  Homicidal Thoughts:  No  Memory:  Immediate;   Fair Recent;   Fair Remote;   Fair  Judgement:  Fair  Insight:  Fair  Psychomotor Activity:  UTA  Concentration:  Concentration: Fair and Attention Span: Fair  Recall:  [employer] of Knowledge: Fair  Language: Fair  Akathisia:  No  Handed:  Right  AIMS (if indicated): UTA  Assets:  Communication Skills Desire for Improvement Housing Social Support  ADL's:  Intact  Cognition: WNL  Sleep:  Fair   Screenings: PHQ2-9     Office Visit from 07/13/2018 in [facility] Clinical Support from 04/15/2018 in [facility] Clinical Support from 01/27/2018 in [facility] Clinical Support from 11/05/2017 in [facility] Procedure visit from 08/19/2017 in [facility]  PHQ-2 Total Score 0 0 0 0 0       Assessment and Plan: Vinie Charity is a 64 year old Caucasian female who has a history of depression, chronic pain, GERD, hypothyroidism, hypertension, fibromyalgia was evaluated by telemedicine today.  Patient with psychosocial stressors  of her health issues as well as the pandemic.  Patient currently continues to struggle with chronic fatigue syndrome, memory problems, muscle spasms and pain.  Patient is currently making progress with regards to her depression and anxiety.  Plan as noted below.  Plan MDD-in remission Wellbutrin SR 200 mg p.o. twice daily Discussed with patient that her Wellbutrin can be reduced to a lower dosage since she is also currently taking Ritalin for her chronic fatigue syndrome.  Patient however declines.  She will Biochemist, clinical know. Celexa 30 mg p.o. daily Provided medication education.  Discussed serotonin syndrome.  GAD-improving Celexa as prescribed Hydroxyzine 25 mg p.o. 3 times daily as needed  Insomnia-stable Trazodone 100 mg p.o. nightly as needed-reduced dosage Hydroxyzine 25 mg p.o. 3 times daily as needed   Memory problems-unstable Patient has been referred to [facility] memory clinic -pending She also has upcoming sleep study pending  I have reviewed medical records in [facility] per neurologist-Dr. Sunday Corn.  Follow-up in clinic in 2 months or sooner if needed.  I have spent atleast 20 minutes non face to face with patient today. More than 50 % of the time was spent for preparing to see the patient ( e.g., review of test, records ),  obtaining and to review and separately obtained history , ordering medications and test ,psychoeducation and supportive psychotherapy and care coordination,as well as documenting clinical information in electronic health record. This note was generated in part or whole with voice recognition software. Voice recognition is usually quite accurate but there are transcription errors that can and very often do occur. I apologize for any typographical errors that were not detected and corrected.          Ursula Alert, MD 10/27/2019, 3:10 PM

## 2019-12-07 ENCOUNTER — Other Ambulatory Visit: Payer: Self-pay | Admitting: Psychiatry

## 2019-12-07 DIAGNOSIS — F3341 Major depressive disorder, recurrent, in partial remission: Secondary | ICD-10-CM

## 2019-12-08 DIAGNOSIS — N949 Unspecified condition associated with female genital organs and menstrual cycle: Secondary | ICD-10-CM | POA: Insufficient documentation

## 2019-12-08 DIAGNOSIS — N760 Acute vaginitis: Secondary | ICD-10-CM | POA: Insufficient documentation

## 2019-12-13 ENCOUNTER — Other Ambulatory Visit: Payer: Self-pay

## 2019-12-13 ENCOUNTER — Encounter: Payer: Self-pay | Admitting: Student in an Organized Health Care Education/Training Program

## 2019-12-13 ENCOUNTER — Ambulatory Visit
Payer: Managed Care, Other (non HMO) | Attending: Student in an Organized Health Care Education/Training Program | Admitting: Student in an Organized Health Care Education/Training Program

## 2019-12-13 VITALS — BP 151/64 | HR 65 | Temp 96.4°F | Resp 18 | Ht 60.0 in | Wt 220.0 lb

## 2019-12-13 DIAGNOSIS — M5136 Other intervertebral disc degeneration, lumbar region: Secondary | ICD-10-CM | POA: Diagnosis not present

## 2019-12-13 DIAGNOSIS — M5416 Radiculopathy, lumbar region: Secondary | ICD-10-CM | POA: Diagnosis not present

## 2019-12-13 DIAGNOSIS — Z9689 Presence of other specified functional implants: Secondary | ICD-10-CM | POA: Diagnosis present

## 2019-12-13 DIAGNOSIS — M797 Fibromyalgia: Secondary | ICD-10-CM | POA: Insufficient documentation

## 2019-12-13 DIAGNOSIS — M47812 Spondylosis without myelopathy or radiculopathy, cervical region: Secondary | ICD-10-CM | POA: Insufficient documentation

## 2019-12-13 DIAGNOSIS — Z981 Arthrodesis status: Secondary | ICD-10-CM | POA: Diagnosis not present

## 2019-12-13 DIAGNOSIS — G894 Chronic pain syndrome: Secondary | ICD-10-CM | POA: Diagnosis present

## 2019-12-13 DIAGNOSIS — M5030 Other cervical disc degeneration, unspecified cervical region: Secondary | ICD-10-CM | POA: Insufficient documentation

## 2019-12-13 DIAGNOSIS — M47816 Spondylosis without myelopathy or radiculopathy, lumbar region: Secondary | ICD-10-CM | POA: Diagnosis not present

## 2019-12-13 DIAGNOSIS — M4807 Spinal stenosis, lumbosacral region: Secondary | ICD-10-CM | POA: Diagnosis present

## 2019-12-13 MED ORDER — LEVORPHANOL TARTRATE 2 MG PO TABS: ORAL_TABLET | ORAL | 0 refills | Status: AC

## 2019-12-13 MED ORDER — LEVORPHANOL TARTRATE 2 MG PO TABS: ORAL_TABLET | ORAL | 0 refills | Status: DC

## 2019-12-13 NOTE — Progress Notes (Signed)
PROVIDER NOTE: Information contained herein reflects review and annotations entered in association with encounter. Interpretation of such information and data should be left to medically-trained personnel. Information provided to patient can be located elsewhere in the medical record under "Patient Instructions". Document created using STT-dictation technology, any transcriptional errors that may result from process are unintentional.    Patient: Connie [name]  Service Category: E/M  Provider: [name], MD  DOB: 01/07/1956  DOS: 12/13/2019  Specialty: Interventional Pain Management  MRN: [id number]  Setting: Ambulatory outpatient  PCP: Tracie Harrier, MD  Type: Established Patient    Referring Provider: Tracie Harrier, MD  Location: Office  Delivery: Face-to-face     HPI  Reason for encounter: Connie [name], a 64 y.o. year old female, is here today for evaluation and management of her Lumbar degenerative disc disease [M51.36]. Connie [name] primary complain today is Foot Pain (right) and Back Pain Last encounter: Practice (10/05/2019). My last encounter with her was on 10/05/2019. Pertinent problems: Connie [name] has Degenerative joint disease of cervical and lumbar spine; Lumbar spondylosis; History of lumbar fusion; Lumbar degenerative disc disease; Fibromyalgia; Chronic pain syndrome; Spinal cord stimulator status; Acquired spondylolisthesis; Chronic pain; Generalized osteoarthritis of multiple sites; Spinal stenosis of lumbar region without neurogenic claudication; and Lumbar post-laminectomy syndrome on their pertinent problem list. Pain Assessment: Severity of Chronic pain is reported as a 3 /10. Location: Foot Right/denies. Onset: More than a month ago. Quality: Aching. Timing: Intermittent. Modifying factor(s): steroids. Vitals:  height is 5' (1.524 m) and weight is 220 lb (99.8 kg). Her temperature is 96.4 F (35.8 C) (abnormal). Her blood pressure is 151/64 (abnormal) and her  pulse is 65. Her respiration is 18 and oxygen saturation is 98%.   Of note, patient's gabapentin was discontinued by neurology, she was started on Lyrica.  She has right plantar foot pain.  She saw Dr. Duanne Moron.  She was prescribed prednisone and referred to podiatry.  Patient feels that it could be related to gout.  She did experience benefit with her steroid taper. She continues her levorphanol as prescribed for chronic pain management specifically in regards to her low back pain given history of lumbar spinal fusion and lumbar spinal stenosis with neurogenic claudication. I did asked the patient why levorphanol was not in her urine toxicology screen performed on 06/07/2019.  Patient states that she does not know why this is the case.  In the past, she has been compliant with therapy and adherent to medication regimen.  We'll repeat urine toxicology screen as below.  Pharmacotherapy Assessment   11/22/2019  2   10/05/2019  Levorphanol 2 MG Tablet  105.00  30 Bi Lat   [id number]   Wal ([id number])   0/0  77.00 MME  Comm Ins   [place]      Monitoring: [place] PMP: PDMP reviewed during this encounter.       Pharmacotherapy: No side-effects or adverse reactions reported. Compliance: No problems identified. Effectiveness: Clinically acceptable.  Dewayne Shorter, RN  12/13/2019  2:00 PM  Signed Nursing Pain Medication Assessment:  Safety precautions to be maintained throughout the outpatient stay will include: orient to surroundings, keep bed in low position, maintain call bell within reach at all times, provide assistance with transfer out of bed and ambulation.  Medication Inspection Compliance: Pill count conducted under aseptic conditions, in front of the patient. Neither the pills nor the bottle was removed from the patient's sight at any time. Once count was completed pills were  immediately returned to the patient in their original bottle.  Medication: Levorphanol Pill/Patch Count: 30 of 105 pills remain Pill/Patch  Appearance: Markings consistent with prescribed medication Bottle Appearance: Standard pharmacy container. Clearly labeled. Filled Date: 07 / 26 / 2021 Last Medication intake:  Today    UDS:  Summary  Date Value Ref Range Status  06/07/2019 Note  Final    Comment:    ==================================================================== ToxASSURE Select 13 (MW) ==================================================================== Test                             Result       Flag       Units   NO DRUGS DETECTED. ==================================================================== Test                      Result    Flag   Units      Ref Range   Creatinine              84               mg/dL      >=20 ==================================================================== Declared Medications:  The flagging and interpretation on this report are based on the  following declared medications.  Unexpected results may arise from  inaccuracies in the declared medications.  **Note: The testing scope of this panel does not include the  following reported medications:  Azelastine  Bupropion  Cefdinir  Celecoxib  Citalopram  Cyanocobalamin  Esomeprazole (Nexium)  Furosemide  Gabapentin  Glycopyrrolate  Hydroxyzine  Ibuprofen  Levofloxacin  Levorphanol  Levothyroxine  Loratadine  Metoprolol  Ondansetron  Pantoprazole  Prednisone  Ropinirole  Trazodone ==================================================================== For clinical consultation, please call [phone]. ====================================================================      ROS  Constitutional: Denies any fever or chills Gastrointestinal: No reported hemesis, hematochezia, vomiting, or acute GI distress Musculoskeletal: Mild low back pain Neurological: Neuropathic pain of lower extremity  Medication Review  azelastine, buPROPion, celecoxib, citalopram, furosemide, hydrOXYzine, ibuprofen, levorphanol,  levothyroxine, loratadine, methylphenidate, metoprolol succinate, ondansetron, pantoprazole, pregabalin, traZODone, and vitamin B-12  History Review  Allergy: Connie [name] has No Known Allergies. Drug: Ms. Vivar  reports no history of drug use. Alcohol:  reports no history of alcohol use. Tobacco:  reports that she quit smoking about 12 years ago. Her smoking use included cigarettes. She has a 10.00 pack-year smoking history. She has never used smokeless tobacco. Social: Ms. [place]os  reports that she quit smoking about 12 years ago. Her smoking use included cigarettes. She has a 10.00 pack-year smoking history. She has never used smokeless tobacco. She reports that she does not drink alcohol and does not use drugs. Medical:  has a past medical history of Anemia, Anxiety, Arthritis, Cervical spondylosis with myelopathy (2018), Chronic pain (2019), DDD (degenerative disc disease), lumbar, Depression, Displacement of cervical intervertebral disc (2018), Dyspnea, Fibromyalgia, GERD (gastroesophageal reflux disease), Heart murmur, Hypertension, Hypothyroidism, Lumbar post-laminectomy syndrome (2018), Lumbosacral radiculitis (2018), Neuropathy due to medical condition ([place]), Osteoarthritis, Osteoporosis, Other long term (current) drug therapy (2019), Pain in the coccyx (2018), Spondylolisthesis (2018), Spondylosis of lumbosacral region without myelopathy or radiculopathy (2018), Spondylosis with myelopathy, lumbar region (2018), and Vitamin D deficiency. Surgical: Ms. Feltner  has a past surgical history that includes Cesarean section; Gastric bypass (2004); Lumbar fusion (2016); Spinal cord stimulator implant (2018); Cholecystectomy; Appendectomy; Tonsillectomy; Shoulder arthroscopy with rotator cuff repair (Right, 2008); Joint replacement (Bilateral, 2005); Abdominal hysterectomy (2000); epidural injection (2018); arm surgery (2002);  Breast biopsy (Left, 2005); Colonoscopy with propofol (N/A, 08/25/2017); Temporal  artery biopsy / ligation; and Cataract extraction w/PHACO (Right, 10/21/2017). Family: family history includes Anxiety disorder in her mother; Breast cancer in her cousin, maternal aunt, and mother; Cancer in her brother, father, and mother; Depression in her mother; Hypertension in her father; Kidney disease in her mother.  Laboratory Chemistry Profile   Renal Lab Results  Component Value Date   BUN 18 06/23/2017   CREATININE 0.60 06/23/2017   GFRAA >60 06/23/2017   GFRNONAA >60 06/23/2017     Hepatic Lab Results  Component Value Date   AST 20 06/23/2017   ALT 9 (L) 06/23/2017   ALBUMIN 3.6 06/23/2017   ALKPHOS 88 06/23/2017     Electrolytes Lab Results  Component Value Date   NA 135 06/23/2017   K 4.9 06/23/2017   CL 102 06/23/2017   CALCIUM 8.5 (L) 06/23/2017     Bone No results found for: VD25OH, VD125OH2TOT, JQ7341PF7, TK2409BD5, 25OHVITD1, 25OHVITD2, 25OHVITD3, TESTOFREE, TESTOSTERONE   Inflammation (CRP: Acute Phase) (ESR: Chronic Phase) No results found for: CRP, ESRSEDRATE, LATICACIDVEN     Note: Above Lab results reviewed.  Recent Imaging Review  CT HEAD WO CONTRAST CLINICAL DATA:  Balance issues and repetitive motion.  EXAM: CT HEAD WITHOUT CONTRAST  TECHNIQUE: Contiguous axial images were obtained from the base of the skull through the vertex without intravenous contrast.  COMPARISON:  None.  FINDINGS: Brain: No evidence of acute infarction, hemorrhage, hydrocephalus, extra-axial collection or mass lesion/mass effect. Brain parenchyma has normal attenuation characteristics.  Vascular: No hyperdense vessel or unexpected calcification.  Skull: Normal. Negative for fracture or focal lesion.  Sinuses/Orbits: No acute finding.  Other: None.  IMPRESSION: Normal head CT.  Electronically Signed   By: Pedro Earls M.D.   On: 06/23/2019 10:17 Note: Reviewed        Physical Exam  General appearance: Well nourished, well  developed, and well hydrated. In no apparent acute distress Mental status: Alert, oriented x 3 (person, place, & time)       Respiratory: No evidence of acute respiratory distress Eyes: PERLA Vitals: BP (!) 151/64   Pulse 65   Temp (!) 96.4 F (35.8 C)   Resp 18   Ht 5' (1.524 m)   Wt 220 lb (99.8 kg)   SpO2 98%   BMI 42.97 kg/m  BMI: Estimated body mass index is 42.97 kg/m as calculated from the following:   Height as of this encounter: 5' (1.524 m).   Weight as of this encounter: 220 lb (99.8 kg). Ideal: Ideal body weight: 45.5 kg (100 lb 4.9 oz) Adjusted ideal body weight: 67.2 kg (148 lb 3 oz)  Lumbar Spine Area Exam  Skin & Axial Inspection: Well healed scar from previous spine surgery detected spinal cord stimulator in place with IPG present Alignment: Symmetrical Functional ROM: Decreased ROM affecting both sides Stability: No instability detected Muscle Tone/Strength: Functionally intact. No obvious neuro-muscular anomalies detected. Sensory (Neurological): Dermatomal pain pattern Palpation: No palpable anomalies        Gait & Posture Assessment  Ambulation: Limited Gait: Antalgic gait (limping) Posture: Difficulty standing up straight, due to pain    Lower Extremity Exam    Side: Right lower extremity  Side: Left lower extremity  Stability: No instability observed          Stability: No instability observed          Skin & Extremity Inspection: Skin color, temperature, and hair  growth are WNL. No peripheral edema or cyanosis. No masses, redness, swelling, asymmetry, or associated skin lesions. No contractures.  Skin & Extremity Inspection: Skin color, temperature, and hair growth are WNL. No peripheral edema or cyanosis. No masses, redness, swelling, asymmetry, or associated skin lesions. No contractures.  Functional ROM: Decreased ROM for hip and knee joints          Functional ROM: Decreased ROM for hip and knee joints          Muscle Tone/Strength:  Functionally intact. No obvious neuro-muscular anomalies detected.  Muscle Tone/Strength: Functionally intact. No obvious neuro-muscular anomalies detected.  Sensory (Neurological): Dermatomal pain pattern        Sensory (Neurological): Dermatomal pain pattern        DTR: Patellar: deferred today Achilles: deferred today Plantar: deferred today  DTR: Patellar: deferred today Achilles: deferred today Plantar: deferred today  Palpation: No palpable anomalies  Palpation: No palpable anomalies     Assessment   Status Diagnosis  Controlled Controlled Controlled 1. Lumbar degenerative disc disease   2. Lumbar radiculopathy   3. History of lumbar fusion   4. Lumbar spondylosis   5. Spinal cord stimulator status   6. DDD (degenerative disc disease), cervical   7. Spinal stenosis of lumbosacral region   8. Spondylosis of cervical region without myelopathy or radiculopathy   9. Fibromyalgia   10. Chronic pain syndrome      Updated Problems: Problem  Chronic Pain   Dr. Holley Raring, [facility]     Plan of Care  Ms. Shondrea Steinert Caponigro has a current medication list which includes the following long-term medication(s): azelastine, levothyroxine, loratadine, metoprolol succinate, pantoprazole, azelastine, bupropion, citalopram, methylphenidate, methylphenidate, pregabalin, and trazodone.  Pharmacotherapy (Medications Ordered): Meds ordered this encounter  Medications  . levorphanol (LEVODROMORAN) 2 MG tablet    Sig: 3 mg in am, 2 mg in afternoon, and 81m evening For chronic pain, 30 day Rx    Dispense:  105 tablet    Refill:  0  . levorphanol (LEVODROMORAN) 2 MG tablet    Sig: 3 mg in am, 2 mg in afternoon, and 261mevening For chronic pain, 30 day Rx    Dispense:  105 tablet    Refill:  0  . levorphanol (LEVODROMORAN) 2 MG tablet    Sig: 3 mg in am, 2 mg in afternoon, and 43m643mvening For chronic pain, 30 day Rx    Dispense:  105 tablet    Refill:  0   Orders:  Orders Placed This  Encounter  Procedures  . ToxASSURE Select 13 (MW), Urine    Volume: 30 ml(s). Minimum 3 ml of urine is needed. Document temperature of fresh sample. Indications: Long term (current) use of opiate analgesic ([phone]  Order Specific Question:   Release to patient    Answer:   Immediate   Follow-up plan:   Return in about 3 months (around 03/14/2020) for Medication Management, in person.   Recent Visits Date Type Provider Dept  10/05/19 Office Visit [facility]  Showing recent visits within past 90 days and meeting all other requirements Today's Visits Date Type Provider Dept  12/13/19 Office Visit [facility]  Showing today's visits and meeting all other requirements Future Appointments No visits were found meeting these conditions. Showing future appointments within next 90 days and meeting all other requirements  I discussed the assessment and treatment plan with the patient. The patient was provided an  opportunity to ask questions and all were answered. The patient agreed with the plan and demonstrated an understanding of the instructions.  Patient advised to call back or seek an in-person evaluation if the symptoms or condition worsens.  Duration of encounter:30 minutes.  Note by: [name], MD Date: 12/13/2019; Time: 2:27 PM

## 2019-12-13 NOTE — Progress Notes (Signed)
Nursing Pain Medication Assessment:  Safety precautions to be maintained throughout the outpatient stay will include: orient to surroundings, keep bed in low position, maintain call bell within reach at all times, provide assistance with transfer out of bed and ambulation.  Medication Inspection Compliance: Pill count conducted under aseptic conditions, in front of the patient. Neither the pills nor the bottle was removed from the patient's sight at any time. Once count was completed pills were immediately returned to the patient in their original bottle.  Medication: Levorphanol Pill/Patch Count: 30 of 105 pills remain Pill/Patch Appearance: Markings consistent with prescribed medication Bottle Appearance: Standard pharmacy container. Clearly labeled. Filled Date: 07 / 26 / 2021 Last Medication intake:  Today

## 2019-12-17 LAB — TOXASSURE SELECT 13 (MW), URINE

## 2019-12-29 ENCOUNTER — Other Ambulatory Visit: Payer: Self-pay | Admitting: Psychiatry

## 2019-12-29 ENCOUNTER — Telehealth (INDEPENDENT_AMBULATORY_CARE_PROVIDER_SITE_OTHER): Payer: 59 | Admitting: Psychiatry

## 2019-12-29 ENCOUNTER — Other Ambulatory Visit: Payer: Self-pay

## 2019-12-29 ENCOUNTER — Encounter: Payer: Self-pay | Admitting: Psychiatry

## 2019-12-29 DIAGNOSIS — G4710 Hypersomnia, unspecified: Secondary | ICD-10-CM

## 2019-12-29 DIAGNOSIS — F3341 Major depressive disorder, recurrent, in partial remission: Secondary | ICD-10-CM

## 2019-12-29 DIAGNOSIS — F3342 Major depressive disorder, recurrent, in full remission: Secondary | ICD-10-CM | POA: Insufficient documentation

## 2019-12-29 DIAGNOSIS — F5105 Insomnia due to other mental disorder: Secondary | ICD-10-CM

## 2019-12-29 DIAGNOSIS — F411 Generalized anxiety disorder: Secondary | ICD-10-CM | POA: Diagnosis not present

## 2019-12-29 DIAGNOSIS — R413 Other amnesia: Secondary | ICD-10-CM

## 2019-12-29 MED ORDER — CITALOPRAM HYDROBROMIDE 20 MG PO TABS: 30.0000 mg | ORAL_TABLET | Freq: Every day | ORAL | 0 refills | Status: DC

## 2019-12-29 MED ORDER — BUPROPION HCL ER (XL) 300 MG PO TB24: 300.0000 mg | ORAL_TABLET | Freq: Every day | ORAL | 1 refills | Status: DC

## 2019-12-29 MED ORDER — BUPROPION HCL ER (XL) 150 MG PO TB24: 150.0000 mg | ORAL_TABLET | Freq: Every day | ORAL | 1 refills | Status: DC

## 2019-12-29 NOTE — Progress Notes (Signed)
Provider Location : [facility] Patient Location : Home  Participants: Patient , Provider  Virtual Visit via Video Note  I connected with Connie [name] on 12/29/19 at  4:00 PM EDT by a video enabled telemedicine application and verified that I am speaking with the correct person using two identifiers.   I discussed the limitations of evaluation and management by telemedicine and the availability of in person appointments. The patient expressed understanding and agreed to proceed.     I discussed the assessment and treatment plan with the patient. The patient was provided an opportunity to ask questions and all were answered. The patient agreed with the plan and demonstrated an understanding of the instructions.   The patient was advised to call back or seek an in-person evaluation if the symptoms worsen or if the condition fails to improve as anticipated.    [place] MD OP Progress Note  12/29/2019 5:34 PM [name]  MRN:  [id number]  Chief Complaint:  Chief Complaint    Follow-up     HPI: Connie [name] is a 64 year old Caucasian female, married, on SSD, lives in [place], has a history of depression, anxiety, insomnia, fibromyalgia, chronic pain on opioid medication, hypertension, hypothyroidism, polyarthralgia was evaluated by telemedicine today.  Patient today reports she is currently struggling with excessive sleepiness during the day.  She reports she falls asleep during the day and is hardly able to keep herself awake.  She has also fallen asleep while eating and her husband has found her that way recently.  She reports she was prescribed CPAP recently and she is planning to start using it regularly.  She is hopeful that might help her with her excessive sleepiness during the day.  She also has chronic fatigue syndrome and was recently started on Ritalin by neurology.  She is on 5 mg.  She does not think the Ritalin is helpful at all with her excessive sleepiness.  Patient  reports mood wise she continues to be anxious about her current health issues.  Patient denies any suicidality, homicidality or perceptual disturbances.  Patient denies any other concerns today.  Visit Diagnosis:    ICD-10-CM   1. MDD (major depressive disorder), recurrent, in full remission ([place])  F33.42 buPROPion (WELLBUTRIN XL) 300 MG 24 hr tablet    buPROPion (WELLBUTRIN XL) 150 MG 24 hr tablet  2. GAD (generalized anxiety disorder)  F41.1   3. Insomnia due to mental disorder  F51.05 citalopram (CELEXA) 20 MG tablet   improving  4. Hypersomnia  G47.10   5. Memory loss  R41.3     Past Psychiatric History: I have reviewed past psychiatric history from my progress note on 08/06/2017.  Past trials of Latuda, Wellbutrin, Cymbalta  Past Medical History:  Past Medical History:  Diagnosis Date  . Anemia    in the past  . Anxiety   . Arthritis    all over  . Cervical spondylosis with myelopathy 2018  . Chronic pain 2019   can't stand straight, uses walker for stability  . DDD (degenerative disc disease), lumbar   . Depression   . Displacement of cervical intervertebral disc 2018  . Dyspnea    HANDICAPPED  . Fibromyalgia    Polymyalgia Rheumatica  . GERD (gastroesophageal reflux disease)   . Heart murmur    not treated or being followed  . Hypertension    CONTROLLED  . Hypothyroidism   . Lumbar post-laminectomy syndrome 2018  . Lumbosacral radiculitis 2018  . Neuropathy due  to medical condition (HCC)    feet, toes, fingers  . Osteoarthritis   . Osteoporosis   . Other long term (current) drug therapy 2019   from pain management  . Pain in the coccyx 2018   disorder of sacrum  . Spondylolisthesis 2018  . Spondylosis of lumbosacral region without myelopathy or radiculopathy 2018  . Spondylosis with myelopathy, lumbar region 2018  . Vitamin D deficiency     Past Surgical History:  Procedure Laterality Date  . ABDOMINAL HYSTERECTOMY  2000  . APPENDECTOMY    . arm  surgery  2002  . BREAST BIOPSY Left 2005   benign  . CATARACT EXTRACTION W/PHACO Right 10/21/2017   Procedure: CATARACT EXTRACTION PHACO AND INTRAOCULAR LENS PLACEMENT ([place]) RIGHT;  Surgeon: Leandrew Koyanagi, MD;  Location: [facility];  Service: Ophthalmology;  Laterality: Right;  neck issues  . CESAREAN SECTION     x 2  . CHOLECYSTECTOMY    . COLONOSCOPY WITH PROPOFOL N/A 08/25/2017   Procedure: COLONOSCOPY WITH PROPOFOL;  Surgeon: Toledo, Benay Pike, MD;  Location: [facility];  Service: Gastroenterology;  Laterality: N/A;  . epidural injection  2018   steroids  . GASTRIC BYPASS  2004  . JOINT REPLACEMENT Bilateral 2005   knees  . LUMBAR FUSION  2016   x 2/ L3-5  . SHOULDER ARTHROSCOPY WITH ROTATOR CUFF REPAIR Right 2008   x 3  . SPINAL CORD STIMULATOR IMPLANT  2018   [facility] scientific  . TEMPORAL ARTERY BIOPSY / LIGATION    . TONSILLECTOMY      Family Psychiatric History: I have reviewed family psychiatric history from my progress note on 08/06/2017.  Family History:  Family History  Problem Relation Age of Onset  . Kidney disease Mother   . Cancer Mother   . Anxiety disorder Mother   . Depression Mother   . Breast cancer Mother   . Cancer Father   . Hypertension Father   . Cancer Brother   . Breast cancer Maternal Aunt   . Breast cancer Cousin     Social History: I have reviewed social history from my progress note on 08/06/2017. Social History   Socioeconomic History  . Marital status: Married    Spouse name: brady   . Number of children: 2  . Years of education: Not on file  . Highest education level: High school graduate  Occupational History    Comment: disabled   Tobacco Use  . Smoking status: Former Smoker    Packs/day: 0.50    Years: 20.00    Pack years: 10.00    Types: Cigarettes    Quit date: 05/21/2007    Years since quitting: 12.6  . Smokeless tobacco: Never Used  Vaping Use  . Vaping Use: Never used  Substance and Sexual  Activity  . Alcohol use: No  . Drug use: No    Comment: managed by pain clinic  . Sexual activity: Not Currently  Other Topics Concern  . Not on file  Social History Narrative  . Not on file   Social Determinants of Health   Financial Resource Strain:   . Difficulty of Paying Living Expenses: Not on file  Food Insecurity:   . Worried About Charity fundraiser in the Last Year: Not on file  . Ran Out of Food in the Last Year: Not on file  Transportation Needs:   . Lack of Transportation (Medical): Not on file  . Lack of Transportation (Non-Medical): Not on file  Physical Activity:   . Days of Exercise per Week: Not on file  . Minutes of Exercise per Session: Not on file  Stress:   . Feeling of Stress : Not on file  Social Connections:   . Frequency of Communication with Friends and Family: Not on file  . Frequency of Social Gatherings with Friends and Family: Not on file  . Attends Religious Services: Not on file  . Active Member of Clubs or Organizations: Not on file  . Attends Archivist Meetings: Not on file  . Marital Status: Not on file    Allergies: No Known Allergies  Metabolic Disorder Labs: No results found for: HGBA1C, MPG No results found for: PROLACTIN No results found for: CHOL, TRIG, HDL, CHOLHDL, VLDL, LDLCALC No results found for: TSH  Therapeutic Level Labs: No results found for: LITHIUM No results found for: VALPROATE No components found for:  CBMZ  Current Medications: Current Outpatient Medications  Medication Sig Dispense Refill  . azelastine (ASTELIN) 0.1 % nasal spray Place into the nose.    Marland Kitchen azelastine (ASTELIN) 0.1 % nasal spray U 1 SPR IEN BID    . buPROPion (WELLBUTRIN XL) 150 MG 24 hr tablet Take 1 tablet (150 mg total) by mouth daily. To be combined with 300 mg 30 tablet 1  . buPROPion (WELLBUTRIN XL) 300 MG 24 hr tablet Take 1 tablet (300 mg total) by mouth daily. To be combined with 150 mg 30 tablet 1  . celecoxib  (CELEBREX) 200 MG capsule Take 200 mg by mouth 2 (two) times daily. AM    . citalopram (CELEXA) 20 MG tablet Take 1.5 tablets (30 mg total) by mouth daily. 135 tablet 0  . furosemide (LASIX) 20 MG tablet Take 20 mg by mouth as needed.    . hydrOXYzine (VISTARIL) 25 MG capsule TAKE ONE CAPSULE BY MOUTH THREE TIMES DAILY AS NEEDED FOR SEVERE ANXIETY AS WELL AS FOR SLEEP 90 capsule 2  . ibuprofen (ADVIL,MOTRIN) 200 MG tablet Take 400 mg by mouth daily as needed for headache or moderate pain.    Marland Kitchen levorphanol (LEVODROMORAN) 2 MG tablet 3 mg in am, 2 mg in afternoon, and 2mg  evening For chronic pain, 30 day Rx 105 tablet 0  . [START ON 01/21/2020] levorphanol (LEVODROMORAN) 2 MG tablet 3 mg in am, 2 mg in afternoon, and 2mg  evening For chronic pain, 30 day Rx 105 tablet 0  . [START ON 02/20/2020] levorphanol (LEVODROMORAN) 2 MG tablet 3 mg in am, 2 mg in afternoon, and 2mg  evening For chronic pain, 30 day Rx 105 tablet 0  . levothyroxine (SYNTHROID) 50 MCG tablet     . loratadine (CLARITIN) 10 MG tablet Take 10 mg by mouth daily as needed for allergies.    . methylphenidate (RITALIN) 5 MG tablet Take by mouth.    . methylphenidate (RITALIN) 5 MG tablet Take 5 mg by mouth daily.    . metoprolol succinate (TOPROL-XL) 100 MG 24 hr tablet Take 100 mg by mouth once daily in the evening  0  . ondansetron (ZOFRAN-ODT) 8 MG disintegrating tablet Take 8 mg by mouth daily as needed for nausea or vomiting.    . pantoprazole (PROTONIX) 40 MG tablet TAKE 1 TABLET(40 MG) BY MOUTH EVERY DAY    . predniSONE (DELTASONE) 20 MG tablet Take 20 mg by mouth 2 (two) times daily.    . pregabalin (LYRICA) 100 MG capsule Take 100 mg by mouth 3 (three) times daily.    Marland Kitchen  traZODone (DESYREL) 100 MG tablet TAKE 1 TO 2 TABLETS(100 TO 200 MG) BY MOUTH AT BEDTIME AS NEEDED FOR SLEEP 180 tablet 1  . vitamin B-12 (CYANOCOBALAMIN) 1000 MCG tablet Take by mouth.     No current facility-administered medications for this visit.      Musculoskeletal: Strength & Muscle Tone: UTA Gait & Station: Uses walker Patient leans: N/A  Psychiatric Specialty Exam: Review of Systems  Constitutional: Positive for fatigue.  Musculoskeletal: Positive for arthralgias and back pain.  Psychiatric/Behavioral: Positive for decreased concentration and sleep disturbance (Excessive sleep during the day, restless sleep at night).  All other systems reviewed and are negative.   There were no vitals taken for this visit.There is no height or weight on file to calculate BMI.  General Appearance: Casual  Eye Contact:  Fair  Speech:  Clear and Coherent  Volume:  Normal  Mood:  Anxious  Affect:  Congruent  Thought Process:  Goal Directed and Descriptions of Associations: Intact  Orientation:  Full (Time, Place, and Person)  Thought Content: Logical   Suicidal Thoughts:  No  Homicidal Thoughts:  No  Memory:  Immediate;   Fair Recent;   Fair Remote;   Fair  Judgement:  Fair  Insight:  Fair  Psychomotor Activity:  Normal  Concentration:  Concentration: Fair and Attention Span: Fair  Recall:  [employer] of Knowledge: Fair  Language: Fair  Akathisia:  No  Handed:  Right  AIMS (if indicated): UTA  Assets:  Communication Skills Desire for Improvement Housing Intimacy  ADL's:  Intact  Cognition: WNL  Sleep:  Restless   Screenings: PHQ2-9     Office Visit from 12/13/2019 in [facility] Office Visit from 07/13/2018 in [place] Clinical Support from 04/15/2018 in [facility] Clinical Support from 01/27/2018 in [facility] Clinical Support from 11/05/2017 in [facility]  PHQ-2 Total Score 0 0 0 0 0       Assessment and Plan: Connie [name] is a 64 year old Caucasian female who has a history of depression,  chronic pain, GERD, hypothyroidism, hypertension, fibromyalgia was evaluated by telemedicine today.  Patient with psychosocial stressors of health issues as well as the pandemic.  Patient is currently struggling with excessive sleepiness during the day as well as cognitive issues and continues to struggle with chronic pain syndrome.  Patient will benefit from the following medication readjustment.  Plan as noted below.  Plan MDD in remission However will increase her Wellbutrin since she is struggling with excessive sleepiness. Change Wellbutrin to Wellbutrin XL 450 mg p.o. daily. Continue Ritalin as prescribed by neurologist. Celexa 30 mg p.o. daily  GAD-improving Celexa as prescribed Hydroxyzine 25 mg p.o. 3 times daily as needed  Insomnia-improving Trazodone 100 mg p.o. nightly as needed-reduced dosage Hydroxyzine 25 mg p.o. 3 times daily as needed Patient reports she was prescribed CPAP recently, encourage compliance-for OSA.  Memory problems-unstable Patient has been referred to [facility] memory clinic-pending Patient had sleep study completed recently and is currently on CPAP.  Follow-up in clinic in 6 to 8 weeks or sooner if needed.  I have spent atleast 20 minutes face to face with patient today. More than 50 % of the time was spent for preparing to see the patient ( e.g., review of test, records ), ordering medications and test ,psychoeducation and supportive psychotherapy and care coordination,as well  as documenting clinical information in electronic health record. This note was generated in part or whole with voice recognition software. Voice recognition is usually quite accurate but there are transcription errors that can and very often do occur. I apologize for any typographical errors that were not detected and corrected.     Ursula Alert, MD 12/30/2019, 8:02 AM

## 2020-01-23 ENCOUNTER — Other Ambulatory Visit: Payer: Self-pay | Admitting: Psychiatry

## 2020-01-23 DIAGNOSIS — F5105 Insomnia due to other mental disorder: Secondary | ICD-10-CM

## 2020-02-13 ENCOUNTER — Other Ambulatory Visit: Payer: Self-pay

## 2020-02-13 ENCOUNTER — Encounter: Payer: Self-pay | Admitting: Psychiatry

## 2020-02-13 ENCOUNTER — Telehealth (INDEPENDENT_AMBULATORY_CARE_PROVIDER_SITE_OTHER): Payer: 59 | Admitting: Psychiatry

## 2020-02-13 DIAGNOSIS — F5105 Insomnia due to other mental disorder: Secondary | ICD-10-CM

## 2020-02-13 DIAGNOSIS — G4710 Hypersomnia, unspecified: Secondary | ICD-10-CM | POA: Diagnosis not present

## 2020-02-13 DIAGNOSIS — F411 Generalized anxiety disorder: Secondary | ICD-10-CM | POA: Diagnosis not present

## 2020-02-13 DIAGNOSIS — R413 Other amnesia: Secondary | ICD-10-CM

## 2020-02-13 DIAGNOSIS — F3342 Major depressive disorder, recurrent, in full remission: Secondary | ICD-10-CM | POA: Diagnosis not present

## 2020-02-13 MED ORDER — BUPROPION HCL ER (XL) 300 MG PO TB24: 300.0000 mg | ORAL_TABLET | Freq: Every day | ORAL | 1 refills | Status: DC

## 2020-02-13 MED ORDER — BUPROPION HCL ER (XL) 150 MG PO TB24: 150.0000 mg | ORAL_TABLET | Freq: Every day | ORAL | 1 refills | Status: DC

## 2020-02-13 NOTE — Progress Notes (Signed)
Provider Location : [facility] Patient Location : Home  Participants: Patient , Provider  Virtual Visit via Video Note  I connected with Connie [name] on 02/13/20 at  2:00 PM EDT by a video enabled telemedicine application and verified that I am speaking with the correct person using two identifiers.   I discussed the limitations of evaluation and management by telemedicine and the availability of in person appointments. The patient expressed understanding and agreed to proceed.     I discussed the assessment and treatment plan with the patient. The patient was provided an opportunity to ask questions and all were answered. The patient agreed with the plan and demonstrated an understanding of the instructions.   The patient was advised to call back or seek an in-person evaluation if the symptoms worsen or if the condition fails to improve as anticipated.   [place] MD OP Progress Note  02/13/2020 4:49 PM Connie [name]  MRN:  [id number]  Chief Complaint:  Chief Complaint    Follow-up     HPI: Connie [name] is a 64 year old Caucasian female, married, on SSD, lives in [place], has a history of MDD, GAD, insomnia, fibromyalgia, hypersomnia, chronic pain on opioid medication, hypothyroidism, polyarthralgia was evaluated by telemedicine today.  Patient today reports that since the past 2 weeks she has been struggling with GI symptoms.  She reports she likely has a stomach bug.  She had diarrhea as well as nausea and spent the last 2 weeks in bed without doing much.  She however reports today she feels much better with regards to her GI symptoms however currently has a fever of 101 F.  She reports she has not gotten in touch with her primary care provider yet.  She however reports she is willing to get in touch with her.  She reports her mood symptoms are stable.  Other than having the physical problems she has not had any significant depression or anxiety symptoms.  She reports since  sleep was excessive she has not been using the trazodone.  She could sleep without it.  Patient denies any suicidality, homicidality or perceptual disturbances.  Patient denies any other concerns today.  Visit Diagnosis:    ICD-10-CM   1. MDD (major depressive disorder), recurrent, in full remission ([place])  F33.42 buPROPion (WELLBUTRIN XL) 150 MG 24 hr tablet    buPROPion (WELLBUTRIN XL) 300 MG 24 hr tablet  2. GAD (generalized anxiety disorder)  F41.1   3. Insomnia due to mental disorder  F51.05   4. Hypersomnia  G47.10   5. Memory loss  R41.3     Past Psychiatric History: I have reviewed past psychiatric history from my progress note on 08/06/2017.  Past trials of Latuda, Wellbutrin, Cymbalta  Past Medical History:  Past Medical History:  Diagnosis Date  . Anemia    in the past  . Anxiety   . Arthritis    all over  . Cervical spondylosis with myelopathy 2018  . Chronic pain 2019   can't stand straight, uses walker for stability  . DDD (degenerative disc disease), lumbar   . Depression   . Displacement of cervical intervertebral disc 2018  . Dyspnea    HANDICAPPED  . Fibromyalgia    Polymyalgia Rheumatica  . GERD (gastroesophageal reflux disease)   . Heart murmur    not treated or being followed  . Hypertension    CONTROLLED  . Hypothyroidism   . Lumbar post-laminectomy syndrome 2018  . Lumbosacral radiculitis 2018  .  Neuropathy due to medical condition (HCC)    feet, toes, fingers  . Osteoarthritis   . Osteoporosis   . Other long term (current) drug therapy 2019   from pain management  . Pain in the coccyx 2018   disorder of sacrum  . Spondylolisthesis 2018  . Spondylosis of lumbosacral region without myelopathy or radiculopathy 2018  . Spondylosis with myelopathy, lumbar region 2018  . Vitamin D deficiency     Past Surgical History:  Procedure Laterality Date  . ABDOMINAL HYSTERECTOMY  2000  . APPENDECTOMY    . arm surgery  2002  . BREAST BIOPSY Left  2005   benign  . CATARACT EXTRACTION W/PHACO Right 10/21/2017   Procedure: CATARACT EXTRACTION PHACO AND INTRAOCULAR LENS PLACEMENT ([place]) RIGHT;  Surgeon: Leandrew Koyanagi, MD;  Location: [facility];  Service: Ophthalmology;  Laterality: Right;  neck issues  . CESAREAN SECTION     x 2  . CHOLECYSTECTOMY    . COLONOSCOPY WITH PROPOFOL N/A 08/25/2017   Procedure: COLONOSCOPY WITH PROPOFOL;  Surgeon: Toledo, Benay Pike, MD;  Location: [facility];  Service: Gastroenterology;  Laterality: N/A;  . epidural injection  2018   steroids  . GASTRIC BYPASS  2004  . JOINT REPLACEMENT Bilateral 2005   knees  . LUMBAR FUSION  2016   x 2/ L3-5  . SHOULDER ARTHROSCOPY WITH ROTATOR CUFF REPAIR Right 2008   x 3  . SPINAL CORD STIMULATOR IMPLANT  2018   [facility] scientific  . TEMPORAL ARTERY BIOPSY / LIGATION    . TONSILLECTOMY      Family Psychiatric History: I have reviewed family psychiatric history from my progress note on 08/06/2017.  Family History:  Family History  Problem Relation Age of Onset  . Kidney disease Mother   . Cancer Mother   . Anxiety disorder Mother   . Depression Mother   . Breast cancer Mother   . Cancer Father   . Hypertension Father   . Cancer Brother   . Breast cancer Maternal Aunt   . Breast cancer Cousin     Social History: I have reviewed social history from my progress note on 08/06/2017. Social History   Socioeconomic History  . Marital status: Married    Spouse name: brady   . Number of children: 2  . Years of education: Not on file  . Highest education level: High school graduate  Occupational History    Comment: disabled   Tobacco Use  . Smoking status: Former Smoker    Packs/day: 0.50    Years: 20.00    Pack years: 10.00    Types: Cigarettes    Quit date: 05/21/2007    Years since quitting: 12.7  . Smokeless tobacco: Never Used  Vaping Use  . Vaping Use: Never used  Substance and Sexual Activity  . Alcohol use: No  . Drug use:  No    Comment: managed by pain clinic  . Sexual activity: Not Currently  Other Topics Concern  . Not on file  Social History Narrative  . Not on file   Social Determinants of Health   Financial Resource Strain:   . Difficulty of Paying Living Expenses: Not on file  Food Insecurity:   . Worried About Charity fundraiser in the Last Year: Not on file  . Ran Out of Food in the Last Year: Not on file  Transportation Needs:   . Lack of Transportation (Medical): Not on file  . Lack of Transportation (Non-Medical): Not  on file  Physical Activity:   . Days of Exercise per Week: Not on file  . Minutes of Exercise per Session: Not on file  Stress:   . Feeling of Stress : Not on file  Social Connections:   . Frequency of Communication with Friends and Family: Not on file  . Frequency of Social Gatherings with Friends and Family: Not on file  . Attends Religious Services: Not on file  . Active Member of Clubs or Organizations: Not on file  . Attends Archivist Meetings: Not on file  . Marital Status: Not on file    Allergies: No Known Allergies  Metabolic Disorder Labs: No results found for: HGBA1C, MPG No results found for: PROLACTIN No results found for: CHOL, TRIG, HDL, CHOLHDL, VLDL, LDLCALC No results found for: TSH  Therapeutic Level Labs: No results found for: LITHIUM No results found for: VALPROATE No components found for:  CBMZ  Current Medications: Current Outpatient Medications  Medication Sig Dispense Refill  . azelastine (ASTELIN) 0.1 % nasal spray Place into the nose.    Marland Kitchen azelastine (ASTELIN) 0.1 % nasal spray U 1 SPR IEN BID    . buPROPion (WELLBUTRIN XL) 150 MG 24 hr tablet Take 1 tablet (150 mg total) by mouth daily. To be combined with 300 mg 90 tablet 1  . buPROPion (WELLBUTRIN XL) 300 MG 24 hr tablet Take 1 tablet (300 mg total) by mouth daily. To be combined with 150 mg 90 tablet 1  . celecoxib (CELEBREX) 200 MG capsule Take 200 mg by mouth 2  (two) times daily. AM    . citalopram (CELEXA) 20 MG tablet Take 1.5 tablets (30 mg total) by mouth daily. 135 tablet 0  . furosemide (LASIX) 20 MG tablet Take 20 mg by mouth as needed.    . hydrOXYzine (VISTARIL) 25 MG capsule TAKE ONE CAPSULE BY MOUTH THREE TIMES DAILY AS NEEDED FOR SEVERE ANXIETY AS WELL AS FOR SLEEP 90 capsule 2  . ibuprofen (ADVIL,MOTRIN) 200 MG tablet Take 400 mg by mouth daily as needed for headache or moderate pain.    Marland Kitchen levorphanol (LEVODROMORAN) 2 MG tablet 3 mg in am, 2 mg in afternoon, and 2mg  evening For chronic pain, 30 day Rx 105 tablet 0  . [START ON 02/20/2020] levorphanol (LEVODROMORAN) 2 MG tablet 3 mg in am, 2 mg in afternoon, and 2mg  evening For chronic pain, 30 day Rx 105 tablet 0  . levothyroxine (SYNTHROID) 50 MCG tablet     . loratadine (CLARITIN) 10 MG tablet Take 10 mg by mouth daily as needed for allergies.    . methylphenidate (RITALIN) 5 MG tablet Take by mouth.    . methylphenidate (RITALIN) 5 MG tablet Take 5 mg by mouth daily.    . metoprolol succinate (TOPROL-XL) 100 MG 24 hr tablet Take 100 mg by mouth once daily in the evening  0  . ondansetron (ZOFRAN-ODT) 8 MG disintegrating tablet Take 8 mg by mouth daily as needed for nausea or vomiting.    . pantoprazole (PROTONIX) 40 MG tablet TAKE 1 TABLET(40 MG) BY MOUTH EVERY DAY    . predniSONE (DELTASONE) 20 MG tablet Take 20 mg by mouth 2 (two) times daily.    . pregabalin (LYRICA) 100 MG capsule Take 100 mg by mouth 3 (three) times daily.    . traZODone (DESYREL) 100 MG tablet TAKE 1 TO 2 TABLETS(100 TO 200 MG) BY MOUTH AT BEDTIME AS NEEDED FOR SLEEP 180 tablet 1  . vitamin  B-12 (CYANOCOBALAMIN) 1000 MCG tablet Take by mouth.     No current facility-administered medications for this visit.     Musculoskeletal: Strength & Muscle Tone: UTA Gait & Station: Seated Patient leans: N/A  Psychiatric Specialty Exam: Review of Systems  Constitutional: Positive for fatigue and fever.   Gastrointestinal: Positive for diarrhea.  Psychiatric/Behavioral: Negative for agitation, behavioral problems, confusion, decreased concentration, dysphoric mood, hallucinations, self-injury, sleep disturbance and suicidal ideas. The patient is not nervous/anxious and is not hyperactive.   All other systems reviewed and are negative.   There were no vitals taken for this visit.There is no height or weight on file to calculate BMI.  General Appearance: Casual  Eye Contact:  Fair  Speech:  Clear and Coherent  Volume:  Normal  Mood:  Euthymic  Affect:  Congruent  Thought Process:  Goal Directed and Descriptions of Associations: Intact  Orientation:  Full (Time, Place, and Person)  Thought Content: Logical   Suicidal Thoughts:  No  Homicidal Thoughts:  No  Memory:  Immediate;   Fair Recent;   Fair Remote;   Fair  Judgement:  Fair  Insight:  Fair  Psychomotor Activity:  Normal  Concentration:  Concentration: Fair and Attention Span: Fair  Recall:  [employer] of Knowledge: Fair  Language: Fair  Akathisia:  No  Handed:  Right  AIMS (if indicated): UTA  Assets:  Communication Skills Desire for Improvement Housing Social Support  ADL's:  Intact  Cognition: WNL  Sleep:  excessive   Screenings: PHQ2-9     Office Visit from 12/13/2019 in [facility] Office Visit from 07/13/2018 in [facility] Clinical Support from 04/15/2018 in [place] [facility] Clinical Support from 01/27/2018 in [facility] Clinical Support from 11/05/2017 in [facility]  PHQ-2 Total Score 0 0 0 0 0       Assessment and Plan: Tiffiany Beadles is a 64 year old Caucasian female, has a history of depression, chronic pain, GERD, hypothyroidism, hypertension, fibromyalgia was evaluated by telemedicine today.   Patient with psychosocial stressors of health issues as well as the pandemic.  Patient is currently struggling with GI symptoms as well as has fever, will benefit from follow-up with an urgent care, primary care provider.  Discussed plan as noted below.  Plan MDD in remission Will not make any medication changes today. Continue Wellbutrin XL 450 mg p.o. daily Celexa 30 mg p.o. daily  GAD-stable Celexa as prescribed Hydroxyzine 25 mg p.o. 3 times daily as needed  Insomnia-improved Patient with recent GI symptoms, fever reports sleep as excessive recently due to fatigue and due to her physical problems. Hold trazodone 100 mg p.o. nightly as needed. She will use it as needed only. CPAP for OSA  Memory problems-unstable Patient was referred to [facility] memory clinic-she reports she is waiting for a call back from them.  Patient provided information for urgent care, [facility].  Advised to contact urgent care or primary care provider for her fever and GI problems.   Follow-up in clinic in 6 to 8 weeks or sooner if needed.  I have spent atleast 20 minutes face to face by video with patient today. More than 50 % of the time was spent for preparing to see the patient ( e.g., review of test, records ),ordering medications and test ,psychoeducation and supportive psychotherapy and care coordination,as well as documenting clinical  information in electronic health record. This note was generated in part or whole with voice recognition software. Voice recognition is usually quite accurate but there are transcription errors that can and very often do occur. I apologize for any typographical errors that were not detected and corrected.        Ursula Alert, MD 02/13/2020, 4:49 PM

## 2020-03-13 ENCOUNTER — Ambulatory Visit
Payer: Managed Care, Other (non HMO) | Attending: Student in an Organized Health Care Education/Training Program | Admitting: Student in an Organized Health Care Education/Training Program

## 2020-03-13 ENCOUNTER — Encounter: Payer: Self-pay | Admitting: Student in an Organized Health Care Education/Training Program

## 2020-03-13 ENCOUNTER — Other Ambulatory Visit: Payer: Self-pay

## 2020-03-13 VITALS — BP 116/63 | HR 60 | Temp 97.5°F | Resp 18 | Ht 60.0 in | Wt 220.0 lb

## 2020-03-13 DIAGNOSIS — M5416 Radiculopathy, lumbar region: Secondary | ICD-10-CM | POA: Insufficient documentation

## 2020-03-13 DIAGNOSIS — Z981 Arthrodesis status: Secondary | ICD-10-CM | POA: Insufficient documentation

## 2020-03-13 DIAGNOSIS — M47812 Spondylosis without myelopathy or radiculopathy, cervical region: Secondary | ICD-10-CM | POA: Diagnosis present

## 2020-03-13 DIAGNOSIS — M47816 Spondylosis without myelopathy or radiculopathy, lumbar region: Secondary | ICD-10-CM | POA: Insufficient documentation

## 2020-03-13 DIAGNOSIS — M4807 Spinal stenosis, lumbosacral region: Secondary | ICD-10-CM | POA: Diagnosis not present

## 2020-03-13 DIAGNOSIS — F1120 Opioid dependence, uncomplicated: Secondary | ICD-10-CM | POA: Insufficient documentation

## 2020-03-13 DIAGNOSIS — G894 Chronic pain syndrome: Secondary | ICD-10-CM | POA: Diagnosis present

## 2020-03-13 DIAGNOSIS — M5136 Other intervertebral disc degeneration, lumbar region: Secondary | ICD-10-CM | POA: Insufficient documentation

## 2020-03-13 DIAGNOSIS — Z9689 Presence of other specified functional implants: Secondary | ICD-10-CM | POA: Insufficient documentation

## 2020-03-13 MED ORDER — LEVORPHANOL TARTRATE 2 MG PO TABS: ORAL_TABLET | ORAL | 0 refills | Status: AC

## 2020-03-13 NOTE — Progress Notes (Signed)
Safety precautions to be maintained throughout the outpatient stay will include: orient to surroundings, keep bed in low position, maintain call bell within reach at all times, provide assistance with transfer out of bed and ambulation.  

## 2020-03-13 NOTE — Progress Notes (Signed)
PROVIDER NOTE: Information contained herein reflects review and annotations entered in association with encounter. Interpretation of such information and data should be left to medically-trained personnel. Information provided to patient can be located elsewhere in the medical record under "Patient Instructions". Document created using STT-dictation technology, any transcriptional errors that may result from process are unintentional.    Patient: Connie [name]  Service Category: E/M  Provider: [name], MD  DOB: [birth year]  DOS: 03/13/2020  Specialty: Interventional Pain Management  MRN: [id number]  Setting: Ambulatory outpatient  PCP: Tracie Harrier, MD  Type: Established Patient    Referring Provider: Tracie Harrier, MD  Location: Office  Delivery: Face-to-face     HPI  [name], a 64 y.o. year old female, is here today because of her Spondylosis of cervical region without myelopathy or radiculopathy [M47.812]. Connie [name] primary complain today is Medication Refill Last encounter: My last encounter with her was on 12/13/2019. Pertinent problems: Connie [name] has Degenerative joint disease of cervical and lumbar spine; Lumbar spondylosis; History of lumbar fusion; Lumbar degenerative disc disease; Fibromyalgia; Chronic pain syndrome; Spinal cord stimulator status; Acquired spondylolisthesis; Chronic pain; Generalized osteoarthritis of multiple sites; Spinal stenosis of lumbar region without neurogenic claudication; and Lumbar post-laminectomy syndrome on their pertinent problem list. Pain Assessment: Severity of Chronic pain is reported as a 2 /10. Location: Back Lower/radiates from lower back into left hip and into left foot. Onset: More than a month ago. Quality:  . Timing: Constant. Modifying factor(s):  Marland Kitchen Vitals:  height is 5' (1.524 m) and weight is 220 lb (99.8 kg). Her temperature is 97.5 F (36.4 C) (abnormal). Her blood pressure is 116/63 and her pulse is 60. Her  respiration is 18 and oxygen saturation is 95%.   Reason for encounter: medication management.   Patient follows up today for medication management.  She states that she had a fall this morning prior to coming in.  Unclear as to what brought upon her fall.  She states that she has been having more dizziness at times.  Patient is on Lyrica 100 mg 3 times a day that is managed by neurology.  I instructed her to contact neurology and see if it is okay to reduce her Lyrica to 100 mg twice a day as I believe that she is having drug-related side effects to Lyrica.  Otherwise she continues levorphanol as prescribed.  Interestingly, we have done multiple urine toxicology screens which have been negative for levorphanol which is interesting.  I have very low suspicion of the patient diverting or misusing her opioid analgesics.  She has always been compliant with therapy.   Pharmacotherapy Assessment   02/09/2020  12/13/2019   2  Levorphanol 2 Mg Tablet 105.00  30  Bi Lat  [id number]  Wal ([id number])  0/0  77.00 MME  Comm Ins  [place]       Monitoring: [place] PMP: PDMP not reviewed this encounter.       Pharmacotherapy: No side-effects or adverse reactions reported. Compliance: No problems identified. Effectiveness: Clinically acceptable.  Janne Napoleon, RN  03/13/2020  2:50 PM  Sign when Signing Visit Safety precautions to be maintained throughout the outpatient stay will include: orient to surroundings, keep bed in low position, maintain call bell within reach at all times, provide assistance with transfer out of bed and ambulation.     UDS:  Summary  Date Value Ref Range Status  12/13/2019 Note  Final    Comment:    ====================================================================  ToxASSURE Select 13 (MW) ==================================================================== Test                             Result       Flag       Units    NO DRUGS  DETECTED. ==================================================================== Test                      Result    Flag   Units      Ref Range   Creatinine              233              mg/dL      >=20 ==================================================================== Declared Medications:  The flagging and interpretation on this report are based on the  following declared medications.  Unexpected results may arise from  inaccuracies in the declared medications.   **Note: The testing scope of this panel does not include the  following reported medications:   Azelastine (Astelin)  Bupropion (Wellbutrin)  Celecoxib (Celebrex)  Citalopram (Celexa)  Cyanocobalamin  Furosemide (Lasix)  Hydroxyzine (Vistaril)  Ibuprofen (Advil)  Levorphanol  Levothyroxine (Synthroid)  Methylphenidate (Ritalin)  Metoprolol (Toprol)  Ondansetron (Zofran)  Pantoprazole (Protonix)  Pregabalin (Lyrica)  Trazodone (Desyrel) ==================================================================== For clinical consultation, please call [phone]. ====================================================================      ROS  Constitutional: Denies any fever or chills Gastrointestinal: No reported hemesis, hematochezia, vomiting, or acute GI distress Musculoskeletal: Chronic low back pain Neurological: No reported episodes of acute onset apraxia, aphasia, dysarthria, agnosia, amnesia, paralysis, loss of coordination, or loss of consciousness  Medication Review  azelastine, buPROPion, celecoxib, citalopram, furosemide, hydrOXYzine, ibuprofen, levorphanol, levothyroxine, loratadine, methylphenidate, metoprolol succinate, ondansetron, pantoprazole, predniSONE, pregabalin, traZODone, and vitamin B-12  History Review  Allergy: Connie [name] has No Known Allergies. Drug: Connie [name]  reports no history of drug use. Alcohol:  reports no history of alcohol use. Tobacco:  reports that she quit smoking about 12  years ago. Her smoking use included cigarettes. She has a 10.00 pack-year smoking history. She has never used smokeless tobacco. Social: Connie [name]  reports that she quit smoking about 12 years ago. Her smoking use included cigarettes. She has a 10.00 pack-year smoking history. She has never used smokeless tobacco. She reports that she does not drink alcohol and does not use drugs. Medical:  has a past medical history of Anemia, Anxiety, Arthritis, Cervical spondylosis with myelopathy (2018), Chronic pain (2019), DDD (degenerative disc disease), lumbar, Depression, Displacement of cervical intervertebral disc (2018), Dyspnea, Fibromyalgia, GERD (gastroesophageal reflux disease), Heart murmur, Hypertension, Hypothyroidism, Lumbar post-laminectomy syndrome (2018), Lumbosacral radiculitis (2018), Neuropathy due to medical condition ([place]), Osteoarthritis, Osteoporosis, Other long term (current) drug therapy (2019), Pain in the coccyx (2018), Spondylolisthesis (2018), Spondylosis of lumbosacral region without myelopathy or radiculopathy (2018), Spondylosis with myelopathy, lumbar region (2018), and Vitamin D deficiency. Surgical: Connie [name]  has a past surgical history that includes Cesarean section; Gastric bypass (2004); Lumbar fusion (2016); Spinal cord stimulator implant (2018); Cholecystectomy; Appendectomy; Tonsillectomy; Shoulder arthroscopy with rotator cuff repair (Right, 2008); Joint replacement (Bilateral, 2005); Abdominal hysterectomy (2000); epidural injection (2018); arm surgery (2002); Breast biopsy (Left, 2005); Colonoscopy with propofol (N/A, 08/25/2017); Temporal artery biopsy / ligation; and Cataract extraction w/PHACO (Right, 10/21/2017). Family: family history includes Anxiety disorder in her mother; Breast cancer in her cousin, maternal aunt, and mother; Cancer in her brother, father, and mother; Depression in her mother; Hypertension in her  father; Kidney disease in her mother.  Laboratory  Chemistry Profile   Renal Lab Results  Component Value Date   BUN 18 06/23/2017   CREATININE 0.60 06/23/2017   GFRAA >60 06/23/2017   GFRNONAA >60 06/23/2017     Hepatic Lab Results  Component Value Date   AST 20 06/23/2017   ALT 9 (L) 06/23/2017   ALBUMIN 3.6 06/23/2017   ALKPHOS 88 06/23/2017     Electrolytes Lab Results  Component Value Date   NA 135 06/23/2017   K 4.9 06/23/2017   CL 102 06/23/2017   CALCIUM 8.5 (L) 06/23/2017     Bone No results found for: VD25OH, VD125OH2TOT, NP0051TM2, TR1735AP0, 25OHVITD1, 25OHVITD2, 25OHVITD3, TESTOFREE, TESTOSTERONE   Inflammation (CRP: Acute Phase) (ESR: Chronic Phase) No results found for: CRP, ESRSEDRATE, LATICACIDVEN     Note: Above Lab results reviewed.  Recent Imaging Review  CT HEAD WO CONTRAST CLINICAL DATA:  Balance issues and repetitive motion.  EXAM: CT HEAD WITHOUT CONTRAST  TECHNIQUE: Contiguous axial images were obtained from the base of the skull through the vertex without intravenous contrast.  COMPARISON:  None.  FINDINGS: Brain: No evidence of acute infarction, hemorrhage, hydrocephalus, extra-axial collection or mass lesion/mass effect. Brain parenchyma has normal attenuation characteristics.  Vascular: No hyperdense vessel or unexpected calcification.  Skull: Normal. Negative for fracture or focal lesion.  Sinuses/Orbits: No acute finding.  Other: None.  IMPRESSION: Normal head CT.  Electronically Signed   By: Pedro Earls M.D.   On: 06/23/2019 10:17 Note: Reviewed        Physical Exam  General appearance: Well nourished, well developed, and well hydrated. In no apparent acute distress Mental status: Alert, oriented x 3 (person, place, & time)       Respiratory: No evidence of acute respiratory distress Eyes: PERLA Vitals: BP 116/63   Pulse 60   Temp (!) 97.5 F (36.4 C)   Resp 18   Ht 5' (1.524 m)   Wt 220 lb (99.8 kg)   SpO2 95%   BMI 42.97 kg/m  BMI:  Estimated body mass index is 42.97 kg/m as calculated from the following:   Height as of this encounter: 5' (1.524 m).   Weight as of this encounter: 220 lb (99.8 kg). Ideal: Ideal body weight: 45.5 kg (100 lb 4.9 oz) Adjusted ideal body weight: 67.2 kg (148 lb 3 oz)  Lumbar Spine Area Exam  Skin & Axial Inspection:Well healed scar from previous spine surgery detectedspinal cord stimulator in place with IPG present Alignment:Symmetrical Functional LID:CVUDTHYHO ROMaffecting both sides Stability:No instability detected Muscle Tone/Strength:Functionally intact. No obvious neuro-muscular anomalies detected. Sensory (Neurological):Dermatomal pain pattern Palpation:No palpable anomalies  Gait & Posture Assessment  Ambulation:Limited Gait:Antalgic gait (limping) Posture:Difficulty standing up straight, due to pain  Lower Extremity Exam    Side:Right lower extremity  Side:Left lower extremity  Stability:No instability observed  Stability:No instability observed  Skin & Extremity Inspection:Skin color, temperature, and hair growth are WNL. No peripheral edema or cyanosis. No masses, redness, swelling, asymmetry, or associated skin lesions. No contractures.  Skin & Extremity Inspection:Skin color, temperature, and hair growth are WNL. No peripheral edema or cyanosis. No masses, redness, swelling, asymmetry, or associated skin lesions. No contractures.  Functional OIL:[id number] ROMfor hip and knee joints   Functional ORV:[id number] ROMfor hip and knee joints   Muscle Tone/Strength:Functionally intact. No obvious neuro-muscular anomalies detected.  Muscle Tone/Strength:Functionally intact. No obvious neuro-muscular anomalies detected.  Sensory (Neurological):Dermatomal pain pattern  Sensory (Neurological):Dermatomal pain pattern  DTR: Patellar:deferred today Achilles:deferred today Plantar:deferred today   DTR: Patellar:deferred today Achilles:deferred today Plantar:deferred today  Palpation:No palpable anomalies  Palpation:No palpable anomalies     Assessment   Status Diagnosis  Persistent Persistent Persistent 1. Spondylosis of cervical region without myelopathy or radiculopathy   2. Lumbar degenerative disc disease   3. Lumbar radiculopathy   4. Spinal stenosis of lumbosacral region   5. Chronic pain syndrome   6. History of lumbar fusion   7. Lumbar spondylosis   8. Spinal cord stimulator status   9. Opioid type dependence, continuous ([place])       Plan of Care   Connie [name] has a current medication list which includes the following long-term medication(s): azelastine, levothyroxine, loratadine, methylphenidate, metoprolol succinate, pantoprazole, trazodone, azelastine, bupropion, bupropion, citalopram, methylphenidate, and pregabalin.  Pharmacotherapy (Medications Ordered): Meds ordered this encounter  Medications  . levorphanol (LEVODROMORAN) 2 MG tablet    Sig: 3 mg in am, 2 mg in afternoon, and 68m evening For chronic pain, 30 day Rx    Dispense:  105 tablet    Refill:  0  . levorphanol (LEVODROMORAN) 2 MG tablet    Sig: 3 mg in am, 2 mg in afternoon, and 238mevening For chronic pain, 30 day Rx    Dispense:  105 tablet    Refill:  0  . levorphanol (LEVODROMORAN) 2 MG tablet    Sig: 3 mg in am, 2 mg in afternoon, and 47m10mvening For chronic pain, 30 day Rx    Dispense:  105 tablet    Refill:  0   Recommend reaching out to neurology and discussing reduction in Lyrica 200 mg twice a day given side effects of dizziness and sedation.  Orders:  Orders Placed This Encounter  Procedures  . ToxASSURE Select 13 (MW), Urine    Volume: 30 ml(s). Minimum 3 ml of urine is needed. Document temperature of fresh sample. Indications: Long term (current) use of opiate analgesic (Z7(G28.366  Order Specific Question:   Release to patient    Answer:   Immediate    Follow-up plan:   Return in about 3 months (around 06/13/2020).    Recent Visits No visits were found meeting these conditions. Showing recent visits within past 90 days and meeting all other requirements Today's Visits Date Type Provider Dept  03/13/20 Office Visit [facility]ill[facility] SantaD [facility]  Showing today's visits and meeting all other requirements Future Appointments No visits were found meeting these conditions. Showing future appointments within next 90 days and meeting all other requirements  I discussed the assessment and treatment plan with the patient. The patient was provided an opportunity to ask questions and all were answered. The patient agreed with the plan and demonstrated an understanding of the instructions.  Patient advised to call back or seek an in-person evaluation if the symptoms or condition worsens.  Duration of encounter: 30 minutes.  Note by: BilGillis SantaD Date: 03/13/2020; Time: 3:11 PM

## 2020-03-15 ENCOUNTER — Other Ambulatory Visit: Payer: Self-pay | Admitting: Neurology

## 2020-03-15 DIAGNOSIS — M545 Low back pain, unspecified: Secondary | ICD-10-CM

## 2020-03-17 ENCOUNTER — Other Ambulatory Visit: Payer: Self-pay | Admitting: Psychiatry

## 2020-03-17 DIAGNOSIS — F3341 Major depressive disorder, recurrent, in partial remission: Secondary | ICD-10-CM

## 2020-03-19 LAB — TOXASSURE SELECT 13 (MW), URINE

## 2020-03-28 ENCOUNTER — Ambulatory Visit
Admission: RE | Admit: 2020-03-28 | Discharge: 2020-03-28 | Disposition: A | Discharge status: Home / Self Care | Payer: Managed Care, Other (non HMO) | Source: Ambulatory Visit | Attending: Neurology | Admitting: Neurology

## 2020-03-28 ENCOUNTER — Other Ambulatory Visit: Payer: Self-pay | Admitting: Psychiatry

## 2020-03-28 ENCOUNTER — Other Ambulatory Visit: Payer: Self-pay

## 2020-03-28 DIAGNOSIS — M545 Low back pain, unspecified: Secondary | ICD-10-CM | POA: Insufficient documentation

## 2020-03-28 DIAGNOSIS — F3341 Major depressive disorder, recurrent, in partial remission: Secondary | ICD-10-CM

## 2020-03-28 DIAGNOSIS — F5105 Insomnia due to other mental disorder: Secondary | ICD-10-CM

## 2020-04-04 ENCOUNTER — Other Ambulatory Visit: Payer: Self-pay | Admitting: Psychiatry

## 2020-04-04 DIAGNOSIS — F5105 Insomnia due to other mental disorder: Secondary | ICD-10-CM

## 2020-04-11 ENCOUNTER — Telehealth (INDEPENDENT_AMBULATORY_CARE_PROVIDER_SITE_OTHER): Payer: 59 | Admitting: Psychiatry

## 2020-04-11 ENCOUNTER — Other Ambulatory Visit: Payer: Self-pay

## 2020-04-11 ENCOUNTER — Encounter: Payer: Self-pay | Admitting: Psychiatry

## 2020-04-11 DIAGNOSIS — R440 Auditory hallucinations: Secondary | ICD-10-CM | POA: Diagnosis not present

## 2020-04-11 DIAGNOSIS — F5105 Insomnia due to other mental disorder: Secondary | ICD-10-CM | POA: Diagnosis not present

## 2020-04-11 DIAGNOSIS — F3342 Major depressive disorder, recurrent, in full remission: Secondary | ICD-10-CM

## 2020-04-11 DIAGNOSIS — F411 Generalized anxiety disorder: Secondary | ICD-10-CM

## 2020-04-11 NOTE — Progress Notes (Signed)
Virtual Visit via Video Note  I connected with Connie [name] on 04/11/20 at  2:40 PM EST by a video enabled telemedicine application and verified that I am speaking with the correct person using two identifiers.  Location Provider Location : [facility] Patient Location : Home  Participants: Patient , Provider   I discussed the limitations of evaluation and management by telemedicine and the availability of in person appointments. The patient expressed understanding and agreed to proceed.     I discussed the assessment and treatment plan with the patient. The patient was provided an opportunity to ask questions and all were answered. The patient agreed with the plan and demonstrated an understanding of the instructions.   The patient was advised to call back or seek an in-person evaluation if the symptoms worsen or if the condition fails to improve as anticipated.  Connie [name] OP Progress Note  04/11/2020 3:06 PM Connie [name]  MRN:  [id number]  Chief Complaint:  Chief Complaint    Follow-up     HPI: Connie [name] is a 64 year old Caucasian female, married, on SSD, lives in [place], has a history of MDD, GAD, insomnia, fibromyalgia, hypersomnia, chronic pain, hypothyroidism, polyarthralgia was evaluated by telemedicine today.  Patient today reports she is currently doing well with regards to her mood.  Denies any significant sadness, crying spells.  Patient denies any anxiety attacks.  She reports sleep is good on the trazodone.  She currently is on methylphenidate during the day which helps her to stay awake.  She reports this was started by her neurologist.  She continues to struggle with memory problems.  She however has not yet made the appointment to be evaluated at the memory clinic with [facility] as per request by her neurologist.  She is still waiting to hear back from them.  Patient reports she has been having auditory hallucinations of hearing music since the past  3 to 4 months.  She reports this started even before she started the methylphenidate and it is not related to that. She reports it does not distress her much and it does not happen all the time.  She also wonders whether it is due to her hearing problems.  She hence does not want to be on an antipsychotic medication at this time.  She wants to wait and watch.  Patient denies any suicidality, homicidality.  Visit Diagnosis:    ICD-10-CM   1. MDD (major depressive disorder), recurrent, in full remission ([place])  F33.42   2. GAD (generalized anxiety disorder)  F41.1   3. Insomnia due to mental disorder  F51.05     Past Psychiatric History: I have reviewed past psychiatric history from my progress note on 08/06/2017.  Past trials of Latuda, Wellbutrin, Cymbalta  Past Medical History:  Past Medical History:  Diagnosis Date  . Anemia    in the past  . Anxiety   . Arthritis    all over  . Cervical spondylosis with myelopathy 2018  . Chronic pain 2019   can't stand straight, uses walker for stability  . DDD (degenerative disc disease), lumbar   . Depression   . Displacement of cervical intervertebral disc 2018  . Dyspnea    HANDICAPPED  . Fibromyalgia    Polymyalgia Rheumatica  . GERD (gastroesophageal reflux disease)   . Heart murmur    not treated or being followed  . Hypertension    CONTROLLED  . Hypothyroidism   . Lumbar post-laminectomy syndrome 2018  .  Lumbosacral radiculitis 2018  . Neuropathy due to medical condition (HCC)    feet, toes, fingers  . Osteoarthritis   . Osteoporosis   . Other long term (current) drug therapy 2019   from pain management  . Pain in the coccyx 2018   disorder of sacrum  . Spondylolisthesis 2018  . Spondylosis of lumbosacral region without myelopathy or radiculopathy 2018  . Spondylosis with myelopathy, lumbar region 2018  . Vitamin D deficiency     Past Surgical History:  Procedure Laterality Date  . ABDOMINAL HYSTERECTOMY  2000  .  APPENDECTOMY    . arm surgery  2002  . BREAST BIOPSY Left 2005   benign  . CATARACT EXTRACTION W/PHACO Right 10/21/2017   Procedure: CATARACT EXTRACTION PHACO AND INTRAOCULAR LENS PLACEMENT ([place]) RIGHT;  Surgeon: Leandrew Koyanagi, [name];  Location: [facility];  Service: Ophthalmology;  Laterality: Right;  neck issues  . CESAREAN SECTION     x 2  . CHOLECYSTECTOMY    . COLONOSCOPY WITH PROPOFOL N/A 08/25/2017   Procedure: COLONOSCOPY WITH PROPOFOL;  Surgeon: Tol[place], Benay Pike, [name];  Location: [facility];  Service: Gastroenterology;  Laterality: N/A;  . epidural injection  2018   steroids  . GASTRIC BYPASS  2004  . JOINT REPLACEMENT Bilateral 2005   knees  . LUMBAR FUSION  2016   x 2/ L3-5  . SHOULDER ARTHROSCOPY WITH ROTATOR CUFF REPAIR Right 2008   x 3  . SPINAL CORD STIMULATOR IMPLANT  2018   [facility]  . TEMPORAL ARTERY BIOPSY / LIGATION    . TONSILLECTOMY      Family Psychiatric History: I have reviewed family psychiatric history from my progress note on 08/06/2017  Family History:  Family History  Problem Relation Age of Onset  . Kidney disease Mother   . Cancer Mother   . Anxiety disorder Mother   . Depression Mother   . Breast cancer Mother   . Cancer Father   . Hypertension Father   . Cancer Brother   . Breast cancer Maternal Aunt   . Breast cancer Cousin     Social History: Reviewed social history from my progress note on 08/06/2017 Social History   Socioeconomic History  . Marital status: Married    Spouse name: brady   . Number of children: 2  . Years of education: Not on file  . Highest education level: High school graduate  Occupational History    Comment: disabled   Tobacco Use  . Smoking status: Former Smoker    Packs/day: 0.50    Years: 20.00    Pack years: 10.00    Types: Cigarettes    Quit date: 05/21/2007    Years since quitting: 12.9  . Smokeless tobacco: Never Used  Vaping Use  . Vaping Use: Never used  Substance and  Sexual Activity  . Alcohol use: No  . Drug use: No    Comment: managed by pain clinic  . Sexual activity: Not Currently  Other Topics Concern  . Not on file  Social History Narrative  . Not on file   Social Determinants of Health   Financial Resource Strain: Not on file  Food Insecurity: Not on file  Transportation Needs: Not on file  Physical Activity: Not on file  Stress: Not on file  Social Connections: Not on file    Allergies: No Known Allergies  Metabolic Disorder Labs: No results found for: HGBA1C, MPG No results found for: PROLACTIN No results found for: CHOL, TRIG,  HDL, CHOLHDL, VLDL, LDLCALC No results found for: TSH  Therapeutic Level Labs: No results found for: LITHIUM No results found for: VALPROATE No components found for:  CBMZ  Current Medications: Current Outpatient Medications  Medication Sig Dispense Refill  . azelastine (ASTELIN) 0.1 % nasal spray Place into the nose.    Marland Kitchen azelastine (ASTELIN) 0.1 % nasal spray U 1 SPR IEN BID    . buPROPion (WELLBUTRIN XL) 150 MG 24 hr tablet Take 1 tablet (150 mg total) by mouth daily. To be combined with 300 mg 90 tablet 1  . buPROPion (WELLBUTRIN XL) 300 MG 24 hr tablet Take 1 tablet (300 mg total) by mouth daily. To be combined with 150 mg 90 tablet 1  . celecoxib (CELEBREX) 200 MG capsule Take 200 mg by mouth 2 (two) times daily. AM    . citalopram (CELEXA) 20 MG tablet TAKE 1 AND 1/2 TABLETS(30 MG) BY MOUTH DAILY 135 tablet 0  . furosemide (LASIX) 20 MG tablet Take 20 mg by mouth as needed.    . hydrOXYzine (VISTARIL) 25 MG capsule TAKE ONE CAPSULE BY MOUTH THREE TIMES DAILY AS NEEDED FOR SEVERE ANXIETY AS WELL AS FOR SLEEP 90 capsule 2  . ibuprofen (ADVIL,MOTRIN) 200 MG tablet Take 400 mg by mouth daily as needed for headache or moderate pain.    Marland Kitchen levorphanol (LEVODROMORAN) 2 MG tablet 3 mg in am, 2 mg in afternoon, and 2mg  evening For chronic pain, 30 day Rx 105 tablet 0  . levorphanol (LEVODROMORAN) 2 MG  tablet 3 mg in am, 2 mg in afternoon, and 2mg  evening For chronic pain, 30 day Rx 105 tablet 0  . [START ON 05/12/2020] levorphanol (LEVODROMORAN) 2 MG tablet 3 mg in am, 2 mg in afternoon, and 2mg  evening For chronic pain, 30 day Rx 105 tablet 0  . levothyroxine (SYNTHROID) 50 MCG tablet     . loratadine (CLARITIN) 10 MG tablet Take 10 mg by mouth daily as needed for allergies.    . methylphenidate (RITALIN) 5 MG tablet Take by mouth.    . methylphenidate (RITALIN) 5 MG tablet Take 5 mg by mouth daily.    . metoprolol succinate (TOPROL-XL) 100 MG 24 hr tablet Take 100 mg by mouth once daily in the evening  0  . ondansetron (ZOFRAN-ODT) 8 MG disintegrating tablet Take 8 mg by mouth daily as needed for nausea or vomiting.    . pantoprazole (PROTONIX) 40 MG tablet TAKE 1 TABLET(40 MG) BY MOUTH EVERY DAY    . predniSONE (DELTASONE) 20 MG tablet Take 20 mg by mouth 2 (two) times daily.    . pregabalin (LYRICA) 100 MG capsule Take 100 mg by mouth 3 (three) times daily.    . traZODone (DESYREL) 100 MG tablet TAKE 1 TO 2 TABLETS(100 TO 200 MG) BY MOUTH AT BEDTIME AS NEEDED FOR SLEEP 180 tablet 1  . vitamin B-12 (CYANOCOBALAMIN) 1000 MCG tablet Take by mouth.     No current facility-administered medications for this visit.     Musculoskeletal: Strength & Muscle Tone: UTA Gait & Station: UTA Patient leans: N/A  Psychiatric Specialty Exam: Review of Systems  Psychiatric/Behavioral: Positive for hallucinations.  All other systems reviewed and are negative.   There were no vitals taken for this visit.There is no height or weight on file to calculate BMI.  General Appearance: Casual  Eye Contact:  Fair  Speech:  Clear and Coherent  Volume:  Normal  Mood:  Euthymic  Affect:  Congruent  Thought Process:  Goal Directed and Descriptions of Associations: Intact  Orientation:  Full (Time, Place, and Person)  Thought Content: Hallucinations: Auditory Hear music on and off- does not distress her   Suicidal Thoughts:  No  Homicidal Thoughts:  No  Memory:  Immediate;   Fair Recent;   Fair Remote;   Fair does report short term memory loss  Judgement:  Fair  Insight:  Fair  Psychomotor Activity:  Normal  Concentration:  Concentration: Fair and Attention Span: Fair  Recall:  [employer] of Knowledge: Fair  Language: Fair  Akathisia:  No  Handed:  Right  AIMS (if indicated): UTA  Assets:  Communication Skills Desire for Improvement Housing Social Support  ADL's:  Intact  Cognition: WNL  Sleep:  Fair   Screenings: PHQ2-9   [place] Office Visit from 03/13/2020 in [place] Office Visit from 12/13/2019 in [facility] Office Visit from 07/13/2018 in [facility] Clinical Support from 04/15/2018 in [facility] Clinical Support from 01/27/2018 in [place]  PHQ-2 Total Score 1 0 0 0 0       Assessment and Plan: Darria Corvera is a 64 year old Caucasian female who has a history of depression, chronic pain, GERD, hypothyroidism, hypertension, fibromyalgia was evaluated by telemedicine today.  Patient with psychosocial stressors of health issues, the pandemic.  Patient is currently stable on current medication regimen however does report auditory hallucinations although is not distressing.  Plan as noted below.  Plan MDD in remission Wellbutrin XL 450 mg p.o. daily Celexa 30 mg p.o. daily   GAD-stable Celexa as prescribed Hydroxyzine 25 mg p.o. 3 times daily as needed  Insomnia-improving Trazodone 100 mg p.o. nightly as needed CPAP for OSA  Patient with auditory hallucinations is not interested in starting an antipsychotic medication, she was advised to get her hearing tested as well as to follow-up with neurology as recommended.  Follow-up in clinic  in 2 months or sooner if needed.  I have spent atleast 20 minutes face to face by video with patient today. More than 50 % of the time was spent for preparing to see the patient ( e.g., review of test, records ),  ordering medications and test ,psychoeducation and supportive psychotherapy and care coordination,as well as documenting clinical information in electronic health record. This note was generated in part or whole with voice recognition software. Voice recognition is usually quite accurate but there are transcription errors that can and very often do occur. I apologize for any typographical errors that were not detected and corrected.       Ursula Alert, [name] 04/12/2020, 9:31 AM

## 2020-05-02 ENCOUNTER — Ambulatory Visit: Payer: Managed Care, Other (non HMO) | Admitting: Physical Therapy

## 2020-05-08 ENCOUNTER — Ambulatory Visit: Payer: Managed Care, Other (non HMO) | Attending: Neurology | Admitting: Physical Therapy

## 2020-05-10 ENCOUNTER — Encounter: Payer: Managed Care, Other (non HMO) | Admitting: Physical Therapy

## 2020-05-17 ENCOUNTER — Encounter: Payer: Managed Care, Other (non HMO) | Admitting: Physical Therapy

## 2020-05-22 ENCOUNTER — Encounter: Payer: Managed Care, Other (non HMO) | Admitting: Physical Therapy

## 2020-05-23 ENCOUNTER — Other Ambulatory Visit: Payer: Self-pay | Admitting: Internal Medicine

## 2020-05-23 DIAGNOSIS — Z1231 Encounter for screening mammogram for malignant neoplasm of breast: Secondary | ICD-10-CM

## 2020-05-24 ENCOUNTER — Encounter: Payer: Managed Care, Other (non HMO) | Admitting: Physical Therapy

## 2020-05-29 ENCOUNTER — Encounter: Payer: Managed Care, Other (non HMO) | Admitting: Physical Therapy

## 2020-05-31 ENCOUNTER — Encounter: Payer: Managed Care, Other (non HMO) | Admitting: Physical Therapy

## 2020-06-11 ENCOUNTER — Telehealth (INDEPENDENT_AMBULATORY_CARE_PROVIDER_SITE_OTHER): Payer: 59 | Admitting: Psychiatry

## 2020-06-11 ENCOUNTER — Other Ambulatory Visit: Payer: Self-pay

## 2020-06-11 DIAGNOSIS — Z5329 Procedure and treatment not carried out because of patient's decision for other reasons: Secondary | ICD-10-CM

## 2020-06-11 NOTE — Progress Notes (Signed)
No response to call or text or video invite  

## 2020-06-12 ENCOUNTER — Ambulatory Visit
Payer: Managed Care, Other (non HMO) | Attending: Student in an Organized Health Care Education/Training Program | Admitting: Student in an Organized Health Care Education/Training Program

## 2020-06-12 ENCOUNTER — Encounter: Payer: Self-pay | Admitting: Student in an Organized Health Care Education/Training Program

## 2020-06-12 VITALS — BP 155/75 | HR 68 | Temp 97.2°F | Resp 16 | Ht 60.0 in | Wt 220.0 lb

## 2020-06-12 DIAGNOSIS — F1120 Opioid dependence, uncomplicated: Secondary | ICD-10-CM | POA: Insufficient documentation

## 2020-06-12 DIAGNOSIS — G894 Chronic pain syndrome: Secondary | ICD-10-CM | POA: Diagnosis present

## 2020-06-12 DIAGNOSIS — Z9689 Presence of other specified functional implants: Secondary | ICD-10-CM | POA: Insufficient documentation

## 2020-06-12 DIAGNOSIS — M47812 Spondylosis without myelopathy or radiculopathy, cervical region: Secondary | ICD-10-CM | POA: Diagnosis not present

## 2020-06-12 DIAGNOSIS — M5416 Radiculopathy, lumbar region: Secondary | ICD-10-CM | POA: Insufficient documentation

## 2020-06-12 DIAGNOSIS — M4807 Spinal stenosis, lumbosacral region: Secondary | ICD-10-CM | POA: Diagnosis present

## 2020-06-12 DIAGNOSIS — M5136 Other intervertebral disc degeneration, lumbar region: Secondary | ICD-10-CM | POA: Diagnosis present

## 2020-06-12 DIAGNOSIS — Z981 Arthrodesis status: Secondary | ICD-10-CM | POA: Insufficient documentation

## 2020-06-12 DIAGNOSIS — M47816 Spondylosis without myelopathy or radiculopathy, lumbar region: Secondary | ICD-10-CM | POA: Insufficient documentation

## 2020-06-12 MED ORDER — LEVORPHANOL TARTRATE 2 MG PO TABS: 2.0000 mg | ORAL_TABLET | Freq: Three times a day (TID) | ORAL | 0 refills | Status: AC | PRN

## 2020-06-12 MED ORDER — LEVORPHANOL TARTRATE 2 MG PO TABS: 2.0000 mg | ORAL_TABLET | Freq: Three times a day (TID) | ORAL | 0 refills | Status: DC | PRN

## 2020-06-12 NOTE — Progress Notes (Signed)
Nursing Pain Medication Assessment:  Safety precautions to be maintained throughout the outpatient stay will include: orient to surroundings, keep bed in low position, maintain call bell within reach at all times, provide assistance with transfer out of bed and ambulation.  Medication Inspection Compliance: Pill count conducted under aseptic conditions, in front of the patient. Neither the pills nor the bottle was removed from the patient's sight at any time. Once count was completed pills were immediately returned to the patient in their original bottle.  Medication: levorphanol Pill/Patch Count: 35 of 105 pills remain Pill/Patch Appearance: Markings consistent with prescribed medication Bottle Appearance: Standard pharmacy container. Clearly labeled. Filled Date:05/29/2020 Last Medication intake:  Today

## 2020-06-12 NOTE — Progress Notes (Addendum)
PROVIDER NOTE: Information contained herein reflects review and annotations entered in association with encounter. Interpretation of such information and data should be left to medically-trained personnel. Information provided to patient can be located elsewhere in the medical record under "Patient Instructions". Document created using STT-dictation technology, any transcriptional errors that may result from process are unintentional.    Patient: Connie [name]  Service Category: E/M  Provider: [name], MD  DOB: 1955/06/21  DOS: 06/12/2020  Specialty: Interventional Pain Management  MRN: [id number]  Setting: Ambulatory outpatient  PCP: Tracie Harrier, MD  Type: Established Patient    Referring Provider: Tracie Harrier, MD  Location: Office  Delivery: Face-to-face     HPI  [name], a 65 y.o. year old female, is here today because of her Lumbar degenerative disc disease [M51.36]. [name] primary complain today is Back Pain (lower) Last encounter: My last encounter with her was on 03/13/2020. Pertinent problems: [name] has Degenerative joint disease of cervical and lumbar spine; Lumbar spondylosis; History of lumbar fusion; Lumbar degenerative disc disease; Fibromyalgia; Chronic pain syndrome; Spinal cord stimulator status; Acquired spondylolisthesis; Chronic pain; Generalized osteoarthritis of multiple sites; Spinal stenosis of lumbar region without neurogenic claudication; and Lumbar post-laminectomy syndrome on their pertinent problem list. Pain Assessment: Severity of Chronic pain is reported as a 7 /10. Location: Back Lower/both legs, worse on left. Onset: More than a month ago. Quality: Numbness,Tightness. Timing: Constant. Modifying factor(s): medications. Vitals:  height is 5' (1.524 m) and weight is 220 lb (99.8 kg). Her temporal temperature is 97.2 F (36.2 C) (abnormal). Her blood pressure is 155/75 (abnormal) and her pulse is 68. Her respiration is 16 and  oxygen saturation is 95%.   Reason for encounter: medication management.    [name] presents today for medication management.  No significant change in her medical history since her last visit.  She continues to endorse trouble with memory and cognition.  She states that she is often confused and has difficulty recalling events even from a couple of days ago.  I encouraged her to reach out to her neurologist to further discuss.  They may need to get a brain MRI.  She states that she will contact them.  In the interim, I have suggested that we reduce her levorphanol frequency to see if that helps out with her memory/confusion.  Of note patient has been on levorphanol for over 10 years.  She was prescribed this medication even before I took her on as a chronic pain patient.  Of note she has a Materials engineer in place that is now recharging.  I have contacted the spinal cord stimulator representative to have her touch base with [facility] so that they can troubleshoot and hopefully optimize her settings so that she can utilize her spinal cord stimulator.  Pharmacotherapy Assessment   Analgesic: Reduce levorphanol to 2 mg 3 times daily as needed, quantity 90/month (from quantity 105/month) Monitoring: [place] PMP: PDMP reviewed during this encounter.       Pharmacotherapy: No side-effects or adverse reactions reported. Compliance: No problems identified. Effectiveness: Clinically acceptable.  Landis Martins, RN  06/12/2020  2:38 PM  Sign when Signing Visit Nursing Pain Medication Assessment:  Safety precautions to be maintained throughout the outpatient stay will include: orient to surroundings, keep bed in low position, maintain call bell within reach at all times, provide assistance with transfer out of bed and ambulation.  Medication Inspection Compliance: Pill count conducted under aseptic conditions, in front  of the patient. Neither the pills nor the bottle was removed from the  patient's sight at any time. Once count was completed pills were immediately returned to the patient in their original bottle.  Medication: levorphanol Pill/Patch Count: 35 of 105 pills remain Pill/Patch Appearance: Markings consistent with prescribed medication Bottle Appearance: Standard pharmacy container. Clearly labeled. Filled Date:05/29/2020 Last Medication intake:  Today    UDS:  Summary  Date Value Ref Range Status  03/13/2020 Note  Final    Comment:    ==================================================================== [employer] Select 13 (MW) ==================================================================== Test                             Result       Flag       Units    NO DRUGS DETECTED. ==================================================================== Test                      Result    Flag   Units      Ref Range   Creatinine              190              mg/dL      >=20 ==================================================================== Declared Medications:  The flagging and interpretation on this report are based on the  following declared medications.  Unexpected results may arise from  inaccuracies in the declared medications.   **Note: The testing scope of this panel does not include the  following reported medications:   Azelastine (Astelin)  Bupropion (Wellbutrin XL)  Celecoxib (Celebrex)  Citalopram (Celexa)  Cyanocobalamin  Furosemide (Lasix)  Hydroxyzine (Vistaril)  Ibuprofen (Advil)  Levorphanol  Levothyroxine (Synthroid)  Loratadine (Claritin)  Methylphenidate (Ritalin)  Metoprolol  Ondansetron  Pantoprazole (Protonix)  Prednisone (Deltasone)  Pregabalin (Lyrica)  Trazodone (Desyrel) ==================================================================== For clinical consultation, please call [phone]. ====================================================================      ROS  Constitutional: Denies any fever or  chills Gastrointestinal: No reported hemesis, hematochezia, vomiting, or acute GI distress Musculoskeletal: Positive low back pain Neurological: No reported episodes of acute onset apraxia, aphasia, dysarthria, agnosia, amnesia, paralysis, loss of coordination, or loss of consciousness  Medication Review  azelastine, buPROPion, celecoxib, citalopram, furosemide, hydrOXYzine, ibuprofen, levorphanol, levothyroxine, loratadine, methylphenidate, metoprolol succinate, ondansetron, pantoprazole, pregabalin, traZODone, and vitamin B-12  History Review  Allergy: [name] has No Known Allergies. Drug: [name]  reports no history of drug use. Alcohol:  reports no history of alcohol use. Tobacco:  reports that she quit smoking about 13 years ago. Her smoking use included cigarettes. She has a 10.00 pack-year smoking history. She has never used smokeless tobacco. Social: [name]  reports that she quit smoking about 13 years ago. Her smoking use included cigarettes. She has a 10.00 pack-year smoking history. She has never used smokeless tobacco. She reports that she does not drink alcohol and does not use drugs. Medical:  has a past medical history of Anemia, Anxiety, Arthritis, Cervical spondylosis with myelopathy (2018), Chronic pain (2019), DDD (degenerative disc disease), lumbar, Depression, Displacement of cervical intervertebral disc (2018), Dyspnea, Fibromyalgia, GERD (gastroesophageal reflux disease), Heart murmur, Hypertension, Hypothyroidism, Lumbar post-laminectomy syndrome (2018), Lumbosacral radiculitis (2018), Neuropathy due to medical condition ([place]), Osteoarthritis, Osteoporosis, Other long term (current) drug therapy (2019), Pain in the coccyx (2018), Spondylolisthesis (2018), Spondylosis of lumbosacral region without myelopathy or radiculopathy (2018), Spondylosis with myelopathy, lumbar region (2018), and Vitamin D deficiency. Surgical: [name]  has a past  surgical history that  includes Cesarean section; Gastric bypass (2004); Lumbar fusion (2016); Spinal cord stimulator implant (2018); Cholecystectomy; Appendectomy; Tonsillectomy; Shoulder arthroscopy with rotator cuff repair (Right, 2008); Joint replacement (Bilateral, 2005); Abdominal hysterectomy (2000); epidural injection (2018); arm surgery (2002); Breast biopsy (Left, 2005); Colonoscopy with propofol (N/A, 08/25/2017); Temporal artery biopsy / ligation; and Cataract extraction w/PHACO (Right, 10/21/2017). Family: family history includes Anxiety disorder in her mother; Breast cancer in her cousin, maternal aunt, and mother; Cancer in her brother, father, and mother; Depression in her mother; Hypertension in her father; Kidney disease in her mother.  Laboratory Chemistry Profile   Renal Lab Results  Component Value Date   BUN 18 06/23/2017   CREATININE 0.60 06/23/2017   GFRAA >60 06/23/2017   GFRNONAA >60 06/23/2017     Hepatic Lab Results  Component Value Date   AST 20 06/23/2017   ALT 9 (L) 06/23/2017   ALBUMIN 3.6 06/23/2017   ALKPHOS 88 06/23/2017     Electrolytes Lab Results  Component Value Date   NA 135 06/23/2017   K 4.9 06/23/2017   CL 102 06/23/2017   CALCIUM 8.5 (L) 06/23/2017     Bone No results found for: VD25OH, VD125OH2TOT, MW1027OZ3, GU4403KV4, 25OHVITD1, 25OHVITD2, 25OHVITD3, TESTOFREE, TESTOSTERONE   Inflammation (CRP: Acute Phase) (ESR: Chronic Phase) No results found for: CRP, ESRSEDRATE, LATICACIDVEN     Note: Above Lab results reviewed.  Recent Imaging Review  CT LUMBAR SPINE WO CONTRAST CLINICAL DATA:  Low back pain radiating to the left leg.  EXAM: CT LUMBAR SPINE WITHOUT CONTRAST  TECHNIQUE: Multidetector CT imaging of the lumbar spine was performed without intravenous contrast administration. Multiplanar CT image reconstructions were also generated.  COMPARISON:  07/24/2017  FINDINGS: Segmentation: 5 lumbar type vertebrae.  Alignment: Unchanged grade 2  anterolisthesis at L4-5  Vertebrae: L3-5 posterior instrumented fusion. New depression of the superior endplate of L4.  Paraspinal and other soft tissues: Calcific aortic atherosclerosis. Spinal stimulator enters the epidural space at the T8-9 level.  Disc levels: No spinal canal stenosis above L4-5. Mild spinal canal narrowing at L4-5 is unchanged. Moderate right and mild left L4-5 foraminal stenosis.  At L5-S1, there is disc space narrowing with endplate remodeling. No spinal canal stenosis. Moderate bilateral foraminal stenosis.  IMPRESSION: 1. New depression of the superior endplate of L4, likely degenerative. 2. Unchanged grade 2 anterolisthesis at L4-5 with mild spinal canal narrowing. 3. Unchanged moderate right and mild left L4-5 foraminal stenosis. 4. Unchanged moderate bilateral L5-S1 foraminal stenosis.  Aortic Atherosclerosis (ICD10-I70.0).  Electronically Signed   By: Ulyses Jarred M.D.   On: 03/28/2020 22:39 Note: Reviewed        Physical Exam  General appearance: Well nourished, well developed, and well hydrated. In no apparent acute distress Mental status: Alert, oriented x 3 (person, place, & time)       confused at times regarding her medications and her medical history Respiratory: No evidence of acute respiratory distress Eyes: PERLA Vitals: BP (!) 155/75   Pulse 68   Temp (!) 97.2 F (36.2 C) (Temporal)   Resp 16   Ht 5' (1.524 m)   Wt 220 lb (99.8 kg)   SpO2 95%   BMI 42.97 kg/m  BMI: Estimated body mass index is 42.97 kg/m as calculated from the following:   Height as of this encounter: 5' (1.524 m).   Weight as of this encounter: 220 lb (99.8 kg). Ideal: Ideal body weight: 45.5 kg (100 lb 4.9 oz) Adjusted ideal body  weight: 67.2 kg (148 lb 3 oz)  Lumbar Spine Area Exam  Skin & Axial Inspection:Well healed scar from previous spine surgery detectedspinal cord stimulator in place with IPG present Alignment:Symmetrical Functional  NWG:NFAOZHYQM ROMaffecting both sides Stability:No instability detected Muscle Tone/Strength:Functionally intact. No obvious neuro-muscular anomalies detected. Sensory (Neurological):Dermatomal pain pattern Palpation:No palpable anomalies  Gait & Posture Assessment  Ambulation:Limited Gait:Antalgic gait (limping) Posture:Difficulty standing up straight, due to pain  Lower Extremity Exam    Side:Right lower extremity  Side:Left lower extremity  Stability:No instability observed  Stability:No instability observed  Skin & Extremity Inspection:Skin color, temperature, and hair growth are WNL. No peripheral edema or cyanosis. No masses, redness, swelling, asymmetry, or associated skin lesions. No contractures.  Skin & Extremity Inspection:Skin color, temperature, and hair growth are WNL. No peripheral edema or cyanosis. No masses, redness, swelling, asymmetry, or associated skin lesions. No contractures.  Functional VHQ:IONGEXBMW ROMfor hip and knee joints   Functional UXL:[id number] ROMfor hip and knee joints   Muscle Tone/Strength:Functionally intact. No obvious neuro-muscular anomalies detected.  Muscle Tone/Strength:Functionally intact. No obvious neuro-muscular anomalies detected.  Sensory (Neurological):Dermatomal pain pattern  Sensory (Neurological):Dermatomal pain pattern  DTR: Patellar:deferred today Achilles:deferred today Plantar:deferred today  DTR: Patellar:deferred today Achilles:deferred today Plantar:deferred today  Palpation:No palpable anomalies  Palpation:No palpable anomalies    Assessment   Status Diagnosis  Persistent Persistent Persistent 1. Lumbar degenerative disc disease   2. Lumbar radiculopathy   3. Spinal stenosis of lumbosacral region   4. Spondylosis of cervical region without myelopathy or radiculopathy   5. Spinal cord stimulator status   6. History of lumbar  fusion   7. Opioid type dependence, continuous ([place])   8. Lumbar spondylosis   9. Chronic pain syndrome       Plan of Care  Ms. Estefani Bateson Milleson has a current medication list which includes the following long-term medication(s): azelastine, azelastine, bupropion, bupropion, citalopram, levothyroxine, loratadine, methylphenidate, metoprolol succinate, pantoprazole, pregabalin, and trazodone.  Pharmacotherapy (Medications Ordered): Meds ordered this encounter  Medications  . levorphanol (LEVODROMORAN) 2 MG tablet    Sig: Take 1 tablet (2 mg total) by mouth every 8 (eight) hours as needed for pain.    Dispense:  90 tablet    Refill:  0    For chronic pain syndrome  . levorphanol (LEVODROMORAN) 2 MG tablet    Sig: Take 1 tablet (2 mg total) by mouth every 8 (eight) hours as needed for pain.    Dispense:  90 tablet    Refill:  0    For chronic pain syndrome   I have contacted [place] spinal cord stimulator representative to hopefully have her reach out to patient to optimize settings, troubleshoot charging error. I have encouraged her to follow-up with neurology to further work-up her continued mental status and cognitive impairment specifically trouble with memory recall.  Consider brain MRI.  Follow-up plan:   Return in about 10 weeks (around 08/21/2020) for Medication Management, in person.   Recent Visits No visits were found meeting these conditions. Showing recent visits within past 90 days and meeting all other requirements Today's Visits Date Type Provider Dept  06/12/20 Office Visit [name], MD [facility]  Showing today's visits and meeting all other requirements Future Appointments No visits were found meeting these conditions. Showing future appointments within next 90 days and meeting all other requirements  I discussed the assessment and treatment plan with the patient. The patient was provided an opportunity to ask questions and all were  answered. The patient agreed with the plan and demonstrated an understanding of the instructions.  Patient advised to call back or seek an in-person evaluation if the symptoms or condition worsens.  Duration of encounter: 24mnutes.  Note by: BGillis Santa MD Date: 06/12/2020; Time: 2:58 PM

## 2020-06-14 ENCOUNTER — Other Ambulatory Visit: Payer: Self-pay | Admitting: Psychiatry

## 2020-06-14 DIAGNOSIS — F3341 Major depressive disorder, recurrent, in partial remission: Secondary | ICD-10-CM

## 2020-06-18 ENCOUNTER — Other Ambulatory Visit: Payer: Self-pay

## 2020-06-18 ENCOUNTER — Ambulatory Visit
Admission: RE | Admit: 2020-06-18 | Discharge: 2020-06-18 | Disposition: A | Discharge status: Home / Self Care | Payer: Managed Care, Other (non HMO) | Source: Ambulatory Visit | Attending: Internal Medicine | Admitting: Internal Medicine

## 2020-06-18 DIAGNOSIS — Z1231 Encounter for screening mammogram for malignant neoplasm of breast: Secondary | ICD-10-CM | POA: Insufficient documentation

## 2020-07-03 ENCOUNTER — Other Ambulatory Visit: Payer: Self-pay | Admitting: Psychiatry

## 2020-07-03 DIAGNOSIS — F5105 Insomnia due to other mental disorder: Secondary | ICD-10-CM

## 2020-07-26 ENCOUNTER — Other Ambulatory Visit: Payer: Self-pay | Admitting: Psychiatry

## 2020-07-26 DIAGNOSIS — F5105 Insomnia due to other mental disorder: Secondary | ICD-10-CM

## 2020-08-02 ENCOUNTER — Telehealth: Payer: Self-pay

## 2020-08-02 NOTE — Telephone Encounter (Signed)
citalopram 20mg  was apprvoed from 07-31-20 to 07-31-21

## 2020-08-07 ENCOUNTER — Other Ambulatory Visit: Payer: Self-pay

## 2020-08-07 ENCOUNTER — Ambulatory Visit
Payer: Managed Care, Other (non HMO) | Attending: Student in an Organized Health Care Education/Training Program | Admitting: Student in an Organized Health Care Education/Training Program

## 2020-08-07 ENCOUNTER — Encounter: Payer: Self-pay | Admitting: Student in an Organized Health Care Education/Training Program

## 2020-08-07 VITALS — BP 138/62 | HR 64 | Temp 97.0°F | Resp 18 | Ht 60.0 in | Wt 230.0 lb

## 2020-08-07 DIAGNOSIS — M4807 Spinal stenosis, lumbosacral region: Secondary | ICD-10-CM | POA: Diagnosis not present

## 2020-08-07 DIAGNOSIS — M5416 Radiculopathy, lumbar region: Secondary | ICD-10-CM | POA: Diagnosis not present

## 2020-08-07 DIAGNOSIS — G894 Chronic pain syndrome: Secondary | ICD-10-CM

## 2020-08-07 DIAGNOSIS — M47812 Spondylosis without myelopathy or radiculopathy, cervical region: Secondary | ICD-10-CM

## 2020-08-07 DIAGNOSIS — M51369 Other intervertebral disc degeneration, lumbar region without mention of lumbar back pain or lower extremity pain: Secondary | ICD-10-CM

## 2020-08-07 DIAGNOSIS — M5136 Other intervertebral disc degeneration, lumbar region: Secondary | ICD-10-CM | POA: Diagnosis not present

## 2020-08-07 MED ORDER — CELECOXIB 100 MG PO CAPS: 100.0000 mg | ORAL_CAPSULE | Freq: Two times a day (BID) | ORAL | 2 refills | Status: DC | PRN

## 2020-08-07 MED ORDER — LEVORPHANOL TARTRATE 2 MG PO TABS: 2.0000 mg | ORAL_TABLET | Freq: Three times a day (TID) | ORAL | 0 refills | Status: AC | PRN

## 2020-08-07 MED ORDER — LEVORPHANOL TARTRATE 2 MG PO TABS: 2.0000 mg | ORAL_TABLET | Freq: Three times a day (TID) | ORAL | 0 refills | Status: DC | PRN

## 2020-08-07 NOTE — Progress Notes (Signed)
PROVIDER NOTE: Information contained herein reflects review and annotations entered in association with encounter. Interpretation of such information and data should be left to medically-trained personnel. Information provided to patient can be located elsewhere in the medical record under "Patient Instructions". Document created using STT-dictation technology, any transcriptional errors that may result from process are unintentional.    Patient: Connie [name]  Service Category: E/M  Provider: [name], MD  DOB: 1955/11/10  DOS: 08/07/2020  Specialty: Interventional Pain Management  MRN: [id number]  Setting: Ambulatory outpatient  PCP: Tracie Harrier, MD  Type: Established Patient    Referring Provider: Tracie Harrier, MD  Location: Office  Delivery: Face-to-face     HPI  [name], a 65 y.o. year old female, is here today because of her Lumbar degenerative disc disease [M51.36]. [name] primary complain today is Back Pain (low) Last encounter: My last encounter with her was on 06/12/2020. Pertinent problems: [name] has Degenerative joint disease of cervical and lumbar spine; Lumbar spondylosis; History of lumbar fusion; Lumbar degenerative disc disease; Fibromyalgia; Chronic pain syndrome; Spinal cord stimulator status; Acquired spondylolisthesis; Chronic pain; Generalized osteoarthritis of multiple sites; Spinal stenosis of lumbar region without neurogenic claudication; and Lumbar post-laminectomy syndrome on their pertinent problem list. Pain Assessment: Severity of Chronic pain is reported as a 8 /10. Location: Back Lower/radiates down left to the foot. Onset: More than a month ago. Quality: Burning,Stabbing. Timing: Intermittent. Modifying factor(s): medications. Vitals:  height is 5' (1.524 m) and weight is 230 lb (104.3 kg). Her temperature is 97 F (36.1 C) (abnormal). Her blood pressure is 138/62 and her pulse is 64. Her respiration is 18 and oxygen saturation is  95%.   Reason for encounter: medication management.    Patient continues multimodal pain regimen as prescribed.  States that it provides pain relief and improvement in functional status. Patient is endorsing increased dizziness and imbalance.  She is established with neurology.  I recommend that she touch base with neurology and discussed dose reduction of Lyrica versus further work-up including carotid Dopplers. We have already reduced her levorphanol which she has been on long term from quantity 105 to quantity 90 which has not made any difference in her imbalance.  Recommend she discuss this further with neurology.  Pharmacotherapy Assessment   Analgesic: Levorphanol 2 mg TID prn, #90/month   Monitoring: [place] PMP: PDMP reviewed during this encounter.       Pharmacotherapy: No side-effects or adverse reactions reported. Compliance: No problems identified. Effectiveness: Clinically acceptable.  Dewayne Shorter, RN  08/07/2020  3:19 PM  Signed Nursing Pain Medication Assessment:  Safety precautions to be maintained throughout the outpatient stay will include: orient to surroundings, keep bed in low position, maintain call bell within reach at all times, provide assistance with transfer out of bed and ambulation.  Medication Inspection Compliance: Pill count conducted under aseptic conditions, in front of the patient. Neither the pills nor the bottle was removed from the patient's sight at any time. Once count was completed pills were immediately returned to the patient in their original bottle.  Medication: levorphanol Pill/Patch Count: 17 of 84 pills remain Pill/Patch Appearance: Markings consistent with prescribed medication Bottle Appearance: Standard pharmacy container. Clearly labeled. Filled Date: 03 / 03 / 2022 Last Medication intake:  Today    UDS:  Summary  Date Value Ref Range Status  03/13/2020 Note  Final    Comment:     ==================================================================== Tox[employer] Select 13 (MW) ==================================================================== Test  Result       Flag       Units    NO DRUGS DETECTED. ==================================================================== Test                      Result    Flag   Units      Ref Range   Creatinine              190              mg/dL      >=20 ==================================================================== Declared Medications:  The flagging and interpretation on this report are based on the  following declared medications.  Unexpected results may arise from  inaccuracies in the declared medications.   **Note: The testing scope of this panel does not include the  following reported medications:   Azelastine (Astelin)  Bupropion (Wellbutrin XL)  Celecoxib (Celebrex)  Citalopram (Celexa)  Cyanocobalamin  Furosemide (Lasix)  Hydroxyzine (Vistaril)  Ibuprofen (Advil)  Levorphanol  Levothyroxine (Synthroid)  Loratadine (Claritin)  Methylphenidate (Ritalin)  Metoprolol  Ondansetron  Pantoprazole (Protonix)  Prednisone (Deltasone)  Pregabalin (Lyrica)  Trazodone (Desyrel) ==================================================================== For clinical consultation, please call [phone]. ====================================================================      ROS  Constitutional: Denies any fever or chills Gastrointestinal: No reported hemesis, hematochezia, vomiting, or acute GI distress Musculoskeletal: +LBP Neurological: No reported episodes of acute onset apraxia, aphasia, dysarthria, agnosia, amnesia, paralysis, loss of coordination, or loss of consciousness  Medication Review  azelastine, buPROPion, celecoxib, citalopram, furosemide, hydrOXYzine, ibuprofen, levorphanol, levothyroxine, loratadine, methylphenidate, metoprolol succinate, ondansetron, pantoprazole,  pregabalin, traZODone, and vitamin B-12  History Review  Allergy: [name] has No Known Allergies. Drug: [name]  reports no history of drug use. Alcohol:  reports no history of alcohol use. Tobacco:  reports that she quit smoking about 13 years ago. Her smoking use included cigarettes. She has a 10.00 pack-year smoking history. She has never used smokeless tobacco. Social: Ms. Clodfelter  reports that she quit smoking about 13 years ago. Her smoking use included cigarettes. She has a 10.00 pack-year smoking history. She has never used smokeless tobacco. She reports that she does not drink alcohol and does not use drugs. Medical:  has a past medical history of Anemia, Anxiety, Arthritis, Cervical spondylosis with myelopathy (2018), Chronic pain (2019), DDD (degenerative disc disease), lumbar, Depression, Displacement of cervical intervertebral disc (2018), Dyspnea, Fibromyalgia, GERD (gastroesophageal reflux disease), Heart murmur, Hypertension, Hypothyroidism, Lumbar post-laminectomy syndrome (2018), Lumbosacral radiculitis (2018), Neuropathy due to medical condition ([place]), Osteoarthritis, Osteoporosis, Other long term (current) drug therapy (2019), Pain in the coccyx (2018), Spondylolisthesis (2018), Spondylosis of lumbosacral region without myelopathy or radiculopathy (2018), Spondylosis with myelopathy, lumbar region (2018), and Vitamin D deficiency. Surgical: Ms. Bowley  has a past surgical history that includes Cesarean section; Gastric bypass (2004); Lumbar fusion (2016); Spinal cord stimulator implant (2018); Cholecystectomy; Appendectomy; Tonsillectomy; Shoulder arthroscopy with rotator cuff repair (Right, 2008); Joint replacement (Bilateral, 2005); Abdominal hysterectomy (2000); epidural injection (2018); arm surgery (2002); Breast biopsy (Left, 2005); Colonoscopy with propofol (N/A, 08/25/2017); Temporal artery biopsy / ligation; and Cataract extraction w/PHACO (Right, 10/21/2017). Family: family  history includes Anxiety disorder in her mother; Breast cancer in her cousin, maternal aunt, and mother; Cancer in her brother, father, and mother; Depression in her mother; Hypertension in her father; Kidney disease in her mother.  Laboratory Chemistry Profile   Renal Lab Results  Component Value Date   BUN 18 06/23/2017   CREATININE 0.60 06/23/2017   GFRAA >60  06/23/2017   GFRNONAA >60 06/23/2017     Hepatic Lab Results  Component Value Date   AST 20 06/23/2017   ALT 9 (L) 06/23/2017   ALBUMIN 3.6 06/23/2017   ALKPHOS 88 06/23/2017     Electrolytes Lab Results  Component Value Date   NA 135 06/23/2017   K 4.9 06/23/2017   CL 102 06/23/2017   CALCIUM 8.5 (L) 06/23/2017     Bone No results found for: VD25OH, VD125OH2TOT, YK9983JA2, NK5397QB3, 25OHVITD1, 25OHVITD2, 25OHVITD3, TESTOFREE, TESTOSTERONE   Inflammation (CRP: Acute Phase) (ESR: Chronic Phase) No results found for: CRP, ESRSEDRATE, LATICACIDVEN     Note: Above Lab results reviewed.  Recent Imaging Review  MM 3D SCREEN BREAST BILATERAL CLINICAL DATA:  Screening.  EXAM: DIGITAL SCREENING BILATERAL MAMMOGRAM WITH TOMOSYNTHESIS AND CAD  TECHNIQUE: Bilateral screening digital craniocaudal and mediolateral oblique mammograms were obtained. Bilateral screening digital breast tomosynthesis was performed. The images were evaluated with computer-aided detection.  COMPARISON:  Previous exam(s).  ACR Breast Density Category b: There are scattered areas of fibroglandular density.  FINDINGS: There are no findings suspicious for malignancy.  IMPRESSION: No mammographic evidence of malignancy. A result letter of this screening mammogram will be mailed directly to the patient.  RECOMMENDATION: Screening mammogram in one year. (Code:SM-B-01Y)  BI-RADS CATEGORY  1: Negative.  Electronically Signed   By: Fidela Salisbury M.D.   On: 06/21/2020 16:51 Note: Reviewed        Physical Exam  General  appearance: Well nourished, well developed, and well hydrated. In no apparent acute distress Mental status: Alert, oriented x 3 (person, place, & time)       Respiratory: No evidence of acute respiratory distress Eyes: PERLA Vitals: BP 138/62   Pulse 64   Temp (!) 97 F (36.1 C)   Resp 18   Ht 5' (1.524 m)   Wt 230 lb (104.3 kg)   SpO2 95%   BMI 44.92 kg/m  BMI: Estimated body mass index is 44.92 kg/m as calculated from the following:   Height as of this encounter: 5' (1.524 m).   Weight as of this encounter: 230 lb (104.3 kg). Ideal: Ideal body weight: 45.5 kg (100 lb 4.9 oz) Adjusted ideal body weight: 69 kg (152 lb 3 oz)  Lumbar Spine Area Exam  Skin & Axial Inspection:Well healed scar from previous spine surgery detectedspinal cord stimulator in place with IPG present Alignment:Symmetrical Functional ALP:[id number] ROMaffecting both sides Stability:No instability detected Muscle Tone/Strength:Functionally intact. No obvious neuro-muscular anomalies detected. Sensory (Neurological):Dermatomal pain pattern Palpation:No palpable anomalies  Gait & Posture Assessment  Ambulation:Limited Gait:Antalgic gait (limping) Posture:Difficulty standing up straight, due to pain  Lower Extremity Exam    Side:Right lower extremity  Side:Left lower extremity  Stability:No instability observed  Stability:No instability observed  Skin & Extremity Inspection:Skin color, temperature, and hair growth are WNL. No peripheral edema or cyanosis. No masses, redness, swelling, asymmetry, or associated skin lesions. No contractures.  Skin & Extremity Inspection:Skin color, temperature, and hair growth are WNL. No peripheral edema or cyanosis. No masses, redness, swelling, asymmetry, or associated skin lesions. No contractures.  Functional DZH:[id number] ROMfor hip and knee joints   Functional MHD:[id number] ROMfor hip and knee joints    Muscle Tone/Strength:Functionally intact. No obvious neuro-muscular anomalies detected.  Muscle Tone/Strength:Functionally intact. No obvious neuro-muscular anomalies detected.  Sensory (Neurological):Dermatomal pain pattern  Sensory (Neurological):Dermatomal pain pattern  DTR: Patellar:deferred today Achilles:deferred today Plantar:deferred today  DTR: Patellar:deferred today Achilles:deferred today Plantar:deferred today  Palpation:No palpable anomalies  Palpation:No  palpable anomalies   Assessment   Status Diagnosis  Controlled Controlled Controlled 1. Lumbar degenerative disc disease   2. Lumbar radiculopathy   3. Spinal stenosis of lumbosacral region   4. Spondylosis of cervical region without myelopathy or radiculopathy   5. Chronic pain syndrome       Plan of Care  Ms. Desare Duddy Danis has a current medication list which includes the following long-term medication(s): azelastine, azelastine, bupropion, bupropion, citalopram, levothyroxine, loratadine, methylphenidate, metoprolol succinate, pantoprazole, pregabalin, and trazodone.  Pharmacotherapy (Medications Ordered): Meds ordered this encounter  Medications  . levorphanol (LEVODROMORAN) 2 MG tablet    Sig: Take 1 tablet (2 mg total) by mouth every 8 (eight) hours as needed for pain.    Dispense:  90 tablet    Refill:  0    For chronic pain syndrome  . levorphanol (LEVODROMORAN) 2 MG tablet    Sig: Take 1 tablet (2 mg total) by mouth every 8 (eight) hours as needed for pain.    Dispense:  90 tablet    Refill:  0    For chronic pain syndrome  . celecoxib (CELEBREX) 100 MG capsule    Sig: Take 1 capsule (100 mg total) by mouth 2 (two) times daily as needed.    Dispense:  60 capsule    Refill:  2   Orders:  Orders Placed This Encounter  Procedures  . ToxASSURE Select 13 (MW), Urine    Volume: 30 ml(s). Minimum 3 ml of urine is needed. Document temperature of fresh  sample. Indications: Long term (current) use of opiate analgesic [phone])    Order Specific Question:   Release to patient    Answer:   Immediate   Follow-up plan:   Return in about 8 weeks (around 10/02/2020) for Medication Management, in person.   Recent Visits Date Type Provider Dept  06/12/20 Office Visit [name], MD [facility]  Showing recent visits within past 90 days and meeting all other requirements Today's Visits Date Type Provider Dept  08/07/20 Office Visit [name], MD [facility]  Showing today's visits and meeting all other requirements Future Appointments No visits were found meeting these conditions. Showing future appointments within next 90 days and meeting all other requirements  I discussed the assessment and treatment plan with the patient. The patient was provided an opportunity to ask questions and all were answered. The patient agreed with the plan and demonstrated an understanding of the instructions.  Patient advised to call back or seek an in-person evaluation if the symptoms or condition worsens.  Duration of encounter: 30 minutes.  Note by: [name], MD Date: 08/07/2020; Time: 3:29 PM

## 2020-08-07 NOTE — Progress Notes (Signed)
Nursing Pain Medication Assessment:  Safety precautions to be maintained throughout the outpatient stay will include: orient to surroundings, keep bed in low position, maintain call bell within reach at all times, provide assistance with transfer out of bed and ambulation.  Medication Inspection Compliance: Pill count conducted under aseptic conditions, in front of the patient. Neither the pills nor the bottle was removed from the patient's sight at any time. Once count was completed pills were immediately returned to the patient in their original bottle.  Medication: levorphanol Pill/Patch Count: 17 of 84 pills remain Pill/Patch Appearance: Markings consistent with prescribed medication Bottle Appearance: Standard pharmacy container. Clearly labeled. Filled Date: 03 / 03 / 2022 Last Medication intake:  Today

## 2020-08-11 LAB — TOXASSURE SELECT 13 (MW), URINE

## 2020-08-13 ENCOUNTER — Other Ambulatory Visit: Payer: Self-pay | Admitting: Psychiatry

## 2020-08-13 DIAGNOSIS — F3342 Major depressive disorder, recurrent, in full remission: Secondary | ICD-10-CM

## 2020-09-03 ENCOUNTER — Other Ambulatory Visit: Payer: Self-pay | Admitting: Psychiatry

## 2020-09-03 DIAGNOSIS — F3341 Major depressive disorder, recurrent, in partial remission: Secondary | ICD-10-CM

## 2020-09-14 ENCOUNTER — Other Ambulatory Visit: Payer: Self-pay | Admitting: Psychiatry

## 2020-09-14 DIAGNOSIS — F3341 Major depressive disorder, recurrent, in partial remission: Secondary | ICD-10-CM

## 2020-09-30 ENCOUNTER — Other Ambulatory Visit: Payer: Self-pay | Admitting: Psychiatry

## 2020-09-30 DIAGNOSIS — F3342 Major depressive disorder, recurrent, in full remission: Secondary | ICD-10-CM

## 2020-10-01 ENCOUNTER — Ambulatory Visit
Payer: Managed Care, Other (non HMO) | Attending: Student in an Organized Health Care Education/Training Program | Admitting: Student in an Organized Health Care Education/Training Program

## 2020-10-01 ENCOUNTER — Encounter: Payer: Self-pay | Admitting: Student in an Organized Health Care Education/Training Program

## 2020-10-01 ENCOUNTER — Other Ambulatory Visit: Payer: Self-pay

## 2020-10-01 VITALS — BP 155/87 | HR 71 | Temp 96.8°F | Resp 16 | Ht 60.0 in | Wt 255.0 lb

## 2020-10-01 DIAGNOSIS — Z9689 Presence of other specified functional implants: Secondary | ICD-10-CM | POA: Diagnosis not present

## 2020-10-01 DIAGNOSIS — M47812 Spondylosis without myelopathy or radiculopathy, cervical region: Secondary | ICD-10-CM | POA: Insufficient documentation

## 2020-10-01 DIAGNOSIS — F1120 Opioid dependence, uncomplicated: Secondary | ICD-10-CM | POA: Diagnosis present

## 2020-10-01 DIAGNOSIS — M5416 Radiculopathy, lumbar region: Secondary | ICD-10-CM | POA: Insufficient documentation

## 2020-10-01 DIAGNOSIS — G894 Chronic pain syndrome: Secondary | ICD-10-CM | POA: Diagnosis present

## 2020-10-01 DIAGNOSIS — M5136 Other intervertebral disc degeneration, lumbar region: Secondary | ICD-10-CM | POA: Insufficient documentation

## 2020-10-01 DIAGNOSIS — M4807 Spinal stenosis, lumbosacral region: Secondary | ICD-10-CM | POA: Diagnosis not present

## 2020-10-01 DIAGNOSIS — Z981 Arthrodesis status: Secondary | ICD-10-CM | POA: Insufficient documentation

## 2020-10-01 MED ORDER — LEVORPHANOL TARTRATE 2 MG PO TABS: 2.0000 mg | ORAL_TABLET | Freq: Two times a day (BID) | ORAL | 0 refills | Status: AC | PRN

## 2020-10-01 NOTE — Progress Notes (Signed)
PROVIDER NOTE: Information contained herein reflects review and annotations entered in association with encounter. Interpretation of such information and data should be left to medically-trained personnel. Information provided to patient can be located elsewhere in the medical record under "Patient Instructions". Document created using STT-dictation technology, any transcriptional errors that may result from process are unintentional.    Patient: Connie [name]  Service Category: E/M  Provider: [name], MD  DOB: [birth year]  DOS: 10/01/2020  Specialty: Interventional Pain Management  MRN: [id number]  Setting: Ambulatory outpatient  PCP: Tracie Harrier, MD  Type: Established Patient    Referring Provider: Tracie Harrier, MD  Location: Office  Delivery: Face-to-face     HPI  [name], a 65 y.o. year old female, is here today because of her Lumbar degenerative disc disease [M51.36]. [name] primary complain today is Back Pain (Lumbar bilateral), Neck Pain (Right ), and Shoulder Pain (bilateral) Last encounter: My last encounter with her was on 06/12/2020. Pertinent problems: Connie [name] has Degenerative joint disease of cervical and lumbar spine; Lumbar spondylosis; History of lumbar fusion; Lumbar degenerative disc disease; Fibromyalgia; Chronic pain syndrome; Spinal cord stimulator status; Acquired spondylolisthesis; Chronic pain; Generalized osteoarthritis of multiple sites; Spinal stenosis of lumbar region without neurogenic claudication; and Lumbar post-laminectomy syndrome on their pertinent problem list. Pain Assessment: Severity of Chronic pain is reported as a 4 /10. Location: Back (see visit info for additional pain sites.) Lower,Left,Right/? neck into shoulders.  back pain down both legs to the feet. Onset: More than a month ago. Quality: Discomfort,Constant,Tingling,Numbness. Timing: Constant. Modifying factor(s): getting off her feet, medications. Vitals:  height is 5'  (1.524 m) and weight is 255 lb (115.7 kg). Her temporal temperature is 96.8 F (36 C) (abnormal). Her blood pressure is 155/87 (abnormal) and her pulse is 71. Her respiration is 16 and oxygen saturation is 94%.   Reason for encounter: medication management.    Patient continues multimodal pain regimen as prescribed.  States that it provides pain relief and improvement in functional status. No significant changes in her medical history.  Patient has reduced her levorphanol dose that she is only taking 2 to 4 mg in the morning which she states last pretty much all day. We will reduce her monthly quantity to 60 encouraged her to reduce her dose even further if she can. She states that her husband will be retiring and that their insurance will no longer be what it is in July so she may need to find an alternative.   Pharmacotherapy Assessment   Analgesic: Levorphanol 2 mg BID  prn, #60/month   Monitoring: [place] PMP: PDMP reviewed during this encounter.       Pharmacotherapy: No side-effects or adverse reactions reported. Compliance: No problems identified. Effectiveness: Clinically acceptable.  Connie Billow, RN  10/01/2020  2:27 PM  Sign when Signing Visit Nursing Pain Medication Assessment:  Safety precautions to be maintained throughout the outpatient stay will include: orient to surroundings, keep bed in low position, maintain call bell within reach at all times, provide assistance with transfer out of bed and ambulation.  Medication Inspection Compliance: Connie [name] did not comply with our request to bring her pills to be counted. She was reminded that bringing the medication bottles, even when empty, is a requirement.  Medication: None brought in. Pill/Patch Count: None available to be counted. Bottle Appearance: No container available. Did not bring bottle(s) to appointment. Filled Date: N/A Last Medication intake:  Today    UDS:  Summary  Date Value Ref Range Status   08/07/2020 Note  Final    Comment:    ==================================================================== [employer] Select 13 (MW) ==================================================================== Test                             Result       Flag       Units    NO DRUGS DETECTED. ==================================================================== Test                      Result    Flag   Units      Ref Range   Creatinine              131              mg/dL      >=20 ==================================================================== Declared Medications:  The flagging and interpretation on this report are based on the  following declared medications.  Unexpected results may arise from  inaccuracies in the declared medications.   **Note: The testing scope of this panel does not include the  following reported medications:   Azelastine (Astelin)  Bupropion (Wellbutrin)  Celecoxib (Celebrex)  Citalopram (Celexa)  Cyanocobalamin  Furosemide (Lasix)  Hydroxyzine (Vistaril)  Ibuprofen (Advil)  Levorphanol  Levothyroxine (Synthroid)  Loratadine (Claritin)  Methylphenidate (Ritalin)  Metoprolol (Toprol)  Ondansetron (Zofran)  Pantoprazole (Protonix)  Pregabalin (Lyrica)  Trazodone (Desyrel) ==================================================================== For clinical consultation, please call [phone]. ====================================================================      ROS  Constitutional: Denies any fever or chills Gastrointestinal: No reported hemesis, hematochezia, vomiting, or acute GI distress Musculoskeletal: +LBP Neurological: No reported episodes of acute onset apraxia, aphasia, dysarthria, agnosia, amnesia, paralysis, loss of coordination, or loss of consciousness  Medication Review  azelastine, buPROPion, celecoxib, citalopram, furosemide, hydrOXYzine, ibuprofen, levorphanol, levothyroxine, loratadine, methylphenidate, metoprolol  succinate, ondansetron, pantoprazole, pregabalin, traZODone, and vitamin B-12  History Review  Allergy: Connie [name] has No Known Allergies. Drug: Connie [name]  reports no history of drug use. Alcohol:  reports no history of alcohol use. Tobacco:  reports that she quit smoking about 13 years ago. Her smoking use included cigarettes. She has a 10.00 pack-year smoking history. She has never used smokeless tobacco. Social: Connie [name]  reports that she quit smoking about 13 years ago. Her smoking use included cigarettes. She has a 10.00 pack-year smoking history. She has never used smokeless tobacco. She reports that she does not drink alcohol and does not use drugs. Medical:  has a past medical history of Anemia, Anxiety, Arthritis, Cervical spondylosis with myelopathy (2018), Chronic pain (2019), DDD (degenerative disc disease), lumbar, Depression, Displacement of cervical intervertebral disc (2018), Dyspnea, Fibromyalgia, GERD (gastroesophageal reflux disease), Heart murmur, Hypertension, Hypothyroidism, Lumbar post-laminectomy syndrome (2018), Lumbosacral radiculitis (2018), Neuropathy due to medical condition ([place]), Osteoarthritis, Osteoporosis, Other long term (current) drug therapy (2019), Pain in the coccyx (2018), Spondylolisthesis (2018), Spondylosis of lumbosacral region without myelopathy or radiculopathy (2018), Spondylosis with myelopathy, lumbar region (2018), and Vitamin D deficiency. Surgical: Connie [name]  has a past surgical history that includes Cesarean section; Gastric bypass (2004); Lumbar fusion (2016); Spinal cord stimulator implant (2018); Cholecystectomy; Appendectomy; Tonsillectomy; Shoulder arthroscopy with rotator cuff repair (Right, 2008); Joint replacement (Bilateral, 2005); Abdominal hysterectomy (2000); epidural injection (2018); arm surgery (2002); Breast biopsy (Left, 2005); Colonoscopy with propofol (N/A, 08/25/2017); Temporal artery biopsy / ligation; and Cataract extraction  w/PHACO (Right, 10/21/2017). Family: family history includes Anxiety disorder in her mother; Breast cancer in  her cousin, maternal aunt, and mother; Cancer in her brother, father, and mother; Depression in her mother; Hypertension in her father; Kidney disease in her mother.  Laboratory Chemistry Profile   Renal Lab Results  Component Value Date   BUN 18 06/23/2017   CREATININE 0.60 06/23/2017   GFRAA >60 06/23/2017   GFRNONAA >60 06/23/2017     Hepatic Lab Results  Component Value Date   AST 20 06/23/2017   ALT 9 (L) 06/23/2017   ALBUMIN 3.6 06/23/2017   ALKPHOS 88 06/23/2017     Electrolytes Lab Results  Component Value Date   NA 135 06/23/2017   K 4.9 06/23/2017   CL 102 06/23/2017   CALCIUM 8.5 (L) 06/23/2017     Bone No results found for: VD25OH, VD125OH2TOT, WU9811BJ4, NW2956OZ3, 25OHVITD1, 25OHVITD2, 25OHVITD3, TESTOFREE, TESTOSTERONE   Inflammation (CRP: Acute Phase) (ESR: Chronic Phase) No results found for: CRP, ESRSEDRATE, LATICACIDVEN     Note: Above Lab results reviewed.  Recent Imaging Review  MM 3D SCREEN BREAST BILATERAL CLINICAL DATA:  Screening.  EXAM: DIGITAL SCREENING BILATERAL MAMMOGRAM WITH TOMOSYNTHESIS AND CAD  TECHNIQUE: Bilateral screening digital craniocaudal and mediolateral oblique mammograms were obtained. Bilateral screening digital breast tomosynthesis was performed. The images were evaluated with computer-aided detection.  COMPARISON:  Previous exam(s).  ACR Breast Density Category b: There are scattered areas of fibroglandular density.  FINDINGS: There are no findings suspicious for malignancy.  IMPRESSION: No mammographic evidence of malignancy. A result letter of this screening mammogram will be mailed directly to the patient.  RECOMMENDATION: Screening mammogram in one year. (Code:SM-B-01Y)  BI-RADS CATEGORY  1: Negative.  Electronically Signed   By: Fidela Salisbury M.D.   On: 06/21/2020 16:51 Note:  Reviewed        Physical Exam  General appearance: Well nourished, well developed, and well hydrated. In no apparent acute distress Mental status: Alert, oriented x 3 (person, place, & time)       Respiratory: No evidence of acute respiratory distress Eyes: PERLA Vitals: BP (!) 155/87 (BP Location: Right Arm, Patient Position: Sitting, Cuff Size: Large)   Pulse 71   Temp (!) 96.8 F (36 C) (Temporal)   Resp 16   Ht 5' (1.524 m)   Wt 255 lb (115.7 kg)   SpO2 94%   BMI 49.80 kg/m  BMI: Estimated body mass index is 49.8 kg/m as calculated from the following:   Height as of this encounter: 5' (1.524 m).   Weight as of this encounter: 255 lb (115.7 kg). Ideal: Ideal body weight: 45.5 kg (100 lb 4.9 oz) Adjusted ideal body weight: 73.6 kg (162 lb 3 oz)  Lumbar Spine Area Exam  Skin & Axial Inspection:Well healed scar from previous spine surgery detectedspinal cord stimulator in place with IPG present Alignment:Symmetrical Functional YQM:VHQIONGEX ROMaffecting both sides Stability:No instability detected Muscle Tone/Strength:Functionally intact. No obvious neuro-muscular anomalies detected. Sensory (Neurological):Dermatomal pain pattern Palpation:No palpable anomalies  Gait & Posture Assessment  Ambulation:Limited Gait:Antalgic gait (limping) Posture:Difficulty standing up straight, due to pain  Lower Extremity Exam    Side:Right lower extremity  Side:Left lower extremity  Stability:No instability observed  Stability:No instability observed  Skin & Extremity Inspection:Skin color, temperature, and hair growth are WNL. No peripheral edema or cyanosis. No masses, redness, swelling, asymmetry, or associated skin lesions. No contractures.  Skin & Extremity Inspection:Skin color, temperature, and hair growth are WNL. No peripheral edema or cyanosis. No masses, redness, swelling, asymmetry, or associated skin lesions. No contractures.   Functional BMW:[id number]  ROMfor hip and knee joints   Functional IRS:[id number] ROMfor hip and knee joints   Muscle Tone/Strength:Functionally intact. No obvious neuro-muscular anomalies detected.  Muscle Tone/Strength:Functionally intact. No obvious neuro-muscular anomalies detected.  Sensory (Neurological):Dermatomal pain pattern  Sensory (Neurological):Dermatomal pain pattern  DTR: Patellar:deferred today Achilles:deferred today Plantar:deferred today  DTR: Patellar:deferred today Achilles:deferred today Plantar:deferred today  Palpation:No palpable anomalies  Palpation:No palpable anomalies   Assessment   Status Diagnosis  Controlled Controlled Controlled 1. Lumbar degenerative disc disease   2. Lumbar radiculopathy   3. Spinal stenosis of lumbosacral region   4. Spinal cord stimulator status   5. Spondylosis of cervical region without myelopathy or radiculopathy   6. History of lumbar fusion   7. Opioid type dependence, continuous ([place])   8. Chronic pain syndrome       Plan of Care  Connie [name] has a current medication list which includes the following long-term medication(s): azelastine, azelastine, bupropion, bupropion, citalopram, levothyroxine, loratadine, methylphenidate, metoprolol succinate, pantoprazole, pregabalin, and trazodone.  Pharmacotherapy (Medications Ordered): Meds ordered this encounter  Medications  . levorphanol (LEVODROMORAN) 2 MG tablet    Sig: Take 1 tablet (2 mg total) by mouth every 12 (twelve) hours as needed for pain.    Dispense:  60 tablet    Refill:  0    For chronic pain syndrome   Continue psychiatric management.  Continue Celebrex 100 mg twice daily as needed, Lyrica 100 mg 3 times a day.  Follow-up plan:   Return in about 11 weeks (around 12/17/2020) for Medication Management, in person.   Recent Visits Date Type Provider Dept  08/07/20 Office Visit [name], MD  [facility]  Showing recent visits within past 90 days and meeting all other requirements Today's Visits Date Type Provider Dept  10/01/20 Office Visit [name], MD [facility]  Showing today's visits and meeting all other requirements Future Appointments No visits were found meeting these conditions. Showing future appointments within next 90 days and meeting all other requirements  I discussed the assessment and treatment plan with the patient. The patient was provided an opportunity to ask questions and all were answered. The patient agreed with the plan and demonstrated an understanding of the instructions.  Patient advised to call back or seek an in-person evaluation if the symptoms or condition worsens.  Duration of encounter: 30 minutes.  Note by: [name], MD Date: 10/01/2020; Time: 2:49 PM

## 2020-10-01 NOTE — Progress Notes (Signed)
Nursing Pain Medication Assessment:  Safety precautions to be maintained throughout the outpatient stay will include: orient to surroundings, keep bed in low position, maintain call bell within reach at all times, provide assistance with transfer out of bed and ambulation.  Medication Inspection Compliance: Connie [name] did not comply with our request to bring her pills to be counted. She was reminded that bringing the medication bottles, even when empty, is a requirement.  Medication: None brought in. Pill/Patch Count: None available to be counted. Bottle Appearance: No container available. Did not bring bottle(s) to appointment. Filled Date: N/A Last Medication intake:  Today

## 2020-10-13 ENCOUNTER — Other Ambulatory Visit: Payer: Self-pay | Admitting: Psychiatry

## 2020-10-13 DIAGNOSIS — F3341 Major depressive disorder, recurrent, in partial remission: Secondary | ICD-10-CM

## 2020-11-02 ENCOUNTER — Other Ambulatory Visit: Payer: Self-pay | Admitting: Psychiatry

## 2020-11-02 DIAGNOSIS — F5105 Insomnia due to other mental disorder: Secondary | ICD-10-CM

## 2020-11-02 NOTE — Telephone Encounter (Signed)
Decline citalopram given she has not seen since last year

## 2020-11-04 ENCOUNTER — Other Ambulatory Visit: Payer: Self-pay | Admitting: Student in an Organized Health Care Education/Training Program

## 2020-12-13 ENCOUNTER — Ambulatory Visit
Payer: Self-pay | Attending: Student in an Organized Health Care Education/Training Program | Admitting: Student in an Organized Health Care Education/Training Program

## 2020-12-13 ENCOUNTER — Other Ambulatory Visit: Payer: Self-pay

## 2020-12-13 ENCOUNTER — Encounter: Payer: Self-pay | Admitting: Student in an Organized Health Care Education/Training Program

## 2020-12-13 VITALS — BP 152/74 | HR 74 | Temp 96.6°F | Resp 18 | Ht 60.0 in | Wt 250.0 lb

## 2020-12-13 DIAGNOSIS — M5136 Other intervertebral disc degeneration, lumbar region: Secondary | ICD-10-CM | POA: Insufficient documentation

## 2020-12-13 DIAGNOSIS — G894 Chronic pain syndrome: Secondary | ICD-10-CM | POA: Insufficient documentation

## 2020-12-13 DIAGNOSIS — M47812 Spondylosis without myelopathy or radiculopathy, cervical region: Secondary | ICD-10-CM | POA: Insufficient documentation

## 2020-12-13 DIAGNOSIS — M5416 Radiculopathy, lumbar region: Secondary | ICD-10-CM | POA: Insufficient documentation

## 2020-12-13 DIAGNOSIS — M4807 Spinal stenosis, lumbosacral region: Secondary | ICD-10-CM | POA: Insufficient documentation

## 2020-12-13 DIAGNOSIS — Z9689 Presence of other specified functional implants: Secondary | ICD-10-CM | POA: Insufficient documentation

## 2020-12-13 MED ORDER — LEVORPHANOL TARTRATE 2 MG PO TABS: 2.0000 mg | ORAL_TABLET | Freq: Two times a day (BID) | ORAL | 0 refills | Status: AC | PRN

## 2020-12-13 MED ORDER — CELECOXIB 100 MG PO CAPS: 100.0000 mg | ORAL_CAPSULE | Freq: Two times a day (BID) | ORAL | 2 refills | Status: AC | PRN

## 2020-12-13 MED ORDER — METHYLPREDNISOLONE 4 MG PO TBPK: ORAL_TABLET | ORAL | 0 refills | Status: AC

## 2020-12-13 MED ORDER — LEVORPHANOL TARTRATE 2 MG PO TABS: 2.0000 mg | ORAL_TABLET | Freq: Two times a day (BID) | ORAL | 0 refills | Status: DC | PRN

## 2020-12-13 NOTE — Progress Notes (Signed)
Nursing Pain Medication Assessment:  Safety precautions to be maintained throughout the outpatient stay will include: orient to surroundings, keep bed in low position, maintain call bell within reach at all times, provide assistance with transfer out of bed and ambulation.  Medication Inspection Compliance: Pill count conducted under aseptic conditions, in front of the patient. Neither the pills nor the bottle was removed from the patient's sight at any time. Once count was completed pills were immediately returned to the patient in their original bottle.  Medication:  Levorphanol Pill/Patch Count:  3 of 60 pills remain Pill/Patch Appearance: Markings consistent with prescribed medication Bottle Appearance: Standard pharmacy container. Clearly labeled. Filled Date: 8 / 8 / 2022 Last Medication intake:  Today

## 2020-12-13 NOTE — Progress Notes (Signed)
PROVIDER NOTE: Information contained herein reflects review and annotations entered in association with encounter. Interpretation of such information and data should be left to medically-trained personnel. Information provided to patient can be located elsewhere in the medical record under "Patient Instructions". Document created using STT-dictation technology, any transcriptional errors that may result from process are unintentional.    Patient: Connie [name]  Service Category: E/M  Provider: [name], MD  DOB: Oct 18, 1955  DOS: 12/13/2020  Specialty: Interventional Pain Management  MRN: [id number]  Setting: Ambulatory outpatient  PCP: Tracie Harrier, MD  Type: Established Patient    Referring Provider: Tracie Harrier, MD  Location: Office  Delivery: Face-to-face     HPI  Connie [name], a 65 y.o. year old female, is here today because of her Lumbar degenerative disc disease [M51.36]. Connie [name] primary complain today is Knee Pain (Bilateral, right worse), Back Pain (lower), and Shoulder Pain (bilateral) Last encounter: My last encounter with her was on 10/01/20 Pertinent problems: Connie [name] has Degenerative joint disease of cervical and lumbar spine; Lumbar spondylosis; History of lumbar fusion; Lumbar degenerative disc disease; Fibromyalgia; Chronic pain syndrome; Spinal cord stimulator status; Acquired spondylolisthesis; Chronic pain; Generalized osteoarthritis of multiple sites; Spinal stenosis of lumbar region without neurogenic claudication; and Lumbar post-laminectomy syndrome on their pertinent problem list. Pain Assessment: Severity of Chronic pain is reported as a 10-Worst pain ever/10. Location: Back Lower/buttocks bilateral down back of legs to knees, right knee is the worse. Onset: More than a month ago. Quality: Stabbing, Aching, Discomfort (Grabbing pain when gettin up from chair). Timing: Constant. Modifying factor(s): "Nothing helps at all", rest. Vitals:  height is  5' (1.524 m) and weight is 250 lb (113.4 kg). Her temperature is 96.6 F (35.9 C) (abnormal). Her blood pressure is 152/74 (abnormal) and her pulse is 74. Her respiration is 18 and oxygen saturation is 96%.   Reason for encounter: medication management.    No change in medical history since last visit.   Patient continues multimodal pain regimen as prescribed.  States that it provides pain relief and improvement in functional status. Is having increased lower back pain.  No inciting or traumatic event.  She is requesting a steroid taper which is helped her out in the past. She takes 4 mg of levorphanol in the morning. She would also like a refill of her Celebrex   Pharmacotherapy Assessment   Analgesic: Levorphanol 2 mg BID  prn, #60/month   Monitoring: [place] PMP: PDMP reviewed during this encounter.       Pharmacotherapy: No side-effects or adverse reactions reported. Compliance: No problems identified. Effectiveness: Clinically acceptable.  Ignatius Specking, RN  12/13/2020  1:08 PM  Sign when Signing Visit Nursing Pain Medication Assessment:  Safety precautions to be maintained throughout the outpatient stay will include: orient to surroundings, keep bed in low position, maintain call bell within reach at all times, provide assistance with transfer out of bed and ambulation.  Medication Inspection Compliance: Pill count conducted under aseptic conditions, in front of the patient. Neither the pills nor the bottle was removed from the patient's sight at any time. Once count was completed pills were immediately returned to the patient in their original bottle.  Medication:  Levorphanol Pill/Patch Count:  3 of 60 pills remain Pill/Patch Appearance: Markings consistent with prescribed medication Bottle Appearance: Standard pharmacy container. Clearly labeled. Filled Date: 8 / 8 / 2022 Last Medication intake:  Today      UDS:  Summary  Date Value  Ref Range Status  08/07/2020 Note  Final     Comment:    ==================================================================== [employer] Select 13 (MW) ==================================================================== Test                             Result       Flag       Units    NO DRUGS DETECTED. ==================================================================== Test                      Result    Flag   Units      Ref Range   Creatinine              131              mg/dL      >=20 ==================================================================== Declared Medications:  The flagging and interpretation on this report are based on the  following declared medications.  Unexpected results may arise from  inaccuracies in the declared medications.   **Note: The testing scope of this panel does not include the  following reported medications:   Azelastine (Astelin)  Bupropion (Wellbutrin)  Celecoxib (Celebrex)  Citalopram (Celexa)  Cyanocobalamin  Furosemide (Lasix)  Hydroxyzine (Vistaril)  Ibuprofen (Advil)  Levorphanol  Levothyroxine (Synthroid)  Loratadine (Claritin)  Methylphenidate (Ritalin)  Metoprolol (Toprol)  Ondansetron (Zofran)  Pantoprazole (Protonix)  Pregabalin (Lyrica)  Trazodone (Desyrel) ==================================================================== For clinical consultation, please call [phone]. ====================================================================      ROS  Constitutional: Denies any fever or chills Gastrointestinal: No reported hemesis, hematochezia, vomiting, or acute GI distress Musculoskeletal:  +LBP Neurological: No reported episodes of acute onset apraxia, aphasia, dysarthria, agnosia, amnesia, paralysis, loss of coordination, or loss of consciousness  Medication Review  azelastine, buPROPion, celecoxib, hydrOXYzine, levorphanol, levothyroxine, loratadine, methylPREDNISolone, metoprolol succinate, pantoprazole, pregabalin, traZODone, and vitamin  B-12  History Review  Allergy: Connie [name] has No Known Allergies. Drug: Connie [name]  reports no history of drug use. Alcohol:  reports no history of alcohol use. Tobacco:  reports that she quit smoking about 13 years ago. Her smoking use included cigarettes. She has a 10.00 pack-year smoking history. She has never used smokeless tobacco. Social: Connie [name]  reports that she quit smoking about 13 years ago. Her smoking use included cigarettes. She has a 10.00 pack-year smoking history. She has never used smokeless tobacco. She reports that she does not drink alcohol and does not use drugs. Medical:  has a past medical history of Anemia, Anxiety, Arthritis, Cervical spondylosis with myelopathy (2018), Chronic pain (2019), DDD (degenerative disc disease), lumbar, Depression, Displacement of cervical intervertebral disc (2018), Dyspnea, Fibromyalgia, GERD (gastroesophageal reflux disease), Heart murmur, Hypertension, Hypothyroidism, Lumbar post-laminectomy syndrome (2018), Lumbosacral radiculitis (2018), Neuropathy due to medical condition ([place]), Osteoarthritis, Osteoporosis, Other long term (current) drug therapy (2019), Pain in the coccyx (2018), Spondylolisthesis (2018), Spondylosis of lumbosacral region without myelopathy or radiculopathy (2018), Spondylosis with myelopathy, lumbar region (2018), and Vitamin D deficiency. Surgical: Connie [name]  has a past surgical history that includes Cesarean section; Gastric bypass (2004); Lumbar fusion (2016); Spinal cord stimulator implant (2018); Cholecystectomy; Appendectomy; Tonsillectomy; Shoulder arthroscopy with rotator cuff repair (Right, 2008); Joint replacement (Bilateral, 2005); Abdominal hysterectomy (2000); epidural injection (2018); arm surgery (2002); Breast biopsy (Left, 2005); Colonoscopy with propofol (N/A, 08/25/2017); Temporal artery biopsy / ligation; and Cataract extraction w/PHACO (Right, 10/21/2017). Family: family history includes Anxiety disorder  in her mother; Breast cancer in her cousin, maternal aunt, and mother; Cancer  in her brother, father, and mother; Depression in her mother; Hypertension in her father; Kidney disease in her mother.  Laboratory Chemistry Profile   Renal Lab Results  Component Value Date   BUN 18 06/23/2017   CREATININE 0.60 06/23/2017   GFRAA >60 06/23/2017   GFRNONAA >60 06/23/2017     Hepatic Lab Results  Component Value Date   AST 20 06/23/2017   ALT 9 (L) 06/23/2017   ALBUMIN 3.6 06/23/2017   ALKPHOS 88 06/23/2017     Electrolytes Lab Results  Component Value Date   NA 135 06/23/2017   K 4.9 06/23/2017   CL 102 06/23/2017   CALCIUM 8.5 (L) 06/23/2017     Bone No results found for: VD25OH, VD125OH2TOT, WF0932TF5, DD2202RK2, 25OHVITD1, 25OHVITD2, 25OHVITD3, TESTOFREE, TESTOSTERONE   Inflammation (CRP: Acute Phase) (ESR: Chronic Phase) No results found for: CRP, ESRSEDRATE, LATICACIDVEN     Note: Above Lab results reviewed.  Recent Imaging Review  MM 3D SCREEN BREAST BILATERAL CLINICAL DATA:  Screening.  EXAM: DIGITAL SCREENING BILATERAL MAMMOGRAM WITH TOMOSYNTHESIS AND CAD  TECHNIQUE: Bilateral screening digital craniocaudal and mediolateral oblique mammograms were obtained. Bilateral screening digital breast tomosynthesis was performed. The images were evaluated with computer-aided detection.  COMPARISON:  Previous exam(s).  ACR Breast Density Category b: There are scattered areas of fibroglandular density.  FINDINGS: There are no findings suspicious for malignancy.  IMPRESSION: No mammographic evidence of malignancy. A result letter of this screening mammogram will be mailed directly to the patient.  RECOMMENDATION: Screening mammogram in one year. (Code:SM-B-01Y)  BI-RADS CATEGORY  1: Negative.  Electronically Signed   By: Fidela Salisbury M.D.   On: 06/21/2020 16:51 Note: Reviewed        Physical Exam  General appearance: Well nourished, well developed,  and well hydrated. In no apparent acute distress Mental status: Alert, oriented x 3 (person, place, & time)       Respiratory: No evidence of acute respiratory distress Eyes: PERLA Vitals: BP (!) 152/74   Pulse 74   Temp (!) 96.6 F (35.9 C)   Resp 18   Ht 5' (1.524 m)   Wt 250 lb (113.4 kg)   SpO2 96%   BMI 48.82 kg/m  BMI: Estimated body mass index is 48.82 kg/m as calculated from the following:   Height as of this encounter: 5' (1.524 m).   Weight as of this encounter: 250 lb (113.4 kg). Ideal: Ideal body weight: 45.5 kg (100 lb 4.9 oz) Adjusted ideal body weight: 72.7 kg (160 lb 3 oz)  Lumbar Spine Area Exam  Skin & Axial Inspection: Well healed scar from previous spine surgery detected spinal cord stimulator in place with IPG present Alignment: Symmetrical Functional ROM: Decreased ROM affecting both sides Stability: No instability detected Muscle Tone/Strength: Functionally intact. No obvious neuro-muscular anomalies detected. Sensory (Neurological): Dermatomal pain pattern, arthropathic Palpation: No palpable anomalies         Gait & Posture Assessment  Ambulation: Limited Gait: Antalgic gait (limping) Posture: Difficulty standing up straight, due to pain    Lower Extremity Exam      Side: Right lower extremity   Side: Left lower extremity  Stability: No instability observed           Stability: No instability observed          Skin & Extremity Inspection: Skin color, temperature, and hair growth are WNL. No peripheral edema or cyanosis. No masses, redness, swelling, asymmetry, or associated skin lesions. No contractures.   Skin &  Extremity Inspection: Skin color, temperature, and hair growth are WNL. No peripheral edema or cyanosis. No masses, redness, swelling, asymmetry, or associated skin lesions. No contractures.  Functional ROM: Decreased ROM for hip and knee joints           Functional ROM: Decreased ROM for hip and knee joints          Muscle Tone/Strength:  Functionally intact. No obvious neuro-muscular anomalies detected.   Muscle Tone/Strength: Functionally intact. No obvious neuro-muscular anomalies detected.  Sensory (Neurological): Dermatomal pain pattern         Sensory (Neurological): Dermatomal pain pattern        DTR: Patellar: deferred today Achilles: deferred today Plantar: deferred today   DTR: Patellar: deferred today Achilles: deferred today Plantar: deferred today  Palpation: No palpable anomalies   Palpation: No palpable anomalies    Assessment   Status Diagnosis  Controlled Controlled Controlled 1. Lumbar degenerative disc disease   2. Lumbar radiculopathy   3. Spinal cord stimulator status   4. Spinal stenosis of lumbosacral region   5. Spondylosis of cervical region without myelopathy or radiculopathy   6. Chronic pain syndrome        Plan of Care  Connie [name] has a current medication list which includes the following long-term medication(s): azelastine, bupropion, levothyroxine, loratadine, metoprolol succinate, pregabalin, trazodone, azelastine, and pantoprazole.  Pharmacotherapy (Medications Ordered): Meds ordered this encounter  Medications   celecoxib (CELEBREX) 100 MG capsule    Sig: Take 1 capsule (100 mg total) by mouth 2 (two) times daily as needed.    Dispense:  60 capsule    Refill:  2   levorphanol (LEVODROMORAN) 2 MG tablet    Sig: Take 1 tablet (2 mg total) by mouth every 12 (twelve) hours as needed for pain. For chronic pain sydnrome    Dispense:  60 tablet    Refill:  0   levorphanol (LEVODROMORAN) 2 MG tablet    Sig: Take 1 tablet (2 mg total) by mouth every 12 (twelve) hours as needed for pain. For chronic pain sydnrome    Dispense:  60 tablet    Refill:  0   levorphanol (LEVODROMORAN) 2 MG tablet    Sig: Take 1 tablet (2 mg total) by mouth every 12 (twelve) hours as needed for pain. For chronic pain sydnrome    Dispense:  60 tablet    Refill:  0   methylPREDNISolone (MEDROL)  4 MG TBPK tablet    Sig: Follow package instructions.    Dispense:  21 tablet    Refill:  0    Do not add to the "Automatic Refill" notification system.    Continue psychiatric management.   Follow-up plan:   Return in about 4 months (around 04/01/2021) for Medication Management, in person.   Recent Visits Date Type Provider Dept  10/01/20 Office Visit [name], MD [facility]  Showing recent visits within past 90 days and meeting all other requirements Today's Visits Date Type Provider Dept  12/13/20 Office Visit [name], MD [facility]  Showing today's visits and meeting all other requirements Future Appointments No visits were found meeting these conditions. Showing future appointments within next 90 days and meeting all other requirements I discussed the assessment and treatment plan with the patient. The patient was provided an opportunity to ask questions and all were answered. The patient agreed with the plan and demonstrated an understanding of the instructions.  Patient advised to call back or  seek an in-person evaluation if the symptoms or condition worsens.  Duration of encounter: 30 minutes.  Note by: [name], MD Date: 12/13/2020; Time: 1:43 PM

## 2021-01-12 ENCOUNTER — Other Ambulatory Visit: Payer: Self-pay | Admitting: Student in an Organized Health Care Education/Training Program

## 2021-01-12 DIAGNOSIS — M5136 Other intervertebral disc degeneration, lumbar region: Secondary | ICD-10-CM

## 2021-01-12 DIAGNOSIS — M5416 Radiculopathy, lumbar region: Secondary | ICD-10-CM

## 2021-01-12 DIAGNOSIS — M4807 Spinal stenosis, lumbosacral region: Secondary | ICD-10-CM

## 2021-01-12 DIAGNOSIS — G894 Chronic pain syndrome: Secondary | ICD-10-CM

## 2021-01-31 ENCOUNTER — Other Ambulatory Visit: Payer: Self-pay | Admitting: Psychiatry

## 2021-01-31 DIAGNOSIS — F3342 Major depressive disorder, recurrent, in full remission: Secondary | ICD-10-CM

## 2021-03-10 ENCOUNTER — Other Ambulatory Visit: Payer: Self-pay | Admitting: Psychiatry

## 2021-03-10 ENCOUNTER — Other Ambulatory Visit: Payer: Self-pay | Admitting: Student in an Organized Health Care Education/Training Program

## 2021-03-10 DIAGNOSIS — F3341 Major depressive disorder, recurrent, in partial remission: Secondary | ICD-10-CM

## 2021-03-10 DIAGNOSIS — M5416 Radiculopathy, lumbar region: Secondary | ICD-10-CM

## 2021-03-10 DIAGNOSIS — F5105 Insomnia due to other mental disorder: Secondary | ICD-10-CM

## 2021-03-10 DIAGNOSIS — M5136 Other intervertebral disc degeneration, lumbar region: Secondary | ICD-10-CM

## 2021-03-10 DIAGNOSIS — G894 Chronic pain syndrome: Secondary | ICD-10-CM

## 2021-03-10 DIAGNOSIS — M4807 Spinal stenosis, lumbosacral region: Secondary | ICD-10-CM

## 2021-03-17 ENCOUNTER — Other Ambulatory Visit: Payer: Self-pay | Admitting: Psychiatry

## 2021-03-17 DIAGNOSIS — F3341 Major depressive disorder, recurrent, in partial remission: Secondary | ICD-10-CM

## 2021-03-18 ENCOUNTER — Other Ambulatory Visit: Payer: Self-pay | Admitting: Psychiatry

## 2021-03-18 DIAGNOSIS — F3342 Major depressive disorder, recurrent, in full remission: Secondary | ICD-10-CM

## 2021-03-28 ENCOUNTER — Ambulatory Visit
Payer: Medicare Other | Attending: Student in an Organized Health Care Education/Training Program | Admitting: Student in an Organized Health Care Education/Training Program

## 2021-03-28 ENCOUNTER — Encounter: Payer: Self-pay | Admitting: Student in an Organized Health Care Education/Training Program

## 2021-03-28 ENCOUNTER — Other Ambulatory Visit: Payer: Self-pay

## 2021-03-28 VITALS — BP 144/54 | HR 59 | Temp 97.4°F | Ht 60.0 in | Wt 270.0 lb

## 2021-03-28 DIAGNOSIS — M5416 Radiculopathy, lumbar region: Secondary | ICD-10-CM | POA: Diagnosis not present

## 2021-03-28 DIAGNOSIS — M47812 Spondylosis without myelopathy or radiculopathy, cervical region: Secondary | ICD-10-CM | POA: Insufficient documentation

## 2021-03-28 DIAGNOSIS — F1120 Opioid dependence, uncomplicated: Secondary | ICD-10-CM | POA: Diagnosis present

## 2021-03-28 DIAGNOSIS — Z981 Arthrodesis status: Secondary | ICD-10-CM | POA: Diagnosis present

## 2021-03-28 DIAGNOSIS — M5136 Other intervertebral disc degeneration, lumbar region: Secondary | ICD-10-CM | POA: Insufficient documentation

## 2021-03-28 DIAGNOSIS — Z9689 Presence of other specified functional implants: Secondary | ICD-10-CM | POA: Diagnosis not present

## 2021-03-28 DIAGNOSIS — M4807 Spinal stenosis, lumbosacral region: Secondary | ICD-10-CM | POA: Insufficient documentation

## 2021-03-28 DIAGNOSIS — G894 Chronic pain syndrome: Secondary | ICD-10-CM | POA: Insufficient documentation

## 2021-03-28 MED ORDER — LEVORPHANOL TARTRATE 2 MG PO TABS: 2.0000 mg | ORAL_TABLET | Freq: Three times a day (TID) | ORAL | 0 refills | Status: AC | PRN

## 2021-03-28 NOTE — Progress Notes (Signed)
Nursing Pain Medication Assessment:  Safety precautions to be maintained throughout the outpatient stay will include: orient to surroundings, keep bed in low position, maintain call bell within reach at all times, provide assistance with transfer out of bed and ambulation.  Medication Inspection Compliance: Pill count conducted under aseptic conditions, in front of the patient. Neither the pills nor the bottle was removed from the patient's sight at any time. Once count was completed pills were immediately returned to the patient in their original bottle.  Medication:  levorphanol Pill/Patch Count: 41/60 Pill/Patch Appearance: Markings consistent with prescribed medication Bottle Appearance: Standard pharmacy container. Clearly labeled. Filled Date: 102 / 18 / 2022 Last Medication intake:  Today

## 2021-03-28 NOTE — Patient Instructions (Signed)
Because our notes she has stated during work.  He also reports no recent

## 2021-03-28 NOTE — Progress Notes (Signed)
PROVIDER NOTE: Information contained herein reflects review and annotations entered in association with encounter. Interpretation of such information and data should be left to medically-trained personnel. Information provided to patient can be located elsewhere in the medical record under "Patient Instructions". Document created using STT-dictation technology, any transcriptional errors that may result from process are unintentional.    Patient: Connie [name]  Service Category: E/M  Provider: [name], MD  DOB: 07/03/1955  DOS: 03/28/2021  Specialty: Interventional Pain Management  MRN: [id number]  Setting: Ambulatory outpatient  PCP: Tracie Harrier, MD  Type: Established Patient    Referring Provider: Tracie Harrier, MD  Location: Office  Delivery: Face-to-face     HPI  Connie [name], a 65 y.o. year old female, is here today because of her Lumbar degenerative disc disease [M51.36]. Connie [name] primary complain today is Back Pain (lower) Last encounter: My last encounter with her was on 12/13/20 Pertinent problems: Connie [name] has Degenerative joint disease of cervical and lumbar spine; Lumbar spondylosis; History of lumbar fusion; Lumbar degenerative disc disease; Fibromyalgia; Chronic pain syndrome; Spinal cord stimulator status; Acquired spondylolisthesis; Chronic pain; Generalized osteoarthritis of multiple sites; Spinal stenosis of lumbar region without neurogenic claudication; and Lumbar post-laminectomy syndrome on their pertinent problem list. Pain Assessment: Severity of Chronic pain is reported as a 4 /10. Location: Back Lower/left leg to foot. Onset: More than a month ago. Quality: Numbness, Aching. Timing: Constant. Modifying factor(s): sitting, meds. Vitals:  height is 5' (1.524 m) and weight is 270 lb (122.5 kg). Her temporal temperature is 97.4 F (36.3 C) (abnormal). Her blood pressure is 144/54 (abnormal) and her pulse is 59 (abnormal). Her oxygen saturation is 99%.    Reason for encounter: medication management.    Patient presents today for medication management.  Her last clinic visit, her levorphanol was weaned down to twice daily dosing.  She states that this has resulted in increased pain and she would like to return back to her previous dosing at every 8 hours.  She is having increased lumbar radicular pain.  We discussed a caudal epidural steroid injection for radicular pain flare,  patient is interested in proceeding with that.   Pharmacotherapy Assessment   Analgesic: Levorphanol 2 mg TID  prn, #90/month   Monitoring: [place] PMP: PDMP not reviewed this encounter.       Pharmacotherapy: No side-effects or adverse reactions reported. Compliance: No problems identified. Effectiveness: Clinically acceptable.  Rise Patience, RN  03/28/2021 11:24 AM  Sign when Signing Visit Nursing Pain Medication Assessment:  Safety precautions to be maintained throughout the outpatient stay will include: orient to surroundings, keep bed in low position, maintain call bell within reach at all times, provide assistance with transfer out of bed and ambulation.  Medication Inspection Compliance: Pill count conducted under aseptic conditions, in front of the patient. Neither the pills nor the bottle was removed from the patient's sight at any time. Once count was completed pills were immediately returned to the patient in their original bottle.  Medication:  levorphanol Pill/Patch Count: 41/60 Pill/Patch Appearance: Markings consistent with prescribed medication Bottle Appearance: Standard pharmacy container. Clearly labeled. Filled Date: 52 / 18 / 2022 Last Medication intake:  Today      UDS:  Summary  Date Value Ref Range Status  08/07/2020 Note  Final    Comment:    ==================================================================== [employer] Select 13 (MW) ==================================================================== Test  Result       Flag       Units    NO DRUGS DETECTED. ==================================================================== Test                      Result    Flag   Units      Ref Range   Creatinine              131              mg/dL      >=20 ==================================================================== Declared Medications:  The flagging and interpretation on this report are based on the  following declared medications.  Unexpected results may arise from  inaccuracies in the declared medications.   **Note: The testing scope of this panel does not include the  following reported medications:   Azelastine (Astelin)  Bupropion (Wellbutrin)  Celecoxib (Celebrex)  Citalopram (Celexa)  Cyanocobalamin  Furosemide (Lasix)  Hydroxyzine (Vistaril)  Ibuprofen (Advil)  Levorphanol  Levothyroxine (Synthroid)  Loratadine (Claritin)  Methylphenidate (Ritalin)  Metoprolol (Toprol)  Ondansetron (Zofran)  Pantoprazole (Protonix)  Pregabalin (Lyrica)  Trazodone (Desyrel) ==================================================================== For clinical consultation, please call [phone]. ====================================================================      ROS  Constitutional: Denies any fever or chills Gastrointestinal: No reported hemesis, hematochezia, vomiting, or acute GI distress Musculoskeletal:  +LBP Neurological: No reported episodes of acute onset apraxia, aphasia, dysarthria, agnosia, amnesia, paralysis, loss of coordination, or loss of consciousness  Medication Review  azelastine, buPROPion, celecoxib, hydrOXYzine, levorphanol, levothyroxine, metoprolol succinate, pregabalin, traZODone, and vitamin B-12  History Review  Allergy: Connie [name] has No Known Allergies. Drug: Connie [name]  reports no history of drug use. Alcohol:  reports no history of alcohol use. Tobacco:  reports that she quit smoking about 13 years ago. Her smoking use included cigarettes. She  has a 10.00 pack-year smoking history. She has never used smokeless tobacco. Social: Connie [name]  reports that she quit smoking about 13 years ago. Her smoking use included cigarettes. She has a 10.00 pack-year smoking history. She has never used smokeless tobacco. She reports that she does not drink alcohol and does not use drugs. Medical:  has a past medical history of Anemia, Anxiety, Arthritis, Cervical spondylosis with myelopathy (2018), Chronic pain (2019), DDD (degenerative disc disease), lumbar, Depression, Displacement of cervical intervertebral disc (2018), Dyspnea, Fibromyalgia, GERD (gastroesophageal reflux disease), Heart murmur, Hypertension, Hypothyroidism, Lumbar post-laminectomy syndrome (2018), Lumbosacral radiculitis (2018), Neuropathy due to medical condition ([place]), Osteoarthritis, Osteoporosis, Other long term (current) drug therapy (2019), Pain in the coccyx (2018), Spondylolisthesis (2018), Spondylosis of lumbosacral region without myelopathy or radiculopathy (2018), Spondylosis with myelopathy, lumbar region (2018), and Vitamin D deficiency. Surgical: Connie [name]  has a past surgical history that includes Cesarean section; Gastric bypass (2004); Lumbar fusion (2016); Spinal cord stimulator implant (2018); Cholecystectomy; Appendectomy; Tonsillectomy; Shoulder arthroscopy with rotator cuff repair (Right, 2008); Joint replacement (Bilateral, 2005); Abdominal hysterectomy (2000); epidural injection (2018); arm surgery (2002); Breast biopsy (Left, 2005); Colonoscopy with propofol (N/A, 08/25/2017); Temporal artery biopsy / ligation; and Cataract extraction w/PHACO (Right, 10/21/2017). Family: family history includes Anxiety disorder in her mother; Breast cancer in her cousin, maternal aunt, and mother; Cancer in her brother, father, and mother; Depression in her mother; Hypertension in her father; Kidney disease in her mother.  Laboratory Chemistry Profile   Renal Lab Results  Component  Value Date   BUN 18 06/23/2017   CREATININE 0.60 06/23/2017   GFRAA >60 06/23/2017   GFRNONAA >60 06/23/2017  Hepatic Lab Results  Component Value Date   AST 20 06/23/2017   ALT 9 (L) 06/23/2017   ALBUMIN 3.6 06/23/2017   ALKPHOS 88 06/23/2017     Electrolytes Lab Results  Component Value Date   NA 135 06/23/2017   K 4.9 06/23/2017   CL 102 06/23/2017   CALCIUM 8.5 (L) 06/23/2017     Bone No results found for: VD25OH, VD125OH2TOT, SA6301SW1, UX3235TD3, 25OHVITD1, 25OHVITD2, 25OHVITD3, TESTOFREE, TESTOSTERONE   Inflammation (CRP: Acute Phase) (ESR: Chronic Phase) No results found for: CRP, ESRSEDRATE, LATICACIDVEN     Note: Above Lab results reviewed.  Recent Imaging Review  MM 3D SCREEN BREAST BILATERAL CLINICAL DATA:  Screening.  EXAM: DIGITAL SCREENING BILATERAL MAMMOGRAM WITH TOMOSYNTHESIS AND CAD  TECHNIQUE: Bilateral screening digital craniocaudal and mediolateral oblique mammograms were obtained. Bilateral screening digital breast tomosynthesis was performed. The images were evaluated with computer-aided detection.  COMPARISON:  Previous exam(s).  ACR Breast Density Category b: There are scattered areas of fibroglandular density.  FINDINGS: There are no findings suspicious for malignancy.  IMPRESSION: No mammographic evidence of malignancy. A result letter of this screening mammogram will be mailed directly to the patient.  RECOMMENDATION: Screening mammogram in one year. (Code:SM-B-01Y)  BI-RADS CATEGORY  1: Negative.  Electronically Signed   By: Fidela Salisbury M.D.   On: 06/21/2020 16:51 Note: Reviewed        Physical Exam  General appearance: Well nourished, well developed, and well hydrated. In no apparent acute distress Mental status: Alert, oriented x 3 (person, place, & time)       Respiratory: No evidence of acute respiratory distress Eyes: PERLA Vitals: BP (!) 144/54   Pulse (!) 59   Temp (!) 97.4 F (36.3 C) (Temporal)    Ht 5' (1.524 m)   Wt 270 lb (122.5 kg)   SpO2 99%   BMI 52.73 kg/m  BMI: Estimated body mass index is 52.73 kg/m as calculated from the following:   Height as of this encounter: 5' (1.524 m).   Weight as of this encounter: 270 lb (122.5 kg). Ideal: Ideal body weight: 45.5 kg (100 lb 4.9 oz) Adjusted ideal body weight: 76.3 kg (168 lb 3 oz)  Lumbar Spine Area Exam  Skin & Axial Inspection: Well healed scar from previous spine surgery detected spinal cord stimulator in place with IPG present Alignment: Symmetrical Functional ROM: Decreased ROM affecting both sides Stability: No instability detected Muscle Tone/Strength: Functionally intact. No obvious neuro-muscular anomalies detected. Sensory (Neurological): Dermatomal pain pattern, arthropathic Palpation: No palpable anomalies         Gait & Posture Assessment  Ambulation: Limited Gait: Antalgic gait (limping) Posture: Difficulty standing up straight, due to pain    Lower Extremity Exam      Side: Right lower extremity   Side: Left lower extremity  Stability: No instability observed           Stability: No instability observed          Skin & Extremity Inspection: Skin color, temperature, and hair growth are WNL. No peripheral edema or cyanosis. No masses, redness, swelling, asymmetry, or associated skin lesions. No contractures.   Skin & Extremity Inspection: Skin color, temperature, and hair growth are WNL. No peripheral edema or cyanosis. No masses, redness, swelling, asymmetry, or associated skin lesions. No contractures.  Functional ROM: Decreased ROM for hip and knee joints           Functional ROM: Decreased ROM for hip and knee joints  Muscle Tone/Strength: Functionally intact. No obvious neuro-muscular anomalies detected.   Muscle Tone/Strength: Functionally intact. No obvious neuro-muscular anomalies detected.  Sensory (Neurological): Dermatomal pain pattern         Sensory (Neurological): Dermatomal pain pattern         DTR: Patellar: deferred today Achilles: deferred today Plantar: deferred today   DTR: Patellar: deferred today Achilles: deferred today Plantar: deferred today  Palpation: No palpable anomalies   Palpation: No palpable anomalies    Assessment   Status Diagnosis  Controlled Having a Flare-up Controlled 1. Lumbar degenerative disc disease   2. Lumbar radiculopathy   3. Spinal cord stimulator status   4. Spinal stenosis of lumbosacral region   5. Spondylosis of cervical region without myelopathy or radiculopathy   6. History of lumbar fusion   7. Opioid type dependence, continuous ([place])   8. Chronic pain syndrome        Plan of Care  Connie [name] has a current medication list which includes the following long-term medication(s): azelastine, bupropion, levothyroxine, metoprolol succinate, pregabalin, and trazodone.  Pharmacotherapy (Medications Ordered): Meds ordered this encounter  Medications   levorphanol (LEVODROMORAN) 2 MG tablet    Sig: Take 1 tablet (2 mg total) by mouth every 8 (eight) hours as needed for pain. For chronic pain sydnrome    Dispense:  90 tablet    Refill:  0   levorphanol (LEVODROMORAN) 2 MG tablet    Sig: Take 1 tablet (2 mg total) by mouth every 8 (eight) hours as needed for pain. For chronic pain sydnrome    Dispense:  90 tablet    Refill:  0   levorphanol (LEVODROMORAN) 2 MG tablet    Sig: Take 1 tablet (2 mg total) by mouth every 8 (eight) hours as needed for pain. For chronic pain sydnrome    Dispense:  90 tablet    Refill:  0    Orders Placed This Encounter  Procedures   Caudal Epidural Injection    Standing Status:   Future    Standing Expiration Date:   04/28/2021    Scheduling Instructions:     Laterality: Midline     Level(s): Sacrococcygeal canal (Tailbone area)     Sedation:without     Scheduling Timeframe: As soon as pre-approved    Order Specific Question:   Where will this procedure be performed?    Answer:    [facility]     Continue psychiatric management.   Follow-up plan:   Return in about 2 weeks (around 04/11/2021) for Caudal ESI.   Recent Visits No visits were found meeting these conditions. Showing recent visits within past 90 days and meeting all other requirements Today's Visits Date Type Provider Dept  03/28/21 Office Visit [name], MD [facility]  Showing today's visits and meeting all other requirements Future Appointments No visits were found meeting these conditions. Showing future appointments within next 90 days and meeting all other requirements I discussed the assessment and treatment plan with the patient. The patient was provided an opportunity to ask questions and all were answered. The patient agreed with the plan and demonstrated an understanding of the instructions.  Patient advised to call back or seek an in-person evaluation if the symptoms or condition worsens.  Duration of encounter: 30 minutes.  Note by: [name], MD Date: 03/28/2021; Time: 1:16 PM

## 2021-04-15 ENCOUNTER — Telehealth: Payer: Self-pay

## 2021-04-15 NOTE — Telephone Encounter (Signed)
Voicemail left for patient that there will not be qty increase.  It will stay qty 90 and she should also try the caudal epidural that was ordered.

## 2021-04-15 NOTE — Telephone Encounter (Signed)
Patient called and left vm stating that her meds are due today and Dr. Holley Raring told her there would be a script at the pharmacy but [facility] says there is no script. She wants someone to fix this asap.

## 2021-04-17 ENCOUNTER — Ambulatory Visit: Payer: Medicare Other | Admitting: Student in an Organized Health Care Education/Training Program

## 2021-04-17 ENCOUNTER — Ambulatory Visit
Payer: Medicare Other | Attending: Student in an Organized Health Care Education/Training Program | Admitting: Student in an Organized Health Care Education/Training Program

## 2021-04-28 DIAGNOSIS — G629 Polyneuropathy, unspecified: Secondary | ICD-10-CM | POA: Insufficient documentation

## 2021-04-28 DIAGNOSIS — Z87891 Personal history of nicotine dependence: Secondary | ICD-10-CM | POA: Insufficient documentation

## 2021-04-28 DIAGNOSIS — G4733 Obstructive sleep apnea (adult) (pediatric): Secondary | ICD-10-CM | POA: Insufficient documentation

## 2021-05-01 ENCOUNTER — Ambulatory Visit (HOSPITAL_BASED_OUTPATIENT_CLINIC_OR_DEPARTMENT_OTHER): Payer: Medicare Other | Admitting: Student in an Organized Health Care Education/Training Program

## 2021-05-01 ENCOUNTER — Other Ambulatory Visit: Payer: Self-pay

## 2021-05-01 ENCOUNTER — Ambulatory Visit
Admission: RE | Admit: 2021-05-01 | Discharge: 2021-05-01 | Disposition: A | Discharge status: Home / Self Care | Payer: Medicare Other | Source: Ambulatory Visit | Attending: Student in an Organized Health Care Education/Training Program | Admitting: Student in an Organized Health Care Education/Training Program

## 2021-05-01 ENCOUNTER — Encounter: Payer: Self-pay | Admitting: Student in an Organized Health Care Education/Training Program

## 2021-05-01 VITALS — BP 140/67 | HR 62 | Temp 98.4°F | Resp 10 | Ht 60.0 in | Wt 250.0 lb

## 2021-05-01 DIAGNOSIS — M5416 Radiculopathy, lumbar region: Secondary | ICD-10-CM

## 2021-05-01 DIAGNOSIS — M4807 Spinal stenosis, lumbosacral region: Secondary | ICD-10-CM | POA: Diagnosis not present

## 2021-05-01 DIAGNOSIS — G894 Chronic pain syndrome: Secondary | ICD-10-CM

## 2021-05-01 MED ORDER — DEXAMETHASONE SODIUM PHOSPHATE 10 MG/ML IJ SOLN
INTRAMUSCULAR | Status: AC
  Filled 2021-05-01: qty 1

## 2021-05-01 MED ORDER — IOHEXOL 180 MG/ML  SOLN
10.0000 mL | Freq: Once | INTRAMUSCULAR | Status: AC
  Administered 2021-05-01: 5 mL via EPIDURAL

## 2021-05-01 MED ORDER — DEXAMETHASONE SODIUM PHOSPHATE 10 MG/ML IJ SOLN
10.0000 mg | Freq: Once | INTRAMUSCULAR | Status: AC
  Administered 2021-05-01: 10 mg

## 2021-05-01 MED ORDER — ROPIVACAINE HCL 2 MG/ML IJ SOLN
INTRAMUSCULAR | Status: AC
  Filled 2021-05-01: qty 20

## 2021-05-01 MED ORDER — LIDOCAINE HCL 2 % IJ SOLN
INTRAMUSCULAR | Status: AC
  Filled 2021-05-01: qty 20

## 2021-05-01 MED ORDER — ROPIVACAINE HCL 2 MG/ML IJ SOLN
2.0000 mL | Freq: Once | INTRAMUSCULAR | Status: AC
  Administered 2021-05-01: 20 mL via EPIDURAL

## 2021-05-01 MED ORDER — LIDOCAINE HCL 2 % IJ SOLN
20.0000 mL | Freq: Once | INTRAMUSCULAR | Status: AC
  Administered 2021-05-01: 400 mg

## 2021-05-01 MED ORDER — SODIUM CHLORIDE (PF) 0.9 % IJ SOLN
INTRAMUSCULAR | Status: AC
  Filled 2021-05-01: qty 10

## 2021-05-01 MED ORDER — SODIUM CHLORIDE 0.9% FLUSH
2.0000 mL | Freq: Once | INTRAVENOUS | Status: AC
  Administered 2021-05-01: 10 mL

## 2021-05-01 NOTE — Patient Instructions (Signed)

## 2021-05-01 NOTE — Progress Notes (Signed)
Safety precautions to be maintained throughout the outpatient stay will include: orient to surroundings, keep bed in low position, maintain call bell within reach at all times, provide assistance with transfer out of bed and ambulation.  

## 2021-05-01 NOTE — Progress Notes (Signed)
PROVIDER NOTE: Interpretation of information contained herein should be left to medically-trained personnel. Specific patient instructions are provided elsewhere under "Patient Instructions" section of medical record. This document was created in part using STT-dictation technology, any transcriptional errors that may result from this process are unintentional.  Patient: Connie [name] Type: Established DOB: Mar 05, 1956 MRN: [id number] PCP: Tracie Harrier, MD  Service: Procedure DOS: 05/01/2021 Setting: Ambulatory Location: Ambulatory outpatient facility Delivery: Face-to-face Provider: [name], MD Specialty: Interventional Pain Management Specialty designation: 09 Location: Outpatient facility Ref. Prov.: Tracie Harrier, MD    Primary Reason for Visit: Interventional Pain Management Treatment. CC: Hip Pain (left) and Back Pain (low)    Procedure:          Anesthesia, Analgesia, Anxiolysis:  Type: Diagnostic Epidural Steroid Injection #1  Region: Caudal Level: Sacrococcygeal   Laterality: Midline       Anesthesia: Local (1-2% Lidocaine)  Anxiolysis: None  Sedation: None  Guidance: Fluoroscopy           Position: Prone   1. Lumbar radiculopathy   2. Spinal stenosis of lumbosacral region   3. Chronic pain syndrome    NAS-11 Pain score:   Pre-procedure: 7 /10   Post-procedure: 6 /10     Pre-op H&P Assessment:  Connie [name] is a 66 y.o. (year old), female patient, seen today for interventional treatment. She  has a past surgical history that includes Cesarean section; Gastric bypass (2004); Lumbar fusion (2016); Spinal cord stimulator implant (2018); Cholecystectomy; Appendectomy; Tonsillectomy; Shoulder arthroscopy with rotator cuff repair (Right, 2008); Joint replacement (Bilateral, 2005); Abdominal hysterectomy (2000); epidural injection (2018); arm surgery (2002); Breast biopsy (Left, 2005); Colonoscopy with propofol (N/A, 08/25/2017); Temporal artery biopsy / ligation;  and Cataract extraction w/PHACO (Right, 10/21/2017). Connie [name] has a current medication list which includes the following prescription(s): azelastine, bupropion, celecoxib, hydroxyzine, levorphanol, [START ON 05/15/2021] levorphanol, [START ON 06/14/2021] levorphanol, levothyroxine, metoprolol succinate, pregabalin, trazodone, and vitamin b-12. Her primarily concern today is the Hip Pain (left) and Back Pain (low)  Initial Vital Signs:  Pulse/HCG Rate: 62ECG Heart Rate: 72 Temp: 98.4 F (36.9 C) Resp: 18 BP:  (!) 147/59 SpO2: 95 %  BMI: Estimated body mass index is 48.82 kg/m as calculated from the following:   Height as of this encounter: 5' (1.524 m).   Weight as of this encounter: 250 lb (113.4 kg).  Risk Assessment: Allergies: Reviewed. She has No Known Allergies.  Allergy Precautions: None required Coagulopathies: Reviewed. None identified.  Blood-thinner therapy: None at this time Active Infection(s): Reviewed. None identified. Connie [name] is afebrile  Site Confirmation: Connie [name] was asked to confirm the procedure and laterality before marking the site Procedure checklist: Completed Consent: Before the procedure and under the influence of no sedative(s), amnesic(s), or anxiolytics, the patient was informed of the treatment options, risks and possible complications. To fulfill our ethical and legal obligations, as recommended by the American Medical Association's Code of Ethics, I have informed the patient of my clinical impression; the nature and purpose of the treatment or procedure; the risks, benefits, and possible complications of the intervention; the alternatives, including doing nothing; the risk(s) and benefit(s) of the alternative treatment(s) or procedure(s); and the risk(s) and benefit(s) of doing nothing. The patient was provided information about the general risks and possible complications associated with the procedure. These may include, but are not limited to: failure  to achieve desired goals, infection, bleeding, organ or nerve damage, allergic reactions, paralysis, and death. In addition, the patient was  informed of those risks and complications associated to Spine-related procedures, such as failure to decrease pain; infection (i.e.: Meningitis, epidural or intraspinal abscess); bleeding (i.e.: epidural hematoma, subarachnoid hemorrhage, or any other type of intraspinal or peri-dural bleeding); organ or nerve damage (i.e.: Any type of peripheral nerve, nerve root, or spinal cord injury) with subsequent damage to sensory, motor, and/or autonomic systems, resulting in permanent pain, numbness, and/or weakness of one or several areas of the body; allergic reactions; (i.e.: anaphylactic reaction); and/or death. Furthermore, the patient was informed of those risks and complications associated with the medications. These include, but are not limited to: allergic reactions (i.e.: anaphylactic or anaphylactoid reaction(s)); adrenal axis suppression; blood sugar elevation that in diabetics may result in ketoacidosis or comma; water retention that in patients with history of congestive heart failure may result in shortness of breath, pulmonary edema, and decompensation with resultant heart failure; weight gain; swelling or edema; medication-induced neural toxicity; particulate matter embolism and blood vessel occlusion with resultant organ, and/or nervous system infarction; and/or aseptic necrosis of one or more joints. Finally, the patient was informed that Medicine is not an exact science; therefore, there is also the possibility of unforeseen or unpredictable risks and/or possible complications that may result in a catastrophic outcome. The patient indicated having understood very clearly. We have given the patient no guarantees and we have made no promises. Enough time was given to the patient to ask questions, all of which were answered to the patient's satisfaction. Connie [name]  has indicated that she wanted to continue with the procedure. Attestation: I, the ordering provider, attest that I have discussed with the patient the benefits, risks, side-effects, alternatives, likelihood of achieving goals, and potential problems during recovery for the procedure that I have provided informed consent. Date   Time: 05/01/2021 11:39 AM  Pre-Procedure Preparation:  Monitoring: As per clinic protocol. Respiration, ETCO2, SpO2, BP, heart rate and rhythm monitor placed and checked for adequate function Safety Precautions: Patient was assessed for positional comfort and pressure points before starting the procedure. Time-out: I initiated and conducted the "Time-out" before starting the procedure, as per protocol. The patient was asked to participate by confirming the accuracy of the "Time Out" information. Verification of the correct person, site, and procedure were performed and confirmed by me, the nursing staff, and the patient. "Time-out" conducted as per Joint Commission's Universal Protocol (UP.01.01.01). Time: 1158  Description of Procedure:          Target Area: Caudal Epidural Canal. Approach: Midline approach. Area Prepped: Entire Posterior Sacrococcygeal Region DuraPrep (Iodine Povacrylex [0.7% available iodine] and Isopropyl Alcohol, 74% w/w) Safety Precautions: Aspiration looking for blood return was conducted prior to all injections. At no point did we inject any substances, as a needle was being advanced. No attempts were made at seeking any paresthesias. Safe injection practices and needle disposal techniques used. Medications properly checked for expiration dates. SDV (single dose vial) medications used. Description of the Procedure: Protocol guidelines were followed. The patient was placed in position over the fluoroscopy table. The target area was identified and the area prepped in the usual manner. Skin & deeper tissues infiltrated with local anesthetic. Appropriate  amount of time allowed to pass for local anesthetics to take effect. The procedure needles were then advanced to the target area. Proper needle placement secured. Negative aspiration confirmed. Solution injected in intermittent fashion, asking for systemic symptoms every 0.5cc of injectate. The needles were then removed and the area cleansed, making sure to leave some  of the prepping solution back to take advantage of its long term bactericidal properties. Vitals:   05/01/21 1142 05/01/21 1200 05/01/21 1205  BP: (!) 147/59 (!) 148/72 140/67  Pulse: 62    Resp: 18 10 10   Temp: 98.4 F (36.9 C)    TempSrc: Oral    SpO2: 95% 93% 93%  Weight: 250 lb (113.4 kg)    Height: 5' (1.524 m)      Start Time: 1158 hrs. End Time: 1204 hrs. Materials:  Tray: Epidural Needle(s) Type: Epidural needle Gauge: 22G Length: 3.5-in  6 cc solution made of 3 cc of preservative-free saline, 2 cc of 0.2% ropivacaine, 1 cc of Decadron 10 mg/cc.  Imaging Guidance (Spinal):          Type of Imaging Technique: Fluoroscopy Guidance (Spinal) Indication(s): Assistance in needle guidance and placement for procedures requiring needle placement in or near specific anatomical locations not easily accessible without such assistance. Exposure Time: Please see nurses notes. Contrast: Before injecting any contrast, we confirmed that the patient did not have an allergy to iodine, shellfish, or radiological contrast. Once satisfactory needle placement was completed at the desired level, radiological contrast was injected. Contrast injected under live fluoroscopy. No contrast complications. See chart for type and volume of contrast used. Fluoroscopic Guidance: I was personally present during the use of fluoroscopy. "Tunnel Vision Technique" used to obtain the best possible view of the target area. Parallax error corrected before commencing the procedure. "Direction-depth-direction" technique used to introduce the needle under  continuous pulsed fluoroscopy. Once target was reached, antero-posterior, oblique, and lateral fluoroscopic projection used confirm needle placement in all planes. Images permanently stored in EMR. Interpretation: I personally interpreted the imaging intraoperatively. Adequate needle placement confirmed in multiple planes. Appropriate spread of contrast into desired area was observed. No evidence of afferent or efferent intravascular uptake. No intrathecal or subarachnoid spread observed. Permanent images saved into the patient's record.   Post-operative Assessment:  Post-procedure Vital Signs:  Pulse/HCG Rate: 6262 Temp: 98.4 F (36.9 C) Resp: 10 BP:  140/67 SpO2: 93 %  EBL: None  Complications: No immediate post-treatment complications observed by team, or reported by patient.  Note: The patient tolerated the entire procedure well. A repeat set of vitals were taken after the procedure and the patient was kept under observation following institutional policy, for this type of procedure. Post-procedural neurological assessment was performed, showing return to baseline, prior to discharge. The patient was provided with post-procedure discharge instructions, including a section on how to identify potential problems. Should any problems arise concerning this procedure, the patient was given instructions to immediately contact us, at any time, without hesitation. In any case, we plan to contact the patient by telephone for a follow-up status report regarding this interventional procedure.  Comments:  No additional relevant information.  Plan of Care  Orders:  Orders Placed This Encounter  Procedures   DG PAIN [facility] C-ARM 1-60 MIN NO REPORT    Intraoperative interpretation by procedural physician at [facility].    Standing Status:   Standing    Number of Occurrences:   1    Order Specific Question:   Reason for exam:    Answer:   Assistance in needle guidance and placement for  procedures requiring needle placement in or near specific anatomical locations not easily accessible without such assistance.   Chronic Opioid Analgesic:  Levorphanol 2 mg BID  prn, #60/month   Medications ordered for procedure: Meds ordered this encounter  Medications  iohexol (OMNIPAQUE) 180 MG/ML injection 10 mL    Must be Myelogram-compatible. If not available, you may substitute with a water-soluble, non-ionic, hypoallergenic, myelogram-compatible radiological contrast medium.   lidocaine (XYLOCAINE) 2 % (with pres) injection 400 mg   sodium chloride flush (NS) 0.9 % injection 2 mL   ropivacaine (PF) 2 mg/mL (0.2%) (NAROPIN) injection 2 mL   dexamethasone (DECADRON) injection 10 mg   Medications administered: We administered iohexol, lidocaine, sodium chloride flush, ropivacaine (PF) 2 mg/mL (0.2%), and dexamethasone.  See the medical record for exact dosing, route, and time of administration.  Follow-up plan:   Return in about 5 weeks (around 06/05/2021) for Post Procedure Evaluation, virtual.     Recent Visits Date Type Provider Dept  03/28/21 Office Visit [name], MD [facility]  Showing recent visits within past 90 days and meeting all other requirements Today's Visits Date Type Provider Dept  05/01/21 Procedure visit [name], MD [facility]  Showing today's visits and meeting all other requirements Future Appointments Date Type Provider Dept  06/05/21 Appointment [name], MD [facility]  07/09/21 Appointment [name], MD [facility]  Showing future appointments within next 90 days and meeting all other requirements  Disposition: Discharge home  Discharge (Date   Time): 05/01/2021; 1215 hrs.   Primary Care Physician: Tracie Harrier, MD Location: [facility] Note by: [name], MD Date: 05/01/2021; Time: 12:51 PM  Disclaimer:  Medicine is not an exact science. The only  guarantee in medicine is that nothing is guaranteed. It is important to note that the decision to proceed with this intervention was based on the information collected from the patient. The Data and conclusions were drawn from the patient's questionnaire, the interview, and the physical examination. Because the information was provided in large part by the patient, it cannot be guaranteed that it has not been purposely or unconsciously manipulated. Every effort has been made to obtain as much relevant data as possible for this evaluation. It is important to note that the conclusions that lead to this procedure are derived in large part from the available data. Always take into account that the treatment will also be dependent on availability of resources and existing treatment guidelines, considered by other Pain Management Practitioners as being common knowledge and practice, at the time of the intervention. For Medico-Legal purposes, it is also important to point out that variation in procedural techniques and pharmacological choices are the acceptable norm. The indications, contraindications, technique, and results of the above procedure should only be interpreted and judged by a Board-Certified Interventional Pain Specialist with extensive familiarity and expertise in the same exact procedure and technique.

## 2021-05-02 ENCOUNTER — Telehealth: Payer: Self-pay

## 2021-05-02 NOTE — Telephone Encounter (Signed)
Called PP. States she had a rough night. Pain is better but had diarrhea and a slight headache. Instructed to call if needed.

## 2021-06-04 ENCOUNTER — Telehealth: Payer: Self-pay

## 2021-06-04 NOTE — Telephone Encounter (Signed)
LM for patient to call office for pre virtual appointment questions.  

## 2021-06-05 ENCOUNTER — Other Ambulatory Visit: Payer: Self-pay

## 2021-06-05 ENCOUNTER — Ambulatory Visit
Payer: Medicare Other | Attending: Student in an Organized Health Care Education/Training Program | Admitting: Student in an Organized Health Care Education/Training Program

## 2021-06-05 ENCOUNTER — Encounter: Payer: Self-pay | Admitting: Student in an Organized Health Care Education/Training Program

## 2021-06-05 DIAGNOSIS — M4807 Spinal stenosis, lumbosacral region: Secondary | ICD-10-CM | POA: Diagnosis not present

## 2021-06-05 DIAGNOSIS — M5416 Radiculopathy, lumbar region: Secondary | ICD-10-CM | POA: Diagnosis not present

## 2021-06-05 DIAGNOSIS — M5136 Other intervertebral disc degeneration, lumbar region: Secondary | ICD-10-CM | POA: Diagnosis not present

## 2021-06-05 DIAGNOSIS — Z9689 Presence of other specified functional implants: Secondary | ICD-10-CM | POA: Diagnosis not present

## 2021-06-05 DIAGNOSIS — G894 Chronic pain syndrome: Secondary | ICD-10-CM

## 2021-06-05 NOTE — Progress Notes (Signed)
Patient: Connie [name]  Service Category: E/M  Provider: [name], MD  DOB: 02-07-1956  DOS: 06/05/2021  Location: Office  MRN: [id number]  Setting: Ambulatory outpatient  Referring Provider: Tracie Harrier, MD  Type: Established Patient  Specialty: Interventional Pain Management  PCP: Tracie Harrier, MD  Location: Remote location  Delivery: TeleHealth     Virtual Encounter - Pain Management PROVIDER NOTE: Information contained herein reflects review and annotations entered in association with encounter. Interpretation of such information and data should be left to medically-trained personnel. Information provided to patient can be located elsewhere in the medical record under "Patient Instructions". Document created using STT-dictation technology, any transcriptional errors that may result from process are unintentional.    Contact & Pharmacy Preferred: [phone] Home: [phone] (home) Mobile: [phone] (mobile) E-mail: [email or web address]  [facility] [place], [place] [address] [place] [address] Phone: [phone] Fax: [phone]   Pre-screening  Ms. Hedstrom offered "in-person" vs "virtual" encounter. She indicated preferring virtual for this encounter.   Reason COVID-19*   Social distancing based on CDC and AMA recommendations.   I contacted Larna Capelle Rocca on 06/05/2021 via telephone.      I clearly identified myself as [name], MD. I verified that I was speaking with the correct person using two identifiers (Name: Estrellita Lasky, and date of birth: 1955/06/27).  Consent I sought verbal advanced consent from [place] for virtual visit interactions. I informed Ms. Lasseter of possible security and privacy concerns, risks, and limitations associated with providing "not-in-person" medical evaluation and management services. I also informed Ms. Correa of the availability of  "in-person" appointments. Finally, I informed her that there would be a charge for the virtual visit and that she could be  personally, fully or partially, financially responsible for it. Ms. Sangster expressed understanding and agreed to proceed.   Historic Elements   Ms. Jajaira Ruis is a 66 y.o. year old, female patient evaluated today after our last contact on 05/01/2021. Ms. Schlup  has a past medical history of Anemia, Anxiety, Arthritis, Cervical spondylosis with myelopathy (2018), Chronic pain (2019), DDD (degenerative disc disease), lumbar, Depression, Displacement of cervical intervertebral disc (2018), Dyspnea, Fibromyalgia, GERD (gastroesophageal reflux disease), Heart murmur, Hypertension, Hypothyroidism, Lumbar post-laminectomy syndrome (2018), Lumbosacral radiculitis (2018), Neuropathy due to medical condition ([place]), Osteoarthritis, Osteoporosis, Other long term (current) drug therapy (2019), Pain in the coccyx (2018), Spondylolisthesis (2018), Spondylosis of lumbosacral region without myelopathy or radiculopathy (2018), Spondylosis with myelopathy, lumbar region (2018), and Vitamin D deficiency. She also  has a past surgical history that includes Cesarean section; Gastric bypass (2004); Lumbar fusion (2016); Spinal cord stimulator implant (2018); Cholecystectomy; Appendectomy; Tonsillectomy; Shoulder arthroscopy with rotator cuff repair (Right, 2008); Joint replacement (Bilateral, 2005); Abdominal hysterectomy (2000); epidural injection (2018); arm surgery (2002); Breast biopsy (Left, 2005); Colonoscopy with propofol (N/A, 08/25/2017); Temporal artery biopsy / ligation; and Cataract extraction w/PHACO (Right, 10/21/2017). Ms. Kaus has a current medication list which includes the following prescription(s): azelastine, bupropion, celecoxib, hydroxyzine, levorphanol, [START ON 06/14/2021] levorphanol, levothyroxine, metoprolol succinate, pregabalin, trazodone, and vitamin b-12. She  reports that she  quit smoking about 14 years ago. Her smoking use included cigarettes. She has a 10.00 pack-year smoking history. She has never used smokeless tobacco. She reports that she does not drink alcohol and does not use drugs. Ms. Schlender has No Known Allergies.   HPI  Today, she  is being contacted for a post-procedure assessment.   Post-procedure evaluation     Procedure:          Anesthesia, Analgesia, Anxiolysis:  Type: Diagnostic Epidural Steroid Injection #1  Region: Caudal Level: Sacrococcygeal   Laterality: Midline       Anesthesia: Local (1-2% Lidocaine)  Anxiolysis: None  Sedation: None  Guidance: Fluoroscopy           Position: Prone   1. Lumbar radiculopathy   2. Spinal stenosis of lumbosacral region   3. Chronic pain syndrome    NAS-11 Pain score:   Pre-procedure: 7 /10   Post-procedure: 6 /10      Effectiveness:  Initial hour after procedure: 40 %  Subsequent 4-6 hours post-procedure: 40 %  Analgesia past initial 6 hours: 75 %  Ongoing improvement:  Analgesic:  75% Function: Ms. Valencia reports improvement in function ROM: Ms. Denapoli reports improvement in ROM   Pharmacotherapy Assessment   Opioid Analgesic: Levorphanol 2 mg BID  prn, #60/month   Monitoring: [place] PMP: PDMP reviewed during this encounter.       Pharmacotherapy: No side-effects or adverse reactions reported. Compliance: No problems identified. Effectiveness: Clinically acceptable. Plan: Refer to "POC". UDS:  Summary  Date Value Ref Range Status  08/07/2020 Note  Final    Comment:    ==================================================================== [employer] Select 13 (MW) ==================================================================== Test                             Result       Flag       Units    NO DRUGS DETECTED. ==================================================================== Test                      Result    Flag   Units      Ref Range   Creatinine              131               mg/dL      >=20 ==================================================================== Declared Medications:  The flagging and interpretation on this report are based on the  following declared medications.  Unexpected results may arise from  inaccuracies in the declared medications.   **Note: The testing scope of this panel does not include the  following reported medications:   Azelastine (Astelin)  Bupropion (Wellbutrin)  Celecoxib (Celebrex)  Citalopram (Celexa)  Cyanocobalamin  Furosemide (Lasix)  Hydroxyzine (Vistaril)  Ibuprofen (Advil)  Levorphanol  Levothyroxine (Synthroid)  Loratadine (Claritin)  Methylphenidate (Ritalin)  Metoprolol (Toprol)  Ondansetron (Zofran)  Pantoprazole (Protonix)  Pregabalin (Lyrica)  Trazodone (Desyrel) ==================================================================== For clinical consultation, please call [phone]. ====================================================================      Laboratory Chemistry Profile   Renal Lab Results  Component Value Date   BUN 18 06/23/2017   CREATININE 0.60 06/23/2017   GFRAA >60 06/23/2017   GFRNONAA >60 06/23/2017    Hepatic Lab Results  Component Value Date   AST 20 06/23/2017   ALT 9 (L) 06/23/2017   ALBUMIN 3.6 06/23/2017   ALKPHOS 88 06/23/2017    Electrolytes Lab Results  Component Value Date   NA 135 06/23/2017   K 4.9 06/23/2017   CL 102 06/23/2017   CALCIUM 8.5 (L) 06/23/2017    Bone No results found for: VD25OH, VD125OH2TOT, PZ0258NI7, PO2423NT6, 25OHVITD1, 25OHVITD2, 25OHVITD3, TESTOFREE, TESTOSTERONE  Inflammation (CRP: Acute Phase) (ESR: Chronic Phase) No results found for: CRP, ESRSEDRATE, LATICACIDVEN  Note: Above Lab results reviewed.   Assessment  The primary encounter diagnosis was Lumbar radiculopathy. Diagnoses of Spinal stenosis of lumbosacral region, Lumbar degenerative disc disease, Spinal cord stimulator status, and Chronic pain  syndrome were also pertinent to this visit.  Plan of Care   Significant pain relief after caudal epidural steroid injection.  Patient states that the majority of her "sciatica pain" is gone.  She states that she has not experienced this with other injections that she has had with previous providers.  We will continue to follow her symptoms.  She has a medication refill appointment scheduled for next month which I encouraged her to keep and I will see her then for follow-up.    Follow-up plan:   Return for Keep sch. appt.      Recent Visits Date Type Provider Dept  05/01/21 Procedure visit [name], MD [facility]  03/28/21 Office Visit [name], MD [facility]  Showing recent visits within past 90 days and meeting all other requirements Today's Visits Date Type Provider Dept  06/05/21 Office Visit [name], MD [facility]  Showing today's visits and meeting all other requirements Future Appointments Date Type Provider Dept  07/09/21 Appointment [name], MD [facility]  Showing future appointments within next 90 days and meeting all other requirements  I discussed the assessment and treatment plan with the patient. The patient was provided an opportunity to ask questions and all were answered. The patient agreed with the plan and demonstrated an understanding of the instructions.  Patient advised to call back or seek an in-person evaluation if the symptoms or condition worsens.  Duration of encounter: 30mnutes.  Note by: BGillis Santa MD Date: 06/05/2021; Time: 2:41 PM

## 2021-06-29 ENCOUNTER — Other Ambulatory Visit: Payer: Self-pay | Admitting: Psychiatry

## 2021-06-29 DIAGNOSIS — F3342 Major depressive disorder, recurrent, in full remission: Secondary | ICD-10-CM

## 2021-07-09 ENCOUNTER — Ambulatory Visit
Payer: Medicare Other | Attending: Student in an Organized Health Care Education/Training Program | Admitting: Student in an Organized Health Care Education/Training Program

## 2021-07-09 ENCOUNTER — Other Ambulatory Visit: Payer: Self-pay

## 2021-07-09 ENCOUNTER — Encounter: Payer: Self-pay | Admitting: Student in an Organized Health Care Education/Training Program

## 2021-07-09 VITALS — BP 162/74 | HR 72 | Temp 96.6°F | Resp 18 | Ht 60.0 in | Wt 260.0 lb

## 2021-07-09 DIAGNOSIS — M5136 Other intervertebral disc degeneration, lumbar region: Secondary | ICD-10-CM | POA: Insufficient documentation

## 2021-07-09 DIAGNOSIS — M4807 Spinal stenosis, lumbosacral region: Secondary | ICD-10-CM | POA: Diagnosis present

## 2021-07-09 DIAGNOSIS — Z9689 Presence of other specified functional implants: Secondary | ICD-10-CM | POA: Insufficient documentation

## 2021-07-09 DIAGNOSIS — G894 Chronic pain syndrome: Secondary | ICD-10-CM | POA: Insufficient documentation

## 2021-07-09 DIAGNOSIS — M47812 Spondylosis without myelopathy or radiculopathy, cervical region: Secondary | ICD-10-CM | POA: Insufficient documentation

## 2021-07-09 DIAGNOSIS — M5416 Radiculopathy, lumbar region: Secondary | ICD-10-CM | POA: Insufficient documentation

## 2021-07-09 DIAGNOSIS — Z981 Arthrodesis status: Secondary | ICD-10-CM | POA: Insufficient documentation

## 2021-07-09 MED ORDER — OXYCODONE-ACETAMINOPHEN 10-325 MG PO TABS: 1.0000 | ORAL_TABLET | Freq: Three times a day (TID) | ORAL | 0 refills | Status: DC | PRN

## 2021-07-09 NOTE — Progress Notes (Signed)
Nursing Pain Medication Assessment:  ?Safety precautions to be maintained throughout the outpatient stay will include: orient to surroundings, keep bed in low position, maintain call bell within reach at all times, provide assistance with transfer out of bed and ambulation.  ?Medication Inspection Compliance: Pill count conducted under aseptic conditions, in front of the patient. Neither the pills nor the bottle was removed from the patient's sight at any time. Once count was completed pills were immediately returned to the patient in their original bottle. ? ?Medication:  Levorphanol ?Pill/Patch Count:  29 of 90 pills remain ?Pill/Patch Appearance: Markings consistent with prescribed medication ?Bottle Appearance: Standard pharmacy container. Clearly labeled. ?Filled Date: 02 / 21 / 2023 ?Last Medication intake:  Today ?

## 2021-07-09 NOTE — Progress Notes (Signed)
PROVIDER NOTE: Information contained herein reflects review and annotations entered in association with encounter. Interpretation of such information and data should be left to medically-trained personnel. Information provided to patient can be located elsewhere in the medical record under "Patient Instructions". Document created using STT-dictation technology, any transcriptional errors that may result from process are unintentional.  ?  ?Patient: Connie [name]  Service Category: E/M  Provider: [name], MD  ?DOB: [birth year]  DOS: 07/09/2021  Specialty: Interventional Pain Management  ?MRN: [id number]  Setting: Ambulatory outpatient  PCP: Tracie Harrier, MD  ?Type: Established Patient    Referring Provider: Tracie Harrier, MD  ?Location: Office  Delivery: Face-to-face    ? ?HPI  ?Connie [name], a 66 y.o. year old female, is here today because of her Lumbar radiculopathy [M54.16]. Connie [name] primary complain today is Back Pain (lower) ?Last encounter: My last encounter with her was on 06/05/2021. ?Pertinent problems: Connie [name] has Degenerative joint disease of cervical and lumbar spine; Lumbar spondylosis; History of lumbar fusion; Lumbar degenerative disc disease; Fibromyalgia; Chronic pain syndrome; Spinal cord stimulator status; Acquired spondylolisthesis; Chronic pain; Generalized osteoarthritis of multiple sites; Spinal stenosis of lumbar region without neurogenic claudication; and Lumbar post-laminectomy syndrome on their pertinent problem list. ?Pain Assessment: Severity of Chronic pain is reported as a 4 /10. Location: Back Lower/both legs, worse in left leg, to the foot. Onset: More than a month ago. Quality: Tingling, Numbness, Shooting. Timing: Intermittent. Modifying factor(s): injections, medications. ?Vitals:  height is 5' (1.524 m) and weight is 260 lb (117.9 kg). Her temporal temperature is 96.6 ?F (35.9 ?C) (abnormal). Her blood pressure is 162/74 (abnormal) and her pulse is 72.  Her respiration is 18 and oxygen saturation is 95%.  ? ?Reason for encounter: medication management.  She is also endorsing worsening low back and radiating leg pain with her left leg being worse than her right.  She is requesting a repeat caudal epidural steroid injection which she received significant benefit from that was done early January, approximately 80% pain relief for the first 6 weeks now with gradual return. ? ?Of note, patient states that her insurance is changing and that levorphanol would no longer be covered.  We will transition her to Percocet 10 mg every 8 hours as needed.  This is a dose reduction for her from an MME of 66 to a MME of 45.  We will see how she does with this. ? ?We will schedule her for a repeat caudal epidural injection anytime after April 4. ? ?Pharmacotherapy Assessment  ?Analgesic: Levorphanol 2 mg BID  prn, #60/month--> transition to Percocet 10 mg TID prn MME=45   ? ?Monitoring: ?[place] PMP: PDMP reviewed during this encounter.       ?Pharmacotherapy: No side-effects or adverse reactions reported. ?Compliance: No problems identified. ?Effectiveness: Clinically acceptable. ? ?Landis Martins, RN  07/09/2021  1:47 PM  Sign when Signing Visit ?Nursing Pain Medication Assessment:  ?Safety precautions to be maintained throughout the outpatient stay will include: orient to surroundings, keep bed in low position, maintain call bell within reach at all times, provide assistance with transfer out of bed and ambulation.  ?Medication Inspection Compliance: Pill count conducted under aseptic conditions, in front of the patient. Neither the pills nor the bottle was removed from the patient's sight at any time. Once count was completed pills were immediately returned to the patient in their original bottle. ? ?Medication:  Levorphanol ?Pill/Patch Count:  29 of 90 pills remain ?Pill/Patch Appearance: Markings  consistent with prescribed medication ?Bottle Appearance: Standard pharmacy container.  Clearly labeled. ?Filled Date: 02 / 21 / 2023 ?Last Medication intake:  Today ?  ?  UDS:  ?Summary  ?Date Value Ref Range Status  ?08/07/2020 Note  Final  ?  Comment:  ?  ==================================================================== ?[employer] Select 13 (MW) ?==================================================================== ?Test                             Result       Flag       Units ? ?  NO DRUGS DETECTED. ?==================================================================== ?Test                      Result    Flag   Units      Ref Range ?  Creatinine              131              mg/dL      >=20 ?==================================================================== ?Declared Medications: ? The flagging and interpretation on this report are based on the ? following declared medications.  Unexpected results may arise from ? inaccuracies in the declared medications. ? ? **Note: The testing scope of this panel does not include the ? following reported medications: ? ? Azelastine (Astelin) ? Bupropion (Wellbutrin) ? Celecoxib (Celebrex) ? Citalopram (Celexa) ? Cyanocobalamin ? Furosemide (Lasix) ? Hydroxyzine (Vistaril) ? Ibuprofen (Advil) ? Levorphanol ? Levothyroxine (Synthroid) ? Loratadine (Claritin) ? Methylphenidate (Ritalin) ? Metoprolol (Toprol) ? Ondansetron (Zofran) ? Pantoprazole (Protonix) ? Pregabalin (Lyrica) ? Trazodone (Desyrel) ?==================================================================== ?For clinical consultation, please call [phone]. ?==================================================================== ?  ?  ? ?ROS  ?Constitutional: Denies any fever or chills ?Gastrointestinal: No reported hemesis, hematochezia, vomiting, or acute GI distress ?Musculoskeletal:  Low back pain with radiation into bilateral legs in a dermatomal fashion, left greater than right ?Neurological: No reported episodes of acute onset apraxia, aphasia, dysarthria, agnosia, amnesia, paralysis, loss  of coordination, or loss of consciousness ? ?Medication Review  ?azelastine, buPROPion, celecoxib, hydrOXYzine, levorphanol, levothyroxine, metoprolol succinate, oxyCODONE-acetaminophen, pregabalin, traZODone, and vitamin B-12 ? ?History Review  ?Allergy: Ms. Archibald has No Known Allergies. ?Drug: Ms. Kneeland  reports no history of drug use. ?Alcohol:  reports no history of alcohol use. ?Tobacco:  reports that she quit smoking about 14 years ago. Her smoking use included cigarettes. She has a 10.00 pack-year smoking history. She has never used smokeless tobacco. ?Social: Ms. Idrovo  reports that she quit smoking about 14 years ago. Her smoking use included cigarettes. She has a 10.00 pack-year smoking history. She has never used smokeless tobacco. She reports that she does not drink alcohol and does not use drugs. ?Medical:  has a past medical history of Anemia, Anxiety, Arthritis, Cervical spondylosis with myelopathy (2018), Chronic pain (2019), DDD (degenerative disc disease), lumbar, Depression, Displacement of cervical intervertebral disc (2018), Dyspnea, Fibromyalgia, GERD (gastroesophageal reflux disease), Heart murmur, Hypertension, Hypothyroidism, Lumbar post-laminectomy syndrome (2018), Lumbosacral radiculitis (2018), Neuropathy due to medical condition ([place]), Osteoarthritis, Osteoporosis, Other long term (current) drug therapy (2019), Pain in the coccyx (2018), Spondylolisthesis (2018), Spondylosis of lumbosacral region without myelopathy or radiculopathy (2018), Spondylosis with myelopathy, lumbar region (2018), and Vitamin D deficiency. ?Surgical: Ms. Minarik  has a past surgical history that includes Cesarean section; Gastric bypass (2004); Lumbar fusion (2016); Spinal cord stimulator implant (2018); Cholecystectomy; Appendectomy; Tonsillectomy; Shoulder arthroscopy with rotator cuff repair (Right, 2008); Joint replacement (Bilateral, 2005); Abdominal hysterectomy (2000);  epidural injection (2018); arm  surgery (2002); Breast biopsy (Left, 2005); Colonoscopy with propofol (N/A, 08/25/2017); Temporal artery biopsy / ligation; and Cataract extraction w/PHACO (Right, 10/21/2017). ?Family: family history incl

## 2021-07-09 NOTE — Patient Instructions (Signed)
GENERAL RISKS AND COMPLICATIONS ? ?What are the risk, side effects and possible complications? ?Generally speaking, most procedures are safe.  However, with any procedure there are risks, side effects, and the possibility of complications.  The risks and complications are dependent upon the sites that are lesioned, or the type of nerve block to be performed.  The closer the procedure is to the spine, the more serious the risks are.  Great care is taken when placing the radio frequency needles, block needles or lesioning probes, but sometimes complications can occur. ?Infection: Any time there is an injection through the skin, there is a risk of infection.  This is why sterile conditions are used for these blocks.  There are four possible types of infection. ?Localized skin infection. ?Central Nervous System Infection-This can be in the form of Meningitis, which can be deadly. ?Epidural Infections-This can be in the form of an epidural abscess, which can cause pressure inside of the spine, causing compression of the spinal cord with subsequent paralysis. This would require an emergency surgery to decompress, and there are no guarantees that the patient would recover from the paralysis. ?Discitis-This is an infection of the intervertebral discs.  It occurs in about 1% of discography procedures.  It is difficult to treat and it may lead to surgery. ? ?      2. Pain: the needles have to go through skin and soft tissues, will cause soreness. ?      3. Damage to internal structures:  The nerves to be lesioned may be near blood vessels or   ? other nerves which can be potentially damaged. ?      4. Bleeding: Bleeding is more common if the patient is taking blood thinners such as  aspirin, Coumadin, Ticiid, Plavix, etc., or if he/she have some genetic predisposition  such as hemophilia. Bleeding into the spinal canal can cause compression of the spinal  cord with subsequent paralysis.  This would require an emergency  surgery to  decompress and there are no guarantees that the patient would recover from the  paralysis. ?      5. Pneumothorax:  Puncturing of a lung is a possibility, every time a needle is introduced in  the area of the chest or upper back.  Pneumothorax refers to free air around the  collapsed lung(s), inside of the thoracic cavity (chest cavity).  Another two possible  complications related to a similar event would include: Hemothorax and Chylothorax.   These are variations of the Pneumothorax, where instead of air around the collapsed  lung(s), you may have blood or chyle, respectively. ?      6. Spinal headaches: They may occur with any procedures in the area of the spine. ?      7. Persistent CSF (Cerebro-Spinal Fluid) leakage: This is a rare problem, but may occur  with prolonged intrathecal or epidural catheters either due to the formation of a fistulous  track or a dural tear. ?      8. Nerve damage: By working so close to the spinal cord, there is always a possibility of  nerve damage, which could be as serious as a permanent spinal cord injury with  paralysis. ?      9. Death:  Although rare, severe deadly allergic reactions known as "Anaphylactic  reaction" can occur to any of the medications used. ?     10. Worsening of the symptoms:  We can always make thing worse. ? ?What are the chances  of something like this happening? ?Chances of any of this occuring are extremely low.  By statistics, you have more of a chance of getting killed in a motor vehicle accident: while driving to the hospital than any of the above occurring .  Nevertheless, you should be aware that they are possibilities.  In general, it is similar to taking a shower.  Everybody knows that you can slip, hit your head and get killed.  Does that mean that you should not shower again?  Nevertheless always keep in mind that statistics do not mean anything if you happen to be on the wrong side of them.  Even if a procedure has a 1 (one) in a  1,000,000 (million) chance of going wrong, it you happen to be that one..Also, keep in mind that by statistics, you have more of a chance of having something go wrong when taking medications. ? ?Who should not have this procedure? ?If you are on a blood thinning medication (e.g. Coumadin, Plavix, see list of "Blood Thinners"), or if you have an active infection going on, you should not have the procedure.  If you are taking any blood thinners, please inform your physician. ? ?How should I prepare for this procedure? ?Do not eat or drink anything at least six hours prior to the procedure. ?Bring a driver with you .  It cannot be a taxi. ?Come accompanied by an adult that can drive you back, and that is strong enough to help you if your legs get weak or numb from the local anesthetic. ?Take all of your medicines the morning of the procedure with just enough water to swallow them. ?If you have diabetes, make sure that you are scheduled to have your procedure done first thing in the morning, whenever possible. ?If you have diabetes, take only half of your insulin dose and notify our nurse that you have done so as soon as you arrive at the clinic. ?If you are diabetic, but only take blood sugar pills (oral hypoglycemic), then do not take them on the morning of your procedure.  You may take them after you have had the procedure. ?Do not take aspirin or any aspirin-containing medications, at least eleven (11) days prior to the procedure.  They may prolong bleeding. ?Wear loose fitting clothing that may be easy to take off and that you would not mind if it got stained with Betadine or blood. ?Do not wear any jewelry or perfume ?Remove any nail coloring.  It will interfere with some of our monitoring equipment. ? ?NOTE: Remember that this is not meant to be interpreted as a complete list of all possible complications.  Unforeseen problems may occur. ? ?BLOOD THINNERS ?The following drugs contain aspirin or other products,  which can cause increased bleeding during surgery and should not be taken for 2 weeks prior to and 1 week after surgery.  If you should need take something for relief of minor pain, you may take acetaminophen which is found in Tylenol,m Datril, Anacin-3 and Panadol. It is not blood thinner. The products listed below are.  Do not take any of the products listed below in addition to any listed on your instruction sheet. ? ?A.P.C or A.P.C with Codeine Codeine Phosphate Capsules #3 Ibuprofen Ridaura  ?ABC compound Congesprin Imuran rimadil  ?Advil Cope Indocin Robaxisal  ?Alka-Seltzer Effervescent Pain Reliever and Antacid Coricidin or Coricidin-D ? Indomethacin Rufen  ?Alka-Seltzer plus Cold Medicine Cosprin Ketoprofen S-A-C Tablets  ?Anacin Analgesic Tablets or Capsules Coumadin  Korlgesic Salflex  ?Anacin Extra Strength Analgesic tablets or capsules CP-2 Tablets Lanoril Salicylate  ?Anaprox Cuprimine Capsules Levenox Salocol  ?Anexsia-D Dalteparin Magan Salsalate  ?Anodynos Darvon compound Magnesium Salicylate Sine-off  ?Ansaid Dasin Capsules Magsal Sodium Salicylate  ?Anturane Depen Capsules Marnal Soma  ?APF Arthritis pain formula Dewitt's Pills Measurin Stanback  ?Argesic Dia-Gesic Meclofenamic Sulfinpyrazone  ?Arthritis Bayer Timed Release Aspirin Diclofenac Meclomen Sulindac  ?Arthritis pain formula Anacin Dicumarol Medipren Supac  ?Analgesic (Safety coated) Arthralgen Diffunasal Mefanamic Suprofen  ?Arthritis Strength Bufferin Dihydrocodeine Mepro Compound Suprol  ?Arthropan liquid Dopirydamole Methcarbomol with Aspirin Synalgos  ?ASA tablets/Enseals Disalcid Micrainin Tagament  ?Ascriptin Doan's Midol Talwin  ?Ascriptin A/D Dolene Mobidin Tanderil  ?Ascriptin Extra Strength Dolobid Moblgesic Ticlid  ?Ascriptin with Codeine Doloprin or Doloprin with Codeine Momentum Tolectin  ?Asperbuf Duoprin Mono-gesic Trendar  ?Aspergum Duradyne Motrin or Motrin IB Triminicin  ?Aspirin plain, buffered or enteric coated  Durasal Myochrisine Trigesic  ?Aspirin Suppositories Easprin Nalfon Trillsate  ?Aspirin with Codeine Ecotrin Regular or Extra Strength Naprosyn Uracel  ?Atromid-S Efficin Naproxen Ursinus  ?Auranofin Capsules Elmiro

## 2021-07-29 ENCOUNTER — Ambulatory Visit
Admission: RE | Admit: 2021-07-29 | Discharge: 2021-07-29 | Disposition: A | Discharge status: Home / Self Care | Payer: Medicare Other | Source: Ambulatory Visit | Attending: Student in an Organized Health Care Education/Training Program | Admitting: Student in an Organized Health Care Education/Training Program

## 2021-07-29 ENCOUNTER — Encounter: Payer: Self-pay | Admitting: Student in an Organized Health Care Education/Training Program

## 2021-07-29 ENCOUNTER — Ambulatory Visit (HOSPITAL_BASED_OUTPATIENT_CLINIC_OR_DEPARTMENT_OTHER): Payer: Medicare Other | Admitting: Student in an Organized Health Care Education/Training Program

## 2021-07-29 VITALS — BP 112/60 | HR 64 | Temp 97.2°F | Resp 10 | Ht 60.0 in | Wt 260.0 lb

## 2021-07-29 DIAGNOSIS — Z981 Arthrodesis status: Secondary | ICD-10-CM | POA: Diagnosis present

## 2021-07-29 DIAGNOSIS — G894 Chronic pain syndrome: Secondary | ICD-10-CM | POA: Diagnosis not present

## 2021-07-29 DIAGNOSIS — M4807 Spinal stenosis, lumbosacral region: Secondary | ICD-10-CM

## 2021-07-29 DIAGNOSIS — M5416 Radiculopathy, lumbar region: Secondary | ICD-10-CM | POA: Insufficient documentation

## 2021-07-29 MED ORDER — OXYCODONE-ACETAMINOPHEN 10-325 MG PO TABS: 1.0000 | ORAL_TABLET | Freq: Three times a day (TID) | ORAL | 0 refills | Status: AC | PRN

## 2021-07-29 MED ORDER — DEXAMETHASONE SODIUM PHOSPHATE 10 MG/ML IJ SOLN
INTRAMUSCULAR | Status: AC
  Filled 2021-07-29: qty 1

## 2021-07-29 MED ORDER — ROPIVACAINE HCL 2 MG/ML IJ SOLN
INTRAMUSCULAR | Status: AC
  Filled 2021-07-29: qty 20

## 2021-07-29 MED ORDER — LIDOCAINE HCL 2 % IJ SOLN
INTRAMUSCULAR | Status: AC
  Filled 2021-07-29: qty 20

## 2021-07-29 MED ORDER — IOHEXOL 180 MG/ML  SOLN
10.0000 mL | Freq: Once | INTRAMUSCULAR | Status: AC
  Administered 2021-07-29: 10 mL via EPIDURAL

## 2021-07-29 MED ORDER — DEXAMETHASONE SODIUM PHOSPHATE 10 MG/ML IJ SOLN
10.0000 mg | Freq: Once | INTRAMUSCULAR | Status: AC
  Administered 2021-07-29: 10 mg

## 2021-07-29 MED ORDER — IOHEXOL 180 MG/ML  SOLN
INTRAMUSCULAR | Status: AC
Start: 2021-07-29 — End: ?
  Filled 2021-07-29: qty 20

## 2021-07-29 MED ORDER — ROPIVACAINE HCL 2 MG/ML IJ SOLN
2.0000 mL | Freq: Once | INTRAMUSCULAR | Status: AC
  Administered 2021-07-29: 2 mL via EPIDURAL

## 2021-07-29 MED ORDER — SODIUM CHLORIDE 0.9% FLUSH
2.0000 mL | Freq: Once | INTRAVENOUS | Status: AC
  Administered 2021-07-29: 2 mL

## 2021-07-29 MED ORDER — SODIUM CHLORIDE (PF) 0.9 % IJ SOLN
INTRAMUSCULAR | Status: AC
  Filled 2021-07-29: qty 10

## 2021-07-29 MED ORDER — LIDOCAINE HCL 2 % IJ SOLN
20.0000 mL | Freq: Once | INTRAMUSCULAR | Status: AC
  Administered 2021-07-29: 400 mg

## 2021-07-29 NOTE — Progress Notes (Signed)
PROVIDER NOTE: Interpretation of information contained herein should be left to medically-trained personnel. Specific patient instructions are provided elsewhere under "Patient Instructions" section of medical record. This document was created in part using STT-dictation technology, any transcriptional errors that may result from this process are unintentional.  ?Patient: Connie [name] ?Type: Established ?DOB: 1956-03-10 ?MRN: [id number] ?PCP: Tracie Harrier, MD  Service: Procedure ?DOS: 07/29/2021 ?Setting: Ambulatory ?Location: Ambulatory outpatient facility ?Delivery: Face-to-face Provider: [name], MD ?Specialty: Interventional Pain Management ?Specialty designation: 09 ?Location: Outpatient facility ?Ref. Prov.: Tracie Harrier, MD   ? ?Primary Reason for Visit: Interventional Pain Management Treatment. ?CC: Back Pain ? ?  ?Procedure:          Anesthesia, Analgesia, Anxiolysis:  ?Type: Therapeutic Epidural Steroid Injection #2 (#1 done 05/01/21) ?Region: Caudal ?Level: Sacrococcygeal   ?Laterality: Midline       Anesthesia: Local (1-2% Lidocaine)  ?Anxiolysis: None  ?Sedation: None  ?Guidance: Fluoroscopy         ? ? ?Position: Prone  ? ?1. Lumbar radiculopathy   ?2. Spinal stenosis of lumbosacral region   ?3. History of lumbar fusion   ?4. Chronic pain syndrome   ? ?NAS-11 Pain score:  ? Pre-procedure: 6 /10  ? Post-procedure: 6 /10  ? ?  ?Pre-op H&P Assessment:  ?Connie [name] is a 66 y.o. (year old), female patient, seen today for interventional treatment. She  has a past surgical history that includes Cesarean section; Gastric bypass (2004); Lumbar fusion (2016); Spinal cord stimulator implant (2018); Cholecystectomy; Appendectomy; Tonsillectomy; Shoulder arthroscopy with rotator cuff repair (Right, 2008); Joint replacement (Bilateral, 2005); Abdominal hysterectomy (2000); epidural injection (2018); arm surgery (2002); Breast biopsy (Left, 2005); Colonoscopy with propofol (N/A, 08/25/2017); Temporal  artery biopsy / ligation; and Cataract extraction w/PHACO (Right, 10/21/2017). Connie [name] has a current medication list which includes the following prescription(s): azelastine, bupropion, celecoxib, levothyroxine, metoprolol succinate, pregabalin, trazodone, vitamin b-12, and [START ON 08/09/2021] oxycodone-acetaminophen. Her primarily concern today is the Back Pain ? ?Initial Vital Signs:  ?Pulse/HCG Rate: 64ECG Heart Rate: 61 ?Temp: (!) 97.2 ?F (36.2 ?C) ?Resp: 16 ?BP:  ?139/65 ?SpO2: 95 % ? ?BMI: Estimated body mass index is 50.78 kg/m? as calculated from the following: ?  Height as of this encounter: 5' (1.524 m). ?  Weight as of this encounter: 260 lb (117.9 kg). ? ?Risk Assessment: ?Allergies: Reviewed. She has No Known Allergies.  ?Allergy Precautions: None required ?Coagulopathies: Reviewed. None identified.  ?Blood-thinner therapy: None at this time ?Active Infection(s): Reviewed. None identified. Connie [name] is afebrile ? ?Site Confirmation: Connie [name] was asked to confirm the procedure and laterality before marking the site ?Procedure checklist: Completed ?Consent: Before the procedure and under the influence of no sedative(s), amnesic(s), or anxiolytics, the patient was informed of the treatment options, risks and possible complications. To fulfill our ethical and legal obligations, as recommended by the American Medical Association's Code of Ethics, I have informed the patient of my clinical impression; the nature and purpose of the treatment or procedure; the risks, benefits, and possible complications of the intervention; the alternatives, including doing nothing; the risk(s) and benefit(s) of the alternative treatment(s) or procedure(s); and the risk(s) and benefit(s) of doing nothing. ?The patient was provided information about the general risks and possible complications associated with the procedure. These may include, but are not limited to: failure to achieve desired goals, infection, bleeding,  organ or nerve damage, allergic reactions, paralysis, and death. ?In addition, the patient was informed of those risks and complications associated  to Spine-related procedures, such as failure to decrease pain; infection (i.e.: Meningitis, epidural or intraspinal abscess); bleeding (i.e.: epidural hematoma, subarachnoid hemorrhage, or any other type of intraspinal or peri-dural bleeding); organ or nerve damage (i.e.: Any type of peripheral nerve, nerve root, or spinal cord injury) with subsequent damage to sensory, motor, and/or autonomic systems, resulting in permanent pain, numbness, and/or weakness of one or several areas of the body; allergic reactions; (i.e.: anaphylactic reaction); and/or death. ?Furthermore, the patient was informed of those risks and complications associated with the medications. These include, but are not limited to: allergic reactions (i.e.: anaphylactic or anaphylactoid reaction(s)); adrenal axis suppression; blood sugar elevation that in diabetics may result in ketoacidosis or comma; water retention that in patients with history of congestive heart failure may result in shortness of breath, pulmonary edema, and decompensation with resultant heart failure; weight gain; swelling or edema; medication-induced neural toxicity; particulate matter embolism and blood vessel occlusion with resultant organ, and/or nervous system infarction; and/or aseptic necrosis of one or more joints. ?Finally, the patient was informed that Medicine is not an exact science; therefore, there is also the possibility of unforeseen or unpredictable risks and/or possible complications that may result in a catastrophic outcome. The patient indicated having understood very clearly. We have given the patient no guarantees and we have made no promises. Enough time was given to the patient to ask questions, all of which were answered to the patient's satisfaction. Connie [name] has indicated that she wanted to continue with  the procedure. ?Attestation: I, the ordering provider, attest that I have discussed with the patient the benefits, risks, side-effects, alternatives, likelihood of achieving goals, and potential problems during recovery for the procedure that I have provided informed consent. ?Date  Time: 07/29/2021  9:37 AM ? ?Pre-Procedure Preparation:  ?Monitoring: As per clinic protocol. Respiration, ETCO2, SpO2, BP, heart rate and rhythm monitor placed and checked for adequate function ?Safety Precautions: Patient was assessed for positional comfort and pressure points before starting the procedure. ?Time-out: I initiated and conducted the "Time-out" before starting the procedure, as per protocol. The patient was asked to participate by confirming the accuracy of the "Time Out" information. Verification of the correct person, site, and procedure were performed and confirmed by me, the nursing staff, and the patient. "Time-out" conducted as per Joint Commission's Universal Protocol ([email or web address].01.01.01). ?Time: 1008 ? ?Description of Procedure:          ?Target Area: Caudal Epidural Canal. ?Approach: Midline approach. ?Area Prepped: Entire Posterior Sacrococcygeal Region ?DuraPrep (Iodine Povacrylex [0.7% available iodine] and Isopropyl Alcohol, 74% w/w) ?Safety Precautions: Aspiration looking for blood return was conducted prior to all injections. At no point did we inject any substances, as a needle was being advanced. No attempts were made at seeking any paresthesias. Safe injection practices and needle disposal techniques used. Medications properly checked for expiration dates. SDV (single dose vial) medications used. ?Description of the Procedure: Protocol guidelines were followed. The patient was placed in position over the fluoroscopy table. The target area was identified and the area prepped in the usual manner. Skin & deeper tissues infiltrated with local anesthetic. Appropriate amount of time allowed to pass for local  anesthetics to take effect. The procedure needles were then advanced to the target area. Proper needle placement secured. Negative aspiration confirmed. Solution injected in intermittent fashion, asking for systemic

## 2021-07-29 NOTE — Progress Notes (Signed)
Safety precautions to be maintained throughout the outpatient stay will include: orient to surroundings, keep bed in low position, maintain call bell within reach at all times, provide assistance with transfer out of bed and ambulation.  

## 2021-07-29 NOTE — Patient Instructions (Signed)

## 2021-07-30 ENCOUNTER — Telehealth: Payer: Self-pay

## 2021-07-30 NOTE — Telephone Encounter (Signed)
Post procedure phone call.  Patient states she is doing well.  

## 2021-08-01 ENCOUNTER — Other Ambulatory Visit: Payer: Self-pay | Admitting: Internal Medicine

## 2021-08-01 ENCOUNTER — Other Ambulatory Visit: Payer: Self-pay | Admitting: Family Medicine

## 2021-08-01 DIAGNOSIS — Z1231 Encounter for screening mammogram for malignant neoplasm of breast: Secondary | ICD-10-CM

## 2021-08-06 DIAGNOSIS — G253 Myoclonus: Secondary | ICD-10-CM | POA: Insufficient documentation

## 2021-08-27 ENCOUNTER — Encounter: Payer: Self-pay | Admitting: Student in an Organized Health Care Education/Training Program

## 2021-08-27 ENCOUNTER — Ambulatory Visit
Payer: Medicare Other | Attending: Student in an Organized Health Care Education/Training Program | Admitting: Student in an Organized Health Care Education/Training Program

## 2021-08-27 ENCOUNTER — Other Ambulatory Visit: Payer: Self-pay

## 2021-08-27 VITALS — BP 187/86 | HR 65 | Temp 97.2°F | Resp 18 | Ht 60.0 in | Wt 260.0 lb

## 2021-08-27 DIAGNOSIS — M5416 Radiculopathy, lumbar region: Secondary | ICD-10-CM | POA: Diagnosis not present

## 2021-08-27 DIAGNOSIS — M5136 Other intervertebral disc degeneration, lumbar region: Secondary | ICD-10-CM | POA: Diagnosis not present

## 2021-08-27 DIAGNOSIS — M51369 Other intervertebral disc degeneration, lumbar region without mention of lumbar back pain or lower extremity pain: Secondary | ICD-10-CM

## 2021-08-27 DIAGNOSIS — Z981 Arthrodesis status: Secondary | ICD-10-CM | POA: Diagnosis not present

## 2021-08-27 DIAGNOSIS — M47812 Spondylosis without myelopathy or radiculopathy, cervical region: Secondary | ICD-10-CM

## 2021-08-27 DIAGNOSIS — R197 Diarrhea, unspecified: Secondary | ICD-10-CM | POA: Insufficient documentation

## 2021-08-27 DIAGNOSIS — R143 Flatulence: Secondary | ICD-10-CM | POA: Insufficient documentation

## 2021-08-27 DIAGNOSIS — M4807 Spinal stenosis, lumbosacral region: Secondary | ICD-10-CM

## 2021-08-27 DIAGNOSIS — G894 Chronic pain syndrome: Secondary | ICD-10-CM | POA: Diagnosis not present

## 2021-08-27 DIAGNOSIS — K909 Intestinal malabsorption, unspecified: Secondary | ICD-10-CM | POA: Insufficient documentation

## 2021-08-27 MED ORDER — HYDROMORPHONE HCL 2 MG PO TABS: 2.0000 mg | ORAL_TABLET | Freq: Three times a day (TID) | ORAL | 0 refills | Status: AC | PRN

## 2021-08-27 NOTE — Progress Notes (Signed)
PROVIDER NOTE: Information contained herein reflects review and annotations entered in association with encounter. Interpretation of such information and data should be left to medically-trained personnel. Information provided to patient can be located elsewhere in the medical record under "Patient Instructions". Document created using STT-dictation technology, any transcriptional errors that may result from process are unintentional.  ?  ?Patient: Connie [name]  Service Category: E/M  Provider: [name], MD  ?DOB: [name] 26, 1956  DOS: 08/27/2021  Specialty: Interventional Pain Management  ?MRN: [id number]  Setting: Ambulatory outpatient  PCP: Tracie Harrier, MD  ?Type: Established Patient    Referring Provider: Tracie Harrier, MD  ?Location: Office  Delivery: Face-to-face    ? ?HPI  ?[name], a 66 y.o. year old female, is here today because of her Lumbar radiculopathy [M54.16]. [name] primary complain today is Back Pain (low) ?Last encounter: My last encounter with her was on 07/09/21 ?Pertinent problems: [name] has Degenerative joint disease of cervical and lumbar spine; Lumbar spondylosis; History of lumbar fusion; Lumbar degenerative disc disease; Fibromyalgia; Chronic pain syndrome; Spinal cord stimulator status; Acquired spondylolisthesis; Chronic pain; Generalized osteoarthritis of multiple sites; Spinal stenosis of lumbar region without neurogenic claudication; and Lumbar post-laminectomy syndrome on their pertinent problem list. ?Pain Assessment: Severity of Chronic pain is reported as a 8 /10. Location: Back Lower/left leg. Onset: More than a month ago. Quality: Cramping, Burning, Constant. Timing: Constant. Modifying factor(s): medications, rest, lying flat. ?Vitals:  height is 5' (1.524 m) and weight is 260 lb (117.9 kg). Her temporal temperature is 97.2 ?F (36.2 ?C) (abnormal). Her blood pressure is 187/86 (abnormal) and her pulse is 65. Her respiration is 18 and oxygen  saturation is 98%.  ? ?Reason for encounter: medication management.   ? ?Patient presents today for medication management.  She states that she is having a hard time managing her pain on oxycodone.  She is wondering if she should go back on levorphanol which she was on previously.  The only drawback to this medication was cost which the patient was spending [phone] hours a month for it.  She states that she would rather pay for the levorphanol then be in pain and not have any benefit with oxycodone.  We did discuss a trial of hydromorphone at a low dose, 2 mg every 8 hours as needed.  Risks and benefits were reviewed.  If this is effective for the patient, we can continue with this and it will be more cost effective for the patient as well.  If this is not effective, we can transition back to levorphanol.  Patient will follow back up with me in 4 weeks.  We will also renew our annual urine toxicology screen today which should be positive for oxycodone and its metabolites. ? ?Pharmacotherapy Assessment  ?Analgesic: Levorphanol 2 mg BID  prn, #60/month--> transitioned to Percocet 10 mg TID prn MME=45--> not effective so trial of hydromorphone 2 mg q8 hrs prn    ? ?Monitoring: ?[place] PMP: PDMP reviewed during this encounter.       ?Pharmacotherapy: No side-effects or adverse reactions reported. ?Compliance: No problems identified. ?Effectiveness: Clinically acceptable. ? ?Hart Rochester, RN  08/27/2021  1:02 PM  Sign when Signing Visit ?Nursing Pain Medication Assessment:  ?Safety precautions to be maintained throughout the outpatient stay will include: orient to surroundings, keep bed in low position, maintain call bell within reach at all times, provide assistance with transfer out of bed and ambulation.  ?Medication Inspection Compliance: Pill count conducted  under aseptic conditions, in front of the patient. Neither the pills nor the bottle was removed from the patient's sight at any time. Once count was  completed pills were immediately returned to the patient in their original bottle. ? ?Medication: Oxycodone IR ?Pill/Patch Count:  35 of 90 pills remain ?Pill/Patch Appearance: Markings consistent with prescribed medication ?Bottle Appearance: Standard pharmacy container. Clearly labeled. ?Filled Date: 04/ / 15 / 2023 ?Last Medication intake:  Today ?  UDS:  ?Summary  ?Date Value Ref Range Status  ?08/07/2020 Note  Final  ?  Comment:  ?  ==================================================================== ?[employer]ox[employer] Select 13 (MW) ?==================================================================== ?Test                             Result       Flag       Units ? ?  NO DRUGS DETECTED. ?==================================================================== ?Test                      Result    Flag   Units      Ref Range ?  Creatinine              131              mg/dL      >=20 ?==================================================================== ?Declared Medications: ? The flagging and interpretation on this report are based on the ? following declared medications.  Unexpected results may arise from ? inaccuracies in the declared medications. ? ? **Note: The testing scope of this panel does not include the ? following reported medications: ? ? Azelastine (Astelin) ? Bupropion (Wellbutrin) ? Celecoxib (Celebrex) ? Citalopram (Celexa) ? Cyanocobalamin ? Furosemide (Lasix) ? Hydroxyzine (Vistaril) ? Ibuprofen (Advil) ? Levorphanol ? Levothyroxine (Synthroid) ? Loratadine (Claritin) ? Methylphenidate (Ritalin) ? Metoprolol (Toprol) ? Ondansetron (Zofran) ? Pantoprazole (Protonix) ? Pregabalin (Lyrica) ? Trazodone (Desyrel) ?==================================================================== ?For clinical consultation, please call [phone]. ?==================================================================== ?  ?  ? ?ROS  ?Constitutional: Denies any fever or chills ?Gastrointestinal: No reported hemesis,  hematochezia, vomiting, or acute GI distress ?Musculoskeletal:  Low back pain with radiation into bilateral legs in a dermatomal fashion, left greater than right ?Neurological: No reported episodes of acute onset apraxia, aphasia, dysarthria, agnosia, amnesia, paralysis, loss of coordination, or loss of consciousness ? ?Medication Review  ?HYDROmorphone, azelastine, buPROPion, celecoxib, levothyroxine, metoprolol succinate, oxyCODONE-acetaminophen, pregabalin, traZODone, and vitamin B-12 ? ?History Review  ?Allergy: [name] has No Known Allergies. ?Drug: [name]  reports no history of drug use. ?Alcohol:  reports no history of alcohol use. ?Tobacco:  reports that she quit smoking about 14 years ago. Her smoking use included cigarettes. She has a 10.00 pack-year smoking history. She has never used smokeless tobacco. ?Social: [name]  reports that she quit smoking about 14 years ago. Her smoking use included cigarettes. She has a 10.00 pack-year smoking history. She has never used smokeless tobacco. She reports that she does not drink alcohol and does not use drugs. ?Medical:  has a past medical history of Anemia, Anxiety, Arthritis, Cervical spondylosis with myelopathy (2018), Chronic pain (2019), DDD (degenerative disc disease), lumbar, Depression, Displacement of cervical intervertebral disc (2018), Dyspnea, Fibromyalgia, GERD (gastroesophageal reflux disease), Heart murmur, Hypertension, Hypothyroidism, Lumbar post-laminectomy syndrome (2018), Lumbosacral radiculitis (2018), Neuropathy due to medical condition ([place]), Osteoarthritis, Osteoporosis, Other long term (current) drug therapy (2019), Pain in the coccyx (2018), Spondylolisthesis (2018), Spondylosis of lumbosacral region without myelopathy or radiculopathy (2018), Spondylosis with  myelopathy, lumbar region (2018), and Vitamin D deficiency. ?Surgical: [name]  has a past surgical history that includes Cesarean section; Gastric bypass (2004);  Lumbar fusion (2016); Spinal cord stimulator implant (2018); Cholecystectomy; Appendectomy; Tonsillectomy; Shoulder arthroscopy with rotator cuff repair (Right, 2008); Joint replacement (Bilateral, 2005); Abdominal hysterec

## 2021-08-27 NOTE — Progress Notes (Signed)
Nursing Pain Medication Assessment:  ?Safety precautions to be maintained throughout the outpatient stay will include: orient to surroundings, keep bed in low position, maintain call bell within reach at all times, provide assistance with transfer out of bed and ambulation.  ?Medication Inspection Compliance: Pill count conducted under aseptic conditions, in front of the patient. Neither the pills nor the bottle was removed from the patient's sight at any time. Once count was completed pills were immediately returned to the patient in their original bottle. ? ?Medication: Oxycodone IR ?Pill/Patch Count:  35 of 90 pills remain ?Pill/Patch Appearance: Markings consistent with prescribed medication ?Bottle Appearance: Standard pharmacy container. Clearly labeled. ?Filled Date: 04/ / 15 / 2023 ?Last Medication intake:  Today ?

## 2021-09-04 LAB — TOXASSURE SELECT 13 (MW), URINE

## 2021-09-05 ENCOUNTER — Ambulatory Visit
Admission: RE | Admit: 2021-09-05 | Discharge: 2021-09-05 | Disposition: A | Discharge status: Home / Self Care | Payer: Medicare Other | Source: Ambulatory Visit | Attending: Family Medicine | Admitting: Family Medicine

## 2021-09-05 DIAGNOSIS — Z1231 Encounter for screening mammogram for malignant neoplasm of breast: Secondary | ICD-10-CM | POA: Insufficient documentation

## 2021-09-11 ENCOUNTER — Encounter: Payer: Self-pay | Admitting: Student in an Organized Health Care Education/Training Program

## 2021-09-11 DIAGNOSIS — M5136 Other intervertebral disc degeneration, lumbar region: Secondary | ICD-10-CM

## 2021-09-11 DIAGNOSIS — M5416 Radiculopathy, lumbar region: Secondary | ICD-10-CM

## 2021-09-11 DIAGNOSIS — Z981 Arthrodesis status: Secondary | ICD-10-CM

## 2021-09-11 DIAGNOSIS — M4807 Spinal stenosis, lumbosacral region: Secondary | ICD-10-CM

## 2021-09-11 MED ORDER — LEVORPHANOL TARTRATE 2 MG PO TABS: 2.0000 mg | ORAL_TABLET | Freq: Three times a day (TID) | ORAL | 0 refills | Status: DC | PRN

## 2021-09-11 NOTE — Telephone Encounter (Signed)
Changed Pharmacy in [place] to [place], [place] as requested. Can you do this and I will call the patient back. ?

## 2021-09-25 ENCOUNTER — Encounter: Payer: Self-pay | Admitting: Student in an Organized Health Care Education/Training Program

## 2021-09-25 ENCOUNTER — Ambulatory Visit
Payer: Medicare Other | Attending: Student in an Organized Health Care Education/Training Program | Admitting: Student in an Organized Health Care Education/Training Program

## 2021-09-25 VITALS — BP 141/54 | HR 63 | Temp 97.2°F | Resp 16 | Ht 60.0 in | Wt 270.0 lb

## 2021-09-25 DIAGNOSIS — G894 Chronic pain syndrome: Secondary | ICD-10-CM | POA: Insufficient documentation

## 2021-09-25 DIAGNOSIS — Z981 Arthrodesis status: Secondary | ICD-10-CM | POA: Diagnosis present

## 2021-09-25 DIAGNOSIS — M47812 Spondylosis without myelopathy or radiculopathy, cervical region: Secondary | ICD-10-CM | POA: Insufficient documentation

## 2021-09-25 DIAGNOSIS — M5136 Other intervertebral disc degeneration, lumbar region: Secondary | ICD-10-CM | POA: Diagnosis not present

## 2021-09-25 DIAGNOSIS — M5416 Radiculopathy, lumbar region: Secondary | ICD-10-CM | POA: Insufficient documentation

## 2021-09-25 DIAGNOSIS — M4807 Spinal stenosis, lumbosacral region: Secondary | ICD-10-CM | POA: Diagnosis not present

## 2021-09-25 MED ORDER — LEVORPHANOL TARTRATE 2 MG PO TABS: 2.0000 mg | ORAL_TABLET | Freq: Three times a day (TID) | ORAL | 0 refills | Status: DC | PRN

## 2021-09-25 MED ORDER — LEVORPHANOL TARTRATE 2 MG PO TABS: 2.0000 mg | ORAL_TABLET | Freq: Three times a day (TID) | ORAL | 0 refills | Status: AC | PRN

## 2021-09-25 NOTE — Progress Notes (Signed)
PROVIDER NOTE: Information contained herein reflects review and annotations entered in association with encounter. Interpretation of such information and data should be left to medically-trained personnel. Information provided to patient can be located elsewhere in the medical record under "Patient Instructions". Document created using STT-dictation technology, any transcriptional errors that may result from process are unintentional.    Patient: Connie [name]  Service Category: E/M  Provider:  , MD  DOB: 12/11/1955  DOS: 09/25/2021  Specialty: Interventional Pain Management  MRN: [id number]  Setting: Ambulatory outpatient  PCP: Hande, Vishwanath, MD  Type: Established Patient    Referring Provider: Hande, Vishwanath, MD  Location: Office  Delivery: Face-to-face     HPI  Connie [name], a 65 y.o. year old female, is here today because of her Lumbar radiculopathy [M54.16]. Connie [name] primary complain today is Back Pain (lower) Last encounter: My last encounter with her was on 08/27/2021. Pertinent problems: Connie [name] has Degenerative joint disease of cervical and lumbar spine; Lumbar spondylosis; History of lumbar fusion; Lumbar degenerative disc disease; Fibromyalgia; Chronic pain syndrome; Spinal cord stimulator status; Acquired spondylolisthesis; Chronic pain; Generalized osteoarthritis of multiple sites; Spinal stenosis of lumbar region without neurogenic claudication; and Lumbar post-laminectomy syndrome on their pertinent problem list. Pain Assessment: Severity of Chronic pain is reported as a 6 /10. Location: Back Lower/down left leg to foot. Onset: More than a month ago. Quality: Burning, Constant, Cramping. Timing: Constant. Modifying factor(s): meds, rest, reclining. Vitals:  height is 5' (1.524 m) and weight is 270 lb (122.5 kg). Her temporal temperature is 97.2 F (36.2 C) (abnormal). Her blood pressure is 141/54 (abnormal) and her pulse is 63. Her respiration is 16 and  oxygen saturation is 94%.   Reason for encounter: medication management.   [name] follows up today for medication management of her levorphanol.  Over the last 3 to 4 months, we tried opioid rotation to oxycodone as well as hydromorphone which were not effective for the patient.  She states that she would like to return back to her levorphanol and be maintained on this.  Unfortunately, it is costly for her but she would rather pay the higher price and have better pain relief.  She did experience benefit from her caudal ESI and would like to repeat 1 in about a month as she feels that her pain relief is wearing off.  This is reasonable.  Pharmacotherapy Assessment  Analgesic: Levorphanol 2 mg BID  prn, #90/month    Monitoring: [place] PMP: PDMP reviewed during this encounter.       Pharmacotherapy: No side-effects or adverse reactions reported. Compliance: No problems identified. Effectiveness: Clinically acceptable.  Welborn, Susan, RN  09/25/2021  1:45 PM  Sign when Signing Visit Nursing Pain Medication Assessment:  Safety precautions to be maintained throughout the outpatient stay will include: orient to surroundings, keep bed in low position, maintain call bell within reach at all times, provide assistance with transfer out of bed and ambulation.  Medication Inspection Compliance: Pill count conducted under aseptic conditions, in front of the patient. Neither the pills nor the bottle was removed from the patient's sight at any time. Once count was completed pills were immediately returned to the patient in their original bottle.  Medication:  Levorphanol Pill/Patch Count:  64 of 90 pills remain Pill/Patch Appearance: Markings consistent with prescribed medication Bottle Appearance: Standard pharmacy container. Clearly labeled. Filled Date: 05 / 22 / 2023 Last Medication intake:  Today    UDS:  Summary  Date Value Ref   Range Status  08/27/2021 Note  Final    Comment:     ==================================================================== Tox[employer] Select 13 (MW) ==================================================================== Test                             Result       Flag       Units  Drug Present and Declared for Prescription Verification   Oxycodone                      3933         EXPECTED   ng/mg creat   Oxymorphone                    204          EXPECTED   ng/mg creat   Noroxycodone                   >6135        EXPECTED   ng/mg creat   Noroxymorphone                 155          EXPECTED   ng/mg creat    Sources of oxycodone are scheduled prescription medications.    Oxymorphone, noroxycodone, and noroxymorphone are expected    metabolites of oxycodone. Oxymorphone is also available as a    scheduled prescription medication.  Drug Absent but Declared for Prescription Verification   Hydromorphone                  Not Detected UNEXPECTED ng/mg creat ==================================================================== Test                      Result    Flag   Units      Ref Range   Creatinine              163              mg/dL      >=20 ==================================================================== Declared Medications:  The flagging and interpretation on this report are based on the  following declared medications.  Unexpected results may arise from  inaccuracies in the declared medications.   **Note: The testing scope of this panel includes these medications:   Hydromorphone (Dilaudid)  Oxycodone (Percocet)   **Note: The testing scope of this panel does not include the  following reported medications:   Acetaminophen (Percocet)  Azelastine (Astelin)  Bupropion (Wellbutrin XL)  Celecoxib (Celebrex)  Cyanocobalamin  Levothyroxine (Synthroid)  Metoprolol (Toprol)  Pregabalin (Lyrica)  Trazodone (Desyrel) ==================================================================== For clinical consultation, please call [phone]. ====================================================================      ROS  Constitutional: Denies any fever or chills Gastrointestinal: No reported hemesis, hematochezia, vomiting, or acute GI distress Musculoskeletal:  Low back, bilateral leg pain Neurological: No reported episodes of acute onset apraxia, aphasia, dysarthria, agnosia, amnesia, paralysis, loss of coordination, or loss of consciousness  Medication Review  HYDROmorphone, azelastine, buPROPion, celecoxib, levorphanol, levothyroxine, metoprolol succinate, pregabalin, traZODone, and vitamin B-12  History Review  Allergy: Connie [name] has No Known Allergies. Drug: Connie [name]  reports no history of drug use. Alcohol:  reports no history of alcohol use. Tobacco:  reports that she quit smoking about 14 years ago. Her smoking use included cigarettes. She has a 10.00 pack-year smoking history. She has never used smokeless tobacco. Social: Connie [name]  reports that she quit smoking   about 14 years ago. Her smoking use included cigarettes. She has a 10.00 pack-year smoking history. She has never used smokeless tobacco. She reports that she does not drink alcohol and does not use drugs. Medical:  has a past medical history of Anemia, Anxiety, Arthritis, Cervical spondylosis with myelopathy (2018), Chronic pain (2019), DDD (degenerative disc disease), lumbar, Depression, Displacement of cervical intervertebral disc (2018), Dyspnea, Fibromyalgia, GERD (gastroesophageal reflux disease), Heart murmur, Hypertension, Hypothyroidism, Lumbar post-laminectomy syndrome (2018), Lumbosacral radiculitis (2018), Neuropathy due to medical condition ([place]), Osteoarthritis, Osteoporosis, Other long term (current) drug therapy (2019), Pain in the coccyx (2018), Spondylolisthesis (2018), Spondylosis of lumbosacral region without myelopathy or radiculopathy (2018), Spondylosis with myelopathy, lumbar region (2018), and Vitamin D deficiency. Surgical:  Connie [name]  has a past surgical history that includes Cesarean section; Gastric bypass (2004); Lumbar fusion (2016); Spinal cord stimulator implant (2018); Cholecystectomy; Appendectomy; Tonsillectomy; Shoulder arthroscopy with rotator cuff repair (Right, 2008); Joint replacement (Bilateral, 2005); Abdominal hysterectomy (2000); epidural injection (2018); arm surgery (2002); Breast biopsy (Left, 2005); Colonoscopy with propofol (N/A, 08/25/2017); Temporal artery biopsy / ligation; and Cataract extraction w/PHACO (Right, 10/21/2017). Family: family history includes Anxiety disorder in her mother; Breast cancer in her cousin, maternal aunt, and mother; Cancer in her brother, father, and mother; Depression in her mother; Hypertension in her father; Kidney disease in her mother.  Laboratory Chemistry Profile   Renal Lab Results  Component Value Date   BUN 18 06/23/2017   CREATININE 0.60 06/23/2017   GFRAA >60 06/23/2017   GFRNONAA >60 06/23/2017    Hepatic Lab Results  Component Value Date   AST 20 06/23/2017   ALT 9 (L) 06/23/2017   ALBUMIN 3.6 06/23/2017   ALKPHOS 88 06/23/2017    Electrolytes Lab Results  Component Value Date   NA 135 06/23/2017   K 4.9 06/23/2017   CL 102 06/23/2017   CALCIUM 8.5 (L) 06/23/2017    Bone No results found for: VD25OH, VD125OH2TOT, ZO1096EA5, WU9811BJ4, 25OHVITD1, 25OHVITD2, 25OHVITD3, TESTOFREE, TESTOSTERONE  Inflammation (CRP: Acute Phase) (ESR: Chronic Phase) No results found for: CRP, ESRSEDRATE, LATICACIDVEN       Note: Above Lab results reviewed.  Recent Imaging Review  MM 3D SCREEN BREAST BILATERAL CLINICAL DATA:  Screening.  EXAM: DIGITAL SCREENING BILATERAL MAMMOGRAM WITH TOMOSYNTHESIS AND CAD  TECHNIQUE: Bilateral screening digital craniocaudal and mediolateral oblique mammograms were obtained. Bilateral screening digital breast tomosynthesis was performed. The images were evaluated with computer-aided detection.  COMPARISON:   Previous exam(s).  ACR Breast Density Category b: There are scattered areas of fibroglandular density.  FINDINGS: There are no findings suspicious for malignancy.  IMPRESSION: No mammographic evidence of malignancy. A result letter of this screening mammogram will be mailed directly to the patient.  RECOMMENDATION: Screening mammogram in one year. (Code:SM-B-01Y)  BI-RADS CATEGORY  1: Negative.  Electronically Signed   By: Dorise Bullion III M.D.   On: 09/05/2021 18:14 Note: Reviewed        Physical Exam  General appearance: Well nourished, well developed, and well hydrated. In no apparent acute distress Mental status: Alert, oriented x 3 (person, place, & time)       Respiratory: No evidence of acute respiratory distress Eyes: PERLA Vitals: BP (!) 141/54   Pulse 63   Temp (!) 97.2 F (36.2 C) (Temporal)   Resp 16   Ht 5' (1.524 m)   Wt 270 lb (122.5 kg)   SpO2 94%   BMI 52.73 kg/m  BMI: Estimated body mass index is 52.73  kg/m as calculated from the following:   Height as of this encounter: 5' (1.524 m).   Weight as of this encounter: 270 lb (122.5 kg). Ideal: Ideal body weight: 45.5 kg (100 lb 4.9 oz) Adjusted ideal body weight: 76.3 kg (168 lb 3 oz)  Assessment   Diagnosis Status  1. Lumbar radiculopathy   2. Spinal stenosis of lumbosacral region   3. Lumbar degenerative disc disease   4. Spondylosis of cervical region without myelopathy or radiculopathy   5. History of lumbar fusion   6. Chronic pain syndrome    Persistent Persistent Persistent    Plan of Care  1. Lumbar radiculopathy - ToxASSURE Select 13 (MW), Urine - levorphanol (LEVODROMORAN) 2 MG tablet; Take 1 tablet (2 mg total) by mouth every 8 (eight) hours as needed for pain.  Dispense: 90 tablet; Refill: 0 - levorphanol (LEVODROMORAN) 2 MG tablet; Take 1 tablet (2 mg total) by mouth every 8 (eight) hours as needed for pain.  Dispense: 90 tablet; Refill: 0 - levorphanol (LEVODROMORAN) 2 MG  tablet; Take 1 tablet (2 mg total) by mouth every 8 (eight) hours as needed for pain.  Dispense: 90 tablet; Refill: 0 - Caudal Epidural Injection; Future  2. Spinal stenosis of lumbosacral region - ToxASSURE Select 13 (MW), Urine - levorphanol (LEVODROMORAN) 2 MG tablet; Take 1 tablet (2 mg total) by mouth every 8 (eight) hours as needed for pain.  Dispense: 90 tablet; Refill: 0 - levorphanol (LEVODROMORAN) 2 MG tablet; Take 1 tablet (2 mg total) by mouth every 8 (eight) hours as needed for pain.  Dispense: 90 tablet; Refill: 0 - levorphanol (LEVODROMORAN) 2 MG tablet; Take 1 tablet (2 mg total) by mouth every 8 (eight) hours as needed for pain.  Dispense: 90 tablet; Refill: 0 - Caudal Epidural Injection; Future  3. Lumbar degenerative disc disease  4. Spondylosis of cervical region without myelopathy or radiculopathy  5. History of lumbar fusion  6. Chronic pain syndrome - ToxASSURE Select 13 (MW), Urine - levorphanol (LEVODROMORAN) 2 MG tablet; Take 1 tablet (2 mg total) by mouth every 8 (eight) hours as needed for pain.  Dispense: 90 tablet; Refill: 0 - levorphanol (LEVODROMORAN) 2 MG tablet; Take 1 tablet (2 mg total) by mouth every 8 (eight) hours as needed for pain.  Dispense: 90 tablet; Refill: 0 - levorphanol (LEVODROMORAN) 2 MG tablet; Take 1 tablet (2 mg total) by mouth every 8 (eight) hours as needed for pain.  Dispense: 90 tablet; Refill: 0 - Caudal Epidural Injection; Future     Connie [name] has a current medication list which includes the following long-term medication(s): azelastine, bupropion, levothyroxine, metoprolol succinate, pregabalin, and trazodone.  Pharmacotherapy (Medications Ordered): Meds ordered this encounter  Medications   levorphanol (LEVODROMORAN) 2 MG tablet    Sig: Take 1 tablet (2 mg total) by mouth every 8 (eight) hours as needed for pain.    Dispense:  90 tablet    Refill:  0   levorphanol (LEVODROMORAN) 2 MG tablet    Sig: Take 1  tablet (2 mg total) by mouth every 8 (eight) hours as needed for pain.    Dispense:  90 tablet    Refill:  0   levorphanol (LEVODROMORAN) 2 MG tablet    Sig: Take 1 tablet (2 mg total) by mouth every 8 (eight) hours as needed for pain.    Dispense:  90 tablet    Refill:  0   Orders:  Orders Placed This Encounter  Procedures     Caudal Epidural Injection    Standing Status:   Future    Standing Expiration Date:   03/27/2022    Scheduling Instructions:     Laterality: Midline     Level(s): Sacrococcygeal canal (Tailbone area)     Sedation: Patient's choice     Scheduling Timeframe: As soon as pre-approved    Order Specific Question:   Where will this procedure be performed?    Answer:   [facility]   ToxASSURE Select 13 (MW), Urine    Volume: 30 ml(s). Minimum 3 ml of urine is needed. Document temperature of fresh sample. Indications: Long term (current) use of opiate analgesic (Z79.891)    Order Specific Question:   Release to patient    Answer:   Immediate   Follow-up plan:   Return in about 6 weeks (around 11/06/2021) for Caudal ESI , in clinic NS.    Recent Visits Date Type Provider Dept  08/27/21 Office Visit , , MD [facility]  07/29/21 Procedure visit , , MD [facility]  07/09/21 Office Visit , , MD [facility]  Showing recent visits within past 90 days and meeting all other requirements Today's Visits Date Type Provider Dept  09/25/21 Office Visit , , MD [facility]  Showing today's visits and meeting all other requirements Future Appointments No visits were found meeting these conditions. Showing future appointments within next 90 days and meeting all other requirements  I discussed the assessment and treatment plan with the patient. The patient was provided an opportunity to ask questions and all were answered. The patient agreed with the plan and demonstrated an  understanding of the instructions.  Patient advised to call back or seek an in-person evaluation if the symptoms or condition worsens.  Duration of encounter: 30minutes.  Note by:  , MD Date: 09/25/2021; Time: 2:32 PM 

## 2021-09-25 NOTE — Progress Notes (Signed)
Nursing Pain Medication Assessment:  Safety precautions to be maintained throughout the outpatient stay will include: orient to surroundings, keep bed in low position, maintain call bell within reach at all times, provide assistance with transfer out of bed and ambulation.  Medication Inspection Compliance: Pill count conducted under aseptic conditions, in front of the patient. Neither the pills nor the bottle was removed from the patient's sight at any time. Once count was completed pills were immediately returned to the patient in their original bottle.  Medication:  Levorphanol Pill/Patch Count:  64 of 90 pills remain Pill/Patch Appearance: Markings consistent with prescribed medication Bottle Appearance: Standard pharmacy container. Clearly labeled. Filled Date: 05 / 22 / 2023 Last Medication intake:  Today

## 2021-09-30 LAB — TOXASSURE SELECT 13 (MW), URINE

## 2021-10-31 ENCOUNTER — Ambulatory Visit: Payer: Medicare Other | Admitting: Dermatology

## 2021-10-31 DIAGNOSIS — L853 Xerosis cutis: Secondary | ICD-10-CM | POA: Diagnosis not present

## 2021-10-31 DIAGNOSIS — T148XXA Other injury of unspecified body region, initial encounter: Secondary | ICD-10-CM

## 2021-10-31 DIAGNOSIS — S0081XA Abrasion of other part of head, initial encounter: Secondary | ICD-10-CM

## 2021-10-31 DIAGNOSIS — G629 Polyneuropathy, unspecified: Secondary | ICD-10-CM | POA: Diagnosis not present

## 2021-10-31 MED ORDER — HYDROCORTISONE 2.5 % EX CREA: TOPICAL_CREAM | CUTANEOUS | 3 refills | Status: DC

## 2021-10-31 NOTE — Progress Notes (Signed)
   New Patient Visit  Subjective  Connie [name] is a 66 y.o. female who presents for the following: bites? (X 1 month, Pt experinces bite like pain and stinging, scalp, face, pt denies picking except for the on on glabella, pt says feet get a tingling sensation, does have neuropathy and her doctor increased her Lyrica and has not seen an improvement, has tried otc Nicks, Listerine, increased hair loss, pt doe not wear make up).  Patient accompanied by husband.  The following portions of the chart were reviewed this encounter and updated as appropriate:       Review of Systems:  No other skin or systemic complaints except as noted in HPI or Assessment and Plan.  Objective  Well appearing patient in no apparent distress; mood and affect are within normal limits.  A focused examination was performed including face, scalp, arms. Relevant physical exam findings are noted in the Assessment and Plan.  face Healing pink excoriations glabella, R mid forehead, scalp clear  feet, hands Hands, feet clear, pt states she gets tingling/stinging sensations in fingers/toes  face, hands Mild xerosis on hands, face, feet, scalp clear    Assessment & Plan  Excoriation face  Benign, observe.  Avoid picking  Neuropathy feet, hands  Hx of neuropathy, pt currently on Lyrica,  PCP could consider Gabapentin if increased Lyrica not helping  Xerosis cutis face, hands  With paresthesias (itch, sting, crawling), no rash, no evidence of scabies mites, husband not itching Pt takes medication that gives her severe dry mouth, could also be drying out skin?  Recommend mild soap and moisturizing cream 1-2 times daily.  Gentle skin care handout provided.    Recommend CeraVe Anti-itch cream (pramoxine) bid to hands, feet, and face Start HC 2.5% cr qd/bid up to 5d/wk aa itchy, stingy sensation on face, hands, feet Recommend Dove for Sensitive skin soap to face and body  hydrocortisone 2.5 % cream -  face, hands Apply topically as directed. Qd to bid up to 5 days a week to aa face   Return in about 1 month (around 12/01/2021) for xerosis, .  I, Sonya Hupman, RMA, am acting as scribe for Brendolyn Patty, MD .  Documentation: I have reviewed the above documentation for accuracy and completeness, and I agree with the above.  Brendolyn Patty MD

## 2021-10-31 NOTE — Patient Instructions (Addendum)
Recommend OTC [place] Bond Rapid Relief Anti-Itch cream (pramoxine + menthol), CeraVe Anti-itch cream or lotion (pramoxine), Sarna lotion (Original- menthol + camphor or Sensitive- pramoxine) or Eucerin 12 hour Itch Relief lotion (menthol) up to 3 times per day to areas on body that are itchy.   Recommend CeraVe Anti-itch cream (pramoxine) 2 times a day to hands and face Start Hydrocortisone 2.5% cream 1 to 2 times a day up to 5 days a week affected itchy, stingy sensation on face Recommend Dove for Sensitive skin soap to face and body  Dry Skin Care  What causes dry skin?  Dry skin is common and results from inadequate moisture in the outer skin layers. Dry skin usually results from the excessive loss of moisture from the skin surface. This occurs due to two major factors: Normally the skin's oil glands deposit a layer of oil on the skin's surface. This layer of oil prevents the loss of moisture from the skin. Exposure to soaps, cleaners, solvents, and disinfectants removes this oily film, allowing water to escape. Water loss from the skin increases when the humidity is low. During winter months we spend a lot of time indoors where the air is heated. Heated air has very low humidity. This also contributes to dry skin.  A tendency for dry skin may accompany such disorders as eczema. Also, as people age, the number of functioning oil glands decreases, and the tendency toward dry skin can be a sensation of skin tightness when emerging from the shower.  How do I manage dry skin?  Humidify your environment. This can be accomplished by using a humidifier in your bedroom at night during winter months. Bathing can actually put moisture back into your skin if done right. Take the following steps while bathing to sooth dry skin: Avoid hot water, which only dries the skin and makes itching worse. Use warm water. Avoid washcloths or extensive rubbing or scrubbing. Use mild soaps like unscented Dove, Oil of  Olay, Cetaphil, Basis, or CeraVe. If you take baths rather than showers, rinse off soap residue with clean water before getting out of tub. Once out of the shower/tub, pat dry gently with a soft towel. Leave your skin damp. While still damp, apply any medicated ointment/cream you were prescribed to the affected areas. After you apply your medicated ointment/cream, then apply your moisturizer to your whole body.This is the most important step in dry skin care. If this is omitted, your skin will continue to be dry. The choice of moisturizer is also very important. In general, lotion will not provider enough moisture to severely dry skin because it is water based. You should use an ointment or cream. Moisturizers should also be unscented. Good choices include Vaseline (plain petrolatum), Aquaphor, Cetaphil, CeraVe, Vanicream, DML Forte, Aveeno moisture, or Eucerin Cream. Bath oils can be helpful, but do not replace the application of moisturizer after the bath. In addition, they make the tub slippery causing an increased risk for falls. Therefore, we do not recommend their use.     Due to recent changes in healthcare laws, you may see results of your pathology and/or laboratory studies on MyChart before the doctors have had a chance to review them. We understand that in some cases there may be results that are confusing or concerning to you. Please understand that not all results are received at the same time and often the doctors may need to interpret multiple results in order to provide you with the best plan of care  or course of treatment. Therefore, we ask that you please give [place] 2 business days to thoroughly review all your results before contacting the office for clarification. Should we see a critical lab result, you will be contacted sooner.   If You Need Anything After Your Visit  If you have any questions or concerns for your doctor, please call our main line at [phone] and press option 4  to reach your doctor's medical assistant. If no one answers, please leave a voicemail as directed and we will return your call as soon as possible. Messages left after 4 pm will be answered the following business day.   You may also send [place] a message via [place]. We typically respond to MyChart messages within 1-2 business days.  For prescription refills, please ask your pharmacy to contact our office. Our fax number is [phone].  If you have an urgent issue when the clinic is closed that cannot wait until the next business day, you can page your doctor at the number below.    Please note that while we do our best to be available for urgent issues outside of office hours, we are not available 24/7.   If you have an urgent issue and are unable to reach [place], you may choose to seek medical care at your doctor's office, retail clinic, urgent care center, or emergency room.  If you have a medical emergency, please immediately call 911 or go to the emergency department.  Pager Numbers  - Dr. Nehemiah Massed: [phone]  - Dr. Laurence Ferrari: [phone]  - Dr. Nicole Kindred: [phone]  In the event of inclement weather, please call our main line at [phone] for an update on the status of any delays or closures.  Dermatology Medication Tips: Please keep the boxes that topical medications come in in order to help keep track of the instructions about where and how to use these. Pharmacies typically print the medication instructions only on the boxes and not directly on the medication tubes.   If your medication is too expensive, please contact our office at [phone] option 4 or send [place] a message through [place].   We are unable to tell what your co-pay for medications will be in advance as this is different depending on your insurance coverage. However, we may be able to find a substitute medication at lower cost or fill out paperwork to get insurance to cover a needed medication.   If a prior  authorization is required to get your medication covered by your insurance company, please allow [place] 1-2 business days to complete this process.  Drug prices often vary depending on where the prescription is filled and some pharmacies may offer cheaper prices.  The website www.goodrx.com contains coupons for medications through different pharmacies. The prices here do not account for what the cost may be with help from insurance (it may be cheaper with your insurance), but the website can give you the price if you did not use any insurance.  - You can print the associated coupon and take it with your prescription to the pharmacy.  - You may also stop by our office during regular business hours and pick up a GoodRx coupon card.  - If you need your prescription sent electronically to a different pharmacy, notify our office through [facility] or by phone at [phone] option 4.     Si Usted Necesita Algo Despus de Su Visita  Tambin puede enviarnos un mensaje a travs de Pharmacist, community. Por lo general respondemos  a los mensajes de MyChart en el transcurso de 1 a 2 das hbiles.  Para renovar recetas, por favor pida a su farmacia que se ponga en contacto con nuestra oficina. Harland Dingwall de fax es [place] [phone].  Si tiene un asunto urgente cuando la clnica est cerrada y que no puede esperar hasta el siguiente da hbil, puede llamar/localizar a su doctor(a) al nmero que aparece a continuacin.   Por favor, tenga en cuenta que aunque hacemos todo lo posible para estar disponibles para asuntos urgentes fuera del horario de [place], no estamos disponibles las 24 horas del da, los 7 das de la [place].   Si tiene un problema urgente y no puede comunicarse con nosotros, puede optar por buscar atencin mdica  en el consultorio de su doctor(a), en una clnica privada, en un centro de atencin urgente o en una sala de emergencias.  Si tiene Engineering geologist, por favor llame  inmediatamente al 911 o vaya a la sala de emergencias.  Nmeros de bper  - Dr. Nehemiah Massed: [phone]  - Dra. Moye: [phone]  - Dra. Nicole Kindred: [phone]  En caso de inclemencias del [place], por favor llame a Johnsie Kindred principal al [phone] para una actualizacin sobre el [place] de cualquier retraso o cierre.  Consejos para la medicacin en dermatologa: Por favor, guarde las cajas en las que vienen los medicamentos de uso tpico para ayudarle a seguir las instrucciones sobre dnde y cmo usarlos. Las farmacias generalmente imprimen las instrucciones del medicamento slo en las cajas y no directamente en los tubos del [place].   Si su medicamento es muy caro, por favor, pngase en contacto con Zigmund Daniel llamando al [phone] y presione la opcin 4 o envenos un mensaje a travs de Pharmacist, community.   No podemos decirle cul ser su copago por los medicamentos por adelantado ya que esto es diferente dependiendo de la cobertura de su seguro. Sin embargo, es posible que podamos encontrar un medicamento sustituto a Electrical engineer un formulario para que el seguro cubra el medicamento que se considera necesario.   Si se requiere una autorizacin previa para que su compaa de seguros [place] su medicamento, por favor permtanos de 1 a 2 das hbiles para completar este proceso.  Los precios de los medicamentos varan con frecuencia dependiendo del Environmental consultant de dnde se surte la receta y alguna farmacias pueden ofrecer precios ms baratos.  El sitio web www.goodrx.com tiene cupones para medicamentos de Airline pilot. Los precios aqu no tienen en cuenta lo que podra costar con la ayuda del seguro (puede ser ms barato con su seguro), pero el sitio web puede darle el precio si no utiliz Research scientist (physical sciences).  - Puede imprimir el cupn correspondiente y llevarlo con su receta a la farmacia.  - Tambin puede pasar por nuestra oficina durante el horario de atencin regular y Charity fundraiser  una tarjeta de cupones de GoodRx.  - Si necesita que su receta se enve electrnicamente a una farmacia diferente, informe a nuestra oficina a travs de MyChart de [place] o por telfono llamando al [phone] y presione la opcin 4.

## 2021-11-06 ENCOUNTER — Ambulatory Visit
Admission: RE | Admit: 2021-11-06 | Discharge: 2021-11-06 | Disposition: A | Discharge status: Home / Self Care | Payer: Medicare Other | Source: Ambulatory Visit | Attending: Student in an Organized Health Care Education/Training Program | Admitting: Student in an Organized Health Care Education/Training Program

## 2021-11-06 ENCOUNTER — Ambulatory Visit
Payer: Medicare Other | Attending: Student in an Organized Health Care Education/Training Program | Admitting: Student in an Organized Health Care Education/Training Program

## 2021-11-06 ENCOUNTER — Encounter: Payer: Self-pay | Admitting: Student in an Organized Health Care Education/Training Program

## 2021-11-06 DIAGNOSIS — M4807 Spinal stenosis, lumbosacral region: Secondary | ICD-10-CM | POA: Diagnosis present

## 2021-11-06 DIAGNOSIS — G894 Chronic pain syndrome: Secondary | ICD-10-CM | POA: Diagnosis present

## 2021-11-06 DIAGNOSIS — M5416 Radiculopathy, lumbar region: Secondary | ICD-10-CM | POA: Diagnosis present

## 2021-11-06 MED ORDER — ROPIVACAINE HCL 2 MG/ML IJ SOLN
2.0000 mL | Freq: Once | INTRAMUSCULAR | Status: AC
Start: 2021-11-06 — End: 2021-11-06
  Administered 2021-11-06: 2 mL via EPIDURAL

## 2021-11-06 MED ORDER — LIDOCAINE HCL 2 % IJ SOLN
INTRAMUSCULAR | Status: AC
  Filled 2021-11-06: qty 20

## 2021-11-06 MED ORDER — IOHEXOL 180 MG/ML  SOLN
INTRAMUSCULAR | Status: AC
  Filled 2021-11-06: qty 20

## 2021-11-06 MED ORDER — DEXAMETHASONE SODIUM PHOSPHATE 10 MG/ML IJ SOLN
10.0000 mg | Freq: Once | INTRAMUSCULAR | Status: AC
Start: 2021-11-06 — End: 2021-11-06
  Administered 2021-11-06: 10 mg

## 2021-11-06 MED ORDER — IOHEXOL 180 MG/ML  SOLN
10.0000 mL | Freq: Once | INTRAMUSCULAR | Status: AC
Start: 2021-11-06 — End: 2021-11-06
  Administered 2021-11-06: 10 mL via EPIDURAL

## 2021-11-06 MED ORDER — SODIUM CHLORIDE 0.9% FLUSH
2.0000 mL | Freq: Once | INTRAVENOUS | Status: AC
Start: 2021-11-06 — End: 2021-11-06
  Administered 2021-11-06: 2 mL

## 2021-11-06 MED ORDER — SODIUM CHLORIDE (PF) 0.9 % IJ SOLN
INTRAMUSCULAR | Status: AC
  Filled 2021-11-06: qty 10

## 2021-11-06 MED ORDER — ROPIVACAINE HCL 2 MG/ML IJ SOLN
INTRAMUSCULAR | Status: AC
  Filled 2021-11-06: qty 20

## 2021-11-06 MED ORDER — DEXAMETHASONE SODIUM PHOSPHATE 10 MG/ML IJ SOLN
INTRAMUSCULAR | Status: AC
  Filled 2021-11-06: qty 1

## 2021-11-06 MED ORDER — LIDOCAINE HCL 2 % IJ SOLN
20.0000 mL | Freq: Once | INTRAMUSCULAR | Status: AC
Start: 2021-11-06 — End: 2021-11-06
  Administered 2021-11-06: 400 mg

## 2021-11-06 NOTE — Progress Notes (Signed)
Safety precautions to be maintained throughout the outpatient stay will include: orient to surroundings, keep bed in low position, maintain call bell within reach at all times, provide assistance with transfer out of bed and ambulation.  

## 2021-11-06 NOTE — Progress Notes (Addendum)
PROVIDER NOTE: Interpretation of information contained herein should be left to medically-trained personnel. Specific patient instructions are provided elsewhere under "Patient Instructions" section of medical record. This document was created in part using STT-dictation technology, any transcriptional errors that may result from this process are unintentional.  Patient: Connie [name] Type: Established DOB: 1955/09/10 MRN: [id number] PCP: Tracie Harrier, MD  Service: Procedure DOS: 11/06/2021 Setting: Ambulatory Location: Ambulatory outpatient facility Delivery: Face-to-face Provider: [name], MD Specialty: Interventional Pain Management Specialty designation: 09 Location: Outpatient facility Ref. Prov.: [name], MD    Primary Reason for Visit: Interventional Pain Management Treatment. CC: Leg Pain (left)    Procedure:          Anesthesia, Analgesia, Anxiolysis:  Type: Therapeutic Epidural Steroid Injection #3 (#1 done 05/01/21, #2 07/29/21 ) Region: Caudal Level: Sacrococcygeal   Laterality: Midline       Anesthesia: Local (1-2% Lidocaine)  Anxiolysis: None  Sedation: None  Guidance: Fluoroscopy           Position: Prone   1. Lumbar radiculopathy   2. Spinal stenosis of lumbosacral region   3. Chronic pain syndrome    NAS-11 Pain score:   Pre-procedure: 9 /10   Post-procedure: 4 /10     Pre-op H&P Assessment:  Connie [name] is a 66 y.o. (year old), female patient, seen today for interventional treatment. She  has a past surgical history that includes Cesarean section; Gastric bypass (2004); Lumbar fusion (2016); Spinal cord stimulator implant (2018); Cholecystectomy; Appendectomy; Tonsillectomy; Shoulder arthroscopy with rotator cuff repair (Right, 2008); Joint replacement (Bilateral, 2005); Abdominal hysterectomy (2000); epidural injection (2018); arm surgery (2002); Breast biopsy (Left, 2005); Colonoscopy with propofol (N/A, 08/25/2017); Temporal artery biopsy /  ligation; and Cataract extraction w/PHACO (Right, 10/21/2017). Connie [name] has a current medication list which includes the following prescription(s): azelastine, celecoxib, hydrocortisone, [START ON 11/15/2021] levorphanol, levothyroxine, metoprolol succinate, pregabalin, trazodone, bupropion, levorphanol, [START ON 12/15/2021] levorphanol, and vitamin b-12. Her primarily concern today is the Leg Pain (left)  Initial Vital Signs:  Pulse/HCG Rate: 64ECG Heart Rate: 64 Temp: (!) 96.6 F (35.9 C) Resp: 16 BP:  128/66 SpO2: 94 %  BMI: Estimated body mass index is 52.73 kg/m as calculated from the following:   Height as of this encounter: 5' (1.524 m).   Weight as of this encounter: 270 lb (122.5 kg).  Risk Assessment: Allergies: Reviewed. She is allergic to pedi-pre tape spray [wound dressing adhesive].  Allergy Precautions: None required Coagulopathies: Reviewed. None identified.  Blood-thinner therapy: None at this time Active Infection(s): Reviewed. None identified. Connie [name] is afebrile  Site Confirmation: Connie [name] was asked to confirm the procedure and laterality before marking the site Procedure checklist: Completed Consent: Before the procedure and under the influence of no sedative(s), amnesic(s), or anxiolytics, the patient was informed of the treatment options, risks and possible complications. To fulfill our ethical and legal obligations, as recommended by the American Medical Association's Code of Ethics, I have informed the patient of my clinical impression; the nature and purpose of the treatment or procedure; the risks, benefits, and possible complications of the intervention; the alternatives, including doing nothing; the risk(s) and benefit(s) of the alternative treatment(s) or procedure(s); and the risk(s) and benefit(s) of doing nothing. The patient was provided information about the general risks and possible complications associated with the procedure. These may include,  but are not limited to: failure to achieve desired goals, infection, bleeding, organ or nerve damage, allergic reactions, paralysis, and death. In addition, the  patient was informed of those risks and complications associated to Spine-related procedures, such as failure to decrease pain; infection (i.e.: Meningitis, epidural or intraspinal abscess); bleeding (i.e.: epidural hematoma, subarachnoid hemorrhage, or any other type of intraspinal or peri-dural bleeding); organ or nerve damage (i.e.: Any type of peripheral nerve, nerve root, or spinal cord injury) with subsequent damage to sensory, motor, and/or autonomic systems, resulting in permanent pain, numbness, and/or weakness of one or several areas of the body; allergic reactions; (i.e.: anaphylactic reaction); and/or death. Furthermore, the patient was informed of those risks and complications associated with the medications. These include, but are not limited to: allergic reactions (i.e.: anaphylactic or anaphylactoid reaction(s)); adrenal axis suppression; blood sugar elevation that in diabetics may result in ketoacidosis or comma; water retention that in patients with history of congestive heart failure may result in shortness of breath, pulmonary edema, and decompensation with resultant heart failure; weight gain; swelling or edema; medication-induced neural toxicity; particulate matter embolism and blood vessel occlusion with resultant organ, and/or nervous system infarction; and/or aseptic necrosis of one or more joints. Finally, the patient was informed that Medicine is not an exact science; therefore, there is also the possibility of unforeseen or unpredictable risks and/or possible complications that may result in a catastrophic outcome. The patient indicated having understood very clearly. We have given the patient no guarantees and we have made no promises. Enough time was given to the patient to ask questions, all of which were answered to the  patient's satisfaction. Connie [name] has indicated that she wanted to continue with the procedure. Attestation: I, the ordering provider, attest that I have discussed with the patient the benefits, risks, side-effects, alternatives, likelihood of achieving goals, and potential problems during recovery for the procedure that I have provided informed consent. Date  Time: 11/06/2021 11:17 AM  Pre-Procedure Preparation:  Monitoring: As per clinic protocol. Respiration, ETCO2, SpO2, BP, heart rate and rhythm monitor placed and checked for adequate function Safety Precautions: Patient was assessed for positional comfort and pressure points before starting the procedure. Time-out: I initiated and conducted the "Time-out" before starting the procedure, as per protocol. The patient was asked to participate by confirming the accuracy of the "Time Out" information. Verification of the correct person, site, and procedure were performed and confirmed by me, the nursing staff, and the patient. "Time-out" conducted as per Joint Commission's Universal Protocol (UP.01.01.01). Time: 1146  Description of Procedure:          Target Area: Caudal Epidural Canal. Approach: Midline approach. Area Prepped: Entire Posterior Sacrococcygeal Region DuraPrep (Iodine Povacrylex [0.7% available iodine] and Isopropyl Alcohol, 74% w/w) Safety Precautions: Aspiration looking for blood return was conducted prior to all injections. At no point did we inject any substances, as a needle was being advanced. No attempts were made at seeking any paresthesias. Safe injection practices and needle disposal techniques used. Medications properly checked for expiration dates. SDV (single dose vial) medications used. Description of the Procedure: Protocol guidelines were followed. The patient was placed in position over the fluoroscopy table. The target area was identified and the area prepped in the usual manner. Skin & deeper tissues infiltrated  with local anesthetic. Appropriate amount of time allowed to pass for local anesthetics to take effect. The procedure needles were then advanced to the target area. Proper needle placement secured. Negative aspiration confirmed. Solution injected in intermittent fashion, asking for systemic symptoms every 0.5cc of injectate. The needles were then removed and the area cleansed, making sure to leave  some of the prepping solution back to take advantage of its long term bactericidal properties. Vitals:   11/06/21 1121 11/06/21 1145 11/06/21 1150 11/06/21 1152  BP: 128/66 (!) 151/62 133/63 129/66  Pulse: 64     Resp: '16 18 15 15  '$ Temp: (!) 96.6 F (35.9 C)     SpO2: 94% 95% 94% 95%  Weight: 270 lb (122.5 kg)     Height: 5' (1.524 m)       Start Time: 1146 hrs. End Time: 1151 hrs. Materials:  Tray: Epidural Needle(s) Type: Epidural needle Gauge: 22G Length: 3.5-in  6 cc solution made of 3 cc of preservative-free saline, 2 cc of 0.2% ropivacaine, 1 cc of Decadron 10 mg/cc.  Imaging Guidance (Spinal):          Type of Imaging Technique: Fluoroscopy Guidance (Spinal) Indication(s): Assistance in needle guidance and placement for procedures requiring needle placement in or near specific anatomical locations not easily accessible without such assistance. Exposure Time: Please see nurses notes. Contrast: Before injecting any contrast, we confirmed that the patient did not have an allergy to iodine, shellfish, or radiological contrast. Once satisfactory needle placement was completed at the desired level, radiological contrast was injected. Contrast injected under live fluoroscopy. No contrast complications. See chart for type and volume of contrast used. Fluoroscopic Guidance: I was personally present during the use of fluoroscopy. "Tunnel Vision Technique" used to obtain the best possible view of the target area. Parallax error corrected before commencing the procedure. "Direction-depth-direction"  technique used to introduce the needle under continuous pulsed fluoroscopy. Once target was reached, antero-posterior, oblique, and lateral fluoroscopic projection used confirm needle placement in all planes. Images permanently stored in EMR. Interpretation: I personally interpreted the imaging intraoperatively. Adequate needle placement confirmed in multiple planes. Appropriate spread of contrast into desired area was observed. No evidence of afferent or efferent intravascular uptake. No intrathecal or subarachnoid spread observed. Permanent images saved into the patient's record.   Post-operative Assessment:  Post-procedure Vital Signs:  Pulse/HCG Rate: 6462 Temp: (!) 96.6 F (35.9 C) Resp: 15 BP:  129/66 SpO2: 95 %  EBL: None  Complications: No immediate post-treatment complications observed by team, or reported by patient.  Note: The patient tolerated the entire procedure well. A repeat set of vitals were taken after the procedure and the patient was kept under observation following institutional policy, for this type of procedure. Post-procedural neurological assessment was performed, showing return to baseline, prior to discharge. The patient was provided with post-procedure discharge instructions, including a section on how to identify potential problems. Should any problems arise concerning this procedure, the patient was given instructions to immediately contact us, at any time, without hesitation. In any case, we plan to contact the patient by telephone for a follow-up status report regarding this interventional procedure.  Comments:  No additional relevant information.     Plan of Care  Orders:  Orders Placed This Encounter  Procedures   DG PAIN CLINIC C-ARM 1-60 MIN NO REPORT    Intraoperative interpretation by procedural physician at [place].    Standing Status:   Standing    Number of Occurrences:   1    Order Specific Question:   Reason for exam:     Answer:   Assistance in needle guidance and placement for procedures requiring needle placement in or near specific anatomical locations not easily accessible without such assistance.   Chronic Opioid Analgesic:  Percocet 10 mg TID prn MME=45   Medications ordered  for procedure: Meds ordered this encounter  Medications   iohexol (OMNIPAQUE) 180 MG/ML injection 10 mL    Must be Myelogram-compatible. If not available, you may substitute with a water-soluble, non-ionic, hypoallergenic, myelogram-compatible radiological contrast medium.   lidocaine (XYLOCAINE) 2 % (with pres) injection 400 mg   sodium chloride flush (NS) 0.9 % injection 2 mL   ropivacaine (PF) 2 mg/mL (0.2%) (NAROPIN) injection 2 mL   dexamethasone (DECADRON) injection 10 mg   Medications administered: We administered iohexol, lidocaine, sodium chloride flush, ropivacaine (PF) 2 mg/mL (0.2%), and dexamethasone.  See the medical record for exact dosing, route, and time of administration.  Follow-up plan:   Return in about 4 weeks (around 12/04/2021) for Post Procedure Evaluation, virtual.     Recent Visits Date Type Provider Dept  09/25/21 Office Visit [name], MD [facility]  08/27/21 Office Visit [name], MD [facility]  Showing recent visits within past 90 days and meeting all other requirements Today's Visits Date Type Provider Dept  11/06/21 Procedure visit [name], MD [facility]  Showing today's visits and meeting all other requirements Future Appointments Date Type Provider Dept  12/04/21 Appointment [name], MD [facility]  01/07/22 Appointment [name], MD [facility]  Showing future appointments within next 90 days and meeting all other requirements  Disposition: Discharge home  Discharge (Date  Time): 11/06/2021; 1205 hrs.   Primary Care Physician: Tracie Harrier, MD Location: [facility] Note  by: [name], MD Date: 11/06/2021; Time: 1:16 PM  Disclaimer:  Medicine is not an exact science. The only guarantee in medicine is that nothing is guaranteed. It is important to note that the decision to proceed with this intervention was based on the information collected from the patient. The Data and conclusions were drawn from the patient's questionnaire, the interview, and the physical examination. Because the information was provided in large part by the patient, it cannot be guaranteed that it has not been purposely or unconsciously manipulated. Every effort has been made to obtain as much relevant data as possible for this evaluation. It is important to note that the conclusions that lead to this procedure are derived in large part from the available data. Always take into account that the treatment will also be dependent on availability of resources and existing treatment guidelines, considered by other Pain Management Practitioners as being common knowledge and practice, at the time of the intervention. For Medico-Legal purposes, it is also important to point out that variation in procedural techniques and pharmacological choices are the acceptable norm. The indications, contraindications, technique, and results of the above procedure should only be interpreted and judged by a Board-Certified Interventional Pain Specialist with extensive familiarity and expertise in the same exact procedure and technique.

## 2021-11-06 NOTE — Patient Instructions (Signed)
Pain Management Discharge Instructions  General Discharge Instructions :  If you need to reach your doctor call: Monday-Friday 8:00 am - 4:00 pm at [phone] or toll free [phone].  After clinic hours [phone] to have operator reach doctor.  Bring all of your medication bottles to all your appointments in the pain clinic.  To cancel or reschedule your appointment with Pain Management please remember to call 24 hours in advance to avoid a fee.  Refer to the educational materials which you have been given on: General Risks, I had my Procedure. Discharge Instructions, Post Sedation.  Post Procedure Instructions:  The drugs you were given will stay in your system until tomorrow, so for the next 24 hours you should not drive, make any legal decisions or drink any alcoholic beverages.  You may eat anything you prefer, but it is better to start with liquids then soups and crackers, and gradually work up to solid foods.  Please notify your doctor immediately if you have any unusual bleeding, trouble breathing or pain that is not related to your normal pain.  Depending on the type of procedure that was done, some parts of your body may feel week and/or numb.  This usually clears up by tonight or the next day.  Walk with the use of an assistive device or accompanied by an adult for the 24 hours.  You may use ice on the affected area for the first 24 hours.  Put ice in a Ziploc bag and cover with a towel and place against area 15 minutes on 15 minutes off.  You may switch to heat after 24 hours.Epidural Steroid Injection Patient Information  Description: The epidural space surrounds the nerves as they exit the spinal cord.  In some patients, the nerves can be compressed and inflamed by a bulging disc or a tight spinal canal (spinal stenosis).  By injecting steroids into the epidural space, we can bring irritated nerves into direct contact with a potentially helpful medication.  These  steroids act directly on the irritated nerves and can reduce swelling and inflammation which often leads to decreased pain.  Epidural steroids may be injected anywhere along the spine and from the neck to the low back depending upon the location of your pain.   After numbing the skin with local anesthetic (like Novocaine), a small needle is passed into the epidural space slowly.  You may experience a sensation of pressure while this is being done.  The entire block usually last less than 10 minutes.  Conditions which may be treated by epidural steroids:  Low back and leg pain Neck and arm pain Spinal stenosis Post-laminectomy syndrome Herpes zoster (shingles) pain Pain from compression fractures  Preparation for the injection:  Do not eat any solid food or dairy products within 8 hours of your appointment.  You may drink clear liquids up to 3 hours before appointment.  Clear liquids include water, black coffee, juice or soda.  No milk or cream please. You may take your regular medication, including pain medications, with a sip of water before your appointment  Diabetics should hold regular insulin (if taken separately) and take 1/2 normal NPH dos the morning of the procedure.  Carry some sugar containing items with you to your appointment. A driver must accompany you and be prepared to drive you home after your procedure.  Bring all your current medications with your. An IV may be inserted and sedation may be given at the discretion of the physician.     A blood pressure cuff, EKG and other monitors will often be applied during the procedure.  Some patients may need to have extra oxygen administered for a short period. You will be asked to provide medical information, including your allergies, prior to the procedure.  We must know immediately if you are taking blood thinners (like Coumadin/Warfarin)  Or if you are allergic to IV iodine contrast (dye). We must know if you could possible be  pregnant.  Possible side-effects: Bleeding from needle site Infection (rare, may require surgery) Nerve injury (rare) Numbness & tingling (temporary) Difficulty urinating (rare, temporary) Spinal headache ( a headache worse with upright posture) Light -headedness (temporary) Pain at injection site (several days) Decreased blood pressure (temporary) Weakness in arm/leg (temporary) Pressure sensation in back/neck (temporary)  Call if you experience: Fever/chills associated with headache or increased back/neck pain. Headache worsened by an upright position. New onset weakness or numbness of an extremity below the injection site Hives or difficulty breathing (go to the emergency room) Inflammation or drainage at the infection site Severe back/neck pain Any new symptoms which are concerning to you  Please note:  Although the local anesthetic injected can often make your back or neck feel good for several hours after the injection, the pain will likely return.  It takes 3-7 days for steroids to work in the epidural space.  You may not notice any pain relief for at least that one week.  If effective, we will often do a series of three injections spaced 3-6 weeks apart to maximally decrease your pain.  After the initial series, we generally will wait several months before considering a repeat injection of the same type.  If you have any questions, please call [phone] [facility] 

## 2021-11-07 ENCOUNTER — Telehealth: Payer: Self-pay | Admitting: *Deleted

## 2021-11-07 NOTE — Telephone Encounter (Signed)
Post procedure call;  patient reports that she is doing well.

## 2021-12-03 ENCOUNTER — Ambulatory Visit: Payer: Medicare Other | Admitting: Dermatology

## 2021-12-03 ENCOUNTER — Telehealth: Payer: Self-pay | Admitting: *Deleted

## 2021-12-03 NOTE — Telephone Encounter (Signed)
Attempted to call for pre appointment review of allergies/meds. Message left. 

## 2021-12-04 ENCOUNTER — Ambulatory Visit
Payer: Medicare Other | Attending: Student in an Organized Health Care Education/Training Program | Admitting: Student in an Organized Health Care Education/Training Program

## 2021-12-04 ENCOUNTER — Encounter: Payer: Self-pay | Admitting: Student in an Organized Health Care Education/Training Program

## 2021-12-04 DIAGNOSIS — M4807 Spinal stenosis, lumbosacral region: Secondary | ICD-10-CM | POA: Diagnosis not present

## 2021-12-04 DIAGNOSIS — M5416 Radiculopathy, lumbar region: Secondary | ICD-10-CM | POA: Diagnosis not present

## 2021-12-04 DIAGNOSIS — G894 Chronic pain syndrome: Secondary | ICD-10-CM | POA: Diagnosis not present

## 2021-12-04 NOTE — Progress Notes (Signed)
Patient: Connie [name]  Service Category: E/M  Provider: [name], MD  DOB: Oct 06, 1955  DOS: 12/04/2021  Location: Office  MRN: [id number]  Setting: Ambulatory outpatient  Referring Provider: Tracie Harrier, MD  Type: Established Patient  Specialty: Interventional Pain Management  PCP: Tracie Harrier, MD  Location: Remote location  Delivery: TeleHealth     Virtual Encounter - Pain Management PROVIDER NOTE: Information contained herein reflects review and annotations entered in association with encounter. Interpretation of such information and data should be left to medically-trained personnel. Information provided to patient can be located elsewhere in the medical record under "Patient Instructions". Document created using STT-dictation technology, any transcriptional errors that may result from process are unintentional.    Contact & Pharmacy Preferred: [place]: [phone] (home) Mobile: [phone] (mobile) E-mail: [email or web address]  [id number] [place]  - [id number]: [phone]: [phone]  Pre-screening  Connie [name] offered "in-person" vs "virtual" encounter. She indicated preferring virtual for this encounter.   Reason COVID-19*  Social distancing based on CDC and AMA recommendations.   I contacted JMakesha BelitzLowder on 12/04/2021 via telephone.      I clearly identified myself as BGillis Santa MD. I verified that I was speaking with the correct person using two identifiers (Name: JNatilee [name] and date of birth: 628-[name]-1957.  Consent I sought verbal advanced consent from JNeolafor virtual visit interactions. I informed Connie [name] of possible security and privacy concerns, risks, and limitations associated with providing "not-in-person" medical evaluation and management services. I also informed Connie [name] of the availability of "in-person" appointments. Finally, I informed her that  there would be a charge for the virtual visit and that she could be  personally, fully or partially, financially responsible for it. Ms. LBebeauexpressed understanding and agreed to proceed.   Historic Elements   Ms. JLynleigh Kovackis a 66y.o. year old, female patient evaluated today after our last contact on 11/06/2021. Connie [name] has a past medical history of Anemia, Anxiety, Arthritis, Cervical spondylosis with myelopathy (2018), Chronic pain (2019), DDD (degenerative disc disease), lumbar, Depression, Displacement of cervical intervertebral disc (2018), Dyspnea, Fibromyalgia, GERD (gastroesophageal reflux disease), Heart murmur, Hypertension, Hypothyroidism, Lumbar post-laminectomy syndrome (2018), Lumbosacral radiculitis (2018), Neuropathy due to medical condition ([place], Osteoarthritis, Osteoporosis, Other long term (current) drug therapy (2019), Pain in the coccyx (2018), Spondylolisthesis (2018), Spondylosis of lumbosacral region without myelopathy or radiculopathy (2018), Spondylosis with myelopathy, lumbar region (2018), and Vitamin D deficiency. She also  has a past surgical history that includes Cesarean section; Gastric bypass (2004); Lumbar fusion (2016); Spinal cord stimulator implant (2018); Cholecystectomy; Appendectomy; Tonsillectomy; Shoulder arthroscopy with rotator cuff repair (Right, 2008); Joint replacement (Bilateral, 2005); Abdominal hysterectomy (2000); epidural injection (2018); arm surgery (2002); Breast biopsy (Left, 2005); Colonoscopy with propofol (N/A, 08/25/2017); Temporal artery biopsy / ligation; and Cataract extraction w/PHACO (Right, 10/21/2017). Ms. LAmreinhas a current medication list which includes the following prescription(s): azelastine, bupropion, celecoxib, hydrocortisone, levorphanol, levothyroxine, metoprolol succinate, pregabalin, trazodone, cyanocobalamin, bupropion, and [START ON 12/15/2021] levorphanol. She  reports that she quit smoking about 14 years ago. Her  smoking use included cigarettes. She has a 10.00 pack-year smoking history. She has never used smokeless tobacco. She reports that she does not drink alcohol and does not use drugs. Connie [name] allergic to pedi-pre tape spray [wound dressing adhesive].   HPI  Today, she is being contacted for a post-procedure assessment.  Post-procedure evaluation    Procedure:          Anesthesia, Analgesia, Anxiolysis:  Type: Therapeutic Epidural Steroid Injection #3 (#1 done 05/01/21, #2 07/29/21 ) Region: Caudal Level: Sacrococcygeal   Laterality: Midline       Anesthesia: Local (1-2% Lidocaine)  Anxiolysis: None  Sedation: None  Guidance: Fluoroscopy           Position: Prone   1. Lumbar radiculopathy   2. Spinal stenosis of lumbosacral region   3. Chronic pain syndrome    NAS-11 Pain score:   Pre-procedure: 9 /10   Post-procedure: 4 /10      Effectiveness:  Initial hour after procedure: 40 %  Subsequent 4-6 hours post-procedure: 40 %  Analgesia past initial 6 hours: 75 % (lasting a month)  Ongoing improvement:  Analgesic:  50% Function: Somewhat improved ROM: Somewhat improved   Pharmacotherapy Assessment   Opioid Analgesic: Percocet 10 mg TID prn MME=45   Monitoring: [place] PMP: PDMP reviewed during this encounter.       Pharmacotherapy: No side-effects or adverse reactions reported. Compliance: No problems identified. Effectiveness: Clinically acceptable. Plan: Refer to "POC". UDS:  Summary  Date Value Ref Range Status  09/25/2021 Note  Final    Comment:    ==================================================================== Tox[employer] Select 13 (MW) ==================================================================== Test                             Result       Flag       Units  Drug Present and Declared for Prescription Verification   Hydromorphone                  39           EXPECTED   ng/mg creat    Hydromorphone may be administered as a scheduled prescription     medication; it is also an expected metabolite of hydrocodone.  ==================================================================== Test                      Result    Flag   Units      Ref Range   Creatinine              140              mg/dL      >=20 ==================================================================== Declared Medications:  The flagging and interpretation on this report are based on the  following declared medications.  Unexpected results may arise from  inaccuracies in the declared medications.   **Note: The testing scope of this panel includes these medications:   Hydromorphone (Dilaudid)   **Note: The testing scope of this panel does not include the  following reported medications:   Azelastine (Astelin)  Bupropion (Wellbutrin)  Celecoxib (Celebrex)  Cyanocobalamin  Levorphanol  Levothyroxine (Synthroid)  Metoprolol (Toprol)  Pregabalin (Lyrica)  Trazodone (Desyrel) ==================================================================== For clinical consultation, please call [phone]. ====================================================================    No results found for: "CBDTHCR", "D8THCCBX", "D9THCCBX"   Laboratory Chemistry Profile   Renal Lab Results  Component Value Date   BUN 18 06/23/2017   CREATININE 0.60 06/23/2017   GFRAA >60 06/23/2017   GFRNONAA >60 06/23/2017    Hepatic Lab Results  Component Value Date   AST 20 06/23/2017   ALT 9 (L) 06/23/2017   ALBUMIN 3.6 06/23/2017   ALKPHOS 88 06/23/2017    Electrolytes Lab Results  Component Value Date   NA 135  06/23/2017   K 4.9 06/23/2017   CL 102 06/23/2017   CALCIUM 8.5 (L) 06/23/2017    Bone No results found for: "VD25OH", "VD125OH2TOT", "IC1798VS2", "VG8628OO1", "25OHVITD1", "25OHVITD2", "25OHVITD3", "TESTOFREE", "TESTOSTERONE"  Inflammation (CRP: Acute Phase) (ESR: Chronic Phase) No results found for: "CRP", "ESRSEDRATE", "LATICACIDVEN"       Note: Above Lab  results reviewed.   Assessment  The primary encounter diagnosis was Lumbar radiculopathy. Diagnoses of Spinal stenosis of lumbosacral region and Chronic pain syndrome were also pertinent to this visit.  Plan of Care  Good relief from caudal ESI.  Notes improvement in functional status.  Follow-up as scheduled for medication management next month.  Follow-up plan:   Return for Keep sch. appt.    Recent Visits Date Type Provider Dept  11/06/21 Procedure visit [name], MD [facility]  09/25/21 Office Visit [name], MD [facility]  Showing recent visits within past 90 days and meeting all other requirements Today's Visits Date Type Provider Dept  12/04/21 Office Visit [name], MD [facility]  Showing today's visits and meeting all other requirements Future Appointments Date Type Provider Dept  01/07/22 Appointment [name], MD [facility]  Showing future appointments within next 90 days and meeting all other requirements  I discussed the assessment and treatment plan with the patient. The patient was provided an opportunity to ask questions and all were answered. The patient agreed with the plan and demonstrated an understanding of the instructions.  Patient advised to call back or seek an in-person evaluation if the symptoms or condition worsens.  Duration of encounter: 17mnutes.  Note by: BGillis Santa MD Date: 12/04/2021; Time: 2:25 PM

## 2021-12-06 ENCOUNTER — Other Ambulatory Visit: Payer: Self-pay | Admitting: Family Medicine

## 2021-12-06 DIAGNOSIS — R441 Visual hallucinations: Secondary | ICD-10-CM

## 2021-12-09 ENCOUNTER — Encounter: Payer: Self-pay | Admitting: Student in an Organized Health Care Education/Training Program

## 2021-12-09 DIAGNOSIS — Z9689 Presence of other specified functional implants: Secondary | ICD-10-CM

## 2021-12-09 NOTE — Telephone Encounter (Signed)
Patient confirmed appt for 8/31 with Dr.Yarbrough.

## 2021-12-14 ENCOUNTER — Other Ambulatory Visit: Payer: Self-pay | Admitting: Student in an Organized Health Care Education/Training Program

## 2021-12-14 DIAGNOSIS — G894 Chronic pain syndrome: Secondary | ICD-10-CM

## 2021-12-14 DIAGNOSIS — M4807 Spinal stenosis, lumbosacral region: Secondary | ICD-10-CM

## 2021-12-14 DIAGNOSIS — M5416 Radiculopathy, lumbar region: Secondary | ICD-10-CM

## 2021-12-17 ENCOUNTER — Other Ambulatory Visit: Payer: Medicare Other

## 2021-12-17 ENCOUNTER — Telehealth: Payer: Self-pay | Admitting: Student in an Organized Health Care Education/Training Program

## 2021-12-17 NOTE — Telephone Encounter (Signed)
PT stated that she took her last pill on yesterday. Pt stated sh called the pharmacy to see if she had any refills to be pick up. PT was told she didn't have any. Please give patient a call. Thanks

## 2021-12-17 NOTE — Telephone Encounter (Signed)
Pt states pharmacy does not have a Rx for Levorphanol. [facility], [facility] for her to pick up.

## 2021-12-18 ENCOUNTER — Ambulatory Visit
Admission: RE | Admit: 2021-12-18 | Discharge: 2021-12-18 | Disposition: A | Discharge status: Home / Self Care | Payer: Medicare Other | Source: Ambulatory Visit | Attending: Family Medicine | Admitting: Family Medicine

## 2021-12-18 DIAGNOSIS — R202 Paresthesia of skin: Secondary | ICD-10-CM | POA: Diagnosis not present

## 2021-12-18 DIAGNOSIS — R441 Visual hallucinations: Secondary | ICD-10-CM | POA: Diagnosis present

## 2021-12-25 ENCOUNTER — Ambulatory Visit: Payer: Medicare Other | Admitting: Dermatology

## 2021-12-25 DIAGNOSIS — L409 Psoriasis, unspecified: Secondary | ICD-10-CM | POA: Diagnosis not present

## 2021-12-25 DIAGNOSIS — I872 Venous insufficiency (chronic) (peripheral): Secondary | ICD-10-CM

## 2021-12-25 DIAGNOSIS — L853 Xerosis cutis: Secondary | ICD-10-CM | POA: Diagnosis not present

## 2021-12-25 DIAGNOSIS — L821 Other seborrheic keratosis: Secondary | ICD-10-CM

## 2021-12-25 MED ORDER — MOMETASONE FUROATE 0.1 % EX CREA: TOPICAL_CREAM | CUTANEOUS | 1 refills | Status: DC

## 2021-12-25 NOTE — Patient Instructions (Addendum)
Stasis in the legs causes chronic leg swelling, which may result in itchy or painful rashes, skin discoloration, skin texture changes, and sometimes ulceration.  Recommend daily graduated compression hose/stockings- easiest to put on first thing in morning, remove at bedtime.  Elevate legs as much as possible. Avoid salt/sodium rich foods.  Recommend follow-up with primary care doctor to evaluate fluid in legs.  Start mometasone cream - apply to itchy rash on legs and scaly area on left elbow one to two times a day as needed for itch. Avoid face, groin, underarms. Topical steroids (such as triamcinolone, fluocinolone, fluocinonide, mometasone, clobetasol, halobetasol, betamethasone, hydrocortisone) can cause thinning and lightening of the skin if they are used for too long in the same area. Your physician has selected the right strength medicine for your problem and area affected on the body. Please use your medication only as directed by your physician to prevent side effects.   Gentle Skin Care Guide  1. Bathe no more than once a day.  2. Avoid bathing in hot water  3. Use a mild soap like Dove, Vanicream, Cetaphil, CeraVe. Can use Lever 2000 or Cetaphil antibacterial soap  4. Use soap only where you need it. On most days, use it under your arms, between your legs, and on your feet. Let the water rinse other areas unless visibly dirty.  5. When you get out of the bath/shower, use a towel to gently blot your skin dry, don't rub it.  6. While your skin is still a little damp, apply a moisturizing cream such as Vanicream, CeraVe, Cetaphil, Eucerin, Sarna lotion or plain Vaseline Jelly. For hands apply Neutrogena [place] Hand Cream or Excipial Hand Cream.  7. Reapply moisturizer any time you start to itch or feel dry.  8. Sometimes using free and clear laundry detergents can be helpful. Fabric softener sheets should be avoided. Downy Free & Gentle liquid, or any liquid fabric softener that is  free of dyes and perfumes, it acceptable to use  9. If your doctor has given you prescription creams you may apply moisturizers over them    Due to recent changes in healthcare laws, you may see results of your pathology and/or laboratory studies on MyChart before the doctors have had a chance to review them. We understand that in some cases there may be results that are confusing or concerning to you. Please understand that not all results are received at the same time and often the doctors may need to interpret multiple results in order to provide you with the best plan of care or course of treatment. Therefore, we ask that you please give [place] 2 business days to thoroughly review all your results before contacting the office for clarification. Should we see a critical lab result, you will be contacted sooner.   If You Need Anything After Your Visit  If you have any questions or concerns for your doctor, please call our main line at [phone] and press option 4 to reach your doctor's medical assistant. If no one answers, please leave a voicemail as directed and we will return your call as soon as possible. Messages left after 4 pm will be answered the following business day.   You may also send [place] a message via [place]. We typically respond to MyChart messages within 1-2 business days.  For prescription refills, please ask your pharmacy to contact our office. Our fax number is [phone].  If you have an urgent issue when the clinic is closed that cannot  wait until the next business day, you can page your doctor at the number below.    Please note that while we do our best to be available for urgent issues outside of office hours, we are not available 24/7.   If you have an urgent issue and are unable to reach [place], you may choose to seek medical care at your doctor's office, retail clinic, urgent care center, or emergency room.  If you have a medical emergency, please immediately call 911  or go to the emergency department.  Pager Numbers  - Dr. Nehemiah Massed: [phone]  - Dr. Laurence Ferrari: [phone]  - Dr. Nicole Kindred: [phone]  In the event of inclement weather, please call our main line at [phone] for an update on the status of any delays or closures.  Dermatology Medication Tips: Please keep the boxes that topical medications come in in order to help keep track of the instructions about where and how to use these. Pharmacies typically print the medication instructions only on the boxes and not directly on the medication tubes.   If your medication is too expensive, please contact our office at [phone] option 4 or send [place] a message through [place].   We are unable to tell what your co-pay for medications will be in advance as this is different depending on your insurance coverage. However, we may be able to find a substitute medication at lower cost or fill out paperwork to get insurance to cover a needed medication.   If a prior authorization is required to get your medication covered by your insurance company, please allow [place] 1-2 business days to complete this process.  Drug prices often vary depending on where the prescription is filled and some pharmacies may offer cheaper prices.  The website www.goodrx.com contains coupons for medications through different pharmacies. The prices here do not account for what the cost may be with help from insurance (it may be cheaper with your insurance), but the website can give you the price if you did not use any insurance.  - You can print the associated coupon and take it with your prescription to the pharmacy.  - You may also stop by our office during regular business hours and pick up a GoodRx coupon card.  - If you need your prescription sent electronically to a different pharmacy, notify our office through [facility] or by phone at [phone] option 4.     Si Usted Necesita Algo Despus de Su  Visita  Tambin puede enviarnos un mensaje a travs de Pharmacist, community. Por lo general respondemos a los mensajes de MyChart en el transcurso de 1 a 2 das hbiles.  Para renovar recetas, por favor pida a su farmacia que se ponga en contacto con nuestra oficina. Harland Dingwall de fax es [place] [phone].  Si tiene un asunto urgente cuando la clnica est cerrada y que no puede esperar hasta el siguiente da hbil, puede llamar/localizar a su doctor(a) al nmero que aparece a continuacin.   Por favor, tenga en cuenta que aunque hacemos todo lo posible para estar disponibles para asuntos urgentes fuera del horario de [place], no estamos disponibles las 24 horas del da, los 7 das de la [place].   Si tiene un problema urgente y no puede comunicarse con nosotros, puede optar por buscar atencin mdica  en el consultorio de su doctor(a), en una clnica privada, en un centro de atencin urgente o en una sala de emergencias.  Si tiene [employer], por favor llame  inmediatamente al 911 o vaya a la sala de Multimedia programmer.  Nmeros de bper  - Dr. Nehemiah Massed: [phone]  - Dra. Moye: [phone]  - Dra. Nicole Kindred: [phone]  En caso de inclemencias del [place], por favor llame a Johnsie Kindred principal al [phone] para una actualizacin sobre el [place] de cualquier retraso o cierre.  Consejos para la medicacin en dermatologa: Por favor, guarde las cajas en las que vienen los medicamentos de uso tpico para ayudarle a seguir las instrucciones sobre dnde y cmo usarlos. Las farmacias generalmente imprimen las instrucciones del medicamento slo en las cajas y no directamente en los tubos del [place].   Si su medicamento es muy caro, por favor, pngase en contacto con Zigmund Daniel llamando al [phone] y presione la opcin 4 o envenos un mensaje a travs de Pharmacist, community.   No podemos decirle cul ser su copago por los medicamentos por adelantado ya que esto es diferente dependiendo de la  cobertura de su seguro. Sin embargo, es posible que podamos encontrar un medicamento sustituto a Electrical engineer un formulario para que el seguro cubra el medicamento que se considera necesario.   Si se requiere una autorizacin previa para que su compaa de seguros [place] su medicamento, por favor permtanos de 1 a 2 das hbiles para completar este proceso.  Los precios de los medicamentos varan con frecuencia dependiendo del Environmental consultant de dnde se surte la receta y alguna farmacias pueden ofrecer precios ms baratos.  El sitio web www.goodrx.com tiene cupones para medicamentos de Airline pilot. Los precios aqu no tienen en cuenta lo que podra costar con la ayuda del seguro (puede ser ms barato con su seguro), pero el sitio web puede darle el precio si no utiliz Research scientist (physical sciences).  - Puede imprimir el cupn correspondiente y llevarlo con su receta a la farmacia.  - Tambin puede pasar por nuestra oficina durante el horario de atencin regular y Charity fundraiser una tarjeta de cupones de GoodRx.  - Si necesita que su receta se enve electrnicamente a una farmacia diferente, informe a nuestra oficina a travs de MyChart de [place] o por telfono llamando al [phone] y presione la opcin 4.

## 2021-12-25 NOTE — Progress Notes (Unsigned)
Referring Physician:  [name], [place],  [place] [address]  Primary Physician:  Tracie Harrier, MD  History of Present Illness: 12/26/2021 Connie [name] is here today with a chief complaint of  wanting her spinal cord stimulator removed.   She had a lumbar fusion in 2016.  She had her spinal cord stimulator laced in 2018.  She would now like to have it removed because is not working.   Review of Systems:  A 10 point review of systems is negative, except for the pertinent positives and negatives detailed in the HPI.  Past Medical History: Past Medical History:  Diagnosis Date   Anemia    in the past   Anxiety    Arthritis    all over   Cervical spondylosis with myelopathy 2018   Chronic pain 2019   can't stand straight, uses walker for stability   DDD (degenerative disc disease), lumbar    Depression    Displacement of cervical intervertebral disc 2018   Dyspnea    HANDICAPPED   Fibromyalgia    Polymyalgia Rheumatica   GERD (gastroesophageal reflux disease)    Heart murmur    not treated or being followed   Hypertension    CONTROLLED   Hypothyroidism    Lumbar post-laminectomy syndrome 2018   Lumbosacral radiculitis 2018   Neuropathy due to medical condition ([place])    feet, toes, fingers   Osteoarthritis    Osteoporosis    Other long term (current) drug therapy 2019   from pain management   Pain in the coccyx 2018   disorder of sacrum   Spondylolisthesis 2018   Spondylosis of lumbosacral region without myelopathy or radiculopathy 2018   Spondylosis with myelopathy, lumbar region 2018   Vitamin D deficiency     Past Surgical History: Past Surgical History:  Procedure Laterality Date   ABDOMINAL HYSTERECTOMY  2000   APPENDECTOMY     arm surgery  2002   BREAST BIOPSY Left 2005   benign   CATARACT EXTRACTION W/PHACO Right 10/21/2017   Procedure: CATARACT EXTRACTION PHACO AND INTRAOCULAR LENS PLACEMENT ([place]) RIGHT;   Surgeon: Leandrew Koyanagi, MD;  Location: [facility];  Service: Ophthalmology;  Laterality: Right;  neck issues   CESAREAN SECTION     x 2   CHOLECYSTECTOMY     COLONOSCOPY WITH PROPOFOL N/A 08/25/2017   Procedure: COLONOSCOPY WITH PROPOFOL;  Surgeon: Toledo, Benay Pike, MD;  Location: [facility];  Service: Gastroenterology;  Laterality: N/A;   epidural injection  2018   steroids   GASTRIC BYPASS  2004   JOINT REPLACEMENT Bilateral 2005   knees   LUMBAR FUSION  2016   x 2/ L3-5   SHOULDER ARTHROSCOPY WITH ROTATOR CUFF REPAIR Right 2008   x 3   SPINAL CORD STIMULATOR IMPLANT  2018   [employer]   TEMPORAL ARTERY BIOPSY / LIGATION     TONSILLECTOMY      Allergies: Allergies as of 12/26/2021 - Review Complete 12/26/2021  Allergen Reaction Noted   Pedi-pre tape spray [wound dressing adhesive] Rash 11/06/2021    Medications: No outpatient medications have been marked as taking for the 12/26/21 encounter (Office Visit) with Meade Maw, MD.    Social History: Social History   Tobacco Use   Smoking status: Former    Packs/day: 0.50    Years: 20.00    Total pack years: 10.00    Types: Cigarettes    Quit date: 05/21/2007  Years since quitting: 14.6   Smokeless tobacco: Never  Vaping Use   Vaping Use: Never used  Substance Use Topics   Alcohol use: No   Drug use: No    Comment: managed by pain clinic    Family Medical History: Family History  Problem Relation Age of Onset   Kidney disease Mother    Cancer Mother    Anxiety disorder Mother    Depression Mother    Breast cancer Mother    Cancer Father    Hypertension Father    Cancer Brother    Breast cancer Maternal Aunt    Breast cancer Cousin     Physical Examination: Vitals:   12/26/21 1518  BP: (!) 165/75  Pulse: 63    General: Patient is well developed, well nourished, calm, collected, and in no apparent distress. Attention to examination is appropriate.  Neck:   Supple.     Respiratory: Patient is breathing without any difficulty.   NEUROLOGICAL:     Awake, alert, oriented to person, place, and time.  Speech is clear and fluent. Fund of knowledge is appropriate.   Cranial Nerves: Pupils equal round and reactive to light.  Facial tone is symmetric.  Facial sensation is symmetric. Shoulder shrug is symmetric. Tongue protrusion is midline.  There is no pronator drift.  ROM of spine: full.    Strength: Side Biceps Triceps Deltoid Interossei Grip Wrist Ext. Wrist Flex.  R '5 5 5 5 5 5 5  '$ L '5 5 5 5 5 5 5   '$ Side Iliopsoas Quads Hamstring PF DF EHL  R '5 5 5 5 5 5  '$ L '5 5 5 5 5 5   '$ Reflexes are 1+ and symmetric at the biceps, triceps, brachioradialis, patella and achilles.   Hoffman's is absent.    Gait is abnormal and requires walker .     Medical Decision Making  Imaging: L spine CT reviewed  I have personally reviewed the images and agree with the above interpretation.  Assessment and Plan: [name] is a pleasant 66 y.o. female with nonfunctional spinal cord stimulator.  She would like it removed.  We discussed that elective spine surgery is normally performed when the patient is below a BMI of 40.  We have set a goal weight of 235 to allow her to have removal of this device.  It would require a formal thoracic laminectomy most likely to remove the paddle.  Contact her primary care provider to see if this will help determine a plan for her to meet this body weight goal.   I spent a total of 30 minutes in face-to-face and non-face-to-face activities related to this patient's care today.  Thank you for involving me in the care of this patient.      Azam Gervasi K. Izora Ribas MD, [facility]

## 2021-12-25 NOTE — Progress Notes (Unsigned)
Follow-Up Visit   Subjective  Connie [name] is a 66 y.o. female who presents for the following: Xerosis (Face, legs. Some improvement since last visit, not as itchy on face. She is using hydrocortisone 2.5% cream, Sarna lotion, and CeraVe Itch Relief. Lower legs have worsened a little.). Patient has swelling of the lower legs, not currently on diuretic. Patient was prescribed permethrin cream from PCP 2 weeks ago, but no improvement with rash. Husband not itchy.  Patient accompanied by husband.   The following portions of the chart were reviewed this encounter and updated as appropriate:       Review of Systems:  No other skin or systemic complaints except as noted in HPI or Assessment and Plan.  Objective  Well appearing patient in no apparent distress; mood and affect are within normal limits.  A focused examination was performed including face, legs. Relevant physical exam findings are noted in the Assessment and Plan.  lower legs Woody, pitting edema of the lower legs; mild erythema of the lower legs, ankles  Left Elbow Well-demarcated erythematous papules/plaques with silvery scale.     Assessment & Plan  Seborrheic Keratoses - Stuck-on, waxy, tan-brown papules and/or plaques of the lower legs - Benign-appearing - Discussed benign etiology and prognosis. - Observe - Call for any changes  Xerosis - diffuse xerotic patches - recommend gentle, hydrating skin care - gentle skin care handout given - continue hydrocortisone 2.5% cream itchy areas on face/body qd/bid prn.  Venous stasis dermatitis of right lower extremity  Stasis dermatitis of both legs lower legs  With pruritus  Stasis in the legs causes chronic leg swelling, which may result in itchy or painful rashes, skin discoloration, skin texture changes, and sometimes ulceration.  Recommend daily graduated compression hose/stockings- easiest to put on first thing in morning, remove at bedtime.  Elevate legs  as much as possible. Avoid salt/sodium rich foods.  Thyroid, CBC, CMP Labs reviewed from 12/06/2021 - wnl  Patient to discuss swelling of the legs with PCP. Patient has been treated with diuretic in the past.   Start mometasone cream Apply to lower legs BID prn itch dsp 45g 1Rf. Topical steroids (such as triamcinolone, fluocinolone, fluocinonide, mometasone, clobetasol, halobetasol, betamethasone, hydrocortisone) can cause thinning and lightening of the skin if they are used for too long in the same area. Your physician has selected the right strength medicine for your problem and area affected on the body. Please use your medication only as directed by your physician to prevent side effects.   Recommend mild soap and moisturizing cream 1-2 times daily.  Gentle skin care handout provided.     mometasone (ELOCON) 0.1 % cream - lower legs Apply to affected areas rash on legs and left elbow once to twice daily as needed for itch. Avoid face, groin, underarms.  Psoriasis Left Elbow  Psoriasis is a chronic non-curable, but treatable genetic/hereditary disease that may have other systemic features affecting other organ systems such as joints (Psoriatic Arthritis). It is associated with an increased risk of inflammatory bowel disease, heart disease, non-alcoholic fatty liver disease, and depression.    Xerosis - dry skin- pt states itching has improved with dry skin care.  Just itching on lower legs now. - recommend gentle, hydrating skin care - gentle skin care handout given   Return if symptoms worsen or fail to improve.  Lindi Adie, CMA, am acting as scribe for Brendolyn Patty, MD .  Documentation: I have reviewed the above documentation for  accuracy and completeness, and I agree with the above.  Brendolyn Patty MD

## 2021-12-26 ENCOUNTER — Encounter: Payer: Self-pay | Admitting: Neurosurgery

## 2021-12-26 ENCOUNTER — Ambulatory Visit (INDEPENDENT_AMBULATORY_CARE_PROVIDER_SITE_OTHER): Payer: Medicare Other | Admitting: Neurosurgery

## 2021-12-26 VITALS — BP 165/75 | HR 63 | Ht 60.0 in | Wt 260.8 lb

## 2021-12-26 DIAGNOSIS — M5416 Radiculopathy, lumbar region: Secondary | ICD-10-CM | POA: Diagnosis not present

## 2021-12-26 DIAGNOSIS — Z981 Arthrodesis status: Secondary | ICD-10-CM | POA: Diagnosis not present

## 2021-12-26 DIAGNOSIS — M4807 Spinal stenosis, lumbosacral region: Secondary | ICD-10-CM | POA: Diagnosis not present

## 2021-12-26 DIAGNOSIS — T85192S Other mechanical complication of implanted electronic neurostimulator (electrode) of spinal cord, sequela: Secondary | ICD-10-CM | POA: Diagnosis not present

## 2021-12-26 DIAGNOSIS — Z09 Encounter for follow-up examination after completed treatment for conditions other than malignant neoplasm: Secondary | ICD-10-CM

## 2022-01-07 ENCOUNTER — Other Ambulatory Visit: Payer: Self-pay

## 2022-01-07 ENCOUNTER — Ambulatory Visit
Payer: Medicare Other | Attending: Student in an Organized Health Care Education/Training Program | Admitting: Student in an Organized Health Care Education/Training Program

## 2022-01-07 ENCOUNTER — Encounter: Payer: Self-pay | Admitting: Student in an Organized Health Care Education/Training Program

## 2022-01-07 VITALS — BP 141/62 | HR 66 | Temp 97.3°F | Resp 16 | Ht 60.0 in | Wt 260.0 lb

## 2022-01-07 DIAGNOSIS — M5416 Radiculopathy, lumbar region: Secondary | ICD-10-CM | POA: Diagnosis not present

## 2022-01-07 DIAGNOSIS — M5136 Other intervertebral disc degeneration, lumbar region: Secondary | ICD-10-CM | POA: Diagnosis present

## 2022-01-07 DIAGNOSIS — G894 Chronic pain syndrome: Secondary | ICD-10-CM | POA: Insufficient documentation

## 2022-01-07 DIAGNOSIS — Z9689 Presence of other specified functional implants: Secondary | ICD-10-CM | POA: Insufficient documentation

## 2022-01-07 DIAGNOSIS — M4807 Spinal stenosis, lumbosacral region: Secondary | ICD-10-CM | POA: Diagnosis present

## 2022-01-07 DIAGNOSIS — M47812 Spondylosis without myelopathy or radiculopathy, cervical region: Secondary | ICD-10-CM | POA: Insufficient documentation

## 2022-01-07 MED ORDER — LEVORPHANOL TARTRATE 2 MG PO TABS: 2.0000 mg | ORAL_TABLET | Freq: Three times a day (TID) | ORAL | 0 refills | Status: DC | PRN

## 2022-01-07 NOTE — Progress Notes (Signed)
Nursing Pain Medication Assessment:  Safety precautions to be maintained throughout the outpatient stay will include: orient to surroundings, keep bed in low position, maintain call bell within reach at all times, provide assistance with transfer out of bed and ambulation.  Medication Inspection Compliance: Pill count conducted under aseptic conditions, in front of the patient. Neither the pills nor the bottle was removed from the patient's sight at any time. Once count was completed pills were immediately returned to the patient in their original bottle.  Medication:  levorphanol Pill/Patch Count:  30 of 90 pills remain Pill/Patch Appearance: Markings consistent with prescribed medication Bottle Appearance: Standard pharmacy container. Clearly labeled. Filled Date: 08 / 22 / 2023 Last Medication intake:  Today

## 2022-01-07 NOTE — Progress Notes (Signed)
PROVIDER NOTE: Information contained herein reflects review and annotations entered in association with encounter. Interpretation of such information and data should be left to medically-trained personnel. Information provided to patient can be located elsewhere in the medical record under "Patient Instructions". Document created using STT-dictation technology, any transcriptional errors that may result from process are unintentional.    Patient: Connie [name]  Service Category: E/M  Provider: [name], MD  DOB: 02-03-1956  DOS: 01/07/2022  Specialty: Interventional Pain Management  MRN: [id number]  Setting: Ambulatory outpatient  PCP: Tracie Harrier, MD  Type: Established Patient    Referring Provider: Tracie Harrier, MD  Location: Office  Delivery: Face-to-face     HPI  [name], a 66 y.o. year old female, is here today because of her Spinal cord stimulator status [Z96.89]. [name] primary complain today is Back Pain (Lumbar bilateral ) Last encounter: My last encounter with her was on 12/04/21 Pertinent problems: [name] has Degenerative joint disease of cervical and lumbar spine; Lumbar spondylosis; History of lumbar fusion; Lumbar degenerative disc disease; Fibromyalgia; Chronic pain syndrome; Spinal cord stimulator status; Acquired spondylolisthesis; Chronic pain; Generalized osteoarthritis of multiple sites; Spinal stenosis of lumbar region without neurogenic claudication; and Lumbar post-laminectomy syndrome on their pertinent problem list. Pain Assessment: Severity of Chronic pain is reported as a 5  (when she stands up the pain becomes greater and she rates it at a 9)/10. Location: Back Lower, Left, Right/down the left leg. Onset: More than a month ago. Quality: Discomfort, Constant, Other (Comment) (electrical type pain). Timing: Constant. Modifying factor(s): sitting down eases the pain off.  frequent position changes.  medications. Vitals:  height is 5' (1.524  m) and weight is 260 lb (117.9 kg). Her temporal temperature is 97.3 F (36.3 C) (abnormal). Her blood pressure is 141/62 (abnormal) and her pulse is 66. Her respiration is 16 and oxygen saturation is 95%.   Reason for encounter: medication management.   Connie [name] follows up today for medication management of her levorphanol.   No change in medical history since last visit.  Patient's pain is at baseline.  Patient continues multimodal pain regimen as prescribed.  States that it provides pain relief and improvement in functional status. She did receive vaccines in bilateral arms and is noting some redness and discoloration of bilateral deltoid which she states is improving.  I instructed her to continue to monitor and as long as the redness is getting smaller in size that it should resolve on its own.  Pharmacotherapy Assessment  Analgesic: Levorphanol 2 mg BID  prn, #90/month    Monitoring: [place] PMP: PDMP reviewed during this encounter.       Pharmacotherapy: No side-effects or adverse reactions reported. Compliance: No problems identified. Effectiveness: Clinically acceptable.  Connie Billow, RN  01/07/2022  1:11 PM  Sign when Signing Visit Nursing Pain Medication Assessment:  Safety precautions to be maintained throughout the outpatient stay will include: orient to surroundings, keep bed in low position, maintain call bell within reach at all times, provide assistance with transfer out of bed and ambulation.  Medication Inspection Compliance: Pill count conducted under aseptic conditions, in front of the patient. Neither the pills nor the bottle was removed from the patient's sight at any time. Once count was completed pills were immediately returned to the patient in their original bottle.  Medication:  levorphanol Pill/Patch Count:  30 of 90 pills remain Pill/Patch Appearance: Markings consistent with prescribed medication Bottle Appearance: Standard pharmacy container. Clearly  labeled. Filled Date: 08 / 22 / 2023 Last Medication intake:  Today    UDS:  Summary  Date Value Ref Range Status  09/25/2021 Note  Final    Comment:    ==================================================================== [employer] Select 13 (MW) ==================================================================== Test                             Result       Flag       Units  Drug Present and Declared for Prescription Verification   Hydromorphone                  39           EXPECTED   ng/mg creat    Hydromorphone may be administered as a scheduled prescription    medication; it is also an expected metabolite of hydrocodone.  ==================================================================== Test                      Result    Flag   Units      Ref Range   Creatinine              140              mg/dL      >=20 ==================================================================== Declared Medications:  The flagging and interpretation on this report are based on the  following declared medications.  Unexpected results may arise from  inaccuracies in the declared medications.   **Note: The testing scope of this panel includes these medications:   Hydromorphone (Dilaudid)   **Note: The testing scope of this panel does not include the  following reported medications:   Azelastine (Astelin)  Bupropion (Wellbutrin)  Celecoxib (Celebrex)  Cyanocobalamin  Levorphanol  Levothyroxine (Synthroid)  Metoprolol (Toprol)  Pregabalin (Lyrica)  Trazodone (Desyrel) ==================================================================== For clinical consultation, please call [phone]. ====================================================================      ROS  Constitutional: Denies any fever or chills Gastrointestinal: No reported hemesis, hematochezia, vomiting, or acute GI distress Musculoskeletal:  Low back, bilateral leg pain Neurological: No reported episodes of acute  onset apraxia, aphasia, dysarthria, agnosia, amnesia, paralysis, loss of coordination, or loss of consciousness  Medication Review  QUEtiapine, azelastine, buPROPion, cyanocobalamin, furosemide, hydrocortisone, levorphanol, levothyroxine, metoprolol succinate, mometasone, omeprazole, pramipexole, pregabalin, and traZODone  History Review  Allergy: [name] is allergic to pedi-pre tape spray [wound dressing adhesive]. Drug: [name]  reports no history of drug use. Alcohol:  reports no history of alcohol use. Tobacco:  reports that she quit smoking about 14 years ago. Her smoking use included cigarettes. She has a 10.00 pack-year smoking history. She has never used smokeless tobacco. Social: [name]  reports that she quit smoking about 14 years ago. Her smoking use included cigarettes. She has a 10.00 pack-year smoking history. She has never used smokeless tobacco. She reports that she does not drink alcohol and does not use drugs. Medical:  has a past medical history of Anemia, Anxiety, Arthritis, Cervical spondylosis with myelopathy (2018), Chronic pain (2019), DDD (degenerative disc disease), lumbar, Depression, Displacement of cervical intervertebral disc (2018), Dyspnea, Fibromyalgia, GERD (gastroesophageal reflux disease), Heart murmur, Hypertension, Hypothyroidism, Lumbar post-laminectomy syndrome (2018), Lumbosacral radiculitis (2018), Neuropathy due to medical condition ([place]), Osteoarthritis, Osteoporosis, Other long term (current) drug therapy (2019), Pain in the coccyx (2018), Spondylolisthesis (2018), Spondylosis of lumbosacral region without myelopathy or radiculopathy (2018), Spondylosis with myelopathy, lumbar region (2018), and Vitamin D deficiency. Surgical: [name]  has a past surgical history that includes Cesarean section; Gastric bypass (2004); Lumbar fusion (2016); Spinal cord stimulator implant (2018); Cholecystectomy; Appendectomy; Tonsillectomy; Shoulder arthroscopy with  rotator cuff repair (Right, 2008); Joint replacement (Bilateral, 2005); Abdominal hysterectomy (2000); epidural injection (2018); arm surgery (2002); Breast biopsy (Left, 2005); Colonoscopy with propofol (N/A, 08/25/2017); Temporal artery biopsy / ligation; and Cataract extraction w/PHACO (Right, 10/21/2017). Family: family history includes Anxiety disorder in her mother; Breast cancer in her cousin, maternal aunt, and mother; Cancer in her brother, father, and mother; Depression in her mother; Hypertension in her father; Kidney disease in her mother.  Laboratory Chemistry Profile   Renal Lab Results  Component Value Date   BUN 18 06/23/2017   CREATININE 0.60 06/23/2017   GFRAA >60 06/23/2017   GFRNONAA >60 06/23/2017    Hepatic Lab Results  Component Value Date   AST 20 06/23/2017   ALT 9 (L) 06/23/2017   ALBUMIN 3.6 06/23/2017   ALKPHOS 88 06/23/2017    Electrolytes Lab Results  Component Value Date   NA 135 06/23/2017   K 4.9 06/23/2017   CL 102 06/23/2017   CALCIUM 8.5 (L) 06/23/2017    Bone No results found for: "VD25OH", "VD125OH2TOT", "TH4388IL5", "ZV7282SU0", "25OHVITD1", "25OHVITD2", "25OHVITD3", "TESTOFREE", "TESTOSTERONE"  Inflammation (CRP: Acute Phase) (ESR: Chronic Phase) No results found for: "CRP", "ESRSEDRATE", "LATICACIDVEN"       Note: Above Lab results reviewed.  Recent Imaging Review  CT HEAD WO CONTRAST (5MM) CLINICAL DATA:  Visual hallucinations  EXAM: CT HEAD WITHOUT CONTRAST  TECHNIQUE: Contiguous axial images were obtained from the base of the skull through the vertex without intravenous contrast.  RADIATION DOSE REDUCTION: This exam was performed according to the departmental dose-optimization program which includes automated exposure control, adjustment of the mA and/or kV according to patient size and/or use of iterative reconstruction technique.  COMPARISON:  June 22, 2019  FINDINGS: Brain: No evidence of acute infarction,  hemorrhage, hydrocephalus, extra-axial collection or mass lesion/mass effect.  Vascular: No hyperdense vessel or unexpected calcification.  Skull: Normal. Negative for fracture or focal lesion.  Sinuses/Orbits: No acute finding.  Other: None.  IMPRESSION: No acute intracranial abnormality.  Electronically Signed   By: Beryle Flock M.D.   On: 12/18/2021 15:39 Note: Reviewed        Physical Exam  General appearance: Well nourished, well developed, and well hydrated. In no apparent acute distress Mental status: Alert, oriented x 3 (person, place, & time)       Respiratory: No evidence of acute respiratory distress Eyes: PERLA Vitals: BP (!) 141/62 (BP Location: Right Arm, Patient Position: Sitting, Cuff Size: Normal) Comment (Cuff Size): forearm  Pulse 66   Temp (!) 97.3 F (36.3 C) (Temporal)   Resp 16   Ht 5' (1.524 m)   Wt 260 lb (117.9 kg)   SpO2 95%   BMI 50.78 kg/m  BMI: Estimated body mass index is 50.78 kg/m as calculated from the following:   Height as of this encounter: 5' (1.524 m).   Weight as of this encounter: 260 lb (117.9 kg). Ideal: Ideal body weight: 45.5 kg (100 lb 4.9 oz) Adjusted ideal body weight: 74.5 kg (164 lb 3 oz)  Assessment   Diagnosis Status  1. Spinal cord stimulator status   2. Lumbar radiculopathy   3. Spinal stenosis of lumbosacral region   4. Lumbar degenerative disc disease   5. Spondylosis of cervical region without myelopathy or radiculopathy   6. Chronic pain syndrome  Persistent Persistent Persistent    Plan of Care  1. Spinal cord stimulator status  2. Lumbar radiculopathy - levorphanol (LEVODROMORAN) 2 MG tablet; Take 1 tablet (2 mg total) by mouth every 8 (eight) hours as needed for pain.  Dispense: 90 tablet; Refill: 0 - levorphanol (LEVODROMORAN) 2 MG tablet; Take 1 tablet (2 mg total) by mouth every 8 (eight) hours as needed for pain.  Dispense: 90 tablet; Refill: 0  3. Spinal stenosis of lumbosacral  region - levorphanol (LEVODROMORAN) 2 MG tablet; Take 1 tablet (2 mg total) by mouth every 8 (eight) hours as needed for pain.  Dispense: 90 tablet; Refill: 0 - levorphanol (LEVODROMORAN) 2 MG tablet; Take 1 tablet (2 mg total) by mouth every 8 (eight) hours as needed for pain.  Dispense: 90 tablet; Refill: 0  4. Lumbar degenerative disc disease  5. Spondylosis of cervical region without myelopathy or radiculopathy  6. Chronic pain syndrome - levorphanol (LEVODROMORAN) 2 MG tablet; Take 1 tablet (2 mg total) by mouth every 8 (eight) hours as needed for pain.  Dispense: 90 tablet; Refill: 0 - levorphanol (LEVODROMORAN) 2 MG tablet; Take 1 tablet (2 mg total) by mouth every 8 (eight) hours as needed for pain.  Dispense: 90 tablet; Refill: 0 - [employer] Select 13 (MW), Urine  [name] has a current medication list which includes the following long-term medication(s): azelastine, bupropion, levothyroxine, metoprolol succinate, omeprazole, pramipexole, pregabalin, trazodone, and quetiapine.  Pharmacotherapy (Medications Ordered): Meds ordered this encounter  Medications   levorphanol (LEVODROMORAN) 2 MG tablet    Sig: Take 1 tablet (2 mg total) by mouth every 8 (eight) hours as needed for pain.    Dispense:  90 tablet    Refill:  0   levorphanol (LEVODROMORAN) 2 MG tablet    Sig: Take 1 tablet (2 mg total) by mouth every 8 (eight) hours as needed for pain.    Dispense:  90 tablet    Refill:  0   Orders:  Orders Placed This Encounter  Procedures   [employer] Select 13 (MW), Urine    Volume: 30 ml(s). Minimum 3 ml of urine is needed. Document temperature of fresh sample. Indications: Long term (current) use of opiate analgesic [phone])    Order Specific Question:   Release to patient    Answer:   Immediate   Follow-up plan:   Return in about 7 weeks (around 02/27/2022) for Medication Management, in person.    Recent Visits Date Type Provider Dept  12/04/21 Office Visit  [name], MD [facility]  11/06/21 Procedure visit [name], MD [facility]  Showing recent visits within past 90 days and meeting all other requirements Today's Visits Date Type Provider Dept  01/07/22 Office Visit [name], MD [facility]  Showing today's visits and meeting all other requirements Future Appointments Date Type Provider Dept  03/11/22 Appointment [name], MD [facility]  Showing future appointments within next 90 days and meeting all other requirements  I discussed the assessment and treatment plan with the patient. The patient was provided an opportunity to ask questions and all were answered. The patient agreed with the plan and demonstrated an understanding of the instructions.  Patient advised to call back or seek an in-person evaluation if the symptoms or condition worsens.  Duration of encounter: 78mnutes.  Note by: BGillis Santa MD Date: 01/07/2022; Time: 2:48 PM

## 2022-01-09 LAB — TOXASSURE SELECT 13 (MW), URINE

## 2022-01-16 ENCOUNTER — Ambulatory Visit
Admission: RE | Admit: 2022-01-16 | Discharge: 2022-01-16 | Disposition: A | Discharge status: Home / Self Care | Payer: Medicare Other | Source: Ambulatory Visit | Attending: Psychiatry | Admitting: Psychiatry

## 2022-01-16 ENCOUNTER — Encounter: Payer: Self-pay | Admitting: Psychiatry

## 2022-01-16 ENCOUNTER — Ambulatory Visit (INDEPENDENT_AMBULATORY_CARE_PROVIDER_SITE_OTHER): Payer: Medicare Other | Admitting: Psychiatry

## 2022-01-16 VITALS — BP 108/67 | Temp 97.6°F | Ht 60.0 in | Wt 253.0 lb

## 2022-01-16 DIAGNOSIS — F331 Major depressive disorder, recurrent, moderate: Secondary | ICD-10-CM | POA: Diagnosis present

## 2022-01-16 DIAGNOSIS — F29 Unspecified psychosis not due to a substance or known physiological condition: Secondary | ICD-10-CM | POA: Insufficient documentation

## 2022-01-16 DIAGNOSIS — F411 Generalized anxiety disorder: Secondary | ICD-10-CM

## 2022-01-16 DIAGNOSIS — R9431 Abnormal electrocardiogram [ECG] [EKG]: Secondary | ICD-10-CM | POA: Insufficient documentation

## 2022-01-16 DIAGNOSIS — Z9189 Other specified personal risk factors, not elsewhere classified: Secondary | ICD-10-CM | POA: Insufficient documentation

## 2022-01-16 DIAGNOSIS — F5105 Insomnia due to other mental disorder: Secondary | ICD-10-CM

## 2022-01-16 MED ORDER — PRAMIPEXOLE DIHYDROCHLORIDE 0.5 MG PO TABS: 0.5000 mg | ORAL_TABLET | Freq: Two times a day (BID) | ORAL | 0 refills | Status: DC

## 2022-01-16 MED ORDER — QUETIAPINE FUMARATE 50 MG PO TABS: 50.0000 mg | ORAL_TABLET | Freq: Every day | ORAL | 0 refills | Status: DC

## 2022-01-16 MED ORDER — TRAZODONE HCL 150 MG PO TABS: 75.0000 mg | ORAL_TABLET | Freq: Every evening | ORAL | 1 refills | Status: DC | PRN

## 2022-01-16 MED ORDER — PRAMIPEXOLE DIHYDROCHLORIDE 0.5 MG PO TABS: 0.5000 mg | ORAL_TABLET | Freq: Every day | ORAL | 0 refills | Status: DC

## 2022-01-16 NOTE — Progress Notes (Signed)
[facility] OP Progress Note  01/16/2022 1:01 PM Connie [name]  MRN:  [id number]  Chief Complaint:  Chief Complaint  Patient presents with   Follow-up   Medication Problem   Hallucinations   HPI: Connie [name] is a 66 year old Caucasian female, married, on SSD, lives in [place], has a history of depression, anxiety, fibromyalgia, obstructive sleep apnea on CPAP, hypothyroidism, polyarthralgia, presented for medication management with onset of hallucination.  Patient's last visit with writer was on 04/11/2020.  Patient no showed an appointment on 06/11/2020 and never followed through with recommendations for follow-up and medication management after that.  Patient today returns to reestablish care.  Patient appeared to be a limited historian.  Collateral information hence was obtained from spouse-Connie [name].  I have also reviewed information from primary care provider's notes-Connie [name] - most recent 12/26/2021-patient had CT scan of her brain which was stable.  Patient unable to get MRI.  She was referred for psychiatric evaluation at that visit.  I have also reviewed notes per Connie [name] 12/06/2021-neurology-patient at that visit was advised to taper off pramipexole and she was advised to continue Lyrica 150 mg.  Patient however today returns reporting she continues to be on the pramipexole as prescribed.  She has not followed instructions to taper it off.   Patient reports her hallucinations may have started in May or April 2023.  Patient reports seeing black specks in her food.  She also reports a sensation of something crawling on her skin.  Patient reports although she has these hallucinations she has been making sure she eats .  Patient reports since being on the Seroquel she has been able to better cope with her hallucinations than before.  She currently denies any significant depression symptoms.  Does have anxiety about her hallucinations although it is not overwhelming at this time.   Does struggle with sleep on and off mostly because of her problem with CPAP, pain.  Seroquel and trazodone combination does help to sleep.  Currently denies any suicidality, homicidality.  Does report psychosocial stressors of her mother-in-law who needs a lot of care and lives with her and her spouse.  That is a major stressor for her.  Per collateral information from spouse, patient started having this feeling of bugs biting her and then she started seeing black and white specks all over including her bedsheet, her rugs and food.  Patient has been using a tape to get the bugs off and also obsessesed about cleaning to get them off.  Although spouse also believes Seroquel is beneficial and may have helped.  Patient today in session appeared to have significant problems with her memory, could not give the names of medications, certain timelines, recent medication changes and does report short-term memory problems.  Patient with family history of Lewy body dementia, does have a neurology appointment coming up, may benefit from neuropsychological testing for further evaluation.  Patient agreeable to discuss with neurology.  Visit Diagnosis:    ICD-10-CM   1. MDD (major depressive disorder), recurrent episode, moderate (HCC)  F33.1 EKG 12-Lead    QUEtiapine (SEROQUEL) 50 MG tablet    2. GAD (generalized anxiety disorder)  F41.1     3. Psychosis, unspecified psychosis type ([place])  F29 pramipexole (MIRAPEX) 0.5 MG tablet    pramipexole (MIRAPEX) 0.5 MG tablet    QUEtiapine (SEROQUEL) 50 MG tablet    4. Insomnia due to mental disorder  F51.05 traZODone (DESYREL) 150 MG tablet   mood, pain  5. At risk for prolonged QT interval syndrome  Z91.89 EKG 12-Lead      Past Psychiatric History: I had a reviewed past psychiatric history from my progress note on 08/06/2017.  Past trials of Latuda, Wellbutrin, Cymbalta.  Past Medical History:  Past Medical History:  Diagnosis Date   Anemia    in the past    Anxiety    Arthritis    all over   Cervical spondylosis with myelopathy 2018   Chronic pain 2019   can't stand straight, uses walker for stability   DDD (degenerative disc disease), lumbar    Depression    Displacement of cervical intervertebral disc 2018   Dyspnea    HANDICAPPED   Fibromyalgia    Polymyalgia Rheumatica   GERD (gastroesophageal reflux disease)    Heart murmur    not treated or being followed   Hypertension    CONTROLLED   Hypothyroidism    Lumbar post-laminectomy syndrome 2018   Lumbosacral radiculitis 2018   Neuropathy due to medical condition ([place])    feet, toes, fingers   Osteoarthritis    Osteoporosis    Other long term (current) drug therapy 2019   from pain management   Pain in the coccyx 2018   disorder of sacrum   Spondylolisthesis 2018   Spondylosis of lumbosacral region without myelopathy or radiculopathy 2018   Spondylosis with myelopathy, lumbar region 2018   Vitamin D deficiency     Past Surgical History:  Procedure Laterality Date   ABDOMINAL HYSTERECTOMY  2000   APPENDECTOMY     arm surgery  2002   BREAST BIOPSY Left 2005   benign   CATARACT EXTRACTION W/PHACO Right 10/21/2017   Procedure: CATARACT EXTRACTION PHACO AND INTRAOCULAR LENS PLACEMENT ([facility]) RIGHT;  Surgeon: Leandrew Koyanagi, MD;  Location: [facility];  Service: Ophthalmology;  Laterality: Right;  neck issues   CESAREAN SECTION     x 2   CHOLECYSTECTOMY     COLONOSCOPY WITH PROPOFOL N/A 08/25/2017   Procedure: COLONOSCOPY WITH PROPOFOL;  Surgeon: Toledo, Benay Pike, MD;  Location: [facility];  Service: Gastroenterology;  Laterality: N/A;   epidural injection  2018   steroids   GASTRIC BYPASS  2004   JOINT REPLACEMENT Bilateral 2005   knees   LUMBAR FUSION  2016   x 2/ L3-5   SHOULDER ARTHROSCOPY WITH ROTATOR CUFF REPAIR Right 2008   x 3   SPINAL CORD STIMULATOR IMPLANT  2018   [facility]   TEMPORAL ARTERY BIOPSY / LIGATION     TONSILLECTOMY       Family Psychiatric History: I have reviewed family psychiatric history from progress note on 08/06/2017.  Family History:  Family History  Problem Relation Age of Onset   Kidney disease Mother    Cancer Mother    Anxiety disorder Mother    Depression Mother    Breast cancer Mother    Cancer Father    Hypertension Father    Cancer Brother    Breast cancer Maternal Aunt    Breast cancer Cousin     Social History: I have reviewed social history from progress note on 08/06/2017. Social History   Socioeconomic History   Marital status: Married    Spouse name: brady    Number of children: 2   Years of education: Not on file   Highest education level: High school graduate  Occupational History    Comment: disabled   Tobacco Use   Smoking status: Former  Packs/day: 0.50    Years: 20.00    Total pack years: 10.00    Types: Cigarettes    Quit date: 05/21/2007    Years since quitting: 14.6   Smokeless tobacco: Never  Vaping Use   Vaping Use: Never used  Substance and Sexual Activity   Alcohol use: No   Drug use: No    Comment: managed by pain clinic   Sexual activity: Not Currently  Other Topics Concern   Not on file  Social History Narrative   Not on file   Social Determinants of Health   Financial Resource Strain: Medium Risk (05/20/2017)   Overall Financial Resource Strain (CARDIA)    Difficulty of Paying Living Expenses: Somewhat hard  Food Insecurity: No Food Insecurity (05/20/2017)   Hunger Vital Sign    Worried About Running Out of Food in the Last Year: Never true    Ran Out of Food in the Last Year: Never true  Transportation Needs: No Transportation Needs (05/20/2017)   PRAPARE - Hydrologist (Medical): No    Lack of Transportation (Non-Medical): No  Physical Activity: Inactive (05/20/2017)   Exercise Vital Sign    Days of Exercise per Week: 0 days    Minutes of Exercise per Session: 0 min  Stress: Not on file  Social  Connections: Moderately Integrated (05/20/2017)   Social Connection and Isolation Panel [NHANES]    Frequency of Communication with Friends and Family: Three times a week    Frequency of Social Gatherings with Friends and Family: Once a week    Attends Religious Services: More than 4 times per year    Active Member of [employer] or Organizations: No    Attends Archivist Meetings: Never    Marital Status: Married    Allergies:  Allergies  Allergen Reactions   Pedi-Pre Tape Spray [Wound Dressing Adhesive] Rash    Metabolic Disorder Labs: No results found for: "HGBA1C", "MPG" No results found for: "PROLACTIN" No results found for: "CHOL", "TRIG", "HDL", "CHOLHDL", "VLDL", "LDLCALC" No results found for: "TSH"  Therapeutic Level Labs: No results found for: "LITHIUM" No results found for: "VALPROATE" No results found for: "CBMZ"  Current Medications: Current Outpatient Medications  Medication Sig Dispense Refill   azelastine (ASTELIN) 0.1 % nasal spray U 1 SPR IEN BID     buPROPion (WELLBUTRIN XL) 300 MG 24 hr tablet Take 300 mg by mouth daily.     furosemide (LASIX) 20 MG tablet Take 20 mg by mouth daily.     hydrocortisone 2.5 % cream Apply topically as directed. Qd to bid up to 5 days a week to aa face 45 g 3   levorphanol (LEVODROMORAN) 2 MG tablet Take 1 tablet (2 mg total) by mouth every 8 (eight) hours as needed for pain. 90 tablet 0   [START ON 02/15/2022] levorphanol (LEVODROMORAN) 2 MG tablet Take 1 tablet (2 mg total) by mouth every 8 (eight) hours as needed for pain. 90 tablet 0   levothyroxine (SYNTHROID) 50 MCG tablet      metoprolol succinate (TOPROL-XL) 100 MG 24 hr tablet Take 100 mg by mouth once daily in the evening  0   mometasone (ELOCON) 0.1 % cream Apply to affected areas rash on legs and left elbow once to twice daily as needed for itch. Avoid face, groin, underarms. 45 g 1   omeprazole (PRILOSEC) 20 MG capsule Take 20 mg by mouth daily.     [START ON  01/23/2022]  pramipexole (MIRAPEX) 0.5 MG tablet Take 1 tablet (0.5 mg total) by mouth daily. Stop taking after 7 days 7 tablet 0   pregabalin (LYRICA) 150 MG capsule Take 150 mg by mouth 3 (three) times daily.     QUEtiapine (SEROQUEL) 50 MG tablet Take 1 tablet (50 mg total) by mouth at bedtime. 90 tablet 0   vitamin B-12 (CYANOCOBALAMIN) 1000 MCG tablet Take by mouth.     pramipexole (MIRAPEX) 0.5 MG tablet Take 1 tablet (0.5 mg total) by mouth in the morning and at bedtime. 7 tablet 0   traZODone (DESYREL) 150 MG tablet Take 0.5-1 tablets (75-150 mg total) by mouth at bedtime as needed. 30 tablet 1   No current facility-administered medications for this visit.     Musculoskeletal: Strength & Muscle Tone: within normal limits Gait & Station:  slow , uses walker Patient leans: Front  Psychiatric Specialty Exam: Review of Systems  Musculoskeletal:  Positive for arthralgias, back pain and gait problem.  Psychiatric/Behavioral:  Positive for dysphoric mood and hallucinations. The patient is nervous/anxious.   All other systems reviewed and are negative.   Blood pressure 108/67, temperature 97.6 F (36.4 C), temperature source Temporal, height 5' (1.524 m), weight 253 lb (114.8 kg), SpO2 90 %.Body mass index is 49.41 kg/m.  General Appearance: Casual  Eye Contact:  Fair  Speech:  Clear and Coherent  Volume:  Normal  Mood:  Anxious  Affect:  Congruent  Thought Process:  Goal Directed and Descriptions of Associations: Intact  Orientation:  Full (Time, Place, and Person)  Thought Content: Delusions and Hallucinations: Visual bugs on skin, black specks on her food, sheets, rugs  Suicidal Thoughts:  No  Homicidal Thoughts:  No  Memory:  Immediate;   Fair Recent;   Fair Remote;   Poor  Judgement:  Fair  Insight:  Fair  Psychomotor Activity:  Normal  Concentration:  Concentration: Fair and Attention Span: Fair  Recall:  [employer] of Knowledge: Fair  Language: Fair  Akathisia:  No   Handed:  Right  AIMS (if indicated): done  Assets:  Communication Skills Desire for Improvement Housing Intimacy Social Support Transportation  ADL's:  Intact  Cognition: WNL  Sleep:  Fair   Screenings: [place] Office Visit from 01/16/2022 in [facility] Total Score 0      GAD-7    [place] Visit from 01/16/2022 in [facility]  Total GAD-7 Score 4      PHQ2-9    [place] Visit from 01/16/2022 in [facility] Procedure visit from 11/06/2021 in [facility] Office Visit from 08/27/2021 in [facility] Office Visit from 07/09/2021 in [facility] Procedure visit from 05/01/2021 in [facility]  PHQ-2 Total Score 1 0 0 0 0  PHQ-9 Total Score 12 -- -- -- --      [place] Office Visit from 01/16/2022 in [facility] No Risk        Assessment and Plan: Connie [name] is a 66 year old Caucasian female with history of MDD, GAD, chronic pain, hypothyroidism, recent psychosis, presented for medication management.  Patient currently has better control of her depression, although continues to have anxiety about her current situational stressors.  Patient on medications-dopamine agonist-pramipexole-unknown if this could be exacerbating her hallucinations  or causing it.  Patient also with cognitive issues with a strong family history of Lewy body dementia-will continue to need neurological evaluation and follow-up.  Discussed plan as noted below.  Plan MDD-improving Continue Wellbutrin XL 300 mg p.o. daily Continue Seroquel as noted below.  GAD-improving Seroquel as noted below. Will consider referral for CBT as needed  Psychosis  unspecified-unstable Increase Seroquel to 50 mg p.o. nightly Reviewed CT scan brain dated 12/18/2021-no acute pathology. Will reduce and taper off Mirapex, patient provided instructions to do so.  Insomnia-improving Continue Seroquel as prescribed Patient advised to reduce trazodone to half tablet of 150 mg and to use it only as needed.  Provided education about drug to drug interaction with Seroquel including dizziness, orthostatic hypotension, falls.  At risk for prolonged QT syndrome-we will order EKG.  Patient provided phone number-[phone]-patient to schedule an appointment.   I have reviewed labs including urine tox screen-01/07/2022, TSH-12/06/2021-within normal limits.  Patient with vitamin B12-12/06/2021 currently on replacement.  Deficiency-  I have reviewed notes per Connie [name] 12/06/2021 as noted above.  Reviewed notes per Mr.Whitaker -primary care -most recent-12/26/2021 as noted above.  Collateral information obtained from spouse- Connie [name] who was present in session as noted above.  Will coordinate care with neurology, primary care provider.  Follow-up in clinic in 4 weeks or sooner if needed.   I have spent atleast 40 minutes face to face with patient today which includes the time spent for preparing to see the patient ( e.g., review of test, records ), obtaining and to review and separately obtained history , ordering medications and test ,psychoeducation and supportive psychotherapy and care coordination,as well as documenting clinical information in electronic health record,interpreting and communication of test results  This note was generated in part or whole with voice recognition software. Voice recognition is usually quite accurate but there are transcription errors that can and very often do occur. I apologize for any typographical errors that were not detected and corrected.       Ursula Alert, MD 01/17/2022, 1:29 PM

## 2022-01-16 NOTE — Patient Instructions (Signed)
Please call for EKG - [phone]   Quetiapine Tablets What is this medication? QUETIAPINE (kwe TYE a peen) treats schizophrenia and bipolar disorder. It works by balancing the levels of dopamine and serotonin in your brain, hormones that help regulate mood, behaviors, and thoughts. It belongs to a group of medications called antipsychotics. Antipsychotic medications can be used to treat several kinds of mental health conditions. This medicine may be used for other purposes; ask your health care provider or pharmacist if you have questions. COMMON BRAND NAME(S): Seroquel What should I tell my care team before I take this medication? They need to know if you have any of these conditions: Blockage in your bowel Cataracts Constipation Dementia Diabetes Difficulty swallowing Glaucoma Heart disease High levels of prolactin History of breast cancer History of irregular heartbeat Liver disease Low blood counts, like low white cell, platelet, or red cell counts Low blood pressure Parkinson's disease Prostate disease Seizures Suicidal thoughts, plans or attempt; a previous suicide attempt by you or a family member Thyroid disease Trouble passing urine An unusual or allergic reaction to quetiapine, other medications, foods, dyes, or preservatives Pregnant or trying to get pregnant Breast-feeding How should I use this medication? Take this medication by mouth. Swallow it with a drink of water. Follow the directions on the prescription label. If it upsets your stomach you can take it with food. Take your medication at regular intervals. Do not take it more often than directed. Do not stop taking except on the advice of your care team. A special MedGuide will be given to you by the pharmacist with each prescription and refill. Be sure to read this information carefully each time. Talk to your care team about the use of this medication in children. While this medication may be prescribed for  children as young as 10 years for selected conditions, precautions do apply. Patients over age 56 years may have a stronger reaction to this medication and need smaller doses. Overdosage: If you think you have taken too much of this medicine contact a poison control center or emergency room at once. NOTE: This medicine is only for you. Do not share this medicine with others. What if I miss a dose? If you miss a dose, take it as soon as you can. If it is almost time for your next dose, take only that dose. Do not take double or extra doses. What may interact with this medication? Do not take this medication with any of the following: Cisapride Dronedarone Metoclopramide Pimozide Thioridazine This medication may also interact with the following: Alcohol Antihistamines for allergy, cough, and cold Atropine Avasimibe Certain antivirals for HIV or hepatitis Certain medications for anxiety or sleep Certain medications for bladder problems like oxybutynin, tolterodine Certain medications for depression like amitriptyline, fluoxetine, nefazodone, sertraline Certain medications for fungal infections like fluconazole, ketoconazole, itraconazole, posaconazole Certain medications for stomach problems like dicyclomine, hyoscyamine Certain medications for travel sickness like scopolamine Cimetidine General anesthetics like halothane, isoflurane, methoxyflurane, propofol Ipratropium Levodopa or other medications for Parkinson's disease Medications for blood pressure Medications for seizures Medications that relax muscles for surgery Narcotic medications for pain Other medications that prolong the QT interval (cause an abnormal heart rhythm) Phenothiazines like chlorpromazine, prochlorperazine Rifampin St. John's Wort This list may not describe all possible interactions. Give your health care provider a list of all the medicines, herbs, non-prescription drugs, or dietary supplements you use.  Also tell them if you smoke, drink alcohol, or use illegal drugs. Some items may  interact with your medicine. What should I watch for while using this medication? Visit your care team for regular checks on your progress. Tell your care team if symptoms do not start to get better or if they get worse. Do not stop taking except on your care team's advice. You may develop a severe reaction. Your care team will tell you how much medication to take. You may need to have an eye exam before and during use of this medication. This medication may increase blood sugar. Ask your care team if changes in diet or medications are needed if you have diabetes. Patients and their families should watch out for new or worsening depression or thoughts of suicide. Also watch out for sudden or severe changes in feelings such as feeling anxious, agitated, panicky, irritable, hostile, aggressive, impulsive, severely restless, overly excited and hyperactive, or not being able to sleep. If this happens, especially at the beginning of antidepressant treatment or after a change in dose, call your care team. You may get dizzy or drowsy. Do not drive, use machinery, or do anything that needs mental alertness until you know how this medication affects you. Do not stand or sit up quickly, especially if you are an older patient. This reduces the risk of dizzy or fainting spells. Alcohol may interfere with the effect of this medication. Avoid alcoholic drinks. This medication can cause problems with controlling your body temperature. It can lower the response of your body to cold temperatures. If possible, stay indoors during cold weather. If you must go outdoors, wear warm clothes. It can also lower the response of your body to heat. Do not overheat. Do not over-exercise. Stay out of the sun when possible. If you must be in the sun, wear cool clothing. Drink plenty of water. If you have trouble controlling your body temperature, call your  care team right away. What side effects may I notice from receiving this medication? Side effects that you should report to your care team as soon as possible: Allergic reactions--skin rash, itching, hives, swelling of the face, lips, tongue, or throat Heart rhythm changes--fast or irregular heartbeat, dizziness, feeling faint or lightheaded, chest pain, trouble breathing High blood sugar (hyperglycemia)--increased thirst or amount of urine, unusual weakness or fatigue, blurry vision High fever, stiff muscles, increased sweating, fast or irregular heartbeat, and confusion, which may be signs of neuroleptic malignant syndrome High prolactin level--unexpected breast tissue growth, discharge from the nipple, change in sex drive or performance, irregular menstrual cycle Increase in blood pressure in children Infection--fever, chills, cough, or sore throat Low blood pressure--dizziness, feeling faint or lightheaded, blurry vision Low thyroid levels (hypothyroidism)--unusual weakness or fatigue, increased sensitivity to cold, constipation, hair loss, dry skin, weight gain, feelings of depression Pain or trouble swallowing Seizures Stroke--sudden numbness or weakness of the face, arm, or leg, trouble speaking, confusion, trouble walking, loss of balance or coordination, dizziness, severe headache, change in vision Sudden eye pain or change in vision such as blurry vision, seeing halos around lights, vision loss Thoughts of suicide or self-harm, worsening mood, feelings of depression Trouble passing urine Uncontrolled and repetitive body movements, muscle stiffness or spasms, tremors or shaking, loss of balance or coordination, restlessness, shuffling walk, which may be signs of extrapyramidal symptoms (EPS) Side effects that usually do not require medical attention (report to your care team if they continue or are bothersome): Constipation Dizziness Drowsiness Dry mouth Weight gain This list may  not describe all possible side effects. Call your doctor for  medical advice about side effects. You may report side effects to FDA at 1-800-FDA-1088. Where should I keep my medication? Keep out of the reach of children. Store at room temperature between 15 and 30 degrees C (59 and 86 degrees F). Throw away any unused medication after the expiration date. NOTE: This sheet is a summary. It may not cover all possible information. If you have questions about this medicine, talk to your doctor, pharmacist, or health care provider.  2023 Elsevier/Gold Standard (2020-07-25 00:00:00)

## 2022-01-20 ENCOUNTER — Ambulatory Visit: Payer: Medicare Other | Admitting: Dermatology

## 2022-01-23 ENCOUNTER — Telehealth: Payer: Self-pay | Admitting: Psychiatry

## 2022-01-23 NOTE — Telephone Encounter (Signed)
Pt.notified

## 2022-01-23 NOTE — Telephone Encounter (Signed)
(  This is Dr. Charlcie Cradle patient). Could you contact the patient. She can continue the current medication as it is. Also ask if she has any chest pain or cardiac symptoms- if she does, please advise her to contact her PCP along with her recent EKG result.   No prolongation of QTc. It is unclear about interpretation of septal infarct. She did have T wave inversion in the prior EKG. Will route this to Dr. Shea Evans for review after she is back.

## 2022-02-12 ENCOUNTER — Ambulatory Visit (INDEPENDENT_AMBULATORY_CARE_PROVIDER_SITE_OTHER): Payer: Medicare Other | Admitting: Psychiatry

## 2022-02-12 ENCOUNTER — Encounter: Payer: Self-pay | Admitting: Psychiatry

## 2022-02-12 VITALS — BP 131/70 | HR 75 | Temp 98.0°F | Wt 245.4 lb

## 2022-02-12 DIAGNOSIS — G4701 Insomnia due to medical condition: Secondary | ICD-10-CM

## 2022-02-12 DIAGNOSIS — G5603 Carpal tunnel syndrome, bilateral upper limbs: Secondary | ICD-10-CM | POA: Insufficient documentation

## 2022-02-12 DIAGNOSIS — F411 Generalized anxiety disorder: Secondary | ICD-10-CM

## 2022-02-12 DIAGNOSIS — F331 Major depressive disorder, recurrent, moderate: Secondary | ICD-10-CM | POA: Diagnosis not present

## 2022-02-12 DIAGNOSIS — F29 Unspecified psychosis not due to a substance or known physiological condition: Secondary | ICD-10-CM

## 2022-02-12 DIAGNOSIS — Z9189 Other specified personal risk factors, not elsewhere classified: Secondary | ICD-10-CM

## 2022-02-12 MED ORDER — QUETIAPINE FUMARATE 100 MG PO TABS: 100.0000 mg | ORAL_TABLET | Freq: Every day | ORAL | 1 refills | Status: DC

## 2022-02-12 NOTE — Progress Notes (Unsigned)
[facility] OP Progress Note  02/12/2022 4:40 PM Connie [name]  MRN:  [id number]  Chief Complaint:  Chief Complaint  Patient presents with   Follow-up   Anxiety   Hallucinations   Depression   HPI: Connie [name] is a 66 year old Caucasian female, married, lives in [place], has a history of depression, anxiety, psychosis, insomnia, obstructive sleep apnea on CPAP, fibromyalgia, hypothyroidism, polyarthralgia, was evaluated in office today.  Patient today reports she continues to see black specks on her food.  She does have difficulty eating at times although she has been making sure she eats well enough.  She has been trying to lose weight and may have lost a few pounds since her last visit and is happy about that.  Patient also reports she has this feeling of something crawling at the end of her fingers of both hands.  Patient with bilateral carpal tunnel syndrome, unknown if that likely could be contributing to the tingling sensation.  She does have a neurology follow-up.  Patient reports she was able to stop the Mirapex as discussed last visit.  Although she is sleeping better than before with the addition of Seroquel she has not noticed much difference with the Seroquel on her visual hallucinations.  She also continues to be preoccupied with these delusions.  Patient reports today that she wants to correct herself and wants to say that she does not have hallucinations of seeing bugs on her skin rather she has all these dead skin and she wants to make sure that treated properly.  Patient continues to have psychosocial stressors of her mother-in-law who lives in the same house.  Patient reports otherwise compliant on medications.  Denies side effects.  Denies any suicidality or homicidality.  Denies any other concerns today.  Visit Diagnosis:    ICD-10-CM   1. MDD (major depressive disorder), recurrent episode, moderate (HCC)  F33.1     2. GAD (generalized anxiety disorder)   F41.1     3. Psychosis, unspecified psychosis type (HCC)  F29 QUEtiapine (SEROQUEL) 100 MG tablet    EKG 12-Lead    4. Insomnia due to medical condition  G47.01    Mood, pain    5. At risk for prolonged QT interval syndrome  Z91.89 EKG 12-Lead      Past Psychiatric History: Reviewed past psychiatric history from progress note on 08/06/2017.  Past trials of Latuda, Wellbutrin, Cymbalta.  Past Medical History:  Past Medical History:  Diagnosis Date   Anemia    in the past   Anxiety    Arthritis    all over   Cervical spondylosis with myelopathy 2018   Chronic pain 2019   can't stand straight, uses walker for stability   DDD (degenerative disc disease), lumbar    Depression    Displacement of cervical intervertebral disc 2018   Dyspnea    HANDICAPPED   Fibromyalgia    Polymyalgia Rheumatica   GERD (gastroesophageal reflux disease)    Heart murmur    not treated or being followed   Hypertension    CONTROLLED   Hypothyroidism    Lumbar post-laminectomy syndrome 2018   Lumbosacral radiculitis 2018   Neuropathy due to medical condition ([place])    feet, toes, fingers   Osteoarthritis    Osteoporosis    Other long term (current) drug therapy 2019   from pain management   Pain in the coccyx 2018   disorder of sacrum   Spondylolisthesis 2018   Spondylosis of lumbosacral  region without myelopathy or radiculopathy 2018   Spondylosis with myelopathy, lumbar region 2018   Vitamin D deficiency     Past Surgical History:  Procedure Laterality Date   ABDOMINAL HYSTERECTOMY  2000   APPENDECTOMY     arm surgery  2002   BREAST BIOPSY Left 2005   benign   CATARACT EXTRACTION W/PHACO Right 10/21/2017   Procedure: CATARACT EXTRACTION PHACO AND INTRAOCULAR LENS PLACEMENT ([place]) RIGHT;  Surgeon: Leandrew Koyanagi, MD;  Location: [facility];  Service: Ophthalmology;  Laterality: Right;  neck issues   CESAREAN SECTION     x 2   CHOLECYSTECTOMY     COLONOSCOPY WITH PROPOFOL  N/A 08/25/2017   Procedure: COLONOSCOPY WITH PROPOFOL;  Surgeon: Toledo, Benay Pike, MD;  Location: [facility];  Service: Gastroenterology;  Laterality: N/A;   epidural injection  2018   steroids   GASTRIC BYPASS  2004   JOINT REPLACEMENT Bilateral 2005   knees   LUMBAR FUSION  2016   x 2/ L3-5   SHOULDER ARTHROSCOPY WITH ROTATOR CUFF REPAIR Right 2008   x 3   SPINAL CORD STIMULATOR IMPLANT  2018   [employer]   TEMPORAL ARTERY BIOPSY / LIGATION     TONSILLECTOMY      Family Psychiatric History: Reviewed family psychiatric history from progress note on 08/06/2017.  Family History:  Family History  Problem Relation Age of Onset   Kidney disease Mother    Cancer Mother    Anxiety disorder Mother    Depression Mother    Breast cancer Mother    Cancer Father    Hypertension Father    Cancer Brother    Breast cancer Maternal Aunt    Breast cancer Cousin     Social History: Reviewed social history from progress note on 08/06/2017. Social History   Socioeconomic History   Marital status: Married    Spouse name: brady    Number of children: 2   Years of education: Not on file   Highest education level: High school graduate  Occupational History    Comment: disabled   Tobacco Use   Smoking status: Former    Packs/day: 0.50    Years: 20.00    Total pack years: 10.00    Types: Cigarettes    Quit date: 05/21/2007    Years since quitting: 14.7   Smokeless tobacco: Never  Vaping Use   Vaping Use: Never used  Substance and Sexual Activity   Alcohol use: No   Drug use: No    Comment: managed by pain clinic   Sexual activity: Not Currently  Other Topics Concern   Not on file  Social History Narrative   Not on file   Social Determinants of Health   Financial Resource Strain: Medium Risk (05/20/2017)   Overall Financial Resource Strain (CARDIA)    Difficulty of Paying Living Expenses: Somewhat hard  Food Insecurity: No Food Insecurity (05/20/2017)   Hunger  Vital Sign    Worried About Running Out of Food in the Last Year: Never true    Ran Out of Food in the Last Year: Never true  Transportation Needs: No Transportation Needs (05/20/2017)   PRAPARE - Hydrologist (Medical): No    Lack of Transportation (Non-Medical): No  Physical Activity: Inactive (05/20/2017)   Exercise Vital Sign    Days of Exercise per Week: 0 days    Minutes of Exercise per Session: 0 min  Stress: Not on file  Social  Connections: Moderately Integrated (05/20/2017)   Social Connection and Isolation Panel [NHANES]    Frequency of Communication with Friends and Family: Three times a week    Frequency of Social Gatherings with Friends and Family: Once a week    Attends Religious Services: More than 4 times per year    Active Member of [employer] or Organizations: No    Attends Archivist Meetings: Never    Marital Status: Married    Allergies:  Allergies  Allergen Reactions   Pedi-Pre Tape Spray [Wound Dressing Adhesive] Rash    Metabolic Disorder Labs: No results found for: "HGBA1C", "MPG" No results found for: "PROLACTIN" No results found for: "CHOL", "TRIG", "HDL", "CHOLHDL", "VLDL", "LDLCALC" No results found for: "TSH"  Therapeutic Level Labs: No results found for: "LITHIUM" No results found for: "VALPROATE" No results found for: "CBMZ"  Current Medications: Current Outpatient Medications  Medication Sig Dispense Refill   azelastine (ASTELIN) 0.1 % nasal spray U 1 SPR IEN BID     buPROPion (WELLBUTRIN XL) 300 MG 24 hr tablet Take 300 mg by mouth daily.     furosemide (LASIX) 20 MG tablet Take 20 mg by mouth daily.     hydrocortisone 2.5 % cream Apply topically as directed. Qd to bid up to 5 days a week to aa face 45 g 3   [START ON 02/15/2022] levorphanol (LEVODROMORAN) 2 MG tablet Take 1 tablet (2 mg total) by mouth every 8 (eight) hours as needed for pain. 90 tablet 0   levothyroxine (SYNTHROID) 50 MCG tablet       metoprolol succinate (TOPROL-XL) 100 MG 24 hr tablet Take 100 mg by mouth once daily in the evening  0   mometasone (ELOCON) 0.1 % cream Apply to affected areas rash on legs and left elbow once to twice daily as needed for itch. Avoid face, groin, underarms. 45 g 1   omeprazole (PRILOSEC) 20 MG capsule Take 20 mg by mouth daily.     pregabalin (LYRICA) 150 MG capsule Take 150 mg by mouth 3 (three) times daily.     QUEtiapine (SEROQUEL) 100 MG tablet Take 1 tablet (100 mg total) by mouth at bedtime. 30 tablet 1   traZODone (DESYREL) 150 MG tablet Take 0.5-1 tablets (75-150 mg total) by mouth at bedtime as needed. 30 tablet 1   vitamin B-12 (CYANOCOBALAMIN) 1000 MCG tablet Take by mouth.     No current facility-administered medications for this visit.     Musculoskeletal: Strength & Muscle Tone: within normal limits Gait & Station:  uses a walker Patient leans: Front  Psychiatric Specialty Exam: Review of Systems  Musculoskeletal:  Positive for arthralgias and back pain.  Psychiatric/Behavioral:  Positive for hallucinations. The patient is nervous/anxious.   All other systems reviewed and are negative.   Blood pressure 131/70, pulse 75, temperature 98 F (36.7 C), temperature source Oral, weight 245 lb 6.4 oz (111.3 kg).Body mass index is 47.93 kg/m.  General Appearance: Casual  Eye Contact:  Fair  Speech:  Clear and Coherent  Volume:  Normal  Mood:  Anxious  Affect:  Congruent  Thought Process: linear and description of associations intact   Orientation:  Full (Time, Place, and Person)  Thought Content: Delusions   Suicidal Thoughts:  No  Homicidal Thoughts:  No  Memory:  Immediate;   Fair Recent;   Fair Remote;   Poor  Judgement:  Fair  Insight:  Fair  Psychomotor Activity:  Normal  Concentration:  Concentration: Fair and Attention  Span: Fair  Recall:  [employer] of Knowledge: Fair  Language: Fair  Akathisia:  No  Handed:  Right  AIMS (if indicated): done  Assets:   Communication Skills Desire for Improvement Housing Intimacy Social Support Transportation  ADL's:  Intact  Cognition: WNL  Sleep:  Fair   Screenings: [place] Office Visit from 02/12/2022 in [place] Office Visit from 01/16/2022 in [place] Total Score 0 0      GAD-7    [place] Visit from 02/12/2022 in [place] Office Visit from 01/16/2022 in [place]  Total GAD-7 Score 3 4      PHQ2-9    [place] Visit from 02/12/2022 in [place] Office Visit from 01/16/2022 in [place] Procedure visit from 11/06/2021 in [place] Office Visit from 08/27/2021 in [place] Office Visit from 07/09/2021 in [place]  PHQ-2 Total Score 1 1 0 0 0  PHQ-9 Total Score 7 12 -- -- --      [place] Office Visit from 02/12/2022 in [place] Office Visit from 01/16/2022 in [place] No Risk No Risk        Assessment and Plan: Valincia Touch is a 66 year old Caucasian female with history of MDD, GAD, chronic pain, hypothyroidism, presented for medication management.  Patient continues to have hallucinations, delusions, with some improvement on the Seroquel, will benefit from the following plan.  Plan MDD-improving Wellbutrin XL 300 mg p.o. daily Seroquel increased to 100 mg p.o. daily  GAD-improving Seroquel increased to 100 mg p.o. nightly   Psychosis unspecified-unstable Increase Seroquel to 100 mg p.o. nightly CT brain-12/18/2021-within normal limits Patient was taken off of the Mirapex, currently completely off of  it.   Insomnia-improving Seroquel 100 mg p.o. nightly Continues to be on trazodone half tablet to 150 mg-advised to continue the same and use it only as needed.  At risk for prolonged QT syndrome-we will order EKG again.  Most recent EKG-   Collaboration of Care: Collaboration of Care: {[facility] OP Collaboration of Care:[id number]}  Patient/Guardian was advised Release of Information must be obtained prior to any record release in order to collaborate their care with an outside provider. Patient/Guardian was advised if they have not already done so to contact the registration department to sign all necessary forms in order for [place] to release information regarding their care.   Consent: Patient/Guardian gives verbal consent for treatment and assignment of benefits for services provided during this visit. Patient/Guardian expressed understanding and agreed to proceed.    Ursula Alert, MD 02/12/2022, 4:40 PM

## 2022-02-12 NOTE — Patient Instructions (Addendum)
Please call for [phone] 

## 2022-02-25 ENCOUNTER — Ambulatory Visit: Payer: Medicare Other | Admitting: Anesthesiology

## 2022-02-25 ENCOUNTER — Encounter
Admission: RE | Disposition: A | Discharge status: Home / Self Care | Payer: Self-pay | Source: Home / Self Care | Attending: Gastroenterology

## 2022-02-25 ENCOUNTER — Ambulatory Visit
Admission: RE | Admit: 2022-02-25 | Discharge: 2022-02-25 | Disposition: A | Discharge status: Home / Self Care | Payer: Medicare Other | Attending: Gastroenterology | Admitting: Gastroenterology

## 2022-02-25 ENCOUNTER — Other Ambulatory Visit: Payer: Self-pay

## 2022-02-25 ENCOUNTER — Encounter: Payer: Self-pay | Admitting: *Deleted

## 2022-02-25 DIAGNOSIS — Z87891 Personal history of nicotine dependence: Secondary | ICD-10-CM | POA: Insufficient documentation

## 2022-02-25 DIAGNOSIS — F419 Anxiety disorder, unspecified: Secondary | ICD-10-CM | POA: Insufficient documentation

## 2022-02-25 DIAGNOSIS — D649 Anemia, unspecified: Secondary | ICD-10-CM | POA: Insufficient documentation

## 2022-02-25 DIAGNOSIS — R0602 Shortness of breath: Secondary | ICD-10-CM | POA: Diagnosis not present

## 2022-02-25 DIAGNOSIS — G4730 Sleep apnea, unspecified: Secondary | ICD-10-CM | POA: Insufficient documentation

## 2022-02-25 DIAGNOSIS — Z6841 Body Mass Index (BMI) 40.0 and over, adult: Secondary | ICD-10-CM | POA: Insufficient documentation

## 2022-02-25 DIAGNOSIS — E039 Hypothyroidism, unspecified: Secondary | ICD-10-CM | POA: Insufficient documentation

## 2022-02-25 DIAGNOSIS — D123 Benign neoplasm of transverse colon: Secondary | ICD-10-CM | POA: Diagnosis not present

## 2022-02-25 DIAGNOSIS — K219 Gastro-esophageal reflux disease without esophagitis: Secondary | ICD-10-CM | POA: Insufficient documentation

## 2022-02-25 DIAGNOSIS — K5730 Diverticulosis of large intestine without perforation or abscess without bleeding: Secondary | ICD-10-CM | POA: Diagnosis not present

## 2022-02-25 DIAGNOSIS — I10 Essential (primary) hypertension: Secondary | ICD-10-CM | POA: Diagnosis not present

## 2022-02-25 DIAGNOSIS — R519 Headache, unspecified: Secondary | ICD-10-CM | POA: Diagnosis not present

## 2022-02-25 DIAGNOSIS — K529 Noninfective gastroenteritis and colitis, unspecified: Secondary | ICD-10-CM | POA: Diagnosis present

## 2022-02-25 DIAGNOSIS — M1990 Unspecified osteoarthritis, unspecified site: Secondary | ICD-10-CM | POA: Diagnosis not present

## 2022-02-25 DIAGNOSIS — M797 Fibromyalgia: Secondary | ICD-10-CM | POA: Insufficient documentation

## 2022-02-25 DIAGNOSIS — F32A Depression, unspecified: Secondary | ICD-10-CM | POA: Diagnosis not present

## 2022-02-25 HISTORY — PX: COLONOSCOPY WITH PROPOFOL: SHX5780

## 2022-02-25 HISTORY — DX: Sleep apnea, unspecified: G47.30

## 2022-02-25 SURGERY — COLONOSCOPY WITH PROPOFOL
Anesthesia: General

## 2022-02-25 MED ORDER — FENTANYL CITRATE (PF) 100 MCG/2ML IJ SOLN
INTRAMUSCULAR | Status: DC | PRN
  Administered 2022-02-25 (×2): 50 ug via INTRAVENOUS

## 2022-02-25 MED ORDER — PHENYLEPHRINE 80 MCG/ML (10ML) SYRINGE FOR IV PUSH (FOR BLOOD PRESSURE SUPPORT)
PREFILLED_SYRINGE | INTRAVENOUS | Status: DC | PRN
  Administered 2022-02-25: 80 ug via INTRAVENOUS

## 2022-02-25 MED ORDER — FENTANYL CITRATE (PF) 100 MCG/2ML IJ SOLN
INTRAMUSCULAR | Status: AC
  Filled 2022-02-25: qty 2

## 2022-02-25 MED ORDER — MIDAZOLAM HCL 2 MG/2ML IJ SOLN
INTRAMUSCULAR | Status: DC | PRN
  Administered 2022-02-25 (×2): 1 mg via INTRAVENOUS

## 2022-02-25 MED ORDER — SODIUM CHLORIDE 0.9 % IV SOLN: INTRAVENOUS | Status: DC

## 2022-02-25 MED ORDER — MIDAZOLAM HCL 2 MG/2ML IJ SOLN
INTRAMUSCULAR | Status: AC
  Filled 2022-02-25: qty 2

## 2022-02-25 MED ORDER — PROPOFOL 10 MG/ML IV BOLUS
INTRAVENOUS | Status: DC | PRN
  Administered 2022-02-25: 70 mg via INTRAVENOUS

## 2022-02-25 MED ORDER — PROPOFOL 500 MG/50ML IV EMUL
INTRAVENOUS | Status: DC | PRN
  Administered 2022-02-25: 75 ug/kg/min via INTRAVENOUS

## 2022-02-25 NOTE — Anesthesia Postprocedure Evaluation (Signed)
Anesthesia Post Note  Patient: Connie [name]  Procedure(s) Performed: COLONOSCOPY WITH PROPOFOL  Patient location during evaluation: Endoscopy Anesthesia Type: General Level of consciousness: awake and alert Pain management: pain level controlled Vital Signs Assessment: post-procedure vital signs reviewed and stable Respiratory status: spontaneous breathing, nonlabored ventilation and respiratory function stable Cardiovascular status: blood pressure returned to baseline and stable Postop Assessment: no apparent nausea or vomiting Anesthetic complications: no   No notable events documented.   Last Vitals:  Vitals:   02/25/22 1136 02/25/22 1146  BP: (!) 99/40 115/72  Pulse: 70 70  Resp: 19 17  Temp:    SpO2: 95%     Last Pain:  Vitals:   02/25/22 1146  TempSrc:   PainSc: 0-No pain                 Alphonsus Sias

## 2022-02-25 NOTE — Op Note (Signed)
[facility] Gastroenterology Patient Name: Connie [name] Procedure Date: 02/25/2022 10:24 AM MRN: [id number] Account #: [id number] Date of Birth: [birth year] Admit Type: Outpatient Age: 66 Room: [facility] ENDO ROOM 1 Gender: Female Note Status: Finalized Instrument Name: Jasper Riling [id number] Procedure:             Colonoscopy Indications:           Chronic diarrhea Providers:             Andrey Farmer MD, MD Referring MD:          Donnamarie Rossetti (Referring MD) Medicines:             Monitored Anesthesia Care Complications:         No immediate complications. Estimated blood loss:                         Minimal. Procedure:             Pre-Anesthesia Assessment:                        - Prior to the procedure, a History and Physical was                         performed, and patient medications and allergies were                         reviewed. The patient is competent. The risks and                         benefits of the procedure and the sedation options and                         risks were discussed with the patient. All questions                         were answered and informed consent was obtained.                         Patient identification and proposed procedure were                         verified by the physician, the nurse, the                         anesthesiologist, the anesthetist and the technician                         in the endoscopy suite. Mental Status Examination:                         alert and oriented. Airway Examination: normal                         oropharyngeal airway and neck mobility. Respiratory                         Examination: clear to auscultation. CV Examination:  normal. Prophylactic Antibiotics: The patient does not                         require prophylactic antibiotics. Prior                         Anticoagulants: The patient has taken no anticoagulant                         or  antiplatelet agents. ASA Grade Assessment: III - A                         patient with severe systemic disease. After reviewing                         the risks and benefits, the patient was deemed in                         satisfactory condition to undergo the procedure. The                         anesthesia plan was to use monitored anesthesia care                         (MAC). Immediately prior to administration of                         medications, the patient was re-assessed for adequacy                         to receive sedatives. The heart rate, respiratory                         rate, oxygen saturations, blood pressure, adequacy of                         pulmonary ventilation, and response to care were                         monitored throughout the procedure. The physical                         status of the patient was re-assessed after the                         procedure.                        After obtaining informed consent, the colonoscope was                         passed under direct vision. Throughout the procedure,                         the patient's blood pressure, pulse, and oxygen                         saturations were monitored continuously. The  Colonoscope was introduced through the anus and                         advanced to the the terminal ileum. The colonoscopy                         was performed without difficulty. The patient                         tolerated the procedure well. The quality of the bowel                         preparation was good. The terminal ileum, ileocecal                         valve, appendiceal orifice, and rectum were                         photographed. Findings:      The perianal and digital rectal examinations were normal.      The terminal ileum appeared normal.      Scattered small-mouthed diverticula were found in the sigmoid colon,       descending colon and hepatic flexure.       A 1 mm polyp was found in the transverse colon. The polyp was sessile.       The polyp was removed with a jumbo cold forceps. Resection and retrieval       were complete. Estimated blood loss was minimal.      The exam was otherwise without abnormality on direct and retroflexion       views. Impression:            - The examined portion of the ileum was normal.                        - Diverticulosis in the sigmoid colon, in the                         descending colon and at the hepatic flexure.                        - One 1 mm polyp in the transverse colon, removed with                         a jumbo cold forceps. Resected and retrieved.                        - The examination was otherwise normal on direct and                         retroflexion views. Recommendation:        - Discharge patient to home.                        - Resume previous diet.                        - Continue present medications.                        -  Await pathology results.                        - Repeat colonoscopy in 7 years for surveillance.                        - Return to referring physician as previously                         scheduled. Procedure Code(s):     --- Professional ---                        [phone], Colonoscopy, flexible; with biopsy, single or                         multiple Diagnosis Code(s):     --- Professional ---                        D12.3, Benign neoplasm of transverse colon (hepatic                         flexure or splenic flexure)                        K52.9, Noninfective gastroenteritis and colitis,                         unspecified                        K57.30, Diverticulosis of large intestine without                         perforation or abscess without bleeding CPT copyright 2022 American Medical Association. All rights reserved. The codes documented in this report are preliminary and upon coder review may  be revised to meet current compliance  requirements. Andrey Farmer MD, MD 02/25/2022 11:13:48 AM Number of Addenda: 0 Note Initiated On: 02/25/2022 10:24 AM Scope Withdrawal Time: 0 hours 10 minutes 27 seconds  Total Procedure Duration: 0 hours 14 minutes 3 seconds  Estimated Blood Loss:  Estimated blood loss was minimal.      [facility]

## 2022-02-25 NOTE — Anesthesia Preprocedure Evaluation (Addendum)
Anesthesia Evaluation  Patient identified by MRN, date of birth, ID band Patient awake    Reviewed: Allergy & Precautions, NPO status , Patient's Chart, lab work & pertinent test results  Airway Mallampati: III  TM Distance: >3 FB Neck ROM: full    Dental  (+) Chipped, Implants, Poor Dentition, Missing, Caps   Pulmonary shortness of breath, sleep apnea and Continuous Positive Airway Pressure Ventilation , former smoker,  Counseled to use CPAP tonight   Pulmonary exam normal        Cardiovascular hypertension, Normal cardiovascular exam+ Valvular Problems/Murmurs      Neuro/Psych  Headaches, PSYCHIATRIC DISORDERS Anxiety Depression  Neuromuscular disease    GI/Hepatic Neg liver ROS, GERD  Controlled,  Endo/Other  Hypothyroidism Morbid obesity  Renal/GU negative Renal ROS  negative genitourinary   Musculoskeletal  (+) Arthritis , Fibromyalgia -  Abdominal   Peds  Hematology  (+) Blood dyscrasia, anemia ,   Anesthesia Other Findings Past Medical History: No date: Anemia     Comment:  in the past No date: Anxiety No date: Arthritis     Comment:  all over 2018: Cervical spondylosis with myelopathy 2019: Chronic pain     Comment:  can't stand straight, uses walker for stability No date: DDD (degenerative disc disease), lumbar No date: Depression 2018: Displacement of cervical intervertebral disc No date: Dyspnea     Comment:  HANDICAPPED No date: Fibromyalgia     Comment:  Polymyalgia Rheumatica No date: GERD (gastroesophageal reflux disease) No date: Heart murmur     Comment:  not treated or being followed No date: Hypertension     Comment:  CONTROLLED No date: Hypothyroidism 2018: Lumbar post-laminectomy syndrome 2018: Lumbosacral radiculitis No date: Neuropathy due to medical condition ([place])     Comment:  feet, toes, fingers No date: Osteoarthritis No date: Osteoporosis 2019: Other long term (current)  drug therapy     Comment:  from pain management 2018: Pain in the coccyx     Comment:  disorder of sacrum 2018: Spondylolisthesis 2018: Spondylosis of lumbosacral region without myelopathy or  radiculopathy 2018: Spondylosis with myelopathy, lumbar region No date: Vitamin D deficiency  Past Surgical History: 2000: ABDOMINAL HYSTERECTOMY No date: APPENDECTOMY 2002: arm surgery 2005: BREAST BIOPSY; Left     Comment:  benign 10/21/2017: CATARACT EXTRACTION W/PHACO; Right     Comment:  Procedure: CATARACT EXTRACTION PHACO AND INTRAOCULAR               LENS PLACEMENT ([place]) RIGHT;  Surgeon: Leandrew Koyanagi, MD;  Location: [facility];  Service:               Ophthalmology;  Laterality: Right;  neck issues No date: CESAREAN SECTION     Comment:  x 2 No date: CHOLECYSTECTOMY 08/25/2017: COLONOSCOPY WITH PROPOFOL; N/A     Comment:  Procedure: COLONOSCOPY WITH PROPOFOL;  Surgeon: Toledo,               Benay Pike, MD;  Location: [facility] ENDOS[facility];  Service:               Gastroenterology;  Laterality: N/A; 2018: epidural injection     Comment:  steroids 2004: GASTRIC BYPASS 2005: JOINT REPLACEMENT; Bilateral     Comment:  knees 2016: LUMBAR FUSION     Comment:  x 2/ L3-5 2008: SHOULDER ARTHROSCOPY WITH ROTATOR CUFF REPAIR; Right     Comment:  x  3 2018: SPINAL CORD STIMULATOR IMPLANT     Comment:  [employer] No date: TEMPORAL ARTERY BIOPSY / LIGATION No date: TONSILLECTOMY     Reproductive/Obstetrics negative OB ROS                           Anesthesia Physical Anesthesia Plan  ASA: 3  Anesthesia Plan: General   Post-op Pain Management:    Induction: Intravenous  PONV Risk Score and Plan: Propofol infusion and TIVA  Airway Management Planned: Natural Airway and Nasal Cannula  Additional Equipment:   Intra-op Plan:   Post-operative Plan:   Informed Consent: I have reviewed the patients History and Physical,  chart, labs and discussed the procedure including the risks, benefits and alternatives for the proposed anesthesia with the patient or authorized representative who has indicated his/her understanding and acceptance.     Dental Advisory Given  Plan Discussed with: Anesthesiologist, CRNA and Surgeon  Anesthesia Plan Comments: (Patient consented for risks of anesthesia including but not limited to:  - adverse reactions to medications - risk of airway placement if required - damage to eyes, teeth, lips or other oral mucosa - nerve damage due to positioning  - sore throat or hoarseness - Damage to heart, brain, nerves, lungs, other parts of body or loss of life  Patient voiced understanding.)        Anesthesia Quick Evaluation

## 2022-02-25 NOTE — Transfer of Care (Signed)
Immediate Anesthesia Transfer of Care Note  Patient: Connie [name]  Procedure(s) Performed: COLONOSCOPY WITH PROPOFOL  Patient Location: PACU  Anesthesia Type:General  Level of Consciousness: sedated  Airway & Oxygen Therapy: Patient Spontanous Breathing and Patient connected to nasal cannula oxygen  Post-op Assessment: Report given to RN, Post -op Vital signs reviewed and stable and Patient moving all extremities  Post vital signs: Reviewed and stable  Last Vitals:  Vitals Value Taken Time  BP 106/61 02/25/22 1116  Temp 35.9 C 02/25/22 1116  Pulse 71 02/25/22 1118  Resp 16 02/25/22 1118  SpO2 98 % 02/25/22 1118  Vitals shown include unvalidated device data.  Last Pain:  Vitals:   02/25/22 1116  TempSrc: Temporal  PainSc: Asleep         Complications: No notable events documented.

## 2022-02-25 NOTE — Interval H&P Note (Signed)
History and Physical Interval Note:  02/25/2022 10:47 AM  Connie [name]  has presented today for surgery, with the diagnosis of Diarrhea.  The various methods of treatment have been discussed with the patient and family. After consideration of risks, benefits and other options for treatment, the patient has consented to  Procedure(s): COLONOSCOPY WITH PROPOFOL (N/A) as a surgical intervention.  The patient's history has been reviewed, patient examined, no change in status, stable for surgery.  I have reviewed the patient's chart and labs.  Questions were answered to the patient's satisfaction.     Lesly Rubenstein  Ok to proceed with colonoscopy

## 2022-02-25 NOTE — H&P (Signed)
Outpatient short stay form Pre-procedure 02/25/2022  Connie Rubenstein, MD  Primary Physician: Donnamarie Rossetti, PA-C  Reason for visit:  Chronic diarrhea  History of present illness:    66 y/o lady with history of hypothyroidism, fibromyalgia, obesity (history of gastric bypass), and anxiety here for colonoscopy for chronic diarrhea. Patient notes her diarrhea symptoms have improved over the past month for unclear reason. No blood thinners. No family history of GI malignancies. History of roux-en-y, partial hysterectomy, and cholecystectomy.    Current Facility-Administered Medications:    0.9 %  sodium chloride infusion, , Intravenous, Continuous, Emilene Roma, Hilton Cork, MD  Medications Prior to Admission  Medication Sig Dispense Refill Last Dose   furosemide (LASIX) 20 MG tablet Take 20 mg by mouth daily.   02/24/2022   levorphanol (LEVODROMORAN) 2 MG tablet Take 1 tablet (2 mg total) by mouth every 8 (eight) hours as needed for pain. 90 tablet 0 02/25/2022   levothyroxine (SYNTHROID) 50 MCG tablet    02/24/2022   metoprolol succinate (TOPROL-XL) 100 MG 24 hr tablet Take 100 mg by mouth once daily in the evening  0 02/24/2022   omeprazole (PRILOSEC) 20 MG capsule Take 20 mg by mouth daily.   02/24/2022   pregabalin (LYRICA) 150 MG capsule Take 150 mg by mouth 3 (three) times daily.   02/25/2022   azelastine (ASTELIN) 0.1 % nasal spray U 1 SPR IEN BID      buPROPion (WELLBUTRIN XL) 300 MG 24 hr tablet Take 300 mg by mouth daily.      hydrocortisone 2.5 % cream Apply topically as directed. Qd to bid up to 5 days a week to aa face 45 g 3    mometasone (ELOCON) 0.1 % cream Apply to affected areas rash on legs and left elbow once to twice daily as needed for itch. Avoid face, groin, underarms. 45 g 1    QUEtiapine (SEROQUEL) 100 MG tablet Take 1 tablet (100 mg total) by mouth at bedtime. 30 tablet 1    traZODone (DESYREL) 150 MG tablet Take 0.5-1 tablets (75-150 mg total) by mouth at  bedtime as needed. 30 tablet 1    vitamin B-12 (CYANOCOBALAMIN) 1000 MCG tablet Take by mouth.        Allergies  Allergen Reactions   Pedi-Pre Tape Spray [Wound Dressing Adhesive] Rash     Past Medical History:  Diagnosis Date   Anemia    in the past   Anxiety    Arthritis    all over   Cervical spondylosis with myelopathy 2018   Chronic pain 2019   can't stand straight, uses walker for stability   DDD (degenerative disc disease), lumbar    Depression    Displacement of cervical intervertebral disc 2018   Dyspnea    HANDICAPPED   Fibromyalgia    Polymyalgia Rheumatica   GERD (gastroesophageal reflux disease)    Heart murmur    not treated or being followed   Hypertension    CONTROLLED   Hypothyroidism    Lumbar post-laminectomy syndrome 2018   Lumbosacral radiculitis 2018   Neuropathy due to medical condition ([place])    feet, toes, fingers   Osteoarthritis    Osteoporosis    Other long term (current) drug therapy 2019   from pain management   Pain in the coccyx 2018   disorder of sacrum   Spondylolisthesis 2018   Spondylosis of lumbosacral region without myelopathy or radiculopathy 2018   Spondylosis with myelopathy, lumbar region 2018  Vitamin D deficiency     Review of systems:  Otherwise negative.    Physical Exam  Gen: Alert, oriented. Appears stated age.  HEENT: PERRLA. Lungs: No respiratory distress CV: RRR Abd: soft, benign, no masses Ext: No edema   Planned procedures: Proceed with colonoscopy. The patient understands the nature of the planned procedure, indications, risks, alternatives and potential complications including but not limited to bleeding, infection, perforation, damage to internal organs and possible oversedation/side effects from anesthesia. The patient agrees and gives consent to proceed.  Please refer to procedure notes for findings, recommendations and patient disposition/instructions.     Connie Rubenstein, MD [facility]  Gastroenterology

## 2022-02-26 ENCOUNTER — Encounter: Payer: Self-pay | Admitting: Gastroenterology

## 2022-02-26 LAB — SURGICAL PATHOLOGY

## 2022-03-04 ENCOUNTER — Ambulatory Visit: Payer: 59 | Admitting: Psychiatry

## 2022-03-11 ENCOUNTER — Encounter: Payer: Self-pay | Admitting: Student in an Organized Health Care Education/Training Program

## 2022-03-11 ENCOUNTER — Ambulatory Visit
Payer: Medicare Other | Attending: Student in an Organized Health Care Education/Training Program | Admitting: Student in an Organized Health Care Education/Training Program

## 2022-03-11 VITALS — BP 136/70 | HR 61 | Temp 96.9°F | Ht 60.0 in | Wt 249.0 lb

## 2022-03-11 DIAGNOSIS — M5136 Other intervertebral disc degeneration, lumbar region: Secondary | ICD-10-CM | POA: Diagnosis not present

## 2022-03-11 DIAGNOSIS — M4807 Spinal stenosis, lumbosacral region: Secondary | ICD-10-CM | POA: Diagnosis not present

## 2022-03-11 DIAGNOSIS — M5416 Radiculopathy, lumbar region: Secondary | ICD-10-CM | POA: Diagnosis present

## 2022-03-11 DIAGNOSIS — G894 Chronic pain syndrome: Secondary | ICD-10-CM

## 2022-03-11 DIAGNOSIS — M47812 Spondylosis without myelopathy or radiculopathy, cervical region: Secondary | ICD-10-CM | POA: Diagnosis not present

## 2022-03-11 MED ORDER — LEVORPHANOL TARTRATE 2 MG PO TABS: 2.0000 mg | ORAL_TABLET | Freq: Three times a day (TID) | ORAL | 0 refills | Status: AC | PRN

## 2022-03-11 MED ORDER — LEVORPHANOL TARTRATE 2 MG PO TABS: 2.0000 mg | ORAL_TABLET | Freq: Three times a day (TID) | ORAL | 0 refills | Status: DC | PRN

## 2022-03-11 NOTE — Progress Notes (Signed)
Nursing Pain Medication Assessment:  Safety precautions to be maintained throughout the outpatient stay will include: orient to surroundings, keep bed in low position, maintain call bell within reach at all times, provide assistance with transfer out of bed and ambulation.  Medication Inspection Compliance: Pill count conducted under aseptic conditions, in front of the patient. Neither the pills nor the bottle was removed from the patient's sight at any time. Once count was completed pills were immediately returned to the patient in their original bottle.  Medication:  levorphanol Pill/Patch Count:  25 of 90 pills remain Pill/Patch Appearance: Markings consistent with prescribed medication Bottle Appearance: Standard pharmacy container. Clearly labeled. Filled Date: 17 / 21 / 2023 Last Medication intake:  TodaySafety precautions to be maintained throughout the outpatient stay will include: orient to surroundings, keep bed in low position, maintain call bell within reach at all times, provide assistance with transfer out of bed and ambulation.

## 2022-03-11 NOTE — Progress Notes (Signed)
PROVIDER NOTE: Information contained herein reflects review and annotations entered in association with encounter. Interpretation of such information and data should be left to medically-trained personnel. Information provided to patient can be located elsewhere in the medical record under "Patient Instructions". Document created using STT-dictation technology, any transcriptional errors that may result from process are unintentional.    Patient: Connie [name]  Service Category: E/M  Provider: [name], MD  DOB: [birth year]  DOS: 03/11/2022  Specialty: Interventional Pain Management  MRN: [id number]  Setting: Ambulatory outpatient  PCP: Donnamarie Rossetti, PA-C  Type: Established Patient    Referring Provider: Tracie Harrier, MD  Location: Office  Delivery: Face-to-face     HPI  [name], a 66 y.o. year old female, is here today because of her Lumbar radiculopathy [M54.16]. [name] primary complain today is Back Pain (Right side) Last encounter: My last encounter with her was on 01/07/22 Pertinent problems: Connie [name] has Degenerative joint disease of cervical and lumbar spine; Lumbar spondylosis; History of lumbar fusion; Lumbar degenerative disc disease; Fibromyalgia; Chronic pain syndrome; Spinal cord stimulator status; Acquired spondylolisthesis; Chronic pain; Generalized osteoarthritis of multiple sites; Spinal stenosis of lumbar region without neurogenic claudication; and Lumbar post-laminectomy syndrome on their pertinent problem list. Pain Assessment: Severity of Chronic pain is reported as a 2 /10. Location: Back Left/pain radiaities down her left leg to her feet. Onset: More than a month ago. Quality: Constant, Aching, Shooting, Throbbing. Timing: Constant. Modifying factor(s): Meds. Vitals:  height is 5' (1.524 m) and weight is 249 lb (112.9 kg). Her temperature is 96.9 F (36.1 C) (abnormal). Her blood pressure is 136/70 and her pulse is 61. Her oxygen  saturation is 96%.   Reason for encounter: medication management.   [name] follows up today for medication management of her levorphanol.   No change in medical history since last visit.  Patient's pain is at baseline.  Patient continues multimodal pain regimen as prescribed.  States that it provides pain relief and improvement in functional status. Still getting benefit from caudal ESI in July, can consider repeating after New Year if increased pain Otherwise pt is doing well  Pharmacotherapy Assessment  Analgesic: Levorphanol 2 mg BID  prn, #90/month    Monitoring: [place] PMP: PDMP reviewed during this encounter.       Pharmacotherapy: No side-effects or adverse reactions reported. Compliance: No problems identified. Effectiveness: Clinically acceptable.  Chauncey Fischer, RN  03/11/2022  3:08 PM  Sign when Signing Visit Nursing Pain Medication Assessment:  Safety precautions to be maintained throughout the outpatient stay will include: orient to surroundings, keep bed in low position, maintain call bell within reach at all times, provide assistance with transfer out of bed and ambulation.  Medication Inspection Compliance: Pill count conducted under aseptic conditions, in front of the patient. Neither the pills nor the bottle was removed from the patient's sight at any time. Once count was completed pills were immediately returned to the patient in their original bottle.  Medication:  levorphanol Pill/Patch Count:  25 of 90 pills remain Pill/Patch Appearance: Markings consistent with prescribed medication Bottle Appearance: Standard pharmacy container. Clearly labeled. Filled Date: 5 / 21 / 2023 Last Medication intake:  TodaySafety precautions to be maintained throughout the outpatient stay will include: orient to surroundings, keep bed in low position, maintain call bell within reach at all times, provide assistance with transfer out of bed and ambulation.     UDS:  Summary  Date Value  Ref Range  Status  01/07/2022 Note  Final    Comment:    ==================================================================== [employer] Select 13 (MW) ==================================================================== Test                             Result       Flag       Units    NO DRUGS DETECTED. ==================================================================== Test                      Result    Flag   Units      Ref Range   Creatinine              49               mg/dL      >=20 ==================================================================== Declared Medications:  The flagging and interpretation on this report are based on the  following declared medications.  Unexpected results may arise from  inaccuracies in the declared medications.   **Note: The testing scope of this panel does not include the  following reported medications:   Azelastine (Astelin)  Bupropion (Wellbutrin)  Furosemide (Lasix)  Hydrocortisone  Levorphanol  Levothyroxine (Synthroid)  Metoprolol (Toprol)  Mometasone  Omeprazole (Prilosec)  Pramipexole (Mirapex)  Pregabalin (Lyrica)  Quetiapine (Seroquel)  Trazodone (Desyrel)  Vitamin B12 ==================================================================== For clinical consultation, please call [phone]. ====================================================================      ROS  Constitutional: Denies any fever or chills Gastrointestinal: No reported hemesis, hematochezia, vomiting, or acute GI distress Musculoskeletal:  Low back, bilateral leg pain Neurological: No reported episodes of acute onset apraxia, aphasia, dysarthria, agnosia, amnesia, paralysis, loss of coordination, or loss of consciousness  Medication Review  QUEtiapine, buPROPion, cyanocobalamin, furosemide, levorphanol, levothyroxine, metoprolol succinate, mometasone, omeprazole, pregabalin, and traZODone  History Review  Allergy: Connie [name] is allergic to  pedi-pre tape spray [wound dressing adhesive]. Drug: Connie [name]  reports no history of drug use. Alcohol:  reports no history of alcohol use. Tobacco:  reports that she quit smoking about 14 years ago. Her smoking use included cigarettes. She has a 10.00 pack-year smoking history. She has never used smokeless tobacco. Social: Ms. Stepien  reports that she quit smoking about 14 years ago. Her smoking use included cigarettes. She has a 10.00 pack-year smoking history. She has never used smokeless tobacco. She reports that she does not drink alcohol and does not use drugs. Medical:  has a past medical history of Anemia, Anxiety, Arthritis, Cervical spondylosis with myelopathy (2018), Chronic pain (2019), DDD (degenerative disc disease), lumbar, Depression, Displacement of cervical intervertebral disc (2018), Dyspnea, Fibromyalgia, GERD (gastroesophageal reflux disease), Heart murmur, Hypertension, Hypothyroidism, Lumbar post-laminectomy syndrome (2018), Lumbosacral radiculitis (2018), Neuropathy due to medical condition ([place]), Osteoarthritis, Osteoporosis, Other long term (current) drug therapy (2019), Pain in the coccyx (2018), Sleep apnea, Spondylolisthesis (2018), Spondylosis of lumbosacral region without myelopathy or radiculopathy (2018), Spondylosis with myelopathy, lumbar region (2018), and Vitamin D deficiency. Surgical: Ms. Droege  has a past surgical history that includes Cesarean section; Gastric bypass (2004); Lumbar fusion (2016); Spinal cord stimulator implant (2018); Cholecystectomy; Appendectomy; Tonsillectomy; Shoulder arthroscopy with rotator cuff repair (Right, 2008); Joint replacement (Bilateral, 2005); Abdominal hysterectomy (2000); epidural injection (2018); arm surgery (2002); Breast biopsy (Left, 2005); Colonoscopy with propofol (N/A, 08/25/2017); Temporal artery biopsy / ligation; Cataract extraction w/PHACO (Right, 10/21/2017); and Colonoscopy with propofol (N/A, 02/25/2022). Family:  family history includes Anxiety disorder in her mother; Breast cancer in her cousin, maternal aunt, and mother;  Cancer in her brother, father, and mother; Depression in her mother; Hypertension in her father; Kidney disease in her mother.  Laboratory Chemistry Profile   Renal Lab Results  Component Value Date   BUN 18 06/23/2017   CREATININE 0.60 06/23/2017   GFRAA >60 06/23/2017   GFRNONAA >60 06/23/2017    Hepatic Lab Results  Component Value Date   AST 20 06/23/2017   ALT 9 (L) 06/23/2017   ALBUMIN 3.6 06/23/2017   ALKPHOS 88 06/23/2017    Electrolytes Lab Results  Component Value Date   NA 135 06/23/2017   K 4.9 06/23/2017   CL 102 06/23/2017   CALCIUM 8.5 (L) 06/23/2017    Bone No results found for: "VD25OH", "VD125OH2TOT", "JO8416SA6", "TK1601UX3", "25OHVITD1", "25OHVITD2", "25OHVITD3", "TESTOFREE", "TESTOSTERONE"  Inflammation (CRP: Acute Phase) (ESR: Chronic Phase) No results found for: "CRP", "ESRSEDRATE", "LATICACIDVEN"       Note: Above Lab results reviewed.  Recent Imaging Review  CT HEAD WO CONTRAST (5MM) CLINICAL DATA:  Visual hallucinations  EXAM: CT HEAD WITHOUT CONTRAST  TECHNIQUE: Contiguous axial images were obtained from the base of the skull through the vertex without intravenous contrast.  RADIATION DOSE REDUCTION: This exam was performed according to the departmental dose-optimization program which includes automated exposure control, adjustment of the mA and/or kV according to patient size and/or use of iterative reconstruction technique.  COMPARISON:  June 22, 2019  FINDINGS: Brain: No evidence of acute infarction, hemorrhage, hydrocephalus, extra-axial collection or mass lesion/mass effect.  Vascular: No hyperdense vessel or unexpected calcification.  Skull: Normal. Negative for fracture or focal lesion.  Sinuses/Orbits: No acute finding.  Other: None.  IMPRESSION: No acute intracranial abnormality.  Electronically  Signed   By: Beryle Flock M.D.   On: 12/18/2021 15:39 Note: Reviewed        Physical Exam  General appearance: Well nourished, well developed, and well hydrated. In no apparent acute distress Mental status: Alert, oriented x 3 (person, place, & time)       Respiratory: No evidence of acute respiratory distress Eyes: PERLA Vitals: BP 136/70   Pulse 61   Temp (!) 96.9 F (36.1 C)   Ht 5' (1.524 m)   Wt 249 lb (112.9 kg)   SpO2 96%   BMI 48.63 kg/m  BMI: Estimated body mass index is 48.63 kg/m as calculated from the following:   Height as of this encounter: 5' (1.524 m).   Weight as of this encounter: 249 lb (112.9 kg). Ideal: Ideal body weight: 45.5 kg (100 lb 4.9 oz) Adjusted ideal body weight: 72.5 kg (159 lb 12.6 oz)  Assessment   Diagnosis Status  1. Lumbar radiculopathy   2. Spinal stenosis of lumbosacral region   3. Lumbar degenerative disc disease   4. Spondylosis of cervical region without myelopathy or radiculopathy   5. Chronic pain syndrome      Persistent Persistent Persistent    Plan of Care  1. Lumbar radiculopathy - levorphanol (LEVODROMORAN) 2 MG tablet; Take 1 tablet (2 mg total) by mouth every 8 (eight) hours as needed for pain.  Dispense: 90 tablet; Refill: 0 - levorphanol (LEVODROMORAN) 2 MG tablet; Take 1 tablet (2 mg total) by mouth every 8 (eight) hours as needed for pain.  Dispense: 90 tablet; Refill: 0 - levorphanol (LEVODROMORAN) 2 MG tablet; Take 1 tablet (2 mg total) by mouth every 8 (eight) hours as needed for pain.  Dispense: 90 tablet; Refill: 0  2. Spinal stenosis of lumbosacral region - levorphanol (LEVODROMORAN) 2  MG tablet; Take 1 tablet (2 mg total) by mouth every 8 (eight) hours as needed for pain.  Dispense: 90 tablet; Refill: 0 - levorphanol (LEVODROMORAN) 2 MG tablet; Take 1 tablet (2 mg total) by mouth every 8 (eight) hours as needed for pain.  Dispense: 90 tablet; Refill: 0 - levorphanol (LEVODROMORAN) 2 MG tablet; Take 1  tablet (2 mg total) by mouth every 8 (eight) hours as needed for pain.  Dispense: 90 tablet; Refill: 0  3. Lumbar degenerative disc disease  4. Spondylosis of cervical region without myelopathy or radiculopathy  5. Chronic pain syndrome - levorphanol (LEVODROMORAN) 2 MG tablet; Take 1 tablet (2 mg total) by mouth every 8 (eight) hours as needed for pain.  Dispense: 90 tablet; Refill: 0 - levorphanol (LEVODROMORAN) 2 MG tablet; Take 1 tablet (2 mg total) by mouth every 8 (eight) hours as needed for pain.  Dispense: 90 tablet; Refill: 0 - levorphanol (LEVODROMORAN) 2 MG tablet; Take 1 tablet (2 mg total) by mouth every 8 (eight) hours as needed for pain.  Dispense: 90 tablet; Refill: 0   Ms. Connie Payment Hoskin has a current medication list which includes the following long-term medication(s): bupropion, levothyroxine, metoprolol succinate, omeprazole, pregabalin, quetiapine, and trazodone.  Pharmacotherapy (Medications Ordered): Meds ordered this encounter  Medications   levorphanol (LEVODROMORAN) 2 MG tablet    Sig: Take 1 tablet (2 mg total) by mouth every 8 (eight) hours as needed for pain.    Dispense:  90 tablet    Refill:  0   levorphanol (LEVODROMORAN) 2 MG tablet    Sig: Take 1 tablet (2 mg total) by mouth every 8 (eight) hours as needed for pain.    Dispense:  90 tablet    Refill:  0   levorphanol (LEVODROMORAN) 2 MG tablet    Sig: Take 1 tablet (2 mg total) by mouth every 8 (eight) hours as needed for pain.    Dispense:  90 tablet    Refill:  0   Orders:  No orders of the defined types were placed in this encounter.  Follow-up plan:   Return in about 3 months (around 06/11/2022) for Medication Management, in person.    Recent Visits Date Type Provider Dept  01/07/22 Office Visit [name], MD [facility]  Showing recent visits within past 90 days and meeting all other requirements Today's Visits Date Type Provider Dept  03/11/22 Office Visit [name], MD [facility]  Showing today's visits and meeting all other requirements Future Appointments No visits were found meeting these conditions. Showing future appointments within next 90 days and meeting all other requirements  I discussed the assessment and treatment plan with the patient. The patient was provided an opportunity to ask questions and all were answered. The patient agreed with the plan and demonstrated an understanding of the instructions.  Patient advised to call back or seek an in-person evaluation if the symptoms or condition worsens.  Duration of encounter: 26mnutes.  Note by: BGillis Santa MD Date: 03/11/2022; Time: 3:26 PM

## 2022-03-12 ENCOUNTER — Ambulatory Visit: Payer: Medicare Other | Admitting: Psychiatry

## 2022-04-16 ENCOUNTER — Ambulatory Visit
Admission: RE | Admit: 2022-04-16 | Discharge: 2022-04-16 | Disposition: A | Discharge status: Home / Self Care | Payer: Medicare Other | Source: Ambulatory Visit | Attending: Psychiatry | Admitting: Psychiatry

## 2022-04-16 ENCOUNTER — Ambulatory Visit (INDEPENDENT_AMBULATORY_CARE_PROVIDER_SITE_OTHER): Payer: Medicare Other | Admitting: Psychiatry

## 2022-04-16 ENCOUNTER — Encounter: Payer: Self-pay | Admitting: Psychiatry

## 2022-04-16 VITALS — BP 118/71 | HR 61 | Temp 97.6°F | Ht 60.0 in | Wt 246.0 lb

## 2022-04-16 DIAGNOSIS — Z9189 Other specified personal risk factors, not elsewhere classified: Secondary | ICD-10-CM | POA: Insufficient documentation

## 2022-04-16 DIAGNOSIS — F3342 Major depressive disorder, recurrent, in full remission: Secondary | ICD-10-CM | POA: Diagnosis not present

## 2022-04-16 DIAGNOSIS — R001 Bradycardia, unspecified: Secondary | ICD-10-CM | POA: Diagnosis not present

## 2022-04-16 DIAGNOSIS — F23 Brief psychotic disorder: Secondary | ICD-10-CM | POA: Diagnosis not present

## 2022-04-16 DIAGNOSIS — G4701 Insomnia due to medical condition: Secondary | ICD-10-CM

## 2022-04-16 DIAGNOSIS — F29 Unspecified psychosis not due to a substance or known physiological condition: Secondary | ICD-10-CM | POA: Diagnosis present

## 2022-04-16 DIAGNOSIS — F411 Generalized anxiety disorder: Secondary | ICD-10-CM

## 2022-04-16 MED ORDER — QUETIAPINE FUMARATE 100 MG PO TABS: 100.0000 mg | ORAL_TABLET | Freq: Every day | ORAL | 0 refills | Status: DC

## 2022-04-16 MED ORDER — BUPROPION HCL ER (XL) 150 MG PO TB24: 150.0000 mg | ORAL_TABLET | Freq: Every day | ORAL | 0 refills | Status: DC

## 2022-04-16 NOTE — Patient Instructions (Signed)
Please call for [id number] 

## 2022-04-16 NOTE — Progress Notes (Signed)
BH MD OP Progress Note  04/16/2022 2:58 PM Connie [name]  MRN:  [id number]  Chief Complaint:  Chief Complaint  Patient presents with   Follow-up   Anxiety   Depression   Hallucinations   HPI: Connie [name] is a 66 year old Caucasian female, married, lives in [place], has a history of depression, anxiety, psychosis, insomnia, obstructive sleep apnea on CPAP, fibromyalgia, hypothyroidism, polyarthralgia was evaluated in the office today.  Patient today appeared to be alert, pleasant, oriented to person place time situation.  Patient with 3 word memory immediate 3 out of 3, after 5 minutes 3 out of 3.  Attention and focus seem to be good.  Patient was able to do serial sevens.  Patient today reports since being on the higher doses of Seroquel her hallucinations, delusions has completely resolved.  She does not see any black specks on her food anymore.  She reports she is happy with the fact that the Seroquel helped.  Patient reports sleep as good.  Does not take the trazodone.  The Seroquel does help.  Denies any significant depression symptoms.  Denies any suicidality, homicidality or perceptual disturbances.  Would like to reduce the dosage of Wellbutrin since she does not believe she needs an antidepressant at this time.  She does not feel depressed.  Patient reports continues to have relationship struggles with mother-in-law who stays in the same house.  That does cause her some situational stressors.  Patient denies any other concerns today.  Visit Diagnosis:    ICD-10-CM   1. MDD (major depressive disorder), recurrent, in full remission (HCC)  F33.42 buPROPion (WELLBUTRIN XL) 150 MG 24 hr tablet    2. GAD (generalized anxiety disorder)  F41.1     3. Brief psychotic disorder (HCC)  F23 QUEtiapine (SEROQUEL) 100 MG tablet    4. Insomnia due to medical condition  G47.01    Pain, RLS    5. At risk for prolonged QT interval syndrome  Z91.89       Past Psychiatric  History: Reviewed past psychiatric history from progress note on 08/06/2017.  Past trials of Latuda, Wellbutrin, Cymbalta.  Past Medical History:  Past Medical History:  Diagnosis Date   Anemia    in the past   Anxiety    Arthritis    all over   Cervical spondylosis with myelopathy 2018   Chronic pain 2019   can't stand straight, uses walker for stability   DDD (degenerative disc disease), lumbar    Depression    Displacement of cervical intervertebral disc 2018   Dyspnea    HANDICAPPED   Fibromyalgia    Polymyalgia Rheumatica   GERD (gastroesophageal reflux disease)    Heart murmur    not treated or being followed   Hypertension    CONTROLLED   Hypothyroidism    Lumbar post-laminectomy syndrome 2018   Lumbosacral radiculitis 2018   Neuropathy due to medical condition (HCC)    feet, toes, fingers   Osteoarthritis    Osteoporosis    Other long term (current) drug therapy 2019   from pain management   Pain in the coccyx 2018   disorder of sacrum   Sleep apnea    Spondylolisthesis 2018   Spondylosis of lumbosacral region without myelopathy or radiculopathy 2018   Spondylosis with myelopathy, lumbar region 2018   Vitamin D deficiency     Past Surgical History:  Procedure Laterality Date   ABDOMINAL HYSTERECTOMY  2000   APPENDECTOMY     arm  surgery  2002   BREAST BIOPSY Left 2005   benign   CATARACT EXTRACTION W/PHACO Right 10/21/2017   Procedure: CATARACT EXTRACTION PHACO AND INTRAOCULAR LENS PLACEMENT (IOC) RIGHT;  Surgeon: Lockie Mola, MD;  Location: [facility];  Service: Ophthalmology;  Laterality: Right;  neck issues   CESAREAN SECTION     x 2   CHOLECYSTECTOMY     COLONOSCOPY WITH PROPOFOL N/A 08/25/2017   Procedure: COLONOSCOPY WITH PROPOFOL;  Surgeon: Toledo, Boykin Nearing, MD;  Location: [facility];  Service: Gastroenterology;  Laterality: N/A;   COLONOSCOPY WITH PROPOFOL N/A 02/25/2022   Procedure: COLONOSCOPY WITH PROPOFOL;  Surgeon:  Regis Bill, MD;  Location: [facility];  Service: Endoscopy;  Laterality: N/A;   epidural injection  2018   steroids   GASTRIC BYPASS  2004   JOINT REPLACEMENT Bilateral 2005   knees   LUMBAR FUSION  2016   x 2/ L3-5   SHOULDER ARTHROSCOPY WITH ROTATOR CUFF REPAIR Right 2008   x 3   SPINAL CORD STIMULATOR IMPLANT  2018   [employer]   TEMPORAL ARTERY BIOPSY / LIGATION     TONSILLECTOMY      Family Psychiatric History: Reviewed family psychiatric history from progress note on 08/06/2017.  Family History:  Family History  Problem Relation Age of Onset   Kidney disease Mother    Cancer Mother    Anxiety disorder Mother    Depression Mother    Breast cancer Mother    Cancer Father    Hypertension Father    Cancer Brother    Breast cancer Maternal Aunt    Breast cancer Cousin     Social History: Reviewed social history from progress note on 08/06/2017. Social History   Socioeconomic History   Marital status: Married    Spouse name: Connie [name]    Number of children: 2   Years of education: Not on file   Highest education level: High school graduate  Occupational History    Comment: disabled   Tobacco Use   Smoking status: Former    Packs/day: 0.50    Years: 20.00    Total pack years: 10.00    Types: Cigarettes    Quit date: 05/21/2007    Years since quitting: 14.9   Smokeless tobacco: Never  Vaping Use   Vaping Use: Never used  Substance and Sexual Activity   Alcohol use: No   Drug use: No    Comment: managed by pain clinic   Sexual activity: Not Currently  Other Topics Concern   Not on file  Social History Narrative   Not on file   Social Determinants of Health   Financial Resource Strain: Medium Risk (05/20/2017)   Overall Financial Resource Strain (CARDIA)    Difficulty of Paying Living Expenses: Somewhat hard  Food Insecurity: No Food Insecurity (05/20/2017)   Hunger Vital Sign    Worried About Running Out of Food in the Last Year: Never  true    Ran Out of Food in the Last Year: Never true  Transportation Needs: No Transportation Needs (05/20/2017)   PRAPARE - Administrator, Civil Service (Medical): No    Lack of Transportation (Non-Medical): No  Physical Activity: Inactive (05/20/2017)   Exercise Vital Sign    Days of Exercise per Week: 0 days    Minutes of Exercise per Session: 0 min  Stress: Not on file  Social Connections: Moderately Integrated (05/20/2017)   Social Connection and Isolation Panel [NHANES]  Frequency of Communication with Friends and Family: Three times a week    Frequency of Social Gatherings with Friends and Family: Once a week    Attends Religious Services: More than 4 times per year    Active Member of [employer] or Organizations: No    Attends Banker Meetings: Never    Marital Status: Married    Allergies:  Allergies  Allergen Reactions   Pedi-Pre Tape Spray [Wound Dressing Adhesive] Rash    Metabolic Disorder Labs: No results found for: "HGBA1C", "MPG" No results found for: "PROLACTIN" No results found for: "CHOL", "TRIG", "HDL", "CHOLHDL", "VLDL", "LDLCALC" No results found for: "TSH"  Therapeutic Level Labs: No results found for: "LITHIUM" No results found for: "VALPROATE" No results found for: "CBMZ"  Current Medications: Current Outpatient Medications  Medication Sig Dispense Refill   buPROPion (WELLBUTRIN XL) 150 MG 24 hr tablet Take 1 tablet (150 mg total) by mouth daily. 90 tablet 0   celecoxib (CELEBREX) 100 MG capsule Take by mouth.     furosemide (LASIX) 40 MG tablet Take by mouth.     levorphanol (LEVODROMORAN) 2 MG tablet Take 1 tablet (2 mg total) by mouth every 8 (eight) hours as needed for pain. 90 tablet 0   levothyroxine (SYNTHROID) 50 MCG tablet      metoprolol succinate (TOPROL-XL) 100 MG 24 hr tablet Take 100 mg by mouth once daily in the evening  0   mometasone (ELOCON) 0.1 % cream Apply to affected areas rash on legs and left elbow once  to twice daily as needed for itch. Avoid face, groin, underarms. 45 g 1   omeprazole (PRILOSEC) 20 MG capsule Take 20 mg by mouth daily.     pregabalin (LYRICA) 150 MG capsule Take 150 mg by mouth 3 (three) times daily.     vitamin B-12 (CYANOCOBALAMIN) 1000 MCG tablet Take by mouth.     [START ON 05/16/2022] levorphanol (LEVODROMORAN) 2 MG tablet Take 1 tablet (2 mg total) by mouth every 8 (eight) hours as needed for pain. (Patient not taking: Reported on 04/16/2022) 90 tablet 0   QUEtiapine (SEROQUEL) 100 MG tablet Take 1 tablet (100 mg total) by mouth at bedtime. 90 tablet 0   traZODone (DESYREL) 150 MG tablet Take 0.5-1 tablets (75-150 mg total) by mouth at bedtime as needed. (Patient not taking: Reported on 04/16/2022) 30 tablet 1   No current facility-administered medications for this visit.     Musculoskeletal: Strength & Muscle Tone: within normal limits Gait & Station:  walks with walker Patient leans: Front  Psychiatric Specialty Exam: Review of Systems  Musculoskeletal:  Positive for arthralgias.  Psychiatric/Behavioral: Negative.    All other systems reviewed and are negative.   Blood pressure 118/71, pulse 61, temperature 97.6 F (36.4 C), temperature source Oral, height 5' (1.524 m), weight 246 lb (111.6 kg).Body mass index is 48.04 kg/m.  General Appearance: Casual  Eye Contact:  Fair  Speech:  Normal Rate  Volume:  Normal  Mood:  Euthymic  Affect:  Congruent  Thought Process:  Goal Directed and Descriptions of Associations: Intact  Orientation:  Full (Time, Place, and Person)  Thought Content: Logical   Suicidal Thoughts:  No  Homicidal Thoughts:  No  Memory:  Immediate;   Fair Recent;   Fair Remote;   Fair  Judgement:  Fair  Insight:  Fair  Psychomotor Activity:  Normal  Concentration:  Concentration: Fair and Attention Span: Fair  Recall:  [employer] of Knowledge: Fair  Language: Fair  Akathisia:  No  Handed:  Right  AIMS (if indicated): done   Assets:  Communication Skills Desire for Improvement Housing Social Support  ADL's:  Intact  Cognition: WNL  Sleep:  Fair   Screenings: AIMS    Flowsheet Row Office Visit from 04/16/2022 in [facility] Office Visit from 02/12/2022 in [facility] Office Visit from 01/16/2022 in [facility]  AIMS Total Score 0 0 0      GAD-7    Flowsheet Row Office Visit from 04/16/2022 in [facility] Office Visit from 02/12/2022 in [facility] Office Visit from 01/16/2022 in [facility]  Total GAD-7 Score 2 3 4       PHQ2-9    Flowsheet Row Office Visit from 04/16/2022 in [facility] Office Visit from 02/12/2022 in [facility] Office Visit from 01/16/2022 in [facility] Procedure visit from 11/06/2021 in [facility] Office Visit from 08/27/2021 in [facility]  PHQ-2 Total Score 0 1 1 0 0  PHQ-9 Total Score 4 7 12  -- --      Flowsheet Row Office Visit from 04/16/2022 in [facility] Admission (Discharged) from 02/25/2022 in [facility] Office Visit from 02/12/2022 in [facility]  C-SSRS RISK CATEGORY No Risk No Risk No Risk        Assessment and Plan: Shelese Geesaman is a 66 year old Caucasian female with history of MDD, GAD, chronic pain, hypothyroidism, presented for medication management.  Patient currently denies any delusions or hallucinations, with good response to Seroquel will benefit from the following plan.  Plan  MDD in remission Reduce Wellbutrin XL to 150 mg p.o. daily Continue Seroquel 100 mg p.o. nightly for now.  Brief psychotic  disorder-resolved Continue Seroquel as prescribed. Discontinued Mirapex.  Unknown if this contributed to psychosis.  Insomnia-stable Seroquel 100 mg p.o. nightly Trazodone half tablet to 150 mg as needed.  She currently does not use it.  At risk for prolonged QT syndrome-patient has not been able to get EKG done.  Provided phone #[phone] again.  Patient advised to get it done.  Patient with elevated blood pressure reading in session, advised to follow up with primary care provider.  Have reduced the dose of Wellbutrin which may also help with lowering the blood pressure if it is due to the same.  Follow-up in clinic in 2 months or sooner if needed. This note was generated in part or whole with voice recognition software. Voice recognition is usually quite accurate but there are transcription errors that can and very often do occur. I apologize for any typographical errors that were not detected and corrected.    Jomarie Longs, MD 04/17/2022, 9:19 AM

## 2022-05-19 ENCOUNTER — Other Ambulatory Visit: Payer: Self-pay | Admitting: Student

## 2022-05-19 DIAGNOSIS — G253 Myoclonus: Secondary | ICD-10-CM

## 2022-05-28 ENCOUNTER — Ambulatory Visit
Admission: RE | Admit: 2022-05-28 | Discharge: 2022-05-28 | Disposition: A | Discharge status: Home / Self Care | Payer: Medicare Other | Source: Ambulatory Visit | Attending: Student | Admitting: Student

## 2022-05-28 DIAGNOSIS — G253 Myoclonus: Secondary | ICD-10-CM | POA: Insufficient documentation

## 2022-06-05 ENCOUNTER — Encounter: Payer: Self-pay | Admitting: Student in an Organized Health Care Education/Training Program

## 2022-06-05 ENCOUNTER — Ambulatory Visit
Payer: Medicare Other | Attending: Student in an Organized Health Care Education/Training Program | Admitting: Student in an Organized Health Care Education/Training Program

## 2022-06-05 VITALS — BP 145/76 | HR 80 | Temp 96.8°F | Resp 18 | Ht 60.0 in | Wt 246.0 lb

## 2022-06-05 DIAGNOSIS — G894 Chronic pain syndrome: Secondary | ICD-10-CM | POA: Diagnosis present

## 2022-06-05 DIAGNOSIS — Z9689 Presence of other specified functional implants: Secondary | ICD-10-CM | POA: Insufficient documentation

## 2022-06-05 DIAGNOSIS — M5416 Radiculopathy, lumbar region: Secondary | ICD-10-CM | POA: Diagnosis present

## 2022-06-05 DIAGNOSIS — Z981 Arthrodesis status: Secondary | ICD-10-CM | POA: Diagnosis present

## 2022-06-05 DIAGNOSIS — M4807 Spinal stenosis, lumbosacral region: Secondary | ICD-10-CM | POA: Diagnosis present

## 2022-06-05 MED ORDER — LEVORPHANOL TARTRATE 2 MG PO TABS: 2.0000 mg | ORAL_TABLET | Freq: Three times a day (TID) | ORAL | 0 refills | Status: DC | PRN

## 2022-06-05 MED ORDER — LEVORPHANOL TARTRATE 2 MG PO TABS: 2.0000 mg | ORAL_TABLET | Freq: Three times a day (TID) | ORAL | 0 refills | Status: AC | PRN

## 2022-06-05 NOTE — Progress Notes (Signed)
Nursing Pain Medication Assessment:  Safety precautions to be maintained throughout the outpatient stay will include: orient to surroundings, keep bed in low position, maintain call bell within reach at all times, provide assistance with transfer out of bed and ambulation.  Medication Inspection Compliance: Pill count conducted under aseptic conditions, in front of the patient. Neither the pills nor the bottle was removed from the patient's sight at any time. Once count was completed pills were immediately returned to the patient in their original bottle.  Medication:  Levorphanol Pill/Patch Count:  36 of 90 pills remain Pill/Patch Appearance: Markings consistent with prescribed medication Bottle Appearance: Standard pharmacy container. Clearly labeled. Filled Date: 01 / 19 / 2023 Last Medication intake:  Today

## 2022-06-05 NOTE — Progress Notes (Signed)
PROVIDER NOTE: Information contained herein reflects review and annotations entered in association with encounter. Interpretation of such information and data should be left to medically-trained personnel. Information provided to patient can be located elsewhere in the medical record under "Patient Instructions". Document created using STT-dictation technology, any transcriptional errors that may result from process are unintentional.    Patient: Connie [name]  Service Category: E/M  Provider: [name], MD  DOB: September 29, 1955  DOS: 06/05/2022  Specialty: Interventional Pain Management  MRN: [id number]  Setting: Ambulatory outpatient  PCP: Donnamarie Rossetti, PA-C  Type: Established Patient    Referring Provider: Donnamarie Rossetti,*  Location: Office  Delivery: Face-to-face     HPI  Connie [name], a 67 y.o. year old female, is here today because of her Lumbar radiculopathy [M54.16]. Connie [name] primary complain today is Shoulder Pain and Back Pain Last encounter: My last encounter with her was on 03/11/22 Pertinent problems: Connie [name] has Degenerative joint disease of cervical and lumbar spine; Lumbar spondylosis; History of lumbar fusion; Lumbar degenerative disc disease; Fibromyalgia; Chronic pain syndrome; Spinal cord stimulator status; Acquired spondylolisthesis; Chronic pain; Generalized osteoarthritis of multiple sites; Spinal stenosis of lumbar region without neurogenic claudication; and Lumbar post-laminectomy syndrome on their pertinent problem list. Pain Assessment: Severity of Chronic pain is reported as a 5 /10. Location: Back Lower/radiates down left leg to ankle. Onset: More than a month ago. Quality: Constant, Cramping. Timing: Constant. Modifying factor(s): repositioning. Vitals:  height is 5' (1.524 m) and weight is 246 lb (111.6 kg). Her temperature is 96.8 F (36 C) (abnormal). Her blood pressure is 145/76 (abnormal) and her pulse is 80. Her respiration is 18 and  oxygen saturation is 96%.   Reason for encounter: medication management.   [name] follows up today for medication management of her levorphanol.   No change in medical history since last visit.  Patient's pain is at baseline.  Patient continues multimodal pain regimen as prescribed.  States that it provides pain relief and improvement in functional status. Increased low back and radiating leg pain, previous caudal ESI in July which provided 80% pain relief for approx 6 months Otherwise pt is doing well  Pharmacotherapy Assessment  Analgesic: Levorphanol 2 mg BID  prn, #90/month    Monitoring: [place] PMP: PDMP reviewed during this encounter.       Pharmacotherapy: No side-effects or adverse reactions reported. Compliance: No problems identified. Effectiveness: Clinically acceptable.  Dewayne Shorter, RN  06/05/2022  2:45 PM  Sign when Signing Visit Nursing Pain Medication Assessment:  Safety precautions to be maintained throughout the outpatient stay will include: orient to surroundings, keep bed in low position, maintain call bell within reach at all times, provide assistance with transfer out of bed and ambulation.  Medication Inspection Compliance: Pill count conducted under aseptic conditions, in front of the patient. Neither the pills nor the bottle was removed from the patient's sight at any time. Once count was completed pills were immediately returned to the patient in their original bottle.  Medication:  Levorphanol Pill/Patch Count:  36 of 90 pills remain Pill/Patch Appearance: Markings consistent with prescribed medication Bottle Appearance: Standard pharmacy container. Clearly labeled. Filled Date: 01 / 19 / 2023 Last Medication intake:  Today    UDS:  Summary  Date Value Ref Range Status  01/07/2022 Note  Final    Comment:    ==================================================================== [employer] Select 13  (MW) ==================================================================== Test  Result       Flag       Units    NO DRUGS DETECTED. ==================================================================== Test                      Result    Flag   Units      Ref Range   Creatinine              49               mg/dL      >=20 ==================================================================== Declared Medications:  The flagging and interpretation on this report are based on the  following declared medications.  Unexpected results may arise from  inaccuracies in the declared medications.   **Note: The testing scope of this panel does not include the  following reported medications:   Azelastine (Astelin)  Bupropion (Wellbutrin)  Furosemide (Lasix)  Hydrocortisone  Levorphanol  Levothyroxine (Synthroid)  Metoprolol (Toprol)  Mometasone  Omeprazole (Prilosec)  Pramipexole (Mirapex)  Pregabalin (Lyrica)  Quetiapine (Seroquel)  Trazodone (Desyrel)  Vitamin B12 ==================================================================== For clinical consultation, please call [phone]. ====================================================================      ROS  Constitutional: Denies any fever or chills Gastrointestinal: No reported hemesis, hematochezia, vomiting, or acute GI distress Musculoskeletal:  Low back, bilateral leg pain Neurological: No reported episodes of acute onset apraxia, aphasia, dysarthria, agnosia, amnesia, paralysis, loss of coordination, or loss of consciousness  Medication Review  QUEtiapine, buPROPion, celecoxib, furosemide, levorphanol, levothyroxine, metoprolol succinate, omeprazole, pregabalin, and traZODone  History Review  Allergy: Connie [name] is allergic to pedi-pre tape spray [wound dressing adhesive]. Drug: Connie [name]  reports no history of drug use. Alcohol:  reports no history of alcohol use. Tobacco:  reports that  she quit smoking about 15 years ago. Her smoking use included cigarettes. She has a 10.00 pack-year smoking history. She has never used smokeless tobacco. Social: Connie [name]  reports that she quit smoking about 15 years ago. Her smoking use included cigarettes. She has a 10.00 pack-year smoking history. She has never used smokeless tobacco. She reports that she does not drink alcohol and does not use drugs. Medical:  has a past medical history of Anemia, Anxiety, Arthritis, Cervical spondylosis with myelopathy (2018), Chronic pain (2019), DDD (degenerative disc disease), lumbar, Depression, Displacement of cervical intervertebral disc (2018), Dyspnea, Fibromyalgia, GERD (gastroesophageal reflux disease), Heart murmur, Hypertension, Hypothyroidism, Lumbar post-laminectomy syndrome (2018), Lumbosacral radiculitis (2018), Neuropathy due to medical condition ([place]), Osteoarthritis, Osteoporosis, Other long term (current) drug therapy (2019), Pain in the coccyx (2018), Sleep apnea, Spondylolisthesis (2018), Spondylosis of lumbosacral region without myelopathy or radiculopathy (2018), Spondylosis with myelopathy, lumbar region (2018), and Vitamin D deficiency. Surgical: Connie [name]  has a past surgical history that includes Cesarean section; Gastric bypass (2004); Lumbar fusion (2016); Spinal cord stimulator implant (2018); Cholecystectomy; Appendectomy; Tonsillectomy; Shoulder arthroscopy with rotator cuff repair (Right, 2008); Joint replacement (Bilateral, 2005); Abdominal hysterectomy (2000); epidural injection (2018); arm surgery (2002); Breast biopsy (Left, 2005); Colonoscopy with propofol (N/A, 08/25/2017); Temporal artery biopsy / ligation; Cataract extraction w/PHACO (Right, 10/21/2017); and Colonoscopy with propofol (N/A, 02/25/2022). Family: family history includes Anxiety disorder in her mother; Breast cancer in her cousin, maternal aunt, and mother; Cancer in her brother, father, and mother; Depression in her  mother; Hypertension in her father; Kidney disease in her mother.  Laboratory Chemistry Profile   Renal Lab Results  Component Value Date   BUN 18 06/23/2017   CREATININE 0.60 06/23/2017   GFRAA >60 06/23/2017  GFRNONAA >60 06/23/2017    Hepatic Lab Results  Component Value Date   AST 20 06/23/2017   ALT 9 (L) 06/23/2017   ALBUMIN 3.6 06/23/2017   ALKPHOS 88 06/23/2017    Electrolytes Lab Results  Component Value Date   NA 135 06/23/2017   K 4.9 06/23/2017   CL 102 06/23/2017   CALCIUM 8.5 (L) 06/23/2017    Bone No results found for: "VD25OH", "VD125OH2TOT", "EX5284XL2", "GM0102VO5", "25OHVITD1", "25OHVITD2", "25OHVITD3", "TESTOFREE", "TESTOSTERONE"  Inflammation (CRP: Acute Phase) (ESR: Chronic Phase) No results found for: "CRP", "ESRSEDRATE", "LATICACIDVEN"       Note: Above Lab results reviewed.  Recent Imaging Review  CT CERVICAL SPINE WO CONTRAST CLINICAL DATA:  Worsening involuntary jerking x2 years. Mid head and neck pain with headaches.  EXAM: CT CERVICAL SPINE WITHOUT CONTRAST  TECHNIQUE: Multidetector CT imaging of the cervical spine was performed without intravenous contrast. Multiplanar CT image reconstructions were also generated.  RADIATION DOSE REDUCTION: This exam was performed according to the departmental dose-optimization program which includes automated exposure control, adjustment of the mA and/or kV according to patient size and/or use of iterative reconstruction technique.  COMPARISON:  CT cervical spine 07/24/2017.  FINDINGS: Alignment: Trace anterolisthesis of C6 on C7.  Skull base and vertebrae: Unremarkable skull base. Normal craniocervical junction. Degenerative endplate changes at D6-6 and C6-7. Normal vertebral body heights. No suspicious bone lesions.  Soft tissues and spinal canal: Atherosclerotic calcifications of the carotid bulbs.  Disc levels:  C2-C3:  Normal.  C3-C4: Small disc bulge without spinal canal  stenosis or neural foraminal narrowing.  C4-C5: No disc herniation, spinal canal stenosis or neural foraminal narrowing. Moderate bilateral facet arthropathy.  C5-C6: Central disc osteophyte complex results in mild spinal canal stenosis. Left-greater-than-right facet arthropathy and uncovertebral joint spurring results in moderate bilateral neural foraminal narrowing.  C6-C7: Severe disc height loss. No disc herniation or spinal canal stenosis. Left-greater-than-right facet arthropathy and uncovertebral joint spurring results in severe left and moderate right neural foraminal narrowing.  C7-T1: Small central disc osteophyte complex without spinal canal stenosis or neural foraminal narrowing.  Upper chest: Unremarkable.  Other: None.  IMPRESSION: 1. Multilevel degenerative changes of the cervical spine, most pronounced at C5-6 where there is mild spinal canal stenosis at C5-6 and moderate left neural foraminal narrowing. 2. Severe left and moderate right neural foraminal narrowing at C6-7.  Electronically Signed   By: Emmit Alexanders M.D.   On: 05/29/2022 09:31 Note: Reviewed        Physical Exam  General appearance: Well nourished, well developed, and well hydrated. In no apparent acute distress Mental status: Alert, oriented x 3 (person, place, & time)       Respiratory: No evidence of acute respiratory distress Eyes: PERLA Vitals: BP (!) 145/76   Pulse 80   Temp (!) 96.8 F (36 C)   Resp 18   Ht 5' (1.524 m)   Wt 246 lb (111.6 kg)   SpO2 96%   BMI 48.04 kg/m  BMI: Estimated body mass index is 48.04 kg/m as calculated from the following:   Height as of this encounter: 5' (1.524 m).   Weight as of this encounter: 246 lb (111.6 kg). Ideal: Ideal body weight: 45.5 kg (100 lb 4.9 oz) Adjusted ideal body weight: 71.9 kg (158 lb 9.4 oz)  Low back with radiating leg pain, dermatomal in nature  Assessment   Diagnosis Status  1. Lumbar radiculopathy   2. Chronic  pain syndrome   3. Spinal stenosis  of lumbosacral region   4. Spinal cord stimulator status   5. History of lumbar fusion      Persistent Persistent Persistent    Plan of Care  1. Lumbar radiculopathy - levorphanol (LEVODROMORAN) 2 MG tablet; Take 1 tablet (2 mg total) by mouth every 8 (eight) hours as needed for pain.  Dispense: 90 tablet; Refill: 0 - levorphanol (LEVODROMORAN) 2 MG tablet; Take 1 tablet (2 mg total) by mouth every 8 (eight) hours as needed for pain.  Dispense: 90 tablet; Refill: 0 - levorphanol (LEVODROMORAN) 2 MG tablet; Take 1 tablet (2 mg total) by mouth every 8 (eight) hours as needed for pain.  Dispense: 90 tablet; Refill: 0 - Caudal Epidural Injection; Future  2. Chronic pain syndrome - levorphanol (LEVODROMORAN) 2 MG tablet; Take 1 tablet (2 mg total) by mouth every 8 (eight) hours as needed for pain.  Dispense: 90 tablet; Refill: 0 - levorphanol (LEVODROMORAN) 2 MG tablet; Take 1 tablet (2 mg total) by mouth every 8 (eight) hours as needed for pain.  Dispense: 90 tablet; Refill: 0 - levorphanol (LEVODROMORAN) 2 MG tablet; Take 1 tablet (2 mg total) by mouth every 8 (eight) hours as needed for pain.  Dispense: 90 tablet; Refill: 0 - Caudal Epidural Injection; Future - [employer]ox[employer] Select 13 (MW), Urine  3. Spinal stenosis of lumbosacral region - levorphanol (LEVODROMORAN) 2 MG tablet; Take 1 tablet (2 mg total) by mouth every 8 (eight) hours as needed for pain.  Dispense: 90 tablet; Refill: 0 - levorphanol (LEVODROMORAN) 2 MG tablet; Take 1 tablet (2 mg total) by mouth every 8 (eight) hours as needed for pain.  Dispense: 90 tablet; Refill: 0 - levorphanol (LEVODROMORAN) 2 MG tablet; Take 1 tablet (2 mg total) by mouth every 8 (eight) hours as needed for pain.  Dispense: 90 tablet; Refill: 0 - Caudal Epidural Injection; Future  4. Spinal cord stimulator status  5. History of lumbar fusion   Connie [name] has a current medication list which includes  the following long-term medication(s): bupropion, levothyroxine, metoprolol succinate, omeprazole, pregabalin, quetiapine, and trazodone.  Pharmacotherapy (Medications Ordered): Meds ordered this encounter  Medications   levorphanol (LEVODROMORAN) 2 MG tablet    Sig: Take 1 tablet (2 mg total) by mouth every 8 (eight) hours as needed for pain.    Dispense:  90 tablet    Refill:  0   levorphanol (LEVODROMORAN) 2 MG tablet    Sig: Take 1 tablet (2 mg total) by mouth every 8 (eight) hours as needed for pain.    Dispense:  90 tablet    Refill:  0   levorphanol (LEVODROMORAN) 2 MG tablet    Sig: Take 1 tablet (2 mg total) by mouth every 8 (eight) hours as needed for pain.    Dispense:  90 tablet    Refill:  0   Orders:  Orders Placed This Encounter  Procedures   Caudal Epidural Injection    Standing Status:   Future    Standing Expiration Date:   09/03/2022    Scheduling Instructions:     Laterality: Midline     Level(s): Sacrococcygeal canal (Tailbone area)     Sedation: none     Scheduling Timeframe: As soon as pre-approved    Order Specific Question:   Where will this procedure be performed?    Answer:   [facility]   [employer] Select 13 (MW), Urine    Volume: 30 ml(s). Minimum 3 ml of urine is needed.  Document temperature of fresh sample. Indications: Long term (current) use of opiate analgesic [phone])    Order Specific Question:   Release to patient    Answer:   Immediate   Follow-up plan:   Return in about 2 weeks (around 06/19/2022) for caudal ESI, in clinic [facility].    Recent Visits Date Type Provider Dept  03/11/22 Office Visit [name], MD [facility]  Showing recent visits within past 90 days and meeting all other requirements Today's Visits Date Type Provider Dept  06/05/22 Office Visit [name], MD [facility]  Showing today's visits and meeting all other requirements Future Appointments No visits were found meeting these  conditions. Showing future appointments within next 90 days and meeting all other requirements  I discussed the assessment and treatment plan with the patient. The patient was provided an opportunity to ask questions and all were answered. The patient agreed with the plan and demonstrated an understanding of the instructions.  Patient advised to call back or seek an in-person evaluation if the symptoms or condition worsens.  Duration of encounter: 18mnutes.  Note by: BGillis Santa MD Date: 06/05/2022; Time: 3:42 PM

## 2022-06-05 NOTE — Patient Instructions (Signed)
______________________________________________________________________  Preparing for your procedure  During your procedure appointment there will be: No Prescription Refills. No disability issues to discussed. No medication changes or discussions.  Instructions: Food intake: Avoid eating anything solid for at least 8 hours prior to your procedure. Clear liquid intake: You may take clear liquids such as water up to 2 hours prior to your procedure. (No carbonated drinks. No soda.) Transportation: Unless otherwise stated by your physician, bring a driver. Morning Medicines: Except for blood thinners, take all of your other morning medications with a sip of water. Make sure to take your heart and blood pressure medicines. If your blood pressure's lower number is above 100, the case will be rescheduled. Blood thinners: Make sure to stop your blood thinners as instructed.  If you take a blood thinner, but were not instructed to stop it, call our office [phone] and ask to talk to a nurse. Not stopping a blood thinner prior to certain procedures could lead to serious complications. Diabetics on insulin: Notify the staff so that you can be scheduled 1st case in the morning. If your diabetes requires high dose insulin, take only  of your normal insulin dose the morning of the procedure and notify the staff that you have done so. Preventing infections: Shower with an antibacterial soap the morning of your procedure.  Build-up your immune system: Take 1000 mg of Vitamin C with every meal (3 times a day) the day prior to your procedure. Antibiotics: Inform the nursing staff if you are taking any antibiotics or if you have any conditions that may require antibiotics prior to procedures. (Example: recent joint implants)   Pregnancy: If you are pregnant make sure to notify the nursing staff. Not doing so may result in injury to the fetus, including death.  Sickness: If you have a cold, fever, or any  active infections, call and cancel or reschedule your procedure. Receiving steroids while having an infection may result in complications. Arrival: You must be in the facility at least 30 minutes prior to your scheduled procedure. Tardiness: Your scheduled time is also the cutoff time. If you do not arrive at least 15 minutes prior to your procedure, you will be rescheduled.  Children: Do not bring any children with you. Make arrangements to keep them home. Dress appropriately: There is always a possibility that your clothing may get soiled. Avoid long dresses. Valuables: Do not bring any jewelry or valuables.  Reasons to call and reschedule or cancel your procedure: (Following these recommendations will minimize the risk of a serious complication.) Surgeries: Avoid having procedures within 2 weeks of any surgery. (Avoid for 2 weeks before or after any surgery). Flu Shots: Avoid having procedures within 2 weeks of a flu shots or . (Avoid for 2 weeks before or after immunizations). Barium: Avoid having a procedure within 7-10 days after having had a radiological study involving the use of radiological contrast. (Myelograms, Barium swallow or enema study). Heart attacks: Avoid any elective procedures or surgeries for the initial 6 months after a "Myocardial Infarction" (Heart Attack). Blood thinners: It is imperative that you stop these medications before procedures. Let us know if you if you take any blood thinner.  Infection: Avoid procedures during or within two weeks of an infection (including chest colds or gastrointestinal problems). Symptoms associated with infections include: Localized redness, fever, chills, night sweats or profuse sweating, burning sensation when voiding, cough, congestion, stuffiness, runny nose, sore throat, diarrhea, nausea, vomiting, cold or Flu symptoms, recent or   current infections. It is specially important if the infection is over the area that we intend to treat. Heart  and lung problems: Symptoms that may suggest an active cardiopulmonary problem include: cough, chest pain, breathing difficulties or shortness of breath, dizziness, ankle swelling, uncontrolled high or unusually low blood pressure, and/or palpitations. If you are experiencing any of these symptoms, cancel your procedure and contact your primary care physician for an evaluation.  Remember:  Regular Business hours are:  Monday to Thursday 8:00 AM to 4:00 PM  Provider's Schedule: Francisco Naveira, MD:  Procedure days: Tuesday and Thursday 7:30 AM to 4:00 PM  Bilal Lateef, MD:  Procedure days: Monday and Wednesday 7:30 AM to 4:00 PM  ______________________________________________________________________    

## 2022-06-09 LAB — TOXASSURE SELECT 13 (MW), URINE

## 2022-06-17 ENCOUNTER — Ambulatory Visit (INDEPENDENT_AMBULATORY_CARE_PROVIDER_SITE_OTHER): Payer: Medicare Other | Admitting: Psychiatry

## 2022-06-17 ENCOUNTER — Encounter: Payer: Self-pay | Admitting: Psychiatry

## 2022-06-17 VITALS — BP 136/83 | HR 61 | Temp 97.8°F | Ht 60.0 in | Wt 251.6 lb

## 2022-06-17 DIAGNOSIS — F411 Generalized anxiety disorder: Secondary | ICD-10-CM

## 2022-06-17 DIAGNOSIS — F3342 Major depressive disorder, recurrent, in full remission: Secondary | ICD-10-CM | POA: Diagnosis not present

## 2022-06-17 DIAGNOSIS — F23 Brief psychotic disorder: Secondary | ICD-10-CM | POA: Diagnosis not present

## 2022-06-17 DIAGNOSIS — G4701 Insomnia due to medical condition: Secondary | ICD-10-CM

## 2022-06-17 NOTE — Progress Notes (Signed)
BH MD OP Progress Note  06/17/2022 3:00 PM Connie [name]  MRN:  [id number]  Chief Complaint:  Chief Complaint  Patient presents with   Follow-up   Anxiety   Depression   Medication Refill   HPI: Connie [name] is a 67 year old Caucasian female, married, lives in [place], has a history of depression, anxiety, insomnia, obstructive sleep apnea on CPAP, fibromyalgia, hypothyroidism, polyarthralgia was evaluated in office today.  Patient today reports she is currently doing well with regards to her mood.  Denies any significant depression, anxiety symptoms.  Denies any hallucinations, did not appear to be preoccupied with any delusions.  She reports she is currently sleeping better.  She however was advised to get a repeat sleep study.  She is not sure why.  Denies any restless leg symptoms.  Patient reports she currently is compliant on her medications as prescribed.  Denies side effects.  3 word memory immediate 3 out of 3, after 5 minutes 2 out of 3.  Attention and focus seem to be limited, unable to do serial sevens.  However was able to spell the word world forward and backward.  Reports she has a daughter currently  close to her for social support, daughter moved from [place] to [place] recently and she has been spending a lot of time with her daughter and grandchildren.  That has been extremely beneficial.  Patient denies any other concerns today.  Visit Diagnosis:    ICD-10-CM   1. MDD (major depressive disorder), recurrent, in full remission (HCC)  F33.42     2. GAD (generalized anxiety disorder)  F41.1     3. Brief psychotic disorder (HCC)  F23     4. Insomnia due to medical condition  G47.01    pain, RLS      Past Psychiatric History: Reviewed past psychiatric history from progress note on 08/06/2017.  Past trials of Latuda, Wellbutrin, Cymbalta.  Past Medical History:  Past Medical History:  Diagnosis Date   Anemia    in the past   Anxiety    Arthritis     all over   Cervical spondylosis with myelopathy 2018   Chronic pain 2019   can't stand straight, uses walker for stability   DDD (degenerative disc disease), lumbar    Depression    Displacement of cervical intervertebral disc 2018   Dyspnea    HANDICAPPED   Fibromyalgia    Polymyalgia Rheumatica   GERD (gastroesophageal reflux disease)    Heart murmur    not treated or being followed   Hypertension    CONTROLLED   Hypothyroidism    Lumbar post-laminectomy syndrome 2018   Lumbosacral radiculitis 2018   Neuropathy due to medical condition (HCC)    feet, toes, fingers   Osteoarthritis    Osteoporosis    Other long term (current) drug therapy 2019   from pain management   Pain in the coccyx 2018   disorder of sacrum   Sleep apnea    Spondylolisthesis 2018   Spondylosis of lumbosacral region without myelopathy or radiculopathy 2018   Spondylosis with myelopathy, lumbar region 2018   Vitamin D deficiency     Past Surgical History:  Procedure Laterality Date   ABDOMINAL HYSTERECTOMY  2000   APPENDECTOMY     arm surgery  2002   BREAST BIOPSY Left 2005   benign   CATARACT EXTRACTION W/PHACO Right 10/21/2017   Procedure: CATARACT EXTRACTION PHACO AND INTRAOCULAR LENS PLACEMENT (IOC) RIGHT;  Surgeon: Lockie Mola, MD;  Location: [facility];  Service: Ophthalmology;  Laterality: Right;  neck issues   CESAREAN SECTION     x 2   CHOLECYSTECTOMY     COLONOSCOPY WITH PROPOFOL N/A 08/25/2017   Procedure: COLONOSCOPY WITH PROPOFOL;  Surgeon: Toledo, Boykin Nearing, MD;  Location: [facility];  Service: Gastroenterology;  Laterality: N/A;   COLONOSCOPY WITH PROPOFOL N/A 02/25/2022   Procedure: COLONOSCOPY WITH PROPOFOL;  Surgeon: Regis Bill, MD;  Location: [facility];  Service: Endoscopy;  Laterality: N/A;   epidural injection  2018   steroids   GASTRIC BYPASS  2004   JOINT REPLACEMENT Bilateral 2005   knees   LUMBAR FUSION  2016   x 2/ L3-5    SHOULDER ARTHROSCOPY WITH ROTATOR CUFF REPAIR Right 2008   x 3   SPINAL CORD STIMULATOR IMPLANT  2018   [employer]   TEMPORAL ARTERY BIOPSY / LIGATION     TONSILLECTOMY      Family Psychiatric History: Reviewed family psychiatric history from progress note on 08/06/2017.  Family History:  Family History  Problem Relation Age of Onset   Kidney disease Mother    Cancer Mother    Anxiety disorder Mother    Depression Mother    Breast cancer Mother    Cancer Father    Hypertension Father    Cancer Brother    Breast cancer Maternal Aunt    Breast cancer Cousin     Social History: Reviewed social history from progress note on 08/06/2017. Social History   Socioeconomic History   Marital status: Married    Spouse name: brady    Number of children: 2   Years of education: Not on file   Highest education level: High school graduate  Occupational History    Comment: disabled   Tobacco Use   Smoking status: Former    Packs/day: 0.50    Years: 20.00    Total pack years: 10.00    Types: Cigarettes    Quit date: 05/21/2007    Years since quitting: 15.0   Smokeless tobacco: Never  Vaping Use   Vaping Use: Never used  Substance and Sexual Activity   Alcohol use: No   Drug use: No    Comment: managed by pain clinic   Sexual activity: Not Currently  Other Topics Concern   Not on file  Social History Narrative   Not on file   Social Determinants of Health   Financial Resource Strain: Medium Risk (05/20/2017)   Overall Financial Resource Strain (CARDIA)    Difficulty of Paying Living Expenses: Somewhat hard  Food Insecurity: No Food Insecurity (05/20/2017)   Hunger Vital Sign    Worried About Running Out of Food in the Last Year: Never true    Ran Out of Food in the Last Year: Never true  Transportation Needs: No Transportation Needs (05/20/2017)   PRAPARE - Administrator, Civil Service (Medical): No    Lack of Transportation (Non-Medical): No  Physical  Activity: Inactive (05/20/2017)   Exercise Vital Sign    Days of Exercise per Week: 0 days    Minutes of Exercise per Session: 0 min  Stress: Not on file  Social Connections: Moderately Integrated (05/20/2017)   Social Connection and Isolation Panel [NHANES]    Frequency of Communication with Friends and Family: Three times a week    Frequency of Social Gatherings with Friends and Family: Once a week    Attends Religious Services: More than 4 times per year  Active Member of Clubs or Organizations: No    Attends Banker Meetings: Never    Marital Status: Married    Allergies:  Allergies  Allergen Reactions   Pedi-Pre Tape Spray [Wound Dressing Adhesive] Rash    Metabolic Disorder Labs: No results found for: "HGBA1C", "MPG" No results found for: "PROLACTIN" No results found for: "CHOL", "TRIG", "HDL", "CHOLHDL", "VLDL", "LDLCALC" No results found for: "TSH"  Therapeutic Level Labs: No results found for: "LITHIUM" No results found for: "VALPROATE" No results found for: "CBMZ"  Current Medications: Current Outpatient Medications  Medication Sig Dispense Refill   buPROPion (WELLBUTRIN XL) 150 MG 24 hr tablet Take 1 tablet (150 mg total) by mouth daily. 90 tablet 0   celecoxib (CELEBREX) 100 MG capsule Take by mouth.     Cyanocobalamin (VITAMIN B-12 IJ) Inject as directed. Once a month     furosemide (LASIX) 40 MG tablet Take by mouth.     levorphanol (LEVODROMORAN) 2 MG tablet Take 1 tablet (2 mg total) by mouth every 8 (eight) hours as needed for pain. 90 tablet 0   [START ON 07/16/2022] levorphanol (LEVODROMORAN) 2 MG tablet Take 1 tablet (2 mg total) by mouth every 8 (eight) hours as needed for pain. 90 tablet 0   [START ON 08/15/2022] levorphanol (LEVODROMORAN) 2 MG tablet Take 1 tablet (2 mg total) by mouth every 8 (eight) hours as needed for pain. 90 tablet 0   levothyroxine (SYNTHROID) 50 MCG tablet      metoprolol succinate (TOPROL-XL) 100 MG 24 hr tablet  Take 100 mg by mouth once daily in the evening  0   omeprazole (PRILOSEC) 20 MG capsule Take 20 mg by mouth daily.     pregabalin (LYRICA) 150 MG capsule Take 150 mg by mouth 3 (three) times daily.     QUEtiapine (SEROQUEL) 100 MG tablet Take 1 tablet (100 mg total) by mouth at bedtime. 90 tablet 0   traZODone (DESYREL) 150 MG tablet Take 0.5-1 tablets (75-150 mg total) by mouth at bedtime as needed. 30 tablet 1   No current facility-administered medications for this visit.     Musculoskeletal: Strength & Muscle Tone: within normal limits Gait & Station:  Walks with walker Patient leans: Front  Psychiatric Specialty Exam: Review of Systems  Psychiatric/Behavioral: Negative.    All other systems reviewed and are negative.   Blood pressure 136/83, pulse 61, temperature 97.8 F (36.6 C), temperature source Skin, height 5' (1.524 m), weight 251 lb 9.6 oz (114.1 kg).Body mass index is 49.14 kg/m.  General Appearance: Casual  Eye Contact:  Good  Speech:  Normal Rate  Volume:  Normal  Mood:  Euthymic  Affect:  Appropriate  Thought Process:  Goal Directed and Descriptions of Associations: Intact  Orientation:  Full (Time, Place, and Person)  Thought Content: Logical   Suicidal Thoughts:  No  Homicidal Thoughts:  No  Memory:  Immediate;   Fair Recent;   Fair Remote;   Fair  Judgement:  Fair  Insight:  Fair  Psychomotor Activity:  Normal  Concentration:  Concentration: Fair and Attention Span: Fair  Recall:  [employer] of Knowledge: Fair  Language: Fair  Akathisia:  No  Handed:  Right  AIMS (if indicated): not done  Assets:  Communication Skills Desire for Improvement Housing Intimacy Resilience Social Support Talents/Skills Transportation  ADL's:  Intact  Cognition: WNL  Sleep:  Fair   Screenings: AIMS    Flowsheet Row Office Visit from 06/17/2022 in [facility] Office Visit from 04/16/2022 in [facility]  [facility] Office Visit from 02/12/2022 in [facility] Office Visit from 01/16/2022 in [facility]  AIMS Total Score 0 0 0 0      GAD-7    Flowsheet Row Office Visit from 06/17/2022 in [facility] Office Visit from 04/16/2022 in [facility] Office Visit from 02/12/2022 in [facility] Office Visit from 01/16/2022 in [facility]  Total GAD-7 Score 0 2 3 4       PHQ2-9    Flowsheet Row Office Visit from 06/17/2022 in [facility] Office Visit from 06/05/2022 in [facility] Visit from 04/16/2022 in [facility] Office Visit from 02/12/2022 in [facility] Office Visit from 01/16/2022 in [facility]  PHQ-2 Total Score 0 0 0 1 1  PHQ-9 Total Score 2 -- 4 7 12       Flowsheet Row Office Visit from 06/17/2022 in [facility] Office Visit from 04/16/2022 in [facility] Admission (Discharged) from 02/25/2022 in [facility]  C-SSRS RISK CATEGORY No Risk No Risk No Risk        Assessment and Plan: Milaysia Trahan is a 67 year old Caucasian female with history of MDD, GAD, chronic pain, hypothyroidism, was evaluated in office today.  Patient is currently stable.  Plan MDD in remission Wellbutrin XL 150 mg p.o. daily-reduced dosage Continue Seroquel 100 mg p.o. nightly.  Long-term plan is to taper it off.  Brief psychotic disorder-resolved Continue Seroquel for now.  Insomnia-stable Seroquel 100 mg p.o.  nightly Trazodone 150 mg as needed-takes half tablet as needed for now.  Does not use it daily. Patient reports she is currently awaiting a repeat sleep study.  At risk for prolonged QT syndrome-reviewed and discussed EKG-04/16/2022-sinus bradycardia QTc-409.  Will continue Seroquel.  Follow-up in clinic in 3 to 4 months or sooner if needed.   This note was generated in part or whole with voice recognition software. Voice recognition is usually quite accurate but there are transcription errors that can and very often do occur. I apologize for any typographical errors that were not detected and corrected.     Jomarie Longs, MD 06/18/2022, 9:19 AM

## 2022-07-02 ENCOUNTER — Ambulatory Visit
Admission: RE | Admit: 2022-07-02 | Discharge: 2022-07-02 | Disposition: A | Discharge status: Home / Self Care | Payer: Medicare Other | Source: Ambulatory Visit | Attending: Student in an Organized Health Care Education/Training Program | Admitting: Student in an Organized Health Care Education/Training Program

## 2022-07-02 ENCOUNTER — Ambulatory Visit
Payer: Medicare Other | Attending: Student in an Organized Health Care Education/Training Program | Admitting: Student in an Organized Health Care Education/Training Program

## 2022-07-02 ENCOUNTER — Encounter: Payer: Self-pay | Admitting: Student in an Organized Health Care Education/Training Program

## 2022-07-02 DIAGNOSIS — M5416 Radiculopathy, lumbar region: Secondary | ICD-10-CM | POA: Insufficient documentation

## 2022-07-02 DIAGNOSIS — G894 Chronic pain syndrome: Secondary | ICD-10-CM | POA: Diagnosis present

## 2022-07-02 DIAGNOSIS — M4807 Spinal stenosis, lumbosacral region: Secondary | ICD-10-CM | POA: Diagnosis present

## 2022-07-02 MED ORDER — DEXAMETHASONE SODIUM PHOSPHATE 10 MG/ML IJ SOLN
INTRAMUSCULAR | Status: AC
  Filled 2022-07-02: qty 1

## 2022-07-02 MED ORDER — LIDOCAINE HCL 2 % IJ SOLN
20.0000 mL | Freq: Once | INTRAMUSCULAR | Status: AC
  Administered 2022-07-02: 400 mg

## 2022-07-02 MED ORDER — DEXAMETHASONE SODIUM PHOSPHATE 10 MG/ML IJ SOLN
10.0000 mg | Freq: Once | INTRAMUSCULAR | Status: AC
  Administered 2022-07-02: 10 mg

## 2022-07-02 MED ORDER — ROPIVACAINE HCL 2 MG/ML IJ SOLN
2.0000 mL | Freq: Once | INTRAMUSCULAR | Status: AC
  Administered 2022-07-02: 2 mL via EPIDURAL

## 2022-07-02 MED ORDER — IOHEXOL 180 MG/ML  SOLN
10.0000 mL | Freq: Once | INTRAMUSCULAR | Status: AC
  Administered 2022-07-02: 10 mL via EPIDURAL

## 2022-07-02 MED ORDER — ROPIVACAINE HCL 2 MG/ML IJ SOLN
INTRAMUSCULAR | Status: AC
  Filled 2022-07-02: qty 20

## 2022-07-02 MED ORDER — IOHEXOL 180 MG/ML  SOLN
INTRAMUSCULAR | Status: AC
  Filled 2022-07-02: qty 20

## 2022-07-02 MED ORDER — SODIUM CHLORIDE 0.9% FLUSH
2.0000 mL | Freq: Once | INTRAVENOUS | Status: AC
  Administered 2022-07-02: 2 mL

## 2022-07-02 MED ORDER — LIDOCAINE HCL 2 % IJ SOLN
INTRAMUSCULAR | Status: AC
  Filled 2022-07-02: qty 20

## 2022-07-02 MED ORDER — SODIUM CHLORIDE (PF) 0.9 % IJ SOLN
INTRAMUSCULAR | Status: AC
  Filled 2022-07-02: qty 10

## 2022-07-02 NOTE — Progress Notes (Signed)
PROVIDER NOTE: Interpretation of information contained herein should be left to medically-trained personnel. Specific patient instructions are provided elsewhere under "Patient Instructions" section of medical record. This document was created in part using STT-dictation technology, any transcriptional errors that may result from this process are unintentional.  Patient: Connie [name] Type: Established DOB: December 15, 1955 MRN: [id number] PCP: Donnamarie Rossetti, PA-C  Service: Procedure DOS: 07/02/2022 Setting: Ambulatory Location: Ambulatory outpatient facility Delivery: Face-to-face Provider: [name], MD Specialty: Interventional Pain Management Specialty designation: 09 Location: Outpatient facility Ref. Prov.: [name], MD    Primary Reason for Visit: Interventional Pain Management Treatment. CC: Back Pain and Shoulder Pain    Procedure:          Anesthesia, Analgesia, Anxiolysis:  Type: Therapeutic Epidural Steroid Injection  Region: Caudal Level: Sacrococcygeal   Laterality: Midline aiming at the left  Anesthesia: Local (1-2% Lidocaine)  Anxiolysis: None  Sedation: None  Guidance: Fluoroscopy           Position: Prone   1. Lumbar radiculopathy   2. Chronic pain syndrome   3. Spinal stenosis of lumbosacral region    NAS-11 Pain score:   Pre-procedure: 6 /10   Post-procedure: 5 /10     Pre-op H&P Assessment:  Connie [name] is a 67 y.o. (year old), female patient, seen today for interventional treatment. She  has a past surgical history that includes Cesarean section; Gastric bypass (2004); Lumbar fusion (2016); Spinal cord stimulator implant (2018); Cholecystectomy; Appendectomy; Tonsillectomy; Shoulder arthroscopy with rotator cuff repair (Right, 2008); Joint replacement (Bilateral, 2005); Abdominal hysterectomy (2000); epidural injection (2018); arm surgery (2002); Breast biopsy (Left, 2005); Colonoscopy with propofol (N/A, 08/25/2017); Temporal artery biopsy /  ligation; Cataract extraction w/PHACO (Right, 10/21/2017); and Colonoscopy with propofol (N/A, 02/25/2022). Connie [name] has a current medication list which includes the following prescription(s): bupropion, celecoxib, cyanocobalamin, furosemide, levorphanol, [START ON 07/16/2022] levorphanol, [START ON 08/15/2022] levorphanol, levothyroxine, metoprolol succinate, omeprazole, pregabalin, quetiapine, and trazodone. Her primarily concern today is the Back Pain and Shoulder Pain  Initial Vital Signs:  Pulse/HCG Rate: 64  Temp: (!) 97.1 F (36.2 C) Resp: 17 BP:  (!) 133/53 SpO2: 96 %  BMI: Estimated body mass index is 48.82 kg/m as calculated from the following:   Height as of this encounter: 5' (1.524 m).   Weight as of this encounter: 250 lb (113.4 kg).  Risk Assessment: Allergies: Reviewed. She is allergic to pedi-pre tape spray [wound dressing adhesive].  Allergy Precautions: None required Coagulopathies: Reviewed. None identified.  Blood-thinner therapy: None at this time Active Infection(s): Reviewed. None identified. Connie [name] is afebrile  Site Confirmation: Connie [name] was asked to confirm the procedure and laterality before marking the site Procedure checklist: Completed Consent: Before the procedure and under the influence of no sedative(s), amnesic(s), or anxiolytics, the patient was informed of the treatment options, risks and possible complications. To fulfill our ethical and legal obligations, as recommended by the American Medical Association's Code of Ethics, I have informed the patient of my clinical impression; the nature and purpose of the treatment or procedure; the risks, benefits, and possible complications of the intervention; the alternatives, including doing nothing; the risk(s) and benefit(s) of the alternative treatment(s) or procedure(s); and the risk(s) and benefit(s) of doing nothing. The patient was provided information about the general risks and possible  complications associated with the procedure. These may include, but are not limited to: failure to achieve desired goals, infection, bleeding, organ or nerve damage, allergic reactions, paralysis, and death. In  addition, the patient was informed of those risks and complications associated to Spine-related procedures, such as failure to decrease pain; infection (i.e.: Meningitis, epidural or intraspinal abscess); bleeding (i.e.: epidural hematoma, subarachnoid hemorrhage, or any other type of intraspinal or peri-dural bleeding); organ or nerve damage (i.e.: Any type of peripheral nerve, nerve root, or spinal cord injury) with subsequent damage to sensory, motor, and/or autonomic systems, resulting in permanent pain, numbness, and/or weakness of one or several areas of the body; allergic reactions; (i.e.: anaphylactic reaction); and/or death. Furthermore, the patient was informed of those risks and complications associated with the medications. These include, but are not limited to: allergic reactions (i.e.: anaphylactic or anaphylactoid reaction(s)); adrenal axis suppression; blood sugar elevation that in diabetics may result in ketoacidosis or comma; water retention that in patients with history of congestive heart failure may result in shortness of breath, pulmonary edema, and decompensation with resultant heart failure; weight gain; swelling or edema; medication-induced neural toxicity; particulate matter embolism and blood vessel occlusion with resultant organ, and/or nervous system infarction; and/or aseptic necrosis of one or more joints. Finally, the patient was informed that Medicine is not an exact science; therefore, there is also the possibility of unforeseen or unpredictable risks and/or possible complications that may result in a catastrophic outcome. The patient indicated having understood very clearly. We have given the patient no guarantees and we have made no promises. Enough time was given to the  patient to ask questions, all of which were answered to the patient's satisfaction. Connie [name] has indicated that she wanted to continue with the procedure. Attestation: I, the ordering provider, attest that I have discussed with the patient the benefits, risks, side-effects, alternatives, likelihood of achieving goals, and potential problems during recovery for the procedure that I have provided informed consent. Date  Time: 07/02/2022 10:49 AM  Pre-Procedure Preparation:  Monitoring: As per clinic protocol. Respiration, ETCO2, SpO2, BP, heart rate and rhythm monitor placed and checked for adequate function Safety Precautions: Patient was assessed for positional comfort and pressure points before starting the procedure. Time-out: I initiated and conducted the "Time-out" before starting the procedure, as per protocol. The patient was asked to participate by confirming the accuracy of the "Time Out" information. Verification of the correct person, site, and procedure were performed and confirmed by me, the nursing staff, and the patient. "Time-out" conducted as per Joint Commission's Universal Protocol (UP.01.01.01). Time: 1123  Description of Procedure:          Target Area: Caudal Epidural Canal. Approach: Midline approach. Area Prepped: Entire Posterior Sacrococcygeal Region DuraPrep (Iodine Povacrylex [0.7% available iodine] and Isopropyl Alcohol, 74% w/w) Safety Precautions: Aspiration looking for blood return was conducted prior to all injections. At no point did we inject any substances, as a needle was being advanced. No attempts were made at seeking any paresthesias. Safe injection practices and needle disposal techniques used. Medications properly checked for expiration dates. SDV (single dose vial) medications used. Description of the Procedure: Protocol guidelines were followed. The patient was placed in position over the fluoroscopy table. The target area was identified and the area  prepped in the usual manner. Skin & deeper tissues infiltrated with local anesthetic. Appropriate amount of time allowed to pass for local anesthetics to take effect. The procedure needles were then advanced to the target area. Proper needle placement secured. Negative aspiration confirmed. Solution injected in intermittent fashion, asking for systemic symptoms every 0.5cc of injectate. The needles were then removed and the area cleansed, making sure  to leave some of the prepping solution back to take advantage of its long term bactericidal properties. Vitals:   07/02/22 1053 07/02/22 1117 07/02/22 1127 07/02/22 1133  BP: (!) 133/53 121/80 (!) 114/54 115/72  Pulse: 64 65 64 66  Resp: '17 13 13 15  '$ Temp: (!) 97.1 F (36.2 C)     TempSrc: Temporal     SpO2: 96% 97% 98% 98%  Weight: 250 lb (113.4 kg)     Height: 5' (1.524 m)       Start Time: 1123 hrs. End Time: 1127 hrs. Materials:  Tray: Epidural Needle(s) Type: Epidural needle Gauge: 22G Length: 3.5-in  6 cc solution made of 3 cc of preservative-free saline, 2 cc of 0.2% ropivacaine, 1 cc of Decadron 10 mg/cc.  Imaging Guidance (Spinal):          Type of Imaging Technique: Fluoroscopy Guidance (Spinal) Indication(s): Assistance in needle guidance and placement for procedures requiring needle placement in or near specific anatomical locations not easily accessible without such assistance. Exposure Time: Please see nurses notes. Contrast: Before injecting any contrast, we confirmed that the patient did not have an allergy to iodine, shellfish, or radiological contrast. Once satisfactory needle placement was completed at the desired level, radiological contrast was injected. Contrast injected under live fluoroscopy. No contrast complications. See chart for type and volume of contrast used. Fluoroscopic Guidance: I was personally present during the use of fluoroscopy. "Tunnel Vision Technique" used to obtain the best possible view of the  target area. Parallax error corrected before commencing the procedure. "Direction-depth-direction" technique used to introduce the needle under continuous pulsed fluoroscopy. Once target was reached, antero-posterior, oblique, and lateral fluoroscopic projection used confirm needle placement in all planes. Images permanently stored in EMR. Interpretation: I personally interpreted the imaging intraoperatively. Adequate needle placement confirmed in multiple planes. Appropriate spread of contrast into desired area was observed. No evidence of afferent or efferent intravascular uptake. No intrathecal or subarachnoid spread observed. Permanent images saved into the patient's record.   Post-operative Assessment:  Post-procedure Vital Signs:  Pulse/HCG Rate: 66  Temp: (!) 97.1 F (36.2 C) Resp: 15 BP:  115/72 SpO2: 98 %  EBL: None  Complications: No immediate post-treatment complications observed by team, or reported by patient.  Note: The patient tolerated the entire procedure well. A repeat set of vitals were taken after the procedure and the patient was kept under observation following institutional policy, for this type of procedure. Post-procedural neurological assessment was performed, showing return to baseline, prior to discharge. The patient was provided with post-procedure discharge instructions, including a section on how to identify potential problems. Should any problems arise concerning this procedure, the patient was given instructions to immediately contact us, at any time, without hesitation. In any case, we plan to contact the patient by telephone for a follow-up status report regarding this interventional procedure.  Comments:  No additional relevant information.     Plan of Care  Orders:  Orders Placed This Encounter  Procedures   DG PAIN CLINIC C-ARM 1-60 MIN NO REPORT    Intraoperative interpretation by procedural physician at [place].    Standing Status:    Standing    Number of Occurrences:   1    Order Specific Question:   Reason for exam:    Answer:   Assistance in needle guidance and placement for procedures requiring needle placement in or near specific anatomical locations not easily accessible without such assistance.   Chronic Opioid Analgesic:  Percocet 10 mg TID prn MME=45   Medications ordered for procedure: Meds ordered this encounter  Medications   iohexol (OMNIPAQUE) 180 MG/ML injection 10 mL    Must be Myelogram-compatible. If not available, you may substitute with a water-soluble, non-ionic, hypoallergenic, myelogram-compatible radiological contrast medium.   lidocaine (XYLOCAINE) 2 % (with pres) injection 400 mg   ropivacaine (PF) 2 mg/mL (0.2%) (NAROPIN) injection 2 mL   sodium chloride flush (NS) 0.9 % injection 2 mL   dexamethasone (DECADRON) injection 10 mg   Medications administered: We administered iohexol, lidocaine, ropivacaine (PF) 2 mg/mL (0.2%), sodium chloride flush, and dexamethasone.  See the medical record for exact dosing, route, and time of administration.  Follow-up plan:   Return in about 4 weeks (around 07/30/2022) for PPE 4 weeks.     Recent Visits Date Type Provider Dept  06/05/22 Office Visit [name], MD [facility]  Showing recent visits within past 90 days and meeting all other requirements Today's Visits Date Type Provider Dept  07/02/22 Procedure visit [name], MD [facility]  Showing today's visits and meeting all other requirements Future Appointments Date Type Provider Dept  08/04/22 Appointment [name], MD [facility]  09/11/22 Appointment [name], MD [facility]  Showing future appointments within next 90 days and meeting all other requirements  Disposition: Discharge home  Discharge (Date  Time): 07/02/2022; 1134 hrs.   Primary Care Physician: Donnamarie Rossetti, PA-C Location: [facility]  Facility Note by: [name], MD Date: 07/02/2022; Time: 11:34 AM  Disclaimer:  Medicine is not an exact science. The only guarantee in medicine is that nothing is guaranteed. It is important to note that the decision to proceed with this intervention was based on the information collected from the patient. The Data and conclusions were drawn from the patient's questionnaire, the interview, and the physical examination. Because the information was provided in large part by the patient, it cannot be guaranteed that it has not been purposely or unconsciously manipulated. Every effort has been made to obtain as much relevant data as possible for this evaluation. It is important to note that the conclusions that lead to this procedure are derived in large part from the available data. Always take into account that the treatment will also be dependent on availability of resources and existing treatment guidelines, considered by other Pain Management Practitioners as being common knowledge and practice, at the time of the intervention. For Medico-Legal purposes, it is also important to point out that variation in procedural techniques and pharmacological choices are the acceptable norm. The indications, contraindications, technique, and results of the above procedure should only be interpreted and judged by a Board-Certified Interventional Pain Specialist with extensive familiarity and expertise in the same exact procedure and technique.

## 2022-07-02 NOTE — Patient Instructions (Signed)

## 2022-07-02 NOTE — Progress Notes (Signed)
Safety precautions to be maintained throughout the outpatient stay will include: orient to surroundings, keep bed in low position, maintain call bell within reach at all times, provide assistance with transfer out of bed and ambulation.  

## 2022-07-03 ENCOUNTER — Telehealth: Payer: Self-pay

## 2022-07-03 NOTE — Telephone Encounter (Signed)
Post procedure follow up. LM 

## 2022-07-14 ENCOUNTER — Other Ambulatory Visit: Payer: Self-pay | Admitting: Psychiatry

## 2022-07-14 DIAGNOSIS — F23 Brief psychotic disorder: Secondary | ICD-10-CM

## 2022-07-15 ENCOUNTER — Telehealth: Payer: Self-pay | Admitting: Psychiatry

## 2022-07-15 DIAGNOSIS — F3342 Major depressive disorder, recurrent, in full remission: Secondary | ICD-10-CM

## 2022-07-15 DIAGNOSIS — F23 Brief psychotic disorder: Secondary | ICD-10-CM

## 2022-07-15 MED ORDER — QUETIAPINE FUMARATE 100 MG PO TABS: 100.0000 mg | ORAL_TABLET | Freq: Every day | ORAL | 1 refills | Status: DC

## 2022-07-15 MED ORDER — BUPROPION HCL ER (XL) 150 MG PO TB24: 150.0000 mg | ORAL_TABLET | Freq: Every day | ORAL | 1 refills | Status: DC

## 2022-07-15 NOTE — Telephone Encounter (Signed)
I have sent Seroquel and Wellbutrin to [place].

## 2022-07-15 NOTE — Telephone Encounter (Signed)
Pt.notified

## 2022-08-04 ENCOUNTER — Encounter: Payer: Self-pay | Admitting: Student in an Organized Health Care Education/Training Program

## 2022-08-04 ENCOUNTER — Ambulatory Visit
Payer: Medicare Other | Attending: Student in an Organized Health Care Education/Training Program | Admitting: Student in an Organized Health Care Education/Training Program

## 2022-08-04 DIAGNOSIS — M4807 Spinal stenosis, lumbosacral region: Secondary | ICD-10-CM

## 2022-08-04 DIAGNOSIS — G894 Chronic pain syndrome: Secondary | ICD-10-CM | POA: Diagnosis not present

## 2022-08-04 DIAGNOSIS — M5416 Radiculopathy, lumbar region: Secondary | ICD-10-CM | POA: Diagnosis not present

## 2022-08-04 MED ORDER — LEVORPHANOL TARTRATE 2 MG PO TABS: 2.0000 mg | ORAL_TABLET | Freq: Three times a day (TID) | ORAL | 0 refills | Status: DC | PRN

## 2022-08-04 MED ORDER — LEVORPHANOL TARTRATE 2 MG PO TABS: 2.0000 mg | ORAL_TABLET | Freq: Three times a day (TID) | ORAL | 0 refills | Status: AC | PRN

## 2022-08-04 NOTE — Patient Instructions (Signed)

## 2022-08-04 NOTE — Progress Notes (Signed)
Safety precautions to be maintained throughout the outpatient stay will include: orient to surroundings, keep bed in low position, maintain call bell within reach at all times, provide assistance with transfer out of bed and ambulation.  

## 2022-08-04 NOTE — Progress Notes (Signed)
PROVIDER NOTE: Information contained herein reflects review and annotations entered in association with encounter. Interpretation of such information and data should be left to medically-trained personnel. Information provided to patient can be located elsewhere in the medical record under "Patient Instructions". Document created using STT-dictation technology, any transcriptional errors that may result from process are unintentional.    Patient: Connie [name]  Service Category: E/M  Provider: Edward Jolly, MD  DOB: Apr 19, 1956  DOS: 08/04/2022  Referring Provider: Wilford Corner  MRN: [id number]  Specialty: Interventional Pain Management  PCP: Wilford Corner, PA-C  Type: Established Patient  Setting: Ambulatory outpatient    Location: Office  Delivery: Face-to-face     HPI  [name], a 67 y.o. year old female, is here today because of her No primary diagnosis found.. Ms. Bowes primary complain today is Back Pain  Pertinent problems: Ms. Kyriacou has Degenerative joint disease of cervical and lumbar spine; Lumbar spondylosis; History of lumbar fusion; Lumbar degenerative disc disease; Fibromyalgia; Chronic pain syndrome; Spinal cord stimulator status; Acquired spondylolisthesis; Chronic pain; Generalized osteoarthritis of multiple sites; Spinal stenosis of lumbar region without neurogenic claudication; and Lumbar post-laminectomy syndrome on their pertinent problem list. Pain Assessment: Severity of Chronic pain is reported as a 6 /10. Location: Back Lower, Left/radiates down left leg to left hip. Onset: More than a month ago. Quality: Constant. Timing: Constant. Modifying factor(s): sitting and laying on right side. Vitals:  height is 5' (1.524 m) and weight is 250 lb (113.4 kg). Her temporal temperature is 97.1 F (36.2 C) (abnormal). Her blood pressure is 98/56 (abnormal) and her pulse is 67. Her respiration is 18.  BMI: Estimated body mass index is 48.82 kg/m as  calculated from the following:   Height as of this encounter: 5' (1.524 m).   Weight as of this encounter: 250 lb (113.4 kg). Last encounter: 06/05/2022. Last procedure: 07/02/2022.  Reason for encounter: both, medication management and post-procedure evaluation and assessment.    Post-procedure evaluation    Procedure:          Anesthesia, Analgesia, Anxiolysis:  Type: Therapeutic Epidural Steroid Injection  Region: Caudal Level: Sacrococcygeal   Laterality: Midline aiming at the left  Anesthesia: Local (1-2% Lidocaine)  Anxiolysis: None  Sedation: None  Guidance: Fluoroscopy           Position: Prone   1. Lumbar radiculopathy   2. Chronic pain syndrome   3. Spinal stenosis of lumbosacral region    NAS-11 Pain score:   Pre-procedure: 6 /10   Post-procedure: 5 /10      Effectiveness:  Initial hour after procedure: 100 %  Subsequent 4-6 hours post-procedure: 50 %  Analgesia past initial 6 hours: 50 %  Ongoing improvement:  Analgesic:  50% Function: Somewhat improved ROM: Somewhat improved   Pharmacotherapy Assessment  Analgesic: Levorphanol 2 mg 3 times daily as needed, MME equals 66  Monitoring: [place] PMP: PDMP reviewed during this encounter.       Pharmacotherapy: No side-effects or adverse reactions reported. Compliance: No problems identified. Effectiveness: Clinically acceptable.  Earlyne Iba, RN  08/04/2022  1:56 PM  Sign when Signing Visit Safety precautions to be maintained throughout the outpatient stay will include: orient to surroundings, keep bed in low position, maintain call bell within reach at all times, provide assistance with transfer out of bed and ambulation.     No results found for: "CBDTHCR" No results found for: "D8THCCBX" No results found for: "D9THCCBX"  UDS:  Summary  Date Value Ref Range Status  06/05/2022 Note  Final    Comment:    ==================================================================== [employer] [facility] [employer]  (MW) ==================================================================== Test                             Result       Flag       Units    NO DRUGS DETECTED. ==================================================================== Test                      Result    Flag   Units      Ref Range   Creatinine              167              mg/dL      >=95 ==================================================================== Declared Medications:  The flagging and interpretation on this report are based on the  following declared medications.  Unexpected results may arise from  inaccuracies in the declared medications.   **Note: The testing scope of this panel does not include the  following reported medications:   Bupropion (Wellbutrin XL)  Celecoxib (Celebrex)  Furosemide  Levorphanol  Levothyroxine  Metoprolol  Omeprazole  Pregabalin (Lyrica)  Quetiapine (Seroquel)  Trazodone ==================================================================== For clinical consultation, please call [phone]. ====================================================================       ROS  Constitutional: Denies any fever or chills Gastrointestinal: No reported hemesis, hematochezia, vomiting, or acute GI distress Musculoskeletal:  Low back, bilateral leg pain Neurological: No reported episodes of acute onset apraxia, aphasia, dysarthria, agnosia, amnesia, paralysis, loss of coordination, or loss of consciousness  Medication Review  Cyanocobalamin, QUEtiapine, buPROPion, celecoxib, furosemide, levorphanol, levothyroxine, metoprolol succinate, omeprazole, pregabalin, and traZODone  History Review  Allergy: Ms. Lamere is allergic to pedi-pre tape spray [wound dressing adhesive]. Drug: Ms. Luff  reports no history of drug use. Alcohol:  reports no history of alcohol use. Tobacco:  reports that she quit smoking about 15 years ago. Her smoking use included cigarettes. She has a 10.00  pack-year smoking history. She has never used smokeless tobacco. Social: Ms. Crompton  reports that she quit smoking about 15 years ago. Her smoking use included cigarettes. She has a 10.00 pack-year smoking history. She has never used smokeless tobacco. She reports that she does not drink alcohol and does not use drugs. Medical:  has a past medical history of Anemia, Anxiety, Arthritis, Cervical spondylosis with myelopathy (2018), Chronic pain (2019), DDD (degenerative disc disease), lumbar, Depression, Displacement of cervical intervertebral disc (2018), Dyspnea, Fibromyalgia, GERD (gastroesophageal reflux disease), Heart murmur, Hypertension, Hypothyroidism, Lumbar post-laminectomy syndrome (2018), Lumbosacral radiculitis (2018), Neuropathy due to medical condition, Osteoarthritis, Osteoporosis, Other long term (current) drug therapy (2019), Pain in the coccyx (2018), Sleep apnea, Spondylolisthesis (2018), Spondylosis of lumbosacral region without myelopathy or radiculopathy (2018), Spondylosis with myelopathy, lumbar region (2018), and Vitamin D deficiency. Surgical: Ms. Guzman  has a past surgical history that includes Cesarean section; Gastric bypass (2004); Lumbar fusion (2016); Spinal cord stimulator implant (2018); Cholecystectomy; Appendectomy; Tonsillectomy; Shoulder arthroscopy with rotator cuff repair (Right, 2008); Joint replacement (Bilateral, 2005); Abdominal hysterectomy (2000); epidural injection (2018); arm surgery (2002); Breast biopsy (Left, 2005); Colonoscopy with propofol (N/A, 08/25/2017); Temporal artery biopsy / ligation; Cataract extraction w/PHACO (Right, 10/21/2017); and Colonoscopy with propofol (N/A, 02/25/2022). Family: family history includes Anxiety disorder in her mother; Breast cancer in her cousin, maternal aunt, and mother; Cancer in her brother, father, and mother; Depression in her mother;  Hypertension in her father; Kidney disease in her mother.  Laboratory Chemistry  Profile   Renal Lab Results  Component Value Date   BUN 18 06/23/2017   CREATININE 0.60 06/23/2017   GFRAA >60 06/23/2017   GFRNONAA >60 06/23/2017    Hepatic Lab Results  Component Value Date   AST 20 06/23/2017   ALT 9 (L) 06/23/2017   ALBUMIN 3.6 06/23/2017   ALKPHOS 88 06/23/2017    Electrolytes Lab Results  Component Value Date   NA 135 06/23/2017   K 4.9 06/23/2017   CL 102 06/23/2017   CALCIUM 8.5 (L) 06/23/2017    Bone No results found for: "VD25OH", "VD125OH2TOT", "ZO1096EA5", "WU9811BJ4", "25OHVITD1", "25OHVITD2", "25OHVITD3", "TESTOFREE", "TESTOSTERONE"  Inflammation (CRP: Acute Phase) (ESR: Chronic Phase) No results found for: "CRP", "ESRSEDRATE", "LATICACIDVEN"       Note: Above Lab results reviewed.   Physical Exam  General appearance: Well nourished, well developed, and well hydrated. In no apparent acute distress Mental status: Alert, oriented x 3 (person, place, & time)       Respiratory: No evidence of acute respiratory distress Eyes: PERLA Vitals: BP (!) 98/56   Pulse 67   Temp (!) 97.1 F (36.2 C) (Temporal)   Resp 18   Ht 5' (1.524 m)   Wt 250 lb (113.4 kg)   BMI 48.82 kg/m  BMI: Estimated body mass index is 48.82 kg/m as calculated from the following:   Height as of this encounter: 5' (1.524 m).   Weight as of this encounter: 250 lb (113.4 kg). Ideal: Ideal body weight: 45.5 kg (100 lb 4.9 oz) Adjusted ideal body weight: 72.7 kg (160 lb 3 oz)  Assessment   Diagnosis Status  1. Lumbar radiculopathy   2. Chronic pain syndrome   3. Spinal stenosis of lumbosacral region    Controlled Controlled Controlled   Updated Problems: No problems updated.  Plan of Care  Problem-specific:  No problem-specific Assessment & Plan notes found for this encounter.  Ms. Leondra Cullin Wolfer has a current medication list which includes the following long-term medication(s): bupropion, levothyroxine, metoprolol succinate, omeprazole, pregabalin,  quetiapine, and trazodone.  Pharmacotherapy (Medications Ordered): Meds ordered this encounter  Medications   levorphanol (LEVODROMORAN) 2 MG tablet    Sig: Take 1 tablet (2 mg total) by mouth every 8 (eight) hours as needed for pain.    Dispense:  90 tablet    Refill:  0   levorphanol (LEVODROMORAN) 2 MG tablet    Sig: Take 1 tablet (2 mg total) by mouth every 8 (eight) hours as needed for pain.    Dispense:  90 tablet    Refill:  0   Repeat caudal as needed Continue Lyrica as prescribed   Orders:  No orders of the defined types were placed in this encounter.  Follow-up plan:   Return in about 3 months (around 11/13/2022) for Medication Management, in person.      Recent Visits Date Type Provider Dept  07/02/22 Procedure visit Edward Jolly, MD [facility]  06/05/22 Office Visit Edward Jolly, MD [facility]  Showing recent visits within past 90 days and meeting all other requirements Today's Visits Date Type Provider Dept  08/04/22 Office Visit Edward Jolly, MD [facility]  Showing today's visits and meeting all other requirements Future Appointments No visits were found meeting these conditions. Showing future appointments within next 90 days and meeting all other requirements  I discussed the assessment and treatment plan with the patient. The  patient was provided an opportunity to ask questions and all were answered. The patient agreed with the plan and demonstrated an understanding of the instructions.  Patient advised to call back or seek an in-person evaluation if the symptoms or condition worsens.  Duration of encounter: 30minutes.  Total time on encounter, as per AMA guidelines included both the face-to-face and non-face-to-face time personally spent by the physician and/or other qualified health care professional(s) on the day of the encounter (includes time in activities that require the physician or other qualified health care  professional and does not include time in activities normally performed by clinical staff). Physician's time may include the following activities when performed: Preparing to see the patient (e.g., pre-charting review of records, searching for previously ordered imaging, lab work, and nerve conduction tests) Review of prior analgesic pharmacotherapies. Reviewing PMP Interpreting ordered tests (e.g., lab work, imaging, nerve conduction tests) Performing post-procedure evaluations, including interpretation of diagnostic procedures Obtaining and/or reviewing separately obtained history Performing a medically appropriate examination and/or evaluation Counseling and educating the patient/family/caregiver Ordering medications, tests, or procedures Referring and communicating with other health care professionals (when not separately reported) Documenting clinical information in the electronic or other health record Independently interpreting results (not separately reported) and communicating results to the patient/ family/caregiver Care coordination (not separately reported)  Note by: Edward JollyBilal Melizza Kanode, MD Date: 08/04/2022; Time: 3:25 PM

## 2022-08-15 ENCOUNTER — Other Ambulatory Visit: Payer: Self-pay | Admitting: Psychiatry

## 2022-08-15 DIAGNOSIS — F23 Brief psychotic disorder: Secondary | ICD-10-CM

## 2022-09-11 ENCOUNTER — Encounter: Payer: Medicare Other | Admitting: Student in an Organized Health Care Education/Training Program

## 2022-09-16 ENCOUNTER — Ambulatory Visit: Payer: Medicare Other | Admitting: Psychiatry

## 2022-09-16 ENCOUNTER — Encounter: Payer: Self-pay | Admitting: Psychiatry

## 2022-09-16 VITALS — BP 122/80 | HR 61 | Temp 97.5°F | Ht 60.0 in | Wt 255.8 lb

## 2022-09-16 DIAGNOSIS — G4701 Insomnia due to medical condition: Secondary | ICD-10-CM

## 2022-09-16 DIAGNOSIS — F23 Brief psychotic disorder: Secondary | ICD-10-CM | POA: Diagnosis not present

## 2022-09-16 DIAGNOSIS — F411 Generalized anxiety disorder: Secondary | ICD-10-CM | POA: Diagnosis not present

## 2022-09-16 DIAGNOSIS — F3342 Major depressive disorder, recurrent, in full remission: Secondary | ICD-10-CM

## 2022-09-16 MED ORDER — TRAZODONE HCL 50 MG PO TABS: 50.0000 mg | ORAL_TABLET | Freq: Every evening | ORAL | 0 refills | Status: DC | PRN

## 2022-09-16 MED ORDER — QUETIAPINE FUMARATE 50 MG PO TABS: 50.0000 mg | ORAL_TABLET | Freq: Every day | ORAL | 0 refills | Status: DC

## 2022-09-16 NOTE — Progress Notes (Signed)
BH MD OP Progress Note  09/16/2022 6:22 PM Connie [name]  MRN:  [id number]  Chief Complaint:  Chief Complaint  Patient presents with   Follow-up   Depression   Medication Refill   HPI: Connie [name] is a 67 year old Caucasian female, married, lives in [place], has a history of MDD, GAD, insomnia, obstructive sleep apnea on CPAP, fibromyalgia, hypothyroidism, polyarthralgia was evaluated in office today.  Patient today reports she is currently doing much better with regards to her mood.  She reports she has a daughter who is supportive.  Her daughter recently moved to [place] and she is closer to her now.  She also has been spending time with her grandchildren who are 69 and 78 years old.  She reports her mother-in-law passed away in 09-17-22 however she has been coping okay.  Patient denies any significant sadness, hopelessness or anxiety symptoms.  Denies any psychosis.  Patient does report sleep is interrupted.  She reports she goes to bed at around 9:30 PM and wakes up at around 5:30 or 6 AM.  She however reports sleep gets restless throughout the night.  She used to sleep better on the trazodone.  She currently has Seroquel 100 mg which was added for psychosis.  Agreeable to tapering off Seroquel since she currently does not have any psychosis.  Patient denies any suicidality or homicidality.  Patient continues to struggle with degenerative arthritis, of her hip, shoulder, back as well as struggles with her weight.  She is interested in weight loss.  Agreeable to referral to weight management clinic.  Patient denies any other concerns today.  Visit Diagnosis:    ICD-10-CM   1. MDD (major depressive disorder), recurrent, in full remission (HCC)  F33.42     2. GAD (generalized anxiety disorder)  F41.1     3. Brief psychotic disorder (HCC)  F23 QUEtiapine (SEROQUEL) 50 MG tablet    4. Insomnia due to medical condition  G47.01 traZODone (DESYREL) 50 MG tablet   Pain, RLS     5. Severe obesity (BMI >= 40) (HCC)  E66.01 Amb Ref to Medical Weight Management      Past Psychiatric History: I have reviewed past psychiatric history from progress note on 08/06/2017.  Past trials of Latuda, Wellbutrin, Cymbalta.  Past Medical History:  Past Medical History:  Diagnosis Date   Anemia    in the past   Anxiety    Arthritis    all over   Cervical spondylosis with myelopathy 2018   Chronic pain 2019   can't stand straight, uses walker for stability   DDD (degenerative disc disease), lumbar    Depression    Displacement of cervical intervertebral disc 2018   Dyspnea    HANDICAPPED   Fibromyalgia    Polymyalgia Rheumatica   GERD (gastroesophageal reflux disease)    Heart murmur    not treated or being followed   Hypertension    CONTROLLED   Hypothyroidism    Lumbar post-laminectomy syndrome 2018   Lumbosacral radiculitis 2018   Neuropathy due to medical condition (HCC)    feet, toes, fingers   Osteoarthritis    Osteoporosis    Other long term (current) drug therapy 2019   from pain management   Pain in the coccyx 2018   disorder of sacrum   Sleep apnea    Spondylolisthesis 2018   Spondylosis of lumbosacral region without myelopathy or radiculopathy 2018   Spondylosis with myelopathy, lumbar region 2018   Vitamin D deficiency  Past Surgical History:  Procedure Laterality Date   ABDOMINAL HYSTERECTOMY  2000   APPENDECTOMY     arm surgery  2002   BREAST BIOPSY Left 2005   benign   CATARACT EXTRACTION W/PHACO Right 10/21/2017   Procedure: CATARACT EXTRACTION PHACO AND INTRAOCULAR LENS PLACEMENT (IOC) RIGHT;  Surgeon: Lockie Mola, MD;  Location: [facility];  Service: Ophthalmology;  Laterality: Right;  neck issues   CESAREAN SECTION     x 2   CHOLECYSTECTOMY     COLONOSCOPY WITH PROPOFOL N/A 08/25/2017   Procedure: COLONOSCOPY WITH PROPOFOL;  Surgeon: Toledo, Boykin Nearing, MD;  Location: [facility];  Service: Gastroenterology;   Laterality: N/A;   COLONOSCOPY WITH PROPOFOL N/A 02/25/2022   Procedure: COLONOSCOPY WITH PROPOFOL;  Surgeon: Regis Bill, MD;  Location: [facility];  Service: Endoscopy;  Laterality: N/A;   epidural injection  2018   steroids   GASTRIC BYPASS  2004   JOINT REPLACEMENT Bilateral 2005   knees   LUMBAR FUSION  2016   x 2/ L3-5   SHOULDER ARTHROSCOPY WITH ROTATOR CUFF REPAIR Right 2008   x 3   SPINAL CORD STIMULATOR IMPLANT  2018   [employer]   TEMPORAL ARTERY BIOPSY / LIGATION     TONSILLECTOMY      Family Psychiatric History: I have reviewed family psychiatric history from progress note on 08/06/2017.  Family History:  Family History  Problem Relation Age of Onset   Kidney disease Mother    Cancer Mother    Anxiety disorder Mother    Depression Mother    Breast cancer Mother    Cancer Father    Hypertension Father    Cancer Brother    Breast cancer Maternal Aunt    Breast cancer Cousin     Social History: I have reviewed social history from progress note on 08/06/2017. Social History   Socioeconomic History   Marital status: Married    Spouse name: brady    Number of children: 2   Years of education: Not on file   Highest education level: High school graduate  Occupational History    Comment: disabled   Tobacco Use   Smoking status: Former    Packs/day: 0.50    Years: 20.00    Additional pack years: 0.00    Total pack years: 10.00    Types: Cigarettes    Quit date: 05/21/2007    Years since quitting: 15.3   Smokeless tobacco: Never  Vaping Use   Vaping Use: Never used  Substance and Sexual Activity   Alcohol use: No   Drug use: No    Comment: managed by pain clinic   Sexual activity: Not Currently  Other Topics Concern   Not on file  Social History Narrative   Not on file   Social Determinants of Health   Financial Resource Strain: Medium Risk (05/20/2017)   Overall Financial Resource Strain (CARDIA)    Difficulty of Paying Living  Expenses: Somewhat hard  Food Insecurity: No Food Insecurity (05/20/2017)   Hunger Vital Sign    Worried About Running Out of Food in the Last Year: Never true    Ran Out of Food in the Last Year: Never true  Transportation Needs: No Transportation Needs (05/20/2017)   PRAPARE - Administrator, Civil Service (Medical): No    Lack of Transportation (Non-Medical): No  Physical Activity: Inactive (05/20/2017)   Exercise Vital Sign    Days of Exercise per Week: 0 days  Minutes of Exercise per Session: 0 min  Stress: Not on file  Social Connections: Moderately Integrated (05/20/2017)   Social Connection and Isolation Panel [NHANES]    Frequency of Communication with Friends and Family: Three times a week    Frequency of Social Gatherings with Friends and Family: Once a week    Attends Religious Services: More than 4 times per year    Active Member of [employer] or Organizations: No    Attends Banker Meetings: Never    Marital Status: Married    Allergies:  Allergies  Allergen Reactions   Pedi-Pre Tape Spray [Wound Dressing Adhesive] Rash    Metabolic Disorder Labs: No results found for: "HGBA1C", "MPG" No results found for: "PROLACTIN" No results found for: "CHOL", "TRIG", "HDL", "CHOLHDL", "VLDL", "LDLCALC" No results found for: "TSH"  Therapeutic Level Labs: No results found for: "LITHIUM" No results found for: "VALPROATE" No results found for: "CBMZ"  Current Medications: Current Outpatient Medications  Medication Sig Dispense Refill   buPROPion (WELLBUTRIN XL) 150 MG 24 hr tablet Take 1 tablet (150 mg total) by mouth daily. 90 tablet 1   celecoxib (CELEBREX) 100 MG capsule Take by mouth.     Cyanocobalamin (VITAMIN B-12 IJ) Inject as directed. Once a month     furosemide (LASIX) 40 MG tablet Take by mouth.     levorphanol (LEVODROMORAN) 2 MG tablet Take 1 tablet (2 mg total) by mouth every 8 (eight) hours as needed for pain. 90 tablet 0   [START ON  10/15/2022] levorphanol (LEVODROMORAN) 2 MG tablet Take 1 tablet (2 mg total) by mouth every 8 (eight) hours as needed for pain. 90 tablet 0   levothyroxine (SYNTHROID) 50 MCG tablet      metoprolol succinate (TOPROL-XL) 100 MG 24 hr tablet Take 100 mg by mouth once daily in the evening  0   omeprazole (PRILOSEC) 20 MG capsule Take 20 mg by mouth daily.     pregabalin (LYRICA) 150 MG capsule Take 150 mg by mouth 3 (three) times daily.     QUEtiapine (SEROQUEL) 50 MG tablet Take 1 tablet (50 mg total) by mouth at bedtime. 90 tablet 0   traZODone (DESYREL) 50 MG tablet Take 1 tablet (50 mg total) by mouth at bedtime as needed for sleep. 90 tablet 0   No current facility-administered medications for this visit.     Musculoskeletal: Strength & Muscle Tone: within normal limits Gait & Station:  walks with walker Patient leans: Front  Psychiatric Specialty Exam: Review of Systems  Psychiatric/Behavioral:  Positive for sleep disturbance.     Blood pressure 122/80, pulse 61, temperature (!) 97.5 F (36.4 C), temperature source Skin, height 5' (1.524 m), weight 255 lb 12.8 oz (116 kg).Body mass index is 49.96 kg/m.  General Appearance: Casual  Eye Contact:  Fair  Speech:  Normal Rate  Volume:  Normal  Mood:  Euthymic  Affect:  Full Range  Thought Process:  Goal Directed and Descriptions of Associations: Intact  Orientation:  Full (Time, Place, and Person)  Thought Content: Logical   Suicidal Thoughts:  No  Homicidal Thoughts:  No  Memory:  Immediate;   Fair Recent;   Fair Remote;   Fair  Judgement:  Fair  Insight:  Fair  Psychomotor Activity:  Normal  Concentration:  Concentration: Fair and Attention Span: Fair  Recall:  [employer] of Knowledge: Fair  Language: Fair  Akathisia:  No  Handed:  Right  AIMS (if indicated): done  Assets:  Communication Skills Desire for Improvement Housing Talents/Skills  ADL's:  Intact  Cognition: WNL  Sleep:  Poor   Screenings: Probation officer Office Visit from 09/16/2022 in [facility] Office Visit from 06/17/2022 in [facility] Office Visit from 04/16/2022 in [facility] Office Visit from 02/12/2022 in [facility] Office Visit from 01/16/2022 in [facility]  AIMS Total Score 0 0 0 0 0      GAD-7    Flowsheet Row Office Visit from 09/16/2022 in [facility] Office Visit from 06/17/2022 in [facility] Office Visit from 04/16/2022 in [facility] Office Visit from 02/12/2022 in [facility] Office Visit from 01/16/2022 in [facility]  Total GAD-7 Score 0 0 2 3 4       PHQ2-9    Flowsheet Row Office Visit from 09/16/2022 in [facility] Office Visit from 08/04/2022 in [facility] Visit from 06/17/2022 in [facility] Office Visit from 06/05/2022 in [facility] Visit from 04/16/2022 in [facility]  PHQ-2 Total Score 0 0 0 0 0  PHQ-9 Total Score 3 -- 2 -- 4      Flowsheet Row Office Visit from 09/16/2022 in [facility] Office Visit from 06/17/2022 in [facility] Office Visit from 04/16/2022 in [facility]  C-SSRS RISK CATEGORY No Risk No Risk No Risk        Assessment and Plan: Kierre Seeney is a 67 year old Caucasian female with history of MDD,  GAD, chronic pain, hypothyroidism was evaluated in office today.  Patient is currently stable with regards to her mood although she does have sleep problems.  Patient also with morbid obesity, likely multifactorial currently on multiple psychotropics as well as multiple comorbidity.  Plan as noted below.  Plan MDD in remission Wellbutrin XL 150 mg p.o. daily-reduced dosage Will reduce Seroquel to 50 mg p.o. nightly Long-term plan is to taper off.  Brief psychotic disorder-resolved Will monitor closely  Insomnia-unstable Reduce Seroquel to 50 mg p.o. nightly. Start trazodone 50 mg p.o. nightly as needed Pending sleep study.  Morbid obesity-will refer to weight management clinic.  Follow-up in clinic in 6 to 8 weeks or sooner if needed.  Collaboration of Care: Collaboration of Care: Other I have referred patient to weight management clinic.  Patient/Guardian was advised Release of Information must be obtained prior to any record release in order to collaborate their care with an outside provider. Patient/Guardian was advised if they have not already done so to contact the registration department to sign all necessary forms in order for [place] to release information regarding their care.   Consent: Patient/Guardian gives verbal consent for treatment and assignment of benefits for services provided during this visit. Patient/Guardian expressed understanding and agreed to proceed.   This note was generated in part or whole with voice recognition software. Voice recognition is usually quite accurate but there are transcription errors that can and very often do occur. I apologize for any typographical errors that were not detected and corrected.    Jomarie Longs, MD 09/16/2022, 6:22  PM

## 2022-11-11 ENCOUNTER — Encounter: Payer: Self-pay | Admitting: Student in an Organized Health Care Education/Training Program

## 2022-11-11 ENCOUNTER — Ambulatory Visit
Payer: Medicare Other | Attending: Student in an Organized Health Care Education/Training Program | Admitting: Student in an Organized Health Care Education/Training Program

## 2022-11-11 VITALS — BP 122/53 | HR 77 | Temp 97.2°F | Resp 16 | Ht 60.0 in | Wt 234.0 lb

## 2022-11-11 DIAGNOSIS — Z9689 Presence of other specified functional implants: Secondary | ICD-10-CM | POA: Insufficient documentation

## 2022-11-11 DIAGNOSIS — M4807 Spinal stenosis, lumbosacral region: Secondary | ICD-10-CM | POA: Insufficient documentation

## 2022-11-11 DIAGNOSIS — M5416 Radiculopathy, lumbar region: Secondary | ICD-10-CM | POA: Diagnosis not present

## 2022-11-11 DIAGNOSIS — G894 Chronic pain syndrome: Secondary | ICD-10-CM | POA: Insufficient documentation

## 2022-11-11 DIAGNOSIS — Z981 Arthrodesis status: Secondary | ICD-10-CM | POA: Insufficient documentation

## 2022-11-11 DIAGNOSIS — M5136 Other intervertebral disc degeneration, lumbar region: Secondary | ICD-10-CM | POA: Insufficient documentation

## 2022-11-11 MED ORDER — LEVORPHANOL TARTRATE 2 MG PO TABS
2.0000 mg | ORAL_TABLET | Freq: Three times a day (TID) | ORAL | 0 refills | Status: AC | PRN
Start: 2022-11-14 — End: 2022-12-14

## 2022-11-11 MED ORDER — LEVORPHANOL TARTRATE 2 MG PO TABS
2.0000 mg | ORAL_TABLET | Freq: Three times a day (TID) | ORAL | 0 refills | Status: AC | PRN
Start: 2022-12-14 — End: 2023-01-13

## 2022-11-11 MED ORDER — LEVORPHANOL TARTRATE 2 MG PO TABS
2.0000 mg | ORAL_TABLET | Freq: Three times a day (TID) | ORAL | 0 refills | Status: DC | PRN
Start: 2023-01-13 — End: 2023-02-10

## 2022-11-11 NOTE — Progress Notes (Signed)
PROVIDER NOTE: Information contained herein reflects review and annotations entered in association with encounter. Interpretation of such information and data should be left to medically-trained personnel. Information provided to patient can be located elsewhere in the medical record under "Patient Instructions". Document created using STT-dictation technology, any transcriptional errors that may result from process are unintentional.    Patient: Connie [name]  Service Category: E/M  Provider: Edward Jolly, MD  DOB: 1956-01-12  DOS: 11/11/2022  Referring Provider: Wilford Corner  MRN: [id number]  Specialty: Interventional Pain Management  PCP: Wilford Corner, PA-C  Type: Established Patient  Setting: Ambulatory outpatient    Location: Office  Delivery: Face-to-face     HPI  [name], a 67 y.o. year old female, is here today because of her Lumbar radiculopathy [M54.16]. [name] primary complain today is Back Pain (low)  Pertinent problems: [name] has Degenerative joint disease of cervical and lumbar spine; Lumbar spondylosis; History of lumbar fusion; Lumbar degenerative disc disease; Fibromyalgia; Chronic pain syndrome; Spinal cord stimulator status; Acquired spondylolisthesis; Chronic pain; Generalized osteoarthritis of multiple sites; Spinal stenosis of lumbar region without neurogenic claudication; and Lumbar post-laminectomy syndrome on their pertinent problem list. Pain Assessment: Severity of Chronic pain is reported as a 1 /10. Location: Back Lower/radiates into left leg to foot. Onset: More than a month ago. Quality: Cramping. Timing: Intermittent. Modifying factor(s): medicine. Vitals:  height is 5' (1.524 m) and weight is 234 lb (106.1 kg). Her temperature is 97.2 F (36.2 C) (abnormal). Her blood pressure is 122/53 (abnormal) and her pulse is 77. Her respiration is 16 and oxygen saturation is 94%.  BMI: Estimated body mass index is 45.7 kg/m as  calculated from the following:   Height as of this encounter: 5' (1.524 m).   Weight as of this encounter: 234 lb (106.1 kg). Last encounter: 06/05/2022. Last procedure: 07/02/2022.  Reason for encounter: medication management.   No change in medical history since last visit.  Patient's pain is at baseline.  Patient continues multimodal pain regimen as prescribed.  States that it provides pain relief and improvement in functional status.    Analgesic:   10/15/2022 08/04/2022  1 Levorphanol 2 Mg Tablet 90.00 30 Bi Lat [id number] Nor (1409) 0/0 66.00 MME Medicare [place]         Pharmacotherapy Assessment  Analgesic: Levorphanol 2 mg 3 times daily as needed, MME equals 66  Monitoring: [place] PMP: PDMP reviewed during this encounter.       Pharmacotherapy: No side-effects or adverse reactions reported. Compliance: No problems identified. Effectiveness: Clinically acceptable.  Valerie Salts, RN  11/11/2022  1:09 PM  Sign when Signing Visit Nursing Pain Medication Assessment:  Safety precautions to be maintained throughout the outpatient stay will include: orient to surroundings, keep bed in low position, maintain call bell within reach at all times, provide assistance with transfer out of bed and ambulation.  Medication Inspection Compliance: [name] did not comply with our request to bring her pills to be counted. She was reminded that bringing the medication bottles, even when empty, is a requirement.  Medication: None brought in. Pill/Patch Count: None available to be counted. Bottle Appearance: No container available. Did not bring bottle(s) to appointment. Filled Date: N/A Last Medication intake:  Today    No results found for: "CBDTHCR" No results found for: "D8THCCBX" No results found for: "D9THCCBX"  UDS:  Summary  Date Value Ref Range Status  06/05/2022 Note  Final    Comment:    ====================================================================  [employer] Select 13  (MW) ==================================================================== Test                             Result       Flag       Units    NO DRUGS DETECTED. ==================================================================== Test                      Result    Flag   Units      Ref Range   Creatinine              167              mg/dL      >=16 ==================================================================== Declared Medications:  The flagging and interpretation on this report are based on the  following declared medications.  Unexpected results may arise from  inaccuracies in the declared medications.   **Note: The testing scope of this panel does not include the  following reported medications:   Bupropion (Wellbutrin XL)  Celecoxib (Celebrex)  Furosemide  Levorphanol  Levothyroxine  Metoprolol  Omeprazole  Pregabalin (Lyrica)  Quetiapine (Seroquel)  Trazodone ==================================================================== For clinical consultation, please call [phone]. ====================================================================       ROS  Constitutional: Denies any fever or chills Gastrointestinal: No reported hemesis, hematochezia, vomiting, or acute GI distress Musculoskeletal:  Low back, bilateral leg pain Neurological: No reported episodes of acute onset apraxia, aphasia, dysarthria, agnosia, amnesia, paralysis, loss of coordination, or loss of consciousness  Medication Review  Cyanocobalamin, QUEtiapine, buPROPion, celecoxib, furosemide, levorphanol, levothyroxine, metoprolol succinate, omeprazole, pregabalin, and traZODone  History Review  Allergy: [name] is allergic to pedi-pre tape spray [wound dressing adhesive]. Drug: [name]  reports no history of drug use. Alcohol:  reports no history of alcohol use. Tobacco:  reports that she quit smoking about 15 years ago. Her smoking use included cigarettes. She started smoking  about 35 years ago. She has a 10 pack-year smoking history. She has never used smokeless tobacco. Social: [name]  reports that she quit smoking about 15 years ago. Her smoking use included cigarettes. She started smoking about 35 years ago. She has a 10 pack-year smoking history. She has never used smokeless tobacco. She reports that she does not drink alcohol and does not use drugs. Medical:  has a past medical history of Anemia, Anxiety, Arthritis, Cervical spondylosis with myelopathy (2018), Chronic pain (2019), DDD (degenerative disc disease), lumbar, Depression, Displacement of cervical intervertebral disc (2018), Dyspnea, Fibromyalgia, GERD (gastroesophageal reflux disease), Heart murmur, Hypertension, Hypothyroidism, Lumbar post-laminectomy syndrome (2018), Lumbosacral radiculitis (2018), Neuropathy due to medical condition (HCC), Osteoarthritis, Osteoporosis, Other long term (current) drug therapy (2019), Pain in the coccyx (2018), Sleep apnea, Spondylolisthesis (2018), Spondylosis of lumbosacral region without myelopathy or radiculopathy (2018), Spondylosis with myelopathy, lumbar region (2018), and Vitamin D deficiency. Surgical: Ms. Carmen  has a past surgical history that includes Cesarean section; Gastric bypass (2004); Lumbar fusion (2016); Spinal cord stimulator implant (2018); Cholecystectomy; Appendectomy; Tonsillectomy; Shoulder arthroscopy with rotator cuff repair (Right, 2008); Joint replacement (Bilateral, 2005); Abdominal hysterectomy (2000); epidural injection (2018); arm surgery (2002); Breast biopsy (Left, 2005); Colonoscopy with propofol (N/A, 08/25/2017); Temporal artery biopsy / ligation; Cataract extraction w/PHACO (Right, 10/21/2017); and Colonoscopy with propofol (N/A, 02/25/2022). Family: family history includes Anxiety disorder in her mother; Breast cancer in her cousin, maternal aunt, and mother; Cancer in her brother, father, and mother; Depression in her mother; Hypertension  in  her father; Kidney disease in her mother.  Laboratory Chemistry Profile   Renal Lab Results  Component Value Date   BUN 18 06/23/2017   CREATININE 0.60 06/23/2017   GFRAA >60 06/23/2017   GFRNONAA >60 06/23/2017    Hepatic Lab Results  Component Value Date   AST 20 06/23/2017   ALT 9 (L) 06/23/2017   ALBUMIN 3.6 06/23/2017   ALKPHOS 88 06/23/2017    Electrolytes Lab Results  Component Value Date   NA 135 06/23/2017   K 4.9 06/23/2017   CL 102 06/23/2017   CALCIUM 8.5 (L) 06/23/2017    Bone No results found for: "VD25OH", "VD125OH2TOT", "RJ1884ZY6", "AY3016WF0", "25OHVITD1", "25OHVITD2", "25OHVITD3", "TESTOFREE", "TESTOSTERONE"  Inflammation (CRP: Acute Phase) (ESR: Chronic Phase) No results found for: "CRP", "ESRSEDRATE", "LATICACIDVEN"       Note: Above Lab results reviewed.   Physical Exam  General appearance: Well nourished, well developed, and well hydrated. In no apparent acute distress Mental status: Alert, oriented x 3 (person, place, & time)       Respiratory: No evidence of acute respiratory distress Eyes: PERLA Vitals: BP (!) 122/53   Pulse 77   Temp (!) 97.2 F (36.2 C)   Resp 16   Ht 5' (1.524 m)   Wt 234 lb (106.1 kg)   SpO2 94%   BMI 45.70 kg/m  BMI: Estimated body mass index is 45.7 kg/m as calculated from the following:   Height as of this encounter: 5' (1.524 m).   Weight as of this encounter: 234 lb (106.1 kg). Ideal: Ideal body weight: 45.5 kg (100 lb 4.9 oz) Adjusted ideal body weight: 69.8 kg (153 lb 12.6 oz)  Patient ambulates with walker  Assessment   Diagnosis Status  1. Lumbar radiculopathy   2. Spinal cord stimulator status   3. History of lumbar fusion   4. Lumbar degenerative disc disease   5. Spinal stenosis of lumbosacral region   6. Chronic pain syndrome     Controlled Controlled Controlled   Updated Problems: No problems updated.  Plan of Care    Ms. Taralee Marcus Stumpo has a current medication list which  includes the following long-term medication(s): bupropion, levothyroxine, metoprolol succinate, omeprazole, pregabalin, quetiapine, and trazodone.  Presents today for case management.  No significant change in her medical history since she last saw me.  Continues her levorphanol as prescribed.  Falls since her last visit.  She has a Administrator, [employer] in place which was implanted in [place].  She states that this is no longer functional lack of ability to charge.  She also did not find it very helpful while it was working.  She wanted me that MRIs have been ordered for her in the past but she is unable to get them given her spinal cord stimulator system.  With her consent, I contacted our [facility] [employer] spinal cord stim representative to see if they could optimize programming and get clarification on its MRI compatibility.  Pharmacotherapy (Medications Ordered): Meds ordered this encounter  Medications   levorphanol (LEVODROMORAN) 2 MG tablet    Sig: Take 1 tablet (2 mg total) by mouth every 8 (eight) hours as needed for pain.    Dispense:  90 tablet    Refill:  0   levorphanol (LEVODROMORAN) 2 MG tablet    Sig: Take 1 tablet (2 mg total) by mouth every 8 (eight) hours as needed for pain.    Dispense:  90 tablet    Refill:  0  levorphanol (LEVODROMORAN) 2 MG tablet    Sig: Take 1 tablet (2 mg total) by mouth every 8 (eight) hours as needed for pain.    Dispense:  90 tablet    Refill:  0   Repeat caudal as needed Continue Lyrica as prescribed   Orders:  No orders of the defined types were placed in this encounter.  Follow-up plan:   Return in about 3 months (around 02/11/2023) for Medication Management, in person.      Recent Visits No visits were found meeting these conditions. Showing recent visits within past 90 days and meeting all other requirements Today's Visits Date Type Provider Dept  11/11/22 Office Visit Edward Jolly, MD [facility]   Showing today's visits and meeting all other requirements Future Appointments No visits were found meeting these conditions. Showing future appointments within next 90 days and meeting all other requirements  I discussed the assessment and treatment plan with the patient. The patient was provided an opportunity to ask questions and all were answered. The patient agreed with the plan and demonstrated an understanding of the instructions.  Patient advised to call back or seek an in-person evaluation if the symptoms or condition worsens.  Duration of encounter: .  Total time on encounter, as per AMA guidelines included both the face-to-face and non-face-to-face time personally spent by the physician and/or other qualified health care professional(s) on the day of the encounter (includes time in activities that require the physician or other qualified health care professional and does not include time in activities normally performed by clinical staff). Physician's time may include the following activities when performed: Preparing to see the patient (e.g., pre-charting review of records, searching for previously ordered imaging, lab work, and nerve conduction tests) Review of prior analgesic pharmacotherapies. Reviewing PMP Interpreting ordered tests (e.g., lab work, imaging, nerve conduction tests) Performing post-procedure evaluations, including interpretation of diagnostic procedures Obtaining and/or reviewing separately obtained history Performing a medically appropriate examination and/or evaluation Counseling and educating the patient/family/caregiver Ordering medications, tests, or procedures Referring and communicating with other health care professionals (when not separately reported) Documenting clinical information in the electronic or other health record Independently interpreting results (not separately reported) and communicating results to the patient/  family/caregiver Care coordination (not separately reported)  Note by: Edward Jolly, MD Date: 11/11/2022; Time: 1:34 PM

## 2022-11-11 NOTE — Progress Notes (Signed)
Nursing Pain Medication Assessment:  Safety precautions to be maintained throughout the outpatient stay will include: orient to surroundings, keep bed in low position, maintain call bell within reach at all times, provide assistance with transfer out of bed and ambulation.  Medication Inspection Compliance: Connie [name] did not comply with our request to bring her pills to be counted. She was reminded that bringing the medication bottles, even when empty, is a requirement.  Medication: None brought in. Pill/Patch Count: None available to be counted. Bottle Appearance: No container available. Did not bring bottle(s) to appointment. Filled Date: N/A Last Medication intake:  Today

## 2022-11-18 ENCOUNTER — Encounter: Payer: Self-pay | Admitting: Psychiatry

## 2022-11-18 ENCOUNTER — Ambulatory Visit: Payer: Medicare Other | Admitting: Psychiatry

## 2022-11-18 VITALS — BP 128/82 | HR 65 | Temp 97.4°F | Ht 60.0 in | Wt 242.2 lb

## 2022-11-18 DIAGNOSIS — F3342 Major depressive disorder, recurrent, in full remission: Secondary | ICD-10-CM | POA: Diagnosis not present

## 2022-11-18 DIAGNOSIS — F23 Brief psychotic disorder: Secondary | ICD-10-CM

## 2022-11-18 DIAGNOSIS — G4701 Insomnia due to medical condition: Secondary | ICD-10-CM

## 2022-11-18 DIAGNOSIS — F411 Generalized anxiety disorder: Secondary | ICD-10-CM

## 2022-11-18 MED ORDER — QUETIAPINE FUMARATE 50 MG PO TABS
50.0000 mg | ORAL_TABLET | Freq: Every evening | ORAL | 0 refills | Status: DC | PRN
Start: 2022-11-18 — End: 2023-05-08

## 2022-11-18 MED ORDER — TRAZODONE HCL 50 MG PO TABS
50.0000 mg | ORAL_TABLET | Freq: Every evening | ORAL | 0 refills | Status: DC | PRN
Start: 2022-11-18 — End: 2023-02-16

## 2022-11-18 NOTE — Progress Notes (Unsigned)
BH MD OP Progress Note  11/18/2022 3:52 PM Connie [name]  MRN:  [id number]  Chief Complaint:  Chief Complaint  Patient presents with   Follow-up   Depression   Anxiety   Medication Refill   HPI: Connie [name] is a 67 year old Caucasian female, married, lives in [place], has a history of MDD, GAD, insomnia, obstructive sleep apnea currently not on CPAP, fibromyalgia, hypothyroidism, polyarthralgia was evaluated in office today.  Patient today reports she continues to do well.  She reports she continues to spend a lot of time with her daughter and grandchildren.  She reports her mood symptoms are currently manageable.  Denies any significant depression or anxiety.  Patient reports sleep is overall good.  She currently is not using CPAP.  Patient reports she did not tolerate her CPAP mask and she was unable to get a new one.  She denies any significant hypersomnia and fatigue during the day.  Denies any psychosis.  Currently compliant on medications like Wellbutrin, Seroquel, trazodone.  Denies any suicidality or homicidality.  Patient was unable to keep her appointment for weight loss management.  Reports it was too far for her to go to [place].  She does not drive.  Patient however interested in being tapered off of the Seroquel since it does carry a side effect of weight gain.  Patient denies any other concerns today.  Visit Diagnosis:    ICD-10-CM   1. MDD (major depressive disorder), recurrent, in full remission (HCC)  F33.42     2. GAD (generalized anxiety disorder)  F41.1     3. Brief psychotic disorder (HCC)  F23 QUEtiapine (SEROQUEL) 50 MG tablet    4. Insomnia due to medical condition  G47.01 traZODone (DESYREL) 50 MG tablet   Pain, RLS      Past Psychiatric History: I have reviewed past psychiatric history from progress note on 08/06/2017.  Past trials of Latuda, Wellbutrin, Cymbalta.  Past Medical History:  Past Medical History:  Diagnosis Date   Anemia     in the past   Anxiety    Arthritis    all over   Cervical spondylosis with myelopathy 2018   Chronic pain 2019   can't stand straight, uses walker for stability   DDD (degenerative disc disease), lumbar    Depression    Displacement of cervical intervertebral disc 2018   Dyspnea    HANDICAPPED   Fibromyalgia    Polymyalgia Rheumatica   GERD (gastroesophageal reflux disease)    Heart murmur    not treated or being followed   Hypertension    CONTROLLED   Hypothyroidism    Lumbar post-laminectomy syndrome 2018   Lumbosacral radiculitis 2018   Neuropathy due to medical condition (HCC)    feet, toes, fingers   Osteoarthritis    Osteoporosis    Other long term (current) drug therapy 2019   from pain management   Pain in the coccyx 2018   disorder of sacrum   Sleep apnea    Spondylolisthesis 2018   Spondylosis of lumbosacral region without myelopathy or radiculopathy 2018   Spondylosis with myelopathy, lumbar region 2018   Vitamin D deficiency     Past Surgical History:  Procedure Laterality Date   ABDOMINAL HYSTERECTOMY  2000   APPENDECTOMY     arm surgery  2002   BREAST BIOPSY Left 2005   benign   CATARACT EXTRACTION W/PHACO Right 10/21/2017   Procedure: CATARACT EXTRACTION PHACO AND INTRAOCULAR LENS PLACEMENT (IOC) RIGHT;  Surgeon:  Lockie Mola, MD;  Location: [facility];  Service: Ophthalmology;  Laterality: Right;  neck issues   CESAREAN SECTION     x 2   CHOLECYSTECTOMY     COLONOSCOPY WITH PROPOFOL N/A 08/25/2017   Procedure: COLONOSCOPY WITH PROPOFOL;  Surgeon: Toledo, Boykin Nearing, MD;  Location: [facility];  Service: Gastroenterology;  Laterality: N/A;   COLONOSCOPY WITH PROPOFOL N/A 02/25/2022   Procedure: COLONOSCOPY WITH PROPOFOL;  Surgeon: Regis Bill, MD;  Location: [facility];  Service: Endoscopy;  Laterality: N/A;   epidural injection  2018   steroids   GASTRIC BYPASS  2004   JOINT REPLACEMENT Bilateral 2005   knees    LUMBAR FUSION  2016   x 2/ L3-5   SHOULDER ARTHROSCOPY WITH ROTATOR CUFF REPAIR Right 2008   x 3   SPINAL CORD STIMULATOR IMPLANT  2018   [employer]   TEMPORAL ARTERY BIOPSY / LIGATION     TONSILLECTOMY      Family Psychiatric History: I have reviewed family psychiatric history from progress note on 08/06/2017.  Family History:  Family History  Problem Relation Age of Onset   Kidney disease Mother    Cancer Mother    Anxiety disorder Mother    Depression Mother    Breast cancer Mother    Cancer Father    Hypertension Father    Cancer Brother    Breast cancer Maternal Aunt    Breast cancer Cousin     Social History: I have reviewed social history from progress note on 08/06/2017. Social History   Socioeconomic History   Marital status: Married    Spouse name: brady    Number of children: 2   Years of education: Not on file   Highest education level: High school graduate  Occupational History    Comment: disabled   Tobacco Use   Smoking status: Former    Current packs/day: 0.00    Average packs/day: 0.5 packs/day for 20.0 years (10.0 ttl pk-yrs)    Types: Cigarettes    Start date: 05/21/1987    Quit date: 05/21/2007    Years since quitting: 15.5   Smokeless tobacco: Never  Vaping Use   Vaping status: Never Used  Substance and Sexual Activity   Alcohol use: No   Drug use: No    Comment: managed by pain clinic   Sexual activity: Not Currently  Other Topics Concern   Not on file  Social History Narrative   Not on file   Social Determinants of Health   Financial Resource Strain: Medium Risk (05/20/2017)   Overall Financial Resource Strain (CARDIA)    Difficulty of Paying Living Expenses: Somewhat hard  Food Insecurity: No Food Insecurity (05/20/2017)   Hunger Vital Sign    Worried About Running Out of Food in the Last Year: Never true    Ran Out of Food in the Last Year: Never true  Transportation Needs: No Transportation Needs (05/20/2017)   PRAPARE -  Administrator, Civil Service (Medical): No    Lack of Transportation (Non-Medical): No  Physical Activity: Inactive (05/20/2017)   Exercise Vital Sign    Days of Exercise per Week: 0 days    Minutes of Exercise per Session: 0 min  Stress: Not on file  Social Connections: Moderately Integrated (05/20/2017)   Social Connection and Isolation Panel [NHANES]    Frequency of Communication with Friends and Family: Three times a week    Frequency of Social Gatherings with Friends and  Family: Once a week    Attends Religious Services: More than 4 times per year    Active Member of Clubs or Organizations: No    Attends Banker Meetings: Never    Marital Status: Married    Allergies:  Allergies  Allergen Reactions   Pedi-Pre Tape Spray [Wound Dressing Adhesive] Rash    Metabolic Disorder Labs: No results found for: "HGBA1C", "MPG" No results found for: "PROLACTIN" No results found for: "CHOL", "TRIG", "HDL", "CHOLHDL", "VLDL", "LDLCALC" No results found for: "TSH"  Therapeutic Level Labs: No results found for: "LITHIUM" No results found for: "VALPROATE" No results found for: "CBMZ"  Current Medications: Current Outpatient Medications  Medication Sig Dispense Refill   buPROPion (WELLBUTRIN XL) 150 MG 24 hr tablet Take 1 tablet (150 mg total) by mouth daily. 90 tablet 1   celecoxib (CELEBREX) 100 MG capsule Take by mouth.     Cyanocobalamin (VITAMIN B-12 IJ) Inject as directed. Once a month     furosemide (LASIX) 40 MG tablet Take by mouth.     levorphanol (LEVODROMORAN) 2 MG tablet Take 1 tablet (2 mg total) by mouth every 8 (eight) hours as needed for pain. 90 tablet 0   [START ON 12/14/2022] levorphanol (LEVODROMORAN) 2 MG tablet Take 1 tablet (2 mg total) by mouth every 8 (eight) hours as needed for pain. 90 tablet 0   [START ON 01/13/2023] levorphanol (LEVODROMORAN) 2 MG tablet Take 1 tablet (2 mg total) by mouth every 8 (eight) hours as needed for pain. 90  tablet 0   levothyroxine (SYNTHROID) 50 MCG tablet      metoprolol succinate (TOPROL-XL) 100 MG 24 hr tablet Take 100 mg by mouth once daily in the evening  0   omeprazole (PRILOSEC) 20 MG capsule Take 20 mg by mouth daily.     pregabalin (LYRICA) 150 MG capsule Take 150 mg by mouth 3 (three) times daily.     QUEtiapine (SEROQUEL) 50 MG tablet Take 1 tablet (50 mg total) by mouth at bedtime as needed. 90 tablet 0   traZODone (DESYREL) 50 MG tablet Take 1-2 tablets (50-100 mg total) by mouth at bedtime as needed for sleep. 180 tablet 0   No current facility-administered medications for this visit.     Musculoskeletal: Strength & Muscle Tone: within normal limits Gait & Station:  walks with walker  Patient leans: Backward  Psychiatric Specialty Exam: Review of Systems  Psychiatric/Behavioral: Negative.      Blood pressure 128/82, pulse 65, temperature (!) 97.4 F (36.3 C), temperature source Skin, height 5' (1.524 m), weight 242 lb 3.2 oz (109.9 kg).Body mass index is 47.3 kg/m.  General Appearance: Casual  Eye Contact:  Fair  Speech:  Clear and Coherent  Volume:  Normal  Mood:  Euthymic  Affect:  Congruent  Thought Process:  Goal Directed and Descriptions of Associations: Intact  Orientation:  Full (Time, Place, and Person)  Thought Content: Logical   Suicidal Thoughts:  No  Homicidal Thoughts:  No  Memory:  Immediate;   Fair Recent;   Fair Remote;   Fair  Judgement:  Fair  Insight:  Fair  Psychomotor Activity:  Normal  Concentration:  Concentration: Fair and Attention Span: Fair  Recall:  [employer] of Knowledge: Fair  Language: Fair  Akathisia:  No  Handed:  Right  AIMS (if indicated): done  Assets:  Communication Skills Desire for Improvement Financial Resources/Insurance Housing Intimacy Social Support Transportation  ADL's:  Intact  Cognition: WNL  Sleep:  Fair   Screenings: Midwife Visit from 09/16/2022 in [facility] Office Visit from 06/17/2022 in [facility] Office Visit from 04/16/2022 in [facility] Office Visit from 02/12/2022 in [facility] Office Visit from 01/16/2022 in [facility]  AIMS Total Score 0 0 0 0 0      GAD-7    Flowsheet Row Office Visit from 11/18/2022 in [facility] Office Visit from 09/16/2022 in [facility] Office Visit from 06/17/2022 in [facility] Office Visit from 04/16/2022 in [facility] Office Visit from 02/12/2022 in [facility]  Total GAD-7 Score 0 0 0 2 3      PHQ2-9    Flowsheet Row Office Visit from 11/18/2022 in [facility] Office Visit from 11/11/2022 in [facility] Visit from 09/16/2022 in [facility] Office Visit from 08/04/2022 in [facility] Visit from 06/17/2022 in [facility]  PHQ-2 Total Score 0 0 0 0 0  PHQ-9 Total Score 2 -- 3 -- 2      Flowsheet Row Office Visit from 11/18/2022 in [facility] Office Visit from 09/16/2022 in [facility] Office Visit from 06/17/2022 in [facility]  C-SSRS RISK CATEGORY No Risk No Risk No Risk        Assessment and Plan: Trista Ciocca is a 67 year old Caucasian female with history of MDD, GAD, chronic pain, hypothyroidism was evaluated in office today.  Patient  is currently stable , discussed tapering off Seroquel given long-term side effects including weight gain and the patient with morbid obesity, plan as noted below.  Plan MDD in remission Wellbutrin XL 150 mg p.o. daily-reduced dosage Will change Seroquel to 50 mg p.o. nightly as needed, patient could skip the Seroquel once this week, she could add 1 more day next week when she skips it and gradually taper it off. The nights that she does not take Seroquel she could take a higher dosage of trazodone-dosage increased as noted below.  Brief psychotic disorder-resolved Will monitor closely  Insomnia-stable Increase trazodone to 50-100 mg p.o. nightly as needed.   Consent: Patient/Guardian gives verbal consent for treatment and assignment of benefits for services provided during this visit. Patient/Guardian expressed understanding and agreed to proceed.   Follow-up in clinic in 4 to 6 weeks or sooner if needed.  This note was generated in part or whole with voice recognition software. Voice recognition is usually quite accurate but there are transcription errors that can and very often do occur. I apologize for any typographical errors that were not detected and corrected.    Jomarie Longs, MD 11/19/2022, 9:26 AM

## 2022-11-19 ENCOUNTER — Other Ambulatory Visit: Payer: Self-pay | Admitting: Internal Medicine

## 2022-11-19 DIAGNOSIS — R079 Chest pain, unspecified: Secondary | ICD-10-CM

## 2022-11-19 DIAGNOSIS — R0602 Shortness of breath: Secondary | ICD-10-CM

## 2022-11-25 ENCOUNTER — Encounter (HOSPITAL_COMMUNITY): Payer: Self-pay

## 2022-11-26 ENCOUNTER — Telehealth (HOSPITAL_COMMUNITY): Payer: Self-pay | Admitting: Emergency Medicine

## 2022-11-26 NOTE — Telephone Encounter (Signed)
Attempted to call patient regarding upcoming cardiac CT appointment. °Left message on voicemail with name and callback number °Sara Wallace RN Navigator Cardiac Imaging °[facility] °[phone] Office °[phone] Cell ° °

## 2022-11-27 ENCOUNTER — Ambulatory Visit
Admission: RE | Admit: 2022-11-27 | Discharge: 2022-11-27 | Disposition: A | Discharge status: Home / Self Care | Payer: Medicare Other | Source: Ambulatory Visit | Attending: Internal Medicine | Admitting: Internal Medicine

## 2022-11-27 DIAGNOSIS — R079 Chest pain, unspecified: Secondary | ICD-10-CM | POA: Diagnosis present

## 2022-11-27 DIAGNOSIS — I2510 Atherosclerotic heart disease of native coronary artery without angina pectoris: Secondary | ICD-10-CM | POA: Diagnosis not present

## 2022-11-27 DIAGNOSIS — R0602 Shortness of breath: Secondary | ICD-10-CM | POA: Insufficient documentation

## 2022-11-27 MED ORDER — SODIUM CHLORIDE 0.9 % IV SOLN: INTRAVENOUS | Status: DC

## 2022-11-27 MED ORDER — NITROGLYCERIN 0.4 MG SL SUBL: 0.4000 mg | SUBLINGUAL_TABLET | Freq: Once | SUBLINGUAL | Status: DC

## 2022-11-27 MED ORDER — IOHEXOL 350 MG/ML SOLN
100.0000 mL | Freq: Once | INTRAVENOUS | Status: AC | PRN
  Administered 2022-11-27: 100 mL via INTRAVENOUS

## 2022-11-27 MED ORDER — NITROGLYCERIN 0.4 MG SL SUBL
0.8000 mg | SUBLINGUAL_TABLET | Freq: Once | SUBLINGUAL | Status: AC
  Administered 2022-11-27: 0.8 mg via SUBLINGUAL

## 2022-11-27 NOTE — Progress Notes (Signed)
Patient tolerated CT well.  Vital signs stable encourage to drink water throughout day.Reasons explained and verbalized understanding.   

## 2023-01-12 ENCOUNTER — Telehealth (INDEPENDENT_AMBULATORY_CARE_PROVIDER_SITE_OTHER): Payer: Medicare Other | Admitting: Psychiatry

## 2023-01-12 ENCOUNTER — Encounter: Payer: Self-pay | Admitting: Psychiatry

## 2023-01-12 DIAGNOSIS — F3342 Major depressive disorder, recurrent, in full remission: Secondary | ICD-10-CM

## 2023-01-12 DIAGNOSIS — F23 Brief psychotic disorder: Secondary | ICD-10-CM

## 2023-01-12 DIAGNOSIS — F411 Generalized anxiety disorder: Secondary | ICD-10-CM

## 2023-01-12 DIAGNOSIS — G4701 Insomnia due to medical condition: Secondary | ICD-10-CM

## 2023-01-12 MED ORDER — BUPROPION HCL ER (XL) 150 MG PO TB24
150.0000 mg | ORAL_TABLET | Freq: Every day | ORAL | 3 refills | Status: DC
Start: 2023-01-12 — End: 2024-01-07

## 2023-01-12 NOTE — Progress Notes (Signed)
Virtual Visit via Video Note  I connected with Connie [name] on 01/12/23 at  2:00 PM EDT by a video enabled telemedicine application and verified that I am speaking with the correct person using two identifiers.  Location Provider Location : [facility] Patient Location : Home  Participants: Patient , Provider     I discussed the limitations of evaluation and management by telemedicine and the availability of in person appointments. The patient expressed understanding and agreed to proceed.   I discussed the assessment and treatment plan with the patient. The patient was provided an opportunity to ask questions and all were answered. The patient agreed with the plan and demonstrated an understanding of the instructions.   The patient was advised to call back or seek an in-person evaluation if the symptoms worsen or if the condition fails to improve as anticipated.   BH MD OP Progress Note  01/12/2023 2:25 PM Connie [name]  MRN:  [id number]  Chief Complaint:  Chief Complaint  Patient presents with   Follow-up   Anxiety   Depression   Medication Refill   HPI: Connie [name] is a 67 year old Caucasian female, married, lives in [place], has a history of MDD, GAD, brief psychotic disorder, insomnia, obstructive sleep apnea currently not on CPAP, fibromyalgia, hypothyroidism, polyarthralgia was evaluated by telemedicine today.  Patient today reports overall she is doing fairly well with regards to her mood symptoms.  Denies any significant mood swings, anxiety or depression.  She is currently tolerating her medications well.  She is currently compliant on the Wellbutrin, trazodone as needed.  She has been using the Seroquel only as needed and is tolerating it well.  Patient reports she continues to not use her CPAP, has been unable to get a new one.  She however reports her fatigue and hypersomnia during the day has improved and she sleeps okay at night.  She denies any  suicidality, homicidality or perceptual disturbances.  Patient denies any other concerns today.  Visit Diagnosis:    ICD-10-CM   1. MDD (major depressive disorder), recurrent, in full remission (HCC)  F33.42 buPROPion (WELLBUTRIN XL) 150 MG 24 hr tablet    2. GAD (generalized anxiety disorder)  F41.1     3. Brief psychotic disorder (HCC)  F23     4. Insomnia due to medical condition  G47.01    pain, RLS      Past Psychiatric History: I have reviewed past psychiatric history from progress note on 08/06/2017.  Past trials of Latuda, Wellbutrin, Cymbalta.  Past Medical History:  Past Medical History:  Diagnosis Date   Anemia    in the past   Anxiety    Arthritis    all over   Cervical spondylosis with myelopathy 2018   Chronic pain 2019   can't stand straight, uses walker for stability   DDD (degenerative disc disease), lumbar    Depression    Displacement of cervical intervertebral disc 2018   Dyspnea    HANDICAPPED   Fibromyalgia    Polymyalgia Rheumatica   GERD (gastroesophageal reflux disease)    Heart murmur    not treated or being followed   Hypertension    CONTROLLED   Hypothyroidism    Lumbar post-laminectomy syndrome 2018   Lumbosacral radiculitis 2018   Neuropathy due to medical condition (HCC)    feet, toes, fingers   Osteoarthritis    Osteoporosis    Other long term (current) drug therapy 2019   from pain management  Pain in the coccyx 2018   disorder of sacrum   Sleep apnea    Spondylolisthesis 2018   Spondylosis of lumbosacral region without myelopathy or radiculopathy 2018   Spondylosis with myelopathy, lumbar region 2018   Vitamin D deficiency     Past Surgical History:  Procedure Laterality Date   ABDOMINAL HYSTERECTOMY  2000   APPENDECTOMY     arm surgery  2002   BREAST BIOPSY Left 2005   benign   CATARACT EXTRACTION W/PHACO Right 10/21/2017   Procedure: CATARACT EXTRACTION PHACO AND INTRAOCULAR LENS PLACEMENT (IOC) RIGHT;  Surgeon:  Lockie Mola, MD;  Location: [facility];  Service: Ophthalmology;  Laterality: Right;  neck issues   CESAREAN SECTION     x 2   CHOLECYSTECTOMY     COLONOSCOPY WITH PROPOFOL N/A 08/25/2017   Procedure: COLONOSCOPY WITH PROPOFOL;  Surgeon: Toledo, Boykin Nearing, MD;  Location: [facility];  Service: Gastroenterology;  Laterality: N/A;   COLONOSCOPY WITH PROPOFOL N/A 02/25/2022   Procedure: COLONOSCOPY WITH PROPOFOL;  Surgeon: Regis Bill, MD;  Location: [facility];  Service: Endoscopy;  Laterality: N/A;   epidural injection  2018   steroids   GASTRIC BYPASS  2004   JOINT REPLACEMENT Bilateral 2005   knees   LUMBAR FUSION  2016   x 2/ L3-5   SHOULDER ARTHROSCOPY WITH ROTATOR CUFF REPAIR Right 2008   x 3   SPINAL CORD STIMULATOR IMPLANT  2018   [employer]   TEMPORAL ARTERY BIOPSY / LIGATION     TONSILLECTOMY      Family Psychiatric History: I have reviewed family psychiatric history from progress note on 08/06/2017.  Family History:  Family History  Problem Relation Age of Onset   Kidney disease Mother    Cancer Mother    Anxiety disorder Mother    Depression Mother    Breast cancer Mother    Cancer Father    Hypertension Father    Cancer Brother    Breast cancer Maternal Aunt    Breast cancer Cousin     Social History: I have reviewed social history from progress note on 08/06/2017. Social History   Socioeconomic History   Marital status: Married    Spouse name: brady    Number of children: 2   Years of education: Not on file   Highest education level: High school graduate  Occupational History    Comment: disabled   Tobacco Use   Smoking status: Former    Current packs/day: 0.00    Average packs/day: 0.5 packs/day for 20.0 years (10.0 ttl pk-yrs)    Types: Cigarettes    Start date: 05/21/1987    Quit date: 05/21/2007    Years since quitting: 15.6   Smokeless tobacco: Never  Vaping Use   Vaping status: Never Used  Substance and  Sexual Activity   Alcohol use: No   Drug use: No    Comment: managed by pain clinic   Sexual activity: Not Currently  Other Topics Concern   Not on file  Social History Narrative   Not on file   Social Determinants of Health   Financial Resource Strain: Medium Risk (05/20/2017)   Overall Financial Resource Strain (CARDIA)    Difficulty of Paying Living Expenses: Somewhat hard  Food Insecurity: No Food Insecurity (05/20/2017)   Hunger Vital Sign    Worried About Running Out of Food in the Last Year: Never true    Ran Out of Food in the Last Year: Never true  Transportation Needs: No Transportation Needs (05/20/2017)   PRAPARE - Administrator, Civil Service (Medical): No    Lack of Transportation (Non-Medical): No  Physical Activity: Inactive (05/20/2017)   Exercise Vital Sign    Days of Exercise per Week: 0 days    Minutes of Exercise per Session: 0 min  Stress: Not on file  Social Connections: Moderately Integrated (05/20/2017)   Social Connection and Isolation Panel [NHANES]    Frequency of Communication with Friends and Family: Three times a week    Frequency of Social Gatherings with Friends and Family: Once a week    Attends Religious Services: More than 4 times per year    Active Member of [employer] or Organizations: No    Attends Banker Meetings: Never    Marital Status: Married    Allergies:  Allergies  Allergen Reactions   Pedi-Pre Tape Spray [Wound Dressing Adhesive] Rash    Metabolic Disorder Labs: No results found for: "HGBA1C", "MPG" No results found for: "PROLACTIN" No results found for: "CHOL", "TRIG", "HDL", "CHOLHDL", "VLDL", "LDLCALC" No results found for: "TSH"  Therapeutic Level Labs: No results found for: "LITHIUM" No results found for: "VALPROATE" No results found for: "CBMZ"  Current Medications: Current Outpatient Medications  Medication Sig Dispense Refill   buPROPion (WELLBUTRIN XL) 150 MG 24 hr tablet Take 1 tablet  (150 mg total) by mouth daily. 90 tablet 3   celecoxib (CELEBREX) 100 MG capsule Take by mouth.     Cyanocobalamin (VITAMIN B-12 IJ) Inject as directed. Once a month     furosemide (LASIX) 40 MG tablet Take by mouth.     levorphanol (LEVODROMORAN) 2 MG tablet Take 1 tablet (2 mg total) by mouth every 8 (eight) hours as needed for pain. 90 tablet 0   [START ON 01/13/2023] levorphanol (LEVODROMORAN) 2 MG tablet Take 1 tablet (2 mg total) by mouth every 8 (eight) hours as needed for pain. 90 tablet 0   levothyroxine (SYNTHROID) 50 MCG tablet      metoprolol succinate (TOPROL-XL) 100 MG 24 hr tablet Take 100 mg by mouth once daily in the evening  0   omeprazole (PRILOSEC) 20 MG capsule Take 20 mg by mouth daily.     pregabalin (LYRICA) 150 MG capsule Take 150 mg by mouth 3 (three) times daily.     QUEtiapine (SEROQUEL) 50 MG tablet Take 1 tablet (50 mg total) by mouth at bedtime as needed. 90 tablet 0   traZODone (DESYREL) 50 MG tablet Take 1-2 tablets (50-100 mg total) by mouth at bedtime as needed for sleep. 180 tablet 0   No current facility-administered medications for this visit.     Musculoskeletal: Strength & Muscle Tone:  UTA Gait & Station:  Seated Patient leans: N/A  Psychiatric Specialty Exam: Review of Systems  Psychiatric/Behavioral: Negative.      There were no vitals taken for this visit.There is no height or weight on file to calculate BMI.  General Appearance: Fairly Groomed  Eye Contact:  Fair  Speech:  Clear and Coherent  Volume:  Normal  Mood:  Euthymic  Affect:  Congruent  Thought Process:  Goal Directed and Descriptions of Associations: Intact  Orientation:  Full (Time, Place, and Person)  Thought Content: Logical   Suicidal Thoughts:  No  Homicidal Thoughts:  No  Memory:  Immediate;   Fair Recent;   Fair Remote;   Fair  Judgement:  Fair  Insight:  Fair  Psychomotor Activity:  Normal  Concentration:  Concentration: Fair and Attention Span: Fair  Recall:   [employer] of Knowledge: Fair  Language: Good  Akathisia:  No  Handed:  Right  AIMS (if indicated): not done  Assets:  Communication Skills Desire for Improvement Housing Social Support  ADL's:  Intact  Cognition: WNL  Sleep:  Fair   Screenings: Midwife Visit from 09/16/2022 in [facility] Office Visit from 06/17/2022 in [facility] Office Visit from 04/16/2022 in [facility] Office Visit from 02/12/2022 in [facility] Office Visit from 01/16/2022 in [facility]  AIMS Total Score 0 0 0 0 0      GAD-7    Flowsheet Row Office Visit from 11/18/2022 in [facility] Office Visit from 09/16/2022 in [facility] Office Visit from 06/17/2022 in [facility] Office Visit from 04/16/2022 in [facility] Office Visit from 02/12/2022 in [facility]  Total GAD-7 Score 0 0 0 2 3      PHQ2-9    Flowsheet Row Office Visit from 11/18/2022 in [facility] Office Visit from 11/11/2022 in [facility] Visit from 09/16/2022 in [facility] Office Visit from 08/04/2022 in [facility] Visit from 06/17/2022 in [facility]  PHQ-2 Total Score 0 0 0 0 0  PHQ-9 Total Score 2 -- 3 -- 2      Flowsheet Row Video Visit from 01/12/2023 in [facility] Office Visit from 11/18/2022 in [facility] Office Visit from 09/16/2022 in [facility]  C-SSRS RISK CATEGORY No Risk No Risk No Risk        Assessment and Plan: Connie [name] is a 67 year old Caucasian female with history of MDD, GAD, chronic pain, hypothyroidism was evaluated by telemedicine today.  Patient is currently stable.  Plan as noted below.  Plan MDD in remission Wellbutrin XL 150 mg p.o. daily-reduced dosage Patient currently using Seroquel 50 mg p.o. nightly as needed only.  She is tolerating it well.   Brief psychotic disorder-resolved Will monitor closely  Insomnia-stable Trazodone 50-100 mg p.o. nightly as needed Patient may benefit from CPAP for obstructive sleep apnea.  Patient to contact her provider.    Consent: Patient/Guardian gives verbal consent for treatment and assignment of benefits for services provided during this visit. Patient/Guardian expressed understanding and agreed to proceed.   Follow-up in clinic in 5 months or sooner if needed.  This note was generated in part or whole with voice recognition software. Voice recognition is usually quite accurate but there are transcription errors that can and very often do occur. I apologize for any typographical errors that were not detected and corrected.    Jomarie Longs, MD 01/12/2023, 2:25 PM

## 2023-02-10 ENCOUNTER — Ambulatory Visit
Payer: Medicare Other | Attending: Student in an Organized Health Care Education/Training Program | Admitting: Student in an Organized Health Care Education/Training Program

## 2023-02-10 ENCOUNTER — Encounter: Payer: Self-pay | Admitting: Student in an Organized Health Care Education/Training Program

## 2023-02-10 VITALS — BP 130/40 | HR 62 | Temp 96.6°F | Resp 17 | Ht 60.0 in | Wt 230.0 lb

## 2023-02-10 DIAGNOSIS — Z9689 Presence of other specified functional implants: Secondary | ICD-10-CM | POA: Diagnosis not present

## 2023-02-10 DIAGNOSIS — M5136 Other intervertebral disc degeneration, lumbar region with discogenic back pain only: Secondary | ICD-10-CM | POA: Diagnosis not present

## 2023-02-10 DIAGNOSIS — G894 Chronic pain syndrome: Secondary | ICD-10-CM

## 2023-02-10 DIAGNOSIS — Z981 Arthrodesis status: Secondary | ICD-10-CM

## 2023-02-10 DIAGNOSIS — M5416 Radiculopathy, lumbar region: Secondary | ICD-10-CM | POA: Diagnosis not present

## 2023-02-10 DIAGNOSIS — M4807 Spinal stenosis, lumbosacral region: Secondary | ICD-10-CM

## 2023-02-10 MED ORDER — LEVORPHANOL TARTRATE 2 MG PO TABS
2.0000 mg | ORAL_TABLET | Freq: Three times a day (TID) | ORAL | 0 refills | Status: AC | PRN
Start: 2023-02-13 — End: 2023-03-15

## 2023-02-10 MED ORDER — LEVORPHANOL TARTRATE 2 MG PO TABS
2.0000 mg | ORAL_TABLET | Freq: Three times a day (TID) | ORAL | 0 refills | Status: DC | PRN
Start: 2023-04-14 — End: 2023-05-12

## 2023-02-10 MED ORDER — LEVORPHANOL TARTRATE 2 MG PO TABS
2.0000 mg | ORAL_TABLET | Freq: Three times a day (TID) | ORAL | 0 refills | Status: AC | PRN
Start: 2023-03-15 — End: 2023-04-14

## 2023-02-10 NOTE — Patient Instructions (Signed)

## 2023-02-10 NOTE — Progress Notes (Signed)
PROVIDER NOTE: Information contained herein reflects review and annotations entered in association with encounter. Interpretation of such information and data should be left to medically-trained personnel. Information provided to patient can be located elsewhere in the medical record under "Patient Instructions". Document created using STT-dictation technology, any transcriptional errors that may result from process are unintentional.    Patient: Connie [name]  Service Category: E/M  Provider: Edward Jolly, MD  DOB: 06-Jun-1955  DOS: 02/10/2023  Specialty: Interventional Pain Management  MRN: [id number]  Setting: Ambulatory outpatient  PCP: Wilford Corner, PA-C  Type: Established Patient    Referring Provider: Wilford Corner,*  Location: Office  Delivery: Face-to-face     HPI  Connie [name], a 67 y.o. year old female, is here today because of her Lumbar radiculopathy [M54.16]. Ms. Hewell primary complain today is Pain (Left buttock) and Shoulder Pain (bilat) Last encounter: My last encounter with her was on 11/11/22 Pertinent problems: Connie [name] has Degenerative joint disease of cervical and lumbar spine; Lumbar spondylosis; History of lumbar fusion; Lumbar degenerative disc disease; Fibromyalgia; Chronic pain syndrome; Spinal cord stimulator status; Acquired spondylolisthesis; Chronic pain; Generalized osteoarthritis of multiple sites; Spinal stenosis of lumbar region without neurogenic claudication; and Lumbar post-laminectomy syndrome on their pertinent problem list. Pain Assessment: Severity of Chronic pain is reported as a 7 /10. Location: Buttocks Left/to left ankle. Onset: More than a month ago. Quality:  . Timing: Constant. Modifying factor(s): meds. Vitals:  height is 5' (1.524 m) and weight is 230 lb (104.3 kg). Her temperature is 96.6 F (35.9 C) (abnormal). Her blood pressure is 130/40 (abnormal) and her pulse is 62. Her respiration is 17 and oxygen saturation is  94%.   Reason for encounter: medication management.   [name] follows up today for medication management of her levorphanol.   No change in medical history since last visit.  Patient continues multimodal pain regimen as prescribed.  States that it provides pain relief and improvement in functional status. Increased low back and radiating leg pain, previous caudal ESI was March 6th which provided 80% pain relief for approx 6 months Otherwise pt is doing well  Pharmacotherapy Assessment  Analgesic: Levorphanol 2 mg BID  prn, #90/month    Monitoring: [place] PMP: PDMP reviewed during this encounter.       Pharmacotherapy: No side-effects or adverse reactions reported. Compliance: No problems identified. Effectiveness: Clinically acceptable.  Nonah Mattes, RN  02/10/2023  1:48 PM  Sign when Signing Visit Nursing Pain Medication Assessment:  Safety precautions to be maintained throughout the outpatient stay will include: orient to surroundings, keep bed in low position, maintain call bell within reach at all times, provide assistance with transfer out of bed and ambulation.  Medication Inspection Compliance: Pill count conducted under aseptic conditions, in front of the patient. Neither the pills nor the bottle was removed from the patient's sight at any time. Once count was completed pills were immediately returned to the patient in their original bottle.  Medication:  Levorphanol Pill/Patch Count:  12 of 90 pills remain Pill/Patch Appearance: Markings consistent with prescribed medication Bottle Appearance: Standard pharmacy container. Clearly labeled. Filled Date: 35 / 18 / 2024 Last Medication intake:  Today    UDS:  Summary  Date Value Ref Range Status  06/05/2022 Note  Final    Comment:    ==================================================================== [employer] Select 13 (MW) ==================================================================== Test  Result       Flag       Units    NO DRUGS DETECTED. ==================================================================== Test                      Result    Flag   Units      Ref Range   Creatinine              167              mg/dL      >=16 ==================================================================== Declared Medications:  The flagging and interpretation on this report are based on the  following declared medications.  Unexpected results may arise from  inaccuracies in the declared medications.   **Note: The testing scope of this panel does not include the  following reported medications:   Bupropion (Wellbutrin XL)  Celecoxib (Celebrex)  Furosemide  Levorphanol  Levothyroxine  Metoprolol  Omeprazole  Pregabalin (Lyrica)  Quetiapine (Seroquel)  Trazodone ==================================================================== For clinical consultation, please call [phone]. ====================================================================      ROS  Constitutional: Denies any fever or chills Gastrointestinal: No reported hemesis, hematochezia, vomiting, or acute GI distress Musculoskeletal:  Low back, bilateral leg pain Neurological: No reported episodes of acute onset apraxia, aphasia, dysarthria, agnosia, amnesia, paralysis, loss of coordination, or loss of consciousness  Medication Review  Cyanocobalamin, QUEtiapine, buPROPion, celecoxib, furosemide, levorphanol, levothyroxine, metoprolol succinate, omeprazole, pregabalin, and traZODone  History Review  Allergy: Connie [name] is allergic to pedi-pre tape spray [wound dressing adhesive]. Drug: Connie [name]  reports no history of drug use. Alcohol:  reports no history of alcohol use. Tobacco:  reports that she quit smoking about 15 years ago. Her smoking use included cigarettes. She started smoking about 35 years ago. She has a 10 pack-year smoking history. She has never used smokeless tobacco. Social: Ms.  [name]  reports that she quit smoking about 15 years ago. Her smoking use included cigarettes. She started smoking about 35 years ago. She has a 10 pack-year smoking history. She has never used smokeless tobacco. She reports that she does not drink alcohol and does not use drugs. Medical:  has a past medical history of Anemia, Anxiety, Arthritis, Cervical spondylosis with myelopathy (2018), Chronic pain (2019), DDD (degenerative disc disease), lumbar, Depression, Displacement of cervical intervertebral disc (2018), Dyspnea, Fibromyalgia, GERD (gastroesophageal reflux disease), Heart murmur, Hypertension, Hypothyroidism, Lumbar post-laminectomy syndrome (2018), Lumbosacral radiculitis (2018), Neuropathy due to medical condition (HCC), Osteoarthritis, Osteoporosis, Other long term (current) drug therapy (2019), Pain in the coccyx (2018), Sleep apnea, Spondylolisthesis (2018), Spondylosis of lumbosacral region without myelopathy or radiculopathy (2018), Spondylosis with myelopathy, lumbar region (2018), and Vitamin D deficiency. Surgical: Ms. Tessmann  has a past surgical history that includes Cesarean section; Gastric bypass (2004); Lumbar fusion (2016); Spinal cord stimulator implant (2018); Cholecystectomy; Appendectomy; Tonsillectomy; Shoulder arthroscopy with rotator cuff repair (Right, 2008); Joint replacement (Bilateral, 2005); Abdominal hysterectomy (2000); epidural injection (2018); arm surgery (2002); Breast biopsy (Left, 2005); Colonoscopy with propofol (N/A, 08/25/2017); Temporal artery biopsy / ligation; Cataract extraction w/PHACO (Right, 10/21/2017); and Colonoscopy with propofol (N/A, 02/25/2022). Family: family history includes Anxiety disorder in her mother; Breast cancer in her cousin, maternal aunt, and mother; Cancer in her brother, father, and mother; Depression in her mother; Hypertension in her father; Kidney disease in her mother.  Laboratory Chemistry Profile   Renal Lab Results   Component Value Date   BUN 18 06/23/2017   CREATININE 0.60 06/23/2017   GFRAA >60 06/23/2017  GFRNONAA >60 06/23/2017    Hepatic Lab Results  Component Value Date   AST 20 06/23/2017   ALT 9 (L) 06/23/2017   ALBUMIN 3.6 06/23/2017   ALKPHOS 88 06/23/2017    Electrolytes Lab Results  Component Value Date   NA 135 06/23/2017   K 4.9 06/23/2017   CL 102 06/23/2017   CALCIUM 8.5 (L) 06/23/2017    Bone No results found for: "VD25OH", "VD125OH2TOT", "UE4540JW1", "XB1478GN5", "25OHVITD1", "25OHVITD2", "25OHVITD3", "TESTOFREE", "TESTOSTERONE"  Inflammation (CRP: Acute Phase) (ESR: Chronic Phase) No results found for: "CRP", "ESRSEDRATE", "LATICACIDVEN"       Note: Above Lab results reviewed.  Recent Imaging Review  CT CORONARY MORPH W/CTA COR W/SCORE W/CA W/CM &/OR WO/CM Addendum: ADDENDUM REPORT: 12/04/2022 14:18   EXAM:  OVER-READ INTERPRETATION  CT CHEST   The following report is an over-read performed by radiologist Dr.  Elnoria Howard [facility] on 12/04/2022. This over-read  does not include interpretation of cardiac or coronary anatomy or  pathology. The cardiovascular interpretation by the cardiologist is  attached.   COMPARISON:  Chest radiographs dated 01/12/2019   FINDINGS:  No enlarged lymph nodes. Unremarkable distal esophagus and included  upper abdomen.   Clear lungs. No lung nodules or pleural fluid. Neural stimulator  leads in the lower thoracic spinal canal. Thoracic spine  degenerative changes.   IMPRESSION:  No acute abnormality.   Electronically Signed    By: Beckie Salts M.D.    On: 12/04/2022 14:18 Narrative: CLINICAL DATA:  Chest pain  EXAM: Cardiac/Coronary  CTA  TECHNIQUE: The patient was scanned on a Siemens Somatom force scanner.  : A retrospective scan was triggered in the ascending thoracic aorta. Axial non-contrast 3 mm slices were carried out through the heart. The data set was analyzed on a dedicated work  station and scored using the Agatson method. Gantry rotation speed was 66 msecs and collimation was .6 mm. 0.8 mg of sl NTG was given. The 3D data set was reconstructed in 5% intervals of the 60-95 % of the R-R cycle. Diastolic phases were analyzed on a dedicated work station using MPR, MIP and VRT modes. The patient received 100 cc of contrast.  FINDINGS: Aorta:  Normal size.  No calcifications.  No dissection.  Aortic Valve:  Trileaflet.  No calcifications.  Coronary Arteries:  Normal coronary origin.  Right dominance.  RCA is a dominant artery. There is no plaque.  Left main gives rise to LAD and LCX arteries. LM has no disease.  LAD has calcified plaque proximally causing minimal stenosis (<25%).  LCX is a non-dominant artery. There is calcified plaque proximally causing minimal stenosis (<25%).  Other findings:  Normal pulmonary vein drainage into the left atrium.  Normal left atrial appendage without a thrombus.  Normal size of the pulmonary artery.  IMPRESSION: 1. Coronary calcium score of 133. This was 83rd percentile for age and sex matched control.  2. Normal coronary origin with right dominance.  3. Minimal proximal LAD and LCx stenosis (<25%).  4. CAD-RADS 1. Minimal non-obstructive CAD (0-24%). Consider preventive therapy and risk factor modification.  Electronically Signed: By: Debbe Odea M.D. On: 11/27/2022 15:16 Note: Reviewed        Physical Exam  General appearance: Well nourished, well developed, and well hydrated. In no apparent acute distress Mental status: Alert, oriented x 3 (person, place, & time)       Respiratory: No evidence of acute respiratory distress Eyes: PERLA Vitals: BP (!) 130/40   Pulse  62   Temp (!) 96.6 F (35.9 C)   Resp 17   Ht 5' (1.524 m)   Wt 230 lb (104.3 kg)   SpO2 94%   BMI 44.92 kg/m  BMI: Estimated body mass index is 44.92 kg/m as calculated from the following:   Height as of this encounter: 5'  (1.524 m).   Weight as of this encounter: 230 lb (104.3 kg). Ideal: Ideal body weight: 45.5 kg (100 lb 4.9 oz) Adjusted ideal body weight: 69 kg (152 lb 3 oz)  Low back with radiating leg pain, dermatomal in nature  Assessment   Diagnosis Status  1. Lumbar radiculopathy   2. Degeneration of intervertebral disc of lumbar region with discogenic back pain   3. Spinal cord stimulator status   4. History of lumbar fusion   5. Spinal stenosis of lumbosacral region   6. Chronic pain syndrome      Persistent Persistent Persistent    Plan of Care  1. Lumbar radiculopathy - levorphanol (LEVODROMORAN) 2 MG tablet; Take 1 tablet (2 mg total) by mouth every 8 (eight) hours as needed for pain.  Dispense: 90 tablet; Refill: 0 - levorphanol (LEVODROMORAN) 2 MG tablet; Take 1 tablet (2 mg total) by mouth every 8 (eight) hours as needed for pain.  Dispense: 90 tablet; Refill: 0 - levorphanol (LEVODROMORAN) 2 MG tablet; Take 1 tablet (2 mg total) by mouth every 8 (eight) hours as needed for pain.  Dispense: 90 tablet; Refill: 0 - Caudal Epidural Injection; Future  2. Degeneration of intervertebral disc of lumbar region with discogenic back pain  3. Spinal cord stimulator status  4. History of lumbar fusion  5. Spinal stenosis of lumbosacral region - levorphanol (LEVODROMORAN) 2 MG tablet; Take 1 tablet (2 mg total) by mouth every 8 (eight) hours as needed for pain.  Dispense: 90 tablet; Refill: 0 - levorphanol (LEVODROMORAN) 2 MG tablet; Take 1 tablet (2 mg total) by mouth every 8 (eight) hours as needed for pain.  Dispense: 90 tablet; Refill: 0 - levorphanol (LEVODROMORAN) 2 MG tablet; Take 1 tablet (2 mg total) by mouth every 8 (eight) hours as needed for pain.  Dispense: 90 tablet; Refill: 0 - Caudal Epidural Injection; Future  6. Chronic pain syndrome - levorphanol (LEVODROMORAN) 2 MG tablet; Take 1 tablet (2 mg total) by mouth every 8 (eight) hours as needed for pain.  Dispense: 90  tablet; Refill: 0 - levorphanol (LEVODROMORAN) 2 MG tablet; Take 1 tablet (2 mg total) by mouth every 8 (eight) hours as needed for pain.  Dispense: 90 tablet; Refill: 0 - levorphanol (LEVODROMORAN) 2 MG tablet; Take 1 tablet (2 mg total) by mouth every 8 (eight) hours as needed for pain.  Dispense: 90 tablet; Refill: 0 - Caudal Epidural Injection; Future   Ms. Alynda Loren Prime has a current medication list which includes the following long-term medication(s): bupropion, levothyroxine, metoprolol succinate, omeprazole, pregabalin, quetiapine, and trazodone.  Pharmacotherapy (Medications Ordered): Meds ordered this encounter  Medications   levorphanol (LEVODROMORAN) 2 MG tablet    Sig: Take 1 tablet (2 mg total) by mouth every 8 (eight) hours as needed for pain.    Dispense:  90 tablet    Refill:  0   levorphanol (LEVODROMORAN) 2 MG tablet    Sig: Take 1 tablet (2 mg total) by mouth every 8 (eight) hours as needed for pain.    Dispense:  90 tablet    Refill:  0   levorphanol (LEVODROMORAN) 2 MG tablet  Sig: Take 1 tablet (2 mg total) by mouth every 8 (eight) hours as needed for pain.    Dispense:  90 tablet    Refill:  0   Orders:  Orders Placed This Encounter  Procedures   Caudal Epidural Injection    Standing Status:   Future    Standing Expiration Date:   05/13/2023    Scheduling Instructions:     Laterality: LEFT     Level(s): Sacrococcygeal canal (Tailbone area)     Sedation: without     Scheduling Timeframe: As soon as pre-approved    Order Specific Question:   Where will this procedure be performed?    Answer:   [facility]   Follow-up plan:   Return in about 29 days (around 03/11/2023) for Left caudal ESI, in clinic [facility].    Recent Visits No visits were found meeting these conditions. Showing recent visits within past 90 days and meeting all other requirements Today's Visits Date Type Provider Dept  02/10/23 Office Visit Edward Jolly, MD [facility]  Showing today's visits and meeting all other requirements Future Appointments No visits were found meeting these conditions. Showing future appointments within next 90 days and meeting all other requirements  I discussed the assessment and treatment plan with the patient. The patient was provided an opportunity to ask questions and all were answered. The patient agreed with the plan and demonstrated an understanding of the instructions.  Patient advised to call back or seek an in-person evaluation if the symptoms or condition worsens.  Duration of encounter: .  Note by: Edward Jolly, MD Date: 02/10/2023; Time: 2:25 PM

## 2023-02-10 NOTE — Progress Notes (Signed)
Nursing Pain Medication Assessment:  Safety precautions to be maintained throughout the outpatient stay will include: orient to surroundings, keep bed in low position, maintain call bell within reach at all times, provide assistance with transfer out of bed and ambulation.  Medication Inspection Compliance: Pill count conducted under aseptic conditions, in front of the patient. Neither the pills nor the bottle was removed from the patient's sight at any time. Once count was completed pills were immediately returned to the patient in their original bottle.  Medication:  Levorphanol Pill/Patch Count:  12 of 90 pills remain Pill/Patch Appearance: Markings consistent with prescribed medication Bottle Appearance: Standard pharmacy container. Clearly labeled. Filled Date: 84 / 18 / 2024 Last Medication intake:  Today

## 2023-02-13 ENCOUNTER — Other Ambulatory Visit: Payer: Self-pay | Admitting: Psychiatry

## 2023-02-13 DIAGNOSIS — G4701 Insomnia due to medical condition: Secondary | ICD-10-CM

## 2023-03-16 ENCOUNTER — Ambulatory Visit (HOSPITAL_BASED_OUTPATIENT_CLINIC_OR_DEPARTMENT_OTHER): Payer: Medicare Other | Admitting: Student in an Organized Health Care Education/Training Program

## 2023-03-16 ENCOUNTER — Other Ambulatory Visit: Payer: Self-pay | Admitting: Student in an Organized Health Care Education/Training Program

## 2023-03-16 ENCOUNTER — Encounter: Payer: Self-pay | Admitting: Student in an Organized Health Care Education/Training Program

## 2023-03-16 ENCOUNTER — Ambulatory Visit
Admission: RE | Admit: 2023-03-16 | Discharge: 2023-03-16 | Disposition: A | Discharge status: Home / Self Care | Payer: Medicare Other | Source: Ambulatory Visit | Attending: Student in an Organized Health Care Education/Training Program | Admitting: Student in an Organized Health Care Education/Training Program

## 2023-03-16 DIAGNOSIS — M4807 Spinal stenosis, lumbosacral region: Secondary | ICD-10-CM | POA: Insufficient documentation

## 2023-03-16 DIAGNOSIS — M5416 Radiculopathy, lumbar region: Secondary | ICD-10-CM | POA: Insufficient documentation

## 2023-03-16 DIAGNOSIS — R52 Pain, unspecified: Secondary | ICD-10-CM

## 2023-03-16 DIAGNOSIS — G894 Chronic pain syndrome: Secondary | ICD-10-CM | POA: Insufficient documentation

## 2023-03-16 MED ORDER — IOHEXOL 180 MG/ML  SOLN
10.0000 mL | Freq: Once | INTRAMUSCULAR | Status: AC
  Administered 2023-03-16: 10 mL via EPIDURAL

## 2023-03-16 MED ORDER — ROPIVACAINE HCL 2 MG/ML IJ SOLN
2.0000 mL | Freq: Once | INTRAMUSCULAR | Status: AC
  Administered 2023-03-16: 2 mL via EPIDURAL

## 2023-03-16 MED ORDER — IOHEXOL 180 MG/ML  SOLN
INTRAMUSCULAR | Status: AC
  Filled 2023-03-16: qty 20

## 2023-03-16 MED ORDER — LIDOCAINE HCL (PF) 2 % IJ SOLN
INTRAMUSCULAR | Status: AC
  Filled 2023-03-16: qty 10

## 2023-03-16 MED ORDER — ROPIVACAINE HCL 2 MG/ML IJ SOLN
INTRAMUSCULAR | Status: AC
  Filled 2023-03-16: qty 20

## 2023-03-16 MED ORDER — SODIUM CHLORIDE 0.9% FLUSH
2.0000 mL | Freq: Once | INTRAVENOUS | Status: AC
  Administered 2023-03-16: 2 mL

## 2023-03-16 MED ORDER — DEXAMETHASONE SODIUM PHOSPHATE 10 MG/ML IJ SOLN
10.0000 mg | Freq: Once | INTRAMUSCULAR | Status: AC
  Administered 2023-03-16: 10 mg

## 2023-03-16 MED ORDER — LIDOCAINE HCL 2 % IJ SOLN
20.0000 mL | Freq: Once | INTRAMUSCULAR | Status: AC
  Administered 2023-03-16: 100 mg

## 2023-03-16 MED ORDER — SODIUM CHLORIDE (PF) 0.9 % IJ SOLN
INTRAMUSCULAR | Status: AC
  Filled 2023-03-16: qty 10

## 2023-03-16 MED ORDER — DEXAMETHASONE SODIUM PHOSPHATE 10 MG/ML IJ SOLN
INTRAMUSCULAR | Status: AC
  Filled 2023-03-16: qty 1

## 2023-03-16 NOTE — Progress Notes (Signed)
PROVIDER NOTE: Interpretation of information contained herein should be left to medically-trained personnel. Specific patient instructions are provided elsewhere under "Patient Instructions" section of medical record. This document was created in part using STT-dictation technology, any transcriptional errors that may result from this process are unintentional.  Patient: Connie [name] Type: Established DOB: March 17, 1956 MRN: [id number] PCP: Wilford Corner, PA-C  Service: Procedure DOS: 03/16/2023 Setting: Ambulatory Location: Ambulatory outpatient facility Delivery: Face-to-face Provider: Edward Jolly, MD Specialty: Interventional Pain Management Specialty designation: 09 Location: Outpatient facility Ref. Prov.: Edward Jolly, MD    Primary Reason for Visit: Interventional Pain Management Treatment. CC: Back Pain (lower)    Procedure:          Anesthesia, Analgesia, Anxiolysis:  Type: Therapeutic Epidural Steroid Injection  Region: Caudal Level: Sacrococcygeal   Laterality: Midline aiming at the left  Anesthesia: Local (1-2% Lidocaine)  Anxiolysis: None  Sedation: None  Guidance: Fluoroscopy           Position: Prone   1. Lumbar radiculopathy   2. Spinal stenosis of lumbosacral region   3. Chronic pain syndrome    NAS-11 Pain score:   Pre-procedure: 8 /10   Post-procedure: 0-No pain/10     Pre-op H&P Assessment:  Connie [name] is a 67 y.o. (year old), female patient, seen today for interventional treatment. She  has a past surgical history that includes Cesarean section; Gastric bypass (2004); Lumbar fusion (2016); Spinal cord stimulator implant (2018); Cholecystectomy; Appendectomy; Tonsillectomy; Shoulder arthroscopy with rotator cuff repair (Right, 2008); Joint replacement (Bilateral, 2005); Abdominal hysterectomy (2000); epidural injection (2018); arm surgery (2002); Breast biopsy (Left, 2005); Colonoscopy with propofol (N/A, 08/25/2017); Temporal artery biopsy /  ligation; Cataract extraction w/PHACO (Right, 10/21/2017); and Colonoscopy with propofol (N/A, 02/25/2022). Connie [name] has a current medication list which includes the following prescription(s): bupropion, celecoxib, cyanocobalamin, furosemide, levorphanol, [START ON 04/14/2023] levorphanol, levothyroxine, metoprolol succinate, omeprazole, pregabalin, quetiapine, and trazodone, and the following Facility-Administered Medications: dexamethasone, iohexol, lidocaine, ropivacaine (pf) 2 mg/ml (0.2%), and sodium chloride flush. Her primarily concern today is the Back Pain (lower)  Initial Vital Signs:  Pulse/HCG Rate: (!) 56ECG Heart Rate: (!) 55 Temp: (!) 97.5 F (36.4 C) Resp: 18 BP:  (!) 104/48 SpO2: 97 %  BMI: Estimated body mass index is 43.75 kg/m as calculated from the following:   Height as of this encounter: 5' (1.524 m).   Weight as of this encounter: 224 lb (101.6 kg).  Risk Assessment: Allergies: Reviewed. She has No Known Allergies.  Allergy Precautions: None required Coagulopathies: Reviewed. None identified.  Blood-thinner therapy: None at this time Active Infection(s): Reviewed. None identified. Connie [name] is afebrile  Site Confirmation: Connie [name] was asked to confirm the procedure and laterality before marking the site Procedure checklist: Completed Consent: Before the procedure and under the influence of no sedative(s), amnesic(s), or anxiolytics, the patient was informed of the treatment options, risks and possible complications. To fulfill our ethical and legal obligations, as recommended by the American Medical Association's Code of Ethics, I have informed the patient of my clinical impression; the nature and purpose of the treatment or procedure; the risks, benefits, and possible complications of the intervention; the alternatives, including doing nothing; the risk(s) and benefit(s) of the alternative treatment(s) or procedure(s); and the risk(s) and benefit(s) of doing  nothing. The patient was provided information about the general risks and possible complications associated with the procedure. These may include, but are not limited to: failure to achieve desired goals, infection, bleeding, organ or  nerve damage, allergic reactions, paralysis, and death. In addition, the patient was informed of those risks and complications associated to Spine-related procedures, such as failure to decrease pain; infection (i.e.: Meningitis, epidural or intraspinal abscess); bleeding (i.e.: epidural hematoma, subarachnoid hemorrhage, or any other type of intraspinal or peri-dural bleeding); organ or nerve damage (i.e.: Any type of peripheral nerve, nerve root, or spinal cord injury) with subsequent damage to sensory, motor, and/or autonomic systems, resulting in permanent pain, numbness, and/or weakness of one or several areas of the body; allergic reactions; (i.e.: anaphylactic reaction); and/or death. Furthermore, the patient was informed of those risks and complications associated with the medications. These include, but are not limited to: allergic reactions (i.e.: anaphylactic or anaphylactoid reaction(s)); adrenal axis suppression; blood sugar elevation that in diabetics may result in ketoacidosis or comma; water retention that in patients with history of congestive heart failure may result in shortness of breath, pulmonary edema, and decompensation with resultant heart failure; weight gain; swelling or edema; medication-induced neural toxicity; particulate matter embolism and blood vessel occlusion with resultant organ, and/or nervous system infarction; and/or aseptic necrosis of one or more joints. Finally, the patient was informed that Medicine is not an exact science; therefore, there is also the possibility of unforeseen or unpredictable risks and/or possible complications that may result in a catastrophic outcome. The patient indicated having understood very clearly. We have given  the patient no guarantees and we have made no promises. Enough time was given to the patient to ask questions, all of which were answered to the patient's satisfaction. Connie [name] has indicated that she wanted to continue with the procedure. Attestation: I, the ordering provider, attest that I have discussed with the patient the benefits, risks, side-effects, alternatives, likelihood of achieving goals, and potential problems during recovery for the procedure that I have provided informed consent. Date  Time: 03/16/2023 11:11 AM  Pre-Procedure Preparation:  Monitoring: As per clinic protocol. Respiration, ETCO2, SpO2, BP, heart rate and rhythm monitor placed and checked for adequate function Safety Precautions: Patient was assessed for positional comfort and pressure points before starting the procedure. Time-out: I initiated and conducted the "Time-out" before starting the procedure, as per protocol. The patient was asked to participate by confirming the accuracy of the "Time Out" information. Verification of the correct person, site, and procedure were performed and confirmed by me, the nursing staff, and the patient. "Time-out" conducted as per Joint Commission's Universal Protocol (UP.01.01.01). Time: 1134  Description of Procedure:          Target Area: Caudal Epidural Canal. Approach: Midline approach. Area Prepped: Entire Posterior Sacrococcygeal Region DuraPrep (Iodine Povacrylex [0.7% available iodine] and Isopropyl Alcohol, 74% w/w) Safety Precautions: Aspiration looking for blood return was conducted prior to all injections. At no point did we inject any substances, as a needle was being advanced. No attempts were made at seeking any paresthesias. Safe injection practices and needle disposal techniques used. Medications properly checked for expiration dates. SDV (single dose vial) medications used. Description of the Procedure: Protocol guidelines were followed. The patient was placed in  position over the fluoroscopy table. The target area was identified and the area prepped in the usual manner. Skin & deeper tissues infiltrated with local anesthetic. Appropriate amount of time allowed to pass for local anesthetics to take effect. The procedure needles were then advanced to the target area. Proper needle placement secured. Negative aspiration confirmed. Solution injected in intermittent fashion, asking for systemic symptoms every 0.5cc of injectate. The needles were  then removed and the area cleansed, making sure to leave some of the prepping solution back to take advantage of its long term bactericidal properties. Vitals:   03/16/23 1115 03/16/23 1130 03/16/23 1136 03/16/23 1142  BP: (!) 104/48 (!) 143/59 123/66 113/63  Pulse: (!) 56     Resp: 18 16 (!) 9 12  Temp: (!) 97.5 F (36.4 C)     TempSrc: Temporal     SpO2: 97% 96% 98% 97%  Weight: 224 lb (101.6 kg)     Height: 5' (1.524 m)       Start Time: 1134 hrs. End Time: 1137 hrs. Materials:  Tray: Epidural Needle(s) Type: Epidural needle Gauge: 22G Length: 3.5-in  6 cc solution made of 3 cc of preservative-free saline, 2 cc of 0.2% ropivacaine, 1 cc of Decadron 10 mg/cc.  Imaging Guidance (Spinal):          Type of Imaging Technique: Fluoroscopy Guidance (Spinal) Indication(s): Assistance in needle guidance and placement for procedures requiring needle placement in or near specific anatomical locations not easily accessible without such assistance. Exposure Time: Please see nurses notes. Contrast: Before injecting any contrast, we confirmed that the patient did not have an allergy to iodine, shellfish, or radiological contrast. Once satisfactory needle placement was completed at the desired level, radiological contrast was injected. Contrast injected under live fluoroscopy. No contrast complications. See chart for type and volume of contrast used. Fluoroscopic Guidance: I was personally present during the use of  fluoroscopy. "Tunnel Vision Technique" used to obtain the best possible view of the target area. Parallax error corrected before commencing the procedure. "Direction-depth-direction" technique used to introduce the needle under continuous pulsed fluoroscopy. Once target was reached, antero-posterior, oblique, and lateral fluoroscopic projection used confirm needle placement in all planes. Images permanently stored in EMR. Interpretation: I personally interpreted the imaging intraoperatively. Adequate needle placement confirmed in multiple planes. Appropriate spread of contrast into desired area was observed. No evidence of afferent or efferent intravascular uptake. No intrathecal or subarachnoid spread observed. Permanent images saved into the patient's record.   Post-operative Assessment:  Post-procedure Vital Signs:  Pulse/HCG Rate: (!) 56(!) 55 Temp: (!) 97.5 F (36.4 C) Resp: 12 BP:  113/63 SpO2: 97 %  EBL: None  Complications: No immediate post-treatment complications observed by team, or reported by patient.  Note: The patient tolerated the entire procedure well. A repeat set of vitals were taken after the procedure and the patient was kept under observation following institutional policy, for this type of procedure. Post-procedural neurological assessment was performed, showing return to baseline, prior to discharge. The patient was provided with post-procedure discharge instructions, including a section on how to identify potential problems. Should any problems arise concerning this procedure, the patient was given instructions to immediately contact us, at any time, without hesitation. In any case, we plan to contact the patient by telephone for a follow-up status report regarding this interventional procedure.  Comments:  No additional relevant information.     Plan of Care  Orders:  No orders of the defined types were placed in this encounter.   Medications ordered for  procedure: Meds ordered this encounter  Medications   iohexol (OMNIPAQUE) 180 MG/ML injection 10 mL    Must be Myelogram-compatible. If not available, you may substitute with a water-soluble, non-ionic, hypoallergenic, myelogram-compatible radiological contrast medium.   lidocaine (XYLOCAINE) 2 % (with pres) injection 400 mg   sodium chloride flush (NS) 0.9 % injection 2 mL   ropivacaine (PF) 2  mg/mL (0.2%) (NAROPIN) injection 2 mL   dexamethasone (DECADRON) injection 10 mg   Medications administered: Dion Saucier. Depolo "[name]" had no medications administered during this visit.  See the medical record for exact dosing, route, and time of administration.  Follow-up plan:   Return keep scheduled appt..     Recent Visits Date Type Provider Dept  02/10/23 Office Visit Edward Jolly, MD [facility]  Showing recent visits within past 90 days and meeting all other requirements Today's Visits Date Type Provider Dept  03/16/23 Procedure visit Edward Jolly, MD [facility]  Showing today's visits and meeting all other requirements Future Appointments Date Type Provider Dept  05/12/23 Appointment Edward Jolly, MD [facility]  Showing future appointments within next 90 days and meeting all other requirements  Disposition: Discharge home  Discharge (Date  Time): 03/16/2023; 1143 hrs.   Primary Care Physician: Wilford Corner, PA-C Location: [facility] Note by: Edward Jolly, MD Date: 03/16/2023; Time: 12:01 PM  Disclaimer:  Medicine is not an exact science. The only guarantee in medicine is that nothing is guaranteed. It is important to note that the decision to proceed with this intervention was based on the information collected from the patient. The Data and conclusions were drawn from the patient's questionnaire, the interview, and the physical examination. Because the information was provided in large part by the patient,  it cannot be guaranteed that it has not been purposely or unconsciously manipulated. Every effort has been made to obtain as much relevant data as possible for this evaluation. It is important to note that the conclusions that lead to this procedure are derived in large part from the available data. Always take into account that the treatment will also be dependent on availability of resources and existing treatment guidelines, considered by other Pain Management Practitioners as being common knowledge and practice, at the time of the intervention. For Medico-Legal purposes, it is also important to point out that variation in procedural techniques and pharmacological choices are the acceptable norm. The indications, contraindications, technique, and results of the above procedure should only be interpreted and judged by a Board-Certified Interventional Pain Specialist with extensive familiarity and expertise in the same exact procedure and technique.

## 2023-03-16 NOTE — Patient Instructions (Signed)

## 2023-05-07 ENCOUNTER — Other Ambulatory Visit: Payer: Self-pay | Admitting: Psychiatry

## 2023-05-07 DIAGNOSIS — F23 Brief psychotic disorder: Secondary | ICD-10-CM

## 2023-05-12 ENCOUNTER — Encounter: Payer: Self-pay | Admitting: Student in an Organized Health Care Education/Training Program

## 2023-05-12 ENCOUNTER — Ambulatory Visit
Payer: Medicare Other | Attending: Student in an Organized Health Care Education/Training Program | Admitting: Student in an Organized Health Care Education/Training Program

## 2023-05-12 VITALS — HR 57 | Temp 97.3°F | Resp 16 | Ht 60.0 in | Wt 218.0 lb

## 2023-05-12 DIAGNOSIS — G894 Chronic pain syndrome: Secondary | ICD-10-CM

## 2023-05-12 DIAGNOSIS — M5430 Sciatica, unspecified side: Secondary | ICD-10-CM

## 2023-05-12 DIAGNOSIS — M5416 Radiculopathy, lumbar region: Secondary | ICD-10-CM | POA: Diagnosis present

## 2023-05-12 DIAGNOSIS — M4807 Spinal stenosis, lumbosacral region: Secondary | ICD-10-CM

## 2023-05-12 MED ORDER — LEVORPHANOL TARTRATE 2 MG PO TABS: 2.0000 mg | ORAL_TABLET | Freq: Three times a day (TID) | ORAL | 0 refills | Status: AC | PRN

## 2023-05-12 MED ORDER — LEVORPHANOL TARTRATE 2 MG PO TABS
2.0000 mg | ORAL_TABLET | Freq: Three times a day (TID) | ORAL | 0 refills | Status: AC | PRN
Start: 2023-05-14 — End: 2023-06-13

## 2023-05-12 MED ORDER — LEVORPHANOL TARTRATE 2 MG PO TABS
2.0000 mg | ORAL_TABLET | Freq: Three times a day (TID) | ORAL | 0 refills | Status: AC | PRN
Start: 2023-07-13 — End: 2023-08-12

## 2023-05-12 NOTE — Progress Notes (Signed)
 Nursing Pain Medication Assessment:  Safety precautions to be maintained throughout the outpatient stay will include: orient to surroundings, keep bed in low position, maintain call bell within reach at all times, provide assistance with transfer out of bed and ambulation.  Medication Inspection Compliance: Pill count conducted under aseptic conditions, in front of the patient. Neither the pills nor the bottle was removed from the patient's sight at any time. Once count was completed pills were immediately returned to the patient in their original bottle.  Medication:  Levorphanol Pill/Patch Count:  27 of 90 pills remain Pill/Patch Appearance: Markings consistent with prescribed medication Bottle Appearance: Standard pharmacy container. Clearly labeled. Filled Date: 21 / 17 / 2024 Last Medication intake:  Today Safety precautions to be maintained throughout the outpatient stay will include: orient to surroundings, keep bed in low position, maintain call bell within reach at all times, provide assistance with transfer out of bed and ambulation.

## 2023-05-12 NOTE — Patient Instructions (Signed)
____________________________________________________________________________________________  General Risks and Possible Complications  Patient Responsibilities: It is important that you read this as it is part of your informed consent. It is our duty to inform you of the risks and possible complications associated with treatments offered to you. It is your responsibility as a patient to read this and to ask questions about anything that is not clear or that you believe was not covered in this document.  Patient's Rights: You have the right to refuse treatment. You also have the right to change your mind, even after initially having agreed to have the treatment done. However, under this last option, if you wait until the last second to change your mind, you may be charged for the materials used up to that point.  Introduction: Medicine is not an exact science. Everything in Medicine, including the lack of treatment(s), carries the potential for danger, harm, or loss (which is by definition: Risk). In Medicine, a complication is a secondary problem, condition, or disease that can aggravate an already existing one. All treatments carry the risk of possible complications. The fact that a side effects or complications occurs, does not imply that the treatment was conducted incorrectly. It must be clearly understood that these can happen even when everything is done following the highest safety standards.  No treatment: You can choose not to proceed with the proposed treatment alternative. The "PRO(s)" would include: avoiding the risk of complications associated with the therapy. The "CON(s)" would include: not getting any of the treatment benefits. These benefits fall under one of three categories: diagnostic; therapeutic; and/or palliative. Diagnostic benefits include: getting information which can ultimately lead to improvement of the disease or symptom(s). Therapeutic benefits are those associated with the  successful treatment of the disease. Finally, palliative benefits are those related to the decrease of the primary symptoms, without necessarily curing the condition (example: decreasing the pain from a flare-up of a chronic condition, such as incurable terminal cancer).  General Risks and Complications: These are associated to most interventional treatments. They can occur alone, or in combination. They fall under one of the following six (6) categories: no benefit or worsening of symptoms; bleeding; infection; nerve damage; allergic reactions; and/or death. No benefits or worsening of symptoms: In Medicine there are no guarantees, only probabilities. No healthcare provider can ever guarantee that a medical treatment will work, they can only state the probability that it may. Furthermore, there is always the possibility that the condition may worsen, either directly, or indirectly, as a consequence of the treatment. Bleeding: This is more common if the patient is taking a blood thinner, either prescription or over the counter (example: Goody Powders, Fish oil, Aspirin, Garlic, etc.), or if suffering a condition associated with impaired coagulation (example: Hemophilia, cirrhosis of the liver, low platelet counts, etc.). However, even if you do not have one on these, it can still happen. If you have any of these conditions, or take one of these drugs, make sure to notify your treating physician. Infection: This is more common in patients with a compromised immune system, either due to disease (example: diabetes, cancer, human immunodeficiency virus [HIV], etc.), or due to medications or treatments (example: therapies used to treat cancer and rheumatological diseases). However, even if you do not have one on these, it can still happen. If you have any of these conditions, or take one of these drugs, make sure to notify your treating physician. Nerve Damage: This is more common when the treatment is an invasive    one, but it can also happen with the use of medications, such as those used in the treatment of cancer. The damage can occur to small secondary nerves, or to large primary ones, such as those in the spinal cord and brain. This damage may be temporary or permanent and it may lead to impairments that can range from temporary numbness to permanent paralysis and/or brain death. Allergic Reactions: Any time a substance or material comes in contact with our body, there is the possibility of an allergic reaction. These can range from a mild skin rash (contact dermatitis) to a severe systemic reaction (anaphylactic reaction), which can result in death. Death: In general, any medical intervention can result in death, most of the time due to an unforeseen complication. ____________________________________________________________________________________________ Selective Nerve Root Block Patient Information  Description: Specific nerve roots exit the spinal canal and these nerves can be compressed and inflamed by a bulging disc and bone spurs.  By injecting steroids on the nerve root, we can potentially decrease the inflammation surrounding these nerves, which often leads to decreased pain.  Also, by injecting local anesthesia on the nerve root, this can provide [place] helpful information to give to your referring doctor if it decreases your pain.  Selective nerve root blocks can be done along the spine from the neck to the low back depending on the location of your pain.   After numbing the skin with local anesthesia, a small needle is passed to the nerve root and the position of the needle is verified using x-ray pictures.  After the needle is in correct position, we then deposit the medication.  You may experience a pressure sensation while this is being done.  The entire block usually lasts less than 15 minutes.  Conditions that may be treated with selective nerve root blocks: Low back and leg pain Spinal  stenosis Diagnostic block prior to potential surgery Neck and arm pain Post laminectomy syndrome  Preparation for the injection:  Do not eat any solid food or dairy products within 8 hours of your appointment. You may drink clear liquids up to 3 hours before an appointment.  Clear liquids include water, black coffee, juice or soda.  No milk or cream please. You may take your regular medications, including pain medications, with a sip of water before your appointment.  Diabetics should hold regular insulin (if taken separately) and take 1/2 normal NPH dose the morning of the procedure.  Carry some sugar containing items with you to your appointment. A driver must accompany you and be prepared to drive you home after your procedure. Bring all your current medications with you. An IV may be inserted and sedation may be given at the discretion of the physician. A blood pressure cuff, EKG, and other monitors will often be applied during the procedure.  Some patients may need to have extra oxygen administered for a short period. You will be asked to provide medical information, including allergies, prior to the procedure.  We must know immediately if you are taking blood  Thinners (like Coumadin) or if you are allergic to IV iodine contrast (dye).  Possible side-effects: All are usually temporary Bleeding from needle site Light headedness Numbness and tingling Decreased blood pressure Weakness in arms/legs Pressure sensation in back/neck Pain at injection site (several days)  Possible complications: All are extremely rare Infection Nerve injury Spinal headache (a headache wore with upright position)  Call if you experience: Fever/chills associated with headache or increased back/neck pain Headache worsened by  an upright position New onset weakness or numbness of an extremity below the injection site Hives or difficulty breathing (go to the emergency room) Inflammation or drainage at  the injection site(s) Severe back/neck pain greater than usual New symptoms which are concerning to you  Please note:  Although the local anesthetic injected can often make your back or neck feel good for several hours after the injection the pain will likely return.  It takes 3-5 days for steroids to work on the nerve root. You may not notice any pain relief for at least one week.  If effective, we will often do a series of 3 injections spaced 3-6 weeks apart to maximally decrease your pain.    If you have any questions, please call [phone] [facility]

## 2023-05-12 NOTE — Progress Notes (Signed)
 PROVIDER NOTE: Information contained herein reflects review and annotations entered in association with encounter. Interpretation of such information and data should be left to medically-trained personnel. Information provided to patient can be located elsewhere in the medical record under Patient Instructions. Document created using STT-dictation technology, any transcriptional errors that may result from process are unintentional.    Patient: Connie [name]  Service Category: E/M  Provider: Wallie Sherry, MD  DOB: [birth year]  DOS: 05/12/2023  Referring Provider: Cyrus Connie [name] Connie [name]  MRN: [id number]  Specialty: Interventional Pain Management  PCP: Connie Connie Azalee, PA-C  Type: Established Patient  Setting: Ambulatory outpatient    Location: Office  Delivery: Face-to-face     HPI  [name], a 68 y.o. year old female, is here today because of her Sciatic leg pain [M54.30]. [name] primary complain today is Back Pain (lower)  Pertinent problems: [name] has Degenerative joint disease of cervical and lumbar spine; Lumbar spondylosis; History of lumbar fusion; Lumbar degenerative disc disease; Fibromyalgia; Chronic pain syndrome; Spinal cord stimulator status; Acquired spondylolisthesis; Chronic pain; Generalized osteoarthritis of multiple sites; Spinal stenosis of lumbar region without neurogenic claudication; and Lumbar post-laminectomy syndrome on their pertinent problem list. Pain Assessment: Severity of Chronic pain is reported as a 3 /10. Location: Back Lower/left leg to the bottom of the foot. Onset: More than a month ago. Quality: Aching. Timing: Constant. Modifying factor(s): Levorphanol. Vitals:  height is 5' (1.524 m) and weight is 218 lb (98.9 kg). Her temporal temperature is 97.3 F (36.3 C) (abnormal). Her pulse is 57 (abnormal). Her respiration is 16 and oxygen saturation is 96%.  BMI: Estimated body mass index is 42.58 kg/m as calculated from the  following:   Height as of this encounter: 5' (1.524 m).   Weight as of this encounter: 218 lb (98.9 kg). Last encounter: 02/10/2023. Last procedure: 03/16/2023.  Reason for encounter: medication management. Left below the knee leg pain, Has SCS in place but it isn't covering this painful area. Discussed left sciatic popliteal nerve block. Otherwise, continues Levorphanol as prescribed, no change in dose Having increased leg cramps, discussed hydration and po Magnesium   Pharmacotherapy Assessment  Analgesic: Levorphanol 2 mg BID  prn, #90/month    Monitoring: Connie [name] PMP: PDMP reviewed during this encounter.       Pharmacotherapy: No side-effects or adverse reactions reported. Compliance: No problems identified. Effectiveness: Clinically acceptable.  [name], RN  05/12/2023  2:10 PM  Sign when Signing Visit Nursing Pain Medication Assessment:  Safety precautions to be maintained throughout the outpatient stay will include: orient to surroundings, keep bed in low position, maintain call bell within reach at all times, provide assistance with transfer out of bed and ambulation.  Medication Inspection Compliance: Pill count conducted under aseptic conditions, in front of the patient. Neither the pills nor the bottle was removed from the patient's sight at any time. Once count was completed pills were immediately returned to the patient in their original bottle.  Medication:  Levorphanol Pill/Patch Count:  27 of 90 pills remain Pill/Patch Appearance: Markings consistent with prescribed medication Bottle Appearance: Standard pharmacy container. Clearly labeled. Filled Date: 57 / 17 / 2024 Last Medication intake:  Today Safety precautions to be maintained throughout the outpatient stay will include: orient to surroundings, keep bed in low position, maintain call bell within reach at all times, provide assistance with transfer out of bed and ambulation.   No results found for:  CBDTHCR No results found for: D8THCCBX  No results found for: D9THCCBX  UDS:  Summary  Date Value Ref Range Status  06/05/2022 Note  Final    Comment:    ==================================================================== [employer] Select 13 (MW) ==================================================================== Test                             Result       Flag       Units    NO DRUGS DETECTED. ==================================================================== Test                      Result    Flag   Units      Ref Range   Creatinine              167              mg/dL      >=79 ==================================================================== Declared Medications:  The flagging and interpretation on this report are based on the  following declared medications.  Unexpected results may arise from  inaccuracies in the declared medications.   **Note: The testing scope of this panel does not include the  following reported medications:   Bupropion  (Wellbutrin  XL)  Celecoxib  (Celebrex )  Furosemide  Levorphanol  Levothyroxine  Metoprolol  Omeprazole  Pregabalin (Lyrica)  Quetiapine  (Seroquel )  Trazodone  ==================================================================== For clinical consultation, please call [phone]. ====================================================================       ROS  Constitutional: Denies any fever or chills Gastrointestinal: No reported hemesis, hematochezia, vomiting, or acute GI distress Musculoskeletal:  left leg pain Neurological: No reported episodes of acute onset apraxia, aphasia, dysarthria, agnosia, amnesia, paralysis, loss of coordination, or loss of consciousness  Medication Review  Cyanocobalamin, QUEtiapine , azelastine, buPROPion , celecoxib , fluticasone, furosemide, levorphanol, levothyroxine, metoprolol succinate, omeprazole, pregabalin, and traZODone   History Review  Allergy: Ms. [name] has no known  allergies. Drug: Ms. [name]  reports no history of drug use. Alcohol:  reports no history of alcohol use. Tobacco:  reports that she quit smoking about 15 years ago. Her smoking use included cigarettes. She started smoking about 36 years ago. She has a 10 pack-year smoking history. She has never used smokeless tobacco. Social: Ms. Morash  reports that she quit smoking about 15 years ago. Her smoking use included cigarettes. She started smoking about 36 years ago. She has a 10 pack-year smoking history. She has never used smokeless tobacco. She reports that she does not drink alcohol and does not use drugs. Medical:  has a past medical history of Anemia, Anxiety, Arthritis, Cervical spondylosis with myelopathy (2018), Chronic pain (2019), DDD (degenerative disc disease), lumbar, Depression, Displacement of cervical intervertebral disc (2018), Dyspnea, Fibromyalgia, GERD (gastroesophageal reflux disease), Heart murmur, Hypertension, Hypothyroidism, Lumbar post-laminectomy syndrome (2018), Lumbosacral radiculitis (2018), Neuropathy due to medical condition (HCC), Osteoarthritis, Osteoporosis, Other long term (current) drug therapy (2019), Pain in the coccyx (2018), Sleep apnea, Spondylolisthesis (2018), Spondylosis of lumbosacral region without myelopathy or radiculopathy (2018), Spondylosis with myelopathy, lumbar region (2018), and Vitamin D deficiency. Surgical: Ms. Delgrande  has a past surgical history that includes Cesarean section; Gastric bypass (2004); Lumbar fusion (2016); Spinal cord stimulator implant (2018); Cholecystectomy; Appendectomy; Tonsillectomy; Shoulder arthroscopy with rotator cuff repair (Right, 2008); Joint replacement (Bilateral, 2005); Abdominal hysterectomy (2000); epidural injection (2018); arm surgery (2002); Breast biopsy (Left, 2005); Colonoscopy with propofol  (N/A, 08/25/2017); Temporal artery biopsy / ligation; Cataract extraction w/PHACO (Right, 10/21/2017); and Colonoscopy with  propofol  (N/A, 02/25/2022). Family: family history includes Anxiety disorder in her mother;  Breast cancer in her cousin, maternal aunt, and mother; Cancer in her brother, father, and mother; Depression in her mother; Hypertension in her father; Kidney disease in her mother.  Laboratory Chemistry Profile   Renal Lab Results  Component Value Date   BUN 18 06/23/2017   CREATININE 0.60 06/23/2017   GFRAA >60 06/23/2017   GFRNONAA >60 06/23/2017    Hepatic Lab Results  Component Value Date   AST 20 06/23/2017   ALT 9 (L) 06/23/2017   ALBUMIN 3.6 06/23/2017   ALKPHOS 88 06/23/2017    Electrolytes Lab Results  Component Value Date   NA 135 06/23/2017   K 4.9 06/23/2017   CL 102 06/23/2017   CALCIUM 8.5 (L) 06/23/2017    Bone No results found for: VD25OH, VD125OH2TOT, CI6874NY7, CI7874NY7, 25OHVITD1, 25OHVITD2, 25OHVITD3, TESTOFREE, TESTOSTERONE  Inflammation (CRP: Acute Phase) (ESR: Chronic Phase) No results found for: CRP, ESRSEDRATE, LATICACIDVEN       Note: Above Lab results reviewed.  Recent Imaging Review  DG PAIN CLINIC C-ARM 1-60 MIN NO REPORT Fluoro was used, but no Radiologist interpretation will be provided.  Please refer to NOTES tab for provider progress note. Note: Reviewed        Physical Exam  General appearance: Well nourished, well developed, and well hydrated. In no apparent acute distress Mental status: Alert, oriented x 3 (person, place, & time)       Respiratory: No evidence of acute respiratory distress Eyes: PERLA Vitals: Pulse (!) 57   Temp (!) 97.3 F (36.3 C) (Temporal)   Resp 16   Ht 5' (1.524 m)   Wt 218 lb (98.9 kg)   SpO2 96%   BMI 42.58 kg/m  BMI: Estimated body mass index is 42.58 kg/m as calculated from the following:   Height as of this encounter: 5' (1.524 m).   Weight as of this encounter: 218 lb (98.9 kg). Ideal: Ideal body weight: 45.5 kg (100 lb 4.9 oz) Adjusted ideal body weight: 66.9 kg (147 lb 6.2  oz)  Assessment   Diagnosis Status  1. Sciatic leg pain   2. Lumbar radiculopathy   3. Spinal stenosis of lumbosacral region   4. Chronic pain syndrome    Having a Flare-up Controlled Controlled   Updated Problems: No problems updated.  Plan of Care    Ms. Dashonna Chagnon Zarzycki has a current medication list which includes the following long-term medication(s): bupropion , levothyroxine, metoprolol succinate, omeprazole, pregabalin, quetiapine , and trazodone .  Pharmacotherapy (Medications Ordered): Meds ordered this encounter  Medications   levorphanol (LEVODROMORAN) 2 MG tablet    Sig: Take 1 tablet (2 mg total) by mouth every 8 (eight) hours as needed for pain.    Dispense:  90 tablet    Refill:  0   levorphanol (LEVODROMORAN) 2 MG tablet    Sig: Take 1 tablet (2 mg total) by mouth every 8 (eight) hours as needed for pain.    Dispense:  90 tablet    Refill:  0   levorphanol (LEVODROMORAN) 2 MG tablet    Sig: Take 1 tablet (2 mg total) by mouth every 8 (eight) hours as needed for pain.    Dispense:  90 tablet    Refill:  0   Orders:  Orders Placed This Encounter  Procedures   SCIATIC NERVE BLOCK    Standing Status:   Future    Expected Date:   06/12/2023    Expiration Date:   08/10/2023    Scheduling Instructions:  Side: LEFT     Sedation: without     Timeframe: in 4 weeks    Where will this procedure be performed?:   [facility]   Follow-up plan:   Return in about 6 weeks (around 06/24/2023) for Left popliteal sciatic NB, in clinic NS (US ).      Recent Visits Date Type Provider Dept  03/16/23 Procedure visit Marcelino Nurse, MD [facility]  Showing recent visits within past 90 days and meeting all other requirements Today's Visits Date Type Provider Dept  05/12/23 Office Visit Marcelino Nurse, MD [facility]  Showing today's visits and meeting all other requirements Future Appointments Date Type Provider Dept  08/04/23 Appointment  Marcelino Nurse, MD [facility]  Showing future appointments within next 90 days and meeting all other requirements  I discussed the assessment and treatment plan with the patient. The patient was provided an opportunity to ask questions and all were answered. The patient agreed with the plan and demonstrated an understanding of the instructions.  Patient advised to call back or seek an in-person evaluation if the symptoms or condition worsens.  Duration of encounter: 30 minutes.  Total time on encounter, as per AMA guidelines included both the face-to-face and non-face-to-face time personally spent by the physician and/or other qualified health care professional(s) on the day of the encounter (includes time in activities that require the physician or other qualified health care professional and does not include time in activities normally performed by clinical staff). Physician's time may include the following activities when performed: Preparing to see the patient (e.g., pre-charting review of records, searching for previously ordered imaging, lab work, and nerve conduction tests) Review of prior analgesic pharmacotherapies. Reviewing PMP Interpreting ordered tests (e.g., lab work, imaging, nerve conduction tests) Performing post-procedure evaluations, including interpretation of diagnostic procedures Obtaining and/or reviewing separately obtained history Performing a medically appropriate examination and/or evaluation Counseling and educating the patient/family/caregiver Ordering medications, tests, or procedures Referring and communicating with other health care professionals (when not separately reported) Documenting clinical information in the electronic or other health record Independently interpreting results (not separately reported) and communicating results to the patient/ family/caregiver Care coordination (not separately reported)  Note by: Nurse Marcelino, MD Date:  05/12/2023; Time: 2:48 PM

## 2023-05-22 ENCOUNTER — Other Ambulatory Visit: Payer: Self-pay | Admitting: Student

## 2023-05-22 DIAGNOSIS — R531 Weakness: Secondary | ICD-10-CM

## 2023-05-22 DIAGNOSIS — G5603 Carpal tunnel syndrome, bilateral upper limbs: Secondary | ICD-10-CM

## 2023-06-03 ENCOUNTER — Ambulatory Visit: Payer: Medicare Other | Attending: Student

## 2023-06-11 ENCOUNTER — Telehealth: Payer: Medicare Other | Admitting: Psychiatry

## 2023-06-11 ENCOUNTER — Encounter: Payer: Self-pay | Admitting: Psychiatry

## 2023-06-11 DIAGNOSIS — F3342 Major depressive disorder, recurrent, in full remission: Secondary | ICD-10-CM

## 2023-06-11 DIAGNOSIS — G2581 Restless legs syndrome: Secondary | ICD-10-CM

## 2023-06-11 DIAGNOSIS — F411 Generalized anxiety disorder: Secondary | ICD-10-CM | POA: Diagnosis not present

## 2023-06-11 DIAGNOSIS — F23 Brief psychotic disorder: Secondary | ICD-10-CM

## 2023-06-11 DIAGNOSIS — G4701 Insomnia due to medical condition: Secondary | ICD-10-CM | POA: Diagnosis not present

## 2023-06-11 NOTE — Progress Notes (Unsigned)
Virtual Visit via Video Note  I connected with Connie [name] on 06/11/23 at  1:00 PM EST by a video enabled telemedicine application and verified that I am speaking with the correct person using two identifiers.  Location Provider Location : [facility] Patient Location : Home  Participants: Patient , Provider   I discussed the limitations of evaluation and management by telemedicine and the availability of in person appointments. The patient expressed understanding and agreed to proceed.   I discussed the assessment and treatment plan with the patient. The patient was provided an opportunity to ask questions and all were answered. The patient agreed with the plan and demonstrated an understanding of the instructions.   The patient was advised to call back or seek an in-person evaluation if the symptoms worsen or if the condition fails to improve as anticipated.  BH MD OP Progress Note  06/12/2023 8:06 AM [name] Oran  MRN:  [id number]  Chief Complaint:  Chief Complaint  Patient presents with   Follow-up   Anxiety   Depression   Medication Refill   HPI: Connie [name] is a 68 year old Caucasian female, married, lives in [place], has a history of MDD, GAD, brief psychotic disorder, insomnia, obstructive sleep apnea currently not on CPAP, fibromyalgia, hypothyroidism, polyarthralgia was evaluated by telemedicine today.  She experiences ongoing sleep disturbances characterized by frequent awakenings throughout the night. She falls asleep quickly after taking her nighttime medication but wakes up every couple of hours, resulting in fragmented sleep. She does not take naps during the day.  She has a history of sleep apnea and previously used a CPAP machine. However, she experienced difficulties with the mask fitting properly, which led to the insurance company reclaiming the device due to inconsistent use last year. She is not currently using any CPAP device.  She is currently taking  Seroquel, which initially helped with sleep but has since become ineffective. She had been taking it every other night but resumed regular use due to sleep issues.   Denies thoughts of self-harm, hopelessness, or lack of motivation.  Denies any other depression symptoms.  Denies any psychosis.  Denies any concerns with current medication regimen.   Visit Diagnosis:    ICD-10-CM   1. MDD (major depressive disorder), recurrent, in full remission (HCC)  F33.42     2. GAD (generalized anxiety disorder)  F41.1     3. Brief psychotic disorder (HCC)  F23     4. Insomnia due to medical condition  G47.01 Ambulatory referral to Pulmonology   Pain, RLS, uncorrected sleep apnea      Past Psychiatric History: I have reviewed past psychiatric history from progress note on 08/06/2017.  Past trials of Latuda, Wellbutrin, Cymbalta.  Past Medical History:  Past Medical History:  Diagnosis Date   Anemia    in the past   Anxiety    Arthritis    all over   Cervical spondylosis with myelopathy 2018   Chronic pain 2019   can't stand straight, uses walker for stability   DDD (degenerative disc disease), lumbar    Depression    Displacement of cervical intervertebral disc 2018   Dyspnea    HANDICAPPED   Fibromyalgia    Polymyalgia Rheumatica   GERD (gastroesophageal reflux disease)    Heart murmur    not treated or being followed   Hypertension    CONTROLLED   Hypothyroidism    Lumbar post-laminectomy syndrome 2018   Lumbosacral radiculitis 2018   Neuropathy due  to medical condition (HCC)    feet, toes, fingers   Osteoarthritis    Osteoporosis    Other long term (current) drug therapy 2019   from pain management   Pain in the coccyx 2018   disorder of sacrum   Sleep apnea    Spondylolisthesis 2018   Spondylosis of lumbosacral region without myelopathy or radiculopathy 2018   Spondylosis with myelopathy, lumbar region 2018   Vitamin D deficiency     Past Surgical History:  Procedure  Laterality Date   ABDOMINAL HYSTERECTOMY  2000   APPENDECTOMY     arm surgery  2002   BREAST BIOPSY Left 2005   benign   CATARACT EXTRACTION W/PHACO Right 10/21/2017   Procedure: CATARACT EXTRACTION PHACO AND INTRAOCULAR LENS PLACEMENT (IOC) RIGHT;  Surgeon: Lockie Mola, MD;  Location: [facility];  Service: Ophthalmology;  Laterality: Right;  neck issues   CESAREAN SECTION     x 2   CHOLECYSTECTOMY     COLONOSCOPY WITH PROPOFOL N/A 08/25/2017   Procedure: COLONOSCOPY WITH PROPOFOL;  Surgeon: Toledo, Boykin Nearing, MD;  Location: [facility];  Service: Gastroenterology;  Laterality: N/A;   COLONOSCOPY WITH PROPOFOL N/A 02/25/2022   Procedure: COLONOSCOPY WITH PROPOFOL;  Surgeon: Regis Bill, MD;  Location: [facility];  Service: Endoscopy;  Laterality: N/A;   epidural injection  2018   steroids   GASTRIC BYPASS  2004   JOINT REPLACEMENT Bilateral 2005   knees   LUMBAR FUSION  2016   x 2/ L3-5   SHOULDER ARTHROSCOPY WITH ROTATOR CUFF REPAIR Right 2008   x 3   SPINAL CORD STIMULATOR IMPLANT  2018   [employer]   TEMPORAL ARTERY BIOPSY / LIGATION     TONSILLECTOMY      Family Psychiatric History: I have reviewed family psychiatric history from progress note on 08/06/2017.  Family History:  Family History  Problem Relation Age of Onset   Kidney disease Mother    Cancer Mother    Anxiety disorder Mother    Depression Mother    Breast cancer Mother    Cancer Father    Hypertension Father    Cancer Brother    Breast cancer Maternal Aunt    Breast cancer Cousin     Social History: I have reviewed social history from progress note on 08/06/2017. Social History   Socioeconomic History   Marital status: Married    Spouse name: brady    Number of children: 2   Years of education: Not on file   Highest education level: High school graduate  Occupational History    Comment: disabled   Tobacco Use   Smoking status: Former    Current packs/day:  0.00    Average packs/day: 0.5 packs/day for 20.0 years (10.0 ttl pk-yrs)    Types: Cigarettes    Start date: 05/21/1987    Quit date: 05/21/2007    Years since quitting: 16.0   Smokeless tobacco: Never  Vaping Use   Vaping status: Never Used  Substance and Sexual Activity   Alcohol use: No   Drug use: No    Comment: managed by pain clinic   Sexual activity: Not Currently  Other Topics Concern   Not on file  Social History Narrative   Not on file   Social Drivers of Health   Financial Resource Strain: Medium Risk (05/20/2017)   Overall Financial Resource Strain (CARDIA)    Difficulty of Paying Living Expenses: Somewhat hard  Food Insecurity: No Food Insecurity (  05/20/2017)   Hunger Vital Sign    Worried About Running Out of Food in the Last Year: Never true    Ran Out of Food in the Last Year: Never true  Transportation Needs: No Transportation Needs (05/20/2017)   PRAPARE - Administrator, Civil Service (Medical): No    Lack of Transportation (Non-Medical): No  Physical Activity: Inactive (05/20/2017)   Exercise Vital Sign    Days of Exercise per Week: 0 days    Minutes of Exercise per Session: 0 min  Stress: Not on file  Social Connections: Moderately Integrated (05/20/2017)   Social Connection and Isolation Panel [NHANES]    Frequency of Communication with Friends and Family: Three times a week    Frequency of Social Gatherings with Friends and Family: Once a week    Attends Religious Services: More than 4 times per year    Active Member of [employer] or Organizations: No    Attends Engineer, structural: Never    Marital Status: Married    Allergies: No Known Allergies  Metabolic Disorder Labs: No results found for: "HGBA1C", "MPG" No results found for: "PROLACTIN" No results found for: "CHOL", "TRIG", "HDL", "CHOLHDL", "VLDL", "LDLCALC" No results found for: "TSH"  Therapeutic Level Labs: No results found for: "LITHIUM" No results found for:  "VALPROATE" No results found for: "CBMZ"  Current Medications: Current Outpatient Medications  Medication Sig Dispense Refill   azelastine (ASTELIN) 0.1 % nasal spray Place into the nose.     buPROPion (WELLBUTRIN XL) 150 MG 24 hr tablet Take 1 tablet (150 mg total) by mouth daily. 90 tablet 3   celecoxib (CELEBREX) 100 MG capsule Take by mouth.     Cyanocobalamin (VITAMIN B-12 IJ) Inject as directed. Once a month     fluticasone (FLONASE) 50 MCG/ACT nasal spray Place into the nose.     furosemide (LASIX) 40 MG tablet Take by mouth.     levorphanol (LEVODROMORAN) 2 MG tablet Take 1 tablet (2 mg total) by mouth every 8 (eight) hours as needed for pain. 90 tablet 0   [START ON 06/13/2023] levorphanol (LEVODROMORAN) 2 MG tablet Take 1 tablet (2 mg total) by mouth every 8 (eight) hours as needed for pain. 90 tablet 0   [START ON 07/13/2023] levorphanol (LEVODROMORAN) 2 MG tablet Take 1 tablet (2 mg total) by mouth every 8 (eight) hours as needed for pain. 90 tablet 0   levothyroxine (SYNTHROID) 50 MCG tablet      metoprolol succinate (TOPROL-XL) 100 MG 24 hr tablet Take 100 mg by mouth once daily in the evening  0   omeprazole (PRILOSEC) 20 MG capsule Take 20 mg by mouth daily.     pregabalin (LYRICA) 150 MG capsule Take 150 mg by mouth 3 (three) times daily.     QUEtiapine (SEROQUEL) 50 MG tablet TAKE 1 TABLET(50 MG) BY MOUTH AT BEDTIME AS NEEDED 90 tablet 0   traZODone (DESYREL) 50 MG tablet TAKE 1 TO 2 TABLETS(50 TO 100 MG) BY MOUTH AT BEDTIME AS NEEDED FOR SLEEP 180 tablet 2   No current facility-administered medications for this visit.     Musculoskeletal: Strength & Muscle Tone:  UTA Gait & Station:  Seated Patient leans: N/A  Psychiatric Specialty Exam: Review of Systems  Psychiatric/Behavioral:  Positive for sleep disturbance.     There were no vitals taken for this visit.There is no height or weight on file to calculate BMI.  General Appearance: Casual  Eye Contact:  Fair  Speech:  Clear and Coherent  Volume:  Normal  Mood:  Euthymic  Affect:  Appropriate  Thought Process:  Goal Directed and Descriptions of Associations: Intact  Orientation:  Full (Time, Place, and Person)  Thought Content: Logical   Suicidal Thoughts:  No  Homicidal Thoughts:  No  Memory:  Immediate;   Fair Recent;   Fair Remote;   Fair  Judgement:  Fair  Insight:  Fair  Psychomotor Activity:  Normal  Concentration:  Concentration: Fair and Attention Span: Fair  Recall:  [employer] of Knowledge: Fair  Language: Fair  Akathisia:  No  Handed:  Right  AIMS (if indicated): not done  Assets:  Communication Skills Desire for Improvement Housing Social Support  ADL's:  Intact  Cognition: WNL  Sleep:  Poor   Screenings: Geneticist, molecular Office Visit from 09/16/2022 in [facility] Office Visit from 06/17/2022 in [facility] Office Visit from 04/16/2022 in [facility] Office Visit from 02/12/2022 in [facility] Office Visit from 01/16/2022 in [facility]  AIMS Total Score 0 0 0 0 0      GAD-7    Flowsheet Row Office Visit from 11/18/2022 in [facility] Office Visit from 09/16/2022 in [facility] Office Visit from 06/17/2022 in [facility] Office Visit from 04/16/2022 in [facility] Office Visit from 02/12/2022 in [facility]  Total GAD-7 Score 0 0 0 2 3      PHQ2-9    Flowsheet Row Video Visit from 06/11/2023 in [facility] Office Visit from 05/12/2023 in [facility] Procedure  visit from 03/16/2023 in [facility] Visit from 02/10/2023 in [facility] Suite Visit from 11/18/2022 in [facility]  PHQ-2 Total Score 0 0 0 0 0  PHQ-9 Total Score -- -- -- -- 2      Flowsheet Row Video Visit from 06/11/2023 in [facility] Video Visit from 01/12/2023 in [facility] Office Visit from 11/18/2022 in [facility]  C-SSRS RISK CATEGORY No Risk No Risk No Risk        Assessment and Plan: Ylonda Storr is a 68 year old Caucasian female with history of MDD, GAD, chronic pain, hypothyroidism was evaluated by telemedicine today.  Patient is currently struggling with sleep, discussed assessment and plan as noted below.  Insomnia-unstable Chronic difficulty with sleep characterized by frequent awakenings. Falls asleep quickly but wakes up multiple times. Currently not using CPAP due to fitting issues and insurance constraints. Seroquel initially effective but now ineffective. Discussed risks of sedative medications due to potential respiratory issues from apneic episodes. - Refer to pulmonologist for sleep consultation and CPAP re-evaluation - Advise intermittent use of Seroquel 50 mg at bedtime due to potential long-term risks - Continue Trazodone 50-100 mg at bedtime as needed for now.  MDD in remission Anxiety disorder in remission Currently denies any significant depression symptoms.  Well-managed on current medication regimen. - Continue Wellbutrin XL 150 mg daily-reduced dosage - Continue Seroquel 50 mg at bedtime as needed.  Long-term plan to taper off  this medication due to long-term risk.  History of brief psychotic disorder-resolved Currently denies any perceptual  disturbances. - Will monitor closely.  Patient does have Seroquel available.  Follow-up - Schedule follow-up appointment in three months (May 13th at 1:30 PM) to review sleep study results and pulmonologist consultation outcomes.  Collaboration of Care: Collaboration of Care: Other I have referred this patient to a pulmonologist, patient advised to schedule an appointment.  Patient/Guardian was advised Release of Information must be obtained prior to any record release in order to collaborate their care with an outside provider. Patient/Guardian was advised if they have not already done so to contact the registration department to sign all necessary forms in order for [place] to release information regarding their care.   Consent: Patient/Guardian gives verbal consent for treatment and assignment of benefits for services provided during this visit. Patient/Guardian expressed understanding and agreed to proceed.   This note was generated in part or whole with voice recognition software. Voice recognition is usually quite accurate but there are transcription errors that can and very often do occur. I apologize for any typographical errors that were not detected and corrected.    Jomarie Longs, MD 06/12/2023, 8:06 AM

## 2023-06-14 ENCOUNTER — Encounter: Payer: Self-pay | Admitting: Student in an Organized Health Care Education/Training Program

## 2023-06-19 ENCOUNTER — Encounter: Payer: Self-pay | Admitting: Sleep Medicine

## 2023-06-22 ENCOUNTER — Ambulatory Visit: Payer: Medicare Other | Admitting: Student in an Organized Health Care Education/Training Program

## 2023-06-24 ENCOUNTER — Ambulatory Visit: Payer: Medicare Other | Attending: Student

## 2023-06-24 DIAGNOSIS — R2681 Unsteadiness on feet: Secondary | ICD-10-CM | POA: Insufficient documentation

## 2023-06-24 DIAGNOSIS — M6281 Muscle weakness (generalized): Secondary | ICD-10-CM | POA: Diagnosis present

## 2023-06-24 DIAGNOSIS — R2689 Other abnormalities of gait and mobility: Secondary | ICD-10-CM | POA: Insufficient documentation

## 2023-06-24 NOTE — Therapy (Unsigned)
 OUTPATIENT PHYSICAL THERAPY BALANCE EVALUATION  Patient Name: Connie [name] MRN: [id number] DOB:1956-02-04, 68 y.o., female Today's Date: 06/25/2023  END OF SESSION:  PT End of Session - 06/24/23 1142     Visit Number 1    Number of Visits 25    Date for PT Re-Evaluation 09/16/23    Authorization Type eval: 06/24/23    PT Start Time 1145    PT Stop Time 1230    PT Time Calculation (min) 45 min    Equipment Utilized During Treatment Gait belt    Activity Tolerance Patient tolerated treatment well    Behavior During Therapy WFL for tasks assessed/performed             Past Medical History:  Diagnosis Date   Anemia    in the past   Anxiety    Arthritis    all over   Cervical spondylosis with myelopathy 2018   Chronic pain 2019   can't stand straight, uses walker for stability   DDD (degenerative disc disease), lumbar    Depression    Displacement of cervical intervertebral disc 2018   Dyspnea    HANDICAPPED   Fibromyalgia    Polymyalgia Rheumatica   GERD (gastroesophageal reflux disease)    Heart murmur    not treated or being followed   Hypertension    CONTROLLED   Hypothyroidism    Lumbar post-laminectomy syndrome 2018   Lumbosacral radiculitis 2018   Neuropathy due to medical condition (HCC)    feet, toes, fingers   Osteoarthritis    Osteoporosis    Other long term (current) drug therapy 2019   from pain management   Pain in the coccyx 2018   disorder of sacrum   Sleep apnea    Spondylolisthesis 2018   Spondylosis of lumbosacral region without myelopathy or radiculopathy 2018   Spondylosis with myelopathy, lumbar region 2018   Vitamin D deficiency    Past Surgical History:  Procedure Laterality Date   ABDOMINAL HYSTERECTOMY  2000   APPENDECTOMY     arm surgery  2002   BREAST BIOPSY Left 2005   benign   CATARACT EXTRACTION W/PHACO Right 10/21/2017   Procedure: CATARACT EXTRACTION PHACO AND INTRAOCULAR LENS PLACEMENT (IOC) RIGHT;  Surgeon:  Lockie Mola, MD;  Location: [facility];  Service: Ophthalmology;  Laterality: Right;  neck issues   CESAREAN SECTION     x 2   CHOLECYSTECTOMY     COLONOSCOPY WITH PROPOFOL N/A 08/25/2017   Procedure: COLONOSCOPY WITH PROPOFOL;  Surgeon: Toledo, Boykin Nearing, MD;  Location: [facility];  Service: Gastroenterology;  Laterality: N/A;   COLONOSCOPY WITH PROPOFOL N/A 02/25/2022   Procedure: COLONOSCOPY WITH PROPOFOL;  Surgeon: Regis Bill, MD;  Location: [facility];  Service: Endoscopy;  Laterality: N/A;   epidural injection  2018   steroids   GASTRIC BYPASS  2004   JOINT REPLACEMENT Bilateral 2005   knees   LUMBAR FUSION  2016   x 2/ L3-5   SHOULDER ARTHROSCOPY WITH ROTATOR CUFF REPAIR Right 2008   x 3   SPINAL CORD STIMULATOR IMPLANT  2018   [employer]   TEMPORAL ARTERY BIOPSY / LIGATION     TONSILLECTOMY     Patient Active Problem List   Diagnosis Date Noted   Insomnia due to medical condition 09/16/2022   Bilateral carpal tunnel syndrome 02/12/2022   At risk for prolonged QT interval syndrome 01/16/2022   Psychosis (HCC) 01/16/2022   Flatus 08/27/2021   Diarrhea  due to malabsorption 08/27/2021   Myoclonic jerking 08/06/2021   Polyneuropathy, unspecified 04/28/2021   Personal history of nicotine dependence 04/28/2021   Obstructive sleep apnea of adult 04/28/2021   Opioid type dependence, continuous (HCC) 03/13/2020   Spinal stenosis of lumbosacral region 03/13/2020   MDD (major depressive disorder), recurrent, in full remission (HCC) 12/29/2019   Hypersomnia 12/29/2019   MDD (major depressive disorder), recurrent, in partial remission (HCC) 10/27/2019   GAD (generalized anxiety disorder) 01/11/2019   Bereavement 01/11/2019   Opioid use with withdrawal (HCC) 01/11/2019   Pain syndrome, chronic 09/21/2018   Low serum vitamin B12 09/15/2018   Moderate episode of recurrent major depressive disorder (HCC) 09/15/2018   Memory loss 05/12/2018    Chronic pain of both shoulders 05/12/2018   Osteoporosis, post-menopausal 12/02/2017   Vitamin D deficiency, unspecified 07/22/2017   Candidiasis of skin and nails 07/13/2017   Menopausal symptom 07/13/2017   Bilateral hand pain 06/26/2017   CRP elevated 06/26/2017   Jaw pain, non-TMJ 06/26/2017   Temporal headache 06/26/2017   Stiffness of shoulder joint 06/26/2017   Screening for osteoporosis 06/26/2017   SOBOE (shortness of breath on exertion) 06/09/2017   Joint pain 05/20/2017   Arthritis 05/20/2017   History of lumbar fusion 04/23/2017   Lumbar degenerative disc disease 04/23/2017   Fibromyalgia 04/23/2017   Cervicalgia 04/23/2017   DDD (degenerative disc disease), cervical 04/23/2017   Chronic pain syndrome 04/23/2017   Spinal cord stimulator status 04/23/2017   Complete tear of right rotator cuff 04/17/2017   Rotator cuff tendinitis, right 04/17/2017   Rotator cuff arthropathy of both shoulders 04/12/2017   Degenerative joint disease of cervical and lumbar spine 03/17/2017   Generalized osteoarthritis of multiple sites 03/17/2017   Seasonal allergies 03/17/2017   GERD without esophagitis 01/18/2017   Hypothyroidism 01/18/2017   Fatty liver 12/08/2016   Osteoporosis 09/30/2016   Pain in the coccyx 08/19/2016   Delusional disorder (HCC) 05/27/2016   Essential hypertension 04/09/2016   Chiari malformation type I (HCC) 03/24/2016   Severe obesity (BMI >= 40) (HCC) 03/24/2016   Lumbosacral spondylosis without myelopathy 09/17/2015   Chronic midline low back pain 03/28/2015   Lumbosacral radiculitis 03/05/2015   Morgellons disease 12/01/2014   Medication-induced delirium, acute, hyperactive 11/17/2014   Fatigue 05/08/2014   Lumbar post-laminectomy syndrome 05/08/2014   Lumbar radiculopathy 05/08/2014   Cervical radiculopathy 05/08/2014   H/O gastric bypass 03/03/2014   Dysphagia, oropharyngeal phase 05/18/2013   Acquired spondylolisthesis 08/23/2012   Nonunion of  fracture 08/23/2012   Spinal stenosis of lumbar region without neurogenic claudication 08/23/2012   Dry eyes, bilateral 07/26/2012   Leg cramps 07/26/2012   Diastolic dysfunction 06/14/2012   Lumbar spondylosis 04/06/2012   Lumbar pseudoarthrosis 04/06/2012   Iron deficiency 01/14/2012   Chronic pain 12/16/2011   Edema 12/16/2011   Internal hemorrhoid 12/16/2011   OA (osteoarthritis) 12/16/2011    PCP: Wilford Corner, PA-C  REFERRING PROVIDER: Janice Coffin, PA-C   REFERRING DIAG: Imbalance  RATIONALE FOR EVALUATION AND TREATMENT: Rehabilitation  THERAPY DIAG: Unsteadiness on feet - Plan: PT plan of care cert/re-cert  Imbalance - Plan: PT plan of care cert/re-cert  Muscle weakness (generalized) - Plan: PT plan of care cert/re-cert  ONSET DATE: Chronic; "a couple years"  FOLLOW-UP APPT SCHEDULED WITH REFERRING PROVIDER: No    SUBJECTIVE:  SUBJECTIVE STATEMENT:  Imbalance and unsteadiness  PERTINENT HISTORY: Pt reports to PT today c/c imbalance beginning "a couple years ago." Pt began noticing imbalance prior to any falls, but has experienced a few until beginning use of rollator. She states "holding onto something helps to keep my balance." Her husband has Parkinson's and struggles to help her around the house. She has a son and 2 grandsons that live a few blocks away, but she does not see them often. Pt notes they will ocme over to help if she is specific about the task and time. Pt notes sciatica pain in LLE that doesn't go away, about 6 mos ongoing. Pt reports hx of BLE knee replacement surgery and further notes shoulder pain. Pt has had 3 shoulder surgeries to repair a torn rotator cuff in the RUE. Pt reports tearing again, MD advised BUE reverse shoulder arthroplasty, but pt has decided  against due to demands of rehabilitation and necessity for independence at home. She further notes back pain beginning a few years ago. Pt notes she underwent an L4-5 fusion, but had to undergo a second surgery after the first one failed. Pt reports second surgery included L3-L5, but also failed to fuse. Pt notes still experiencing back pain, and later underwent surgery for a spinal cord stimulator. She notes still having it, however it will not charge so it is unusable as fixing it would require another surgery. Pt notes that MD had previously advised against the surgery due to pt's weight at the time. She has now lost enough weight, but has decided against the surgery because she "feels scared, too old, and wants to see if PT can help first."  Pain: Yes Numbness/Tingling: Yes; down LLE, N/T into toes RLE Focal Weakness: Yes Recent changes in overall health/medication: No; see hx, taking several medications Prior history of physical therapy for balance: No; prior PT for back pain Dominant hand: right Imaging: Yes  Red flags: Negative for bowel/bladder changes, saddle paresthesia, personal history of cancer, h/o spinal tumors, h/o compression fx, h/o abdominal aneurysm, abdominal pain, chills/fever, night sweats, nausea, vomiting  PRECAUTIONS: None  WEIGHT BEARING RESTRICTIONS: No  FALLS: Has patient fallen in last 6 months? 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  Recent memory is intact.  Remote memory is intact.  Attention span and concentration are intact.  Expressive speech is intact.  Patient's fund of knowledge is within normal limits for educational level.    Gross Musculoskeletal Assessment Tremor: None Bulk: Normal Tone: Normal  Posture: Formal assessment deferred  AROM Formal assessment deferred; grossly WNL for LE  LE MMT: MMT (out of 5) Right  Left   Hip  flexion 4+ 4+  Hip extension    Hip abduction    Hip adduction    Hip internal rotation    Hip external rotation    Knee flexion 5 (seated position) 5 (seated position)  Knee extension 5 4+*  Ankle dorsiflexion 4* 5  Ankle plantarflexion WNL WNL  Ankle inversion    Ankle eversion    (* = pain; Blank rows = not tested)  Sensation Diminished to light touch on LLE L1 and S2. Proprioception, stereognosis, and hot/cold testing deferred on this date.  Reflexes Deferred  Cranial Nerves Deferred  Coordination/Cerebellar Deferred  Bed mobility: Deferred  Transfers: Assistive device utilized:  Rollator   Sit to stand: Modified independence Stand to sit: Modified independence Chair to chair: Modified independence Floor:  Deferred  Curb:  Deferred  Stairs: Deferred  Gait: Formal gait assessment deferred.   Functional Outcome Measures  Results Comments  BERG    DGI    FGA    TUG 10.56 seconds Using rollator  5TSTS 19.87 seconds Increased fall risk  6 Minute Walk Test    10 Meter Gait Speed Self-selected: 14.47s = 0.69 m/s Using rollator  (Blank rows = not tested)   TODAY'S TREATMENT   Deferred  PATIENT EDUCATION:  Education details: POC Person educated: Patient Education method: Explanation Education comprehension: verbalized understanding   HOME EXERCISE PROGRAM:  Deferred  ASSESSMENT:  CLINICAL IMPRESSION: Patient is a 68 y.o. female who was seen today for physical therapy evaluation for imbalance and unsteadiness. Pt demonstrated excellent motivation today during initial evaluation. Pt has proven to have few limitations with gait when using AD, however has difficulty with balance when not using AD support. Pt has slightly diminished sensation to the LLE in comparison to the right. Her overall gait and balance appears to be limited by back pain. Pt will benefit from PT services to address deficits in balance, and mobility in order to return to full function at home.   OBJECTIVE IMPAIRMENTS: decreased activity tolerance, decreased balance, decreased coordination, and postural dysfunction.   ACTIVITY LIMITATIONS: carrying, sitting, standing, squatting, stairs, and transfers  PARTICIPATION LIMITATIONS: community activity and occupation  PERSONAL FACTORS: Age, Fitness, Past/current experiences, Time since onset of injury/illness/exacerbation, and 3+ comorbidities: lumbar and cervical radiculopathy, polyneuropathy, DDD, osteoporosis, OA, spondylosis, hx of lumbar fusion, chronic pain syndrome  are also affecting patient's functional outcome.   REHAB POTENTIAL: Fair    CLINICAL DECISION MAKING: Unstable/unpredictable  EVALUATION COMPLEXITY: High   GOALS: Goals reviewed with patient? No  SHORT TERM GOALS: Target date:  07/08/23  Pt will be independent with HEP in order to improve strength and balance in order to decrease fall risk and improve function at home. Baseline:  Goal status: INITIAL   LONG TERM GOALS: Target date:  08/19/23  1.  Pt will improve BERG by at least 3 points in order to demonstrate clinically significant improvement in balance.    Baseline: To be completed Goal status: INITIAL  2.  Pt will improve ABC by at least 13% in order to demonstrate clinically significant improvement in balance confidence.      Baseline: To be completed  Goal status: INITIAL  3. Pt will decrease 5TSTS by at least 3 seconds in order to demonstrate clinically significant improvement in LE strength      Baseline: 19.87s Goal status: INITIAL  4. Pt will increase self-selected by at least 0.13 m/s in order to demonstrate clinically significant improvement in community ambulation.  Baseline: Self-selected: 14.47s = 0.69 m/s Goal status: INITIAL   PLAN: PT FREQUENCY: 2x/week  PT DURATION: 8 weeks  PLANNED INTERVENTIONS: Therapeutic exercises, Therapeutic activity, Neuromuscular re-education, Balance training, Gait training, Patient/Family education, Self Care, Joint mobilization, Joint manipulation, Vestibular training, Canalith repositioning, Orthotic/Fit training, DME instructions, Dry Needling, Electrical stimulation, Spinal manipulation, Spinal mobilization, Cryotherapy, Moist heat, Taping, Traction, Ultrasound, Ionotophoresis 4mg /ml Dexamethasone, Manual therapy, and Re-evaluation.  PLAN FOR NEXT SESSION: ABC, BERG, LE reflex testing, initiate HEP  Sherri Sear, SPT [employer] Huprich PT, DPT, GCS  Huprich,Jason 06/25/2023, 11:21 AM

## 2023-06-26 NOTE — Therapy (Signed)
 OUTPATIENT PHYSICAL THERAPY BALANCE TREATMENT  Patient Name: Connie [name] MRN: [id number] DOB:11/15/1955, 68 y.o., female Today's Date: 06/29/2023  END OF SESSION:  PT End of Session - 06/29/23 1313     Visit Number 2    Number of Visits 25    Date for PT Re-Evaluation 09/16/23    Authorization Type eval: 06/24/23    PT Start Time 1315    PT Stop Time 1400    PT Time Calculation (min) 45 min    Equipment Utilized During Treatment Gait belt    Activity Tolerance Patient tolerated treatment well    Behavior During Therapy WFL for tasks assessed/performed            Past Medical History:  Diagnosis Date   Anemia    in the past   Anxiety    Arthritis    all over   Cervical spondylosis with myelopathy 2018   Chronic pain 2019   can't stand straight, uses walker for stability   DDD (degenerative disc disease), lumbar    Depression    Displacement of cervical intervertebral disc 2018   Dyspnea    HANDICAPPED   Fibromyalgia    Polymyalgia Rheumatica   GERD (gastroesophageal reflux disease)    Heart murmur    not treated or being followed   Hypertension    CONTROLLED   Hypothyroidism    Lumbar post-laminectomy syndrome 2018   Lumbosacral radiculitis 2018   Neuropathy due to medical condition (HCC)    feet, toes, fingers   Osteoarthritis    Osteoporosis    Other long term (current) drug therapy 2019   from pain management   Pain in the coccyx 2018   disorder of sacrum   Sleep apnea    Spondylolisthesis 2018   Spondylosis of lumbosacral region without myelopathy or radiculopathy 2018   Spondylosis with myelopathy, lumbar region 2018   Vitamin D deficiency    Past Surgical History:  Procedure Laterality Date   ABDOMINAL HYSTERECTOMY  2000   APPENDECTOMY     arm surgery  2002   BREAST BIOPSY Left 2005   benign   CATARACT EXTRACTION W/PHACO Right 10/21/2017   Procedure: CATARACT EXTRACTION PHACO AND INTRAOCULAR LENS PLACEMENT (IOC) RIGHT;  Surgeon:  Lockie Mola, MD;  Location: [facility];  Service: Ophthalmology;  Laterality: Right;  neck issues   CESAREAN SECTION     x 2   CHOLECYSTECTOMY     COLONOSCOPY WITH PROPOFOL N/A 08/25/2017   Procedure: COLONOSCOPY WITH PROPOFOL;  Surgeon: Toledo, Boykin Nearing, MD;  Location: [facility];  Service: Gastroenterology;  Laterality: N/A;   COLONOSCOPY WITH PROPOFOL N/A 02/25/2022   Procedure: COLONOSCOPY WITH PROPOFOL;  Surgeon: Regis Bill, MD;  Location: [facility];  Service: Endoscopy;  Laterality: N/A;   epidural injection  2018   steroids   GASTRIC BYPASS  2004   JOINT REPLACEMENT Bilateral 2005   knees   LUMBAR FUSION  2016   x 2/ L3-5   SHOULDER ARTHROSCOPY WITH ROTATOR CUFF REPAIR Right 2008   x 3   SPINAL CORD STIMULATOR IMPLANT  2018   [employer]   TEMPORAL ARTERY BIOPSY / LIGATION     TONSILLECTOMY     Patient Active Problem List   Diagnosis Date Noted   Insomnia due to medical condition 09/16/2022   Bilateral carpal tunnel syndrome 02/12/2022   At risk for prolonged QT interval syndrome 01/16/2022   Psychosis (HCC) 01/16/2022   Flatus 08/27/2021   Diarrhea due  to malabsorption 08/27/2021   Myoclonic jerking 08/06/2021   Polyneuropathy, unspecified 04/28/2021   Personal history of nicotine dependence 04/28/2021   Obstructive sleep apnea of adult 04/28/2021   Opioid type dependence, continuous (HCC) 03/13/2020   Spinal stenosis of lumbosacral region 03/13/2020   MDD (major depressive disorder), recurrent, in full remission (HCC) 12/29/2019   Hypersomnia 12/29/2019   MDD (major depressive disorder), recurrent, in partial remission (HCC) 10/27/2019   GAD (generalized anxiety disorder) 01/11/2019   Bereavement 01/11/2019   Opioid use with withdrawal (HCC) 01/11/2019   Pain syndrome, chronic 09/21/2018   Low serum vitamin B12 09/15/2018   Moderate episode of recurrent major depressive disorder (HCC) 09/15/2018   Memory loss 05/12/2018    Chronic pain of both shoulders 05/12/2018   Osteoporosis, post-menopausal 12/02/2017   Vitamin D deficiency, unspecified 07/22/2017   Candidiasis of skin and nails 07/13/2017   Menopausal symptom 07/13/2017   Bilateral hand pain 06/26/2017   CRP elevated 06/26/2017   Jaw pain, non-TMJ 06/26/2017   Temporal headache 06/26/2017   Stiffness of shoulder joint 06/26/2017   Screening for osteoporosis 06/26/2017   SOBOE (shortness of breath on exertion) 06/09/2017   Joint pain 05/20/2017   Arthritis 05/20/2017   History of lumbar fusion 04/23/2017   Lumbar degenerative disc disease 04/23/2017   Fibromyalgia 04/23/2017   Cervicalgia 04/23/2017   DDD (degenerative disc disease), cervical 04/23/2017   Chronic pain syndrome 04/23/2017   Spinal cord stimulator status 04/23/2017   Complete tear of right rotator cuff 04/17/2017   Rotator cuff tendinitis, right 04/17/2017   Rotator cuff arthropathy of both shoulders 04/12/2017   Degenerative joint disease of cervical and lumbar spine 03/17/2017   Generalized osteoarthritis of multiple sites 03/17/2017   Seasonal allergies 03/17/2017   GERD without esophagitis 01/18/2017   Hypothyroidism 01/18/2017   Fatty liver 12/08/2016   Osteoporosis 09/30/2016   Pain in the coccyx 08/19/2016   Delusional disorder (HCC) 05/27/2016   Essential hypertension 04/09/2016   Chiari malformation type I (HCC) 03/24/2016   Severe obesity (BMI >= 40) (HCC) 03/24/2016   Lumbosacral spondylosis without myelopathy 09/17/2015   Chronic midline low back pain 03/28/2015   Lumbosacral radiculitis 03/05/2015   Morgellons disease 12/01/2014   Medication-induced delirium, acute, hyperactive 11/17/2014   Fatigue 05/08/2014   Lumbar post-laminectomy syndrome 05/08/2014   Lumbar radiculopathy 05/08/2014   Cervical radiculopathy 05/08/2014   H/O gastric bypass 03/03/2014   Dysphagia, oropharyngeal phase 05/18/2013   Acquired spondylolisthesis 08/23/2012   Nonunion of  fracture 08/23/2012   Spinal stenosis of lumbar region without neurogenic claudication 08/23/2012   Dry eyes, bilateral 07/26/2012   Leg cramps 07/26/2012   Diastolic dysfunction 06/14/2012   Lumbar spondylosis 04/06/2012   Lumbar pseudoarthrosis 04/06/2012   Iron deficiency 01/14/2012   Chronic pain 12/16/2011   Edema 12/16/2011   Internal hemorrhoid 12/16/2011   OA (osteoarthritis) 12/16/2011    PCP: Wilford Corner, PA-C  REFERRING PROVIDER: Janice Coffin, PA-C   REFERRING DIAG: Imbalance  RATIONALE FOR EVALUATION AND TREATMENT: Rehabilitation  THERAPY DIAG: Unsteadiness on feet  Muscle weakness (generalized)  ONSET DATE: Chronic; "a couple years"  FOLLOW-UP APPT SCHEDULED WITH REFERRING PROVIDER: No    SUBJECTIVE:  SUBJECTIVE STATEMENT:  Imbalance and unsteadiness  PERTINENT HISTORY: Pt reports to PT today c/c imbalance beginning "a couple years ago." Pt began noticing imbalance prior to any falls, but has experienced a few until beginning use of rollator. She states "holding onto something helps to keep my balance." Her husband has Parkinson's and struggles to help her around the house. She has a son and 2 grandsons that live a few blocks away, but she does not see them often. Pt notes they will ocme over to help if she is specific about the task and time. Pt notes sciatica pain in LLE that doesn't go away, about 6 mos ongoing. Pt reports hx of BLE knee replacement surgery and further notes shoulder pain. Pt has had 3 shoulder surgeries to repair a torn rotator cuff in the RUE. Pt reports tearing again, MD advised BUE reverse shoulder arthroplasty, but pt has decided against due to demands of rehabilitation and necessity for independence at home. She further notes back pain beginning a few  years ago. Pt notes she underwent an L4-5 fusion, but had to undergo a second surgery after the first one failed. Pt reports second surgery included L3-L5, but also failed to fuse. Pt notes still experiencing back pain, and later underwent surgery for a spinal cord stimulator. She notes still having it, however it will not charge so it is unusable as fixing it would require another surgery. Pt notes that MD had previously advised against the surgery due to pt's weight at the time. She has now lost enough weight, but has decided against the surgery because she "feels scared, too old, and wants to see if PT can help first."  Pain: Yes Numbness/Tingling: Yes; down LLE, N/T into toes RLE Focal Weakness: Yes Recent changes in overall health/medication: No; see hx, taking several medications Prior history of physical therapy for balance: No; prior PT for back pain Dominant hand: right Imaging: Yes  Red flags: Negative for bowel/bladder changes, saddle paresthesia, personal history of cancer, h/o spinal tumors, h/o compression fx, h/o abdominal aneurysm, abdominal pain, chills/fever, night sweats, nausea, vomiting  PRECAUTIONS: None  WEIGHT BEARING RESTRICTIONS: No  FALLS: Has patient fallen in last 6 months? 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  Recent memory is intact.  Remote memory is intact.  Attention span and concentration are intact.  Expressive speech is intact.  Patient's fund of knowledge is within normal limits for educational level.    Gross Musculoskeletal Assessment Tremor: None Bulk: Normal Tone: Normal  Posture: Formal assessment deferred  AROM Formal assessment deferred; grossly WNL for LE  LE MMT: MMT (out of 5) Right  Left   Hip flexion  4+ 4+  Hip extension    Hip abduction    Hip adduction    Hip internal rotation    Hip external rotation    Knee flexion 5 (seated position) 5 (seated position)  Knee extension 5 4+*  Ankle dorsiflexion 4* 5  Ankle plantarflexion WNL WNL  Ankle inversion    Ankle eversion    (* = pain; Blank rows = not tested)  Sensation Diminished to light touch on LLE L1 and S2. Proprioception, stereognosis, and hot/cold testing deferred on this date.  Reflexes Deferred  Cranial Nerves Deferred  Coordination/Cerebellar Deferred  Bed mobility: Deferred  Transfers: Assistive device utilized:  Rollator   Sit to stand: Modified independence Stand to sit: Modified independence Chair to chair: Modified independence Floor:  Deferred  Curb:  Deferred  Stairs: Deferred  Gait: Formal gait assessment deferred.   Functional Outcome Measures  Results Comments  BERG    DGI    FGA    TUG 10.56 seconds Using rollator  5TSTS 19.87 seconds Increased fall risk  6 Minute Walk Test    10 Meter Gait Speed Self-selected: 14.47s = 0.69 m/s Using rollator  (Blank rows = not tested)   TODAY'S TREATMENT    SUBJECTIVE: Pt reports that she is doing well today. No changes since the initial evaluation. Denies pertinent pain but continues  with ongoing chronic pain. No specific questions or concerns.    PAIN: Ongoing chronic pain;   Therapeutic Activity NuStep L1-2 x 5 minutes for BLE strengthening and warm-up during interval history;  Updated outcome measures: ABC: 56.9% BERG: 46/56; mCTSIB: 30s in conditions 1-4 with 3+ sway in condition 4; Sit to stand from regular height chair with Airex pad on seat x 10, removed Airex pad x 10; Standing heel raises with faded UE support 3s hold x 10; HEP reviewed with patient;   Neuromuscular Re-education  Feet together eyes open/closed x 30s each; Feet together horizontal and vertical head turns x 30s each; Airex feet together eyes open/closed x 30s each; Airex feet together horizontal and vertical head turns x 30s each;    PATIENT EDUCATION:  Education details: Pt educated throughout session about proper posture and technique with exercises. Improved exercise technique, movement at target joints, use of target muscles after min to mod verbal, visual, tactile cues.  Person educated: Patient Education method: Explanation Education comprehension: verbalized understanding   HOME EXERCISE PROGRAM:  Access Code: [email or web address] URL: [email or web address] Date: 06/29/2023 Prepared by: Ria Comment  Exercises - Sit to Stand Without Arm Support  - 1 x daily - 7 x weekly - 3 sets - 10 reps - Heel Raises with Counter Support  - 1 x daily - 7 x weekly - 3 sets - 10 reps - 3s hold - Standing Romberg to 1/2 Tandem Stance  - 1 x daily - 7 x weekly - 4 reps - 60s hold   ASSESSMENT:  CLINICAL IMPRESSION: Performed additional outcome measures with patient during session. She completed the ABC and scored 56.9% which is below the cut-off for increased fall risk. Her BERG score of 46/56 also places her in a moderate to high risk category for future falls. Initiated balance and strength exercises during session today with patient. Issued HEP and reviewed with patient. Pt  encouraged to follow-up as scheduled. She will benefit from PT services to address deficits in weakness, balance, and mobility in order to return to full function at home as well as decrease risk for falls.   OBJECTIVE IMPAIRMENTS:  decreased activity tolerance, decreased balance, decreased coordination, and postural dysfunction.   ACTIVITY LIMITATIONS: carrying, sitting, standing, squatting, stairs, and transfers  PARTICIPATION LIMITATIONS: community activity and occupation  PERSONAL FACTORS: Age, Fitness, Past/current experiences, Time since onset of injury/illness/exacerbation, and 3+ comorbidities: lumbar and cervical radiculopathy, polyneuropathy, DDD, osteoporosis, OA, spondylosis, hx of lumbar fusion, chronic pain syndrome  are also affecting patient's functional outcome.   REHAB POTENTIAL: Fair    CLINICAL DECISION MAKING: Unstable/unpredictable  EVALUATION COMPLEXITY: High   GOALS: Goals reviewed with patient? No  SHORT TERM GOALS: Target date: 07/08/23  Pt will be independent with HEP in order to improve strength and balance in order to decrease fall risk and improve function at home. Baseline:  Goal status: INITIAL   LONG TERM GOALS: Target date: 09/16/23  1.  Pt will improve BERG by at least 3 points in order to demonstrate clinically significant improvement in balance.   Baseline: 06/29/23: Goal status: INITIAL  2.  Pt will improve ABC by at least 13% in order to demonstrate clinically significant improvement in balance confidence.      Baseline: 06/29/23: 56.9% Goal status: INITIAL  3. Pt will decrease 5TSTS by at least 3 seconds in order to demonstrate clinically significant improvement in LE strength      Baseline: 19.87s Goal status: INITIAL  4. Pt will increase self-selected by at least 0.13 m/s in order to demonstrate clinically significant improvement in community ambulation.      Baseline: Self-selected: 14.47s = 0.69 m/s Goal status:  INITIAL   PLAN: PT FREQUENCY: 2x/week  PT DURATION: 8 weeks  PLANNED INTERVENTIONS: Therapeutic exercises, Therapeutic activity, Neuromuscular re-education, Balance training, Gait training, Patient/Family education, Self Care, Joint mobilization, Joint manipulation, Vestibular training, Canalith repositioning, Orthotic/Fit training, DME instructions, Dry Needling, Electrical stimulation, Spinal manipulation, Spinal mobilization, Cryotherapy, Moist heat, Taping, Traction, Ultrasound, Ionotophoresis 4mg /ml Dexamethasone, Manual therapy, and Re-evaluation.  PLAN FOR NEXT SESSION: progress strength and balance exercises, reviewe/modify HEP as necessary.   Sharalyn Ink Annely Sliva PT, DPT, GCS  Jaquelinne Glendening 06/29/2023, 2:13 PM

## 2023-06-29 ENCOUNTER — Ambulatory Visit: Payer: Medicare Other | Attending: Student

## 2023-06-29 DIAGNOSIS — R2681 Unsteadiness on feet: Secondary | ICD-10-CM | POA: Insufficient documentation

## 2023-06-29 DIAGNOSIS — R2689 Other abnormalities of gait and mobility: Secondary | ICD-10-CM | POA: Diagnosis present

## 2023-06-29 DIAGNOSIS — M6281 Muscle weakness (generalized): Secondary | ICD-10-CM | POA: Insufficient documentation

## 2023-06-29 DIAGNOSIS — R262 Difficulty in walking, not elsewhere classified: Secondary | ICD-10-CM | POA: Insufficient documentation

## 2023-07-01 ENCOUNTER — Ambulatory Visit: Payer: Medicare Other

## 2023-07-01 NOTE — Therapy (Deleted)
 OUTPATIENT PHYSICAL THERAPY BALANCE TREATMENT  Patient Name: Anetta Olvera MRN: [id number] DOB:Jun 21, 1955, 68 y.o., female Today's Date: 07/01/2023  END OF SESSION:   Past Medical History:  Diagnosis Date   Anemia    in the past   Anxiety    Arthritis    all over   Cervical spondylosis with myelopathy 2018   Chronic pain 2019   can't stand straight, uses walker for stability   DDD (degenerative disc disease), lumbar    Depression    Displacement of cervical intervertebral disc 2018   Dyspnea    HANDICAPPED   Fibromyalgia    Polymyalgia Rheumatica   GERD (gastroesophageal reflux disease)    Heart murmur    not treated or being followed   Hypertension    CONTROLLED   Hypothyroidism    Lumbar post-laminectomy syndrome 2018   Lumbosacral radiculitis 2018   Neuropathy due to medical condition (HCC)    feet, toes, fingers   Osteoarthritis    Osteoporosis    Other long term (current) drug therapy 2019   from pain management   Pain in the coccyx 2018   disorder of sacrum   Sleep apnea    Spondylolisthesis 2018   Spondylosis of lumbosacral region without myelopathy or radiculopathy 2018   Spondylosis with myelopathy, lumbar region 2018   Vitamin D deficiency    Past Surgical History:  Procedure Laterality Date   ABDOMINAL HYSTERECTOMY  2000   APPENDECTOMY     arm surgery  2002   BREAST BIOPSY Left 2005   benign   CATARACT EXTRACTION W/PHACO Right 10/21/2017   Procedure: CATARACT EXTRACTION PHACO AND INTRAOCULAR LENS PLACEMENT (IOC) RIGHT;  Surgeon: Lockie Mola, MD;  Location: [facility];  Service: Ophthalmology;  Laterality: Right;  neck issues   CESAREAN SECTION     x 2   CHOLECYSTECTOMY     COLONOSCOPY WITH PROPOFOL N/A 08/25/2017   Procedure: COLONOSCOPY WITH PROPOFOL;  Surgeon: Toledo, Boykin Nearing, MD;  Location: [facility];  Service: Gastroenterology;  Laterality: N/A;   COLONOSCOPY WITH PROPOFOL N/A 02/25/2022   Procedure: COLONOSCOPY  WITH PROPOFOL;  Surgeon: Regis Bill, MD;  Location: [facility];  Service: Endoscopy;  Laterality: N/A;   epidural injection  2018   steroids   GASTRIC BYPASS  2004   JOINT REPLACEMENT Bilateral 2005   knees   LUMBAR FUSION  2016   x 2/ L3-5   SHOULDER ARTHROSCOPY WITH ROTATOR CUFF REPAIR Right 2008   x 3   SPINAL CORD STIMULATOR IMPLANT  2018   [employer]   TEMPORAL ARTERY BIOPSY / LIGATION     TONSILLECTOMY     Patient Active Problem List   Diagnosis Date Noted   Insomnia due to medical condition 09/16/2022   Bilateral carpal tunnel syndrome 02/12/2022   At risk for prolonged QT interval syndrome 01/16/2022   Psychosis (HCC) 01/16/2022   Flatus 08/27/2021   Diarrhea due to malabsorption 08/27/2021   Myoclonic jerking 08/06/2021   Polyneuropathy, unspecified 04/28/2021   Personal history of nicotine dependence 04/28/2021   Obstructive sleep apnea of adult 04/28/2021   Opioid type dependence, continuous (HCC) 03/13/2020   Spinal stenosis of lumbosacral region 03/13/2020   MDD (major depressive disorder), recurrent, in full remission (HCC) 12/29/2019   Hypersomnia 12/29/2019   MDD (major depressive disorder), recurrent, in partial remission (HCC) 10/27/2019   GAD (generalized anxiety disorder) 01/11/2019   Bereavement 01/11/2019   Opioid use with withdrawal (HCC) 01/11/2019   Pain syndrome, chronic  09/21/2018   Low serum vitamin B12 09/15/2018   Moderate episode of recurrent major depressive disorder (HCC) 09/15/2018   Memory loss 05/12/2018   Chronic pain of both shoulders 05/12/2018   Osteoporosis, post-menopausal 12/02/2017   Vitamin D deficiency, unspecified 07/22/2017   Candidiasis of skin and nails 07/13/2017   Menopausal symptom 07/13/2017   Bilateral hand pain 06/26/2017   CRP elevated 06/26/2017   Jaw pain, non-TMJ 06/26/2017   Temporal headache 06/26/2017   Stiffness of shoulder joint 06/26/2017   Screening for osteoporosis 06/26/2017    SOBOE (shortness of breath on exertion) 06/09/2017   Joint pain 05/20/2017   Arthritis 05/20/2017   History of lumbar fusion 04/23/2017   Lumbar degenerative disc disease 04/23/2017   Fibromyalgia 04/23/2017   Cervicalgia 04/23/2017   DDD (degenerative disc disease), cervical 04/23/2017   Chronic pain syndrome 04/23/2017   Spinal cord stimulator status 04/23/2017   Complete tear of right rotator cuff 04/17/2017   Rotator cuff tendinitis, right 04/17/2017   Rotator cuff arthropathy of both shoulders 04/12/2017   Degenerative joint disease of cervical and lumbar spine 03/17/2017   Generalized osteoarthritis of multiple sites 03/17/2017   Seasonal allergies 03/17/2017   GERD without esophagitis 01/18/2017   Hypothyroidism 01/18/2017   Fatty liver 12/08/2016   Osteoporosis 09/30/2016   Pain in the coccyx 08/19/2016   Delusional disorder (HCC) 05/27/2016   Essential hypertension 04/09/2016   Chiari malformation type I (HCC) 03/24/2016   Severe obesity (BMI >= 40) (HCC) 03/24/2016   Lumbosacral spondylosis without myelopathy 09/17/2015   Chronic midline low back pain 03/28/2015   Lumbosacral radiculitis 03/05/2015   Morgellons disease 12/01/2014   Medication-induced delirium, acute, hyperactive 11/17/2014   Fatigue 05/08/2014   Lumbar post-laminectomy syndrome 05/08/2014   Lumbar radiculopathy 05/08/2014   Cervical radiculopathy 05/08/2014   H/O gastric bypass 03/03/2014   Dysphagia, oropharyngeal phase 05/18/2013   Acquired spondylolisthesis 08/23/2012   Nonunion of fracture 08/23/2012   Spinal stenosis of lumbar region without neurogenic claudication 08/23/2012   Dry eyes, bilateral 07/26/2012   Leg cramps 07/26/2012   Diastolic dysfunction 06/14/2012   Lumbar spondylosis 04/06/2012   Lumbar pseudoarthrosis 04/06/2012   Iron deficiency 01/14/2012   Chronic pain 12/16/2011   Edema 12/16/2011   Internal hemorrhoid 12/16/2011   OA (osteoarthritis) 12/16/2011    PCP:  Wilford Corner, PA-C  REFERRING PROVIDER: Janice Coffin, PA-C   REFERRING DIAG: Imbalance  RATIONALE FOR EVALUATION AND TREATMENT: Rehabilitation  THERAPY DIAG: Unsteadiness on feet  Muscle weakness (generalized)  ONSET DATE: Chronic; "a couple years"  FOLLOW-UP APPT SCHEDULED WITH REFERRING PROVIDER: No    SUBJECTIVE:  SUBJECTIVE STATEMENT:  Imbalance and unsteadiness  PERTINENT HISTORY: Pt reports to PT today c/c imbalance beginning "a couple years ago." Pt began noticing imbalance prior to any falls, but has experienced a few until beginning use of rollator. She states "holding onto something helps to keep my balance." Her husband has Parkinson's and struggles to help her around the house. She has a son and 2 grandsons that live a few blocks away, but she does not see them often. Pt notes they will ocme over to help if she is specific about the task and time. Pt notes sciatica pain in LLE that doesn't go away, about 6 mos ongoing. Pt reports hx of BLE knee replacement surgery and further notes shoulder pain. Pt has had 3 shoulder surgeries to repair a torn rotator cuff in the RUE. Pt reports tearing again, MD advised BUE reverse shoulder arthroplasty, but pt has decided against due to demands of rehabilitation and necessity for independence at home. She further notes back pain beginning a few years ago. Pt notes she underwent an L4-5 fusion, but had to undergo a second surgery after the first one failed. Pt reports second surgery included L3-L5, but also failed to fuse. Pt notes still experiencing back pain, and later underwent surgery for a spinal cord stimulator. She notes still having it, however it will not charge so it is unusable as fixing it would require another surgery. Pt notes that MD had  previously advised against the surgery due to pt's weight at the time. She has now lost enough weight, but has decided against the surgery because she "feels scared, too old, and wants to see if PT can help first."  Pain: Yes Numbness/Tingling: Yes; down LLE, N/T into toes RLE Focal Weakness: Yes Recent changes in overall health/medication: No; see hx, taking several medications Prior history of physical therapy for balance: No; prior PT for back pain Dominant hand: right Imaging: Yes  Red flags: Negative for bowel/bladder changes, saddle paresthesia, personal history of cancer, h/o spinal tumors, h/o compression fx, h/o abdominal aneurysm, abdominal pain, chills/fever, night sweats, nausea, vomiting  PRECAUTIONS: None  WEIGHT BEARING RESTRICTIONS: No  FALLS: Has patient fallen in last 6 months? 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  Recent memory is intact.  Remote memory is intact.  Attention span and concentration are intact.  Expressive speech is intact.  Patient's fund of knowledge is within normal  limits for educational level.    Gross Musculoskeletal Assessment Tremor: None Bulk: Normal Tone: Normal  Posture: Formal assessment deferred  AROM Formal assessment deferred; grossly WNL for LE  LE MMT: MMT (out of 5) Right  Left   Hip flexion  4+ 4+  Hip extension    Hip abduction    Hip adduction    Hip internal rotation    Hip external rotation    Knee flexion 5 (seated position) 5 (seated position)  Knee extension 5 4+*  Ankle dorsiflexion 4* 5  Ankle plantarflexion WNL WNL  Ankle inversion    Ankle eversion    (* = pain; Blank rows = not tested)  Sensation Diminished to light touch on LLE L1 and S2. Proprioception, stereognosis, and hot/cold testing deferred on this date.  Reflexes Deferred  Cranial Nerves Deferred  Coordination/Cerebellar Deferred  Bed mobility: Deferred  Transfers: Assistive device utilized:  Rollator   Sit to stand: Modified independence Stand to sit: Modified independence Chair to chair: Modified independence Floor:  Deferred  Curb:  Deferred  Stairs: Deferred  Gait: Formal gait assessment deferred.   Functional Outcome Measures  Results Comments  BERG    DGI    FGA    TUG 10.56 seconds Using rollator  5TSTS 19.87 seconds Increased fall risk  6 Minute Walk Test    10 Meter Gait Speed Self-selected: 14.47s = 0.69 m/s Using rollator  (Blank rows = not tested)   TODAY'S TREATMENT    SUBJECTIVE: Pt reports that she is doing well today. No changes since the initial evaluation. Denies pertinent pain but continues with ongoing chronic pain. No specific questions or concerns.    PAIN: Ongoing chronic pain;   Therapeutic Activity NuStep L1-2 x 5 minutes for BLE strengthening and warm-up during interval history;  Updated outcome measures: ABC: 56.9% BERG: 46/56; mCTSIB:  Sit to stand from regular height chair with Airex pad on seat x 10, removed Airex pad x 10; Standing heel raises with faded UE support 3s hold  x 10; HEP reviewed with patient;   Neuromuscular Re-education  Feet together eyes open/closed x 30s each; Feet together horizontal and vertical head turns x 30s each; Airex feet together eyes open/closed x 30s each; Airex feet together horizontal and vertical head turns x 30s each;    PATIENT EDUCATION:  Education details: Pt educated throughout session about proper posture and technique with exercises. Improved exercise technique, movement at target joints, use of target muscles after min to mod verbal, visual, tactile cues.  Person educated: Patient Education method: Explanation Education comprehension: verbalized understanding   HOME EXERCISE PROGRAM:  Access Code: [email or web address] URL: [email or web address] Date: 06/29/2023 Prepared by: Ria Comment  Exercises - Sit to Stand Without Arm Support  - 1 x daily - 7 x weekly - 3 sets - 10 reps - Heel Raises with Counter Support  - 1 x daily - 7 x weekly - 3 sets - 10 reps - 3s hold - Standing Romberg to 1/2 Tandem Stance  - 1 x daily - 7 x weekly - 4 reps - 60s hold   ASSESSMENT:  CLINICAL IMPRESSION: Performed additional outcome measures with patient during session. She completed the ABC and scored 56.9% which is below the cut-off for increased fall risk. Her BERG score of 46/56 also places her in a moderate to high risk category for future falls. Initiated balance and strength exercises during session today with patient. Issued HEP and reviewed with patient. Pt encouraged to follow-up as scheduled. She will benefit from PT services to address deficits in weakness, balance, and mobility in order to return to full function at home as well as decrease risk for falls.   OBJECTIVE IMPAIRMENTS: decreased activity tolerance, decreased balance, decreased coordination, and postural dysfunction.  ACTIVITY LIMITATIONS: carrying, sitting, standing, squatting, stairs, and transfers  PARTICIPATION LIMITATIONS: community activity  and occupation  PERSONAL FACTORS: Age, Fitness, Past/current experiences, Time since onset of injury/illness/exacerbation, and 3+ comorbidities: lumbar and cervical radiculopathy, polyneuropathy, DDD, osteoporosis, OA, spondylosis, hx of lumbar fusion, chronic pain syndrome  are also affecting patient's functional outcome.   REHAB POTENTIAL: Fair    CLINICAL DECISION MAKING: Unstable/unpredictable  EVALUATION COMPLEXITY: High   GOALS: Goals reviewed with patient? No  SHORT TERM GOALS: Target date: 07/08/23  Pt will be independent with HEP in order to improve strength and balance in order to decrease fall risk and improve function at home. Baseline:  Goal status: INITIAL   LONG TERM GOALS: Target date: 09/16/23  1.  Pt will improve BERG by at least 3 points in order to demonstrate clinically significant improvement in balance.   Baseline: 06/29/23: Goal status: INITIAL  2.  Pt will improve ABC by at least 13% in order to demonstrate clinically significant improvement in balance confidence.      Baseline: 06/29/23: 56.9% Goal status: INITIAL  3. Pt will decrease 5TSTS by at least 3 seconds in order to demonstrate clinically significant improvement in LE strength      Baseline: 19.87s Goal status: INITIAL  4. Pt will increase self-selected by at least 0.13 m/s in order to demonstrate clinically significant improvement in community ambulation.      Baseline: Self-selected: 14.47s = 0.69 m/s Goal status: INITIAL   PLAN: PT FREQUENCY: 2x/week  PT DURATION: 8 weeks  PLANNED INTERVENTIONS: Therapeutic exercises, Therapeutic activity, Neuromuscular re-education, Balance training, Gait training, Patient/Family education, Self Care, Joint mobilization, Joint manipulation, Vestibular training, Canalith repositioning, Orthotic/Fit training, DME instructions, Dry Needling, Electrical stimulation, Spinal manipulation, Spinal mobilization, Cryotherapy, Moist heat, Taping, Traction,  Ultrasound, Ionotophoresis 4mg /ml Dexamethasone, Manual therapy, and Re-evaluation.  PLAN FOR NEXT SESSION: progress strength and balance exercises, reviewe/modify HEP as necessary.   Sharalyn Ink Marliyah Reid PT, DPT, GCS  Dreana Britz 07/01/2023, 8:41 AM

## 2023-07-02 ENCOUNTER — Ambulatory Visit: Payer: Medicare Other | Admitting: Internal Medicine

## 2023-07-02 ENCOUNTER — Encounter: Payer: Self-pay | Admitting: Internal Medicine

## 2023-07-02 VITALS — BP 110/66 | HR 60 | Temp 97.6°F | Ht 60.0 in | Wt 219.4 lb

## 2023-07-02 DIAGNOSIS — G4710 Hypersomnia, unspecified: Secondary | ICD-10-CM

## 2023-07-02 DIAGNOSIS — G4733 Obstructive sleep apnea (adult) (pediatric): Secondary | ICD-10-CM

## 2023-07-02 NOTE — Progress Notes (Signed)
 Name: Connie [name] MRN: [id number] DOB: [birth year]    CHIEF COMPLAINT:  EXCESSIVE DAYTIME SLEEPINESS Assessment of OSA   HISTORY OF PRESENT ILLNESS: Patient is seen today for problems and issues with sleep related to excessive daytime sleepiness Patient  has been having sleep problems for many years Patient has been having excessive daytime sleepiness for a long time Patient has been having extreme fatigue and tiredness, lack of energy +  very Loud snoring every night + Nonrefreshing sleep  Discussed sleep data and reviewed with patient.  Encouraged proper weight management.  Discussed driving precautions and its relationship with hypersomnolence.  Discussed operating dangerous equipment and its relationship with hypersomnolence.  Discussed sleep hygiene, and benefits of a fixed sleep waked time.  The importance of getting eight or more hours of sleep discussed with patient.  Discussed limiting the use of the computer and television before bedtime.  Decrease naps during the day, so night time sleep will become enhanced.  Limit caffeine, and sleep deprivation.  HTN, stroke, and heart failure are potential risk factors.    EPWORTH SLEEP SCORE 8  Patient states she has had home sleep study in the past She was diagnosed with sleep apnea She underwent CPAP therapy but could not tolerate the mask Patient is here for reassessment No exacerbation at this time No evidence of heart failure at this time No evidence or signs of infection at this time No respiratory distress No fevers, chills, nausea, vomiting, diarrhea No evidence of lower extremity edema No evidence hemoptysis   PAST MEDICAL HISTORY :   has a past medical history of Anemia, Anxiety, Arthritis, Cervical spondylosis with myelopathy (2018), Chronic pain (2019), DDD (degenerative disc disease), lumbar, Depression, Displacement of cervical intervertebral disc (2018), Dyspnea, Fibromyalgia, GERD (gastroesophageal  reflux disease), Heart murmur, Hypertension, Hypothyroidism, Lumbar post-laminectomy syndrome (2018), Lumbosacral radiculitis (2018), Neuropathy due to medical condition (HCC), Osteoarthritis, Osteoporosis, Other long term (current) drug therapy (2019), Pain in the coccyx (2018), Sleep apnea, Spondylolisthesis (2018), Spondylosis of lumbosacral region without myelopathy or radiculopathy (2018), Spondylosis with myelopathy, lumbar region (2018), and Vitamin D deficiency.  has a past surgical history that includes Cesarean section; Gastric bypass (2004); Lumbar fusion (2016); Spinal cord stimulator implant (2018); Cholecystectomy; Appendectomy; Tonsillectomy; Shoulder arthroscopy with rotator cuff repair (Right, 2008); Joint replacement (Bilateral, 2005); Abdominal hysterectomy (2000); epidural injection (2018); arm surgery (2002); Breast biopsy (Left, 2005); Colonoscopy with propofol (N/A, 08/25/2017); Temporal artery biopsy / ligation; Cataract extraction w/PHACO (Right, 10/21/2017); and Colonoscopy with propofol (N/A, 02/25/2022). Prior to Admission medications   Medication Sig Start Date End Date Taking? Authorizing Provider  azelastine (ASTELIN) 0.1 % nasal spray Place into the nose. 05/05/23 05/04/24  [provider]  buPROPion (WELLBUTRIN XL) 150 MG 24 hr tablet Take 1 tablet (150 mg total) by mouth daily. 01/12/23   Jomarie Longs, MD  celecoxib (CELEBREX) 100 MG capsule Take by mouth. 12/17/21   [provider]  Cyanocobalamin (VITAMIN B-12 IJ) Inject as directed. Once a month    [provider]  fluticasone (FLONASE) 50 MCG/ACT nasal spray Place into the nose. 05/05/23 05/04/24  [provider]  furosemide (LASIX) 40 MG tablet Take by mouth. 05/05/23   [provider]  levorphanol (LEVODROMORAN) 2 MG tablet Take 1 tablet (2 mg total) by mouth every 8 (eight) hours as needed for pain. 06/13/23 07/13/23  Edward Jolly, MD  levorphanol (LEVODROMORAN) 2 MG tablet Take 1  tablet (2 mg total) by mouth every 8 (eight) hours as needed  for pain. 07/13/23 08/12/23  Edward Jolly, MD  levothyroxine (SYNTHROID) 50 MCG tablet  12/13/18   [provider]  metoprolol succinate (TOPROL-XL) 100 MG 24 hr tablet Take 100 mg by mouth once daily in the evening 04/14/17   [provider]  omeprazole (PRILOSEC) 20 MG capsule Take 20 mg by mouth daily. 12/11/21   [provider]  pregabalin (LYRICA) 150 MG capsule Take 150 mg by mouth 3 (three) times daily. 10/16/21   [provider]  QUEtiapine (SEROQUEL) 50 MG tablet TAKE 1 TABLET(50 MG) BY MOUTH AT BEDTIME AS NEEDED 05/08/23   Jomarie Longs, MD  traZODone (DESYREL) 50 MG tablet TAKE 1 TO 2 TABLETS(50 TO 100 MG) BY MOUTH AT BEDTIME AS NEEDED FOR SLEEP 02/16/23   Jomarie Longs, MD   No Known Allergies  FAMILY HISTORY:  family history includes Anxiety disorder in her mother; Breast cancer in her cousin, maternal aunt, and mother; Cancer in her brother, father, and mother; Depression in her mother; Hypertension in her father; Kidney disease in her mother. SOCIAL HISTORY:  reports that she quit smoking about 16 years ago. Her smoking use included cigarettes. She started smoking about 36 years ago. She has a 10 pack-year smoking history. She has never used smokeless tobacco. She reports that she does not drink alcohol and does not use drugs.  BP 110/66 (BP Location: Left Arm, Patient Position: Sitting, Cuff Size: Normal)   Pulse 60   Temp 97.6 F (36.4 C) (Temporal)   Ht 5' (1.524 m)   Wt 219 lb 6.4 oz (99.5 kg)   SpO2 97%   BMI 42.85 kg/m     Review of Systems: Gen:  Denies  fever, sweats, chills weight loss  HEENT: Denies blurred vision, double vision, ear pain, eye pain, hearing loss, nose bleeds, sore throat Cardiac:  No dizziness, chest pain or heaviness, chest tightness,edema, No JVD Resp:   No cough, -sputum production, -shortness of breath,-wheezing, -hemoptysis,  Other:  All other  systems negative   Physical Examination:   General Appearance: No distress  EYES PERRLA, EOM intact.   NECK Supple, No JVD Pulmonary: normal breath sounds, No wheezing.  CardiovascularNormal S1,S2.  No m/r/g.   Abdomen: Benign, Soft, non-tender. Neurology UE/LE 5/5 strength, no focal deficits Ext pulses intact, cap refill intact ALL OTHER ROS ARE NEGATIVE     ASSESSMENT AND PLAN SYNOPSIS  Patient with signs and symptoms of excessive daytime sleepiness with probable underlying diagnosis of obstructive sleep apnea in the setting of obesity and deconditioned state   Recommend Sleep Study for definitve diagnosis Recommend home sleep study  Obesity -recommend significant weight loss -recommend changing diet  Deconditioned state -Recommend increased daily activity and exercise   MEDICATION ADJUSTMENTS/LABS AND TESTS ORDERED: Recommend Sleep Study Recommend weight loss   CURRENT MEDICATIONS REVIEWED AT LENGTH WITH PATIENT TODAY   Patient  satisfied with Plan of action and management. All questions answered  Follow up  3 months   I spent a total of  45 minutes reviewing chart data, face-to-face evaluation with the patient, counseling and coordination of care as detailed above.    Lucie Leather, M.D.  [facility]  Medical Director [facility] Medical Director [facility] Cardio-Pulmonary Department

## 2023-07-02 NOTE — Patient Instructions (Signed)
 Recommend obtaining home sleep study to assess for sleep apnea Avoid Allergens and Irritants Avoid secondhand smoke Avoid SICK contacts Recommend  Masking  when appropriate Recommend Keep up-to-date with vaccinations

## 2023-07-06 ENCOUNTER — Ambulatory Visit: Payer: Medicare Other

## 2023-07-06 DIAGNOSIS — R2681 Unsteadiness on feet: Secondary | ICD-10-CM

## 2023-07-06 DIAGNOSIS — M6281 Muscle weakness (generalized): Secondary | ICD-10-CM

## 2023-07-06 NOTE — Therapy (Signed)
 OUTPATIENT PHYSICAL THERAPY BALANCE TREATMENT  Patient Name: Connie [name] MRN: [id number] DOB:09-24-1955, 68 y.o., female Today's Date: 07/08/2023  END OF SESSION:  PT End of Session - 07/08/23 1312     Visit Number 3    Number of Visits 25    Date for PT Re-Evaluation 09/16/23    Authorization Type eval: 06/24/23    PT Start Time 1315    PT Stop Time 1400    PT Time Calculation (min) 45 min    Equipment Utilized During Treatment Gait belt    Activity Tolerance Patient tolerated treatment well    Behavior During Therapy WFL for tasks assessed/performed            Past Medical History:  Diagnosis Date   Anemia    in the past   Anxiety    Arthritis    all over   Cervical spondylosis with myelopathy 2018   Chronic pain 2019   can't stand straight, uses walker for stability   DDD (degenerative disc disease), lumbar    Depression    Displacement of cervical intervertebral disc 2018   Dyspnea    HANDICAPPED   Fibromyalgia    Polymyalgia Rheumatica   GERD (gastroesophageal reflux disease)    Heart murmur    not treated or being followed   Hypertension    CONTROLLED   Hypothyroidism    Lumbar post-laminectomy syndrome 2018   Lumbosacral radiculitis 2018   Neuropathy due to medical condition (HCC)    feet, toes, fingers   Osteoarthritis    Osteoporosis    Other long term (current) drug therapy 2019   from pain management   Pain in the coccyx 2018   disorder of sacrum   Sleep apnea    Spondylolisthesis 2018   Spondylosis of lumbosacral region without myelopathy or radiculopathy 2018   Spondylosis with myelopathy, lumbar region 2018   Vitamin D deficiency    Past Surgical History:  Procedure Laterality Date   ABDOMINAL HYSTERECTOMY  2000   APPENDECTOMY     arm surgery  2002   BREAST BIOPSY Left 2005   benign   CATARACT EXTRACTION W/PHACO Right 10/21/2017   Procedure: CATARACT EXTRACTION PHACO AND INTRAOCULAR LENS PLACEMENT (IOC) RIGHT;  Surgeon:  Lockie Mola, MD;  Location: [facility];  Service: Ophthalmology;  Laterality: Right;  neck issues   CESAREAN SECTION     x 2   CHOLECYSTECTOMY     COLONOSCOPY WITH PROPOFOL N/A 08/25/2017   Procedure: COLONOSCOPY WITH PROPOFOL;  Surgeon: Toledo, Boykin Nearing, MD;  Location: [facility];  Service: Gastroenterology;  Laterality: N/A;   COLONOSCOPY WITH PROPOFOL N/A 02/25/2022   Procedure: COLONOSCOPY WITH PROPOFOL;  Surgeon: Regis Bill, MD;  Location: [facility];  Service: Endoscopy;  Laterality: N/A;   epidural injection  2018   steroids   GASTRIC BYPASS  2004   JOINT REPLACEMENT Bilateral 2005   knees   LUMBAR FUSION  2016   x 2/ L3-5   SHOULDER ARTHROSCOPY WITH ROTATOR CUFF REPAIR Right 2008   x 3   SPINAL CORD STIMULATOR IMPLANT  2018   [employer]   TEMPORAL ARTERY BIOPSY / LIGATION     TONSILLECTOMY     Patient Active Problem List   Diagnosis Date Noted   Insomnia due to medical condition 09/16/2022   Bilateral carpal tunnel syndrome 02/12/2022   At risk for prolonged QT interval syndrome 01/16/2022   Psychosis (HCC) 01/16/2022   Flatus 08/27/2021   Diarrhea due  to malabsorption 08/27/2021   Myoclonic jerking 08/06/2021   Polyneuropathy, unspecified 04/28/2021   Personal history of nicotine dependence 04/28/2021   Obstructive sleep apnea of adult 04/28/2021   Opioid type dependence, continuous (HCC) 03/13/2020   Spinal stenosis of lumbosacral region 03/13/2020   MDD (major depressive disorder), recurrent, in full remission (HCC) 12/29/2019   Hypersomnia 12/29/2019   MDD (major depressive disorder), recurrent, in partial remission (HCC) 10/27/2019   GAD (generalized anxiety disorder) 01/11/2019   Bereavement 01/11/2019   Opioid use with withdrawal (HCC) 01/11/2019   Pain syndrome, chronic 09/21/2018   Low serum vitamin B12 09/15/2018   Moderate episode of recurrent major depressive disorder (HCC) 09/15/2018   Memory loss 05/12/2018    Chronic pain of both shoulders 05/12/2018   Osteoporosis, post-menopausal 12/02/2017   Vitamin D deficiency, unspecified 07/22/2017   Candidiasis of skin and nails 07/13/2017   Menopausal symptom 07/13/2017   Bilateral hand pain 06/26/2017   CRP elevated 06/26/2017   Jaw pain, non-TMJ 06/26/2017   Temporal headache 06/26/2017   Stiffness of shoulder joint 06/26/2017   Screening for osteoporosis 06/26/2017   SOBOE (shortness of breath on exertion) 06/09/2017   Joint pain 05/20/2017   Arthritis 05/20/2017   History of lumbar fusion 04/23/2017   Lumbar degenerative disc disease 04/23/2017   Fibromyalgia 04/23/2017   Cervicalgia 04/23/2017   DDD (degenerative disc disease), cervical 04/23/2017   Chronic pain syndrome 04/23/2017   Spinal cord stimulator status 04/23/2017   Complete tear of right rotator cuff 04/17/2017   Rotator cuff tendinitis, right 04/17/2017   Rotator cuff arthropathy of both shoulders 04/12/2017   Degenerative joint disease of cervical and lumbar spine 03/17/2017   Generalized osteoarthritis of multiple sites 03/17/2017   Seasonal allergies 03/17/2017   GERD without esophagitis 01/18/2017   Hypothyroidism 01/18/2017   Fatty liver 12/08/2016   Osteoporosis 09/30/2016   Pain in the coccyx 08/19/2016   Delusional disorder (HCC) 05/27/2016   Essential hypertension 04/09/2016   Chiari malformation type I (HCC) 03/24/2016   Severe obesity (BMI >= 40) (HCC) 03/24/2016   Lumbosacral spondylosis without myelopathy 09/17/2015   Chronic midline low back pain 03/28/2015   Lumbosacral radiculitis 03/05/2015   Morgellons disease 12/01/2014   Medication-induced delirium, acute, hyperactive 11/17/2014   Fatigue 05/08/2014   Lumbar post-laminectomy syndrome 05/08/2014   Lumbar radiculopathy 05/08/2014   Cervical radiculopathy 05/08/2014   H/O gastric bypass 03/03/2014   Dysphagia, oropharyngeal phase 05/18/2013   Acquired spondylolisthesis 08/23/2012   Nonunion of  fracture 08/23/2012   Spinal stenosis of lumbar region without neurogenic claudication 08/23/2012   Dry eyes, bilateral 07/26/2012   Leg cramps 07/26/2012   Diastolic dysfunction 06/14/2012   Lumbar spondylosis 04/06/2012   Lumbar pseudoarthrosis 04/06/2012   Iron deficiency 01/14/2012   Chronic pain 12/16/2011   Edema 12/16/2011   Internal hemorrhoid 12/16/2011   OA (osteoarthritis) 12/16/2011    PCP: Wilford Corner, PA-C  REFERRING PROVIDER: Janice Coffin, PA-C   REFERRING DIAG: Imbalance  RATIONALE FOR EVALUATION AND TREATMENT: Rehabilitation  THERAPY DIAG: Unsteadiness on feet  Muscle weakness (generalized)  ONSET DATE: Chronic; "a couple years"  FOLLOW-UP APPT SCHEDULED WITH REFERRING PROVIDER: No    SUBJECTIVE:  SUBJECTIVE STATEMENT:  Imbalance and unsteadiness  PERTINENT HISTORY: Pt reports to PT today c/c imbalance beginning "a couple years ago." Pt began noticing imbalance prior to any falls, but has experienced a few until beginning use of rollator. She states "holding onto something helps to keep my balance." Her husband has Parkinson's and struggles to help her around the house. She has a son and 2 grandsons that live a few blocks away, but she does not see them often. Pt notes they will ocme over to help if she is specific about the task and time. Pt notes sciatica pain in LLE that doesn't go away, about 6 mos ongoing. Pt reports hx of BLE knee replacement surgery and further notes shoulder pain. Pt has had 3 shoulder surgeries to repair a torn rotator cuff in the RUE. Pt reports tearing again, MD advised BUE reverse shoulder arthroplasty, but pt has decided against due to demands of rehabilitation and necessity for independence at home. She further notes back pain beginning a few  years ago. Pt notes she underwent an L4-5 fusion, but had to undergo a second surgery after the first one failed. Pt reports second surgery included L3-L5, but also failed to fuse. Pt notes still experiencing back pain, and later underwent surgery for a spinal cord stimulator. She notes still having it, however it will not charge so it is unusable as fixing it would require another surgery. Pt notes that MD had previously advised against the surgery due to pt's weight at the time. She has now lost enough weight, but has decided against the surgery because she "feels scared, too old, and wants to see if PT can help first."  Pain: Yes Numbness/Tingling: Yes; down LLE, N/T into toes RLE Focal Weakness: Yes Recent changes in overall health/medication: No; see hx, taking several medications Prior history of physical therapy for balance: No; prior PT for back pain Dominant hand: right Imaging: Yes  Red flags: Negative for bowel/bladder changes, saddle paresthesia, personal history of cancer, h/o spinal tumors, h/o compression fx, h/o abdominal aneurysm, abdominal pain, chills/fever, night sweats, nausea, vomiting  PRECAUTIONS: None  WEIGHT BEARING RESTRICTIONS: No  FALLS: Has patient fallen in last 6 months? 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  Recent memory is intact.  Remote memory is intact.  Attention span and concentration are intact.  Expressive speech is intact.  Patient's fund of knowledge is within normal limits for educational level.    Gross Musculoskeletal Assessment Tremor: None Bulk: Normal Tone: Normal  Posture: Formal assessment deferred  AROM Formal assessment deferred; grossly WNL for LE  LE MMT: MMT (out of 5) Right  Left   Hip flexion  4+ 4+  Hip extension    Hip abduction    Hip adduction    Hip internal rotation    Hip external rotation    Knee flexion 5 (seated position) 5 (seated position)  Knee extension 5 4+*  Ankle dorsiflexion 4* 5  Ankle plantarflexion WNL WNL  Ankle inversion    Ankle eversion    (* = pain; Blank rows = not tested)  Sensation Diminished to light touch on LLE L1 and S2. Proprioception, stereognosis, and hot/cold testing deferred on this date.  Reflexes Deferred  Cranial Nerves Deferred  Coordination/Cerebellar Deferred  Bed mobility: Deferred  Transfers: Assistive device utilized:  Rollator   Sit to stand: Modified independence Stand to sit: Modified independence Chair to chair: Modified independence Floor:  Deferred  Curb:  Deferred  Stairs: Deferred  Gait: Formal gait assessment deferred.   Functional Outcome Measures  Results Comments  BERG    DGI    FGA    TUG 10.56 seconds Using rollator  5TSTS 19.87 seconds Increased fall risk  6 Minute Walk Test    10 Meter Gait Speed Self-selected: 14.47s = 0.69 m/s Using rollator  (Blank rows = not tested)  06/29/23 ABC: 56.9% BERG: 46/56; mCTSIB: 30s in conditions 1-4 with 3+ sway in condition 4;   TODAY'S TREATMENT    SUBJECTIVE: Pt states that she is  doing OK today. She didn't sleep at all last night and has noticed increased swelling in BLE today. She has been out of her Lasix for the last couple days and states that she has to see her MD before they will refill. Denies pertinent acute pain but continues with ongoing chronic pain. No specific questions or concerns.    PAIN: Ongoing chronic pain;   Therapeutic Activity NuStep L1-2 x 8 minutes for BLE strengthening and warm-up during interval history (3 minutes unbilled); Sit to stand from regular height chair (18") 2 x 10;  Standing exercises with 3# AW: Hip flexion marches x 10 BLE; Hip abduction x 10 BLE; Hamstring curls x 10 BLE; Hip extension x 10 BLE;  Seated LAQ with 3# AW x 10 BLE;   Neuromuscular Re-education  Airex tandem balance alternating forward LE in // bars without UE support x 60s each; Airex tandem balance alternating forward LE in // bars without UE support with horizontal and vertical head turns x 30s each BLE; Airex beam tandem gait in // bars without UE support x multiple lengths; Airex balance beam side stepping x multiple lengths;   Not performed: Airex feet together eyes open/closed x 30s each; Airex feet together horizontal and vertical head turns x 30s each;    PATIENT EDUCATION:  Education details: Pt educated throughout session about proper posture and technique with exercises. Improved exercise technique, movement at target joints, use of target muscles after min to mod verbal, visual, tactile cues.  Person educated: Patient Education method: Explanation Education comprehension: verbalized understanding   HOME EXERCISE PROGRAM:  Access Code: [email or web address] URL: [email or web address] Date: 06/29/2023 Prepared by: Ria Comment  Exercises - Sit to Stand Without Arm Support  - 1 x daily - 7 x weekly - 3 sets - 10 reps - Heel Raises with Counter Support  - 1 x daily - 7 x weekly - 3 sets - 10 reps - 3s hold - Standing Romberg to 1/2  Tandem Stance  - 1 x daily - 7 x weekly - 4 reps - 60s hold   ASSESSMENT:  CLINICAL IMPRESSION: Progressed balance and strength  exercises during session today with patient. Introduced Airex balance beam and tandem balance is difficult for her. No HEP updates on this date. Pt encouraged to follow-up as scheduled. She will benefit from PT services to address deficits in weakness, balance, and mobility in order to return to full function at home as well as decrease risk for falls.   OBJECTIVE IMPAIRMENTS: decreased activity tolerance, decreased balance, decreased coordination, and postural dysfunction.   ACTIVITY LIMITATIONS: carrying, sitting, standing, squatting, stairs, and transfers  PARTICIPATION LIMITATIONS: community activity and occupation  PERSONAL FACTORS: Age, Fitness, Past/current experiences, Time since onset of injury/illness/exacerbation, and 3+ comorbidities: lumbar and cervical radiculopathy, polyneuropathy, DDD, osteoporosis, OA, spondylosis, hx of lumbar fusion, chronic pain syndrome  are also affecting patient's functional outcome.   REHAB POTENTIAL: Fair    CLINICAL DECISION MAKING: Unstable/unpredictable  EVALUATION COMPLEXITY: High   GOALS: Goals reviewed with patient? No  SHORT TERM GOALS: Target date: 07/08/23  Pt will be independent with HEP in order to improve strength and balance in order to decrease fall risk and improve function at home. Baseline:  Goal status: INITIAL   LONG TERM GOALS: Target date: 09/16/23  1.  Pt will improve BERG by at least 3 points in order to demonstrate clinically significant improvement in balance.   Baseline: 06/29/23: Goal status: INITIAL  2.  Pt will improve ABC by at least 13% in order to demonstrate clinically significant improvement in balance confidence.      Baseline: 06/29/23: 56.9% Goal status: INITIAL  3. Pt will decrease 5TSTS by at least 3 seconds in order to demonstrate clinically significant improvement in LE  strength      Baseline: 19.87s Goal status: INITIAL  4. Pt will increase self-selected by at least 0.13 m/s in order to demonstrate clinically significant improvement in community ambulation.      Baseline: Self-selected: 14.47s = 0.69 m/s Goal status: INITIAL   PLAN: PT FREQUENCY: 2x/week  PT DURATION: 8 weeks  PLANNED INTERVENTIONS: Therapeutic exercises, Therapeutic activity, Neuromuscular re-education, Balance training, Gait training, Patient/Family education, Self Care, Joint mobilization, Joint manipulation, Vestibular training, Canalith repositioning, Orthotic/Fit training, DME instructions, Dry Needling, Electrical stimulation, Spinal manipulation, Spinal mobilization, Cryotherapy, Moist heat, Taping, Traction, Ultrasound, Ionotophoresis 4mg /ml Dexamethasone, Manual therapy, and Re-evaluation.  PLAN FOR NEXT SESSION: progress strength and balance exercises, reviewe/modify HEP as necessary.   Sharalyn Ink Perlie Stene PT, DPT, GCS  Kaelynn Igo 07/08/2023, 2:41 PM

## 2023-07-08 ENCOUNTER — Ambulatory Visit: Payer: Medicare Other

## 2023-07-08 DIAGNOSIS — R2681 Unsteadiness on feet: Secondary | ICD-10-CM

## 2023-07-08 DIAGNOSIS — M6281 Muscle weakness (generalized): Secondary | ICD-10-CM

## 2023-07-13 ENCOUNTER — Ambulatory Visit: Payer: Medicare Other

## 2023-07-13 DIAGNOSIS — R2681 Unsteadiness on feet: Secondary | ICD-10-CM | POA: Diagnosis not present

## 2023-07-13 DIAGNOSIS — M6281 Muscle weakness (generalized): Secondary | ICD-10-CM

## 2023-07-13 DIAGNOSIS — R262 Difficulty in walking, not elsewhere classified: Secondary | ICD-10-CM

## 2023-07-13 NOTE — Therapy (Signed)
 OUTPATIENT PHYSICAL THERAPY BALANCE TREATMENT  Patient Name: Connie [name] MRN: [id number] DOB:[birth year], 68 y.o., female Today's Date: 07/13/2023  END OF SESSION:  PT End of Session - 07/13/23 1320     Visit Number 4    Number of Visits 25    Date for PT Re-Evaluation 09/16/23    Authorization Type eval: 06/24/23    PT Start Time 1320    PT Stop Time 1400    PT Time Calculation (min) 40 min    Equipment Utilized During Treatment Gait belt    Activity Tolerance Patient tolerated treatment well    Behavior During Therapy WFL for tasks assessed/performed             Past Medical History:  Diagnosis Date   Anemia    in the past   Anxiety    Arthritis    all over   Cervical spondylosis with myelopathy 2018   Chronic pain 2019   can't stand straight, uses walker for stability   DDD (degenerative disc disease), lumbar    Depression    Displacement of cervical intervertebral disc 2018   Dyspnea    HANDICAPPED   Fibromyalgia    Polymyalgia Rheumatica   GERD (gastroesophageal reflux disease)    Heart murmur    not treated or being followed   Hypertension    CONTROLLED   Hypothyroidism    Lumbar post-laminectomy syndrome 2018   Lumbosacral radiculitis 2018   Neuropathy due to medical condition (HCC)    feet, toes, fingers   Osteoarthritis    Osteoporosis    Other long term (current) drug therapy 2019   from pain management   Pain in the coccyx 2018   disorder of sacrum   Sleep apnea    Spondylolisthesis 2018   Spondylosis of lumbosacral region without myelopathy or radiculopathy 2018   Spondylosis with myelopathy, lumbar region 2018   Vitamin D deficiency    Past Surgical History:  Procedure Laterality Date   ABDOMINAL HYSTERECTOMY  2000   APPENDECTOMY     arm surgery  2002   BREAST BIOPSY Left 2005   benign   CATARACT EXTRACTION W/PHACO Right 10/21/2017   Procedure: CATARACT EXTRACTION PHACO AND INTRAOCULAR LENS PLACEMENT (IOC) RIGHT;  Surgeon:  Lockie Mola, MD;  Location: [facility];  Service: Ophthalmology;  Laterality: Right;  neck issues   CESAREAN SECTION     x 2   CHOLECYSTECTOMY     COLONOSCOPY WITH PROPOFOL N/A 08/25/2017   Procedure: COLONOSCOPY WITH PROPOFOL;  Surgeon: Toledo, Boykin Nearing, MD;  Location: [facility];  Service: Gastroenterology;  Laterality: N/A;   COLONOSCOPY WITH PROPOFOL N/A 02/25/2022   Procedure: COLONOSCOPY WITH PROPOFOL;  Surgeon: Regis Bill, MD;  Location: [facility];  Service: Endoscopy;  Laterality: N/A;   epidural injection  2018   steroids   GASTRIC BYPASS  2004   JOINT REPLACEMENT Bilateral 2005   knees   LUMBAR FUSION  2016   x 2/ L3-5   SHOULDER ARTHROSCOPY WITH ROTATOR CUFF REPAIR Right 2008   x 3   SPINAL CORD STIMULATOR IMPLANT  2018   [employer]   TEMPORAL ARTERY BIOPSY / LIGATION     TONSILLECTOMY     Patient Active Problem List   Diagnosis Date Noted   Insomnia due to medical condition 09/16/2022   Bilateral carpal tunnel syndrome 02/12/2022   At risk for prolonged QT interval syndrome 01/16/2022   Psychosis (HCC) 01/16/2022   Flatus 08/27/2021   Diarrhea  due to malabsorption 08/27/2021   Myoclonic jerking 08/06/2021   Polyneuropathy, unspecified 04/28/2021   Personal history of nicotine dependence 04/28/2021   Obstructive sleep apnea of adult 04/28/2021   Opioid type dependence, continuous (HCC) 03/13/2020   Spinal stenosis of lumbosacral region 03/13/2020   MDD (major depressive disorder), recurrent, in full remission (HCC) 12/29/2019   Hypersomnia 12/29/2019   MDD (major depressive disorder), recurrent, in partial remission (HCC) 10/27/2019   GAD (generalized anxiety disorder) 01/11/2019   Bereavement 01/11/2019   Opioid use with withdrawal (HCC) 01/11/2019   Pain syndrome, chronic 09/21/2018   Low serum vitamin B12 09/15/2018   Moderate episode of recurrent major depressive disorder (HCC) 09/15/2018   Memory loss 05/12/2018    Chronic pain of both shoulders 05/12/2018   Osteoporosis, post-menopausal 12/02/2017   Vitamin D deficiency, unspecified 07/22/2017   Candidiasis of skin and nails 07/13/2017   Menopausal symptom 07/13/2017   Bilateral hand pain 06/26/2017   CRP elevated 06/26/2017   Jaw pain, non-TMJ 06/26/2017   Temporal headache 06/26/2017   Stiffness of shoulder joint 06/26/2017   Screening for osteoporosis 06/26/2017   SOBOE (shortness of breath on exertion) 06/09/2017   Joint pain 05/20/2017   Arthritis 05/20/2017   History of lumbar fusion 04/23/2017   Lumbar degenerative disc disease 04/23/2017   Fibromyalgia 04/23/2017   Cervicalgia 04/23/2017   DDD (degenerative disc disease), cervical 04/23/2017   Chronic pain syndrome 04/23/2017   Spinal cord stimulator status 04/23/2017   Complete tear of right rotator cuff 04/17/2017   Rotator cuff tendinitis, right 04/17/2017   Rotator cuff arthropathy of both shoulders 04/12/2017   Degenerative joint disease of cervical and lumbar spine 03/17/2017   Generalized osteoarthritis of multiple sites 03/17/2017   Seasonal allergies 03/17/2017   GERD without esophagitis 01/18/2017   Hypothyroidism 01/18/2017   Fatty liver 12/08/2016   Osteoporosis 09/30/2016   Pain in the coccyx 08/19/2016   Delusional disorder (HCC) 05/27/2016   Essential hypertension 04/09/2016   Chiari malformation type I (HCC) 03/24/2016   Severe obesity (BMI >= 40) (HCC) 03/24/2016   Lumbosacral spondylosis without myelopathy 09/17/2015   Chronic midline low back pain 03/28/2015   Lumbosacral radiculitis 03/05/2015   Morgellons disease 12/01/2014   Medication-induced delirium, acute, hyperactive 11/17/2014   Fatigue 05/08/2014   Lumbar post-laminectomy syndrome 05/08/2014   Lumbar radiculopathy 05/08/2014   Cervical radiculopathy 05/08/2014   H/O gastric bypass 03/03/2014   Dysphagia, oropharyngeal phase 05/18/2013   Acquired spondylolisthesis 08/23/2012   Nonunion of  fracture 08/23/2012   Spinal stenosis of lumbar region without neurogenic claudication 08/23/2012   Dry eyes, bilateral 07/26/2012   Leg cramps 07/26/2012   Diastolic dysfunction 06/14/2012   Lumbar spondylosis 04/06/2012   Lumbar pseudoarthrosis 04/06/2012   Iron deficiency 01/14/2012   Chronic pain 12/16/2011   Edema 12/16/2011   Internal hemorrhoid 12/16/2011   OA (osteoarthritis) 12/16/2011    PCP: Wilford Corner, PA-C  REFERRING PROVIDER: Janice Coffin, PA-C   REFERRING DIAG: Imbalance  RATIONALE FOR EVALUATION AND TREATMENT: Rehabilitation  THERAPY DIAG: Difficulty in walking, not elsewhere classified  Muscle weakness (generalized)  Unsteadiness on feet  ONSET DATE: Chronic; "a couple years"  FOLLOW-UP APPT SCHEDULED WITH REFERRING PROVIDER: No    SUBJECTIVE:  SUBJECTIVE STATEMENT:  Imbalance and unsteadiness  PERTINENT HISTORY: Pt reports to PT today c/c imbalance beginning "a couple years ago." Pt began noticing imbalance prior to any falls, but has experienced a few until beginning use of rollator. She states "holding onto something helps to keep my balance." Her husband has Parkinson's and struggles to help her around the house. She has a son and 2 grandsons that live a few blocks away, but she does not see them often. Pt notes they will ocme over to help if she is specific about the task and time. Pt notes sciatica pain in LLE that doesn't go away, about 6 mos ongoing. Pt reports hx of BLE knee replacement surgery and further notes shoulder pain. Pt has had 3 shoulder surgeries to repair a torn rotator cuff in the RUE. Pt reports tearing again, MD advised BUE reverse shoulder arthroplasty, but pt has decided against due to demands of rehabilitation and necessity for independence  at home. She further notes back pain beginning a few years ago. Pt notes she underwent an L4-5 fusion, but had to undergo a second surgery after the first one failed. Pt reports second surgery included L3-L5, but also failed to fuse. Pt notes still experiencing back pain, and later underwent surgery for a spinal cord stimulator. She notes still having it, however it will not charge so it is unusable as fixing it would require another surgery. Pt notes that MD had previously advised against the surgery due to pt's weight at the time. She has now lost enough weight, but has decided against the surgery because she "feels scared, too old, and wants to see if PT can help first."  Pain: Yes Numbness/Tingling: Yes; down LLE, N/T into toes RLE Focal Weakness: Yes Recent changes in overall health/medication: No; see hx, taking several medications Prior history of physical therapy for balance: No; prior PT for back pain Dominant hand: right Imaging: Yes  Red flags: Negative for bowel/bladder changes, saddle paresthesia, personal history of cancer, h/o spinal tumors, h/o compression fx, h/o abdominal aneurysm, abdominal pain, chills/fever, night sweats, nausea, vomiting  PRECAUTIONS: None  WEIGHT BEARING RESTRICTIONS: No  FALLS: Has patient fallen in last 6 months? 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  Recent memory is intact.  Remote memory is intact.  Attention span and concentration are intact.  Expressive speech is intact.  Patient's fund of knowledge is within normal limits for educational level.    Gross Musculoskeletal Assessment Tremor: None Bulk: Normal Tone: Normal  Posture: Formal assessment deferred  AROM Formal assessment deferred; grossly WNL for LE  LE MMT: MMT (out of 5) Right  Left   Hip flexion  4+ 4+  Hip extension    Hip abduction    Hip adduction    Hip internal rotation    Hip external rotation    Knee flexion 5 (seated position) 5 (seated position)  Knee extension 5 4+*  Ankle dorsiflexion 4* 5  Ankle plantarflexion WNL WNL  Ankle inversion    Ankle eversion    (* = pain; Blank rows = not tested)  Sensation Diminished to light touch on LLE L1 and S2. Proprioception, stereognosis, and hot/cold testing deferred on this date.  Reflexes Deferred  Cranial Nerves Deferred  Coordination/Cerebellar Deferred  Bed mobility: Deferred  Transfers: Assistive device utilized:  Rollator   Sit to stand: Modified independence Stand to sit: Modified independence Chair to chair: Modified independence Floor:  Deferred  Curb:  Deferred  Stairs: Deferred  Gait: Formal gait assessment deferred.   Functional Outcome Measures  Results Comments  BERG    DGI    FGA    TUG 10.56 seconds Using rollator  5TSTS 19.87 seconds Increased fall risk  6 Minute Walk Test    10 Meter Gait Speed Self-selected: 14.47s = 0.69 m/s Using rollator  (Blank rows = not tested)  06/29/23 ABC: 56.9% BERG: 46/56; mCTSIB: 30s in conditions 1-4 with 3+ sway in condition  4;   TODAY'S TREATMENT    SUBJECTIVE: Pt states that she is doing OK today. Could not sleep again due to spouse health problems. Denies pertinent acute pain but continues with ongoing chronic pain. No specific questions or concerns.    PAIN: Ongoing chronic pain;   Therapeutic Activity NuStep L1-3x 8 minutes for BLE strengthening and warm-up during interval history  Seated LAQ with GTB 2 x 10 BLE; Standing exercises with 3# AW: Hip flexion marches with GTB 2 x x 10 BLE; Hip abduction woth GTB 2 x 10 BLE; Hamstring curls with GTB x 10 BLE; Hip extension GTB 2x 10 BLE; Sit to stand with GTB  from regular height chair (18") 2 x 10; Side stepping with GTB x 60 secs Backward stepping with GTB x 60 secs.    Not today Neuromuscular Re-education  Airex tandem balance alternating forward LE in // bars without UE support x 60s each; Airex tandem balance alternating forward LE in // bars without UE support with horizontal and vertical head turns x 30s each BLE; Airex beam tandem gait in // bars without UE support x multiple lengths; Airex balance beam side stepping x multiple lengths;   Not performed: Airex feet together eyes open/closed x 30s each; Airex feet together horizontal and vertical head turns x 30s each;    PATIENT EDUCATION:  Education details: Pt educated throughout session about proper posture and technique with exercises. Improved exercise technique, movement at target joints, use of target muscles after min to mod verbal, visual, tactile cues.  Person educated: Patient Education method: Explanation Education comprehension: verbalized understanding   HOME EXERCISE PROGRAM:  Access Code: [email or web address] URL: [email or web address] Date: 06/29/2023 Prepared by: Ria Comment  Exercises - Sit to Stand Without Arm Support  - 1 x daily - 7 x weekly - 3 sets - 10 reps - Heel Raises with Counter Support  - 1 x daily - 7 x weekly - 3 sets - 10 reps - 3s hold -  Standing Romberg to 1/2 Tandem Stance  - 1 x daily - 7 x weekly - 4 reps - 60s hold   ASSESSMENT:  CLINICAL IMPRESSION: Pt tol tx well. Tieres easily. Pt challenged with BLE  strengthening and balance activities. No HEP updates on this date. Pt encouraged to follow-up as scheduled. She will benefit from PT services to address deficits in weakness, balance, and mobility in order to return to full function at home as well as decrease risk for falls.   OBJECTIVE IMPAIRMENTS: decreased activity tolerance, decreased balance, decreased coordination, and postural dysfunction.   ACTIVITY LIMITATIONS: carrying, sitting, standing, squatting, stairs, and transfers  PARTICIPATION LIMITATIONS: community activity and occupation  PERSONAL FACTORS: Age, Fitness, Past/current experiences, Time since onset of injury/illness/exacerbation, and 3+ comorbidities: lumbar and cervical radiculopathy, polyneuropathy, DDD, osteoporosis, OA, spondylosis, hx of lumbar fusion, chronic pain syndrome  are also affecting patient's functional outcome.   REHAB POTENTIAL: Fair    CLINICAL DECISION MAKING: Unstable/unpredictable  EVALUATION COMPLEXITY: High   GOALS: Goals reviewed with patient? No  SHORT TERM GOALS: Target date: 07/08/23  Pt will be independent with HEP in order to improve strength and balance in order to decrease fall risk and improve function at home. Baseline:  Goal status: INITIAL   LONG TERM GOALS: Target date: 09/16/23  1.  Pt will improve BERG by at least 3 points in order to demonstrate clinically significant improvement in balance.   Baseline: 06/29/23: Goal status: INITIAL  2.  Pt will improve ABC by at least 13% in order to demonstrate clinically significant improvement in balance confidence.      Baseline: 06/29/23: 56.9% Goal status: INITIAL  3. Pt will decrease 5TSTS by at least 3 seconds in order to demonstrate clinically significant improvement in LE strength      Baseline:  19.87s Goal status: INITIAL  4. Pt will increase self-selected by at least 0.13 m/s in order to demonstrate clinically significant improvement in community ambulation.      Baseline: Self-selected: 14.47s = 0.69 m/s Goal status: INITIAL   PLAN: PT FREQUENCY: 2x/week  PT DURATION: 8 weeks  PLANNED INTERVENTIONS: Therapeutic exercises, Therapeutic activity, Neuromuscular re-education, Balance training, Gait training, Patient/Family education, Self Care, Joint mobilization, Joint manipulation, Vestibular training, Canalith repositioning, Orthotic/Fit training, DME instructions, Dry Needling, Electrical stimulation, Spinal manipulation, Spinal mobilization, Cryotherapy, Moist heat, Taping, Traction, Ultrasound, Ionotophoresis 4mg /ml Dexamethasone, Manual therapy, and Re-evaluation.  PLAN FOR NEXT SESSION: progress strength and balance exercises, reviewe/modify HEP as necessary.     Mckenzi Buonomo H Darlette Dubow 07/13/2023, 2:57 PM

## 2023-07-14 ENCOUNTER — Encounter

## 2023-07-14 DIAGNOSIS — G4733 Obstructive sleep apnea (adult) (pediatric): Secondary | ICD-10-CM

## 2023-07-14 NOTE — Therapy (Signed)
 OUTPATIENT PHYSICAL THERAPY BALANCE TREATMENT  Patient Name: Connie [name] MRN: [id number] DOB:1956/03/29, 68 y.o., female Today's Date: 07/15/2023  END OF SESSION:  PT End of Session - 07/15/23 1312     Visit Number 5    Number of Visits 25    Date for PT Re-Evaluation 09/16/23    Authorization Type eval: 06/24/23    PT Start Time 1315    PT Stop Time 1356    PT Time Calculation (min) 41 min    Equipment Utilized During Treatment Gait belt    Activity Tolerance Patient tolerated treatment well    Behavior During Therapy WFL for tasks assessed/performed              Past Medical History:  Diagnosis Date   Anemia    in the past   Anxiety    Arthritis    all over   Cervical spondylosis with myelopathy 2018   Chronic pain 2019   can't stand straight, uses Jibril Mcminn for stability   DDD (degenerative disc disease), lumbar    Depression    Displacement of cervical intervertebral disc 2018   Dyspnea    HANDICAPPED   Fibromyalgia    Polymyalgia Rheumatica   GERD (gastroesophageal reflux disease)    Heart murmur    not treated or being followed   Hypertension    CONTROLLED   Hypothyroidism    Lumbar post-laminectomy syndrome 2018   Lumbosacral radiculitis 2018   Neuropathy due to medical condition (HCC)    feet, toes, fingers   Osteoarthritis    Osteoporosis    Other long term (current) drug therapy 2019   from pain management   Pain in the coccyx 2018   disorder of sacrum   Sleep apnea    Spondylolisthesis 2018   Spondylosis of lumbosacral region without myelopathy or radiculopathy 2018   Spondylosis with myelopathy, lumbar region 2018   Vitamin D deficiency    Past Surgical History:  Procedure Laterality Date   ABDOMINAL HYSTERECTOMY  2000   APPENDECTOMY     arm surgery  2002   BREAST BIOPSY Left 2005   benign   CATARACT EXTRACTION W/PHACO Right 10/21/2017   Procedure: CATARACT EXTRACTION PHACO AND INTRAOCULAR LENS PLACEMENT (IOC) RIGHT;  Surgeon:  Lockie Mola, MD;  Location: [facility];  Service: Ophthalmology;  Laterality: Right;  neck issues   CESAREAN SECTION     x 2   CHOLECYSTECTOMY     COLONOSCOPY WITH PROPOFOL N/A 08/25/2017   Procedure: COLONOSCOPY WITH PROPOFOL;  Surgeon: Toledo, Boykin Nearing, MD;  Location: [facility];  Service: Gastroenterology;  Laterality: N/A;   COLONOSCOPY WITH PROPOFOL N/A 02/25/2022   Procedure: COLONOSCOPY WITH PROPOFOL;  Surgeon: Regis Bill, MD;  Location: [facility];  Service: Endoscopy;  Laterality: N/A;   epidural injection  2018   steroids   GASTRIC BYPASS  2004   JOINT REPLACEMENT Bilateral 2005   knees   LUMBAR FUSION  2016   x 2/ L3-5   SHOULDER ARTHROSCOPY WITH ROTATOR CUFF REPAIR Right 2008   x 3   SPINAL CORD STIMULATOR IMPLANT  2018   [employer]   TEMPORAL ARTERY BIOPSY / LIGATION     TONSILLECTOMY     Patient Active Problem List   Diagnosis Date Noted   Insomnia due to medical condition 09/16/2022   Bilateral carpal tunnel syndrome 02/12/2022   At risk for prolonged QT interval syndrome 01/16/2022   Psychosis (HCC) 01/16/2022   Flatus 08/27/2021  Diarrhea due to malabsorption 08/27/2021   Myoclonic jerking 08/06/2021   Polyneuropathy, unspecified 04/28/2021   Personal history of nicotine dependence 04/28/2021   Obstructive sleep apnea of adult 04/28/2021   Opioid type dependence, continuous (HCC) 03/13/2020   Spinal stenosis of lumbosacral region 03/13/2020   MDD (major depressive disorder), recurrent, in full remission (HCC) 12/29/2019   Hypersomnia 12/29/2019   MDD (major depressive disorder), recurrent, in partial remission (HCC) 10/27/2019   GAD (generalized anxiety disorder) 01/11/2019   Bereavement 01/11/2019   Opioid use with withdrawal (HCC) 01/11/2019   Pain syndrome, chronic 09/21/2018   Low serum vitamin B12 09/15/2018   Moderate episode of recurrent major depressive disorder (HCC) 09/15/2018   Memory loss 05/12/2018    Chronic pain of both shoulders 05/12/2018   Osteoporosis, post-menopausal 12/02/2017   Vitamin D deficiency, unspecified 07/22/2017   Candidiasis of skin and nails 07/13/2017   Menopausal symptom 07/13/2017   Bilateral hand pain 06/26/2017   CRP elevated 06/26/2017   Jaw pain, non-TMJ 06/26/2017   Temporal headache 06/26/2017   Stiffness of shoulder joint 06/26/2017   Screening for osteoporosis 06/26/2017   SOBOE (shortness of breath on exertion) 06/09/2017   Joint pain 05/20/2017   Arthritis 05/20/2017   History of lumbar fusion 04/23/2017   Lumbar degenerative disc disease 04/23/2017   Fibromyalgia 04/23/2017   Cervicalgia 04/23/2017   DDD (degenerative disc disease), cervical 04/23/2017   Chronic pain syndrome 04/23/2017   Spinal cord stimulator status 04/23/2017   Complete tear of right rotator cuff 04/17/2017   Rotator cuff tendinitis, right 04/17/2017   Rotator cuff arthropathy of both shoulders 04/12/2017   Degenerative joint disease of cervical and lumbar spine 03/17/2017   Generalized osteoarthritis of multiple sites 03/17/2017   Seasonal allergies 03/17/2017   GERD without esophagitis 01/18/2017   Hypothyroidism 01/18/2017   Fatty liver 12/08/2016   Osteoporosis 09/30/2016   Pain in the coccyx 08/19/2016   Delusional disorder (HCC) 05/27/2016   Essential hypertension 04/09/2016   Chiari malformation type I (HCC) 03/24/2016   Severe obesity (BMI >= 40) (HCC) 03/24/2016   Lumbosacral spondylosis without myelopathy 09/17/2015   Chronic midline low back pain 03/28/2015   Lumbosacral radiculitis 03/05/2015   Morgellons disease 12/01/2014   Medication-induced delirium, acute, hyperactive 11/17/2014   Fatigue 05/08/2014   Lumbar post-laminectomy syndrome 05/08/2014   Lumbar radiculopathy 05/08/2014   Cervical radiculopathy 05/08/2014   H/O gastric bypass 03/03/2014   Dysphagia, oropharyngeal phase 05/18/2013   Acquired spondylolisthesis 08/23/2012   Nonunion of  fracture 08/23/2012   Spinal stenosis of lumbar region without neurogenic claudication 08/23/2012   Dry eyes, bilateral 07/26/2012   Leg cramps 07/26/2012   Diastolic dysfunction 06/14/2012   Lumbar spondylosis 04/06/2012   Lumbar pseudoarthrosis 04/06/2012   Iron deficiency 01/14/2012   Chronic pain 12/16/2011   Edema 12/16/2011   Internal hemorrhoid 12/16/2011   OA (osteoarthritis) 12/16/2011    PCP: Wilford Corner, PA-C  REFERRING PROVIDER: Janice Coffin, PA-C   REFERRING DIAG: Imbalance  RATIONALE FOR EVALUATION AND TREATMENT: Rehabilitation  THERAPY DIAG: Difficulty in walking, not elsewhere classified  Muscle weakness (generalized)  Unsteadiness on feet  Imbalance  ONSET DATE: Chronic; "a couple years"  FOLLOW-UP APPT SCHEDULED WITH REFERRING PROVIDER: No    SUBJECTIVE:  SUBJECTIVE STATEMENT:  Imbalance and unsteadiness  PERTINENT HISTORY: Pt reports to PT today c/c imbalance beginning "a couple years ago." Pt began noticing imbalance prior to any falls, but has experienced a few until beginning use of rollator. She states "holding onto something helps to keep my balance." Her husband has Parkinson's and struggles to help her around the house. She has a son and 2 grandsons that live a few blocks away, but she does not see them often. Pt notes they will ocme over to help if she is specific about the task and time. Pt notes sciatica pain in LLE that doesn't go away, about 6 mos ongoing. Pt reports hx of BLE knee replacement surgery and further notes shoulder pain. Pt has had 3 shoulder surgeries to repair a torn rotator cuff in the RUE. Pt reports tearing again, MD advised BUE reverse shoulder arthroplasty, but pt has decided against due to demands of rehabilitation and necessity for  independence at home. She further notes back pain beginning a few years ago. Pt notes she underwent an L4-5 fusion, but had to undergo a second surgery after the first one failed. Pt reports second surgery included L3-L5, but also failed to fuse. Pt notes still experiencing back pain, and later underwent surgery for a spinal cord stimulator. She notes still having it, however it will not charge so it is unusable as fixing it would require another surgery. Pt notes that MD had previously advised against the surgery due to pt's weight at the time. She has now lost enough weight, but has decided against the surgery because she "feels scared, too old, and wants to see if PT can help first."  Pain: Yes Numbness/Tingling: Yes; down LLE, N/T into toes RLE Focal Weakness: Yes Recent changes in overall health/medication: No; see hx, taking several medications Prior history of physical therapy for balance: No; prior PT for back pain Dominant hand: right Imaging: Yes  Red flags: Negative for bowel/bladder changes, saddle paresthesia, personal history of cancer, h/o spinal tumors, h/o compression fx, h/o abdominal aneurysm, abdominal pain, chills/fever, night sweats, nausea, vomiting  PRECAUTIONS: None  WEIGHT BEARING RESTRICTIONS: No  FALLS: Has patient fallen in last 6 months? 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  Recent memory is intact.  Remote memory is intact.  Attention span and concentration are intact.  Expressive speech is intact.  Patient's fund of knowledge is within normal limits for educational level.    Gross Musculoskeletal Assessment Tremor: None Bulk: Normal Tone: Normal  Posture: Formal assessment deferred  AROM Formal assessment deferred; grossly WNL for LE  LE MMT: MMT (out of 5) Right  Left   Hip flexion  4+ 4+  Hip extension    Hip abduction    Hip adduction    Hip internal rotation    Hip external rotation    Knee flexion 5 (seated position) 5 (seated position)  Knee extension 5 4+*  Ankle dorsiflexion 4* 5  Ankle plantarflexion WNL WNL  Ankle inversion    Ankle eversion    (* = pain; Blank rows = not tested)  Sensation Diminished to light touch on LLE L1 and S2. Proprioception, stereognosis, and hot/cold testing deferred on this date.  Reflexes Deferred  Cranial Nerves Deferred  Coordination/Cerebellar Deferred  Bed mobility: Deferred  Transfers: Assistive device utilized:  Rollator   Sit to stand: Modified independence Stand to sit: Modified independence Chair to chair: Modified independence Floor:  Deferred  Curb:  Deferred  Stairs: Deferred  Gait: Formal gait assessment deferred.   Functional Outcome Measures  Results Comments  BERG    DGI    FGA    TUG 10.56 seconds Using rollator  5TSTS 19.87 seconds Increased fall risk  6 Minute Walk Test    10 Meter Gait Speed Self-selected: 14.47s = 0.69 m/s Using rollator  (Blank rows = not tested)  06/29/23 ABC: 56.9% BERG: 46/56; mCTSIB: 30s in conditions 1-4 with 3+ sway in condition  4;   TODAY'S TREATMENT 07/15/23    SUBJECTIVE: patient reports her back is bothering her this week. She ran out of her Lasix and her ankles are swelling really bad. She has contacted her MD about refilling her Rx.   PAIN: Ongoing chronic pain;   Therapeutic Activity NuStep L1-3x 8 minutes for BLE strengthening and warm-up during interval history  Sit to stand 2 x 10 with no UE support  Dynamic marching with 3# AW in // bars x 2 laps with no UE support  Seated LAQ 3# AW x 15 with 3 second hold each LE   Side stepping with GTB around knees x 2 kaps in // bars    Neuromuscular Re-education  Standing on airex pad with narrow BOS with horizontal and vertical head turns x 10 each direction  4" step taps standing on airex with no UE support x 10 each LE  Semi tandem walking in // bars x 2.5 laps    Not performed: Airex feet together eyes open/closed x 30s each; Airex feet together horizontal and vertical head turns x 30s each;    PATIENT EDUCATION:  Education details: Pt educated throughout session about proper posture and technique with exercises. Improved exercise technique, movement at target joints, use of target muscles after min to mod verbal, visual, tactile cues.  Person educated: Patient Education method: Explanation Education comprehension: verbalized understanding   HOME EXERCISE PROGRAM:  Access Code: [email or web address] URL: [email or web address] Date: 06/29/2023 Prepared by: Ria Comment  Exercises - Sit to Stand Without Arm Support  - 1 x daily - 7 x weekly - 3 sets - 10 reps - Heel Raises with Counter Support  - 1 x daily - 7 x weekly - 3 sets - 10 reps - 3s hold - Standing Romberg to 1/2 Tandem Stance  - 1 x daily - 7 x weekly - 4 reps - 60s hold   ASSESSMENT:  CLINICAL IMPRESSION:   Session focused on BLE strengthening and balance on uneven surfaces. Fatigues quickly but tolerated session well with challenge. Pt encouraged to follow-up as scheduled.  She will benefit from PT services to address deficits in weakness, balance, and mobility in order to return to full function  at home as well as decrease risk for falls.   OBJECTIVE IMPAIRMENTS: decreased activity tolerance, decreased balance, decreased coordination, and postural dysfunction.   ACTIVITY LIMITATIONS: carrying, sitting, standing, squatting, stairs, and transfers  PARTICIPATION LIMITATIONS: community activity and occupation  PERSONAL FACTORS: Age, Fitness, Past/current experiences, Time since onset of injury/illness/exacerbation, and 3+ comorbidities: lumbar and cervical radiculopathy, polyneuropathy, DDD, osteoporosis, OA, spondylosis, hx of lumbar fusion, chronic pain syndrome  are also affecting patient's functional outcome.   REHAB POTENTIAL: Fair    CLINICAL DECISION MAKING: Unstable/unpredictable  EVALUATION COMPLEXITY: High   GOALS: Goals reviewed with patient? No  SHORT TERM GOALS: Target date: 07/08/23  Pt will be independent with HEP in order to improve strength and balance in order to decrease fall risk and improve function at home. Baseline:  Goal status: INITIAL   LONG TERM GOALS: Target date: 09/16/23  1.  Pt will improve BERG by at least 3 points in order to demonstrate clinically significant improvement in balance.   Baseline: 06/29/23: Goal status: INITIAL  2.  Pt will improve ABC by at least 13% in order to demonstrate clinically significant improvement in balance confidence.      Baseline: 06/29/23: 56.9% Goal status: INITIAL  3. Pt will decrease 5TSTS by at least 3 seconds in order to demonstrate clinically significant improvement in LE strength      Baseline: 19.87s Goal status: INITIAL  4. Pt will increase self-selected by at least 0.13 m/s in order to demonstrate clinically significant improvement in community ambulation.      Baseline: Self-selected: 14.47s = 0.69 m/s Goal status: INITIAL   PLAN: PT FREQUENCY: 2x/week  PT DURATION:  8 weeks  PLANNED INTERVENTIONS: Therapeutic exercises, Therapeutic activity, Neuromuscular re-education, Balance training, Gait training, Patient/Family education, Self Care, Joint mobilization, Joint manipulation, Vestibular training, Canalith repositioning, Orthotic/Fit training, DME instructions, Dry Needling, Electrical stimulation, Spinal manipulation, Spinal mobilization, Cryotherapy, Moist heat, Taping, Traction, Ultrasound, Ionotophoresis 4mg /ml Dexamethasone, Manual therapy, and Re-evaluation.  PLAN FOR NEXT SESSION: progress strength and balance exercises, reviewe/modify HEP as necessary.    Maylon Peppers, PT, DPT Physical Therapist - [facility]  07/15/2023, 1:12 PM

## 2023-07-15 ENCOUNTER — Ambulatory Visit: Payer: Medicare Other

## 2023-07-15 DIAGNOSIS — R2689 Other abnormalities of gait and mobility: Secondary | ICD-10-CM

## 2023-07-15 DIAGNOSIS — M6281 Muscle weakness (generalized): Secondary | ICD-10-CM

## 2023-07-15 DIAGNOSIS — R2681 Unsteadiness on feet: Secondary | ICD-10-CM

## 2023-07-15 DIAGNOSIS — R262 Difficulty in walking, not elsewhere classified: Secondary | ICD-10-CM

## 2023-07-20 ENCOUNTER — Ambulatory Visit: Payer: Medicare Other

## 2023-07-20 DIAGNOSIS — M6281 Muscle weakness (generalized): Secondary | ICD-10-CM

## 2023-07-20 DIAGNOSIS — R2681 Unsteadiness on feet: Secondary | ICD-10-CM | POA: Diagnosis not present

## 2023-07-20 DIAGNOSIS — R262 Difficulty in walking, not elsewhere classified: Secondary | ICD-10-CM

## 2023-07-20 NOTE — Therapy (Signed)
 OUTPATIENT PHYSICAL THERAPY BALANCE TREATMENT  Patient Name: Connie [name] MRN: [id number] DOB:07/08/1955, 68 y.o., female Today's Date: 07/20/2023  END OF SESSION:  PT End of Session - 07/20/23 1321     Visit Number 6    Number of Visits 25    Date for PT Re-Evaluation 09/16/23    Authorization Type eval: 06/24/23    PT Start Time 1322    PT Stop Time 1400    PT Time Calculation (min) 38 min    Equipment Utilized During Treatment Gait belt    Activity Tolerance Patient tolerated treatment well    Behavior During Therapy WFL for tasks assessed/performed            Past Medical History:  Diagnosis Date   Anemia    in the past   Anxiety    Arthritis    all over   Cervical spondylosis with myelopathy 2018   Chronic pain 2019   can't stand straight, uses walker for stability   DDD (degenerative disc disease), lumbar    Depression    Displacement of cervical intervertebral disc 2018   Dyspnea    HANDICAPPED   Fibromyalgia    Polymyalgia Rheumatica   GERD (gastroesophageal reflux disease)    Heart murmur    not treated or being followed   Hypertension    CONTROLLED   Hypothyroidism    Lumbar post-laminectomy syndrome 2018   Lumbosacral radiculitis 2018   Neuropathy due to medical condition (HCC)    feet, toes, fingers   Osteoarthritis    Osteoporosis    Other long term (current) drug therapy 2019   from pain management   Pain in the coccyx 2018   disorder of sacrum   Sleep apnea    Spondylolisthesis 2018   Spondylosis of lumbosacral region without myelopathy or radiculopathy 2018   Spondylosis with myelopathy, lumbar region 2018   Vitamin D deficiency    Past Surgical History:  Procedure Laterality Date   ABDOMINAL HYSTERECTOMY  2000   APPENDECTOMY     arm surgery  2002   BREAST BIOPSY Left 2005   benign   CATARACT EXTRACTION W/PHACO Right 10/21/2017   Procedure: CATARACT EXTRACTION PHACO AND INTRAOCULAR LENS PLACEMENT (IOC) RIGHT;  Surgeon:  Lockie Mola, MD;  Location: [facility];  Service: Ophthalmology;  Laterality: Right;  neck issues   CESAREAN SECTION     x 2   CHOLECYSTECTOMY     COLONOSCOPY WITH PROPOFOL N/A 08/25/2017   Procedure: COLONOSCOPY WITH PROPOFOL;  Surgeon: Toledo, Boykin Nearing, MD;  Location: [facility];  Service: Gastroenterology;  Laterality: N/A;   COLONOSCOPY WITH PROPOFOL N/A 02/25/2022   Procedure: COLONOSCOPY WITH PROPOFOL;  Surgeon: Regis Bill, MD;  Location: [facility];  Service: Endoscopy;  Laterality: N/A;   epidural injection  2018   steroids   GASTRIC BYPASS  2004   JOINT REPLACEMENT Bilateral 2005   knees   LUMBAR FUSION  2016   x 2/ L3-5   SHOULDER ARTHROSCOPY WITH ROTATOR CUFF REPAIR Right 2008   x 3   SPINAL CORD STIMULATOR IMPLANT  2018   [employer]   TEMPORAL ARTERY BIOPSY / LIGATION     TONSILLECTOMY     Patient Active Problem List   Diagnosis Date Noted   Insomnia due to medical condition 09/16/2022   Bilateral carpal tunnel syndrome 02/12/2022   At risk for prolonged QT interval syndrome 01/16/2022   Psychosis (HCC) 01/16/2022   Flatus 08/27/2021   Diarrhea due  to malabsorption 08/27/2021   Myoclonic jerking 08/06/2021   Polyneuropathy, unspecified 04/28/2021   Personal history of nicotine dependence 04/28/2021   Obstructive sleep apnea of adult 04/28/2021   Opioid type dependence, continuous (HCC) 03/13/2020   Spinal stenosis of lumbosacral region 03/13/2020   MDD (major depressive disorder), recurrent, in full remission (HCC) 12/29/2019   Hypersomnia 12/29/2019   MDD (major depressive disorder), recurrent, in partial remission (HCC) 10/27/2019   GAD (generalized anxiety disorder) 01/11/2019   Bereavement 01/11/2019   Opioid use with withdrawal (HCC) 01/11/2019   Pain syndrome, chronic 09/21/2018   Low serum vitamin B12 09/15/2018   Moderate episode of recurrent major depressive disorder (HCC) 09/15/2018   Memory loss 05/12/2018    Chronic pain of both shoulders 05/12/2018   Osteoporosis, post-menopausal 12/02/2017   Vitamin D deficiency, unspecified 07/22/2017   Candidiasis of skin and nails 07/13/2017   Menopausal symptom 07/13/2017   Bilateral hand pain 06/26/2017   CRP elevated 06/26/2017   Jaw pain, non-TMJ 06/26/2017   Temporal headache 06/26/2017   Stiffness of shoulder joint 06/26/2017   Screening for osteoporosis 06/26/2017   SOBOE (shortness of breath on exertion) 06/09/2017   Joint pain 05/20/2017   Arthritis 05/20/2017   History of lumbar fusion 04/23/2017   Lumbar degenerative disc disease 04/23/2017   Fibromyalgia 04/23/2017   Cervicalgia 04/23/2017   DDD (degenerative disc disease), cervical 04/23/2017   Chronic pain syndrome 04/23/2017   Spinal cord stimulator status 04/23/2017   Complete tear of right rotator cuff 04/17/2017   Rotator cuff tendinitis, right 04/17/2017   Rotator cuff arthropathy of both shoulders 04/12/2017   Degenerative joint disease of cervical and lumbar spine 03/17/2017   Generalized osteoarthritis of multiple sites 03/17/2017   Seasonal allergies 03/17/2017   GERD without esophagitis 01/18/2017   Hypothyroidism 01/18/2017   Fatty liver 12/08/2016   Osteoporosis 09/30/2016   Pain in the coccyx 08/19/2016   Delusional disorder (HCC) 05/27/2016   Essential hypertension 04/09/2016   Chiari malformation type I (HCC) 03/24/2016   Severe obesity (BMI >= 40) (HCC) 03/24/2016   Lumbosacral spondylosis without myelopathy 09/17/2015   Chronic midline low back pain 03/28/2015   Lumbosacral radiculitis 03/05/2015   Morgellons disease 12/01/2014   Medication-induced delirium, acute, hyperactive 11/17/2014   Fatigue 05/08/2014   Lumbar post-laminectomy syndrome 05/08/2014   Lumbar radiculopathy 05/08/2014   Cervical radiculopathy 05/08/2014   H/O gastric bypass 03/03/2014   Dysphagia, oropharyngeal phase 05/18/2013   Acquired spondylolisthesis 08/23/2012   Nonunion of  fracture 08/23/2012   Spinal stenosis of lumbar region without neurogenic claudication 08/23/2012   Dry eyes, bilateral 07/26/2012   Leg cramps 07/26/2012   Diastolic dysfunction 06/14/2012   Lumbar spondylosis 04/06/2012   Lumbar pseudoarthrosis 04/06/2012   Iron deficiency 01/14/2012   Chronic pain 12/16/2011   Edema 12/16/2011   Internal hemorrhoid 12/16/2011   OA (osteoarthritis) 12/16/2011    PCP: Wilford Corner, PA-C  REFERRING PROVIDER: Janice Coffin, PA-C   REFERRING DIAG: Imbalance  RATIONALE FOR EVALUATION AND TREATMENT: Rehabilitation  THERAPY DIAG: Difficulty in walking, not elsewhere classified  Muscle weakness (generalized)  ONSET DATE: Chronic; "a couple years"  FOLLOW-UP APPT SCHEDULED WITH REFERRING PROVIDER: No    SUBJECTIVE:  SUBJECTIVE STATEMENT:  Imbalance and unsteadiness  PERTINENT HISTORY: Pt reports to PT today c/c imbalance beginning "a couple years ago." Pt began noticing imbalance prior to any falls, but has experienced a few until beginning use of rollator. She states "holding onto something helps to keep my balance." Her husband has Parkinson's and struggles to help her around the house. She has a son and 2 grandsons that live a few blocks away, but she does not see them often. Pt notes they will ocme over to help if she is specific about the task and time. Pt notes sciatica pain in LLE that doesn't go away, about 6 mos ongoing. Pt reports hx of BLE knee replacement surgery and further notes shoulder pain. Pt has had 3 shoulder surgeries to repair a torn rotator cuff in the RUE. Pt reports tearing again, MD advised BUE reverse shoulder arthroplasty, but pt has decided against due to demands of rehabilitation and necessity for independence at home. She further  notes back pain beginning a few years ago. Pt notes she underwent an L4-5 fusion, but had to undergo a second surgery after the first one failed. Pt reports second surgery included L3-L5, but also failed to fuse. Pt notes still experiencing back pain, and later underwent surgery for a spinal cord stimulator. She notes still having it, however it will not charge so it is unusable as fixing it would require another surgery. Pt notes that MD had previously advised against the surgery due to pt's weight at the time. She has now lost enough weight, but has decided against the surgery because she "feels scared, too old, and wants to see if PT can help first."  Pain: Yes Numbness/Tingling: Yes; down LLE, N/T into toes RLE Focal Weakness: Yes Recent changes in overall health/medication: No; see hx, taking several medications Prior history of physical therapy for balance: No; prior PT for back pain Dominant hand: right Imaging: Yes  Red flags: Negative for bowel/bladder changes, saddle paresthesia, personal history of cancer, h/o spinal tumors, h/o compression fx, h/o abdominal aneurysm, abdominal pain, chills/fever, night sweats, nausea, vomiting  PRECAUTIONS: None  WEIGHT BEARING RESTRICTIONS: No  FALLS: Has patient fallen in last 6 months? 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  Recent memory is intact.  Remote memory is intact.  Attention span and concentration are intact.  Expressive speech is intact.  Patient's fund of knowledge is within normal limits for educational level.    Gross Musculoskeletal Assessment Tremor: None Bulk: Normal Tone: Normal  Posture: Formal assessment deferred  AROM Formal assessment deferred; grossly WNL for LE  LE MMT: MMT (out of 5) Right  Left   Hip flexion  4+ 4+  Hip extension    Hip abduction    Hip adduction    Hip internal rotation    Hip external rotation    Knee flexion 5 (seated position) 5 (seated position)  Knee extension 5 4+*  Ankle dorsiflexion 4* 5  Ankle plantarflexion WNL WNL  Ankle inversion    Ankle eversion    (* = pain; Blank rows = not tested)  Sensation Diminished to light touch on LLE L1 and S2. Proprioception, stereognosis, and hot/cold testing deferred on this date.  Reflexes Deferred  Cranial Nerves Deferred  Coordination/Cerebellar Deferred  Bed mobility: Deferred  Transfers: Assistive device utilized:  Rollator   Sit to stand: Modified independence Stand to sit: Modified independence Chair to chair: Modified independence Floor:  Deferred  Curb:  Deferred  Stairs: Deferred  Gait: Formal gait assessment deferred.   Functional Outcome Measures  Results Comments  BERG    DGI    FGA    TUG 10.56 seconds Using rollator  5TSTS 19.87 seconds Increased fall risk  6 Minute Walk Test    10 Meter Gait Speed Self-selected: 14.47s = 0.69 m/s Using rollator  (Blank rows = not tested)  06/29/23 ABC: 56.9% BERG: 46/56; mCTSIB: 30s in conditions 1-4 with 3+ sway in condition 4;   TODAY'S TREATMENT 07/20/23     SUBJECTIVE: Patient reports that she is doing alright today. She just left a doctor's appointment with her husband and he was diagnosed with ALS. No specific questions upon arrival.    PAIN: Ongoing chronic pain;   Therapeutic Activity NuStep L1-3 x 8 minutes for BLE strengthening and warm-up during interval history; Alternating 6" step-ups with BUE support x 10 BLE; Sit to stand 2 x 10 with no UE support  Side stepping with BTB around knees x multiple laps;    Neuromuscular Re-education  All exercises performed without UE support unless otherwise noted: Alternating 6" step taps x 10 BLE; Tandem gait in // bars x multiple lengths; Tandem balance alternating forward LE x 30s each; Feet together (FT) eyes open/closed x 30s each; FT eyes closed horizontal and vertical head turns x 30s each;   PATIENT EDUCATION:  Education details: Pt educated throughout session about proper posture and technique with exercises. Improved exercise technique, movement at target joints, use of target muscles after min to mod verbal, visual, tactile cues.  Person educated: Patient Education method: Explanation Education comprehension: verbalized understanding   HOME EXERCISE PROGRAM:  Access Code: [email or web address] URL: [email or web address] Date: 06/29/2023 Prepared by: Ria Comment  Exercises - Sit to Stand Without Arm Support  - 1 x daily - 7 x weekly - 3 sets - 10 reps - Heel Raises with Counter Support  - 1 x daily - 7 x weekly - 3 sets - 10 reps - 3s hold - Standing Romberg to 1/2 Tandem Stance  - 1 x daily - 7 x weekly - 4 reps - 60s hold   ASSESSMENT:  CLINICAL IMPRESSION:   Session focused on BLE strengthening and balance exercises. No excessive fatigue during session today. Pt encouraged to follow-up as scheduled. She will benefit from PT services to address deficits in weakness, balance, and mobility in order to return to full function at home as well as decrease risk for falls.    OBJECTIVE IMPAIRMENTS: decreased activity tolerance, decreased balance, decreased coordination, and postural dysfunction.   ACTIVITY LIMITATIONS: carrying, sitting, standing, squatting, stairs, and transfers  PARTICIPATION LIMITATIONS: community activity and occupation  PERSONAL FACTORS: Age, Fitness,  Past/current experiences, Time since onset of injury/illness/exacerbation, and 3+ comorbidities: lumbar and cervical radiculopathy, polyneuropathy, DDD, osteoporosis, OA, spondylosis, hx of lumbar fusion, chronic pain syndrome  are also affecting patient's functional outcome.   REHAB POTENTIAL: Fair    CLINICAL DECISION MAKING: Unstable/unpredictable  EVALUATION COMPLEXITY: High   GOALS: Goals reviewed with patient? No  SHORT TERM GOALS: Target date: 07/08/23  Pt will be independent with HEP in order to improve strength and balance in order to decrease fall risk and improve function at home. Baseline:  Goal status: INITIAL   LONG TERM GOALS: Target date: 09/16/23  1.  Pt will improve BERG by at least 3 points in order to demonstrate clinically significant improvement in balance.   Baseline: 06/29/23: Goal status: INITIAL  2.  Pt will improve ABC by at least 13% in order to demonstrate clinically significant improvement in balance confidence.      Baseline: 06/29/23: 56.9% Goal status: INITIAL  3. Pt will decrease 5TSTS by at least 3 seconds in order to demonstrate clinically significant improvement in LE strength      Baseline: 19.87s Goal status: INITIAL  4. Pt will increase self-selected by at least 0.13 m/s in order to demonstrate clinically significant improvement in community ambulation.      Baseline: Self-selected: 14.47s = 0.69 m/s Goal status: INITIAL   PLAN: PT FREQUENCY: 2x/week  PT DURATION: 8 weeks  PLANNED INTERVENTIONS: Therapeutic exercises, Therapeutic activity, Neuromuscular re-education, Balance training, Gait training, Patient/Family education,  Self Care, Joint mobilization, Joint manipulation, Vestibular training, Canalith repositioning, Orthotic/Fit training, DME instructions, Dry Needling, Electrical stimulation, Spinal manipulation, Spinal mobilization, Cryotherapy, Moist heat, Taping, Traction, Ultrasound, Ionotophoresis 4mg /ml Dexamethasone, Manual therapy, and Re-evaluation.  PLAN FOR NEXT SESSION: progress strength and balance exercises, reviewe/modify HEP as necessary.   Lynnea Maizes PT, DPT, GCS  Physical Therapist - [facility] 07/20/2023, 4:23 PM

## 2023-07-22 ENCOUNTER — Ambulatory Visit: Payer: Medicare Other

## 2023-07-25 NOTE — Therapy (Incomplete)
 OUTPATIENT PHYSICAL THERAPY BALANCE TREATMENT  Patient Name: Connie [name] MRN: [id number] DOB:1955-05-28, 68 y.o., female Today's Date: 07/25/2023  END OF SESSION:   Past Medical History:  Diagnosis Date   Anemia    in the past   Anxiety    Arthritis    all over   Cervical spondylosis with myelopathy 2018   Chronic pain 2019   can't stand straight, uses walker for stability   DDD (degenerative disc disease), lumbar    Depression    Displacement of cervical intervertebral disc 2018   Dyspnea    HANDICAPPED   Fibromyalgia    Polymyalgia Rheumatica   GERD (gastroesophageal reflux disease)    Heart murmur    not treated or being followed   Hypertension    CONTROLLED   Hypothyroidism    Lumbar post-laminectomy syndrome 2018   Lumbosacral radiculitis 2018   Neuropathy due to medical condition (HCC)    feet, toes, fingers   Osteoarthritis    Osteoporosis    Other long term (current) drug therapy 2019   from pain management   Pain in the coccyx 2018   disorder of sacrum   Sleep apnea    Spondylolisthesis 2018   Spondylosis of lumbosacral region without myelopathy or radiculopathy 2018   Spondylosis with myelopathy, lumbar region 2018   Vitamin D deficiency    Past Surgical History:  Procedure Laterality Date   ABDOMINAL HYSTERECTOMY  2000   APPENDECTOMY     arm surgery  2002   BREAST BIOPSY Left 2005   benign   CATARACT EXTRACTION W/PHACO Right 10/21/2017   Procedure: CATARACT EXTRACTION PHACO AND INTRAOCULAR LENS PLACEMENT (IOC) RIGHT;  Surgeon: Lockie Mola, MD;  Location: [facility];  Service: Ophthalmology;  Laterality: Right;  neck issues   CESAREAN SECTION     x 2   CHOLECYSTECTOMY     COLONOSCOPY WITH PROPOFOL N/A 08/25/2017   Procedure: COLONOSCOPY WITH PROPOFOL;  Surgeon: Toledo, Boykin Nearing, MD;  Location: [facility];  Service: Gastroenterology;  Laterality: N/A;   COLONOSCOPY WITH PROPOFOL N/A 02/25/2022   Procedure: COLONOSCOPY  WITH PROPOFOL;  Surgeon: Regis Bill, MD;  Location: [facility];  Service: Endoscopy;  Laterality: N/A;   epidural injection  2018   steroids   GASTRIC BYPASS  2004   JOINT REPLACEMENT Bilateral 2005   knees   LUMBAR FUSION  2016   x 2/ L3-5   SHOULDER ARTHROSCOPY WITH ROTATOR CUFF REPAIR Right 2008   x 3   SPINAL CORD STIMULATOR IMPLANT  2018   [employer]   TEMPORAL ARTERY BIOPSY / LIGATION     TONSILLECTOMY     Patient Active Problem List   Diagnosis Date Noted   Insomnia due to medical condition 09/16/2022   Bilateral carpal tunnel syndrome 02/12/2022   At risk for prolonged QT interval syndrome 01/16/2022   Psychosis (HCC) 01/16/2022   Flatus 08/27/2021   Diarrhea due to malabsorption 08/27/2021   Myoclonic jerking 08/06/2021   Polyneuropathy, unspecified 04/28/2021   Personal history of nicotine dependence 04/28/2021   Obstructive sleep apnea of adult 04/28/2021   Opioid type dependence, continuous (HCC) 03/13/2020   Spinal stenosis of lumbosacral region 03/13/2020   MDD (major depressive disorder), recurrent, in full remission (HCC) 12/29/2019   Hypersomnia 12/29/2019   MDD (major depressive disorder), recurrent, in partial remission (HCC) 10/27/2019   GAD (generalized anxiety disorder) 01/11/2019   Bereavement 01/11/2019   Opioid use with withdrawal (HCC) 01/11/2019   Pain syndrome, chronic  09/21/2018   Low serum vitamin B12 09/15/2018   Moderate episode of recurrent major depressive disorder (HCC) 09/15/2018   Memory loss 05/12/2018   Chronic pain of both shoulders 05/12/2018   Osteoporosis, post-menopausal 12/02/2017   Vitamin D deficiency, unspecified 07/22/2017   Candidiasis of skin and nails 07/13/2017   Menopausal symptom 07/13/2017   Bilateral hand pain 06/26/2017   CRP elevated 06/26/2017   Jaw pain, non-TMJ 06/26/2017   Temporal headache 06/26/2017   Stiffness of shoulder joint 06/26/2017   Screening for osteoporosis 06/26/2017    SOBOE (shortness of breath on exertion) 06/09/2017   Joint pain 05/20/2017   Arthritis 05/20/2017   History of lumbar fusion 04/23/2017   Lumbar degenerative disc disease 04/23/2017   Fibromyalgia 04/23/2017   Cervicalgia 04/23/2017   DDD (degenerative disc disease), cervical 04/23/2017   Chronic pain syndrome 04/23/2017   Spinal cord stimulator status 04/23/2017   Complete tear of right rotator cuff 04/17/2017   Rotator cuff tendinitis, right 04/17/2017   Rotator cuff arthropathy of both shoulders 04/12/2017   Degenerative joint disease of cervical and lumbar spine 03/17/2017   Generalized osteoarthritis of multiple sites 03/17/2017   Seasonal allergies 03/17/2017   GERD without esophagitis 01/18/2017   Hypothyroidism 01/18/2017   Fatty liver 12/08/2016   Osteoporosis 09/30/2016   Pain in the coccyx 08/19/2016   Delusional disorder (HCC) 05/27/2016   Essential hypertension 04/09/2016   Chiari malformation type I (HCC) 03/24/2016   Severe obesity (BMI >= 40) (HCC) 03/24/2016   Lumbosacral spondylosis without myelopathy 09/17/2015   Chronic midline low back pain 03/28/2015   Lumbosacral radiculitis 03/05/2015   Morgellons disease 12/01/2014   Medication-induced delirium, acute, hyperactive 11/17/2014   Fatigue 05/08/2014   Lumbar post-laminectomy syndrome 05/08/2014   Lumbar radiculopathy 05/08/2014   Cervical radiculopathy 05/08/2014   H/O gastric bypass 03/03/2014   Dysphagia, oropharyngeal phase 05/18/2013   Acquired spondylolisthesis 08/23/2012   Nonunion of fracture 08/23/2012   Spinal stenosis of lumbar region without neurogenic claudication 08/23/2012   Dry eyes, bilateral 07/26/2012   Leg cramps 07/26/2012   Diastolic dysfunction 06/14/2012   Lumbar spondylosis 04/06/2012   Lumbar pseudoarthrosis 04/06/2012   Iron deficiency 01/14/2012   Chronic pain 12/16/2011   Edema 12/16/2011   Internal hemorrhoid 12/16/2011   OA (osteoarthritis) 12/16/2011    PCP:  Wilford Corner, PA-C  REFERRING PROVIDER: Janice Coffin, PA-C   REFERRING DIAG: Imbalance  RATIONALE FOR EVALUATION AND TREATMENT: Rehabilitation  THERAPY DIAG: Difficulty in walking, not elsewhere classified  Muscle weakness (generalized)  ONSET DATE: Chronic; "a couple years"  FOLLOW-UP APPT SCHEDULED WITH REFERRING PROVIDER: No    SUBJECTIVE:  SUBJECTIVE STATEMENT:  Imbalance and unsteadiness  PERTINENT HISTORY: Pt reports to PT today c/c imbalance beginning "a couple years ago." Pt began noticing imbalance prior to any falls, but has experienced a few until beginning use of rollator. She states "holding onto something helps to keep my balance." Her husband has Parkinson's and struggles to help her around the house. She has a son and 2 grandsons that live a few blocks away, but she does not see them often. Pt notes they will ocme over to help if she is specific about the task and time. Pt notes sciatica pain in LLE that doesn't go away, about 6 mos ongoing. Pt reports hx of BLE knee replacement surgery and further notes shoulder pain. Pt has had 3 shoulder surgeries to repair a torn rotator cuff in the RUE. Pt reports tearing again, MD advised BUE reverse shoulder arthroplasty, but pt has decided against due to demands of rehabilitation and necessity for independence at home. She further notes back pain beginning a few years ago. Pt notes she underwent an L4-5 fusion, but had to undergo a second surgery after the first one failed. Pt reports second surgery included L3-L5, but also failed to fuse. Pt notes still experiencing back pain, and later underwent surgery for a spinal cord stimulator. She notes still having it, however it will not charge so it is unusable as fixing it would require another  surgery. Pt notes that MD had previously advised against the surgery due to pt's weight at the time. She has now lost enough weight, but has decided against the surgery because she "feels scared, too old, and wants to see if PT can help first."  Pain: Yes Numbness/Tingling: Yes; down LLE, N/T into toes RLE Focal Weakness: Yes Recent changes in overall health/medication: No; see hx, taking several medications Prior history of physical therapy for balance: No; prior PT for back pain Dominant hand: right Imaging: Yes  Red flags: Negative for bowel/bladder changes, saddle paresthesia, personal history of cancer, h/o spinal tumors, h/o compression fx, h/o abdominal aneurysm, abdominal pain, chills/fever, night sweats, nausea, vomiting  PRECAUTIONS: None  WEIGHT BEARING RESTRICTIONS: No  FALLS: Has patient fallen in last 6 months? 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  Recent memory is intact.  Remote memory is intact.  Attention span and concentration are intact.  Expressive speech is intact.  Patient's fund of  knowledge is within normal limits for educational level.    Gross Musculoskeletal Assessment Tremor: None Bulk: Normal Tone: Normal  Posture: Formal assessment deferred  AROM Formal assessment deferred; grossly WNL for LE  LE MMT: MMT (out of 5) Right  Left   Hip flexion  4+ 4+  Hip extension    Hip abduction    Hip adduction    Hip internal rotation    Hip external rotation    Knee flexion 5 (seated position) 5 (seated position)  Knee extension 5 4+*  Ankle dorsiflexion 4* 5  Ankle plantarflexion WNL WNL  Ankle inversion    Ankle eversion    (* = pain; Blank rows = not tested)  Sensation Diminished to light touch on LLE L1 and S2. Proprioception, stereognosis, and hot/cold testing deferred on this date.  Reflexes Deferred  Cranial Nerves Deferred  Coordination/Cerebellar Deferred  Bed mobility: Deferred  Transfers: Assistive device utilized:  Rollator   Sit to stand: Modified independence Stand to sit: Modified independence Chair to chair: Modified independence Floor:  Deferred  Curb:  Deferred  Stairs: Deferred  Gait: Formal gait assessment deferred.   Functional Outcome Measures  Results Comments  BERG    DGI    FGA    TUG 10.56 seconds Using rollator  5TSTS 19.87 seconds Increased fall risk  6 Minute Walk Test    10 Meter Gait Speed Self-selected: 14.47s = 0.69 m/s Using rollator  (Blank rows = not tested)  06/29/23 ABC: 56.9% BERG: 46/56; mCTSIB: 30s in conditions 1-4 with 3+ sway in condition 4;   TODAY'S TREATMENT 07/25/23    SUBJECTIVE: Patient reports that she is doing alright today. She just left a doctor's appointment with her husband and he was diagnosed with ALS. No specific questions upon arrival.    PAIN: Ongoing chronic pain;   Therapeutic Activity NuStep L1-3 x 8 minutes for BLE strengthening and warm-up during interval history; Alternating 6" step-ups with BUE support x 10 BLE; Sit to stand 2 x 10 with no UE  support  Side stepping with BTB around knees x multiple laps;    Neuromuscular Re-education  All exercises performed without UE support unless otherwise noted: Alternating 6" step taps x 10 BLE; Tandem gait in // bars x multiple lengths; Tandem balance alternating forward LE x 30s each; Feet together (FT) eyes open/closed x 30s each; FT eyes closed horizontal and vertical head turns x 30s each;   PATIENT EDUCATION:  Education details: Pt educated throughout session about proper posture and technique with exercises. Improved exercise technique, movement at target joints, use of target muscles after min to mod verbal, visual, tactile cues.  Person educated: Patient Education method: Explanation Education comprehension: verbalized understanding   HOME EXERCISE PROGRAM:  Access Code: [email or web address] URL: [email or web address] Date: 06/29/2023 Prepared by: Ria Comment  Exercises - Sit to Stand Without Arm Support  - 1 x daily - 7 x weekly - 3 sets - 10 reps - Heel Raises with Counter Support  - 1 x daily - 7 x weekly - 3 sets - 10 reps - 3s hold - Standing Romberg to 1/2 Tandem Stance  - 1 x daily - 7 x weekly - 4 reps - 60s hold   ASSESSMENT:  CLINICAL IMPRESSION:   Session focused on BLE strengthening and balance exercises. No excessive fatigue during session today. Pt encouraged to follow-up as scheduled. She will benefit from PT services to address deficits in weakness, balance, and mobility in order to return to full function at home as well as decrease risk for falls.   OBJECTIVE IMPAIRMENTS: decreased activity tolerance, decreased balance, decreased coordination, and postural dysfunction.   ACTIVITY LIMITATIONS: carrying, sitting, standing, squatting, stairs, and transfers  PARTICIPATION LIMITATIONS: community activity and occupation  PERSONAL FACTORS: Age, Fitness, Past/current  experiences, Time since onset of injury/illness/exacerbation, and 3+ comorbidities:  lumbar and cervical radiculopathy, polyneuropathy, DDD, osteoporosis, OA, spondylosis, hx of lumbar fusion, chronic pain syndrome  are also affecting patient's functional outcome.   REHAB POTENTIAL: Fair    CLINICAL DECISION MAKING: Unstable/unpredictable  EVALUATION COMPLEXITY: High   GOALS: Goals reviewed with patient? No  SHORT TERM GOALS: Target date: 07/08/23  Pt will be independent with HEP in order to improve strength and balance in order to decrease fall risk and improve function at home. Baseline:  Goal status: INITIAL   LONG TERM GOALS: Target date: 09/16/23  1.  Pt will improve BERG by at least 3 points in order to demonstrate clinically significant improvement in balance.   Baseline: 06/29/23: Goal status: INITIAL  2.  Pt will improve ABC by at least 13% in order to demonstrate clinically significant improvement in balance confidence.      Baseline: 06/29/23: 56.9% Goal status: INITIAL  3. Pt will decrease 5TSTS by at least 3 seconds in order to demonstrate clinically significant improvement in LE strength      Baseline: 19.87s Goal status: INITIAL  4. Pt will increase self-selected by at least 0.13 m/s in order to demonstrate clinically significant improvement in community ambulation.      Baseline: Self-selected: 14.47s = 0.69 m/s Goal status: INITIAL   PLAN: PT FREQUENCY: 2x/week  PT DURATION: 8 weeks  PLANNED INTERVENTIONS: Therapeutic exercises, Therapeutic activity, Neuromuscular re-education, Balance training, Gait training, Patient/Family education, Self Care, Joint mobilization, Joint manipulation, Vestibular training, Canalith repositioning, Orthotic/Fit training, DME instructions, Dry Needling, Electrical stimulation, Spinal manipulation, Spinal mobilization, Cryotherapy, Moist heat, Taping, Traction, Ultrasound, Ionotophoresis 4mg /ml Dexamethasone, Manual therapy, and Re-evaluation.  PLAN FOR NEXT SESSION: progress strength and balance exercises,  reviewe/modify HEP as necessary.   Lynnea Maizes PT, DPT, GCS  Physical Therapist - [facility] 07/25/2023, 8:18 PM

## 2023-07-27 ENCOUNTER — Ambulatory Visit: Payer: Medicare Other

## 2023-07-27 DIAGNOSIS — R262 Difficulty in walking, not elsewhere classified: Secondary | ICD-10-CM

## 2023-07-27 DIAGNOSIS — R2689 Other abnormalities of gait and mobility: Secondary | ICD-10-CM

## 2023-07-27 DIAGNOSIS — R2681 Unsteadiness on feet: Secondary | ICD-10-CM

## 2023-07-27 DIAGNOSIS — M6281 Muscle weakness (generalized): Secondary | ICD-10-CM

## 2023-07-27 NOTE — Therapy (Signed)
 OUTPATIENT PHYSICAL THERAPY BALANCE TREATMENT  Patient Name: Connie [name] MRN: [id number] DOB:July 13, 1955, 68 y.o., female Today's Date: 07/27/2023  END OF SESSION:  PT End of Session - 07/27/23 1450     Visit Number 7    Number of Visits 25    Date for PT Re-Evaluation 09/16/23    Authorization Type eval: 06/24/23    PT Start Time 1321    PT Stop Time 1400    PT Time Calculation (min) 39 min    Activity Tolerance Patient tolerated treatment well    Behavior During Therapy [facility] for tasks assessed/performed             Past Medical History:  Diagnosis Date   Anemia    in the past   Anxiety    Arthritis    all over   Cervical spondylosis with myelopathy 2018   Chronic pain 2019   can't stand straight, uses walker for stability   DDD (degenerative disc disease), lumbar    Depression    Displacement of cervical intervertebral disc 2018   Dyspnea    HANDICAPPED   Fibromyalgia    Polymyalgia Rheumatica   GERD (gastroesophageal reflux disease)    Heart murmur    not treated or being followed   Hypertension    CONTROLLED   Hypothyroidism    Lumbar post-laminectomy syndrome 2018   Lumbosacral radiculitis 2018   Neuropathy due to medical condition (HCC)    feet, toes, fingers   Osteoarthritis    Osteoporosis    Other long term (current) drug therapy 2019   from pain management   Pain in the coccyx 2018   disorder of sacrum   Sleep apnea    Spondylolisthesis 2018   Spondylosis of lumbosacral region without myelopathy or radiculopathy 2018   Spondylosis with myelopathy, lumbar region 2018   Vitamin D deficiency    Past Surgical History:  Procedure Laterality Date   ABDOMINAL HYSTERECTOMY  2000   APPENDECTOMY     arm surgery  2002   BREAST BIOPSY Left 2005   benign   CATARACT EXTRACTION W/PHACO Right 10/21/2017   Procedure: CATARACT EXTRACTION PHACO AND INTRAOCULAR LENS PLACEMENT (IOC) RIGHT;  Surgeon: Lockie Mola, MD;  Location: [facility];  Service: Ophthalmology;  Laterality: Right;  neck issues   CESAREAN SECTION     x 2   CHOLECYSTECTOMY     COLONOSCOPY WITH PROPOFOL N/A 08/25/2017   Procedure: COLONOSCOPY WITH PROPOFOL;  Surgeon: Toledo, Boykin Nearing, MD;  Location: [facility];  Service: Gastroenterology;  Laterality: N/A;   COLONOSCOPY WITH PROPOFOL N/A 02/25/2022   Procedure: COLONOSCOPY WITH PROPOFOL;  Surgeon: Regis Bill, MD;  Location: [facility] END[facility]COPY;  Service: Endoscopy;  Laterality: N/A;   epidural injection  2018   steroids   GASTRIC BYPASS  2004   JOINT REPLACEMENT Bilateral 2005   knees   LUMBAR FUSION  2016   x 2/ L3-5   SHOULDER ARTHROSCOPY WITH ROTATOR CUFF REPAIR Right 2008   x 3   SPINAL CORD STIMULATOR IMPLANT  2018   [employer]   TEMPORAL ARTERY BIOPSY / LIGATION     TONSILLECTOMY     Patient Active Problem List   Diagnosis Date Noted   Insomnia due to medical condition 09/16/2022   Bilateral carpal tunnel syndrome 02/12/2022   At risk for prolonged QT interval syndrome 01/16/2022   Psychosis (HCC) 01/16/2022   Flatus 08/27/2021   Diarrhea due to malabsorption 08/27/2021   Myoclonic jerking 08/06/2021  Polyneuropathy, unspecified 04/28/2021   Personal history of nicotine dependence 04/28/2021   Obstructive sleep apnea of adult 04/28/2021   Opioid type dependence, continuous (HCC) 03/13/2020   Spinal stenosis of lumbosacral region 03/13/2020   MDD (major depressive disorder), recurrent, in full remission (HCC) 12/29/2019   Hypersomnia 12/29/2019   MDD (major depressive disorder), recurrent, in partial remission (HCC) 10/27/2019   GAD (generalized anxiety disorder) 01/11/2019   Bereavement 01/11/2019   Opioid use with withdrawal (HCC) 01/11/2019   Pain syndrome, chronic 09/21/2018   Low serum vitamin B12 09/15/2018   Moderate episode of recurrent major depressive disorder (HCC) 09/15/2018   Memory loss 05/12/2018   Chronic pain of both shoulders 05/12/2018    Osteoporosis, post-menopausal 12/02/2017   Vitamin D deficiency, unspecified 07/22/2017   Candidiasis of skin and nails 07/13/2017   Menopausal symptom 07/13/2017   Bilateral hand pain 06/26/2017   CRP elevated 06/26/2017   Jaw pain, non-TMJ 06/26/2017   Temporal headache 06/26/2017   Stiffness of shoulder joint 06/26/2017   Screening for osteoporosis 06/26/2017   SOBOE (shortness of breath on exertion) 06/09/2017   Joint pain 05/20/2017   Arthritis 05/20/2017   History of lumbar fusion 04/23/2017   Lumbar degenerative disc disease 04/23/2017   Fibromyalgia 04/23/2017   Cervicalgia 04/23/2017   DDD (degenerative disc disease), cervical 04/23/2017   Chronic pain syndrome 04/23/2017   Spinal cord stimulator status 04/23/2017   Complete tear of right rotator cuff 04/17/2017   Rotator cuff tendinitis, right 04/17/2017   Rotator cuff arthropathy of both shoulders 04/12/2017   Degenerative joint disease of cervical and lumbar spine 03/17/2017   Generalized osteoarthritis of multiple sites 03/17/2017   Seasonal allergies 03/17/2017   GERD without esophagitis 01/18/2017   Hypothyroidism 01/18/2017   Fatty liver 12/08/2016   Osteoporosis 09/30/2016   Pain in the coccyx 08/19/2016   Delusional disorder (HCC) 05/27/2016   Essential hypertension 04/09/2016   Chiari malformation type I (HCC) 03/24/2016   Severe obesity (BMI >= 40) (HCC) 03/24/2016   Lumbosacral spondylosis without myelopathy 09/17/2015   Chronic midline low back pain 03/28/2015   Lumbosacral radiculitis 03/05/2015   Morgellons disease 12/01/2014   Medication-induced delirium, acute, hyperactive 11/17/2014   Fatigue 05/08/2014   Lumbar post-laminectomy syndrome 05/08/2014   Lumbar radiculopathy 05/08/2014   Cervical radiculopathy 05/08/2014   H/O gastric bypass 03/03/2014   Dysphagia, oropharyngeal phase 05/18/2013   Acquired spondylolisthesis 08/23/2012   Nonunion of fracture 08/23/2012   Spinal stenosis of lumbar  region without neurogenic claudication 08/23/2012   Dry eyes, bilateral 07/26/2012   Leg cramps 07/26/2012   Diastolic dysfunction 06/14/2012   Lumbar spondylosis 04/06/2012   Lumbar pseudoarthrosis 04/06/2012   Iron deficiency 01/14/2012   Chronic pain 12/16/2011   Edema 12/16/2011   Internal hemorrhoid 12/16/2011   OA (osteoarthritis) 12/16/2011    PCP: Wilford Corner, PA-C  REFERRING PROVIDER: Janice Coffin, PA-C   REFERRING DIAG: Imbalance  RATIONALE FOR EVALUATION AND TREATMENT: Rehabilitation  THERAPY DIAG: Difficulty in walking, not elsewhere classified  Muscle weakness (generalized)  Unsteadiness on feet  Imbalance  ONSET DATE: Chronic; "a couple years"  FOLLOW-UP APPT SCHEDULED WITH REFERRING PROVIDER: No    SUBJECTIVE:  SUBJECTIVE STATEMENT:  Imbalance and unsteadiness  PERTINENT HISTORY: Pt reports to PT today c/c imbalance beginning "a couple years ago." Pt began noticing imbalance prior to any falls, but has experienced a few until beginning use of rollator. She states "holding onto something helps to keep my balance." Her husband has Parkinson's and struggles to help her around the house. She has a son and 2 grandsons that live a few blocks away, but she does not see them often. Pt notes they will ocme over to help if she is specific about the task and time. Pt notes sciatica pain in LLE that doesn't go away, about 6 mos ongoing. Pt reports hx of BLE knee replacement surgery and further notes shoulder pain. Pt has had 3 shoulder surgeries to repair a torn rotator cuff in the RUE. Pt reports tearing again, MD advised BUE reverse shoulder arthroplasty, but pt has decided against due to demands of rehabilitation and necessity for independence at home. She further notes back pain  beginning a few years ago. Pt notes she underwent an L4-5 fusion, but had to undergo a second surgery after the first one failed. Pt reports second surgery included L3-L5, but also failed to fuse. Pt notes still experiencing back pain, and later underwent surgery for a spinal cord stimulator. She notes still having it, however it will not charge so it is unusable as fixing it would require another surgery. Pt notes that MD had previously advised against the surgery due to pt's weight at the time. She has now lost enough weight, but has decided against the surgery because she "feels scared, too old, and wants to see if PT can help first."  Pain: Yes Numbness/Tingling: Yes; down LLE, N/T into toes RLE Focal Weakness: Yes Recent changes in overall health/medication: No; see hx, taking several medications Prior history of physical therapy for balance: No; prior PT for back pain Dominant hand: right Imaging: Yes  Red flags: Negative for bowel/bladder changes, saddle paresthesia, personal history of cancer, h/o spinal tumors, h/o compression fx, h/o abdominal aneurysm, abdominal pain, chills/fever, night sweats, nausea, vomiting  PRECAUTIONS: None  WEIGHT BEARING RESTRICTIONS: No  FALLS: Has patient fallen in last 6 months? 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  Recent memory is intact.  Remote memory is intact.  Attention span and concentration are intact.  Expressive speech is intact.  Patient's fund of knowledge is within normal limits for educational level.    Gross Musculoskeletal Assessment Tremor: None Bulk: Normal Tone: Normal  Posture: Formal assessment deferred  AROM Formal assessment deferred; grossly WNL for LE  LE MMT: MMT (out of 5) Right  Left   Hip flexion  4+ 4+  Hip extension    Hip abduction    Hip adduction    Hip internal rotation    Hip external rotation    Knee flexion 5 (seated position) 5 (seated position)  Knee extension 5 4+*  Ankle dorsiflexion 4* 5  Ankle plantarflexion WNL WNL  Ankle inversion    Ankle eversion    (* = pain; Blank rows = not tested)  Sensation Diminished to light touch on LLE L1 and S2. Proprioception, stereognosis, and hot/cold testing deferred on this date.  Reflexes Deferred  Cranial Nerves Deferred  Coordination/Cerebellar Deferred  Bed mobility: Deferred  Transfers: Assistive device utilized:  Rollator   Sit to stand: Modified independence Stand to sit: Modified independence Chair to chair: Modified independence Floor:  Deferred  Curb:  Deferred  Stairs: Deferred  Gait: Formal gait assessment deferred.   Functional Outcome Measures  Results Comments  BERG    DGI    FGA    TUG 10.56 seconds Using rollator  5TSTS 19.87 seconds Increased fall risk  6 Minute Walk Test    10 Meter Gait Speed Self-selected: 14.47s = 0.69 m/s Using rollator  (Blank rows = not tested)  06/29/23 ABC: 56.9% BERG: 46/56; mCTSIB: 30s in conditions 1-4 with 3+ sway in condition 4;   TODAY'S TREATMENT 07/27/23    SUBJECTIVE:  Patient reports that she is doing alright today. " I am tired. My husband's health is making it tougher. I am having hear time standing straight today." No question or pain out of [place]ine reported.     PAIN: Ongoing chronic pain;   Therapeutic Activity NuStep L1-3 x 8 minutes for BLE strengthening and warm-up during interval history; Seated marches GTB 2 x 10 reps Seated Hip Abd/ADD with GTB 2 x 10 reps  STS with GTB 2 x 10  STS on Blue foam and GTB 2 x 10 reps  Staggered STS on Blue foam 2 x 10 reps Walking with blue ball level 3 in hand x 60 secs Alternating 6" step-ups with BUE support x 10 BLE; Sit to stand 2 x 10 with no UE support  Side stepping with BTB around knees x multiple laps;    Deferred today 2/2 to having difficult straightening her posture. Neuromuscular Re-education  All exercises performed without UE support unless otherwise noted: Alternating 6" step taps x 10 BLE; Tandem gait in // bars x multiple lengths; Tandem balance alternating forward LE x 30s each; Feet together (FT) eyes open/closed x 30s each; FT eyes closed horizontal and vertical head turns x 30s each;   PATIENT EDUCATION:  Education details: Pt educated throughout session about proper posture and technique with exercises. Improved exercise technique, movement at target joints, use of target muscles after min to mod verbal, visual, tactile cues.  Person educated: Patient Education method: Explanation Education comprehension: verbalized understanding   HOME EXERCISE PROGRAM:  Access Code: [email or web address] URL: [email or web address] Date: 06/29/2023 Prepared by: Ria Comment  Exercises - Sit to Stand Without Arm Support  - 1 x daily - 7 x weekly - 3 sets - 10 reps - Heel Raises with Counter Support  - 1 x daily - 7 x weekly - 3 sets - 10 reps - 3s hold - Standing Romberg to 1/2 Tandem Stance  - 1 x daily - 7 x weekly - 4 reps - 60s hold   ASSESSMENT:  CLINICAL IMPRESSION:    Session focused on BLE strengthening, postural muscle activation and balance exercises. Deferred from NM exs  to day due to pt reported tired and weak.  Pt encouraged to follow-up as scheduled. Pt benefited from session evident by pt walked out of the GYm with improved posture.  She will benefit from PT services to address deficits in weakness, balance, and mobility in order to return to full function at home as well as decrease risk for falls.   OBJECTIVE IMPAIRMENTS: decreased activity tolerance, decreased balance, decreased coordination, and postural dysfunction.   ACTIVITY LIMITATIONS: carrying, sitting, standing, squatting, stairs, and transfers  PARTICIPATION LIMITATIONS: community activity and occupation  PERSONAL FACTORS: Age, Fitness, Past/current experiences, Time since onset of injury/illness/exacerbation, and 3+ comorbidities: lumbar and cervical radiculopathy, polyneuropathy, DDD, osteoporosis, OA, spondylosis, hx of lumbar fusion, chronic pain syndrome  are also affecting patient's functional outcome.   REHAB POTENTIAL: Fair    CLINICAL DECISION MAKING: Unstable/unpredictable  EVALUATION COMPLEXITY: High   GOALS: Goals reviewed with patient? No  SHORT TERM GOALS: Target date: 07/08/23  Pt will be independent with HEP in order to improve strength and balance in order to decrease fall risk and improve function at home. Baseline:  Goal status: INITIAL   LONG TERM GOALS: Target date: 09/16/23  1.  Pt will improve BERG by at least 3 points in order to demonstrate clinically significant improvement in balance.   Baseline: 06/29/23: Goal status: INITIAL  2.  Pt will improve ABC by at least 13% in order to demonstrate clinically significant improvement in balance confidence.      Baseline: 06/29/23: 56.9% Goal status: INITIAL  3. Pt will decrease 5TSTS by at least 3 seconds in order to demonstrate clinically significant improvement in LE strength      Baseline: 19.87s Goal  status: INITIAL  4. Pt will increase self-selected by at least 0.13 m/s in order to demonstrate clinically significant improvement in community ambulation.      Baseline: Self-selected: 14.47s = 0.69 m/s Goal status: INITIAL   PLAN: PT FREQUENCY: 2x/week  PT DURATION: 8 weeks  PLANNED INTERVENTIONS: Therapeutic exercises, Therapeutic activity, Neuromuscular re-education, Balance training, Gait training, Patient/Family education, Self Care, Joint mobilization, Joint manipulation, Vestibular training, Canalith repositioning, Orthotic/Fit training, DME instructions, Dry Needling, Electrical stimulation, Spinal manipulation, Spinal mobilization, Cryotherapy, Moist heat, Taping, Traction, Ultrasound, Ionotophoresis 4mg /ml Dexamethasone, Manual therapy, and Re-evaluation.  PLAN FOR NEXT SESSION: progress strength and balance exercises, reviewe/modify HEP as necessary.  Samanthia Berlin PT DPT 2:51 PM,07/27/23

## 2023-07-29 ENCOUNTER — Ambulatory Visit: Payer: Medicare Other

## 2023-08-02 NOTE — Therapy (Unsigned)
 OUTPATIENT PHYSICAL THERAPY BALANCE TREATMENT  Patient Name: Connie [name] MRN: [id number] DOB:[birth year], 68 y.o., female Today's Date: 08/04/2023  END OF SESSION:  PT End of Session - 08/04/23 2138     Visit Number 8    Number of Visits 25    Date for PT Re-Evaluation 09/16/23    Authorization Type eval: 06/24/23, [employer] Auth Approved 16 visits between 06/24/2023 - 08/19/2023  [facility] Medicare 2025    Authorization - Visit Number 8    Authorization - Number of Visits 16    PT Start Time 1315    PT Stop Time 1400    PT Time Calculation (min) 45 min    Equipment Utilized During Treatment Gait belt    Activity Tolerance Patient tolerated treatment well    Behavior During Therapy WFL for tasks assessed/performed             Past Medical History:  Diagnosis Date   Anemia    in the past   Anxiety    Arthritis    all over   Cervical spondylosis with myelopathy 2018   Chronic pain 2019   can't stand straight, uses walker for stability   DDD (degenerative disc disease), lumbar    Depression    Displacement of cervical intervertebral disc 2018   Dyspnea    HANDICAPPED   Fibromyalgia    Polymyalgia Rheumatica   GERD (gastroesophageal reflux disease)    Heart murmur    not treated or being followed   Hypertension    CONTROLLED   Hypothyroidism    Lumbar post-laminectomy syndrome 2018   Lumbosacral radiculitis 2018   Neuropathy due to medical condition (HCC)    feet, toes, fingers   Osteoarthritis    Osteoporosis    Other long term (current) drug therapy 2019   from pain management   Pain in the coccyx 2018   disorder of sacrum   Sleep apnea    Spondylolisthesis 2018   Spondylosis of lumbosacral region without myelopathy or radiculopathy 2018   Spondylosis with myelopathy, lumbar region 2018   Vitamin D deficiency    Past Surgical History:  Procedure Laterality Date   ABDOMINAL HYSTERECTOMY  2000   APPENDECTOMY     arm surgery  2002   BREAST BIOPSY Left 2005    benign   CATARACT EXTRACTION W/PHACO Right 10/21/2017   Procedure: CATARACT EXTRACTION PHACO AND INTRAOCULAR LENS PLACEMENT (IOC) RIGHT;  Surgeon: Lockie Mola, MD;  Location: [facility];  Service: Ophthalmology;  Laterality: Right;  neck issues   CESAREAN SECTION     x 2   CHOLECYSTECTOMY     COLONOSCOPY WITH PROPOFOL N/A 08/25/2017   Procedure: COLONOSCOPY WITH PROPOFOL;  Surgeon: Toledo, Boykin Nearing, MD;  Location: [facility];  Service: Gastroenterology;  Laterality: N/A;   COLONOSCOPY WITH PROPOFOL N/A 02/25/2022   Procedure: COLONOSCOPY WITH PROPOFOL;  Surgeon: Regis Bill, MD;  Location: [facility];  Service: Endoscopy;  Laterality: N/A;   epidural injection  2018   steroids   GASTRIC BYPASS  2004   JOINT REPLACEMENT Bilateral 2005   knees   LUMBAR FUSION  2016   x 2/ L3-5   SHOULDER ARTHROSCOPY WITH ROTATOR CUFF REPAIR Right 2008   x 3   SPINAL CORD STIMULATOR IMPLANT  2018   [employer]   TEMPORAL ARTERY BIOPSY / LIGATION     TONSILLECTOMY     Patient Active Problem List   Diagnosis Date Noted   Insomnia due to medical condition  09/16/2022   Bilateral carpal tunnel syndrome 02/12/2022   At risk for prolonged QT interval syndrome 01/16/2022   Psychosis (HCC) 01/16/2022   Flatus 08/27/2021   Diarrhea due to malabsorption 08/27/2021   Myoclonic jerking 08/06/2021   Polyneuropathy, unspecified 04/28/2021   Personal history of nicotine dependence 04/28/2021   Obstructive sleep apnea of adult 04/28/2021   Opioid type dependence, continuous (HCC) 03/13/2020   Spinal stenosis of lumbosacral region 03/13/2020   MDD (major depressive disorder), recurrent, in full remission (HCC) 12/29/2019   Hypersomnia 12/29/2019   MDD (major depressive disorder), recurrent, in partial remission (HCC) 10/27/2019   GAD (generalized anxiety disorder) 01/11/2019   Bereavement 01/11/2019   Opioid use with withdrawal (HCC) 01/11/2019   Pain syndrome, chronic  09/21/2018   Low serum vitamin B12 09/15/2018   Moderate episode of recurrent major depressive disorder (HCC) 09/15/2018   Memory loss 05/12/2018   Chronic pain of both shoulders 05/12/2018   Osteoporosis, post-menopausal 12/02/2017   Vitamin D deficiency, unspecified 07/22/2017   Candidiasis of skin and nails 07/13/2017   Menopausal symptom 07/13/2017   Bilateral hand pain 06/26/2017   CRP elevated 06/26/2017   Jaw pain, non-TMJ 06/26/2017   Temporal headache 06/26/2017   Stiffness of shoulder joint 06/26/2017   Screening for osteoporosis 06/26/2017   SOBOE (shortness of breath on exertion) 06/09/2017   Joint pain 05/20/2017   Arthritis 05/20/2017   History of lumbar fusion 04/23/2017   Lumbar degenerative disc disease 04/23/2017   Fibromyalgia 04/23/2017   Cervicalgia 04/23/2017   DDD (degenerative disc disease), cervical 04/23/2017   Chronic pain syndrome 04/23/2017   Spinal cord stimulator status 04/23/2017   Complete tear of right rotator cuff 04/17/2017   Rotator cuff tendinitis, right 04/17/2017   Rotator cuff arthropathy of both shoulders 04/12/2017   Degenerative joint disease of cervical and lumbar spine 03/17/2017   Generalized osteoarthritis of multiple sites 03/17/2017   Seasonal allergies 03/17/2017   GERD without esophagitis 01/18/2017   Hypothyroidism 01/18/2017   Fatty liver 12/08/2016   Osteoporosis 09/30/2016   Pain in the coccyx 08/19/2016   Delusional disorder (HCC) 05/27/2016   Essential hypertension 04/09/2016   Chiari malformation type I (HCC) 03/24/2016   Severe obesity (BMI >= 40) (HCC) 03/24/2016   Lumbosacral spondylosis without myelopathy 09/17/2015   Chronic midline low back pain 03/28/2015   Lumbosacral radiculitis 03/05/2015   Morgellons disease 12/01/2014   Medication-induced delirium, acute, hyperactive 11/17/2014   Fatigue 05/08/2014   Lumbar post-laminectomy syndrome 05/08/2014   Lumbar radiculopathy 05/08/2014   Cervical  radiculopathy 05/08/2014   H/O gastric bypass 03/03/2014   Dysphagia, oropharyngeal phase 05/18/2013   Acquired spondylolisthesis 08/23/2012   Nonunion of fracture 08/23/2012   Spinal stenosis of lumbar region without neurogenic claudication 08/23/2012   Dry eyes, bilateral 07/26/2012   Leg cramps 07/26/2012   Diastolic dysfunction 06/14/2012   Lumbar spondylosis 04/06/2012   Lumbar pseudoarthrosis 04/06/2012   Iron deficiency 01/14/2012   Chronic pain 12/16/2011   Edema 12/16/2011   Internal hemorrhoid 12/16/2011   OA (osteoarthritis) 12/16/2011    PCP: Wilford Corner, PA-C  REFERRING PROVIDER: Janice Coffin, PA-C   REFERRING DIAG: Imbalance  RATIONALE FOR EVALUATION AND TREATMENT: Rehabilitation  THERAPY DIAG: Difficulty in walking, not elsewhere classified  Muscle weakness (generalized)  ONSET DATE: Chronic; "a couple years"  FOLLOW-UP APPT SCHEDULED WITH REFERRING PROVIDER: No   SUBJECTIVE:  SUBJECTIVE STATEMENT:  Imbalance and unsteadiness  PERTINENT HISTORY: Pt reports to PT today c/c imbalance beginning "a couple years ago." Pt began noticing imbalance prior to any falls, but has experienced a few until beginning use of rollator. She states "holding onto something helps to keep my balance." Her husband has Parkinson's and struggles to help her around the house. She has a son and 2 grandsons that live a few blocks away, but she does not see them often. Pt notes they will ocme over to help if she is specific about the task and time. Pt notes sciatica pain in LLE that doesn't go away, about 6 mos ongoing. Pt reports hx of BLE knee replacement surgery and further notes shoulder pain. Pt has had 3 shoulder surgeries to repair a torn rotator cuff in the RUE. Pt reports tearing again, MD  advised BUE reverse shoulder arthroplasty, but pt has decided against due to demands of rehabilitation and necessity for independence at home. She further notes back pain beginning a few years ago. Pt notes she underwent an L4-5 fusion, but had to undergo a second surgery after the first one failed. Pt reports second surgery included L3-L5, but also failed to fuse. Pt notes still experiencing back pain, and later underwent surgery for a spinal cord stimulator. She notes still having it, however it will not charge so it is unusable as fixing it would require another surgery. Pt notes that MD had previously advised against the surgery due to pt's weight at the time. She has now lost enough weight, but has decided against the surgery because she "feels scared, too old, and wants to see if PT can help first."  Pain: Yes Numbness/Tingling: Yes; down LLE, N/T into toes RLE Focal Weakness: Yes Recent changes in overall health/medication: No; see hx, taking several medications Prior history of physical therapy for balance: No; prior PT for back pain Dominant hand: right Imaging: Yes  Red flags: Negative for bowel/bladder changes, saddle paresthesia, personal history of cancer, h/o spinal tumors, h/o compression fx, h/o abdominal aneurysm, abdominal pain, chills/fever, night sweats, nausea, vomiting  PRECAUTIONS: None  WEIGHT BEARING RESTRICTIONS: No  FALLS: Has patient fallen in last 6 months? 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  Recent memory is intact.  Remote memory is intact.  Attention span and concentration are intact.  Expressive speech is intact.  Patient's fund of knowledge is within normal limits for educational level.    Gross Musculoskeletal Assessment Tremor: None Bulk: Normal Tone: Normal  Posture: Formal assessment deferred  AROM Formal assessment deferred; grossly WNL for LE  LE MMT: MMT (out of 5) Right  Left   Hip flexion  4+ 4+  Hip extension    Hip abduction    Hip adduction    Hip internal rotation    Hip external rotation    Knee flexion 5 (seated position) 5 (seated position)  Knee extension 5 4+*  Ankle dorsiflexion 4* 5  Ankle plantarflexion WNL WNL  Ankle inversion    Ankle eversion    (* = pain; Blank rows = not tested)  Sensation Diminished to light touch on LLE L1 and S2. Proprioception, stereognosis, and hot/cold testing deferred on this date.  Reflexes Deferred  Cranial Nerves Deferred  Coordination/Cerebellar Deferred  Bed mobility: Deferred  Transfers: Assistive device utilized:  Rollator   Sit to stand: Modified independence Stand to sit: Modified independence Chair to chair: Modified independence Floor:  Deferred  Curb:  Deferred  Stairs: Deferred  Gait: Formal gait assessment deferred.   Functional Outcome Measures  Results Comments  BERG    DGI    FGA    TUG 10.56 seconds Using rollator  5TSTS 19.87 seconds Increased fall risk  6 Minute Walk Test    10 Meter Gait Speed Self-selected: 14.47s = 0.69 m/s Using rollator   (Blank rows = not tested)  06/29/23 ABC: 56.9% BERG: 46/56; mCTSIB: 30s in conditions 1-4 with 3+ sway in condition 4;   TODAY'S TREATMENT 08/03/23    SUBJECTIVE: Patient reports that she is doing alright today. Her [facility] tomorrow. No falls since the last therapy session but she reports some stumbles. No questions or concerns today.    PAIN: Ongoing chronic pain;   Therapeutic Activity NuStep L1-4 x 10 minutes for BLE strengthening and warm-up during interval history;  Standing exercises with 4# AW: Hip flexion marches 2 x 10 BLE; Hip abduction 2 x 10 BLE; Hamstring curls 2 x 10 BLE; Hip extension 2 x 10 BLE;  Seated LAQ with 4# AW 2 x 10 BLE; Seated clams with manual resistance from therapist x 10; Seated adductor squeeze with manual resistance from therapist x 10;   Neuromuscular Re-education  All exercises performed without UE support unless otherwise noted: Tandem balance alternating forward LE; Tandem gait in // bars x multiple lengths;   Not performed: Seated marches GTB 2 x 10 reps Seated Hip Abd/ADD with GTB 2 x 10 reps  STS with GTB 2 x 10  STS on Blue foam and GTB 2 x 10 reps  Staggered STS on Blue foam 2 x 10 reps Walking with blue ball level 3 in hand x 60 secs Alternating 6" step-ups with BUE support x 10 BLE; Sit to stand 2 x 10 with no UE support  Side stepping with BTB around knees x multiple laps;    PATIENT EDUCATION:  Education details: Pt educated throughout session about proper posture and technique with exercises. Improved exercise technique, movement at target joints, use of target muscles after min to mod verbal, visual, tactile cues.  Person educated: Patient Education method: Explanation Education comprehension: verbalized understanding   HOME EXERCISE PROGRAM:  Access Code: [email or web address] URL: [email or web address] Date: 06/29/2023 Prepared by: Ria Comment  Exercises - Sit  to Stand Without Arm Support  - 1 x daily - 7 x weekly - 3 sets - 10 reps - Heel Raises with Counter Support  - 1 x daily - 7 x weekly - 3 sets - 10 reps - 3s hold - Standing Romberg to 1/2 Tandem Stance  - 1 x daily - 7 x weekly - 4 reps -  60s hold   ASSESSMENT:  CLINICAL IMPRESSION:   Session focused on BLE strengthening and balance exercises. She demonstrates excellent effort. She does require intermittent seated rest breaks. No HEP modifications on this date. She will benefit from PT services to address deficits in weakness, balance, and mobility in order to return to full function at home as well as decrease risk for falls.   OBJECTIVE IMPAIRMENTS: decreased activity tolerance, decreased balance, decreased coordination, and postural dysfunction.   ACTIVITY LIMITATIONS: carrying, sitting, standing, squatting, stairs, and transfers  PARTICIPATION LIMITATIONS: community activity and occupation  PERSONAL FACTORS: Age, Fitness, Past/current experiences, Time since onset of injury/illness/exacerbation, and 3+ comorbidities: lumbar and cervical radiculopathy, polyneuropathy, DDD, osteoporosis, OA, spondylosis, hx of lumbar fusion, chronic pain syndrome  are also affecting patient's functional outcome.   REHAB POTENTIAL: Fair    CLINICAL DECISION MAKING: Unstable/unpredictable  EVALUATION COMPLEXITY: High   GOALS: Goals reviewed with patient? No  SHORT TERM GOALS: Target date: 07/08/23  Pt will be independent with HEP in order to improve strength and balance in order to decrease fall risk and improve function at home. Baseline:  Goal status: INITIAL   LONG TERM GOALS: Target date: 09/16/23  1.  Pt will improve BERG by at least 3 points in order to demonstrate clinically significant improvement in balance.   Baseline: 06/29/23: Goal status: INITIAL  2.  Pt will improve ABC by at least 13% in order to demonstrate clinically significant improvement in balance confidence.       Baseline: 06/29/23: 56.9% Goal status: INITIAL  3. Pt will decrease 5TSTS by at least 3 seconds in order to demonstrate clinically significant improvement in LE strength      Baseline: 19.87s Goal status: INITIAL  4. Pt will increase self-selected by at least 0.13 m/s in order to demonstrate clinically significant improvement in community ambulation.      Baseline: Self-selected: 14.47s = 0.69 m/s Goal status: INITIAL   PLAN: PT FREQUENCY: 2x/week  PT DURATION: 8 weeks  PLANNED INTERVENTIONS: Therapeutic exercises, Therapeutic activity, Neuromuscular re-education, Balance training, Gait training, Patient/Family education, Self Care, Joint mobilization, Joint manipulation, Vestibular training, Canalith repositioning, Orthotic/Fit training, DME instructions, Dry Needling, Electrical stimulation, Spinal manipulation, Spinal mobilization, Cryotherapy, Moist heat, Taping, Traction, Ultrasound, Ionotophoresis 4mg /ml Dexamethasone, Manual therapy, and Re-evaluation.  PLAN FOR NEXT SESSION: progress strength and balance exercises, reviewe/modify HEP as necessary.  Sharalyn Ink Hiba Garry PT, DPT, GCS  9:48 PM,08/04/23

## 2023-08-03 ENCOUNTER — Ambulatory Visit: Payer: Medicare Other | Attending: Student

## 2023-08-03 DIAGNOSIS — R262 Difficulty in walking, not elsewhere classified: Secondary | ICD-10-CM | POA: Diagnosis present

## 2023-08-03 DIAGNOSIS — M6281 Muscle weakness (generalized): Secondary | ICD-10-CM | POA: Insufficient documentation

## 2023-08-04 ENCOUNTER — Encounter: Payer: Medicare Other | Admitting: Student in an Organized Health Care Education/Training Program

## 2023-08-05 ENCOUNTER — Ambulatory Visit: Payer: Medicare Other

## 2023-08-05 DIAGNOSIS — G4733 Obstructive sleep apnea (adult) (pediatric): Secondary | ICD-10-CM | POA: Diagnosis not present

## 2023-08-06 ENCOUNTER — Other Ambulatory Visit: Payer: Self-pay | Admitting: Psychiatry

## 2023-08-06 DIAGNOSIS — F23 Brief psychotic disorder: Secondary | ICD-10-CM

## 2023-08-07 ENCOUNTER — Telehealth: Payer: Self-pay

## 2023-08-07 DIAGNOSIS — G4733 Obstructive sleep apnea (adult) (pediatric): Secondary | ICD-10-CM

## 2023-08-07 NOTE — Telephone Encounter (Signed)
-----   Message from [facility] D REDDY sent at 08/06/2023 12:57 PM EDT ----- Regarding: HST results Patient has moderate OSA, recommend starting on APAP therapy set to 4-16 cm H2O, EPR 3 with the Airtouch N30i nasal mask. Please also schedule 3 month CPAP follow up visit. Thanks ----- Message ----- From: Lilian Kapur Sent: 08/06/2023   7:31 AM EDT To: Alanda Slim, MD

## 2023-08-07 NOTE — Telephone Encounter (Signed)
Order has been placed. Nothing further needed. 

## 2023-08-13 ENCOUNTER — Encounter: Payer: Self-pay | Admitting: Student in an Organized Health Care Education/Training Program

## 2023-08-13 ENCOUNTER — Ambulatory Visit
Attending: Student in an Organized Health Care Education/Training Program | Admitting: Student in an Organized Health Care Education/Training Program

## 2023-08-13 VITALS — BP 108/55 | HR 55 | Temp 97.0°F | Resp 17 | Ht 60.0 in | Wt 205.0 lb

## 2023-08-13 DIAGNOSIS — Z981 Arthrodesis status: Secondary | ICD-10-CM | POA: Diagnosis present

## 2023-08-13 DIAGNOSIS — M5136 Other intervertebral disc degeneration, lumbar region with discogenic back pain only: Secondary | ICD-10-CM | POA: Diagnosis present

## 2023-08-13 DIAGNOSIS — M4807 Spinal stenosis, lumbosacral region: Secondary | ICD-10-CM | POA: Diagnosis present

## 2023-08-13 DIAGNOSIS — M5416 Radiculopathy, lumbar region: Secondary | ICD-10-CM | POA: Insufficient documentation

## 2023-08-13 DIAGNOSIS — G894 Chronic pain syndrome: Secondary | ICD-10-CM | POA: Insufficient documentation

## 2023-08-13 MED ORDER — LEVORPHANOL TARTRATE 2 MG PO TABS: 2.0000 mg | ORAL_TABLET | Freq: Three times a day (TID) | ORAL | 0 refills | Status: AC | PRN

## 2023-08-13 NOTE — Progress Notes (Signed)
 Nursing Pain Medication Assessment:  Safety precautions to be maintained throughout the outpatient stay will include: orient to surroundings, keep bed in low position, maintain call bell within reach at all times, provide assistance with transfer out of bed and ambulation.  Medication Inspection Compliance: Pill count conducted under aseptic conditions, in front of the patient. Neither the pills nor the bottle was removed from the patient's sight at any time. Once count was completed pills were immediately returned to the patient in their original bottle.  Medication:  levorphanol Pill/Patch Count:  1 of 90 pills remain Pill/Patch Appearance: Markings consistent with prescribed medication Bottle Appearance: Standard pharmacy container. Clearly labeled. Filled Date: 03 / 18 / 2025 Last Medication intake:  Today

## 2023-08-13 NOTE — Progress Notes (Signed)
 PROVIDER NOTE: Interpretation of information contained herein should be left to medically-trained personnel. Specific patient instructions are provided elsewhere under "Patient Instructions" section of medical record. This document was created in part using AI and STT-dictation technology, any transcriptional errors that may result from this process are unintentional.  Patient: Connie [name]  Service: E/M   PCP: Lenell Query, PA-C  DOB: [birth year]  DOS: 08/13/2023  Provider: Cephus Collin, MD  MRN: [id number]  Delivery: Face-to-face  Specialty: Interventional Pain Management  Type: Established Patient  Setting: Ambulatory outpatient facility  Specialty designation: 09  Referring Prov.: Lenell Query,*  Location: Outpatient office facility       HPI  [name], a 68 y.o. year old female, is here today because of her Lumbar radiculopathy [M54.16]. [name] primary complain today is Back Pain   Pain Assessment: Severity of Chronic pain is reported as a 3 /10. Location: Back Lower, Mid, Upper/to back of neck, down back and side of left leg to ankle. Onset: More than a month ago. Quality: Constant, Aching, Sharp, Stabbing. Timing: Constant. Modifying factor(s): meds. Vitals:  height is 5' (1.524 m) and weight is 205 lb (93 kg). Her temperature is 97 F (36.1 C) (abnormal). Her blood pressure is 108/55 (abnormal) and her pulse is 55 (abnormal). Her respiration is 17 and oxygen saturation is 96%.  BMI: Estimated body mass index is 40.04 kg/m as calculated from the following:   Height as of this encounter: 5' (1.524 m).   Weight as of this encounter: 205 lb (93 kg). Last encounter: 05/12/2023.  Reason for encounter: medication management. The patient doing well with the current medication regimen. No adverse reaction or side effects reported to the medication. Routine UDS ordered today. She reports low back pain; however the current medication regimen helps manage her  chronic pain.   Pharmacotherapy Assessment  Analgesic: levorphanol (Levodromoran) 2 mg tab every 8 hours as needed for pain. MME=66  Monitoring: Connie [name] PMP: PDMP reviewed during this encounter.       Pharmacotherapy: No side-effects or adverse reactions reported. Compliance: No problems identified. Effectiveness: Clinically acceptable.  Connie Rabon, RN  08/13/2023 11:24 AM  Sign when Signing Visit Nursing Pain Medication Assessment:  Safety precautions to be maintained throughout the outpatient stay will include: orient to surroundings, keep bed in low position, maintain call bell within reach at all times, provide assistance with transfer out of bed and ambulation.  Medication Inspection Compliance: Pill count conducted under aseptic conditions, in front of the patient. Neither the pills nor the bottle was removed from the patient's sight at any time. Once count was completed pills were immediately returned to the patient in their original bottle.  Medication:  levorphanol Pill/Patch Count:  1 of 90 pills remain Pill/Patch Appearance: Markings consistent with prescribed medication Bottle Appearance: Standard pharmacy container. Clearly labeled. Filled Date: 03 / 18 / 2025 Last Medication intake:  Today    No results found for: "CBDTHCR" No results found for: "D8THCCBX" No results found for: "D9THCCBX"  UDS:  Summary  Date Value Ref Range Status  06/05/2022 Note  Final    Comment:    ==================================================================== ToxASSURE Select 13 (MW) ==================================================================== Test                             Result       Flag       Units    NO DRUGS DETECTED. ==================================================================== Test  Result    Flag   Units      Ref Range   Creatinine              167              mg/dL       >=16 ==================================================================== Declared Medications:  The flagging and interpretation on this report are based on the  following declared medications.  Unexpected results may arise from  inaccuracies in the declared medications.   **Note: The testing scope of this panel does not include the  following reported medications:   Bupropion (Wellbutrin XL)  Celecoxib (Celebrex)  Furosemide  Levorphanol  Levothyroxine  Metoprolol  Omeprazole  Pregabalin (Lyrica)  Quetiapine (Seroquel)  Trazodone ==================================================================== For clinical consultation, please call [phone]. ====================================================================       ROS  Constitutional: Denies any fever or chills Gastrointestinal: No reported hemesis, hematochezia, vomiting, or acute GI distress Musculoskeletal: Denies any acute onset joint swelling, redness, loss of ROM, or weakness, low back pain  Neurological: No reported episodes of acute onset apraxia, aphasia, dysarthria, agnosia, amnesia, paralysis, loss of coordination, or loss of consciousness  Medication Review  Cyanocobalamin, QUEtiapine, azelastine, buPROPion, celecoxib, fluticasone, furosemide, levorphanol, levothyroxine, metoprolol succinate, omeprazole, pregabalin, and traZODone  History Review  Allergy: [name] has no known allergies. Drug: [name]  reports no history of drug use. Alcohol:  reports no history of alcohol use. Tobacco:  reports that she quit smoking about 16 years ago. Her smoking use included cigarettes. She started smoking about 36 years ago. She has a 10 pack-year smoking history. She has never used smokeless tobacco. Social: [name]  reports that she quit smoking about 16 years ago. Her smoking use included cigarettes. She started smoking about 36 years ago. She has a 10 pack-year smoking history. She has never used  smokeless tobacco. She reports that she does not drink alcohol and does not use drugs. Medical:  has a past medical history of Anemia, Anxiety, Arthritis, Cervical spondylosis with myelopathy (2018), Chronic pain (2019), DDD (degenerative disc disease), lumbar, Depression, Displacement of cervical intervertebral disc (2018), Dyspnea, Fibromyalgia, GERD (gastroesophageal reflux disease), Heart murmur, Hypertension, Hypothyroidism, Lumbar post-laminectomy syndrome (2018), Lumbosacral radiculitis (2018), Neuropathy due to medical condition (HCC), Osteoarthritis, Osteoporosis, Other long term (current) drug therapy (2019), Pain in the coccyx (2018), Sleep apnea, Spondylolisthesis (2018), Spondylosis of lumbosacral region without myelopathy or radiculopathy (2018), Spondylosis with myelopathy, lumbar region (2018), and Vitamin D deficiency. Surgical: Ms. Lasseter  has a past surgical history that includes Cesarean section; Gastric bypass (2004); Lumbar fusion (2016); Spinal cord stimulator implant (2018); Cholecystectomy; Appendectomy; Tonsillectomy; Shoulder arthroscopy with rotator cuff repair (Right, 2008); Joint replacement (Bilateral, 2005); Abdominal hysterectomy (2000); epidural injection (2018); arm surgery (2002); Breast biopsy (Left, 2005); Colonoscopy with propofol (N/A, 08/25/2017); Temporal artery biopsy / ligation; Cataract extraction w/PHACO (Right, 10/21/2017); and Colonoscopy with propofol (N/A, 02/25/2022). Family: family history includes Anxiety disorder in her mother; Breast cancer in her cousin, maternal aunt, and mother; Cancer in her brother, father, and mother; Depression in her mother; Hypertension in her father; Kidney disease in her mother.  Laboratory Chemistry Profile   Renal Lab Results  Component Value Date   BUN 18 06/23/2017   CREATININE 0.60 06/23/2017   GFRAA >60 06/23/2017   GFRNONAA >60 06/23/2017    Hepatic Lab Results  Component Value Date   AST 20 06/23/2017   ALT 9  (L) 06/23/2017   ALBUMIN 3.6 06/23/2017   ALKPHOS 88 06/23/2017  Electrolytes Lab Results  Component Value Date   NA 135 06/23/2017   K 4.9 06/23/2017   CL 102 06/23/2017   CALCIUM 8.5 (L) 06/23/2017    Bone No results found for: "VD25OH", "VD125OH2TOT", "OZ3086VH8", "IO9629BM8", "25OHVITD1", "25OHVITD2", "25OHVITD3", "TESTOFREE", "TESTOSTERONE"  Inflammation (CRP: Acute Phase) (ESR: Chronic Phase) No results found for: "CRP", "ESRSEDRATE", "LATICACIDVEN"       Note: Above Lab results reviewed.  Recent Imaging Review  DG PAIN CLINIC C-ARM 1-60 MIN NO REPORT Fluoro was used, but no Radiologist interpretation will be provided.  Please refer to "NOTES" tab for provider progress note. Note: Reviewed        Physical Exam  General appearance: Well nourished, well developed, and well hydrated. In no apparent acute distress Mental status: Alert, oriented x 3 (person, place, & time)       Respiratory: No evidence of acute respiratory distress Eyes: PERLA Vitals: BP (!) 108/55   Pulse (!) 55   Temp (!) 97 F (36.1 C)   Resp 17   Ht 5' (1.524 m)   Wt 205 lb (93 kg)   SpO2 96%   BMI 40.04 kg/m  BMI: Estimated body mass index is 40.04 kg/m as calculated from the following:   Height as of this encounter: 5' (1.524 m).   Weight as of this encounter: 205 lb (93 kg). Ideal: Ideal body weight: 45.5 kg (100 lb 4.9 oz) Adjusted ideal body weight: 64.5 kg (142 lb 3 oz)  Assessment   Diagnosis Status  1. Lumbar radiculopathy   2. Spinal stenosis of lumbosacral region   3. History of lumbar fusion   4. Chronic pain syndrome   5. Degeneration of intervertebral disc of lumbar region with discogenic back pain    Controlled Controlled Controlled   Plan of Care  Assessment and Plan We will continue on the current medication regimen; however the patient will be monitored for efficacy and tolerance.  Prescribing drug monitoring (PDMP) consistent with the current medication regimen.  Routine UDS ordered today.   Pharmacotherapy (Medications Ordered): Meds ordered this encounter  Medications   levorphanol (LEVODROMORAN) 2 MG tablet    Sig: Take 1 tablet (2 mg total) by mouth every 8 (eight) hours as needed for pain.    Dispense:  90 tablet    Refill:  0   levorphanol (LEVODROMORAN) 2 MG tablet    Sig: Take 1 tablet (2 mg total) by mouth every 8 (eight) hours as needed for pain.    Dispense:  90 tablet    Refill:  0   levorphanol (LEVODROMORAN) 2 MG tablet    Sig: Take 1 tablet (2 mg total) by mouth every 8 (eight) hours as needed for pain.    Dispense:  90 tablet    Refill:  0   Orders:  Orders Placed This Encounter  Procedures   ToxASSURE Select 13 (MW), Urine    Volume: 30 ml(s). Minimum 3 ml of urine is needed. Document temperature of fresh sample. Indications: Long term (current) use of opiate analgesic (U13.244)    Release to patient:   Immediate   Follow-up plan:   Return in about 3 months (around 11/12/2023) for (F2F), (MM), Marthe Slain NP.    Recent Visits No visits were found meeting these conditions. Showing recent visits within past 90 days and meeting all other requirements Today's Visits Date Type Provider Dept  08/13/23 Office Visit Cephus Collin, MD [facility]  Showing today's visits and meeting all other requirements Future  Appointments Date Type Provider Dept  11/03/23 Appointment Nettie Wyffels K, NP [facility]  Showing future appointments within next 90 days and meeting all other requirements  I discussed the assessment and treatment plan with the patient. The patient was provided an opportunity to ask questions and all were answered. The patient agreed with the plan and demonstrated an understanding of the instructions.  Patient advised to call back or seek an in-person evaluation if the symptoms or condition worsens.  Duration of encounter: 30 minutes.  Total time on encounter, as per AMA guidelines included  both the face-to-face and non-face-to-face time personally spent by the physician and/or other qualified health care professional(s) on the day of the encounter (includes time in activities that require the physician or other qualified health care professional and does not include time in activities normally performed by clinical staff). Physician's time may include the following activities when performed: Preparing to see the patient (e.g., pre-charting review of records, searching for previously ordered imaging, lab work, and nerve conduction tests) Review of prior analgesic pharmacotherapies. Reviewing PMP Interpreting ordered tests (e.g., lab work, imaging, nerve conduction tests) Performing post-procedure evaluations, including interpretation of diagnostic procedures Obtaining and/or reviewing separately obtained history Performing a medically appropriate examination and/or evaluation Counseling and educating the patient/family/caregiver Ordering medications, tests, or procedures Referring and communicating with other health care professionals (when not separately reported) Documenting clinical information in the electronic or other health record Independently interpreting results (not separately reported) and communicating results to the patient/ family/caregiver Care coordination (not separately reported)  Note by: Cephus Collin, MD (TTS and AI technology used. I apologize for any typographical errors that were not detected and corrected.) Date: 08/13/2023; Time: 12:14 PM

## 2023-08-18 LAB — TOXASSURE SELECT 13 (MW), URINE

## 2023-08-20 ENCOUNTER — Ambulatory Visit

## 2023-08-27 ENCOUNTER — Other Ambulatory Visit: Payer: Self-pay | Admitting: Internal Medicine

## 2023-08-27 DIAGNOSIS — R079 Chest pain, unspecified: Secondary | ICD-10-CM

## 2023-09-08 ENCOUNTER — Other Ambulatory Visit: Payer: Self-pay

## 2023-09-08 ENCOUNTER — Ambulatory Visit: Payer: Medicare Other | Admitting: Psychiatry

## 2023-09-08 ENCOUNTER — Encounter: Payer: Self-pay | Admitting: Psychiatry

## 2023-09-08 VITALS — BP 120/76 | HR 57 | Temp 96.7°F | Ht 60.0 in | Wt 211.8 lb

## 2023-09-08 DIAGNOSIS — G4701 Insomnia due to medical condition: Secondary | ICD-10-CM | POA: Diagnosis not present

## 2023-09-08 DIAGNOSIS — F3342 Major depressive disorder, recurrent, in full remission: Secondary | ICD-10-CM | POA: Diagnosis not present

## 2023-09-08 DIAGNOSIS — F411 Generalized anxiety disorder: Secondary | ICD-10-CM | POA: Diagnosis not present

## 2023-09-08 DIAGNOSIS — F23 Brief psychotic disorder: Secondary | ICD-10-CM | POA: Diagnosis not present

## 2023-09-08 NOTE — Patient Instructions (Signed)
  [email or web address]  www.[email or web address]  [email or web address] [facility] [address], [place], [place] [address]   [phone]  [facility] [email or web address] [address], [place], [place] [address]  [phone]  [facility] [email or web address] [address], [place], [place] [address]  [phone]   [facility] - [phone]  [facility] - [phone]  [facility] - [phone] counseling [phone]  [facility] [phone]   [email or web address] this clinician can offer telehealth and has a sliding scale option  [email or web address] this group also offers sliding scale rates and is based out of [place]  Dr. Liborio Nixon with the [facility] specializes in divorce  [employer] [facility] has interns who offer sliding scale rates and some of the full time clinicians do, as well. You complete their contact form on their website and the referrals coordinator will help to get connected to someone   [email or web address] [phone]  Medicaid below :  [facility] [address] [place], [place] [address]  [phone]    [employer] [address] [place], [place] [address]  [phone]    [facility] [address] [place], [place] [address]  [phone]

## 2023-09-08 NOTE — Progress Notes (Unsigned)
 [facility] MD OP Progress Note  09/08/2023 3:22 PM Connie [name]  MRN:  [id number]  Chief Complaint:  Chief Complaint  Patient presents with   Follow-up   Anxiety   Depression   Medication Refill   Discussed the use of AI scribe software for clinical note transcription with the patient, who gave verbal consent to proceed.  History of Present Illness Connie [name] "Connie [name]" is a 68 year old Caucasian female, married, lives in [place], has a history of MDD, GAD, brief psychotic disorder, insomnia, obstructive sleep apnea currently not on CPAP, fibromyalgia, hearing loss, hypothyroidism, polyarthralgia was evaluated in office today for a follow-up appointment.  She is experiencing an exacerbation of her major depression, primarily due to her husband's health issues, which have increased her stress levels. She feels overwhelmed by household responsibilities as her husband is unable to assist, spending most of his time sleeping. This situation has led to feelings of isolation and a lack of communication with her husband, further contributing to her depressive symptoms. She is currently taking Wellbutrin  and Seroquel  every other day.  Denies suicidal thoughts or thoughts of harming others.  Her husband has been diagnosed with Parkinson's disease, which has been distressing for her. Initially, there were concerns about ALS, but Parkinson's was confirmed. His condition has significantly impacted her daily life, as he sleeps excessively and has lost interest in personal hygiene, requiring her encouragement to shower. Parkinson's disease runs in his family.She experiences anxiety related to her husband's health and her caregiving responsibilities. She is not currently on any medication for anxiety but prefers to manage her symptoms without additional pharmacotherapy. She declined daily anxiety medication.  She has a history of sleep apnea, confirmed by a recent sleep study, and is awaiting a CPAP machine. She  previously used a CPAP machine but experienced issues with air pressure adjustments causing mask leaks and discomfort, disrupting her sleep. This occurred approximately a year and a half ago.  She reports hearing difficulties and uses hearing aids, which she removed during the visit due to interference with phone calls. She is attempting to manage her weight and has been prescribed Zepbound for weight loss, pending insurance approval. Previous medications were not covered by insurance.     Visit Diagnosis:    ICD-10-CM   1. Recurrent major depressive disorder, in full remission (HCC)  F33.42     2. GAD (generalized anxiety disorder)  F41.1     3. Brief psychotic disorder (HCC)  F23     4. Insomnia due to medical condition  G47.01    Pain, RLS, sleep apnea currently not on CPAP      Past Psychiatric History: I have reviewed past psychiatric history from progress note on 08/06/2017.  Past trials of Latuda, Wellbutrin , Cymbalta .  Past Medical History:  Past Medical History:  Diagnosis Date   Anemia    in the past   Anxiety    Arthritis    all over   Cervical spondylosis with myelopathy 2018   Chronic pain 2019   can't stand straight, uses walker for stability   DDD (degenerative disc disease), lumbar    Depression    Displacement of cervical intervertebral disc 2018   Dyspnea    HANDICAPPED   Fibromyalgia    Polymyalgia Rheumatica   GERD (gastroesophageal reflux disease)    Heart murmur    not treated or being followed   Hypertension    CONTROLLED   Hypothyroidism    Lumbar post-laminectomy syndrome 2018  Lumbosacral radiculitis 2018   Neuropathy due to medical condition (HCC)    feet, toes, fingers   Osteoarthritis    Osteoporosis    Other long term (current) drug therapy 2019   from pain management   Pain in the coccyx 2018   disorder of sacrum   Sleep apnea    Spondylolisthesis 2018   Spondylosis of lumbosacral region without myelopathy or radiculopathy 2018    Spondylosis with myelopathy, lumbar region 2018   Vitamin D deficiency     Past Surgical History:  Procedure Laterality Date   ABDOMINAL HYSTERECTOMY  2000   APPENDECTOMY     arm surgery  2002   BREAST BIOPSY Left 2005   benign   CATARACT EXTRACTION W/PHACO Right 10/21/2017   Procedure: CATARACT EXTRACTION PHACO AND INTRAOCULAR LENS PLACEMENT (IOC) RIGHT;  Surgeon: Annell Kidney, MD;  Location: [facility];  Service: Ophthalmology;  Laterality: Right;  neck issues   CESAREAN SECTION     x 2   CHOLECYSTECTOMY     COLONOSCOPY WITH PROPOFOL  N/A 08/25/2017   Procedure: COLONOSCOPY WITH PROPOFOL ;  Surgeon: Toledo, Alphonsus Jeans, MD;  Location: [facility];  Service: Gastroenterology;  Laterality: N/A;   COLONOSCOPY WITH PROPOFOL  N/A 02/25/2022   Procedure: COLONOSCOPY WITH PROPOFOL ;  Surgeon: Shane Darling, MD;  Location: [facility];  Service: Endoscopy;  Laterality: N/A;   epidural injection  2018   steroids   GASTRIC BYPASS  2004   JOINT REPLACEMENT Bilateral 2005   knees   LUMBAR FUSION  2016   x 2/ L3-5   SHOULDER ARTHROSCOPY WITH ROTATOR CUFF REPAIR Right 2008   x 3   SPINAL CORD STIMULATOR IMPLANT  2018   [employer]   TEMPORAL ARTERY BIOPSY / LIGATION     TONSILLECTOMY      Family Psychiatric History: I have reviewed family psychiatric history from progress note on 08/06/2017.  Family History:  Family History  Problem Relation Age of Onset   Kidney disease Mother    Cancer Mother    Anxiety disorder Mother    Depression Mother    Breast cancer Mother    Cancer Father    Hypertension Father    Cancer Brother    Breast cancer Maternal Aunt    Breast cancer Cousin     Social History: I have reviewed social history from progress note on 08/06/2017. Social History   Socioeconomic History   Marital status: Married    Spouse name: brady    Number of children: 2   Years of education: Not on file   Highest education level: High school  graduate  Occupational History    Comment: disabled   Tobacco Use   Smoking status: Former    Current packs/day: 0.00    Average packs/day: 0.5 packs/day for 20.0 years (10.0 ttl pk-yrs)    Types: Cigarettes    Start date: 05/21/1987    Quit date: 05/21/2007    Years since quitting: 16.3   Smokeless tobacco: Never  Vaping Use   Vaping status: Never Used  Substance and Sexual Activity   Alcohol use: No   Drug use: No    Comment: managed by pain clinic   Sexual activity: Not Currently  Other Topics Concern   Not on file  Social History Narrative   Not on file   Social Drivers of Health   Financial Resource Strain: Medium Risk (05/20/2017)   Overall Financial Resource Strain (CARDIA)    Difficulty of Paying Living Expenses: Somewhat  hard  Food Insecurity: No Food Insecurity (05/20/2017)   Hunger Vital Sign    Worried About Running Out of Food in the Last Year: Never true    Ran Out of Food in the Last Year: Never true  Transportation Needs: No Transportation Needs (05/20/2017)   PRAPARE - Administrator, Civil Service (Medical): No    Lack of Transportation (Non-Medical): No  Physical Activity: Inactive (05/20/2017)   Exercise Vital Sign    Days of Exercise per Week: 0 days    Minutes of Exercise per Session: 0 min  Stress: Not on file  Social Connections: Moderately Integrated (05/20/2017)   Social Connection and Isolation Panel [NHANES]    Frequency of Communication with Friends and Family: Three times a week    Frequency of Social Gatherings with Friends and Family: Once a week    Attends Religious Services: More than 4 times per year    Active Member of [employer] or Organizations: No    Attends Engineer, structural: Never    Marital Status: Married    Allergies: No Known Allergies  Metabolic Disorder Labs: No results found for: "HGBA1C", "MPG" No results found for: "PROLACTIN" No results found for: "CHOL", "TRIG", "HDL", "CHOLHDL", "VLDL",  "LDLCALC" No results found for: "TSH"  Therapeutic Level Labs: No results found for: "LITHIUM" No results found for: "VALPROATE" No results found for: "CBMZ"  Current Medications: Current Outpatient Medications  Medication Sig Dispense Refill   azelastine (ASTELIN) 0.1 % nasal spray Place into the nose.     buPROPion  (WELLBUTRIN  XL) 150 MG 24 hr tablet Take 1 tablet (150 mg total) by mouth daily. 90 tablet 3   celecoxib  (CELEBREX ) 100 MG capsule Take by mouth.     Cyanocobalamin (VITAMIN B-12 IJ) Inject as directed. Once a month     fluticasone (FLONASE) 50 MCG/ACT nasal spray Place into the nose.     furosemide (LASIX) 40 MG tablet Take by mouth.     levorphanol (LEVODROMORAN) 2 MG tablet Take 1 tablet (2 mg total) by mouth every 8 (eight) hours as needed for pain. 90 tablet 0   [START ON 09/12/2023] levorphanol (LEVODROMORAN) 2 MG tablet Take 1 tablet (2 mg total) by mouth every 8 (eight) hours as needed for pain. 90 tablet 0   [START ON 10/12/2023] levorphanol (LEVODROMORAN) 2 MG tablet Take 1 tablet (2 mg total) by mouth every 8 (eight) hours as needed for pain. 90 tablet 0   levothyroxine (SYNTHROID) 50 MCG tablet      metoprolol succinate (TOPROL-XL) 100 MG 24 hr tablet Take 100 mg by mouth once daily in the evening  0   omeprazole (PRILOSEC) 20 MG capsule Take 20 mg by mouth daily.     pregabalin (LYRICA) 150 MG capsule Take 150 mg by mouth 3 (three) times daily.     QUEtiapine  (SEROQUEL ) 50 MG tablet TAKE 1 TABLET(50 MG) BY MOUTH AT BEDTIME AS NEEDED 90 tablet 1   traZODone  (DESYREL ) 50 MG tablet TAKE 1 TO 2 TABLETS(50 TO 100 MG) BY MOUTH AT BEDTIME AS NEEDED FOR SLEEP 180 tablet 2   No current facility-administered medications for this visit.     Musculoskeletal: Strength & Muscle Tone: within normal limits Gait & Station: Uses a walker  Patient leans: Front  Psychiatric Specialty Exam: Review of Systems  Psychiatric/Behavioral:  Positive for sleep disturbance. The  patient is nervous/anxious.     Blood pressure 120/76, pulse (!) 57, temperature (!) 96.7 F (35.9 C),  temperature source Temporal, height 5' (1.524 m), weight 211 lb 12.8 oz (96.1 kg).Body mass index is 41.36 kg/m.  General Appearance: Casual  Eye Contact:  Fair  Speech:  Clear and Coherent  Volume:  Normal  Mood:  Anxious  Affect:  Congruent  Thought Process:  Goal Directed and Descriptions of Associations: Intact  Orientation:  Full (Time, Place, and Person)  Thought Content: Logical   Suicidal Thoughts:  No  Homicidal Thoughts:  No  Memory:  Immediate;   Fair Recent;   Fair Remote;   Fair  Judgement:  Fair  Insight:  Fair  Psychomotor Activity:  Normal  Concentration:  Concentration: Fair and Attention Span: Fair  Recall:  [employer] of Knowledge: Fair  Language: Fair  Akathisia:  No  Handed:  Right  AIMS (if indicated): not done  Assets:  Desire for Improvement Housing Social Support Transportation  ADL's:  Intact  Cognition: WNL  Sleep:  Poor   Screenings: Geneticist, molecular Office Visit from 09/16/2022 in [facility] Office Visit from 06/17/2022 in [facility] Office Visit from 04/16/2022 in [facility] Office Visit from 02/12/2022 in [facility] Office Visit from 01/16/2022 in [facility]  AIMS Total Score 0 0 0 0 0      GAD-7    Flowsheet Row Office Visit from 09/08/2023 in [facility] Office Visit from 11/18/2022 in [facility] Office Visit from 09/16/2022 in [facility] Office Visit from 06/17/2022 in [facility] Office Visit from 04/16/2022 in [facility]   Total GAD-7 Score 3 0 0 0 2      PHQ2-9    Flowsheet Row Office Visit from 09/08/2023 in [facility] Office Visit from 08/13/2023 in [facility] Video Visit from 06/11/2023 in [facility] Office Visit from 05/12/2023 in [facility] Procedure visit from 03/16/2023 in [facility] Total Score 2 0 0 0 0  PHQ-9 Total Score 5 -- -- -- --      Flowsheet Row Office Visit from 09/08/2023 in [facility] Video Visit from 06/11/2023 in [facility] Video Visit from 01/12/2023 in [facility]  C-SSRS RISK CATEGORY No Risk No Risk No Risk        Assessment and Plan: Connie [name] is a 68 year old Caucasian female with history of MDD, GAD, chronic pain, hypothyroidism was evaluated in office today.  Discussed assessment and plan as noted below.  Insomnia-unstable She had sleep study completed and was really diagnosed with sleep apnea currently awaiting CPAP.  She has had troubles with CPAP mask previously and is worried about that.  Currently on trazodone  at bedtime as well as Seroquel  which she uses as needed. - Continue to follow up with pulmonology for CPAP  - Discussed the need for management of sleep apnea to avoid cardiovascular complications and memory problems. - Continue Trazodone  50-100 mg at bedtime as needed for now.  MDD in remission Currently denies any significant depression symptoms. - Continue Wellbutrin  XL 150 mg daily-reduced dosage - Continue Seroquel  50  mg at bedtime as needed.  Uses it rarely.  History of brief psychotic disorder-resolved Currently denies any perceptual  disturbances. - Does have Seroquel  available as needed  Generalized anxiety disorder-unstable She does worry about her husband who is currently struggling with health issues.  Agreeable to start CBT.  Declines medication management. - Referral for CBT, provided resources in the community.  Follow-up Follow-up in clinic in 3 months or sooner if needed.  Collaboration of Care: Collaboration of Care: Referral or follow-up with counselor/therapist AEB patient encouraged to establish care with therapist.  Patient/Guardian was advised Release of Information must be obtained prior to any record release in order to collaborate their care with an outside provider. Patient/Guardian was advised if they have not already done so to contact the registration department to sign all necessary forms in order for us  to release information regarding their care.   Consent: Patient/Guardian gives verbal consent for treatment and assignment of benefits for services provided during this visit. Patient/Guardian expressed understanding and agreed to proceed.   This note was generated in part or whole with voice recognition software. Voice recognition is usually quite accurate but there are transcription errors that can and very often do occur. I apologize for any typographical errors that were not detected and corrected.    Naquan Garman, MD 09/09/2023, 8:28 AM

## 2023-09-10 ENCOUNTER — Ambulatory Visit

## 2023-09-14 ENCOUNTER — Ambulatory Visit
Admission: RE | Admit: 2023-09-14 | Discharge: 2023-09-14 | Disposition: A | Discharge status: Home / Self Care | Source: Ambulatory Visit | Attending: Internal Medicine | Admitting: Internal Medicine

## 2023-09-14 DIAGNOSIS — R079 Chest pain, unspecified: Secondary | ICD-10-CM | POA: Insufficient documentation

## 2023-09-14 MED ORDER — TECHNETIUM TC 99M TETROFOSMIN IV KIT
10.9000 | PACK | Freq: Once | INTRAVENOUS | Status: AC | PRN
  Administered 2023-09-14: 10.9 via INTRAVENOUS

## 2023-09-14 MED ORDER — REGADENOSON 0.4 MG/5ML IV SOLN
0.4000 mg | Freq: Once | INTRAVENOUS | Status: AC
  Administered 2023-09-14: 0.4 mg via INTRAVENOUS

## 2023-09-14 MED ORDER — TECHNETIUM TC 99M TETROFOSMIN IV KIT
27.4100 | PACK | Freq: Once | INTRAVENOUS | Status: AC | PRN
  Administered 2023-09-14: 27.41 via INTRAVENOUS

## 2023-09-16 LAB — NM MYOCAR MULTI W/SPECT W/WALL MOTION / EF
Base ST Depression (mm): 0 mm
Estimated workload: 1
Exercise duration (min): 1 min
Exercise duration (sec): 0 s
LV dias vol: 96 mL (ref 46–106)
LV sys vol: 27 mL
MPHR: 154 {beats}/min
Nuc Stress EF: 72 %
Peak HR: 69 {beats}/min
Percent HR: 44 %
Rest HR: 51 {beats}/min
Rest Nuclear Isotope Dose: 10.9 mCi
SDS: 0
SRS: 4
SSS: 0
ST Depression (mm): 0 mm
Stress Nuclear Isotope Dose: 27.4 mCi
TID: 0.77

## 2023-09-17 ENCOUNTER — Ambulatory Visit

## 2023-09-17 DIAGNOSIS — R262 Difficulty in walking, not elsewhere classified: Secondary | ICD-10-CM | POA: Diagnosis present

## 2023-09-17 DIAGNOSIS — M6281 Muscle weakness (generalized): Secondary | ICD-10-CM | POA: Insufficient documentation

## 2023-09-17 NOTE — Therapy (Signed)
 OUTPATIENT PHYSICAL THERAPY BALANCE TREATMENT/RECERTIFICATION  Patient Name: Connie [name] MRN: [id number] DOB:1955/11/26, 68 y.o., female Today's Date: 09/18/2023  END OF SESSION:  PT End of Session - 09/17/23 1538     Visit Number 9    Number of Visits 41    Date for PT Re-Evaluation 11/12/23    Authorization Type eval: 06/24/23,[employer] AUTH APPROVED [id number] visits between 09/10/23 - 11/05/23  [facility] Medicare 2025    Authorization - Visit Number 1    Authorization - Number of Visits 6    PT Start Time 1533    PT Stop Time 1615    PT Time Calculation (min) 42 min    Equipment Utilized During Treatment Gait belt    Activity Tolerance Patient tolerated treatment well    Behavior During Therapy WFL for tasks assessed/performed            Past Medical History:  Diagnosis Date   Anemia    in the past   Anxiety    Arthritis    all over   Cervical spondylosis with myelopathy 2018   Chronic pain 2019   can't stand straight, uses walker for stability   DDD (degenerative disc disease), lumbar    Depression    Displacement of cervical intervertebral disc 2018   Dyspnea    HANDICAPPED   Fibromyalgia    Polymyalgia Rheumatica   GERD (gastroesophageal reflux disease)    Heart murmur    not treated or being followed   Hypertension    CONTROLLED   Hypothyroidism    Lumbar post-laminectomy syndrome 2018   Lumbosacral radiculitis 2018   Neuropathy due to medical condition (HCC)    feet, toes, fingers   Osteoarthritis    Osteoporosis    Other long term (current) drug therapy 2019   from pain management   Pain in the coccyx 2018   disorder of sacrum   Sleep apnea    Spondylolisthesis 2018   Spondylosis of lumbosacral region without myelopathy or radiculopathy 2018   Spondylosis with myelopathy, lumbar region 2018   Vitamin D deficiency    Past Surgical History:  Procedure Laterality Date   ABDOMINAL HYSTERECTOMY  2000   APPENDECTOMY     arm surgery  2002   BREAST  BIOPSY Left 2005   benign   CATARACT EXTRACTION W/PHACO Right 10/21/2017   Procedure: CATARACT EXTRACTION PHACO AND INTRAOCULAR LENS PLACEMENT (IOC) RIGHT;  Surgeon: Annell Kidney, MD;  Location: [facility];  Service: Ophthalmology;  Laterality: Right;  neck issues   CESAREAN SECTION     x 2   CHOLECYSTECTOMY     COLONOSCOPY WITH PROPOFOL  N/A 08/25/2017   Procedure: COLONOSCOPY WITH PROPOFOL ;  Surgeon: Toledo, Alphonsus Jeans, MD;  Location: [facility];  Service: Gastroenterology;  Laterality: N/A;   COLONOSCOPY WITH PROPOFOL  N/A 02/25/2022   Procedure: COLONOSCOPY WITH PROPOFOL ;  Surgeon: Shane Darling, MD;  Location: [facility];  Service: Endoscopy;  Laterality: N/A;   epidural injection  2018   steroids   GASTRIC BYPASS  2004   JOINT REPLACEMENT Bilateral 2005   knees   LUMBAR FUSION  2016   x 2/ L3-5   SHOULDER ARTHROSCOPY WITH ROTATOR CUFF REPAIR Right 2008   x 3   SPINAL CORD STIMULATOR IMPLANT  2018   [employer]   TEMPORAL ARTERY BIOPSY / LIGATION     TONSILLECTOMY     Patient Active Problem List   Diagnosis Date Noted   Insomnia due to medical condition 09/16/2022  Bilateral carpal tunnel syndrome 02/12/2022   At risk for prolonged QT interval syndrome 01/16/2022   Psychosis (HCC) 01/16/2022   Flatus 08/27/2021   Diarrhea due to malabsorption 08/27/2021   Myoclonic jerking 08/06/2021   Polyneuropathy, unspecified 04/28/2021   Personal history of nicotine dependence 04/28/2021   Obstructive sleep apnea of adult 04/28/2021   Opioid type dependence, continuous (HCC) 03/13/2020   Spinal stenosis of lumbosacral region 03/13/2020   MDD (major depressive disorder), recurrent, in full remission (HCC) 12/29/2019   Hypersomnia 12/29/2019   MDD (major depressive disorder), recurrent, in partial remission (HCC) 10/27/2019   GAD (generalized anxiety disorder) 01/11/2019   Bereavement 01/11/2019   Opioid use with withdrawal (HCC) 01/11/2019   Pain  syndrome, chronic 09/21/2018   Low serum vitamin B12 09/15/2018   Moderate episode of recurrent major depressive disorder (HCC) 09/15/2018   Memory loss 05/12/2018   Chronic pain of both shoulders 05/12/2018   Osteoporosis, post-menopausal 12/02/2017   Vitamin D deficiency, unspecified 07/22/2017   Candidiasis of skin and nails 07/13/2017   Menopausal symptom 07/13/2017   Bilateral hand pain 06/26/2017   CRP elevated 06/26/2017   Jaw pain, non-TMJ 06/26/2017   Temporal headache 06/26/2017   Stiffness of shoulder joint 06/26/2017   Screening for osteoporosis 06/26/2017   SOBOE (shortness of breath on exertion) 06/09/2017   Joint pain 05/20/2017   Arthritis 05/20/2017   History of lumbar fusion 04/23/2017   Lumbar degenerative disc disease 04/23/2017   Fibromyalgia 04/23/2017   Cervicalgia 04/23/2017   DDD (degenerative disc disease), cervical 04/23/2017   Chronic pain syndrome 04/23/2017   Spinal cord stimulator status 04/23/2017   Complete tear of right rotator cuff 04/17/2017   Rotator cuff tendinitis, right 04/17/2017   Rotator cuff arthropathy of both shoulders 04/12/2017   Degenerative joint disease of cervical and lumbar spine 03/17/2017   Generalized osteoarthritis of multiple sites 03/17/2017   Seasonal allergies 03/17/2017   GERD without esophagitis 01/18/2017   Hypothyroidism 01/18/2017   Fatty liver 12/08/2016   Osteoporosis 09/30/2016   Pain in the coccyx 08/19/2016   Delusional disorder (HCC) 05/27/2016   Essential hypertension 04/09/2016   Chiari malformation type I (HCC) 03/24/2016   Severe obesity (BMI >= 40) (HCC) 03/24/2016   Lumbosacral spondylosis without myelopathy 09/17/2015   Chronic midline low back pain 03/28/2015   Lumbosacral radiculitis 03/05/2015   Morgellons disease 12/01/2014   Medication-induced delirium, acute, hyperactive 11/17/2014   Fatigue 05/08/2014   Lumbar post-laminectomy syndrome 05/08/2014   Lumbar radiculopathy 05/08/2014    Cervical radiculopathy 05/08/2014   H/O gastric bypass 03/03/2014   Dysphagia, oropharyngeal phase 05/18/2013   Acquired spondylolisthesis 08/23/2012   Nonunion of fracture 08/23/2012   Spinal stenosis of lumbar region without neurogenic claudication 08/23/2012   Dry eyes, bilateral 07/26/2012   Leg cramps 07/26/2012   Diastolic dysfunction 06/14/2012   Lumbar spondylosis 04/06/2012   Lumbar pseudoarthrosis 04/06/2012   Iron deficiency 01/14/2012   Chronic pain 12/16/2011   Edema 12/16/2011   Internal hemorrhoid 12/16/2011   OA (osteoarthritis) 12/16/2011    PCP: Lenell Query, PA-C  REFERRING PROVIDER: Paich, Kaitlin, PA-C   REFERRING DIAG: Imbalance  RATIONALE FOR EVALUATION AND TREATMENT: Rehabilitation  THERAPY DIAG: Difficulty in walking, not elsewhere classified  Muscle weakness (generalized)  ONSET DATE: Chronic; "a couple years"  FOLLOW-UP APPT SCHEDULED WITH REFERRING PROVIDER: No   SUBJECTIVE:  SUBJECTIVE STATEMENT:  Imbalance and unsteadiness  PERTINENT HISTORY: Pt reports to PT today c/c imbalance beginning "a couple years ago." Pt began noticing imbalance prior to any falls, but has experienced a few until beginning use of rollator. She states "holding onto something helps to keep my balance." Her husband has Parkinson's and struggles to help her around the house. She has a son and 2 grandsons that live a few blocks away, but she does not see them often. Pt notes they will ocme over to help if she is specific about the task and time. Pt notes sciatica pain in LLE that doesn't go away, about 6 mos ongoing. Pt reports hx of BLE knee replacement surgery and further notes shoulder pain. Pt has had 3 shoulder surgeries to repair a torn rotator cuff in the RUE. Pt reports tearing  again, MD advised BUE reverse shoulder arthroplasty, but pt has decided against due to demands of rehabilitation and necessity for independence at home. She further notes back pain beginning a few years ago. Pt notes she underwent an L4-5 fusion, but had to undergo a second surgery after the first one failed. Pt reports second surgery included L3-L5, but also failed to fuse. Pt notes still experiencing back pain, and later underwent surgery for a spinal cord stimulator. She notes still having it, however it will not charge so it is unusable as fixing it would require another surgery. Pt notes that MD had previously advised against the surgery due to pt's weight at the time. She has now lost enough weight, but has decided against the surgery because she "feels scared, too old, and wants to see if PT can help first."  Pain: Yes Numbness/Tingling: Yes; down LLE, N/T into toes RLE Focal Weakness: Yes Recent changes in overall health/medication: No; see hx, taking several medications Prior history of physical therapy for balance: No; prior PT for back pain Dominant hand: right Imaging: Yes  Red flags: Negative for bowel/bladder changes, saddle paresthesia, personal history of cancer, h/o spinal tumors, h/o compression fx, h/o abdominal aneurysm, abdominal pain, chills/fever, night sweats, nausea, vomiting  PRECAUTIONS: None  WEIGHT BEARING RESTRICTIONS: No  FALLS: Has patient fallen in last 6 months? 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  Recent memory is intact.  Remote memory is intact.  Attention span and concentration are intact.  Expressive speech is intact.  Patient's fund of knowledge is within normal limits for educational level.    Gross Musculoskeletal Assessment Tremor: None Bulk: Normal Tone: Normal  Posture: Formal assessment deferred  AROM Formal assessment deferred; grossly WNL for LE  LE MMT: MMT (out of 5) Right  Left   Hip flexion 4+  4+  Hip extension    Hip abduction    Hip adduction    Hip internal rotation    Hip external rotation    Knee flexion 5 (seated position) 5 (seated position)  Knee extension 5 4+*  Ankle dorsiflexion 4* 5  Ankle plantarflexion WNL WNL  Ankle inversion    Ankle eversion    (* = pain; Blank rows = not tested)  Sensation Diminished to light touch on LLE L1 and S2. Proprioception, stereognosis, and hot/cold testing deferred on this date.  Reflexes Deferred  Cranial Nerves Deferred  Coordination/Cerebellar Deferred  Bed mobility: Deferred  Transfers: Assistive device utilized: Rollator  Sit to stand: Modified independence Stand to sit: Modified independence Chair to chair: Modified independence Floor: Deferred  Curb:  Deferred  Stairs: Deferred  Gait: Formal gait assessment deferred.   Functional Outcome Measures  Results Comments  BERG    DGI    FGA    TUG 10.56 seconds Using rollator  5TSTS 19.87 seconds Increased fall risk  6 Minute Walk Test    10 Meter Gait Speed Self-selected: 14.47s = 0.69 m/s Using  rollator  (Blank rows = not tested)  06/29/23 ABC: 56.9% BERG: 46/56; mCTSIB: 30s in conditions 1-4 with 3+ sway in condition 4;   TODAY'S TREATMENT     SUBJECTIVE: Patient reports that she is doing alright today. No falls reported since the last therapy session. No significant changes in health and no questions/concerns today.    PAIN: Ongoing chronic pain;   Therapeutic Activity NuStep L1-4 x 10 minutes for BLE strengthening and warm-up during interval history;  Updated outcome measures with patient: BERG: 46/56; 5TSTS: 12.0s 64m gait speed: ABC: 77.5%  Standing exercises with 4# AW: Hip flexion marches x 10 BLE; Hamstring curls 2 x 10 BLE;  Seated LAQ with 4# AW x 10 BLE;   Not performed: Seated marches GTB 2 x 10 reps Seated Hip Abd/ADD with GTB 2 x 10 reps  STS with GTB 2 x 10  STS on Blue foam and GTB 2 x 10 reps  Staggered STS on Blue foam 2 x 10 reps Walking with blue ball level 3 in hand x 60 secs Alternating 6" step-ups with BUE support x 10 BLE; Sit to stand 2 x 10 with no UE support  Side stepping with BTB around knees x multiple laps;  Seated clams with manual resistance from therapist x 10; Seated adductor squeeze with manual resistance from therapist x 10; Tandem balance alternating forward LE; Tandem gait in // bars x multiple lengths;   PATIENT EDUCATION:  Education details: Outcome measures/goals, pt educated throughout session about proper posture and technique with exercises. Improved exercise technique, movement at target joints, use of target muscles after min to mod verbal, visual, tactile cues.  Person educated: Patient Education method: Explanation Education comprehension: verbalized understanding   HOME EXERCISE PROGRAM:  Access Code: [email or web address] URL: [email or web address] Date: 06/29/2023 Prepared by: Crawford Dock  Exercises - Sit to Stand Without Arm Support  - 1 x daily - 7 x weekly - 3 sets - 10 reps - Heel  Raises with Counter Support  - 1 x daily - 7 x weekly - 3 sets - 10 reps - 3s hold - Standing Romberg to 1/2 Tandem Stance  - 1 x daily - 7 x weekly - 4 reps - 60s hold   ASSESSMENT:  CLINICAL IMPRESSION:    Updated outcome measures during session today. Her 5TSTS, 20m gait speed, and ABC all  improved compared to the initial evaluation. Unfortunately her BERG remained unchanged. Additional time during session focused on BLE strengthening. No HEP modifications on this date. She will benefit from PT services to address deficits in weakness, balance, and mobility in order to return to full function at home as well as decrease risk for falls.   OBJECTIVE IMPAIRMENTS: decreased activity tolerance, decreased balance, decreased coordination, and postural dysfunction.   ACTIVITY LIMITATIONS: carrying, sitting, standing, squatting, stairs, and transfers  PARTICIPATION LIMITATIONS: community activity and occupation  PERSONAL FACTORS: Age, Fitness, Past/current experiences, Time since onset of injury/illness/exacerbation, and 3+ comorbidities: lumbar and cervical radiculopathy, polyneuropathy, DDD, osteoporosis, OA, spondylosis, hx of lumbar fusion, chronic pain syndrome are also affecting patient's functional outcome.   REHAB POTENTIAL: Fair    CLINICAL DECISION MAKING: Unstable/unpredictable  EVALUATION COMPLEXITY: High   GOALS: Goals reviewed with patient? No  SHORT TERM GOALS: Target date: 07/08/23  Pt will be independent with HEP in order to improve strength and balance in order to decrease fall risk and improve function at home. Baseline:  Goal status: INITIAL   LONG TERM GOALS: Target date: 11/12/2023   1.  Pt will improve BERG by at least 3 points in order to demonstrate clinically significant improvement in balance.   Baseline: 06/29/23: 46/56; 09/17/23: 46/56 Goal status: UNCHANGED  2.  Pt will improve ABC by at least 13% in order to demonstrate clinically significant improvement in  balance confidence.      Baseline: 06/29/23: 56.9%; 09/17/23: 77.5%  Goal status: ACHIEVED  3. Pt will decrease 5TSTS by at least 3 seconds in order to demonstrate clinically significant improvement in LE strength      Baseline: 19.9s; 09/17/23: 12.0s; Goal status: ACHIEVED  4. Pt will increase self-selected by at least 0.13 m/s in order to demonstrate clinically significant improvement in community ambulation.      Baseline: Self-selected: 14.47s = 0.69 m/s; 09/17/23: Self-selected: 12.56s = 0.80 m/s;  Goal status: PARTIALLY;   PLAN: PT FREQUENCY: 2x/week  PT DURATION: 8 weeks  PLANNED INTERVENTIONS: Therapeutic exercises, Therapeutic activity, Neuromuscular re-education, Balance training, Gait training, Patient/Family education, Self Care, Joint mobilization, Joint manipulation, Vestibular training, Canalith repositioning, Orthotic/Fit training, DME instructions, Dry Needling, Electrical stimulation, Spinal manipulation, Spinal mobilization, Cryotherapy, Moist heat, Taping, Traction, Ultrasound, Ionotophoresis 4mg /ml Dexamethasone , Manual therapy, and Re-evaluation.  PLAN FOR NEXT SESSION: progress strength and balance exercises, reviewe/modify HEP as necessary.  Candi Chafe PT, DPT, GCS  Physical Therapist - [place]  [facility] 12:54 PM,09/18/23

## 2023-10-19 ENCOUNTER — Telehealth: Payer: Self-pay | Admitting: Student in an Organized Health Care Education/Training Program

## 2023-10-19 ENCOUNTER — Telehealth: Payer: Self-pay | Admitting: Nurse Practitioner

## 2023-10-19 ENCOUNTER — Other Ambulatory Visit: Payer: Self-pay | Admitting: Nurse Practitioner

## 2023-10-19 NOTE — Telephone Encounter (Signed)
 Telephone call from patient. She states that he pharmacy on has 60 tablets of her medication on hand., LEVODROMORAN . She states the pharmacist told her he could fill the 60 of 90 pills, but it would void the script. Patient asking if she has the 60 pills filled, can the provider write a new order for 30 pills to complete the script. Patient does state that she is out of medication. Patient has called other pharmacies and is encountering the same problem.

## 2023-10-19 NOTE — Telephone Encounter (Signed)
 Patient's regular pharmacy does not have meds to fill her script from 10-12-23. Says she has been out and feels like she is going thru withdrawals. I explain she needs to call different pharmacy to see if they can fill. She only called [facility] and they wont tell her yes or no. I told her to call other pharmacy until she gets one to fill then call us  back.

## 2023-10-20 ENCOUNTER — Other Ambulatory Visit: Payer: Self-pay | Admitting: Nurse Practitioner

## 2023-10-21 NOTE — Telephone Encounter (Signed)
 I spoke with [facility], they dispensed 68 tablets (Levorphanol)  to her on 10-20-23. They are likely not to have them in stock in the near future. I asked patient to call around to find out who may have them in stock, she told me she has called several pharmacies an no one has them.  She is asking to have medication changed to something else.

## 2023-10-21 NOTE — Telephone Encounter (Signed)
 Patient agrees to come for appt.

## 2023-11-03 ENCOUNTER — Encounter: Payer: Self-pay | Admitting: Nurse Practitioner

## 2023-11-03 ENCOUNTER — Ambulatory Visit: Attending: Nurse Practitioner | Admitting: Nurse Practitioner

## 2023-11-03 VITALS — BP 127/68 | HR 61 | Temp 97.4°F | Ht 60.0 in | Wt 204.0 lb

## 2023-11-03 DIAGNOSIS — M4807 Spinal stenosis, lumbosacral region: Secondary | ICD-10-CM | POA: Diagnosis not present

## 2023-11-03 DIAGNOSIS — Z981 Arthrodesis status: Secondary | ICD-10-CM | POA: Insufficient documentation

## 2023-11-03 DIAGNOSIS — G894 Chronic pain syndrome: Secondary | ICD-10-CM | POA: Diagnosis not present

## 2023-11-03 DIAGNOSIS — M5416 Radiculopathy, lumbar region: Secondary | ICD-10-CM | POA: Insufficient documentation

## 2023-11-03 DIAGNOSIS — Z79899 Other long term (current) drug therapy: Secondary | ICD-10-CM | POA: Insufficient documentation

## 2023-11-03 DIAGNOSIS — Z9689 Presence of other specified functional implants: Secondary | ICD-10-CM | POA: Insufficient documentation

## 2023-11-03 MED ORDER — OXYCODONE-ACETAMINOPHEN 5-325 MG PO TABS: 1.0000 | ORAL_TABLET | Freq: Three times a day (TID) | ORAL | 0 refills | Status: DC | PRN

## 2023-11-03 NOTE — Progress Notes (Signed)
 Nursing Pain Medication Assessment:  Safety precautions to be maintained throughout the outpatient stay will include: orient to surroundings, keep bed in low position, maintain call bell within reach at all times, provide assistance with transfer out of bed and ambulation.  Medication Inspection Compliance: Pill count conducted under aseptic conditions, in front of the patient. Neither the pills nor the bottle was removed from the patient's sight at any time. Once count was completed pills were immediately returned to the patient in their original bottle.  Medication: Levorphanol Pill/Patch Count: 11 of 68 pills/patches remain Pill/Patch Appearance: Markings consistent with prescribed medication Bottle Appearance: Standard pharmacy container. Clearly labeled. Filled Date: 06 / 19 / 2025 Last Medication intake:  Today

## 2023-11-03 NOTE — Progress Notes (Signed)
 PROVIDER NOTE: Interpretation of information contained herein should be left to medically-trained personnel. Specific patient instructions are provided elsewhere under Patient Instructions section of medical record. This document was created in part using AI and STT-dictation technology, any transcriptional errors that may result from this process are unintentional.  Patient: Connie [name]  Service: E/M   PCP: Cyrus Selinda Moose, PA-C  DOB: 1956/03/19  DOS: 11/03/2023  Provider: Emmy MARLA Blanch, NP  MRN: [id number]  Delivery: Face-to-face  Specialty: Interventional Pain Management  Type: Established Patient  Setting: Ambulatory outpatient facility  Specialty designation: 09  Referring Prov.: Cyrus Selinda Moose,*  Location: Outpatient office facility       History of present illness (HPI) [name], a 68 y.o. year old female, is here today because of her Lumbar radiculopathy [M54.16]. [name] primary complain today is Back Pain (Middle of back, Radiates down left leg and in to left foot. )   Pain Assessment: Severity of Chronic pain is reported as a 4 /10. Location: Back Mid, Lower/Radiates down left leg and foot. Onset: More than a month ago. Quality: Stabbing, Throbbing. Timing: Constant. Modifying factor(s): Rest, Medication,. Vitals:  height is 5' (1.524 m) and weight is 204 lb (92.5 kg). Her temperature is 97.4 F (36.3 C) (abnormal). Her blood pressure is 127/68 and her pulse is 61. Her oxygen saturation is 95%.  BMI: Estimated body mass index is 39.84 kg/m as calculated from the following:   Height as of this encounter: 5' (1.524 m).   Weight as of this encounter: 204 lb (92.5 kg).  Last encounter: 08/13/2023 Last procedure: 03/16/2023  Reason for encounter: medication management. No change in medical history since last visit.  Patient's pain is at baseline.  Patient continues multimodal pain regimen as prescribed.  States that it provides pain relief and  improvement in functional status.   The patient is currently prescribed Levorphanol 2 Mg every 8 hours; however due to the pharmacy backorder, she has been unable to obtain the medication.  I discussed alternative options with her, and we agreed to transition to Percocet 5-325 mg every 8 hours as needed for pain management.  The patient is agree with the plan until pharmacy have her Levorphanol.   Pharmacotherapy Assessment   Percocet 5-325 mg tablet every 8 hours as needed for pain.  Monitoring:  PMP: PDMP reviewed during this encounter.       Pharmacotherapy: No side-effects or adverse reactions reported. Compliance: No problems identified. Effectiveness: Clinically acceptable.  Erlene Doyal SAUN[place], [place]  11/03/2023  2:23 PM  Sign when Signing Visit Nursing Pain Medication Assessment:  Safety precautions to be maintained throughout the outpatient stay will include: orient to surroundings, keep bed in low position, maintain call bell within reach at all times, provide assistance with transfer out of bed and ambulation.  Medication Inspection Compliance: Pill count conducted under aseptic conditions, in front of the patient. Neither the pills nor the bottle was removed from the patient's sight at any time. Once count was completed pills were immediately returned to the patient in their original bottle.  Medication: Levorphanol Pill/Patch Count: 11 of 68 pills/patches remain Pill/Patch Appearance: Markings consistent with prescribed medication Bottle Appearance: Standard pharmacy container. Clearly labeled. Filled Date: 06 / 19 / 2025 Last Medication intake:  Today    UDS:  Summary  Date Value Ref Range Status  08/13/2023 FINAL  Final    Comment:    ==================================================================== [employer] Select 13 (MW) ==================================================================== Test  Result       Flag       Units    NO DRUGS  DETECTED. ==================================================================== Test                      Result    Flag   Units      Ref Range   Creatinine              233              mg/dL      >=79 ==================================================================== Declared Medications:  The flagging and interpretation on this report are based on the  following declared medications.  Unexpected results may arise from  inaccuracies in the declared medications.   **Note: The testing scope of this panel does not include the  following reported medications:   Azelastine (Astelin)  Bupropion  (Wellbutrin  XL)  Celecoxib  (Celebrex )  Cyanocobalamin  Fluticasone (Flonase)  Furosemide (Lasix)  Levorphanol  Levothyroxine (Synthroid)  Metoprolol (Toprol)  Omeprazole (Prilosec)  Pregabalin (Lyrica)  Quetiapine  (Seroquel )  Trazodone  (Desyrel ) ==================================================================== For clinical consultation, please call [phone]. ====================================================================     No results found for: CBDTHCR No results found for: D8THCCBX No results found for: D9THCCBX  ROS  Constitutional: Denies any fever or chills Gastrointestinal: No reported hemesis, hematochezia, vomiting, or acute GI distress Musculoskeletal: Low back pain Neurological: No reported episodes of acute onset apraxia, aphasia, dysarthria, agnosia, amnesia, paralysis, loss of coordination, or loss of consciousness  Medication Review  Cyanocobalamin, QUEtiapine , azelastine, buPROPion , celecoxib , fluticasone, furosemide, levorphanol, levothyroxine, metoprolol succinate, omeprazole, oxyCODONE -acetaminophen , pregabalin, tirzepatide, and traZODone   History Review  Allergy: [name] has no known allergies. Drug: [name]  reports no history of drug use. Alcohol:  reports no history of alcohol use. Tobacco:  reports that she quit smoking about 16  years ago. Her smoking use included cigarettes. She started smoking about 36 years ago. She has a 10 pack-year smoking history. She has never used smokeless tobacco. Social: Connie [name]  reports that she quit smoking about 16 years ago. Her smoking use included cigarettes. She started smoking about 36 years ago. She has a 10 pack-year smoking history. She has never used smokeless tobacco. She reports that she does not drink alcohol and does not use drugs. Medical:  has a past medical history of Anemia, Anxiety, Arthritis, Cervical spondylosis with myelopathy (2018), Chronic pain (2019), DDD (degenerative disc disease), lumbar, Depression, Displacement of cervical intervertebral disc (2018), Dyspnea, Fibromyalgia, GERD (gastroesophageal reflux disease), Heart murmur, Hypertension, Hypothyroidism, Lumbar post-laminectomy syndrome (2018), Lumbosacral radiculitis (2018), Neuropathy due to medical condition (HCC), Osteoarthritis, Osteoporosis, Other long term (current) drug therapy (2019), Pain in the coccyx (2018), Sleep apnea, Spondylolisthesis (2018), Spondylosis of lumbosacral region without myelopathy or radiculopathy (2018), Spondylosis with myelopathy, lumbar region (2018), and Vitamin D deficiency. Surgical: Connie [name]  has a past surgical history that includes Cesarean section; Gastric bypass (2004); Lumbar fusion (2016); Spinal cord stimulator implant (2018); Cholecystectomy; Appendectomy; Tonsillectomy; Shoulder arthroscopy with rotator cuff repair (Right, 2008); Joint replacement (Bilateral, 2005); Abdominal hysterectomy (2000); epidural injection (2018); arm surgery (2002); Breast biopsy (Left, 2005); Colonoscopy with propofol  (N/A, 08/25/2017); Temporal artery biopsy / ligation; Cataract extraction w/PHACO (Right, 10/21/2017); and Colonoscopy with propofol  (N/A, 02/25/2022). Family: family history includes Anxiety disorder in her mother; Breast cancer in her cousin, maternal aunt, and mother; Cancer in her  brother, father, and mother; Depression in her mother; Hypertension in her father; Kidney disease in her mother.  Laboratory Chemistry Profile  Renal Lab Results  Component Value Date   BUN 18 06/23/2017   CREATININE 0.60 06/23/2017   GFRAA >60 06/23/2017   GFRNONAA >60 06/23/2017    Hepatic Lab Results  Component Value Date   AST 20 06/23/2017   ALT 9 (L) 06/23/2017   ALBUMIN 3.6 06/23/2017   ALKPHOS 88 06/23/2017    Electrolytes Lab Results  Component Value Date   NA 135 06/23/2017   K 4.9 06/23/2017   CL 102 06/23/2017   CALCIUM 8.5 (L) 06/23/2017    Bone No results found for: VD25OH, VD125OH2TOT, CI6874NY7, CI7874NY7, 25OHVITD1, 25OHVITD2, 25OHVITD3, TESTOFREE, TESTOSTERONE  Inflammation (CRP: Acute Phase) (ESR: Chronic Phase) No results found for: CRP, ESRSEDRATE, LATICACIDVEN       Note: Above Lab results reviewed.  Recent Imaging Review  NM Myocar Multi W/Spect W/Wall Motion / EF   The study is normal. The study is low risk.   No ST deviation was noted.   LV perfusion is normal. There is no evidence of ischemia. There is no  evidence of infarction.   Left ventricular function is normal. Nuclear stress EF: 72%. The left  ventricular ejection fraction is hyperdynamic (>65%). End diastolic cavity  size is normal.  Conclusion Normal pharmacologic myocardial perfusion scan No evidence of stress-induced myocardial ischemia Ejection fraction of 72% with normal wall motion This is a low risk study Note: Reviewed        Physical Exam  Vitals: BP 127/68   Pulse 61   Temp (!) 97.4 F (36.3 C)   Ht 5' (1.524 m)   Wt 204 lb (92.5 kg)   SpO2 95%   BMI 39.84 kg/m  BMI: Estimated body mass index is 39.84 kg/m as calculated from the following:   Height as of this encounter: 5' (1.524 m).   Weight as of this encounter: 204 lb (92.5 kg). Ideal: Ideal body weight: 45.5 kg (100 lb 4.9 oz) Adjusted ideal body weight: 64.3 kg (141 lb 12.6  oz) General appearance: Well nourished, well developed, and well hydrated. In no apparent acute distress Mental status: Alert, oriented x 3 (person, place, & time)       Respiratory: No evidence of acute respiratory distress Eyes: PERLA   Assessment   Diagnosis Status  1. Lumbar radiculopathy   2. Spinal stenosis of lumbosacral region   3. History of lumbar fusion   4. Chronic pain syndrome   5. Spinal cord stimulator status   6. Medication management    Controlled Controlled Controlled   Updated Problems: No problems updated.  Plan of Care  Problem-specific:  Assessment and Plan  Started Percocet 5-325 mg tablet every 8 hours as needed for pain management.   The patient is currently prescribed Levorphanol 2 Mg every 8 hours; however due to the pharmacy backorder, she has been unable to obtain the medication.  I discussed alternative options with her, and we agreed to transition to Percocet 5-325 mg every 8 hours as needed for pain management.  Prescribing drug monitoring (PDMP) reviewed; findings consistent with the use of prescribed medication and no evidence of narcotic misuse or abuse.  Schedule follow-up in 30 days for evaluation of med management.    [name] has a current medication list which includes the following long-term medication(s): bupropion , levothyroxine, metoprolol succinate, omeprazole, pregabalin, quetiapine , and trazodone .  Pharmacotherapy (Medications Ordered): Meds ordered this encounter  Medications   DISCONTD: oxyCODONE -acetaminophen  (PERCOCET) 5-325 MG tablet    Sig: Take 1 tablet by mouth every  8 (eight) hours as needed for severe pain (pain score 7-10). Must last 30 days.    Dispense:  90 tablet    Refill:  0    Must last 30 days.   oxyCODONE -acetaminophen  (PERCOCET) 5-325 MG tablet    Sig: Take 1 tablet by mouth every 8 (eight) hours as needed for severe pain (pain score 7-10). Must last 30 days. Patient got only 68 pills  levophanol instead 90 (Full complete) and she does not get from any pharmacy. Having a short duration of medication she will get earlier medication on 11/06/2023 with new prescription.    Dispense:  90 tablet    Refill:  0    Must last 30 days   Orders:  No orders of the defined types were placed in this encounter.       Return in about 1 month (around 12/04/2023) for (F2F), (MM), Emmy Blanch NP.    Recent Visits Date Type Provider Dept  08/13/23 Office Visit Marcelino Nurse, MD [facility]  Showing recent visits within past 90 days and meeting all other requirements Today's Visits Date Type Provider Dept  11/03/23 Office Visit Kaedan Richert K, NP [facility]  Showing today's visits and meeting all other requirements Future Appointments Date Type Provider Dept  12/02/23 Appointment Shykeria Sakamoto K, NP [facility]  Showing future appointments within next 90 days and meeting all other requirements  I discussed the assessment and treatment plan with the patient. The patient was provided an opportunity to ask questions and all were answered. The patient agreed with the plan and demonstrated an understanding of the instructions.  Patient advised to call back or seek an in-person evaluation if the symptoms or condition worsens.  Duration of encounter: 30 minutes.  Total time on encounter, as per AMA guidelines included both the face-to-face and non-face-to-face time personally spent by the physician and/or other qualified health care professional(s) on the day of the encounter (includes time in activities that require the physician or other qualified health care professional and does not include time in activities normally performed by clinical staff). Physician's time may include the following activities when performed: Preparing to see the patient (e.g., pre-charting review of records, searching for previously ordered imaging, lab work, and nerve conduction  tests) Review of prior analgesic pharmacotherapies. Reviewing PMP Interpreting ordered tests (e.g., lab work, imaging, nerve conduction tests) Performing post-procedure evaluations, including interpretation of diagnostic procedures Obtaining and/or reviewing separately obtained history Performing a medically appropriate examination and/or evaluation Counseling and educating the patient/family/caregiver Ordering medications, tests, or procedures Referring and communicating with other health care professionals (when not separately reported) Documenting clinical information in the electronic or other health record Independently interpreting results (not separately reported) and communicating results to the patient/ family/caregiver Care coordination (not separately reported)  Note by: Makaylynn Bonillas K Ajani Rineer, NP (TTS and AI technology used. I apologize for any typographical errors that were not detected and corrected.) Date: 11/03/2023; Time: 4:08 PM

## 2023-11-30 ENCOUNTER — Ambulatory Visit: Attending: Nurse Practitioner | Admitting: Nurse Practitioner

## 2023-11-30 ENCOUNTER — Encounter: Payer: Self-pay | Admitting: Nurse Practitioner

## 2023-11-30 VITALS — BP 102/62 | HR 62 | Temp 97.5°F | Ht 60.0 in | Wt 197.0 lb

## 2023-11-30 DIAGNOSIS — M5416 Radiculopathy, lumbar region: Secondary | ICD-10-CM | POA: Diagnosis not present

## 2023-11-30 DIAGNOSIS — Z981 Arthrodesis status: Secondary | ICD-10-CM | POA: Insufficient documentation

## 2023-11-30 DIAGNOSIS — M5430 Sciatica, unspecified side: Secondary | ICD-10-CM | POA: Diagnosis present

## 2023-11-30 DIAGNOSIS — M5136 Other intervertebral disc degeneration, lumbar region with discogenic back pain only: Secondary | ICD-10-CM | POA: Insufficient documentation

## 2023-11-30 DIAGNOSIS — Z9689 Presence of other specified functional implants: Secondary | ICD-10-CM | POA: Insufficient documentation

## 2023-11-30 DIAGNOSIS — Z79899 Other long term (current) drug therapy: Secondary | ICD-10-CM | POA: Insufficient documentation

## 2023-11-30 DIAGNOSIS — M4807 Spinal stenosis, lumbosacral region: Secondary | ICD-10-CM | POA: Diagnosis present

## 2023-11-30 DIAGNOSIS — G894 Chronic pain syndrome: Secondary | ICD-10-CM | POA: Insufficient documentation

## 2023-11-30 MED ORDER — TIZANIDINE HCL 4 MG PO CAPS: 4.0000 mg | ORAL_CAPSULE | Freq: Every day | ORAL | 0 refills | Status: AC

## 2023-11-30 MED ORDER — OXYCODONE HCL 10 MG PO TABS: 10.0000 mg | ORAL_TABLET | Freq: Three times a day (TID) | ORAL | 0 refills | Status: DC | PRN

## 2023-11-30 NOTE — Progress Notes (Signed)
 Nursing Pain Medication Assessment:  Safety precautions to be maintained throughout the outpatient stay will include: orient to surroundings, keep bed in low position, maintain call bell within reach at all times, provide assistance with transfer out of bed and ambulation.  Medication Inspection Compliance: Pill count conducted under aseptic conditions, in front of the patient. Neither the pills nor the bottle was removed from the patient's sight at any time. Once count was completed pills were immediately returned to the patient in their original bottle.  Medication: Oxycodone /APAP Pill/Patch Count: 19 of 90 pills/patches remain Pill/Patch Appearance: Markings consistent with prescribed medication Bottle Appearance: Standard pharmacy container. Clearly labeled. Filled Date: 7 / 2 / 2025 Last Medication intake:  TodaySafety precautions to be maintained throughout the outpatient stay will include: orient to surroundings, keep bed in low position, maintain call bell within reach at all times, provide assistance with transfer out of bed and ambulation.

## 2023-11-30 NOTE — Progress Notes (Signed)
 PROVIDER NOTE: Interpretation of information contained herein should be left to medically-trained personnel. Specific patient instructions are provided elsewhere under Patient Instructions section of medical record. This document was created in part using AI and STT-dictation technology, any transcriptional errors that may result from this process are unintentional.  Patient: Connie [name]  Service: E/M   PCP: Cyrus Selinda Moose, PA-C  DOB: 1955-07-16  DOS: 11/30/2023  Provider: Emmy MARLA Blanch, NP  MRN: [id number]  Delivery: Face-to-face  Specialty: Interventional Pain Management  Type: Established Patient  Setting: Ambulatory outpatient facility  Specialty designation: 09  Referring Prov.: Cyrus Selinda Moose,*  Location: Outpatient office facility       History of present illness (HPI) Ms. Shandie Bertz, a 68 y.o. year old female, is here today because of her Chronic pain syndrome [G89.4]. Ms. Holster primary complain today is Back Pain (Lower, mid,)  Pertinent problems: Ms. Nelligan has Degenerative joint disease of cervical and lumbar spine; Lumbar spondylosis; History of lumbar fusion; Lumbar degenerative disc disease; Fibromyalgia; Spinal cord stimulator status; Generalized osteoarthritis of multiple sites; Lumbar postlaminectomy syndrome; Spinal stenosis of lumbar region without neurogenic claudication; and Chronic pain syndrome on their pertinent problem list. Pain Assessment: Severity of Chronic pain is reported as a 8 /10. Location: Back Lower, Mid, Upper (neck 4)/pain radiaties down left leg to her foot. Onset: More than a month ago. Quality: Aching, Throbbing. Timing: Constant. Modifying factor(s): Rest and meds. Vitals:  height is 5' (1.524 m) and weight is 197 lb (89.4 kg). Her temperature is 97.5 F (36.4 C) (abnormal). Her blood pressure is 102/62 and her pulse is 62. Her oxygen saturation is 98%.  BMI: Estimated body mass index is 38.47 kg/m as calculated from the  following:   Height as of this encounter: 5' (1.524 m).   Weight as of this encounter: 197 lb (89.4 kg).  Last encounter: 11/03/2023. Last procedure: Visit date not found.  Reason for encounter: medication management. No change in medical history since last visit.  Patient's pain is at baseline.  Patient continues multimodal pain regimen as prescribed.  States that it provides pain relief and improvement in functional status.   The patient is currently prescribed Levorphanol 2 Mg every 8 hours; however due to the pharmacy backorder, she has been unable to obtain the medication.  I discussed alternative options with her, and we agreed to transition to Percocet 5-325 mg every 8 hours as needed for pain management; however she complains that Percocet wears off quickly and she has issue with upset stomach due to tylenol . We discuss to start Oxycodone  HCl 10 mg three times daily for up to 30 days.  Pharmacotherapy Assessment   Oxycodone  HCl 10 mg 3 times daily as needed for pain for up to 30 days. (MME with Percocet = 22.50) Tizanidine  (Zanaflex ) 4 mg tablet for muscle spasm Monitoring: [place] PMP: PDMP reviewed during this encounter.       Pharmacotherapy: No side-effects or adverse reactions reported. Compliance: No problems identified. Effectiveness: Clinically acceptable.  Delores Dorothe LABOR, RN  11/30/2023 11:51 AM  Sign when Signing Visit Nursing Pain Medication Assessment:  Safety precautions to be maintained throughout the outpatient stay will include: orient to surroundings, keep bed in low position, maintain call bell within reach at all times, provide assistance with transfer out of bed and ambulation.  Medication Inspection Compliance: Pill count conducted under aseptic conditions, in front of the patient. Neither the pills nor the bottle was removed from the patient's sight at  any time. Once count was completed pills were immediately returned to the patient in their original bottle.  Medication:  Oxycodone /APAP Pill/Patch Count: 19 of 90 pills/patches remain Pill/Patch Appearance: Markings consistent with prescribed medication Bottle Appearance: Standard pharmacy container. Clearly labeled. Filled Date: 7 / 57 / 2025 Last Medication intake:  TodaySafety precautions to be maintained throughout the outpatient stay will include: orient to surroundings, keep bed in low position, maintain call bell within reach at all times, provide assistance with transfer out of bed and ambulation.     UDS:  Summary  Date Value Ref Range Status  08/13/2023 FINAL  Final    Comment:    ==================================================================== [employer] Select 13 (MW) ==================================================================== Test                             Result       Flag       Units    NO DRUGS DETECTED. ==================================================================== Test                      Result    Flag   Units      Ref Range   Creatinine              233              mg/dL      >=79 ==================================================================== Declared Medications:  The flagging and interpretation on this report are based on the  following declared medications.  Unexpected results may arise from  inaccuracies in the declared medications.   **Note: The testing scope of this panel does not include the  following reported medications:   Azelastine (Astelin)  Bupropion  (Wellbutrin  XL)  Celecoxib  (Celebrex )  Cyanocobalamin  Fluticasone (Flonase)  Furosemide (Lasix)  Levorphanol  Levothyroxine (Synthroid)  Metoprolol (Toprol)  Omeprazole (Prilosec)  Pregabalin (Lyrica)  Quetiapine  (Seroquel )  Trazodone  (Desyrel ) ==================================================================== For clinical consultation, please call [phone]. ====================================================================     No results found for: CBDTHCR No  results found for: D8THCCBX No results found for: D9THCCBX  ROS  Constitutional: Denies any fever or chills Gastrointestinal: No reported hemesis, hematochezia, vomiting, or acute GI distress Musculoskeletal: Lower and mid back pain Neurological: No reported episodes of acute onset apraxia, aphasia, dysarthria, agnosia, amnesia, paralysis, loss of coordination, or loss of consciousness  Medication Review  Cyanocobalamin, Oxycodone  HCl, QUEtiapine , azelastine, buPROPion , celecoxib , cephALEXin, fluticasone, furosemide, levothyroxine, metoprolol succinate, omeprazole, phenazopyridine, pregabalin, tiZANidine , tirzepatide, and traZODone   History Review  Allergy: Ms. Cliff has no known allergies. Drug: Ms. Younker  reports no history of drug use. Alcohol:  reports no history of alcohol use. Tobacco:  reports that she quit smoking about 16 years ago. Her smoking use included cigarettes. She started smoking about 36 years ago. She has a 10 pack-year smoking history. She has never used smokeless tobacco. Social: Ms. Crookshanks  reports that she quit smoking about 16 years ago. Her smoking use included cigarettes. She started smoking about 36 years ago. She has a 10 pack-year smoking history. She has never used smokeless tobacco. She reports that she does not drink alcohol and does not use drugs. Medical:  has a past medical history of Anemia, Anxiety, Arthritis, Cervical spondylosis with myelopathy (2018), Chronic pain (2019), DDD (degenerative disc disease), lumbar, Depression, Displacement of cervical intervertebral disc (2018), Dyspnea, Fibromyalgia, GERD (gastroesophageal reflux disease), Heart murmur, Hypertension, Hypothyroidism, Lumbar post-laminectomy syndrome (2018), Lumbosacral radiculitis (2018), Neuropathy due to medical condition (HCC),  Osteoarthritis, Osteoporosis, Other long term (current) drug therapy (2019), Pain in the coccyx (2018), Sleep apnea, Spondylolisthesis (2018), Spondylosis of  lumbosacral region without myelopathy or radiculopathy (2018), Spondylosis with myelopathy, lumbar region (2018), and Vitamin D deficiency. Surgical: Ms. Liggett  has a past surgical history that includes Cesarean section; Gastric bypass (2004); Lumbar fusion (2016); Spinal cord stimulator implant (2018); Cholecystectomy; Appendectomy; Tonsillectomy; Shoulder arthroscopy with rotator cuff repair (Right, 2008); Joint replacement (Bilateral, 2005); Abdominal hysterectomy (2000); epidural injection (2018); arm surgery (2002); Breast biopsy (Left, 2005); Colonoscopy with propofol  (N/A, 08/25/2017); Temporal artery biopsy / ligation; Cataract extraction w/PHACO (Right, 10/21/2017); and Colonoscopy with propofol  (N/A, 02/25/2022). Family: family history includes Anxiety disorder in her mother; Breast cancer in her cousin, maternal aunt, and mother; Cancer in her brother, father, and mother; Depression in her mother; Hypertension in her father; Kidney disease in her mother.  Laboratory Chemistry Profile   Renal Lab Results  Component Value Date   BUN 18 06/23/2017   CREATININE 0.60 06/23/2017   GFRAA >60 06/23/2017   GFRNONAA >60 06/23/2017    Hepatic Lab Results  Component Value Date   AST 20 06/23/2017   ALT 9 (L) 06/23/2017   ALBUMIN 3.6 06/23/2017   ALKPHOS 88 06/23/2017    Electrolytes Lab Results  Component Value Date   NA 135 06/23/2017   K 4.9 06/23/2017   CL 102 06/23/2017   CALCIUM 8.5 (L) 06/23/2017    Bone No results found for: VD25OH, VD125OH2TOT, CI6874NY7, CI7874NY7, 25OHVITD1, 25OHVITD2, 25OHVITD3, TESTOFREE, TESTOSTERONE  Inflammation (CRP: Acute Phase) (ESR: Chronic Phase) No results found for: CRP, ESRSEDRATE, LATICACIDVEN       Note: Above Lab results reviewed.  Recent Imaging Review  NM Myocar Multi W/Spect W/Wall Motion / EF   The study is normal. The study is low risk.   No ST deviation was noted.   LV perfusion is normal. There is no  evidence of ischemia. There is no  evidence of infarction.   Left ventricular function is normal. Nuclear stress EF: 72%. The left  ventricular ejection fraction is hyperdynamic (>65%). End diastolic cavity  size is normal.  Conclusion Normal pharmacologic myocardial perfusion scan No evidence of stress-induced myocardial ischemia Ejection fraction of 72% with normal wall motion This is a low risk study Note: Reviewed        Physical Exam  Vitals: BP 102/62   Pulse 62   Temp (!) 97.5 F (36.4 C)   Ht 5' (1.524 m)   Wt 197 lb (89.4 kg)   SpO2 98%   BMI 38.47 kg/m  BMI: Estimated body mass index is 38.47 kg/m as calculated from the following:   Height as of this encounter: 5' (1.524 m).   Weight as of this encounter: 197 lb (89.4 kg). Ideal: Ideal body weight: 45.5 kg (100 lb 4.9 oz) Adjusted ideal body weight: 63 kg (138 lb 15.8 oz) General appearance: Well nourished, well developed, and well hydrated. In no apparent acute distress Mental status: Alert, oriented x 3 (person, place, & time)       Respiratory: No evidence of acute respiratory distress Eyes: PERLA   Assessment   Diagnosis Status  1. Chronic pain syndrome   2. Lumbar radiculopathy   3. Spinal stenosis of lumbosacral region   4. History of lumbar fusion   5. Spinal cord stimulator status   6. Medication management   7. Sciatic leg pain   8. Degeneration of intervertebral disc of lumbar region with discogenic back pain  Controlled Controlled Controlled   Updated Problems: No problems updated.  Plan of Care  Problem-specific:  Assessment and Plan Discontinue Percocet 5-325 mg tablet Started oxycodone  HCl 10 mg 3 times daily as needed for pain for up to 30 days. Scribe and drug monitoring (PDMP) reviewed; findings consistent with the use of prescribed medication and no evidence of narcotic misuse or abuse.  UDS up-to-date.  Schedule follow-up in 30 days for medication management.  Ms. Bryn Saline  Hayden has a current medication list which includes the following long-term medication(s): bupropion , levothyroxine, metoprolol succinate, omeprazole, pregabalin, quetiapine , and trazodone .  Pharmacotherapy (Medications Ordered): Meds ordered this encounter  Medications   Oxycodone  HCl 10 MG TABS    Sig: Take 1 tablet (10 mg total) by mouth every 8 (eight) hours as needed. Must last 30 days.    Dispense:  90 tablet    Refill:  0    Chronic Pain: STOP Act (Not applicable) Fill 1 day early if closed on refill date. Avoid benzodiazepines within 8 hours of opioids   tiZANidine  (ZANAFLEX ) 4 MG capsule    Sig: Take 1 capsule (4 mg total) by mouth daily. Do not drive while taking this medication.    Dispense:  30 capsule    Refill:  0   Orders:  No orders of the defined types were placed in this encounter.       Return in about 1 month (around 12/31/2023) for (F2F), (MM), Emmy Blanch NP.    Recent Visits Date Type Provider Dept  11/03/23 Office Visit Jahson Emanuele K, NP [facility]  Showing recent visits within past 90 days and meeting all other requirements Today's Visits Date Type Provider Dept  11/30/23 Office Visit Azaiah Licciardi K, NP [facility]  Showing today's visits and meeting all other requirements Future Appointments Date Type Provider Dept  12/29/23 Appointment Gema Ringold K, NP [facility]  Showing future appointments within next 90 days and meeting all other requirements  I discussed the assessment and treatment plan with the patient. The patient was provided an opportunity to ask questions and all were answered. The patient agreed with the plan and demonstrated an understanding of the instructions.  Patient advised to call back or seek an in-person evaluation if the symptoms or condition worsens.  Duration of encounter: 30 minutes.  Total time on encounter, as per AMA guidelines included both the face-to-face and non-face-to-face time  personally spent by the physician and/or other qualified health care professional(s) on the day of the encounter (includes time in activities that require the physician or other qualified health care professional and does not include time in activities normally performed by clinical staff). Physician's time may include the following activities when performed: Preparing to see the patient (e.g., pre-charting review of records, searching for previously ordered imaging, lab work, and nerve conduction tests) Review of prior analgesic pharmacotherapies. Reviewing PMP Interpreting ordered tests (e.g., lab work, imaging, nerve conduction tests) Performing post-procedure evaluations, including interpretation of diagnostic procedures Obtaining and/or reviewing separately obtained history Performing a medically appropriate examination and/or evaluation Counseling and educating the patient/family/caregiver Ordering medications, tests, or procedures Referring and communicating with other health care professionals (when not separately reported) Documenting clinical information in the electronic or other health record Independently interpreting results (not separately reported) and communicating results to the patient/ family/caregiver Care coordination (not separately reported)  Note by: Evalie Hargraves K Mairyn Lenahan, NP (TTS and AI technology used. I apologize for any typographical errors that were not  detected and corrected.) Date: 11/30/2023; Time: 12:43 PM

## 2023-12-02 ENCOUNTER — Encounter: Admitting: Nurse Practitioner

## 2023-12-07 ENCOUNTER — Telehealth: Admitting: Psychiatry

## 2023-12-07 ENCOUNTER — Encounter: Payer: Self-pay | Admitting: Psychiatry

## 2023-12-07 DIAGNOSIS — G4700 Insomnia, unspecified: Secondary | ICD-10-CM

## 2023-12-07 DIAGNOSIS — F3342 Major depressive disorder, recurrent, in full remission: Secondary | ICD-10-CM | POA: Diagnosis not present

## 2023-12-07 DIAGNOSIS — G4701 Insomnia due to medical condition: Secondary | ICD-10-CM

## 2023-12-07 DIAGNOSIS — F411 Generalized anxiety disorder: Secondary | ICD-10-CM | POA: Diagnosis not present

## 2023-12-07 NOTE — Progress Notes (Signed)
 Virtual Visit via Video Note  I connected with Connie [name] on 12/07/23 at  1:00 PM EDT by a video enabled telemedicine application and verified that I am speaking with the correct person using two identifiers.  Location Provider Location : [facility] Patient Location : Home  Participants: Patient , Provider    I discussed the limitations of evaluation and management by telemedicine and the availability of in person appointments. The patient expressed understanding and agreed to proceed.    I discussed the assessment and treatment plan with the patient. The patient was provided an opportunity to ask questions and all were answered. The patient agreed with the plan and demonstrated an understanding of the instructions.   The patient was advised to call back or seek an in-person evaluation if the symptoms worsen or if the condition fails to improve as anticipated.   BH MD OP Progress Note  12/07/2023 1:17 PM Connie [name]  MRN:  [id number]  Chief Complaint:  Chief Complaint  Patient presents with   Follow-up   Anxiety   Depression   Medication Refill   Discussed the use of AI scribe software for clinical note transcription with the patient, who gave verbal consent to proceed.  History of Present Illness Connie [name] is a 68 year old Caucasian female, married, lives in [place], has a history of MDD, GAD, brief psychotic disorder, insomnia, obstructive sleep apnea currently not on CPAP, fibromyalgia, hearing loss, hypothyroidism, polyarthralgia was evaluated by telemedicine today.  She reports that her mood has been affected by significant psychosocial stressors, primarily related to her husband's serious illness and her role as his primary caregiver. Describing the situation as rough, she states that her husband requires extensive assistance with daily activities, which has increased her stress and situational anxiety.  She continues to experience ongoing sleep disturbance,  and she states that she has not been sleeping well. She reports that unmanaged pain, which wakes her up at night, primarily disrupts her sleep. She notes that her pain management doctor recently switched her from levorphanol, which she could no longer obtain, to a low dose of oxycodone , but she feels this has not helped her pain. She also reports taking trazodone  100 mg nightly, but she states that trazodone  has not helped her sleep. After previously attempting to reduce the frequency to every other day, she has resumed taking Seroquel  nightly and reports that Seroquel  helps her sleep better. She spaces out the timing of Seroquel  and oxycodone  to avoid taking them together.  She denies any thoughts about hurting herself or others.  She expresses interest in obtaining professional therapy, but she has encountered barriers to accessing a therapist through the clinic.  She lives with her spouse and serves as primary caregiver.      Visit Diagnosis:    ICD-10-CM   1. Recurrent major depressive disorder, in full remission (HCC)  F33.42     2. GAD (generalized anxiety disorder)  F41.1     3. Insomnia due to medical condition  G47.01    Pain, restless leg symptoms, sleep apnea not on CPAP      Past Psychiatric History: I have reviewed past psychiatric history from progress note on 08/06/2017.  Past trials of Latuda, Wellbutrin , Cymbalta .  Past Medical History:  Past Medical History:  Diagnosis Date   Anemia    in the past   Anxiety    Arthritis    all over   Cervical spondylosis with myelopathy 2018   Chronic pain 2019  can't stand straight, uses walker for stability   DDD (degenerative disc disease), lumbar    Depression    Displacement of cervical intervertebral disc 2018   Dyspnea    HANDICAPPED   Fibromyalgia    Polymyalgia Rheumatica   GERD (gastroesophageal reflux disease)    Heart murmur    not treated or being followed   Hypertension    CONTROLLED   Hypothyroidism     Lumbar post-laminectomy syndrome 2018   Lumbosacral radiculitis 2018   Neuropathy due to medical condition (HCC)    feet, toes, fingers   Osteoarthritis    Osteoporosis    Other long term (current) drug therapy 2019   from pain management   Pain in the coccyx 2018   disorder of sacrum   Sleep apnea    Spondylolisthesis 2018   Spondylosis of lumbosacral region without myelopathy or radiculopathy 2018   Spondylosis with myelopathy, lumbar region 2018   Vitamin D deficiency     Past Surgical History:  Procedure Laterality Date   ABDOMINAL HYSTERECTOMY  2000   APPENDECTOMY     arm surgery  2002   BREAST BIOPSY Left 2005   benign   CATARACT EXTRACTION W/PHACO Right 10/21/2017   Procedure: CATARACT EXTRACTION PHACO AND INTRAOCULAR LENS PLACEMENT (IOC) RIGHT;  Surgeon: Mittie Gaskin, MD;  [facility];  Service: Ophthalmology;  Laterality: Right;  neck issues   CESAREAN SECTION     x 2   CHOLECYSTECTOMY     COLONOSCOPY WITH PROPOFOL  N/A 08/25/2017   Procedure: COLONOSCOPY WITH PROPOFOL ;  Surgeon: Toledo, Ladell POUR, MD;  Location: [facility];  Service: Gastroenterology;  Laterality: N/A;   COLONOSCOPY WITH PROPOFOL  N/A 02/25/2022   Procedure: COLONOSCOPY WITH PROPOFOL ;  Surgeon: Maryruth Ole DASEN, MD;  Location: [facility];  Service: Endoscopy;  Laterality: N/A;   epidural injection  2018   steroids   GASTRIC BYPASS  2004   JOINT REPLACEMENT Bilateral 2005   knees   LUMBAR FUSION  2016   x 2/ L3-5   SHOULDER ARTHROSCOPY WITH ROTATOR CUFF REPAIR Right 2008   x 3   SPINAL CORD STIMULATOR IMPLANT  2018   [employer]   TEMPORAL ARTERY BIOPSY / LIGATION     TONSILLECTOMY      Family Psychiatric History: I reviewed family psychiatric history from progress note on 08/06/2017.  Family History:  Family History  Problem Relation Age of Onset   Kidney disease Mother    Cancer Mother    Anxiety disorder Mother    Depression Mother    Breast  cancer Mother    Cancer Father    Hypertension Father    Cancer Brother    Breast cancer Maternal Aunt    Breast cancer Cousin     Social History: I have reviewed social history from progress note on 08/06/2017. Social History   Socioeconomic History   Marital status: Married    Spouse name: brady    Number of children: 2   Years of education: Not on file   Highest education level: High school graduate  Occupational History    Comment: disabled   Tobacco Use   Smoking status: Former    Current packs/day: 0.00    Average packs/day: 0.5 packs/day for 20.0 years (10.0 ttl pk-yrs)    Types: Cigarettes    Start date: 05/21/1987    Quit date: 05/21/2007    Years since quitting: 16.5   Smokeless tobacco: Never  Vaping Use   Vaping status:  Never Used  Substance and Sexual Activity   Alcohol use: No   Drug use: No    Comment: managed by pain clinic   Sexual activity: Not Currently  Other Topics Concern   Not on file  Social History Narrative   Not on file   Social Drivers of Health   Financial Resource Strain: Low Risk  (11/23/2023)   Received from [facility]   Overall Financial Resource Strain (CARDIA)    Difficulty of Paying Living Expenses: Not very hard  Food Insecurity: No Food Insecurity (11/23/2023)   [facility] from [facility]   Hunger Vital Sign    Within the past 12 months, you worried that your food would run out before you got the money to buy more.: Never true    Within the past 12 months, the food you bought just didn't last and you didn't have money to get more.: Never true  Transportation Needs: No Transportation Needs (11/23/2023)   [facility] - Transportation    In the past 12 months, has lack of transportation kept you from medical appointments or from getting medications?: No    Lack of Transportation (Non-Medical): No  Physical Activity: Inactive (05/20/2017)   Exercise Vital  Sign    Days of Exercise per Week: 0 days    Minutes of Exercise per Session: 0 min  Stress: Not on file  Social Connections: Moderately Integrated (05/20/2017)   Social Connection and Isolation Panel    Frequency of Communication with Friends and Family: Three times a week    Frequency of Social Gatherings with Friends and Family: Once a week    Attends Religious Services: More than 4 times per year    Active Member of [employer] or Organizations: No    Attends Engineer, structural: Never    Marital Status: Married    Allergies: No Known Allergies  Metabolic Disorder Labs: No results found for: HGBA1C, MPG No results found for: PROLACTIN No results found for: CHOL, TRIG, HDL, CHOLHDL, VLDL, LDLCALC No results found for: TSH  Therapeutic Level Labs: No results found for: LITHIUM No results found for: VALPROATE No results found for: CBMZ  Current Medications: Current Outpatient Medications  Medication Sig Dispense Refill   azelastine (ASTELIN) 0.1 % nasal spray Place into the nose.     buPROPion  (WELLBUTRIN  XL) 150 MG 24 hr tablet Take 1 tablet (150 mg total) by mouth daily. 90 tablet 3   celecoxib  (CELEBREX ) 100 MG capsule Take by mouth.     cephALEXin (KEFLEX) 500 MG capsule Take 500 mg by mouth 3 (three) times daily.     Cyanocobalamin (VITAMIN B-12 IJ) Inject as directed. Once a month     fluticasone (FLONASE) 50 MCG/ACT nasal spray Place into the nose.     furosemide (LASIX) 40 MG tablet Take by mouth.     levothyroxine (SYNTHROID) 50 MCG tablet      metoprolol succinate (TOPROL-XL) 100 MG 24 hr tablet Take 100 mg by mouth once daily in the evening  0   omeprazole (PRILOSEC) 20 MG capsule Take 20 mg by mouth daily.     Oxycodone  HCl 10 MG TABS Take 1 tablet (10 mg total) by mouth every 8 (eight) hours as needed. Must last 30 days. 90 tablet 0   oxyCODONE -acetaminophen  (PERCOCET/ROXICET) 5-325 MG tablet See admin instructions.      phenazopyridine (PYRIDIUM) 95 MG tablet Take 95 mg by mouth 3 (three)  times daily as needed for pain.     pregabalin (LYRICA) 150 MG capsule Take 150 mg by mouth 3 (three) times daily.     QUEtiapine  (SEROQUEL ) 50 MG tablet TAKE 1 TABLET(50 MG) BY MOUTH AT BEDTIME AS NEEDED 90 tablet 1   tirzepatide (ZEPBOUND) 2.5 MG/0.5ML Pen Inject 2.5 mg into the skin once a week.     tiZANidine  (ZANAFLEX ) 4 MG capsule Take 1 capsule (4 mg total) by mouth daily. Do not drive while taking this medication. 30 capsule 0   traZODone  (DESYREL ) 50 MG tablet TAKE 1 TO 2 TABLETS(50 TO 100 MG) BY MOUTH AT BEDTIME AS NEEDED FOR SLEEP 180 tablet 2   No current facility-administered medications for this visit.     Musculoskeletal: Strength & Muscle Tone: UTA Gait & Station: Seated Patient leans: N/A  Psychiatric Specialty Exam: Review of Systems  Psychiatric/Behavioral:  Positive for sleep disturbance. The patient is nervous/anxious.     There were no vitals taken for this visit.There is no height or weight on file to calculate BMI.  General Appearance: Casual  Eye Contact:  Fair  Speech:  Clear and Coherent  Volume:  Normal  Mood:  Anxious  Affect:  Appropriate  Thought Process:  Goal Directed and Descriptions of Associations: Intact  Orientation:  Full (Time, Place, and Person)  Thought Content: Logical   Suicidal Thoughts:  No  Homicidal Thoughts:  No  Memory:  Immediate;   Fair Recent;   Fair Remote;   Fair  Judgement:  Fair  Insight:  Fair  Psychomotor Activity:  Normal  Concentration:  Concentration: Fair and Attention Span: Fair  Recall:  [employer] of Knowledge: Fair  Language: Fair  Akathisia:  No  Handed:  Right  AIMS (if indicated): not done  Assets:  Manufacturing systems engineer Desire for Improvement Housing Social Support Transportation  ADL's:  Intact  Cognition: WNL  Sleep:  Poor   Screenings: Geneticist, molecular Office Visit from 09/16/2022 in [facility] Office Visit from 06/17/2022 in [facility] Office Visit from 04/16/2022 in [facility] Office Visit from 02/12/2022 in [facility] Office Visit from 01/16/2022 in [facility]  AIMS Total Score 0 0 0 0 0   GAD-7    Flowsheet Row Office Visit from 09/08/2023 in [facility] Office Visit from 11/18/2022 in [facility] Office Visit from 09/16/2022 in [facility] Office Visit from 06/17/2022 in [facility] Office Visit from 04/16/2022 in [facility]  Total GAD-7 Score 3 0 0 0 2   PHQ2-9    Flowsheet Row Office Visit from 11/30/2023 in [facility] Visit from 11/03/2023 in [facility] Visit from 09/08/2023 in [facility] Office Visit from 08/13/2023 in [facility] Video Visit from 06/11/2023 in [facility]  PHQ-2 Total Score 2 0 2 0 0  PHQ-9 Total Score -- -- 5 -- --   Flowsheet Row Video Visit from 12/07/2023 in [facility] Office Visit from 09/08/2023 in [facility] Video Visit from 06/11/2023 in [facility]  Psychiatric Associates  C-SSRS RISK CATEGORY No Risk No Risk No Risk     Assessment and Plan: Connie [name] is a 68 year old Caucasian female with history of MDD, GAD, chronic pain, hypothyroidism was evaluated by telemedicine today.  Discussed  assessment and plan as noted below.  Insomnia-unstable Continues to struggle with sleep problems, multifactorial including uncontrolled pain.  Was diagnosed with sleep apnea, noncompliant on CPAP. Patient encouraged to start using CPAP for sleep apnea Continue Trazodone  50-100 mg at bedtime as needed, could increase the dosage,currently declines. Continue Seroquel  50 mg at bedtime as needed, could use it more frequently if sleep unstable.  MDD in remission Currently denies any significant depression symptoms Continue Wellbutrin  XL 150 mg daily-reduced dosage Continue Seroquel  as needed as prescribed.  Generalized anxiety disorder-improving Currently does have situational anxiety and will benefit from CBT. I have referred patient for psychotherapy with Ms. Almarie Ligas. Communicated with staff.  History of brief psychotic disorder-resolved Will monitor closely.  Follow-up Follow-up in clinic in 2 months or sooner if needed.   Collaboration of Care: Collaboration of Care: Referral or follow-up with counselor/therapist AEB I have communicated with staff regarding scheduling this patient without in-house therapist for CBT.  Patient/Guardian was advised Release of Information must be obtained prior to any record release in order to collaborate their care with an outside provider. Patient/Guardian was advised if they have not already done so to contact the registration department to sign all necessary forms in order for us  to release information regarding their care.   Consent: Patient/Guardian gives verbal consent for treatment and assignment of benefits for services provided during this visit. Patient/Guardian expressed understanding and agreed to proceed.  This note was generated in part or whole with voice recognition software. Voice recognition is usually quite accurate but there are transcription errors that can and very often do occur. I apologize for any typographical errors that  were not detected and corrected.     Connie Lumb, MD 12/08/2023, 10:26 AM

## 2023-12-14 ENCOUNTER — Encounter: Payer: Self-pay | Admitting: Student in an Organized Health Care Education/Training Program

## 2023-12-29 ENCOUNTER — Encounter: Payer: Self-pay | Admitting: Nurse Practitioner

## 2023-12-29 ENCOUNTER — Ambulatory Visit: Attending: Nurse Practitioner | Admitting: Nurse Practitioner

## 2023-12-29 VITALS — BP 137/71 | HR 69 | Temp 97.0°F | Ht 60.0 in | Wt 197.0 lb

## 2023-12-29 DIAGNOSIS — M4807 Spinal stenosis, lumbosacral region: Secondary | ICD-10-CM | POA: Diagnosis not present

## 2023-12-29 DIAGNOSIS — M5416 Radiculopathy, lumbar region: Secondary | ICD-10-CM | POA: Insufficient documentation

## 2023-12-29 DIAGNOSIS — Z981 Arthrodesis status: Secondary | ICD-10-CM | POA: Insufficient documentation

## 2023-12-29 DIAGNOSIS — Z9689 Presence of other specified functional implants: Secondary | ICD-10-CM | POA: Diagnosis present

## 2023-12-29 DIAGNOSIS — M5136 Other intervertebral disc degeneration, lumbar region with discogenic back pain only: Secondary | ICD-10-CM | POA: Insufficient documentation

## 2023-12-29 DIAGNOSIS — G894 Chronic pain syndrome: Secondary | ICD-10-CM | POA: Insufficient documentation

## 2023-12-29 DIAGNOSIS — Z79899 Other long term (current) drug therapy: Secondary | ICD-10-CM | POA: Insufficient documentation

## 2023-12-29 DIAGNOSIS — M5430 Sciatica, unspecified side: Secondary | ICD-10-CM | POA: Diagnosis present

## 2023-12-29 MED ORDER — LEVORPHANOL TARTRATE 2 MG PO TABS: 2.0000 mg | ORAL_TABLET | Freq: Three times a day (TID) | ORAL | 0 refills | Status: DC | PRN

## 2023-12-29 NOTE — Progress Notes (Signed)
 PROVIDER NOTE: Interpretation of information contained herein should be left to medically-trained personnel. Specific patient instructions are provided elsewhere under Patient Instructions section of medical record. This document was created in part using AI and STT-dictation technology, any transcriptional errors that may result from this process are unintentional.  Patient: Connie [name]  Service: E/M   PCP: Cyrus Selinda Moose, PA-C  DOB: 1955-07-06  DOS: 12/29/2023  Provider: Emmy MARLA Blanch, NP  MRN: [id number]  Delivery: Face-to-face  Specialty: Interventional Pain Management  Type: Established Patient  Setting: Ambulatory outpatient facility  Specialty designation: 09  Referring Prov.: Cyrus Selinda Moose,*  Location: Outpatient office facility       History of present illness (HPI) [name], a 68 y.o. year old female, is here today because of her Chronic pain syndrome [G89.4]. [name] primary complain today is Back Pain (Lower back)  Pertinent problems: [name] has Degenerative joint disease of cervical and lumbar spine; Lumbar spondylosis; History of lumbar fusion; Lumbar degenerative disc disease; Fibromyalgia; Spinal cord stimulator status; Generalized osteoarthritis of multiple sites; Lumbar postlaminectomy syndrome; Spinal stenosis of lumbar region without neurogenic claudication; and Chronic pain syndrome on their pertinent problem list.   Pain Assessment: Severity of Chronic pain is reported as a 8 /10. Location: Back Right, Left, Upper, Mid, Lower/pain radiaties everywhere. Onset: More than a month ago. Quality: Aching, Burning, Constant, Throbbing, Stabbing, Shooting, Sharp. Timing: Constant. Modifying factor(s): meds and rest. Vitals:  height is 5' (1.524 m) and weight is 197 lb (89.4 kg). Her temperature is 97 F (36.1 C) (abnormal). Her blood pressure is 137/71 and her pulse is 69. Her oxygen saturation is 99%.  BMI: Estimated body mass index is 38.47  kg/m as calculated from the following:   Height as of this encounter: 5' (1.524 m).   Weight as of this encounter: 197 lb (89.4 kg).  Last encounter: 11/30/2023. Last procedure: Visit date not found.  Reason for encounter: medication management. No change in medical history since last visit.  Patient's pain is at baseline.  Patient continues multimodal pain regimen as prescribed.  States that it provides pain relief and improvement in functional status.   The patient is transitioning from Oxycodone  HCl 10 mg to Levorphanol 2 Mg every 8 hours. In previous month she was unable to get Levorphanol due to back order.   Pharmacotherapy Assessment   Oxycodone  HCl 10 mg 3 times daily as needed for pain for up to 30 days. (MME with Percocet = 22.50)  levorphanol (Levodromoran) 2 mg tab every 8 hours as needed for pain. MME=66 (Started on 12/29/2023) Monitoring: [place] PMP: PDMP reviewed during this encounter.       Pharmacotherapy: No side-effects or adverse reactions reported. Compliance: No problems identified. Effectiveness: Clinically acceptable.  Delores Dorothe LABOR, RN  12/29/2023  2:31 PM  Sign when Signing Visit Nursing Pain Medication Assessment:  Safety precautions to be maintained throughout the outpatient stay will include: orient to surroundings, keep bed in low position, maintain call bell within reach at all times, provide assistance with transfer out of bed and ambulation.  Medication Inspection Compliance: Pill count conducted under aseptic conditions, in front of the patient. Neither the pills nor the bottle was removed from the patient's sight at any time. Once count was completed pills were immediately returned to the patient in their original bottle.  Medication: Oxycodone  IR Pill/Patch Count: 18 of 90 pills/patches remain Pill/Patch Appearance: Markings consistent with prescribed medication Bottle Appearance: Standard pharmacy container. Clearly  labeled. Filled Date: 8 / 10 /  2025 Last Medication intake:  TodaySafety precautions to be maintained throughout the outpatient stay will include: orient to surroundings, keep bed in low position, maintain call bell within reach at all times, provide assistance with transfer out of bed and ambulation.     UDS:  Summary  Date Value Ref Range Status  08/13/2023 FINAL  Final    Comment:    ==================================================================== [employer] Select 13 (MW) ==================================================================== Test                             Result       Flag       Units    NO DRUGS DETECTED. ==================================================================== Test                      Result    Flag   Units      Ref Range   Creatinine              233              mg/dL      >=79 ==================================================================== Declared Medications:  The flagging and interpretation on this report are based on the  following declared medications.  Unexpected results may arise from  inaccuracies in the declared medications.   **Note: The testing scope of this panel does not include the  following reported medications:   Azelastine (Astelin)  Bupropion  (Wellbutrin  XL)  Celecoxib  (Celebrex )  Cyanocobalamin  Fluticasone (Flonase)  Furosemide (Lasix)  Levorphanol  Levothyroxine (Synthroid)  Metoprolol (Toprol)  Omeprazole (Prilosec)  Pregabalin (Lyrica)  Quetiapine  (Seroquel )  Trazodone  (Desyrel ) ==================================================================== For clinical consultation, please call [phone]. ====================================================================     No results found for: CBDTHCR No results found for: D8THCCBX No results found for: D9THCCBX  ROS  Constitutional: Denies any fever or chills Gastrointestinal: No reported hemesis, hematochezia, vomiting, or acute GI distress Musculoskeletal: Denies  any acute onset joint swelling, redness, loss of ROM, or weakness Neurological: No reported episodes of acute onset apraxia, aphasia, dysarthria, agnosia, amnesia, paralysis, loss of coordination, or loss of consciousness  Medication Review  Cyanocobalamin, Oxycodone  HCl, QUEtiapine , azelastine, buPROPion , celecoxib , cephALEXin, fluticasone, furosemide, levorphanol, levothyroxine, metoprolol succinate, omeprazole, oxyCODONE -acetaminophen , phenazopyridine, pregabalin, tiZANidine , tirzepatide, and traZODone   History Review  Allergy: Ms. Khiev has no known allergies. Drug: Ms. Philipp  reports no history of drug use. Alcohol:  reports no history of alcohol use. Tobacco:  reports that she quit smoking about 16 years ago. Her smoking use included cigarettes. She started smoking about 36 years ago. She has a 10 pack-year smoking history. She has never used smokeless tobacco. Social: Ms. Wisdom  reports that she quit smoking about 16 years ago. Her smoking use included cigarettes. She started smoking about 36 years ago. She has a 10 pack-year smoking history. She has never used smokeless tobacco. She reports that she does not drink alcohol and does not use drugs. Medical:  has a past medical history of Anemia, Anxiety, Arthritis, Cervical spondylosis with myelopathy (2018), Chronic pain (2019), DDD (degenerative disc disease), lumbar, Depression, Displacement of cervical intervertebral disc (2018), Dyspnea, Fibromyalgia, GERD (gastroesophageal reflux disease), Heart murmur, Hypertension, Hypothyroidism, Lumbar post-laminectomy syndrome (2018), Lumbosacral radiculitis (2018), Neuropathy due to medical condition (HCC), Osteoarthritis, Osteoporosis, Other long term (current) drug therapy (2019), Pain in the coccyx (2018), Sleep apnea, Spondylolisthesis (2018), Spondylosis of lumbosacral region without myelopathy or radiculopathy (2018), Spondylosis with myelopathy, lumbar  region (2018), and Vitamin D  deficiency. Surgical: Ms. Shepheard  has a past surgical history that includes Cesarean section; Gastric bypass (2004); Lumbar fusion (2016); Spinal cord stimulator implant (2018); Cholecystectomy; Appendectomy; Tonsillectomy; Shoulder arthroscopy with rotator cuff repair (Right, 2008); Joint replacement (Bilateral, 2005); Abdominal hysterectomy (2000); epidural injection (2018); arm surgery (2002); Breast biopsy (Left, 2005); Colonoscopy with propofol  (N/A, 08/25/2017); Temporal artery biopsy / ligation; Cataract extraction w/PHACO (Right, 10/21/2017); and Colonoscopy with propofol  (N/A, 02/25/2022). Family: family history includes Anxiety disorder in her mother; Breast cancer in her cousin, maternal aunt, and mother; Cancer in her brother, father, and mother; Depression in her mother; Hypertension in her father; Kidney disease in her mother.  Laboratory Chemistry Profile   Renal Lab Results  Component Value Date   BUN 18 06/23/2017   CREATININE 0.60 06/23/2017   GFRAA >60 06/23/2017   GFRNONAA >60 06/23/2017    Hepatic Lab Results  Component Value Date   AST 20 06/23/2017   ALT 9 (L) 06/23/2017   ALBUMIN 3.6 06/23/2017   ALKPHOS 88 06/23/2017    Electrolytes Lab Results  Component Value Date   NA 135 06/23/2017   K 4.9 06/23/2017   CL 102 06/23/2017   CALCIUM 8.5 (L) 06/23/2017    Bone No results found for: VD25OH, VD125OH2TOT, CI6874NY7, CI7874NY7, 25OHVITD1, 25OHVITD2, 25OHVITD3, TESTOFREE, TESTOSTERONE  Inflammation (CRP: Acute Phase) (ESR: Chronic Phase) No results found for: CRP, ESRSEDRATE, LATICACIDVEN       Note: Above Lab results reviewed.  Recent Imaging Review  NM Myocar Multi W/Spect W/Wall Motion / EF   The study is normal. The study is low risk.   No ST deviation was noted.   LV perfusion is normal. There is no evidence of ischemia. There is no  evidence of infarction.   Left ventricular function is normal. Nuclear stress EF: 72%. The left   ventricular ejection fraction is hyperdynamic (>65%). End diastolic cavity  size is normal.  Conclusion Normal pharmacologic myocardial perfusion scan No evidence of stress-induced myocardial ischemia Ejection fraction of 72% with normal wall motion This is a low risk study Note: Reviewed        Physical Exam  Vitals: BP 137/71   Pulse 69   Temp (!) 97 F (36.1 C)   Ht 5' (1.524 m)   Wt 197 lb (89.4 kg)   SpO2 99%   BMI 38.47 kg/m  BMI: Estimated body mass index is 38.47 kg/m as calculated from the following:   Height as of this encounter: 5' (1.524 m).   Weight as of this encounter: 197 lb (89.4 kg). Ideal: Ideal body weight: 45.5 kg (100 lb 4.9 oz) Adjusted ideal body weight: 63 kg (138 lb 15.8 oz) General appearance: Well nourished, well developed, and well hydrated. In no apparent acute distress Mental status: Alert, oriented x 3 (person, place, & time)       Respiratory: No evidence of acute respiratory distress Eyes: PERLA   Assessment   Diagnosis Status  1. Chronic pain syndrome   2. Lumbar radiculopathy   3. Spinal stenosis of lumbosacral region   4. History of lumbar fusion   5. Spinal cord stimulator status   6. Medication management   7. Sciatic leg pain   8. Degeneration of intervertebral disc of lumbar region with discogenic back pain    Controlled Controlled Controlled   Updated Problems: No problems updated.  Plan of Care  Problem-specific:  Assessment and Plan   Started on levorphanol (Levodromoran) 2 mg tab  every 8 hours as needed for pain.   Prescribing drug monitoring (PDMP) reviewed; findings consistent with the use of prescribed medication and no evidence of narcotic misuse or abuse.  Urine drug screening (UDS) up-to-date.  Schedule follow-up in 90 days for medication management.  Ms. Kinzlee Selvy Lantigua has a current medication list which includes the following long-term medication(s): bupropion , levothyroxine, metoprolol succinate,  omeprazole, pregabalin, quetiapine , and trazodone .  Pharmacotherapy (Medications Ordered): Meds ordered this encounter  Medications   levorphanol (LEVODROMORAN) 2 MG tablet    Sig: Take 1 tablet (2 mg total) by mouth every 8 (eight) hours as needed for pain.    Dispense:  90 tablet    Refill:  0    May fill one day early.   levorphanol (LEVODROMORAN) 2 MG tablet    Sig: Take 1 tablet (2 mg total) by mouth every 8 (eight) hours as needed for pain.    Dispense:  90 tablet    Refill:  0    May fill one day early.   levorphanol (LEVODROMORAN) 2 MG tablet    Sig: Take 1 tablet (2 mg total) by mouth every 8 (eight) hours as needed for pain.    Dispense:  90 tablet    Refill:  0    May fill one day early.   Orders:  No orders of the defined types were placed in this encounter.       Return in about 3 months (around 03/29/2024) for (F2F), (MM), Emmy Blanch NP.    Recent Visits Date Type Provider Dept  11/30/23 Office Visit Ayush Boulet K, NP [facility]  11/03/23 Office Visit Mirna Sutcliffe K, NP [facility]  Showing recent visits within past 90 days and meeting all other requirements Today's Visits Date Type Provider Dept  12/29/23 Office Visit Alvan Culpepper K, NP [facility]  Showing today's visits and meeting all other requirements Future Appointments Date Type Provider Dept  03/28/24 Appointment Daci Stubbe K, NP [facility]  Showing future appointments within next 90 days and meeting all other requirements  I discussed the assessment and treatment plan with the patient. The patient was provided an opportunity to ask questions and all were answered. The patient agreed with the plan and demonstrated an understanding of the instructions.  Patient advised to call back or seek an in-person evaluation if the symptoms or condition worsens.  Duration of encounter: 30 minutes.  Total time on encounter, as per AMA guidelines included both the  face-to-face and non-face-to-face time personally spent by the physician and/or other qualified health care professional(s) on the day of the encounter (includes time in activities that require the physician or other qualified health care professional and does not include time in activities normally performed by clinical staff). Physician's time may include the following activities when performed: Preparing to see the patient (e.g., pre-charting review of records, searching for previously ordered imaging, lab work, and nerve conduction tests) Review of prior analgesic pharmacotherapies. Reviewing PMP Interpreting ordered tests (e.g., lab work, imaging, nerve conduction tests) Performing post-procedure evaluations, including interpretation of diagnostic procedures Obtaining and/or reviewing separately obtained history Performing a medically appropriate examination and/or evaluation Counseling and educating the patient/family/caregiver Ordering medications, tests, or procedures Referring and communicating with other health care professionals (when not separately reported) Documenting clinical information in the electronic or other health record Independently interpreting results (not separately reported) and communicating results to the patient/ family/caregiver Care coordination (not separately reported)  Note by: Shaquoia Miers  MARLA Blanch, NP (TTS and AI technology used. I apologize for any typographical errors that were not detected and corrected.) Date: 12/29/2023; Time: 3:39 PM

## 2023-12-29 NOTE — Progress Notes (Deleted)
 PROVIDER NOTE: Interpretation of information contained herein should be left to medically-trained personnel. Specific patient instructions are provided elsewhere under Patient Instructions section of medical record. This document was created in part using AI and STT-dictation technology, any transcriptional errors that may result from this process are unintentional.  Patient: Connie [name]  Service: E/M Encounter  PCP: Cyrus Selinda Moose, PA-C  DOB: 1955-05-01  DOS: 12/29/2023  Provider: Emmy MARLA Blanch, NP  MRN: [id number]  Delivery: Face-to-face  Specialty: Interventional Pain Management  Type: Established Patient  Setting: Ambulatory outpatient facility  Specialty designation: 09  Referring Prov.: Cyrus Selinda Moose,*  Location: Outpatient office facility       Purpose:  The patient comes in today for {Blank single:19197::IM therapy,SCS analysis w/ or w/o programming,IT-Pump evaluation and possible adjustment,evaluation of operative/procedural site,evaluation of concerns about side/effect(s) or adverse reaction(s), *** }. Case was discussed with attending physician.  Subjective:  Connie [name] is a 68 y.o. year old, female patient, who comes today for a nurse visit complaining of No chief complaint on file. Her last contact with us  was on 11/30/2023. Severity of the pain is described as a  /10.   No notes on file  Objective:  Ms. Tranchina  vitals were not taken for this visit.  There is no height or weight on file to calculate BMI.  Analgesic:   Oxycodone  HCl 10 mg 3 times daily as needed for pain for up to 30 days. (MME with Percocet = 22.50) Tizanidine  (Zanaflex ) 4 mg tablet for muscle spasm Monitoring: [place] PMP: PDMP reviewed during this encounter.       Pharmacotherapy: No side-effects or adverse reactions reported. Compliance: No problems identified. Effectiveness: Clinically acceptable.  Delores Dorothe LABOR, RN  11/30/2023 11:51 AM  Sign when Signing  Visit Nursing Pain Medication Assessment:  Safety precautions to be maintained throughout the outpatient stay will include: orient to surroundings, keep bed in low position, maintain call bell within reach at all times, provide assistance with transfer out of bed and ambulation.  Medication Inspection Compliance: Pill count conducted under aseptic conditions, in front of the patient. Neither the pills nor the bottle was removed from the patient's sight at any time. Once count was completed pills were immediately returned to the patient in their original bottle.  Medication: Oxycodone /APAP Pill/Patch Count: 19 of 90 pills/patches remain Pill/Patch Appearance: Markings consistent with prescribed medication Bottle Appearance: Standard pharmacy container. Clearly labeled. Filled Date: 7 / 4 / 2025 Last Medication intake:  TodaySafety precautions to be maintained throughout the outpatient stay will include: orient to surroundings, keep bed in low position, maintain call bell within reach at all times, provide assistance with transfer out of bed and ambulation.     UDS:  Summary  Date Value Ref Range Status  08/13/2023 FINAL  Final    Comment:    ==================================================================== [employer] Select 13 (MW) ==================================================================== Test                             Result       Flag       Units    NO DRUGS DETECTED. ==================================================================== Test                      Result    Flag   Units      Ref Range   Creatinine  233              mg/dL      >=79 ==================================================================== Declared Medications:  The flagging and interpretation on this report are based on the  following declared medications.  Unexpected results may arise from  inaccuracies in the declared medications.   **Note: The testing scope of this panel does  not include the  following reported medications:   Azelastine (Astelin)  Bupropion  (Wellbutrin  XL)  Celecoxib  (Celebrex )  Cyanocobalamin  Fluticasone (Flonase)  Furosemide (Lasix)  Levorphanol  Levothyroxine (Synthroid)  Metoprolol (Toprol)  Omeprazole (Prilosec)  Pregabalin (Lyrica)  Quetiapine  (Seroquel )  Trazodone  (Desyrel ) ==================================================================== For clinical consultation, please call [phone]. ====================================================================     No results found for: CBDTHCR No results found for: D8THCCBX No results found for: D9THCCBX  ROS  Constitutional: Denies any fever or chills Gastrointestinal: No reported hemesis, hematochezia, vomiting, or acute GI distress Musculoskeletal: Lower and mid back pain Neurological: No reported episodes of acute onset apraxia, aphasia, dysarthria, agnosia, amnesia, paralysis, loss of coordination, or loss of consciousness  Medication Review  Cyanocobalamin, Oxycodone  HCl, QUEtiapine , azelastine, buPROPion , celecoxib , cephALEXin, fluticasone, furosemide, levothyroxine, metoprolol succinate, omeprazole, phenazopyridine, pregabalin, tiZANidine , tirzepatide, and traZODone   History Review  Allergy: Ms. Rutten has no known allergies. Drug: Ms. Marrone  reports no history of drug use. Alcohol:  reports no history of alcohol use. Tobacco:  reports that she quit smoking about 16 years ago. Her smoking use included cigarettes. She started smoking about 36 years ago. She has a 10 pack-year smoking history. She has never used smokeless tobacco. Social: Ms. Grumbine  reports that she quit smoking about 16 years ago. Her smoking use included cigarettes. She started smoking about 36 years ago. She has a 10 pack-year smoking history. She has never used smokeless tobacco. She reports that she does not drink alcohol and does not use drugs. Medical:  has a past medical history of  Anemia, Anxiety, Arthritis, Cervical spondylosis with myelopathy (2018), Chronic pain (2019), DDD (degenerative disc disease), lumbar, Depression, Displacement of cervical intervertebral disc (2018), Dyspnea, Fibromyalgia, GERD (gastroesophageal reflux disease), Heart murmur, Hypertension, Hypothyroidism, Lumbar post-laminectomy syndrome (2018), Lumbosacral radiculitis (2018), Neuropathy due to medical condition (HCC), Osteoarthritis, Osteoporosis, Other long term (current) drug therapy (2019), Pain in the coccyx (2018), Sleep apnea, Spondylolisthesis (2018), Spondylosis of lumbosacral region without myelopathy or radiculopathy (2018), Spondylosis with myelopathy, lumbar region (2018), and Vitamin D deficiency. Surgical: Ms. Dapper  has a past surgical history that includes Cesarean section; Gastric bypass (2004); Lumbar fusion (2016); Spinal cord stimulator implant (2018); Cholecystectomy; Appendectomy; Tonsillectomy; Shoulder arthroscopy with rotator cuff repair (Right, 2008); Joint replacement (Bilateral, 2005); Abdominal hysterectomy (2000); epidural injection (2018); arm surgery (2002); Breast biopsy (Left, 2005); Colonoscopy with propofol  (N/A, 08/25/2017); Temporal artery biopsy / ligation; Cataract extraction w/PHACO (Right, 10/21/2017); and Colonoscopy with propofol  (N/A, 02/25/2022). Family: family history includes Anxiety disorder in her mother; Breast cancer in her cousin, maternal aunt, and mother; Cancer in her brother, father, and mother; Depression in her mother; Hypertension in her father; Kidney disease in her mother.  Laboratory Chemistry Profile   Renal Lab Results  Component Value Date   BUN 18 06/23/2017   CREATININE 0.60 06/23/2017   GFRAA >60 06/23/2017   GFRNONAA >60 06/23/2017    Hepatic Lab Results  Component Value Date   AST 20 06/23/2017   ALT 9 (L) 06/23/2017   ALBUMIN 3.6 06/23/2017   ALKPHOS 88 06/23/2017    Electrolytes Lab Results  Component Value Date  NA 135  06/23/2017   K 4.9 06/23/2017   CL 102 06/23/2017   CALCIUM 8.5 (L) 06/23/2017    Bone No results found for: VD25OH, CI874NY7UNU, CI6874NY7, CI7874NY7, 25OHVITD1, 25OHVITD2, 25OHVITD3, TESTOFREE, TESTOSTERONE  Inflammation (CRP: Acute Phase) (ESR: Chronic Phase) No results found for: CRP, ESRSEDRATE, LATICACIDVEN       Note: Above Lab results reviewed.  Recent Imaging Review  NM Myocar Multi W/Spect W/Wall Motion / EF   The study is normal. The study is low risk.   No ST deviation was noted.   LV perfusion is normal. There is no evidence of ischemia. There is no  evidence of infarction.   Left ventricular function is normal. Nuclear stress EF: 72%. The left  ventricular ejection fraction is hyperdynamic (>65%). End diastolic cavity  size is normal.  Conclusion Normal pharmacologic myocardial perfusion scan No evidence of stress-induced myocardial ischemia Ejection fraction of 72% with normal wall motion This is a low risk study Note: Reviewed        Physical Exam  Vitals: BP 102/62   Pulse 62   Temp (!) 97.5 F (36.4 C)   Ht 5' (1.524 m)   Wt 197 lb (89.4 kg)   SpO2 98%   BMI 38.47 kg/m  BMI: Estimated body mass index is 38.47 kg/m as calculated from the following:   Height as of this encounter: 5' (1.524 m).   Weight as of this encounter: 197 lb (89.4 kg). Ideal: Ideal body weight: 45.5 kg (100 lb 4.9 oz) Adjusted ideal body weight: 63 kg (138 lb 15.8 oz) General appearance: Well nourished, well developed, and well hydrated. In no apparent acute distress Mental status: Alert, oriented x 3 (person, place, & time)       Respiratory: No evidence of acute respiratory distress Eyes: PERLA   Assessment   Diagnosis Status  1. Chronic pain syndrome   2. Lumbar radiculopathy   3. Spinal stenosis of lumbosacral region   4. History of lumbar fusion   5. Spinal cord stimulator status   6. Medication management   7. Sciatic leg pain   8.  Degeneration of intervertebral disc of lumbar region with discogenic back pain    Controlled Controlled Controlled   Updated Problems: No problems updated.  Plan of Care  Problem-specific:  Assessment and Plan Discontinue Percocet 5-325 mg tablet Started oxycodone  HCl 10 mg 3 times daily as needed for pain for up to 30 days. Scribe and drug monitoring (PDMP) reviewed; findings consistent with the use of prescribed medication and no evidence of narcotic misuse or abuse.  UDS up-to-date.  Schedule follow-up in 30 days for medication management.  Ms. Anayely Constantine Leitch has a current medication list which includes the following long-term medication(s): bupropion , levothyroxine, metoprolol succinate, omeprazole, pregabalin, quetiapine , and trazodone .  Pharmacotherapy (Medications Ordered): Meds ordered this encounter  Medications   Oxycodone  HCl 10 MG TABS    Sig: Take 1 tablet (10 mg total) by mouth every 8 (eight) hours as needed. Must last 30 days.    Dispense:  90 tablet    Refill:  0    Chronic Pain: STOP Act (Not applicable) Fill 1 day early if closed on refill date. Avoid benzodiazepines within 8 hours of opioids   tiZANidine  (ZANAFLEX ) 4 MG capsule    Sig: Take 1 capsule (4 mg total) by mouth daily. Do not drive while taking this medication.    Dispense:  30 capsule    Refill:  0   Orders:  No orders of the defined types were placed in this encounter.     Allergies:  Patient has no known allergies.  Labs:  Lab Results  Component Value Date   BUN 18 06/23/2017   CREATININE 0.60 06/23/2017   GFRAA >60 06/23/2017   GFRNONAA >60 06/23/2017    Assessment:  There were no encounter diagnoses.  Attestation: Medical screening examination/treatment/procedure(s) were performed by non-physician practitioner and as supervising physician I was immediately available for consultation/collaboration.  Plan of Care (POC)  Orders:  No orders of the defined types were placed in  this encounter.    Oxycodone  HCl 10 mg 3 times daily as needed for pain for up to 30 days. (MME with Percocet = 22.50) Tizanidine  (Zanaflex ) 4 mg tablet for muscle spasm Monitoring: [place] PMP: PDMP reviewed during this encounter.       Pharmacotherapy: No side-effects or adverse reactions reported. Compliance: No problems identified. Effectiveness: Clinically acceptable.  Delores Dorothe LABOR, RN  11/30/2023 11:51 AM  Sign when Signing Visit Nursing Pain Medication Assessment:  Safety precautions to be maintained throughout the outpatient stay will include: orient to surroundings, keep bed in low position, maintain call bell within reach at all times, provide assistance with transfer out of bed and ambulation.  Medication Inspection Compliance: Pill count conducted under aseptic conditions, in front of the patient. Neither the pills nor the bottle was removed from the patient's sight at any time. Once count was completed pills were immediately returned to the patient in their original bottle.  Medication: Oxycodone /APAP Pill/Patch Count: 19 of 90 pills/patches remain Pill/Patch Appearance: Markings consistent with prescribed medication Bottle Appearance: Standard pharmacy container. Clearly labeled. Filled Date: 7 / 39 / 2025 Last Medication intake:  TodaySafety precautions to be maintained throughout the outpatient stay will include: orient to surroundings, keep bed in low position, maintain call bell within reach at all times, provide assistance with transfer out of bed and ambulation.     UDS:  Summary  Date Value Ref Range Status  08/13/2023 FINAL  Final    Comment:    ==================================================================== [employer] Select 13 (MW) ==================================================================== Test                             Result       Flag       Units    NO DRUGS DETECTED. ==================================================================== Test                       Result    Flag   Units      Ref Range   Creatinine              233              mg/dL      >=79 ==================================================================== Declared Medications:  The flagging and interpretation on this report are based on the  following declared medications.  Unexpected results may arise from  inaccuracies in the declared medications.   **Note: The testing scope of this panel does not include the  following reported medications:   Azelastine (Astelin)  Bupropion  (Wellbutrin  XL)  Celecoxib  (Celebrex )  Cyanocobalamin  Fluticasone (Flonase)  Furosemide (Lasix)  Levorphanol  Levothyroxine (Synthroid)  Metoprolol (Toprol)  Omeprazole (Prilosec)  Pregabalin (Lyrica)  Quetiapine  (Seroquel )  Trazodone  (Desyrel ) ==================================================================== For clinical consultation, please call [phone]. ====================================================================     No results found for: CBDTHCR No results found for: D8THCCBX No  results found for: D9THCCBX  ROS  Constitutional: Denies any fever or chills Gastrointestinal: No reported hemesis, hematochezia, vomiting, or acute GI distress Musculoskeletal: Lower and mid back pain Neurological: No reported episodes of acute onset apraxia, aphasia, dysarthria, agnosia, amnesia, paralysis, loss of coordination, or loss of consciousness  Medication Review  Cyanocobalamin, Oxycodone  HCl, QUEtiapine , azelastine, buPROPion , celecoxib , cephALEXin, fluticasone, furosemide, levothyroxine, metoprolol succinate, omeprazole, phenazopyridine, pregabalin, tiZANidine , tirzepatide, and traZODone   History Review  Allergy: Ms. Mace has no known allergies. Drug: Ms. Charity  reports no history of drug use. Alcohol:  reports no history of alcohol use. Tobacco:  reports that she quit smoking about 16 years ago. Her smoking use included cigarettes. She started  smoking about 36 years ago. She has a 10 pack-year smoking history. She has never used smokeless tobacco. Social: Ms. Kirkey  reports that she quit smoking about 16 years ago. Her smoking use included cigarettes. She started smoking about 36 years ago. She has a 10 pack-year smoking history. She has never used smokeless tobacco. She reports that she does not drink alcohol and does not use drugs. Medical:  has a past medical history of Anemia, Anxiety, Arthritis, Cervical spondylosis with myelopathy (2018), Chronic pain (2019), DDD (degenerative disc disease), lumbar, Depression, Displacement of cervical intervertebral disc (2018), Dyspnea, Fibromyalgia, GERD (gastroesophageal reflux disease), Heart murmur, Hypertension, Hypothyroidism, Lumbar post-laminectomy syndrome (2018), Lumbosacral radiculitis (2018), Neuropathy due to medical condition (HCC), Osteoarthritis, Osteoporosis, Other long term (current) drug therapy (2019), Pain in the coccyx (2018), Sleep apnea, Spondylolisthesis (2018), Spondylosis of lumbosacral region without myelopathy or radiculopathy (2018), Spondylosis with myelopathy, lumbar region (2018), and Vitamin D deficiency. Surgical: Ms. Occhipinti  has a past surgical history that includes Cesarean section; Gastric bypass (2004); Lumbar fusion (2016); Spinal cord stimulator implant (2018); Cholecystectomy; Appendectomy; Tonsillectomy; Shoulder arthroscopy with rotator cuff repair (Right, 2008); Joint replacement (Bilateral, 2005); Abdominal hysterectomy (2000); epidural injection (2018); arm surgery (2002); Breast biopsy (Left, 2005); Colonoscopy with propofol  (N/A, 08/25/2017); Temporal artery biopsy / ligation; Cataract extraction w/PHACO (Right, 10/21/2017); and Colonoscopy with propofol  (N/A, 02/25/2022). Family: family history includes Anxiety disorder in her mother; Breast cancer in her cousin, maternal aunt, and mother; Cancer in her brother, father, and mother; Depression in her mother;  Hypertension in her father; Kidney disease in her mother.  Laboratory Chemistry Profile   Renal Lab Results  Component Value Date   BUN 18 06/23/2017   CREATININE 0.60 06/23/2017   GFRAA >60 06/23/2017   GFRNONAA >60 06/23/2017    Hepatic Lab Results  Component Value Date   AST 20 06/23/2017   ALT 9 (L) 06/23/2017   ALBUMIN 3.6 06/23/2017   ALKPHOS 88 06/23/2017    Electrolytes Lab Results  Component Value Date   NA 135 06/23/2017   K 4.9 06/23/2017   CL 102 06/23/2017   CALCIUM 8.5 (L) 06/23/2017    Bone No results found for: VD25OH, VD125OH2TOT, CI6874NY7, CI7874NY7, 25OHVITD1, 25OHVITD2, 25OHVITD3, TESTOFREE, TESTOSTERONE  Inflammation (CRP: Acute Phase) (ESR: Chronic Phase) No results found for: CRP, ESRSEDRATE, LATICACIDVEN       Note: Above Lab results reviewed.  Recent Imaging Review  NM Myocar Multi W/Spect W/Wall Motion / EF   The study is normal. The study is low risk.   No ST deviation was noted.   LV perfusion is normal. There is no evidence of ischemia. There is no  evidence of infarction.   Left ventricular function is normal. Nuclear stress EF: 72%. The left  ventricular ejection fraction is hyperdynamic (>  65%). End diastolic cavity  size is normal.  Conclusion Normal pharmacologic myocardial perfusion scan No evidence of stress-induced myocardial ischemia Ejection fraction of 72% with normal wall motion This is a low risk study Note: Reviewed        Physical Exam  Vitals: BP 102/62   Pulse 62   Temp (!) 97.5 F (36.4 C)   Ht 5' (1.524 m)   Wt 197 lb (89.4 kg)   SpO2 98%   BMI 38.47 kg/m  BMI: Estimated body mass index is 38.47 kg/m as calculated from the following:   Height as of this encounter: 5' (1.524 m).   Weight as of this encounter: 197 lb (89.4 kg). Ideal: Ideal body weight: 45.5 kg (100 lb 4.9 oz) Adjusted ideal body weight: 63 kg (138 lb 15.8 oz) General appearance: Well nourished, well developed, and  well hydrated. In no apparent acute distress Mental status: Alert, oriented x 3 (person, place, & time)       Respiratory: No evidence of acute respiratory distress Eyes: PERLA   Assessment   Diagnosis Status  1. Chronic pain syndrome   2. Lumbar radiculopathy   3. Spinal stenosis of lumbosacral region   4. History of lumbar fusion   5. Spinal cord stimulator status   6. Medication management   7. Sciatic leg pain   8. Degeneration of intervertebral disc of lumbar region with discogenic back pain    Controlled Controlled Controlled   Updated Problems: No problems updated.  Plan of Care  Problem-specific:  Assessment and Plan Discontinue Percocet 5-325 mg tablet Started oxycodone  HCl 10 mg 3 times daily as needed for pain for up to 30 days. Scribe and drug monitoring (PDMP) reviewed; findings consistent with the use of prescribed medication and no evidence of narcotic misuse or abuse.  UDS up-to-date.  Schedule follow-up in 30 days for medication management.  Connie [name] has a current medication list which includes the following long-term medication(s): bupropion , levothyroxine, metoprolol succinate, omeprazole, pregabalin, quetiapine , and trazodone .  Pharmacotherapy (Medications Ordered): Meds ordered this encounter  Medications   Oxycodone  HCl 10 MG TABS    Sig: Take 1 tablet (10 mg total) by mouth every 8 (eight) hours as needed. Must last 30 days.    Dispense:  90 tablet    Refill:  0    Chronic Pain: STOP Act (Not applicable) Fill 1 day early if closed on refill date. Avoid benzodiazepines within 8 hours of opioids   tiZANidine  (ZANAFLEX ) 4 MG capsule    Sig: Take 1 capsule (4 mg total) by mouth daily. Do not drive while taking this medication.    Dispense:  30 capsule    Refill:  0   Orders:  No orders of the defined types were placed in this encounter.     Medications ordered for procedure: No orders of the defined types were placed in this  encounter.  Medications administered: Connie EMERSON [place] [name] had no medications administered during this visit.  See the medical record for exact dosing, route, and time of administration.    {There is no content from the last Plan section.}    Follow-up plan:   No follow-ups on file.     Recent Visits Date Type Provider Dept  11/30/23 Office Visit [name] K, NP [facility]  11/03/23 Office Visit Doyt Castellana K, NP [facility]  Showing recent visits within past 90 days and meeting all other requirements Today's Visits Date Type Provider Dept  12/29/23 Appointment Tyberius Ryner K, NP [facility]  Showing today's visits and meeting all other requirements Future Appointments No visits were found meeting these conditions. Showing future appointments within next 90 days and meeting all other requirements   Disposition: {Blank single:19197::Transfer back to floor,Discharge home under the care of a responsible, capable, adult driver,Discharge home}  Discharge (Date  Time): 12/29/2023;   hrs.   Primary Care Physician: Cyrus Selinda Moose, PA-C Location: [facility] Note by: Kerry Odonohue K Camden Knotek, NP (TTS technology used. I apologize for any typographical errors that were not detected and corrected.) Date: 12/29/2023; Time: 2:26 PM  Disclaimer:  Medicine is not an Visual merchandiser. The only guarantee in medicine is that nothing is guaranteed. It is important to note that the decision to proceed with this intervention was based on the information collected from the patient. The Data and conclusions were drawn from the patient's questionnaire, the interview, and the physical examination. Because the information was provided in large part by the patient, it cannot be guaranteed that it has not been purposely or unconsciously manipulated. Every effort has been made to obtain as much relevant data as possible for this evaluation. It is  important to note that the conclusions that lead to this procedure are derived in large part from the available data. Always take into account that the treatment will also be dependent on availability of resources and existing treatment guidelines, considered by other Pain Management Practitioners as being common knowledge and practice, at the time of the intervention. For Medico-Legal purposes, it is also important to point out that variation in procedural techniques and pharmacological choices are the acceptable norm. The indications, contraindications, technique, and results of the above procedure should only be interpreted and judged by a Board-Certified Interventional Pain Specialist with extensive familiarity and expertise in the same exact procedure and technique.

## 2023-12-29 NOTE — Progress Notes (Signed)
 Nursing Pain Medication Assessment:  Safety precautions to be maintained throughout the outpatient stay will include: orient to surroundings, keep bed in low position, maintain call bell within reach at all times, provide assistance with transfer out of bed and ambulation.  Medication Inspection Compliance: Pill count conducted under aseptic conditions, in front of the patient. Neither the pills nor the bottle was removed from the patient's sight at any time. Once count was completed pills were immediately returned to the patient in their original bottle.  Medication: Oxycodone  IR Pill/Patch Count: 18 of 90 pills/patches remain Pill/Patch Appearance: Markings consistent with prescribed medication Bottle Appearance: Standard pharmacy container. Clearly labeled. Filled Date: 8 / 10 / 2025 Last Medication intake:  TodaySafety precautions to be maintained throughout the outpatient stay will include: orient to surroundings, keep bed in low position, maintain call bell within reach at all times, provide assistance with transfer out of bed and ambulation.

## 2023-12-31 ENCOUNTER — Encounter: Payer: Self-pay | Admitting: Nurse Practitioner

## 2024-01-01 NOTE — Telephone Encounter (Signed)
 Spoke back with patient to let her know that I have forwarded the medication issue to Seema and she will get that on Monday.  Patient does have enough of her current medication until then, we will touch base on Monday and see what we need to do to get patient some type of medication until Levorphanol is available.

## 2024-01-04 ENCOUNTER — Other Ambulatory Visit: Payer: Self-pay | Admitting: Nurse Practitioner

## 2024-01-04 DIAGNOSIS — G894 Chronic pain syndrome: Secondary | ICD-10-CM

## 2024-01-04 DIAGNOSIS — M4807 Spinal stenosis, lumbosacral region: Secondary | ICD-10-CM

## 2024-01-04 DIAGNOSIS — M5416 Radiculopathy, lumbar region: Secondary | ICD-10-CM

## 2024-01-04 DIAGNOSIS — Z79899 Other long term (current) drug therapy: Secondary | ICD-10-CM

## 2024-01-04 MED ORDER — OXYCODONE HCL 10 MG PO TABS: 10.0000 mg | ORAL_TABLET | Freq: Three times a day (TID) | ORAL | 0 refills | Status: DC | PRN

## 2024-01-07 ENCOUNTER — Other Ambulatory Visit: Payer: Self-pay | Admitting: Psychiatry

## 2024-01-07 DIAGNOSIS — F3342 Major depressive disorder, recurrent, in full remission: Secondary | ICD-10-CM

## 2024-01-29 ENCOUNTER — Encounter: Payer: Self-pay | Admitting: Nurse Practitioner

## 2024-02-02 ENCOUNTER — Telehealth: Payer: Self-pay | Admitting: Internal Medicine

## 2024-02-02 NOTE — Telephone Encounter (Signed)
 LVMTCB to schedule return office visit with Dr. Isaiah. May use nodule spot.

## 2024-02-04 ENCOUNTER — Ambulatory Visit: Attending: Nurse Practitioner | Admitting: Nurse Practitioner

## 2024-02-04 ENCOUNTER — Encounter: Payer: Self-pay | Admitting: Nurse Practitioner

## 2024-02-04 VITALS — BP 133/49 | HR 63 | Temp 97.2°F | Resp 18 | Ht 60.0 in | Wt 187.0 lb

## 2024-02-04 DIAGNOSIS — G894 Chronic pain syndrome: Secondary | ICD-10-CM | POA: Diagnosis not present

## 2024-02-04 DIAGNOSIS — M5430 Sciatica, unspecified side: Secondary | ICD-10-CM | POA: Insufficient documentation

## 2024-02-04 DIAGNOSIS — Z9689 Presence of other specified functional implants: Secondary | ICD-10-CM | POA: Insufficient documentation

## 2024-02-04 DIAGNOSIS — M4807 Spinal stenosis, lumbosacral region: Secondary | ICD-10-CM | POA: Insufficient documentation

## 2024-02-04 DIAGNOSIS — M5416 Radiculopathy, lumbar region: Secondary | ICD-10-CM | POA: Diagnosis not present

## 2024-02-04 DIAGNOSIS — Z79899 Other long term (current) drug therapy: Secondary | ICD-10-CM | POA: Insufficient documentation

## 2024-02-04 MED ORDER — LEVORPHANOL TARTRATE 2 MG PO TABS: 2.0000 mg | ORAL_TABLET | Freq: Three times a day (TID) | ORAL | 0 refills | Status: AC | PRN

## 2024-02-04 MED ORDER — LEVORPHANOL TARTRATE 2 MG PO TABS: 2.0000 mg | ORAL_TABLET | Freq: Three times a day (TID) | ORAL | 0 refills | Status: DC | PRN

## 2024-02-04 NOTE — Progress Notes (Signed)
 PROVIDER NOTE: Interpretation of information contained herein should be left to medically-trained personnel. Specific patient instructions are provided elsewhere under Patient Instructions section of medical record. This document was created in part using AI and STT-dictation technology, any transcriptional errors that may result from this process are unintentional.  Patient: Connie [name]  Service: E/M   PCP: Cyrus Selinda Moose, PA-C  DOB: 1955-11-22  DOS: 02/04/2024  Provider: Emmy MARLA Blanch, NP  MRN: [id number]  Delivery: Face-to-face  Specialty: Interventional Pain Management  Type: Established Patient  Setting: Ambulatory outpatient facility  Specialty designation: 09  Referring Prov.: Cyrus Selinda Moose,*  Location: Outpatient office facility       History of present illness (HPI) [name], a 68 y.o. year old female, is here today because of her Chronic pain syndrome [G89.4]. [name] primary complain today is Back Pain (Lower ), Neck Pain, and Leg Pain (Left)  Pertinent problems: [name] has  Degenerative joint disease of cervical and lumbar spine; Lumbar spondylosis; History of lumbar fusion; Lumbar degenerative disc disease; Fibromyalgia; Spinal cord stimulator status; Generalized osteoarthritis of multiple sites; Lumbar postlaminectomy syndrome; Spinal stenosis of lumbar region without neurogenic claudication; and Chronic pain syndrome on their pertinent problem list.  Pain Assessment: Severity of Chronic pain is reported as a 8 /10. Location: Back Medial/Back pain radiating into left leg,. Onset: More than a month ago. Quality: Burning, Aching, Contraction, Throbbing. Timing: Constant. Modifying factor(s): Meds, rest. Vitals:  height is 5' (1.524 m) and weight is 187 lb (84.8 kg). Her temporal temperature is 97.2 F (36.2 C) (abnormal). Her blood pressure is 133/49 (abnormal) and her pulse is 63. Her respiration is 18 and oxygen saturation is 99%.  BMI:  Estimated body mass index is 36.52 kg/m as calculated from the following:   Height as of this encounter: 5' (1.524 m).   Weight as of this encounter: 187 lb (84.8 kg).  Last encounter: 12/29/2023. Last procedure: Visit date not found.  Reason for encounter: medication management. No change in medical history since last visit.  Patient's pain is at baseline.  Patient continues multimodal pain regimen as prescribed.  States that it provides pain relief and improvement in functional status. The patient is transitioning from Oxycodone  HCl 10 mg to Levorphanol 2 Mg every 8 hours as the pharmacy has availability this month.  The patient reports a burning sensation at the site of her SCS (from [employer]) battery.  She also reports experiencing severe cramping in her feet and legs, radiating up to her upper legs.  I advised her to contact her Museum/gallery curator to evaluate the issue, as it may be related to the battery malfunction or charging this problems.  Alternatively, she can come to the office, and we will contact the representative here to address her concerns. Pharmacotherapy Assessment   levorphanol (Levodromoran) 2 mg tab every 8 hours as needed for pain. MME=66 (Started on 02/04/2024) Oxycodone  HCl 10 mg 3 times daily as needed for pain for up to 30 days. (MME with Percocet = 22.50 (Discontinue on 02/04/2024) Monitoring: [place] PMP: PDMP reviewed during this encounter.       Pharmacotherapy: No side-effects or adverse reactions reported. Compliance: No problems identified. Effectiveness: Clinically acceptable.  Erlene Doyal SAUN[place], [place]  02/04/2024 10:05 AM  Sign when Signing Visit Nursing Pain Medication Assessment:  Safety precautions to be maintained throughout the outpatient stay will include: orient to surroundings, keep bed in low position, maintain call bell within reach at all  times, provide assistance with transfer out of bed and ambulation.  Medication Inspection  Compliance: Pill count conducted under aseptic conditions, in front of the patient. Neither the pills nor the bottle was removed from the patient's sight at any time. Once count was completed pills were immediately returned to the patient in their original bottle.  Medication: Oxycodone  IR Pill/Patch Count: 0 of 90 pills/patches remain Pill/Patch Appearance: Markings consistent with prescribed medication Bottle Appearance: Standard pharmacy container. Clearly labeled. Filled Date: 09 / 09 / 2025 Last Medication intake:  Today    UDS:  Summary  Date Value Ref Range Status  08/13/2023 FINAL  Final    Comment:    ==================================================================== [employer] Select 13 (MW) ==================================================================== Test                             Result       Flag       Units    NO DRUGS DETECTED. ==================================================================== Test                      Result    Flag   Units      Ref Range   Creatinine              233              mg/dL      >=79 ==================================================================== Declared Medications:  The flagging and interpretation on this report are based on the  following declared medications.  Unexpected results may arise from  inaccuracies in the declared medications.   **Note: The testing scope of this panel does not include the  following reported medications:   Azelastine (Astelin)  Bupropion  (Wellbutrin  XL)  Celecoxib  (Celebrex )  Cyanocobalamin  Fluticasone (Flonase)  Furosemide (Lasix)  Levorphanol  Levothyroxine (Synthroid)  Metoprolol (Toprol)  Omeprazole (Prilosec)  Pregabalin (Lyrica)  Quetiapine  (Seroquel )  Trazodone  (Desyrel ) ==================================================================== For clinical consultation, please call [phone]. ====================================================================      No results found for: CBDTHCR No results found for: D8THCCBX No results found for: D9THCCBX  ROS  Constitutional: Denies any fever or chills Gastrointestinal: No reported hemesis, hematochezia, vomiting, or acute GI distress Musculoskeletal: Back Pain (Lower ), Neck Pain, and Leg Pain (Left) Neurological: No reported episodes of acute onset apraxia, aphasia, dysarthria, agnosia, amnesia, paralysis, loss of coordination, or loss of consciousness  Medication Review  Cyanocobalamin, QUEtiapine , azelastine, buPROPion , celecoxib , fluticasone, furosemide, levorphanol, levothyroxine, metoprolol succinate, omeprazole, pregabalin, tirzepatide, and traZODone   History Review  Allergy: [name] has no known allergies. Drug: [name]  reports no history of drug use. Alcohol:  reports no history of alcohol use. Tobacco:  reports that she quit smoking about 16 years ago. Her smoking use included cigarettes. She started smoking about 36 years ago. She has a 10 pack-year smoking history. She has never used smokeless tobacco. Social: [name]  reports that she quit smoking about 16 years ago. Her smoking use included cigarettes. She started smoking about 36 years ago. She has a 10 pack-year smoking history. She has never used smokeless tobacco. She reports that she does not drink alcohol and does not use drugs. Medical:  has a past medical history of Anemia, Anxiety, Arthritis, Cervical spondylosis with myelopathy (2018), Chronic pain (2019), DDD (degenerative disc disease), lumbar, Depression, Displacement of cervical intervertebral disc (2018), Dyspnea, Fibromyalgia, GERD (gastroesophageal reflux disease), Heart murmur, Hypertension, Hypothyroidism, Lumbar post-laminectomy syndrome (2018), Lumbosacral radiculitis (2018), Neuropathy due  to medical condition, Osteoarthritis, Osteoporosis, Other long term (current) drug therapy (2019), Pain in the coccyx (2018), Sleep apnea, Spondylolisthesis  (2018), Spondylosis of lumbosacral region without myelopathy or radiculopathy (2018), Spondylosis with myelopathy, lumbar region (2018), and Vitamin D deficiency. Surgical: [name]  has a past surgical history that includes Cesarean section; Gastric bypass (2004); Lumbar fusion (2016); Spinal cord stimulator implant (2018); Cholecystectomy; Appendectomy; Tonsillectomy; Shoulder arthroscopy with rotator cuff repair (Right, 2008); Joint replacement (Bilateral, 2005); Abdominal hysterectomy (2000); epidural injection (2018); arm surgery (2002); Breast biopsy (Left, 2005); Colonoscopy with propofol  (N/A, 08/25/2017); Temporal artery biopsy / ligation; Cataract extraction w/PHACO (Right, 10/21/2017); and Colonoscopy with propofol  (N/A, 02/25/2022). Family: family history includes Anxiety disorder in her mother; Breast cancer in her cousin, maternal aunt, and mother; Cancer in her brother, father, and mother; Depression in her mother; Hypertension in her father; Kidney disease in her mother.  Laboratory Chemistry Profile   Renal Lab Results  Component Value Date   BUN 18 06/23/2017   CREATININE 0.60 06/23/2017   GFRAA >60 06/23/2017   GFRNONAA >60 06/23/2017    Hepatic Lab Results  Component Value Date   AST 20 06/23/2017   ALT 9 (L) 06/23/2017   ALBUMIN 3.6 06/23/2017   ALKPHOS 88 06/23/2017    Electrolytes Lab Results  Component Value Date   NA 135 06/23/2017   K 4.9 06/23/2017   CL 102 06/23/2017   CALCIUM 8.5 (L) 06/23/2017    Bone No results found for: VD25OH, VD125OH2TOT, CI6874NY7, CI7874NY7, 25OHVITD1, 25OHVITD2, 25OHVITD3, TESTOFREE, TESTOSTERONE  Inflammation (CRP: Acute Phase) (ESR: Chronic Phase) No results found for: CRP, ESRSEDRATE, LATICACIDVEN       Note: Above Lab results reviewed.  Recent Imaging Review  NM Myocar Multi W/Spect W/Wall Motion / EF   The study is normal. The study is low risk.   No ST deviation was noted.   LV perfusion is  normal. There is no evidence of ischemia. There is no  evidence of infarction.   Left ventricular function is normal. Nuclear stress EF: 72%. The left  ventricular ejection fraction is hyperdynamic (>65%). End diastolic cavity  size is normal.  Conclusion Normal pharmacologic myocardial perfusion scan No evidence of stress-induced myocardial ischemia Ejection fraction of 72% with normal wall motion This is a low risk study Note: Reviewed        Physical Exam  Vitals: BP (!) 133/49 (BP Location: Left Arm, Patient Position: Sitting, Cuff Size: Normal)   Pulse 63   Temp (!) 97.2 F (36.2 C) (Temporal)   Resp 18   Ht 5' (1.524 m)   Wt 187 lb (84.8 kg)   SpO2 99%   BMI 36.52 kg/m  BMI: Estimated body mass index is 36.52 kg/m as calculated from the following:   Height as of this encounter: 5' (1.524 m).   Weight as of this encounter: 187 lb (84.8 kg). Ideal: Ideal body weight: 45.5 kg (100 lb 4.9 oz) Adjusted ideal body weight: 61.2 kg (134 lb 15.8 oz) General appearance: Well nourished, well developed, and well hydrated. In no apparent acute distress Mental status: Alert, oriented x 3 (person, place, & time)       Respiratory: No evidence of acute respiratory distress Eyes: PERLA  Musculoskeletal: +LBP, Left leg pain worse with walking and standing Assessment   Diagnosis Status  1. Chronic pain syndrome   2. Spinal cord stimulator status   3. Lumbar radiculopathy   4. Medication management   5. Spinal stenosis of lumbosacral region  6. Sciatic leg pain    Controlled Controlled Controlled   Updated Problems: No problems updated.  Plan of Care  Problem-specific:  Assessment and Plan Chronic pain syndrome: Patient's pain is well-controlled with Levorphanol, will continue with current medication regimen.  Prescribing drug monitoring (PDMP) reviewed, findings consistent with the use of prescribed medication and no evidence of narcotic misuse or abuse.  Urine drug  screening (UDS) up to date.  Schedule follow-up in 30 days for medication management evaluation.   Spinal cord stimulator status:  The patient reports a burning sensation at the site of her SCS (from [employer]) battery.  She also reports experiencing severe cramping in her feet and legs, radiating up to her upper legs.  I advised her to contact her Museum/gallery curator to evaluate the issue, as it may be related to the battery malfunction or charging this problems.  Alternatively, she can come to the office, and we will contact the representative here to address her concerns.  Lumbar radiculopathy: The patient continues struggling with low back pain however, patient's pain is well-controlled with Levorphanol, will continue with current medication regimen.    [name] has a current medication list which includes the following long-term medication(s): bupropion , levothyroxine, metoprolol succinate, omeprazole, pregabalin, quetiapine , and trazodone .  Pharmacotherapy (Medications Ordered): Meds ordered this encounter  Medications   levorphanol (LEVODROMORAN) 2 MG tablet    Sig: Take 1 tablet (2 mg total) by mouth every 8 (eight) hours as needed for up to 7 days for pain.    Dispense:  21 tablet    Refill:  0   levorphanol (LEVODROMORAN) 2 MG tablet    Sig: Take 1 tablet (2 mg total) by mouth every 8 (eight) hours as needed for pain.    Dispense:  90 tablet    Refill:  0   Orders:  No orders of the defined types were placed in this encounter.       Return in about 1 month (around 03/06/2024) for (F2F), (MM), Emmy Blanch NP.    Recent Visits Date Type Provider Dept  12/29/23 Office Visit Jonaven Hilgers K, NP [facility]  11/30/23 Office Visit Mohit Zirbes K, NP [facility]  Showing recent visits within past 90 days and meeting all other requirements Today's Visits Date Type Provider Dept  02/04/24 Office Visit Calem Cocozza K, NP  [facility]  Showing today's visits and meeting all other requirements Future Appointments Date Type Provider Dept  02/29/24 Appointment Romolo Sieling K, NP [facility]  03/28/24 Appointment Hollis Oh K, NP [facility]  Showing future appointments within next 90 days and meeting all other requirements  I discussed the assessment and treatment plan with the patient. The patient was provided an opportunity to ask questions and all were answered. The patient agreed with the plan and demonstrated an understanding of the instructions.  Patient advised to call back or seek an in-person evaluation if the symptoms or condition worsens.  I personally spent a total of 30 minutes in the care of the patient today including preparing to see the patient, getting/reviewing separately obtained history, performing a medically appropriate exam/evaluation, counseling and educating, placing orders, referring and communicating with other health care professionals, documenting clinical information in the EHR, independently interpreting results, communicating results, and coordinating care.   Note by: Emmy MARLA Blanch, NP Date: 02/04/2024; Time: 10:49 AM

## 2024-02-04 NOTE — Progress Notes (Signed)
 Nursing Pain Medication Assessment:  Safety precautions to be maintained throughout the outpatient stay will include: orient to surroundings, keep bed in low position, maintain call bell within reach at all times, provide assistance with transfer out of bed and ambulation.  Medication Inspection Compliance: Pill count conducted under aseptic conditions, in front of the patient. Neither the pills nor the bottle was removed from the patient's sight at any time. Once count was completed pills were immediately returned to the patient in their original bottle.  Medication: Oxycodone  IR Pill/Patch Count: 0 of 90 pills/patches remain Pill/Patch Appearance: Markings consistent with prescribed medication Bottle Appearance: Standard pharmacy container. Clearly labeled. Filled Date: 09 / 09 / 2025 Last Medication intake:  Today

## 2024-02-09 ENCOUNTER — Encounter: Payer: Self-pay | Admitting: Psychiatry

## 2024-02-09 ENCOUNTER — Telehealth: Admitting: Psychiatry

## 2024-02-09 DIAGNOSIS — F3342 Major depressive disorder, recurrent, in full remission: Secondary | ICD-10-CM | POA: Diagnosis not present

## 2024-02-09 DIAGNOSIS — G2581 Restless legs syndrome: Secondary | ICD-10-CM | POA: Diagnosis not present

## 2024-02-09 DIAGNOSIS — R52 Pain, unspecified: Secondary | ICD-10-CM

## 2024-02-09 DIAGNOSIS — F411 Generalized anxiety disorder: Secondary | ICD-10-CM | POA: Diagnosis not present

## 2024-02-09 DIAGNOSIS — G4701 Insomnia due to medical condition: Secondary | ICD-10-CM | POA: Diagnosis not present

## 2024-02-09 MED ORDER — QUETIAPINE FUMARATE 50 MG PO TABS: 50.0000 mg | ORAL_TABLET | Freq: Every evening | ORAL | 1 refills | Status: AC | PRN

## 2024-02-09 NOTE — Progress Notes (Signed)
 Virtual Visit via Video Note  I connected with Connie [name] on 02/09/24 at 11:40 AM EDT by a video enabled telemedicine application and verified that I am speaking with the correct person using two identifiers.  Location Provider Location : [facility] Patient Location : Home  Participants: Patient , Provider   I discussed the limitations of evaluation and management by telemedicine and the availability of in person appointments. The patient expressed understanding and agreed to proceed.   I discussed the assessment and treatment plan with the patient. The patient was provided an opportunity to ask questions and all were answered. The patient agreed with the plan and demonstrated an understanding of the instructions.   The patient was advised to call back or seek an in-person evaluation if the symptoms worsen or if the condition fails to improve as anticipated.  BH MD OP Progress Note  02/09/2024 12:58 PM Connie [name]  MRN:  [id number]  Chief Complaint:  Chief Complaint  Patient presents with   Follow-up   Anxiety   Depression   Medication Refill  Discussed the use of AI scribe software for clinical note transcription with the patient, who gave verbal consent to proceed.  History of Present Illness Connie [name] is a 68 year old Caucasian female, married, lives in [place], has a history of MDD, GAD, brief psychotic disorder, insomnia, obstructive sleep apnea currently not on CPAP, fibromyalgia, hearing loss, hypothyroidism, polyarthralgia was evaluated by telemedicine today, presented for a follow-up appointment.  Over the past 4 months, she has experienced significant mood fluctuations and feelings of exhaustion, which she links to ongoing difficulties obtaining her long-term pain medication, levorphanol. During this period, she received a prescription for oxycodone  3 times daily as a replacement, which she reports did not adequately control her pain and resulted in withdrawal  symptoms between doses. She describes disrupted sleep, waking at night with symptoms such as a runny nose, sneezing, and pain, which she associates with withdrawal and insufficient pain management. These challenges have contributed to feelings of dread and passive thoughts about whether she would have to live the rest of her life in this state; however, she clarifies that she does not have any suicidality.  After she resumed levorphanol (most recently receiving a partial fill of 20 pills on October 9), she reports improved pain control and better sleep over the past few days. Ongoing uncertainty regarding her medication supply continues to cause her concern about its impact on her well-being.  Currently, she takes Seroquel  (quetiapine ) 50 mg at bedtime nightly to help with sleep, Wellbutrin  XL (bupropion ) 150 mg in the morning for depression, and trazodone  as needed. She denies any concerns or questions about her antidepressants at this time. She denies any current perceptual disturbances.    Visit Diagnosis:    ICD-10-CM   1. MDD (major depressive disorder), recurrent, in full remission  F33.42     2. GAD (generalized anxiety disorder)  F41.1     3. Insomnia due to medical condition  G47.01 QUEtiapine  (SEROQUEL ) 50 MG tablet   Pain, restless leg symptoms      Past Psychiatric History: I have reviewed past psychiatric history from progress note on 08/06/2017.  Past trials of Latuda, Wellbutrin , Cymbalta .  Past Medical History:  Past Medical History:  Diagnosis Date   Anemia    in the past   Anxiety    Arthritis    all over   Cervical spondylosis with myelopathy 2018   Chronic pain 2019   can't stand  straight, uses walker for stability   DDD (degenerative disc disease), lumbar    Depression    Displacement of cervical intervertebral disc 2018   Dyspnea    HANDICAPPED   Fibromyalgia    Polymyalgia Rheumatica   GERD (gastroesophageal reflux disease)    Heart murmur    not treated  or being followed   Hypertension    CONTROLLED   Hypothyroidism    Lumbar post-laminectomy syndrome 2018   Lumbosacral radiculitis 2018   Neuropathy due to medical condition    feet, toes, fingers   Osteoarthritis    Osteoporosis    Other long term (current) drug therapy 2019   from pain management   Pain in the coccyx 2018   disorder of sacrum   Sleep apnea    Spondylolisthesis 2018   Spondylosis of lumbosacral region without myelopathy or radiculopathy 2018   Spondylosis with myelopathy, lumbar region 2018   Vitamin D deficiency     Past Surgical History:  Procedure Laterality Date   ABDOMINAL HYSTERECTOMY  2000   APPENDECTOMY     arm surgery  2002   BREAST BIOPSY Left 2005   benign   CATARACT EXTRACTION W/PHACO Right 10/21/2017   Procedure: CATARACT EXTRACTION PHACO AND INTRAOCULAR LENS PLACEMENT (IOC) RIGHT;  Surgeon: Mittie Gaskin, MD;  [facility];  Service: Ophthalmology;  Laterality: Right;  neck issues   CESAREAN SECTION     x 2   CHOLECYSTECTOMY     COLONOSCOPY WITH PROPOFOL  N/A 08/25/2017   Procedure: COLONOSCOPY WITH PROPOFOL ;  Surgeon: Toledo, Ladell POUR, MD;  Location: [facility];  Service: Gastroenterology;  Laterality: N/A;   COLONOSCOPY WITH PROPOFOL  N/A 02/25/2022   Procedure: COLONOSCOPY WITH PROPOFOL ;  Surgeon: Maryruth Ole DASEN, MD;  Location: [facility];  Service: Endoscopy;  Laterality: N/A;   epidural injection  2018   steroids   GASTRIC BYPASS  2004   JOINT REPLACEMENT Bilateral 2005   knees   LUMBAR FUSION  2016   x 2/ L3-5   SHOULDER ARTHROSCOPY WITH ROTATOR CUFF REPAIR Right 2008   x 3   SPINAL CORD STIMULATOR IMPLANT  2018   [employer]   TEMPORAL ARTERY BIOPSY / LIGATION     TONSILLECTOMY      Family Psychiatric History: I have reviewed family psychiatric history from progress note on 08/06/2017.  Family History:  Family History  Problem Relation Age of Onset   Kidney disease Mother    Cancer  Mother    Anxiety disorder Mother    Depression Mother    Breast cancer Mother    Cancer Father    Hypertension Father    Cancer Brother    Breast cancer Maternal Aunt    Breast cancer Cousin     Social History: I have reviewed social history from progress note on 08/06/2017. Social History   Socioeconomic History   Marital status: Married    Spouse name: brady    Number of children: 2   Years of education: Not on file   Highest education level: High school graduate  Occupational History    Comment: disabled   Tobacco Use   Smoking status: Former    Current packs/day: 0.00    Average packs/day: 0.5 packs/day for 20.0 years (10.0 ttl pk-yrs)    Types: Cigarettes    Start date: 05/21/1987    Quit date: 05/21/2007    Years since quitting: 16.7   Smokeless tobacco: Never  Vaping Use   Vaping status: Never Used  Substance and Sexual Activity   Alcohol use: No   Drug use: No    Comment: managed by pain clinic   Sexual activity: Not Currently  Other Topics Concern   Not on file  Social History Narrative   Not on file   Social Drivers of Health   Financial Resource Strain: Low Risk  (11/23/2023)   Received from [facility]   Overall Financial Resource Strain (CARDIA)    Difficulty of Paying Living Expenses: Not very hard  Food Insecurity: No Food Insecurity (11/23/2023)   Received from [facility]   Hunger Vital Sign    Within the past 12 months, you worried that your food would run out before you got the money to buy more.: Never true    Within the past 12 months, the food you bought just didn't last and you didn't have money to get more.: Never true  Transportation Needs: No Transportation Needs (11/23/2023)   Received from [facility] - Transportation    In the past 12 months, has lack of transportation kept you from medical appointments or from getting medications?: No    Lack of Transportation  (Non-Medical): No  Physical Activity: Inactive (05/20/2017)   Exercise Vital Sign    Days of Exercise per Week: 0 days    Minutes of Exercise per Session: 0 min  Stress: Not on file  Social Connections: Moderately Integrated (05/20/2017)   Social Connection and Isolation Panel    Frequency of Communication with Friends and Family: Three times a week    Frequency of Social Gatherings with Friends and Family: Once a week    Attends Religious Services: More than 4 times per year    Active Member of [employer] or Organizations: No    Attends Engineer, structural: Never    Marital Status: Married    Allergies: No Known Allergies  Metabolic Disorder Labs: No results found for: HGBA1C, MPG No results found for: PROLACTIN No results found for: CHOL, TRIG, HDL, CHOLHDL, VLDL, LDLCALC No results found for: TSH  Therapeutic Level Labs: No results found for: LITHIUM No results found for: VALPROATE No results found for: CBMZ  Current Medications: Current Outpatient Medications  Medication Sig Dispense Refill   azelastine (ASTELIN) 0.1 % nasal spray Place into the nose.     buPROPion  (WELLBUTRIN  XL) 150 MG 24 hr tablet TAKE 1 TABLET(150 MG) BY MOUTH DAILY 90 tablet 3   celecoxib  (CELEBREX ) 100 MG capsule Take by mouth.     Cyanocobalamin (VITAMIN B-12 IJ) Inject as directed. Once a month     fluticasone (FLONASE) 50 MCG/ACT nasal spray Place into the nose.     furosemide (LASIX) 40 MG tablet Take by mouth.     levorphanol (LEVODROMORAN) 2 MG tablet Take 1 tablet (2 mg total) by mouth every 8 (eight) hours as needed for up to 7 days for pain. 21 tablet 0   [START ON 02/11/2024] levorphanol (LEVODROMORAN) 2 MG tablet Take 1 tablet (2 mg total) by mouth every 8 (eight) hours as needed for pain. 90 tablet 0   levothyroxine (SYNTHROID) 50 MCG tablet      metoprolol succinate (TOPROL-XL) 100 MG 24 hr tablet Take 100 mg by mouth once daily in the evening  0    omeprazole (PRILOSEC) 20 MG capsule Take 20 mg by mouth daily.     pregabalin (LYRICA) 150 MG capsule Take 150 mg by mouth 3 (three) times daily.  QUEtiapine  (SEROQUEL ) 50 MG tablet Take 1 tablet (50 mg total) by mouth at bedtime as needed. 90 tablet 1   tirzepatide (ZEPBOUND) 2.5 MG/0.5ML Pen Inject 2.5 mg into the skin once a week.     traZODone  (DESYREL ) 50 MG tablet TAKE 1 TO 2 TABLETS(50 TO 100 MG) BY MOUTH AT BEDTIME AS NEEDED FOR SLEEP 180 tablet 2   No current facility-administered medications for this visit.     Musculoskeletal: Strength & Muscle Tone: UTA Gait & Station: Seated Patient leans: N/A  Psychiatric Specialty Exam: Review of Systems  Psychiatric/Behavioral:  Positive for sleep disturbance. The patient is nervous/anxious.     There were no vitals taken for this visit.There is no height or weight on file to calculate BMI.  General Appearance: Casual  Eye Contact:  Fair  Speech:  Normal Rate  Volume:  Normal  Mood:  Anxious situational  Affect:  Appropriate  Thought Process:  Goal Directed and Descriptions of Associations: Intact  Orientation:  Full (Time, Place, and Person)  Thought Content: Logical   Suicidal Thoughts:  No  Homicidal Thoughts:  No  Memory:  Immediate;   Fair Recent;   Fair Remote;   Fair  Judgement:  Fair  Insight:  Fair  Psychomotor Activity:  Normal  Concentration:  Concentration: Fair and Attention Span: Fair  Recall:  [employer] of Knowledge: Fair  Language: Fair  Akathisia:  No  Handed:  Right  AIMS (if indicated): not done  Assets:  Manufacturing systems engineer Desire for Improvement Housing Social Support Transportation  ADL's:  Intact  Cognition: WNL  Sleep:  improving     Screenings: Geneticist, molecular Office Visit from 09/16/2022 in [facility] Office Visit from 06/17/2022 in [facility] Office Visit from 04/16/2022 in [facility] Visit from 02/12/2022 in [facility] Visit from 01/16/2022 in [facility]  AIMS Total Score 0 0 0 0 0   GAD-7    Flowsheet Row Office Visit from 09/08/2023 in [facility] Office Visit from 11/18/2022 in [facility] Visit from 09/16/2022 [facility] Office Visit from 06/17/2022 in [facility] Office Visit from 04/16/2022 in [facility]  Total GAD-7 Score 3 0 0 0 2   PHQ2-9    Flowsheet Row Office Visit from 02/04/2024 in [facility] Visit from 12/29/2023 in [facility] Visit from 11/30/2023 in [facility] Visit from 11/03/2023 in [facility] Visit from 09/08/2023 in [facility]  PHQ-2 Total Score 0 4 2 0 2  PHQ-9 Total Score -- -- -- -- 5   Flowsheet Row Video Visit from 02/09/2024 in [facility] Video Visit from 12/07/2023 in [facility] Office Visit from 09/08/2023 in [facility]  C-SSRS RISK CATEGORY No Risk No Risk No Risk     Assessment and Plan: Daisja Kessinger is a 68 year old Caucasian female with history of MDD, GAD, chronic pain, who was evaluated by telemedicine today.  Discussed assessment and plan as noted  below.  1. MDD (major depressive disorder), recurrent, in full remission Currently reports mood symptoms are stable  with the exception of recent worsening when she ran out of her levorphanol for pain management due to it being out of stock.  Currently she is back on her levorphanol and that helped. Continue Trazodone  50-100 mg at bedtime as needed Continue Seroquel  50 mg at bedtime as needed. Continue Wellbutrin  XL 150 mg daily  2. GAD (generalized anxiety disorder)-stable Currently denies any significant anxiety symptoms Continue psychotherapy sessions with Ms. Almarie Ligas has upcoming appointment.  3. Insomnia due to medical condition-improving Since she is back on the levorphanol sleep has been Continue Trazodone  as prescribed Continue Seroquel  50 mg at bedtime as needed.  Follow-up Follow-up in clinic in 3 months or sooner in person.    Collaboration of Care: Collaboration of Care: Referral or follow-up with counselor/therapist AEB encouraged to continue psychotherapy sessions  Patient/Guardian was advised Release of Information must be obtained prior to any record release in order to collaborate their care with an outside provider. Patient/Guardian was advised if they have not already done so to contact the registration department to sign all necessary forms in order for us  to release information regarding their care.   Consent: Patient/Guardian gives verbal consent for treatment and assignment of benefits for services provided during this visit. Patient/Guardian expressed understanding and agreed to proceed.   This note was generated in part or whole with voice recognition software. Voice recognition is usually quite accurate but there are transcription errors that can and very often do occur. I apologize for any typographical errors that were not detected and corrected.    Leopoldo Mazzie, MD 02/10/2024, 8:25 AM

## 2024-02-10 ENCOUNTER — Ambulatory Visit (INDEPENDENT_AMBULATORY_CARE_PROVIDER_SITE_OTHER): Admitting: Professional Counselor

## 2024-02-10 DIAGNOSIS — F411 Generalized anxiety disorder: Secondary | ICD-10-CM | POA: Diagnosis not present

## 2024-02-10 DIAGNOSIS — F3342 Major depressive disorder, recurrent, in full remission: Secondary | ICD-10-CM
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
## 2024-02-24 ENCOUNTER — Ambulatory Visit: Admitting: Professional Counselor

## 2024-02-24 DIAGNOSIS — F3342 Major depressive disorder, recurrent, in full remission: Secondary | ICD-10-CM | POA: Diagnosis not present

## 2024-02-24 DIAGNOSIS — F411 Generalized anxiety disorder: Secondary | ICD-10-CM | POA: Diagnosis not present

## 2024-02-24 NOTE — Progress Notes (Signed)
  THERAPIST PROGRESS NOTE  Session Time: 2:02 PM - 2:59 PM   Participation Level: Active  Behavioral Response: Well Groomed, Alert, Dysphoric  Type of Therapy: Individual Therapy  Treatment Goals addressed: Active Anxiety  LTG: It's hard for me to think about myself. I've got my husband and he's becoming more of a responsibility for me. My goal is to make sure he's taken care of; that he has everything he needs.    Start:  02/24/24    Expected End:  02/22/25     STG: I want to be able to get out and do some things that other people are doing but I'm limited. To improve recreation activities AEB identifying resources to accommodate mobility issues over the next 12 weeks.   STG: I have never been assertive. To improve assertive communication AEB utilizing interpersonal effectiveness skills over the next 12 weeks.    STG: My self-worth is lacking. To reduce impact of childhood/traumatic history AEB processing and restructuring maladaptive patterns of thinking over the next 12 weeks.   ProgressTowards Goals: Initial  Interventions: CBT, Motivational Interviewing, and Supportive  Summary: Connie [name] is a 68 y.o. female who presents with a history of anxiety, depression, and brief psychotic disorder. She appeared alert and oriented x5. She stated things are going okay. She shared some secondary stressors with her daughter. [name] engaged in developing her treatment plan. She was receptive to psychoeducation and coping skills.   Therapist Response: Conducted session with [name]. Began session with check-in/update since previous session. Utilized empathetic and reflective listening. Developed treatment plan with input from [name] on current strengths and needs. Used open-ended questions to facilitate discussion and summarized [name]'s thoughts/feelings. Provided psychoeducation on CBT and coping skills. Engaged [name] in 5-4-3-2-1 grounding mechanism. Encouraged [name] to begin practicing coping  skills. Scheduled additional appointment and concluded session.   Suicidal/Homicidal: No  Plan: Return again in 2 weeks.  Diagnosis: GAD (generalized anxiety disorder)  MDD (major depressive disorder), recurrent, in full remission  Collaboration of Care: Medication Management AEB chart review  Patient/Guardian was advised Release of Information must be obtained prior to any record release in order to collaborate their care with an outside provider. Patient/Guardian was advised if they have not already done so to contact the registration department to sign all necessary forms in order for us  to release information regarding their care.   Consent: Patient/Guardian gives verbal consent for treatment and assignment of benefits for services provided during this visit. Patient/Guardian expressed understanding and agreed to proceed.   [name], [facility] 02/24/2024

## 2024-02-29 ENCOUNTER — Encounter: Payer: Self-pay | Admitting: Nurse Practitioner

## 2024-02-29 ENCOUNTER — Telehealth: Payer: Self-pay | Admitting: Psychiatry

## 2024-02-29 ENCOUNTER — Ambulatory Visit: Attending: Nurse Practitioner | Admitting: Nurse Practitioner

## 2024-02-29 ENCOUNTER — Other Ambulatory Visit: Payer: Self-pay | Admitting: Psychiatry

## 2024-02-29 VITALS — BP 102/60 | HR 70 | Temp 98.7°F | Resp 16 | Ht 60.0 in | Wt 187.0 lb

## 2024-02-29 DIAGNOSIS — G894 Chronic pain syndrome: Secondary | ICD-10-CM | POA: Insufficient documentation

## 2024-02-29 DIAGNOSIS — G4701 Insomnia due to medical condition: Secondary | ICD-10-CM

## 2024-02-29 DIAGNOSIS — Z9689 Presence of other specified functional implants: Secondary | ICD-10-CM | POA: Diagnosis present

## 2024-02-29 DIAGNOSIS — M5416 Radiculopathy, lumbar region: Secondary | ICD-10-CM | POA: Insufficient documentation

## 2024-02-29 DIAGNOSIS — M4807 Spinal stenosis, lumbosacral region: Secondary | ICD-10-CM | POA: Diagnosis present

## 2024-02-29 DIAGNOSIS — Z79899 Other long term (current) drug therapy: Secondary | ICD-10-CM | POA: Insufficient documentation

## 2024-02-29 DIAGNOSIS — M5430 Sciatica, unspecified side: Secondary | ICD-10-CM | POA: Insufficient documentation

## 2024-02-29 MED ORDER — LEVORPHANOL TARTRATE 2 MG PO TABS: 2.0000 mg | ORAL_TABLET | Freq: Three times a day (TID) | ORAL | 0 refills | Status: DC | PRN

## 2024-02-29 NOTE — Telephone Encounter (Signed)
 I do not see an appointment scheduled for this patient with me , will have staff contact patient.

## 2024-02-29 NOTE — Progress Notes (Signed)
 PROVIDER NOTE: Interpretation of information contained herein should be left to medically-trained personnel. Specific patient instructions are provided elsewhere under Patient Instructions section of medical record. This document was created in part using AI and STT-dictation technology, any transcriptional errors that may result from this process are unintentional.  Patient: Connie [name]  Service: E/M   PCP: Cyrus Selinda Moose, PA-C  DOB: 29-Mar-1956  DOS: 02/29/2024  Provider: Emmy MARLA Blanch, NP  MRN: [id number]  Delivery: Face-to-face  Specialty: Interventional Pain Management  Type: Established Patient  Setting: Ambulatory outpatient facility  Specialty designation: 09  Referring Prov.: Cyrus Selinda Moose,*  Location: Outpatient office facility       History of present illness (HPI) Connie [name], a 68 y.o. year old female, is here today because of her chronic pain syndrome and low back pain. Connie [name] primary complain today is Back Pain  Pertinent problems: [name]  has Degenerative joint disease of cervical and lumbar spine; Lumbar spondylosis; History of lumbar fusion; Lumbar degenerative disc disease; Fibromyalgia; Spinal cord stimulator status; Generalized osteoarthritis of multiple sites; Lumbar postlaminectomy syndrome; Spinal stenosis of lumbar region without neurogenic claudication; and Chronic pain syndrome on their pertinent problem list.  Pain Assessment: Severity of Chronic pain is reported as a 4 /10. Location: Back Lower/to left leg to foot. Onset: More than a month ago. Quality: Aching, Burning, Contraction, Throbbing. Timing: Constant. Modifying factor(s): sitting, meds. Vitals:  height is 5' (1.524 m) and weight is 187 lb (84.8 kg). Her temperature is 98.7 F (37.1 C). Her blood pressure is 102/60 and her pulse is 70. Her respiration is 16 and oxygen saturation is 95%.  BMI: Estimated body mass index is 36.52 kg/m as calculated from the following:    Height as of this encounter: 5' (1.524 m).   Weight as of this encounter: 187 lb (84.8 kg).  Last encounter: 02/04/2024. Last procedure: Visit date not found.  Reason for encounter: medication management.   Discussed the use of AI scribe software for clinical note transcription with the patient, who gave verbal consent to proceed.  History of Present Illness   Connie [name] is a 68 year old female who presents for medication management.  Patient continues experiencing low back pain, however, she is currently on levorphanol 2 mg every 8 hours as needed for pain and reports no pain. She denies any side effects or adverse reaction from levorphanol. There was a previous issue with the pharmacy's ability to supply her medication, initially filled for one month. The pharmacy now has the medication available, and she is seeking a refill for three months. She uses [facility], [place], for her prescriptions.     Pharmacotherapy Assessment   levorphanol (Levodromoran) 2 mg tab every 8 hours as needed for pain. MME=66 (Started on 02/04/2024) Monitoring: [place] PMP: PDMP reviewed during this encounter.       Pharmacotherapy: No side-effects or adverse reactions reported. Compliance: No problems identified. Effectiveness: Clinically acceptable.  [name], RN  02/29/2024 11:05 AM  Sign when Signing Visit Nursing Pain Medication Assessment:  Safety precautions to be maintained throughout the outpatient stay will include: orient to surroundings, keep bed in low position, maintain call bell within reach at all times, provide assistance with transfer out of bed and ambulation.  Medication Inspection Compliance: Pill count conducted under aseptic conditions, in front of the patient. Neither the pills nor the bottle was removed from the patient's sight at any time. Once count was completed pills were  immediately returned to the patient in their original bottle.  Medication: levorphanol Pill/Patch  Count: 29 of 90 pills/patches remain Pill/Patch Appearance: Markings consistent with prescribed medication Bottle Appearance: Standard pharmacy container. Clearly labeled. Filled Date: 59 / 14 / 2025 Last Medication intake:  Today    UDS:  Summary  Date Value Ref Range Status  08/13/2023 FINAL  Final    Comment:    ==================================================================== [employer] Select 13 (MW) ==================================================================== Test                             Result       Flag       Units    NO DRUGS DETECTED. ==================================================================== Test                      Result    Flag   Units      Ref Range   Creatinine              233              mg/dL      >=79 ==================================================================== Declared Medications:  The flagging and interpretation on this report are based on the  following declared medications.  Unexpected results may arise from  inaccuracies in the declared medications.   **Note: The testing scope of this panel does not include the  following reported medications:   Azelastine (Astelin)  Bupropion  (Wellbutrin  XL)  Celecoxib  (Celebrex )  Cyanocobalamin  Fluticasone (Flonase)  Furosemide (Lasix)  Levorphanol  Levothyroxine (Synthroid)  Metoprolol (Toprol)  Omeprazole (Prilosec)  Pregabalin (Lyrica)  Quetiapine  (Seroquel )  Trazodone  (Desyrel ) ==================================================================== For clinical consultation, please call [phone]. ====================================================================     No results found for: CBDTHCR No results found for: D8THCCBX No results found for: D9THCCBX  ROS  Constitutional: Denies any fever or chills Gastrointestinal: No reported hemesis, hematochezia, vomiting, or acute GI distress Musculoskeletal: Back pain Neurological: No reported episodes of  acute onset apraxia, aphasia, dysarthria, agnosia, amnesia, paralysis, loss of coordination, or loss of consciousness  Medication Review  Cyanocobalamin, QUEtiapine , azelastine, buPROPion , celecoxib , fluticasone, furosemide, levorphanol, levothyroxine, metoprolol succinate, omeprazole, pregabalin, tirzepatide, and traZODone   History Review  Allergy: Ms. Wardell has no known allergies. Drug: Ms. Lagrand  reports no history of drug use. Alcohol:  reports no history of alcohol use. Tobacco:  reports that she quit smoking about 16 years ago. Her smoking use included cigarettes. She started smoking about 36 years ago. She has a 10 pack-year smoking history. She has never used smokeless tobacco. Social: Ms. Lukes  reports that she quit smoking about 16 years ago. Her smoking use included cigarettes. She started smoking about 36 years ago. She has a 10 pack-year smoking history. She has never used smokeless tobacco. She reports that she does not drink alcohol and does not use drugs. Medical:  has a past medical history of Anemia, Anxiety, Arthritis, Cervical spondylosis with myelopathy (2018), Chronic pain (2019), DDD (degenerative disc disease), lumbar, Depression, Displacement of cervical intervertebral disc (2018), Dyspnea, Fibromyalgia, GERD (gastroesophageal reflux disease), Heart murmur, Hypertension, Hypothyroidism, Lumbar post-laminectomy syndrome (2018), Lumbosacral radiculitis (2018), Neuropathy due to medical condition, Osteoarthritis, Osteoporosis, Other long term (current) drug therapy (2019), Pain in the coccyx (2018), Sleep apnea, Spondylolisthesis (2018), Spondylosis of lumbosacral region without myelopathy or radiculopathy (2018), Spondylosis with myelopathy, lumbar region (2018), and Vitamin D deficiency. Surgical: Ms. Wafer  has a past surgical history that includes Cesarean section; Gastric bypass (  2004); Lumbar fusion (2016); Spinal cord stimulator implant (2018); Cholecystectomy;  Appendectomy; Tonsillectomy; Shoulder arthroscopy with rotator cuff repair (Right, 2008); Joint replacement (Bilateral, 2005); Abdominal hysterectomy (2000); epidural injection (2018); arm surgery (2002); Breast biopsy (Left, 2005); Colonoscopy with propofol  (N/A, 08/25/2017); Temporal artery biopsy / ligation; Cataract extraction w/PHACO (Right, 10/21/2017); and Colonoscopy with propofol  (N/A, 02/25/2022). Family: family history includes Anxiety disorder in her mother; Breast cancer in her cousin, maternal aunt, and mother; Cancer in her brother, father, and mother; Depression in her mother; Hypertension in her father; Kidney disease in her mother.  Laboratory Chemistry Profile   Renal Lab Results  Component Value Date   BUN 18 06/23/2017   CREATININE 0.60 06/23/2017   GFRAA >60 06/23/2017   GFRNONAA >60 06/23/2017    Hepatic Lab Results  Component Value Date   AST 20 06/23/2017   ALT 9 (L) 06/23/2017   ALBUMIN 3.6 06/23/2017   ALKPHOS 88 06/23/2017    Electrolytes Lab Results  Component Value Date   NA 135 06/23/2017   K 4.9 06/23/2017   CL 102 06/23/2017   CALCIUM 8.5 (L) 06/23/2017    Bone No results found for: VD25OH, VD125OH2TOT, CI6874NY7, CI7874NY7, 25OHVITD1, 25OHVITD2, 25OHVITD3, TESTOFREE, TESTOSTERONE  Inflammation (CRP: Acute Phase) (ESR: Chronic Phase) No results found for: CRP, ESRSEDRATE, LATICACIDVEN       Note: Above Lab results reviewed.  Recent Imaging Review  NM Myocar Multi W/Spect W/Wall Motion / EF   The study is normal. The study is low risk.   No ST deviation was noted.   LV perfusion is normal. There is no evidence of ischemia. There is no  evidence of infarction.   Left ventricular function is normal. Nuclear stress EF: 72%. The left  ventricular ejection fraction is hyperdynamic (>65%). End diastolic cavity  size is normal.  Conclusion Normal pharmacologic myocardial perfusion scan No evidence of stress-induced  myocardial ischemia Ejection fraction of 72% with normal wall motion This is a low risk study Note: Reviewed        Physical Exam  Vitals: BP 102/60   Pulse 70   Temp 98.7 F (37.1 C)   Resp 16   Ht 5' (1.524 m)   Wt 187 lb (84.8 kg)   SpO2 95%   BMI 36.52 kg/m  BMI: Estimated body mass index is 36.52 kg/m as calculated from the following:   Height as of this encounter: 5' (1.524 m).   Weight as of this encounter: 187 lb (84.8 kg). Ideal: Ideal body weight: 45.5 kg (100 lb 4.9 oz) Adjusted ideal body weight: 61.2 kg (134 lb 15.8 oz) General appearance: Well nourished, well developed, and well hydrated. In no apparent acute distress Mental status: Alert, oriented x 3 (person, place, & time)       Respiratory: No evidence of acute respiratory distress Eyes: PERLA  Musculoskeletal: + low back pain  Assessment   Diagnosis Status  1. Chronic pain syndrome   2. Lumbar radiculopathy   3. Medication management   4. Sciatic leg pain   5. Spinal cord stimulator status   6. Spinal stenosis of lumbosacral region    Controlled Controlled Controlled   Updated Problems: No problems updated.  Plan of Care  Problem-specific:  Assessment and Plan    Chronic pain syndrome: The patient is on levorphanol and is experiencing no side effects or adverse reaction from current medication. The patient's pain is well-controlled with levorphanol, will continue on current medication regimen.  Prescribing drug monitoring (PMP) reviewed, findings  consistent with the use of prescribed medication and no evidence of narcotic misuse or abuse.  Urine drug screening (UDS) up to date and consistent with the use of prescribed medication.  No side effects or adverse reaction reported to medication.  Schedule follow-up in 90 days for medication management.  Lumbar radiculopathy: Patient continues experiencing low back pain, however, current pain medication regimen help to manage her pain level and  functional mobility.  She reported her pain level 4/10 at today's visit.  She has a history of spinal cord stimulator from [employer].  SCS continues to help with her low back pain.   Medication management: Medications   levorphanol (LEVODROMORAN) 2 MG tablet    Sig: Take 1 tablet (2 mg total) by mouth every 8 (eight) hours as needed for pain.    Dispense:  90 tablet    Refill:  0   levorphanol (LEVODROMORAN) 2 MG tablet    Sig: Take 1 tablet (2 mg total) by mouth every 8 (eight) hours as needed for pain.    Dispense:  90 tablet    Refill:  0   levorphanol (LEVODROMORAN) 2 MG tablet    Sig: Take 1 tablet (2 mg total) by mouth every 8 (eight) hours as needed for pain.    Dispense:  90 tablet    Refill:  0       Ms. Connie Ferraris Denzler has a current medication list which includes the following long-term medication(s): bupropion , levothyroxine, metoprolol succinate, omeprazole, pregabalin, quetiapine , and trazodone .  Pharmacotherapy (Medications Ordered): Meds ordered this encounter  Medications   levorphanol (LEVODROMORAN) 2 MG tablet    Sig: Take 1 tablet (2 mg total) by mouth every 8 (eight) hours as needed for pain.    Dispense:  90 tablet    Refill:  0   levorphanol (LEVODROMORAN) 2 MG tablet    Sig: Take 1 tablet (2 mg total) by mouth every 8 (eight) hours as needed for pain.    Dispense:  90 tablet    Refill:  0   levorphanol (LEVODROMORAN) 2 MG tablet    Sig: Take 1 tablet (2 mg total) by mouth every 8 (eight) hours as needed for pain.    Dispense:  90 tablet    Refill:  0   Orders:  No orders of the defined types were placed in this encounter.       Return in about 3 months (around 05/31/2024) for (F2F), (MM), Emmy Blanch NP.    Recent Visits Date Type Provider Dept  02/04/24 Office Visit Zahra Peffley K, NP [facility]  12/29/23 Office Visit Lynita Groseclose K, NP [facility]  Showing recent visits within past 90 days and meeting all other  requirements Today's Visits Date Type Provider Dept  02/29/24 Office Visit Katria Botts K, NP [facility]  Showing today's visits and meeting all other requirements Future Appointments Date Type Provider Dept  03/28/24 Appointment Jarriel Papillion K, NP [facility]  Showing future appointments within next 90 days and meeting all other requirements  I discussed the assessment and treatment plan with the patient. The patient was provided an opportunity to ask questions and all were answered. The patient agreed with the plan and demonstrated an understanding of the instructions.  Patient advised to call back or seek an in-person evaluation if the symptoms or condition worsens.  I personally spent a total of 30 minutes in the care of the patient today including preparing to see the  patient, getting/reviewing separately obtained history, performing a medically appropriate exam/evaluation, counseling and educating, placing orders, referring and communicating with other health care professionals, documenting clinical information in the EHR, independently interpreting results, communicating results, and coordinating care.   Note by: Elyna Pangilinan K Reiko Vinje, NP (TTS and AI technology used. I apologize for any typographical errors that were not detected and corrected.) Date: 02/29/2024; Time: 12:00 PM

## 2024-02-29 NOTE — Progress Notes (Signed)
 Nursing Pain Medication Assessment:  Safety precautions to be maintained throughout the outpatient stay will include: orient to surroundings, keep bed in low position, maintain call bell within reach at all times, provide assistance with transfer out of bed and ambulation.  Medication Inspection Compliance: Pill count conducted under aseptic conditions, in front of the patient. Neither the pills nor the bottle was removed from the patient's sight at any time. Once count was completed pills were immediately returned to the patient in their original bottle.  Medication: levorphanol Pill/Patch Count: 29 of 90 pills/patches remain Pill/Patch Appearance: Markings consistent with prescribed medication Bottle Appearance: Standard pharmacy container. Clearly labeled. Filled Date: 18 / 14 / 2025 Last Medication intake:  Today

## 2024-02-29 NOTE — Telephone Encounter (Signed)
30 minutes.

## 2024-02-29 NOTE — Patient Instructions (Signed)

## 2024-03-07 ENCOUNTER — Ambulatory Visit (INDEPENDENT_AMBULATORY_CARE_PROVIDER_SITE_OTHER): Admitting: Professional Counselor

## 2024-03-07 DIAGNOSIS — F3342 Major depressive disorder, recurrent, in full remission: Secondary | ICD-10-CM

## 2024-03-07 DIAGNOSIS — F411 Generalized anxiety disorder: Secondary | ICD-10-CM

## 2024-03-07 NOTE — Progress Notes (Signed)
 THERAPIST PROGRESS NOTE  Virtual Visit via Video Note  I connected with Connie [name] on 03/07/24 at  2:00 PM EST by a video enabled telemedicine application and verified that I am speaking with the correct person using two identifiers.  Location: Patient: Home Provider: Remote office   I discussed the limitations of evaluation and management by telemedicine and the availability of in person appointments. The patient expressed understanding and agreed to proceed.   I discussed the assessment and treatment plan with the patient. The patient was provided an opportunity to ask questions and all were answered. The patient agreed with the plan and demonstrated an understanding of the instructions.   The patient was advised to call back or seek an in-person evaluation if the symptoms worsen or if the condition fails to improve as anticipated.  I provided 26 minutes of non-face-to-face time during this encounter. Connie [name], [facility]  Session Time: 2:08 PM - 2:34 PM   Participation Level: Active  Behavioral Response: Well Groomed, Alert, Dysphoric  Type of Therapy: Individual Therapy  Treatment Goals addressed: Active Anxiety  LTG: It's hard for me to think about myself. I've got my husband and he's becoming more of a responsibility for me. My goal is to make sure he's taken care of; that he has everything he needs.                Start:  02/24/24    Expected End:  02/22/25      STG: I want to be able to get out and do some things that other people are doing but I'm limited. To improve recreation activities AEB identifying resources to accommodate mobility issues over the next 12 weeks.    STG: I have never been assertive. To improve assertive communication AEB utilizing interpersonal effectiveness skills over the next 12 weeks.     STG: My self-worth is lacking. To reduce impact of childhood/traumatic history AEB processing and restructuring maladaptive patterns of  thinking over the next 12 weeks.   ProgressTowards Goals: Progressing  Interventions: CBT, Motivational Interviewing, and Supportive  Summary: [name] is a 68 y.o. female who presents with a history of anxiety and depression. She appeared alert and oriented x5. She stated that there hasn't been much updates since last session. She discussed a few situations that she felt didn't turn out the way she wanted, which confirms her beliefs that it's always something that goes wrong. However, she was receptive to focusing on the big picture and ways those situations did work out in the end. [name] noted she isn't sure what they will do for Thanksgiving as her children don't let her know until the last minute. She was receptive to focusing on what she can control and choosing what she prefers to do without waiting on an answer from them. She stated she might go ahead and have food catered and if they show, great, if not that's okay too.   Therapist Response: Conducted session with [name]. Began session with check-in/update since previous session. Utilized empathetic and reflective listening. Used open-ended questions to facilitate discussion and summarized [name]'s thoughts/feelings. Encouraged [name] to focus on other factors in situations and final outcomes. Discussed how to focus on what's in her control rather than leaving things up to others. Confirmed next appointment and concluded session.   Suicidal/Homicidal: No  Plan: Return again in 3 weeks.  Diagnosis: MDD (major depressive disorder), recurrent, in full remission  GAD (generalized anxiety disorder)  Collaboration of Care:  Medication Management AEB chart review  Patient/Guardian was advised Release of Information must be obtained prior to any record release in order to collaborate their care with an outside provider. Patient/Guardian was advised if they have not already done so to contact the registration department to sign all necessary forms  in order for us  to release information regarding their care.   Consent: Patient/Guardian gives verbal consent for treatment and assignment of benefits for services provided during this visit. Patient/Guardian expressed understanding and agreed to proceed.   Connie [name], [facility] 03/07/2024

## 2024-03-17 ENCOUNTER — Telehealth: Payer: Self-pay | Admitting: Internal Medicine

## 2024-03-17 NOTE — Telephone Encounter (Signed)
 Called three times left voicemail all three times. Closing encounter.

## 2024-03-28 ENCOUNTER — Ambulatory Visit (INDEPENDENT_AMBULATORY_CARE_PROVIDER_SITE_OTHER): Admitting: Professional Counselor

## 2024-03-28 ENCOUNTER — Ambulatory Visit: Attending: Nurse Practitioner | Admitting: Nurse Practitioner

## 2024-03-28 ENCOUNTER — Encounter: Payer: Self-pay | Admitting: Nurse Practitioner

## 2024-03-28 VITALS — BP 132/49 | HR 72 | Temp 96.8°F | Resp 18 | Ht 60.0 in | Wt 179.0 lb

## 2024-03-28 DIAGNOSIS — Z79899 Other long term (current) drug therapy: Secondary | ICD-10-CM | POA: Diagnosis present

## 2024-03-28 DIAGNOSIS — Z9689 Presence of other specified functional implants: Secondary | ICD-10-CM | POA: Insufficient documentation

## 2024-03-28 DIAGNOSIS — Z981 Arthrodesis status: Secondary | ICD-10-CM | POA: Diagnosis present

## 2024-03-28 DIAGNOSIS — F3342 Major depressive disorder, recurrent, in full remission: Secondary | ICD-10-CM | POA: Diagnosis not present

## 2024-03-28 DIAGNOSIS — M4807 Spinal stenosis, lumbosacral region: Secondary | ICD-10-CM | POA: Diagnosis present

## 2024-03-28 DIAGNOSIS — M5430 Sciatica, unspecified side: Secondary | ICD-10-CM | POA: Insufficient documentation

## 2024-03-28 DIAGNOSIS — G894 Chronic pain syndrome: Secondary | ICD-10-CM | POA: Insufficient documentation

## 2024-03-28 DIAGNOSIS — M5136 Other intervertebral disc degeneration, lumbar region with discogenic back pain only: Secondary | ICD-10-CM | POA: Insufficient documentation

## 2024-03-28 DIAGNOSIS — M5416 Radiculopathy, lumbar region: Secondary | ICD-10-CM | POA: Insufficient documentation

## 2024-03-28 DIAGNOSIS — F411 Generalized anxiety disorder: Secondary | ICD-10-CM

## 2024-03-28 MED ORDER — LEVORPHANOL TARTRATE 2 MG PO TABS: 2.0000 mg | ORAL_TABLET | Freq: Three times a day (TID) | ORAL | 0 refills | Status: DC | PRN

## 2024-03-28 NOTE — Progress Notes (Signed)
 THERAPIST PROGRESS NOTE  Virtual Visit via Video Note  I connected with Amantha Sklar Diop on 03/28/24 at  3:00 PM EST by a video enabled telemedicine application and verified that I am speaking with the correct person using two identifiers.  Location: Patient: Home Provider: Remote office   I discussed the limitations of evaluation and management by telemedicine and the availability of in person appointments. The patient expressed understanding and agreed to proceed.   I discussed the assessment and treatment plan with the patient. The patient was provided an opportunity to ask questions and all were answered. The patient agreed with the plan and demonstrated an understanding of the instructions.   The patient was advised to call back or seek an in-person evaluation if the symptoms worsen or if the condition fails to improve as anticipated.  I provided 30 minutes of non-face-to-face time during this encounter. [name], [facility]  Session Time: 3:01 PM - 3:31 PM   Participation Level: Active  Behavioral Response: Well Groomed, Alert, Euthymic  Type of Therapy: Individual Therapy  Treatment Goals addressed: Active Anxiety  LTG: It's hard for me to think about myself. I've got my husband and he's becoming more of a responsibility for me. My goal is to make sure he's taken care of; that he has everything he needs.                Start:  02/24/24    Expected End:  02/22/25      STG: I want to be able to get out and do some things that other people are doing but I'm limited. To improve recreation activities AEB identifying resources to accommodate mobility issues over the next 12 weeks.    STG: I have never been assertive. To improve assertive communication AEB utilizing interpersonal effectiveness skills over the next 12 weeks.     STG: My self-worth is lacking. To reduce impact of childhood/traumatic history AEB processing and restructuring maladaptive patterns of  thinking over the next 12 weeks.   ProgressTowards Goals: Progressing  Interventions: Motivational Interviewing and Supportive  Summary: Jesscia Imm is a 68 y.o. female who presents with a history of anxiety and depression. She appeared alert and oriented x5. She stated her children ended up coming over for Thanksgiving and she cooked a big meal. [name] noted it went well. She reported now she has to get ready for Christmas. She noted plans for decorating and minimizing holiday stress. She expressed concern about one of her grandsons and identified a plan to ask him to help with decorations. [name] reported things are going well overall.  Therapist Response: Conducted session with [name]. Began session with check-in/update since previous session. Utilized empathetic and reflective listening. Used open-ended questions to facilitate discussion and summarized [name]'s thoughts/feelings. Explored ways to minimize holiday stress and address concerns about grandson. Scheduled additional appointment and concluded session.   Suicidal/Homicidal: No  Plan: Return again in 5 weeks.  Diagnosis: GAD (generalized anxiety disorder)  MDD (major depressive disorder), recurrent, in full remission  Collaboration of Care: Medication Management AEB chart review  Patient/Guardian was advised Release of Information must be obtained prior to any record release in order to collaborate their care with an outside provider. Patient/Guardian was advised if they have not already done so to contact the registration department to sign all necessary forms in order for us  to release information regarding their care.   Consent: Patient/Guardian gives verbal consent for treatment and assignment of benefits for services provided  during this visit. Patient/Guardian expressed understanding and agreed to proceed.   [name], [facility] 03/28/2024

## 2024-03-28 NOTE — Progress Notes (Signed)
 PROVIDER NOTE: Interpretation of information contained herein should be left to medically-trained personnel. Specific patient instructions are provided elsewhere under Patient Instructions section of medical record. This document was created in part using AI and STT-dictation technology, any transcriptional errors that may result from this process are unintentional.  Patient: Connie [name]  Service: E/M   PCP: Cyrus Selinda Moose, PA-C  DOB: 12-11-1955  DOS: 03/28/2024  Provider: Emmy MARLA Blanch, NP  MRN: [id number]  Delivery: Face-to-face  Specialty: Interventional Pain Management  Type: Established Patient  Setting: Ambulatory outpatient facility  Specialty designation: 09  Referring Prov.: Cyrus Selinda Moose,*  Location: Outpatient office facility       History of present illness (HPI) [name], a 68 y.o. year old female, is here today because of her Chronic pain syndrome [G89.4]. [name] primary complain today is Back Pain and Neck Pain  Pertinent problems: [name] has Degenerative joint disease of cervical and lumbar spine; Lumbar spondylosis; History of lumbar fusion; Lumbar degenerative disc disease; Fibromyalgia; Cervicalgia; DDD (degenerative disc disease), cervical; Chronic pain syndrome; Spinal cord stimulator status; Chiari malformation type I (HCC); Acquired spondylolisthesis; Chronic midline low back pain; Chronic pain; Dysphagia, oropharyngeal phase; Generalized osteoarthritis of multiple sites; Rotator cuff arthropathy of both shoulders; Osteoporosis; and Severe obesity (BMI >= 40) (HCC) on their pertinent problem list.  Pain Assessment: Severity of Chronic pain is reported as a 4 /10. Location: Back Lower/radiates down left leg to ankle in the side and back. Onset: More than a month ago. Quality: Aching, Burning, Shooting. Timing: Intermittent. Modifying factor(s): sitting, resting. Vitals:  height is 5' (1.524 m) and weight is 179 lb (81.2 kg). Her  temperature is 96.8 F (36 C) (abnormal). Her blood pressure is 132/49 (abnormal) and her pulse is 72. Her respiration is 18 and oxygen saturation is 98%.  BMI: Estimated body mass index is 34.96 kg/m as calculated from the following:   Height as of this encounter: 5' (1.524 m).   Weight as of this encounter: 179 lb (81.2 kg).  Last encounter: 02/29/2024. Last procedure: Visit date not found.  Reason for encounter: medication management. No change in medical history since last visit.  Patient's pain is at baseline.  Patient continues multimodal pain regimen as prescribed.  States that it provides pain relief and improvement in functional status.   Discussed the use of AI scribe software for clinical note transcription with the patient, who gave verbal consent to proceed.  History of Present Illness   Connie [name] is a 68 year old female who presents with difficulty obtaining her prescribed medication, levorphanol. She is experiencing difficulty obtaining her prescribed medication, levorphanol, due to it being out of stock at her usual pharmacy, [facility]. She was able to fill the prescription in October, but encountered the same issue of it being out of stock this month. She has not yet checked with other pharmacies this month as it is not yet time to refill the prescription. She has not personally contacted [facility], but the nurse has reached out on her behalf of her related to medication availability.   She takes levorphanol three times a day. She still has an active prescription for oxycodone  at [facility], which she is using temporarily, although she notes it is not effective for her pain management.     Pharmacotherapy Assessment   levorphanol (Levodromoran) 2 mg tab every 8 hours as needed for pain. MME=66 (Started on 02/04/2024) Monitoring: [place] PMP: PDMP reviewed during  this encounter.       Pharmacotherapy: No side-effects or adverse reactions reported. Compliance: No problems  identified. Effectiveness: Clinically acceptable.  Rebecka Wolm HERO, RN  03/28/2024  1:59 PM  Sign when Signing Visit Nursing Pain Medication Assessment:  Safety precautions to be maintained throughout the outpatient stay will include: orient to surroundings, keep bed in low position, maintain call bell within reach at all times, provide assistance with transfer out of bed and ambulation.  Medication Inspection Compliance: Pill count conducted under aseptic conditions, in front of the patient. Neither the pills nor the bottle was removed from the patient's sight at any time. Once count was completed pills were immediately returned to the patient in their original bottle.  Medication: Oxycodone  IR Pill/Patch Count: 41 of 90 pills/patches remain Pill/Patch Appearance: Markings consistent with prescribed medication Bottle Appearance: Standard pharmacy container. Clearly labeled. Filled Date: 84 / 58 / 2025 Last Medication intake:  Today    UDS:  Summary  Date Value Ref Range Status  08/13/2023 FINAL  Final    Comment:    ==================================================================== [employer] Select 13 (MW) ==================================================================== Test                             Result       Flag       Units    NO DRUGS DETECTED. ==================================================================== Test                      Result    Flag   Units      Ref Range   Creatinine              233              mg/dL      >=79 ==================================================================== Declared Medications:  The flagging and interpretation on this report are based on the  following declared medications.  Unexpected results may arise from  inaccuracies in the declared medications.   **Note: The testing scope of this panel does not include the  following reported medications:   Azelastine (Astelin)  Bupropion  (Wellbutrin  XL)  Celecoxib  (Celebrex )   Cyanocobalamin  Fluticasone (Flonase)  Furosemide (Lasix)  Levorphanol  Levothyroxine (Synthroid)  Metoprolol (Toprol)  Omeprazole (Prilosec)  Pregabalin (Lyrica)  Quetiapine  (Seroquel )  Trazodone  (Desyrel ) ==================================================================== For clinical consultation, please call [phone]. ====================================================================     No results found for: CBDTHCR No results found for: D8THCCBX No results found for: D9THCCBX  ROS  Constitutional: Denies any fever or chills Gastrointestinal: No reported hemesis, hematochezia, vomiting, or acute GI distress Musculoskeletal: Low back pain, neck pain Neurological: No reported episodes of acute onset apraxia, aphasia, dysarthria, agnosia, amnesia, paralysis, loss of coordination, or loss of consciousness  Medication Review  Cyanocobalamin, QUEtiapine , azelastine, buPROPion , celecoxib , fluticasone, furosemide, levorphanol, levothyroxine, metoprolol succinate, omeprazole, pregabalin, and traZODone   History Review  Allergy: [name] has no known allergies. Drug: [name]  reports no history of drug use. Alcohol:  reports no history of alcohol use. Tobacco:  reports that she quit smoking about 16 years ago. Her smoking use included cigarettes. She started smoking about 36 years ago. She has a 10 pack-year smoking history. She has never used smokeless tobacco. Social: [name]  reports that she quit smoking about 16 years ago. Her smoking use included cigarettes. She started smoking about 36 years ago. She has a 10 pack-year smoking history. She has never used smokeless tobacco. She  reports that she does not drink alcohol and does not use drugs. Medical:  has a past medical history of Anemia, Anxiety, Arthritis, Cervical spondylosis with myelopathy (2018), Chronic pain (2019), DDD (degenerative disc disease), lumbar, Depression, Displacement of cervical  intervertebral disc (2018), Dyspnea, Fibromyalgia, GERD (gastroesophageal reflux disease), Heart murmur, Hypertension, Hypothyroidism, Lumbar post-laminectomy syndrome (2018), Lumbosacral radiculitis (2018), Neuropathy due to medical condition, Osteoarthritis, Osteoporosis, Other long term (current) drug therapy (2019), Pain in the coccyx (2018), Sleep apnea, Spondylolisthesis (2018), Spondylosis of lumbosacral region without myelopathy or radiculopathy (2018), Spondylosis with myelopathy, lumbar region (2018), and Vitamin D deficiency. Surgical: [name]  has a past surgical history that includes Cesarean section; Gastric bypass (2004); Lumbar fusion (2016); Spinal cord stimulator implant (2018); Cholecystectomy; Appendectomy; Tonsillectomy; Shoulder arthroscopy with rotator cuff repair (Right, 2008); Joint replacement (Bilateral, 2005); Abdominal hysterectomy (2000); epidural injection (2018); arm surgery (2002); Breast biopsy (Left, 2005); Colonoscopy with propofol  (N/A, 08/25/2017); Temporal artery biopsy / ligation; Cataract extraction w/PHACO (Right, 10/21/2017); and Colonoscopy with propofol  (N/A, 02/25/2022). Family: family history includes Anxiety disorder in her mother; Breast cancer in her cousin, maternal aunt, and mother; Cancer in her brother, father, and mother; Depression in her mother; Hypertension in her father; Kidney disease in her mother.  Laboratory Chemistry Profile   Renal Lab Results  Component Value Date   BUN 18 06/23/2017   CREATININE 0.60 06/23/2017   GFRAA >60 06/23/2017   GFRNONAA >60 06/23/2017    Hepatic Lab Results  Component Value Date   AST 20 06/23/2017   ALT 9 (L) 06/23/2017   ALBUMIN 3.6 06/23/2017   ALKPHOS 88 06/23/2017    Electrolytes Lab Results  Component Value Date   NA 135 06/23/2017   K 4.9 06/23/2017   CL 102 06/23/2017   CALCIUM 8.5 (L) 06/23/2017    Bone No results found for: VD25OH, VD125OH2TOT, CI6874NY7, CI7874NY7, 25OHVITD1,  25OHVITD2, 25OHVITD3, TESTOFREE, TESTOSTERONE  Inflammation (CRP: Acute Phase) (ESR: Chronic Phase) No results found for: CRP, ESRSEDRATE, LATICACIDVEN       Note: Above Lab results reviewed.  Recent Imaging Review  NM Myocar Multi W/Spect W/Wall Motion / EF   The study is normal. The study is low risk.   No ST deviation was noted.   LV perfusion is normal. There is no evidence of ischemia. There is no  evidence of infarction.   Left ventricular function is normal. Nuclear stress EF: 72%. The left  ventricular ejection fraction is hyperdynamic (>65%). End diastolic cavity  size is normal.  Conclusion Normal pharmacologic myocardial perfusion scan No evidence of stress-induced myocardial ischemia Ejection fraction of 72% with normal wall motion This is a low risk study Note: Reviewed        Physical Exam  Vitals: BP (!) 132/49   Pulse 72   Temp (!) 96.8 F (36 C)   Resp 18   Ht 5' (1.524 m)   Wt 179 lb (81.2 kg)   SpO2 98%   BMI 34.96 kg/m  BMI: Estimated body mass index is 34.96 kg/m as calculated from the following:   Height as of this encounter: 5' (1.524 m).   Weight as of this encounter: 179 lb (81.2 kg). Ideal: Ideal body weight: 45.5 kg (100 lb 4.9 oz) Adjusted ideal body weight: 59.8 kg (131 lb 12.6 oz) General appearance: Well nourished, well developed, and well hydrated. In no apparent acute distress Mental status: Alert, oriented x 3 (person, place, & time)       Respiratory: No evidence of  acute respiratory distress Eyes: PERLA  Musculoskeletal: +LBP Neck pain Assessment   Diagnosis Status  1. Chronic pain syndrome   2. Lumbar radiculopathy   3. Medication management   4. Sciatic leg pain   5. Spinal cord stimulator status   6. Spinal stenosis of lumbosacral region   7. History of lumbar fusion   8. Degeneration of intervertebral disc of lumbar region with discogenic back pain    Controlled Controlled Controlled   Updated  Problems: No problems updated.  Plan of Care  Problem-specific:  Assessment and Plan    Chronic pain syndrome Levorphanol and oxycodone  management affected by pharmacy stock issues. Advised temporary continuation of oxycodone . - Sent levorphanol prescription for one-month supply to [facility]. - Changed levorphanol pharmacy to [facility]. - Instructed her to inquire at [facility] about levorphanol availability. - Advised continuation of oxycodone  until levorphanol is available.  Patient's pain is controlled with levorphanol, however, issue with backorder at pharmacy the patient does not get levorphanol from last month.  Advised her to continue with oxycodone .  We discussed and changed the pharmacy to [facility] for [facility]orphanol. Prescribing drug monitoring (PDMP) reviewed, findings consistent with the use of prescribed medication and no evidence of narcotic misuse or abuse.  Urine drug screening (UDS) up to date.  No new side effects or adverse reaction reported to oxycodone .  Schedule follow-up in 30 days for medication management.      [name] has a current medication list which includes the following long-term medication(s): bupropion , levothyroxine, metoprolol succinate, omeprazole, pregabalin, quetiapine , and trazodone .  Pharmacotherapy (Medications Ordered): Meds ordered this encounter  Medications   levorphanol (LEVODROMORAN) 2 MG tablet    Sig: Take 1 tablet (2 mg total) by mouth every 8 (eight) hours as needed for pain.    Dispense:  90 tablet    Refill:  0   Orders:  No orders of the defined types were placed in this encounter.     Return in about 1 month (around 04/28/2024) for (F2F), (MM), Emmy Blanch NP.    Recent Visits Date Type Provider Dept  02/29/24 Office Visit Malkie Wille K, NP [facility]  02/04/24 Office Visit Ermalinda Joubert K, NP [facility]  12/29/23 Office Visit Tymothy Cass K, NP  [facility]  Showing recent visits within past 90 days and meeting all other requirements Today's Visits Date Type Provider Dept  03/28/24 Office Visit Niamya Vittitow K, NP [facility]  Showing today's visits and meeting all other requirements Future Appointments Date Type Provider Dept  05/04/24 Appointment Kyesha Balla K, NP [facility]  Showing future appointments within next 90 days and meeting all other requirements  I discussed the assessment and treatment plan with the patient. The patient was provided an opportunity to ask questions and all were answered. The patient agreed with the plan and demonstrated an understanding of the instructions.  Patient advised to call back or seek an in-person evaluation if the symptoms or condition worsens.  I personally spent a total of 30 minutes in the care of the patient today including preparing to see the patient, getting/reviewing separately obtained history, performing a medically appropriate exam/evaluation, counseling and educating, placing orders, referring and communicating with other health care professionals, documenting clinical information in the EHR, independently interpreting results, communicating results, and coordinating care.   Note by: Beryl Balz K Treyce Spillers, NP (TTS and AI technology used. I apologize for any typographical errors  that were not detected and corrected.) Date: 03/28/2024; Time: 3:02 PM

## 2024-03-28 NOTE — Progress Notes (Signed)
 Nursing Pain Medication Assessment:  Safety precautions to be maintained throughout the outpatient stay will include: orient to surroundings, keep bed in low position, maintain call bell within reach at all times, provide assistance with transfer out of bed and ambulation.  Medication Inspection Compliance: Pill count conducted under aseptic conditions, in front of the patient. Neither the pills nor the bottle was removed from the patient's sight at any time. Once count was completed pills were immediately returned to the patient in their original bottle.  Medication: Oxycodone  IR Pill/Patch Count: 41 of 90 pills/patches remain Pill/Patch Appearance: Markings consistent with prescribed medication Bottle Appearance: Standard pharmacy container. Clearly labeled. Filled Date: 46 / 78 / 2025 Last Medication intake:  Today

## 2024-04-27 ENCOUNTER — Other Ambulatory Visit: Payer: Self-pay | Admitting: Nurse Practitioner

## 2024-04-27 ENCOUNTER — Other Ambulatory Visit: Payer: Self-pay | Admitting: Psychiatry

## 2024-04-27 DIAGNOSIS — F3342 Major depressive disorder, recurrent, in full remission: Secondary | ICD-10-CM

## 2024-05-02 ENCOUNTER — Ambulatory Visit (INDEPENDENT_AMBULATORY_CARE_PROVIDER_SITE_OTHER): Admitting: Professional Counselor

## 2024-05-02 DIAGNOSIS — F411 Generalized anxiety disorder: Secondary | ICD-10-CM | POA: Diagnosis not present

## 2024-05-02 DIAGNOSIS — F3342 Major depressive disorder, recurrent, in full remission: Secondary | ICD-10-CM | POA: Diagnosis not present
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
## 2024-05-02 NOTE — Progress Notes (Unsigned)
 PROVIDER NOTE: Interpretation of information contained herein should be left to medically-trained personnel. Specific patient instructions are provided elsewhere under Patient Instructions section of medical record. This document was created in part using AI and STT-dictation technology, any transcriptional errors that may result from this process are unintentional.  Patient: Connie [name]  Service: E/M   PCP: Cyrus Selinda Moose, PA-C  DOB: [birth year]  DOS: 05/04/2024  Provider: Emmy MARLA Blanch, NP  MRN: [id number]  Delivery: Face-to-face  Specialty: Interventional Pain Management  Type: Established Patient  Setting: Ambulatory outpatient facility  Specialty designation: 09  Referring Prov.: Cyrus, Selinda Moose, PA-C  Location: Outpatient office facility       History of present illness (HPI) Connie [name], a 69 y.o. year old female, is here today because of her No primary diagnosis found.. Connie [name] primary complain today is No chief complaint on file.  Pertinent problems: Connie [name] has Degenerative joint disease of cervical and lumbar spine; Lumbar spondylosis; History of lumbar fusion; Lumbar degenerative disc disease; Fibromyalgia; Cervicalgia; DDD (degenerative disc disease), cervical; Chronic pain syndrome; Spinal cord stimulator status; Chiari malformation type I (HCC); Acquired spondylolisthesis; Chronic midline low back pain; Chronic pain; Dysphagia, oropharyngeal phase; Generalized osteoarthritis of multiple sites; Rotator cuff arthropathy of both shoulders; Osteoporosis; and Severe obesity (BMI >= 40) (HCC) on their pertinent problem list.  Pain Assessment: Severity of   is reported as a  /10. Location:    / . Onset:  . Quality:  . Timing:  . Modifying factor(s):  SABRA Vitals:  vitals were not taken for this visit.  BMI: Estimated body mass index is 34.96 kg/m as calculated from the following:   Height as of 03/28/24: 5' (1.524 m).   Weight as of 03/28/24: 179 lb (81.2  kg).  Last encounter: 03/28/2024. Last procedure: Visit date not found.  Reason for encounter: {Blank single:19197::medication management,post-procedure evaluation and assessment,both, medication management and post-procedure evaluation and assessment,evaluation of worsening, or previously known (established) problem,patient-requested evaluation,follow-up evaluation,evaluation for possible interventional PM therapy/treatment,evaluation of new problem, *** }.   Discussed the use of AI scribe software for clinical note transcription with the patient, who gave verbal consent to proceed.  History of Present Illness           Pharmacotherapy Assessment   levorphanol (Levodromoran) 2 mg tab every 8 hours as needed for pain. MME=66 (Started on 02/04/2024) Monitoring: [place] PMP: PDMP reviewed during this encounter.       Pharmacotherapy: No side-effects or adverse reactions reported. Compliance: No problems identified. Effectiveness: Clinically acceptable.  Rebecka Wolm HERO, RN  03/28/2024  1:59 PM  Sign when Signing Visit Nursing Pain Medication Assessment:  Safety precautions to be maintained throughout the outpatient stay will include: orient to surroundings, keep bed in low position, maintain call bell within reach at all times, provide assistance with transfer out of bed and ambulation.  Medication Inspection Compliance: Pill count conducted under aseptic conditions, in front of the patient. Neither the pills nor the bottle was removed from the patient's sight at any time. Once count was completed pills were immediately returned to the patient in their original bottle.  Medication: Oxycodone  IR Pill/Patch Count: 41 of 90 pills/patches remain Pill/Patch Appearance: Markings consistent with prescribed medication Bottle Appearance: Standard pharmacy container. Clearly labeled. Filled Date: 49 / 19 / 2025 Last Medication intake:  Today    UDS:  Summary  Date Value Ref Range  Status  08/13/2023 FINAL  Final    Comment:    ====================================================================  [employer] Select 13 (MW) ==================================================================== Test                             Result       Flag       Units    NO DRUGS DETECTED. ==================================================================== Test                      Result    Flag   Units      Ref Range   Creatinine              233              mg/dL      >=79 ==================================================================== Declared Medications:  The flagging and interpretation on this report are based on the  following declared medications.  Unexpected results may arise from  inaccuracies in the declared medications.   **Note: The testing scope of this panel does not include the  following reported medications:   Azelastine (Astelin)  Bupropion  (Wellbutrin  XL)  Celecoxib  (Celebrex )  Cyanocobalamin  Fluticasone (Flonase)  Furosemide (Lasix)  Levorphanol  Levothyroxine (Synthroid)  Metoprolol (Toprol)  Omeprazole (Prilosec)  Pregabalin (Lyrica)  Quetiapine  (Seroquel )  Trazodone  (Desyrel ) ==================================================================== For clinical consultation, please call [phone]. ====================================================================     No results found for: CBDTHCR No results found for: D8THCCBX No results found for: D9THCCBX  ROS  Constitutional: Denies any fever or chills Gastrointestinal: No reported hemesis, hematochezia, vomiting, or acute GI distress Musculoskeletal: Low back pain, neck pain Neurological: No reported episodes of acute onset apraxia, aphasia, dysarthria, agnosia, amnesia, paralysis, loss of coordination, or loss of consciousness  Medication Review  Cyanocobalamin, QUEtiapine , azelastine, buPROPion , celecoxib , fluticasone, furosemide, levorphanol, levothyroxine,  metoprolol succinate, omeprazole, pregabalin, and traZODone   History Review  Allergy: Connie [name] has no known allergies. Drug: Connie [name]  reports no history of drug use. Alcohol:  reports no history of alcohol use. Tobacco:  reports that she quit smoking about 16 years ago. Her smoking use included cigarettes. She started smoking about 36 years ago. She has a 10 pack-year smoking history. She has never used smokeless tobacco. Social: Connie [name]  reports that she quit smoking about 16 years ago. Her smoking use included cigarettes. She started smoking about 36 years ago. She has a 10 pack-year smoking history. She has never used smokeless tobacco. She reports that she does not drink alcohol and does not use drugs. Medical:  has a past medical history of Anemia, Anxiety, Arthritis, Cervical spondylosis with myelopathy (2018), Chronic pain (2019), DDD (degenerative disc disease), lumbar, Depression, Displacement of cervical intervertebral disc (2018), Dyspnea, Fibromyalgia, GERD (gastroesophageal reflux disease), Heart murmur, Hypertension, Hypothyroidism, Lumbar post-laminectomy syndrome (2018), Lumbosacral radiculitis (2018), Neuropathy due to medical condition, Osteoarthritis, Osteoporosis, Other long term (current) drug therapy (2019), Pain in the coccyx (2018), Sleep apnea, Spondylolisthesis (2018), Spondylosis of lumbosacral region without myelopathy or radiculopathy (2018), Spondylosis with myelopathy, lumbar region (2018), and Vitamin D deficiency. Surgical: Ms. Kassa  has a past surgical history that includes Cesarean section; Gastric bypass (2004); Lumbar fusion (2016); Spinal cord stimulator implant (2018); Cholecystectomy; Appendectomy; Tonsillectomy; Shoulder arthroscopy with rotator cuff repair (Right, 2008); Joint replacement (Bilateral, 2005); Abdominal hysterectomy (2000); epidural injection (2018); arm surgery (2002); Breast biopsy (Left, 2005); Colonoscopy with propofol  (N/A, 08/25/2017);  Temporal artery biopsy / ligation; Cataract extraction w/PHACO (Right, 10/21/2017); and Colonoscopy with propofol  (N/A, 02/25/2022). Family: family history includes Anxiety disorder in her mother; Breast  cancer in her cousin, maternal aunt, and mother; Cancer in her brother, father, and mother; Depression in her mother; Hypertension in her father; Kidney disease in her mother.  Laboratory Chemistry Profile   Renal Lab Results  Component Value Date   BUN 18 06/23/2017   CREATININE 0.60 06/23/2017   GFRAA >60 06/23/2017   GFRNONAA >60 06/23/2017    Hepatic Lab Results  Component Value Date   AST 20 06/23/2017   ALT 9 (L) 06/23/2017   ALBUMIN 3.6 06/23/2017   ALKPHOS 88 06/23/2017    Electrolytes Lab Results  Component Value Date   NA 135 06/23/2017   K 4.9 06/23/2017   CL 102 06/23/2017   CALCIUM 8.5 (L) 06/23/2017    Bone No results found for: VD25OH, VD125OH2TOT, CI6874NY7, CI7874NY7, 25OHVITD1, 25OHVITD2, 25OHVITD3, TESTOFREE, TESTOSTERONE  Inflammation (CRP: Acute Phase) (ESR: Chronic Phase) No results found for: CRP, ESRSEDRATE, LATICACIDVEN       Note: Above Lab results reviewed.  Recent Imaging Review  NM Myocar Multi W/Spect W/Wall Motion / EF   The study is normal. The study is low risk.   No ST deviation was noted.   LV perfusion is normal. There is no evidence of ischemia. There is no  evidence of infarction.   Left ventricular function is normal. Nuclear stress EF: 72%. The left  ventricular ejection fraction is hyperdynamic (>65%). End diastolic cavity  size is normal.  Conclusion Normal pharmacologic myocardial perfusion scan No evidence of stress-induced myocardial ischemia Ejection fraction of 72% with normal wall motion This is a low risk study Note: Reviewed        Physical Exam  Vitals: BP (!) 132/49   Pulse 72   Temp (!) 96.8 F (36 C)   Resp 18   Ht 5' (1.524 m)   Wt 179 lb (81.2 kg)   SpO2 98%   BMI 34.96 kg/m   BMI: Estimated body mass index is 34.96 kg/m as calculated from the following:   Height as of this encounter: 5' (1.524 m).   Weight as of this encounter: 179 lb (81.2 kg). Ideal: Ideal body weight: 45.5 kg (100 lb 4.9 oz) Adjusted ideal body weight: 59.8 kg (131 lb 12.6 oz) General appearance: Well nourished, well developed, and well hydrated. In no apparent acute distress Mental status: Alert, oriented x 3 (person, place, & time)       Respiratory: No evidence of acute respiratory distress Eyes: PERLA  Musculoskeletal: +LBP Neck pain Assessment   Diagnosis Status  1. Chronic pain syndrome   2. Lumbar radiculopathy   3. Medication management   4. Sciatic leg pain   5. Spinal cord stimulator status   6. Spinal stenosis of lumbosacral region   7. History of lumbar fusion   8. Degeneration of intervertebral disc of lumbar region with discogenic back pain    Controlled Controlled Controlled   Updated Problems: No problems updated.  Plan of Care  Problem-specific:  Assessment and Plan    Monitoring: [place] PMP: PDMP not reviewed this encounter. {Blank single:19197::Unable to conduct review of the controlled substance reporting system due to technological failure.,     } Pharmacotherapy: {Blank single:19197::Opioid-induced constipation (OIC)(K59.03, T40.2X5A),No side-effects or adverse reactions reported.} Compliance: {Blank single:19197::Medication agreement violation - unsanctioned acquisition/use of additional opioid-containing medication,No problems identified.} Effectiveness: {Blank single:19197::Clinically acceptable.}  No notes on file  UDS:  Summary  Date Value Ref Range Status  08/13/2023 FINAL  Final    Comment:    ==================================================================== [employer] Select 13 (  MW) ==================================================================== Test                             Result       Flag       Units    NO  DRUGS DETECTED. ==================================================================== Test                      Result    Flag   Units      Ref Range   Creatinine              233              mg/dL      >=79 ==================================================================== Declared Medications:  The flagging and interpretation on this report are based on the  following declared medications.  Unexpected results may arise from  inaccuracies in the declared medications.   **Note: The testing scope of this panel does not include the  following reported medications:   Azelastine (Astelin)  Bupropion  (Wellbutrin  XL)  Celecoxib  (Celebrex )  Cyanocobalamin  Fluticasone (Flonase)  Furosemide (Lasix)  Levorphanol  Levothyroxine (Synthroid)  Metoprolol (Toprol)  Omeprazole (Prilosec)  Pregabalin (Lyrica)  Quetiapine  (Seroquel )  Trazodone  (Desyrel ) ==================================================================== For clinical consultation, please call [phone]. ====================================================================     No results found for: CBDTHCR No results found for: D8THCCBX No results found for: D9THCCBX  ROS  Constitutional: {Blank single:19197::Denies any fever or chills} Gastrointestinal: {Blank single:19197::No reported hemesis, hematochezia, vomiting, or acute GI distress} Musculoskeletal: {Blank single:19197::Denies any acute onset joint swelling, redness, loss of ROM, or weakness} Neurological: {Blank single:19197::No reported episodes of acute onset apraxia, aphasia, dysarthria, agnosia, amnesia, paralysis, loss of coordination, or loss of consciousness}  Medication Review  Cyanocobalamin, QUEtiapine , azelastine, buPROPion , celecoxib , fluticasone, furosemide, levorphanol, levothyroxine, metoprolol succinate, omeprazole, pregabalin, and traZODone   History Review  Allergy: Connie [name] has no known allergies. Drug: Connie [name]  reports  no history of drug use. Alcohol:  reports no history of alcohol use. Tobacco:  reports that she quit smoking about 16 years ago. Her smoking use included cigarettes. She started smoking about 36 years ago. She has a 10 pack-year smoking history. She has never used smokeless tobacco. Social: Connie [name]  reports that she quit smoking about 16 years ago. Her smoking use included cigarettes. She started smoking about 36 years ago. She has a 10 pack-year smoking history. She has never used smokeless tobacco. She reports that she does not drink alcohol and does not use drugs. Medical:  has a past medical history of Anemia, Anxiety, Arthritis, Cervical spondylosis with myelopathy (2018), Chronic pain (2019), DDD (degenerative disc disease), lumbar, Depression, Displacement of cervical intervertebral disc (2018), Dyspnea, Fibromyalgia, GERD (gastroesophageal reflux disease), Heart murmur, Hypertension, Hypothyroidism, Lumbar post-laminectomy syndrome (2018), Lumbosacral radiculitis (2018), Neuropathy due to medical condition, Osteoarthritis, Osteoporosis, Other long term (current) drug therapy (2019), Pain in the coccyx (2018), Sleep apnea, Spondylolisthesis (2018), Spondylosis of lumbosacral region without myelopathy or radiculopathy (2018), Spondylosis with myelopathy, lumbar region (2018), and Vitamin D deficiency. Surgical: Connie [name]  has a past surgical history that includes Cesarean section; Gastric bypass (2004); Lumbar fusion (2016); Spinal cord stimulator implant (2018); Cholecystectomy; Appendectomy; Tonsillectomy; Shoulder arthroscopy with rotator cuff repair (Right, 2008); Joint replacement (Bilateral, 2005); Abdominal hysterectomy (2000); epidural injection (2018); arm surgery (2002); Breast biopsy (Left, 2005); Colonoscopy with propofol  (N/A, 08/25/2017); Temporal artery biopsy / ligation; Cataract extraction w/PHACO (Right, 10/21/2017); and Colonoscopy with propofol  (N/A, 02/25/2022). Family: family  history includes Anxiety disorder in her mother; Breast cancer in her cousin, maternal aunt, and mother; Cancer in her brother, father, and mother; Depression in her mother; Hypertension in her father; Kidney disease in her mother.  Laboratory Chemistry Profile   Renal Lab Results  Component Value Date   BUN 18 06/23/2017   CREATININE 0.60 06/23/2017   GFRAA >60 06/23/2017   GFRNONAA >60 06/23/2017    Hepatic Lab Results  Component Value Date   AST 20 06/23/2017   ALT 9 (L) 06/23/2017   ALBUMIN 3.6 06/23/2017   ALKPHOS 88 06/23/2017    Electrolytes Lab Results  Component Value Date   NA 135 06/23/2017   K 4.9 06/23/2017   CL 102 06/23/2017   CALCIUM 8.5 (L) 06/23/2017    Bone No results found for: VD25OH, CI874NY7UNU, CI6874NY7, CI7874NY7, 25OHVITD1, 25OHVITD2, 25OHVITD3, TESTOFREE, TESTOSTERONE  Inflammation (CRP: Acute Phase) (ESR: Chronic Phase) No results found for: CRP, ESRSEDRATE, LATICACIDVEN       Note: {Blank single:19197::Ms. Mealy indicates labs done and monitored by primary care practitioner using a non-[facility] EMR system,No results found under the [facility] electronic medical record,Patient noncompliant with Lab Work orders.,Results made available to patient.,Lab results reviewed and made available to patient.,Lab results reviewed and explained to patient in Layman's terms.,Above Lab results reviewed.}  Recent Imaging Review  NM Myocar Multi W/Spect W/Wall Motion / EF   The study is normal. The study is low risk.   No ST deviation was noted.   LV perfusion is normal. There is no evidence of ischemia. There is no  evidence of infarction.   Left ventricular function is normal. Nuclear stress EF: 72%. The left  ventricular ejection fraction is hyperdynamic (>65%). End diastolic cavity  size is normal.  Conclusion Normal pharmacologic myocardial perfusion scan No evidence of stress-induced myocardial ischemia Ejection  fraction of 72% with normal wall motion This is a low risk study Note: {Blank single:19197::No new results found.,No results found under the [facility] electronic medical record.,Imaging results reviewed and explained to patient in Layman's terms.,Results of ordered imaging test(s) reviewed and explained to patient in Layman's terms.,Imaging results reviewed.,Reviewed} {Blank single:19197::Results visible under [facility].,Results visible under [facility].,Results visible under [facility].,Results visible under [facility].,Results visible under Care Everywhere.,Results made available to patient.,Copy of results provided to patient.,Patient noncompliant with diagnostic imaging orders.,     }  Physical Exam  Vitals: There were no vitals taken for this visit. BMI: Estimated body mass index is 34.96 kg/m as calculated from the following:   Height as of 03/28/24: 5' (1.524 m).   Weight as of 03/28/24: 179 lb (81.2 kg). Ideal: Patient weight not recorded General appearance: {general exam:210120802::Well nourished, well developed, and well hydrated. In no apparent acute distress} Mental status: {Blank single:19197::Alert and oriented x 3. Exaggerated physical and/or psychosocial pain behavior perceived.,Alert, oriented x 3 (person, place, & time)} {Blank single:19197::Connie [name] speech pattern and demeanor seems to suggest oversedation,     } Respiratory: {Blank single:19197::Oxygen-dependent COPD,No evidence of acute respiratory distress} Eyes: {Blank single:19197::Miotic (pupilary constriction) due to opiate use,Midriatic,Anisocoric,Evidence of ptosis,Pin-point pupils,PERLA}   Assessment   Diagnosis Status  No diagnosis found. {Blank single:19197::Absent,Deteriorating,Having a Flare-up,Improved,Improving,Not improving,Not  responding,Persistent,Present,Recurring,Reoccurring,Resolved,Responding,Stable,Unchanged,improved,Worsened,Worsening,Controlled} {Blank single:19197::Absent,Deteriorating,Having a Flare-up,Improved,Improving,Not improving,Not responding,Persistent,Present,Recurring,Reoccurring,Resolved,Responding,Stable,Unchanged,improved,Worsened,Worsening,Controlled} {Blank single:19197::Absent,Deteriorating,Having a Flare-up,Improved,Improving,Not improving,Not responding,Persistent,Present,Recurring,Reoccurring,Resolved,Responding,Stable,Unchanged,improved,Worsened,Worsening,Controlled}   Updated Problems: No problems updated.  Plan of Care  Problem-specific:  Assessment and Plan            Ms. Tristen Pennino Douthit has a current medication list which  includes the following long-term medication(s): bupropion , levothyroxine, metoprolol succinate, omeprazole, pregabalin, quetiapine , and trazodone .  Pharmacotherapy (Medications Ordered): No orders of the defined types were placed in this encounter.  Orders:  No orders of the defined types were placed in this encounter.    {There is no content from the last Plan section.}   No follow-ups on file.    Recent Visits Date Type Provider Dept  03/28/24 Office Visit Acy Orsak K, NP [facility]  02/29/24 Office Visit Cobi Aldape K, NP [facility]  02/04/24 Office Visit Janazia Schreier K, NP [facility]  Showing recent visits within past 90 days and meeting all other requirements Future Appointments Date Type Provider Dept  05/04/24 Appointment Eual Lindstrom K, NP [facility]  Showing future appointments within next 90 days and meeting all other requirements  I discussed the assessment and treatment plan with the patient. The patient was provided an opportunity to ask questions and all were answered. The patient agreed  with the plan and demonstrated an understanding of the instructions.  Patient advised to call back or seek an in-person evaluation if the symptoms or condition worsens.  Duration of encounter: *** minutes.  Total time on encounter, as per AMA guidelines included both the face-to-face and non-face-to-face time personally spent by the physician and/or other qualified health care professional(s) on the day of the encounter (includes time in activities that require the physician or other qualified health care professional and does not include time in activities normally performed by clinical staff). Physician's time may include the following activities when performed: Preparing to see the patient (e.g., pre-charting review of records, searching for previously ordered imaging, lab work, and nerve conduction tests) Review of prior analgesic pharmacotherapies. Reviewing PMP Interpreting ordered tests (e.g., lab work, imaging, nerve conduction tests) Performing post-procedure evaluations, including interpretation of diagnostic procedures Obtaining and/or reviewing separately obtained history Performing a medically appropriate examination and/or evaluation Counseling and educating the patient/family/caregiver Ordering medications, tests, or procedures Referring and communicating with other health care professionals (when not separately reported) Documenting clinical information in the electronic or other health record Independently interpreting results (not separately reported) and communicating results to the patient/ family/caregiver Care coordination (not separately reported)  Note by: Cavan Bearden K Bayani Renteria, NP (TTS and AI technology used. I apologize for any typographical errors that were not detected and corrected.) Date: 05/04/2024; Time: 1:00 PM

## 2024-05-04 ENCOUNTER — Ambulatory Visit: Attending: Nurse Practitioner | Admitting: Nurse Practitioner

## 2024-05-04 ENCOUNTER — Encounter: Payer: Self-pay | Admitting: Nurse Practitioner

## 2024-05-04 VITALS — BP 103/58 | HR 71 | Temp 97.2°F | Resp 18 | Ht 59.0 in | Wt 175.0 lb

## 2024-05-04 DIAGNOSIS — Z9689 Presence of other specified functional implants: Secondary | ICD-10-CM | POA: Diagnosis present

## 2024-05-04 DIAGNOSIS — Z981 Arthrodesis status: Secondary | ICD-10-CM | POA: Diagnosis present

## 2024-05-04 DIAGNOSIS — M5136 Other intervertebral disc degeneration, lumbar region with discogenic back pain only: Secondary | ICD-10-CM | POA: Diagnosis present

## 2024-05-04 DIAGNOSIS — Z79899 Other long term (current) drug therapy: Secondary | ICD-10-CM | POA: Insufficient documentation

## 2024-05-04 DIAGNOSIS — M5430 Sciatica, unspecified side: Secondary | ICD-10-CM | POA: Insufficient documentation

## 2024-05-04 DIAGNOSIS — M5416 Radiculopathy, lumbar region: Secondary | ICD-10-CM | POA: Insufficient documentation

## 2024-05-04 DIAGNOSIS — G894 Chronic pain syndrome: Secondary | ICD-10-CM | POA: Diagnosis present

## 2024-05-04 DIAGNOSIS — M4807 Spinal stenosis, lumbosacral region: Secondary | ICD-10-CM | POA: Diagnosis present

## 2024-05-04 MED ORDER — LEVORPHANOL TARTRATE 2 MG PO TABS: 2.0000 mg | ORAL_TABLET | Freq: Three times a day (TID) | ORAL | 0 refills | Status: AC | PRN

## 2024-05-04 NOTE — Patient Instructions (Signed)
 " ______________________________________________________________________    Opioid Pain Medication Update  To: All patients taking opioid pain medications. (I.e.: hydrocodone, hydromorphone , oxycodone , oxymorphone, morphine , codeine, methadone, tapentadol, tramadol, buprenorphine, fentanyl , etc.)  Re: Updated review of side effects and adverse reactions of opioid analgesics, as well as new information about long term effects of this class of medications.  Direct risks of long-term opioid therapy are not limited to opioid addiction and overdose. Potential medical risks include serious fractures, breathing problems during sleep, hyperalgesia, immunosuppression, chronic constipation, bowel obstruction, myocardial infarction, and tooth decay secondary to xerostomia.  Unpredictable adverse effects that can occur even if you take your medication correctly: Cognitive impairment, respiratory depression, and death. Most people think that if they take their medication correctly, and as instructed, that they will be safe. Nothing could be farther from the truth. In reality, a significant amount of recorded deaths associated with the use of opioids has occurred in individuals that had taken the medication for a long time, and were taking their medication correctly. The following are examples of how this can happen: Patient taking his/her medication for a long time, as instructed, without any side effects, is given a certain antibiotic or another unrelated medication, which in turn triggers a Drug-to-drug interaction leading to disorientation, cognitive impairment, impaired reflexes, respiratory depression or an untoward event leading to serious bodily harm or injury, including death.  Patient taking his/her medication for a long time, as instructed, without any side effects, develops an acute impairment of liver and/or kidney function. This will lead to a rapid inability of the body to breakdown and eliminate  their pain medication, which will result in effects similar to an overdose, but with the same medicine and dose that they had always taken. This again may lead to disorientation, cognitive impairment, impaired reflexes, respiratory depression or an untoward event leading to serious bodily harm or injury, including death.  A similar problem will occur with patients as they grow older and their liver and kidney function begins to decrease as part of the aging process.  Background information: Historically, the original case for using long-term opioid therapy to treat chronic noncancer pain was based on safety assumptions that subsequent experience has called into question. In 1996, the American Pain Society and the American Academy of Pain Medicine issued a consensus statement supporting long-term opioid therapy. This statement acknowledged the dangers of opioid prescribing but concluded that the risk for addiction was low; respiratory depression induced by opioids was short-lived, occurred mainly in opioid-naive patients, and was antagonized by pain; tolerance was not a common problem; and efforts to control diversion should not constrain opioid prescribing. This has now proven to be wrong. Experience regarding the risks for opioid addiction, misuse, and overdose in community practice has failed to support these assumptions.  According to the Centers for Disease Control and Prevention, fatal overdoses involving opioid analgesics have increased sharply over the past decade. Currently, more than 96,700 people die from drug overdoses every year. Opioids are a factor in 7 out of every 10 overdose deaths. Deaths from drug overdose have surpassed motor vehicle accidents as the leading cause of death for individuals between the ages of 15 and 72.  Clinical data suggest that neuroendocrine dysfunction may be very common in both men and women, potentially causing hypogonadism, erectile dysfunction, infertility,  decreased libido, osteoporosis, and depression. Recent studies linked higher opioid dose to increased opioid-related mortality. Controlled observational studies reported that long-term opioid therapy may be associated with increased risk for cardiovascular events.  Subsequent meta-analysis concluded that the safety of long-term opioid therapy in elderly patients has not been proven.   Side Effects and adverse reactions: Common side effects: Drowsiness (sedation). Dizziness. Nausea and vomiting. Constipation. Physical dependence -- Dependence often manifests with withdrawal symptoms when opioids are discontinued or decreased. Tolerance -- As you take repeated doses of opioids, you require increased medication to experience the same effect of pain relief. Respiratory depression -- This can occur in healthy people, especially with higher doses. However, people with COPD, asthma or other lung conditions may be even more susceptible to fatal respiratory impairment.  Uncommon side effects: An increased sensitivity to feeling pain and extreme response to pain (hyperalgesia). Chronic use of opioids can lead to this. Delayed gastric emptying (the process by which the contents of your stomach are moved into your small intestine). Muscle rigidity. Immune system and hormonal dysfunction. Quick, involuntary muscle jerks (myoclonus). Arrhythmia. Itchy skin (pruritus). Dry mouth (xerostomia).  Long-term side effects: Chronic constipation. Sleep-disordered breathing (SDB). Increased risk of bone fractures. Hypothalamic-pituitary-adrenal dysregulation. Increased risk of overdose.  RISKS: Respiratory depression and death: Opioids increase the risk of respiratory depression and death.  Drug-to-drug interactions: Opioids are relatively contraindicated in combination with benzodiazepines, sleep inducers, and other central nervous system depressants. Other classes of medications (i.e.: certain antibiotics  and even over-the-counter medications) may also trigger or induce respiratory depression in some patients.  Medical conditions: Patients with pre-existing respiratory problems are at higher risk of respiratory failure and/or depression when in combination with opioid analgesics. Opioids are relatively contraindicated in some medical conditions such as central sleep apnea.   Fractures and Falls:  Opioids increase the risk and incidence of falls. This is of particular importance in elderly patients.  Endocrine System:  Long-term administration is associated with endocrine abnormalities (endocrinopathies). (Also known as Opioid-induced Endocrinopathy) Influences on both the hypothalamic-pituitary-adrenal axis?and the hypothalamic-pituitary-gonadal axis have been demonstrated with consequent hypogonadism and adrenal insufficiency in both sexes. Hypogonadism and decreased levels of dehydroepiandrosterone sulfate have been reported in men and women. Endocrine effects include: Amenorrhoea in women (abnormal absence of menstruation) Reduced libido in both sexes Decreased sexual function Erectile dysfunction in men Hypogonadisms (decreased testicular function with shrinkage of testicles) Infertility Depression and fatigue Loss of muscle mass Anxiety Depression Immune suppression Hyperalgesia Weight gain Anemia Osteoporosis Patients (particularly women of childbearing age) should avoid opioids. There is insufficient evidence to recommend routine monitoring of asymptomatic patients taking opioids in the long-term for hormonal deficiencies.  Immune System: Human studies have demonstrated that opioids have an immunomodulating effect. These effects are mediated via opioid receptors both on immune effector cells and in the central nervous system. Opioids have been demonstrated to have adverse effects on antimicrobial response and anti-tumour surveillance. Buprenorphine has been demonstrated to have  no impact on immune function.  Opioid Induced Hyperalgesia: Human studies have demonstrated that prolonged use of opioids can lead to a state of abnormal pain sensitivity, sometimes called opioid induced hyperalgesia (OIH). Opioid induced hyperalgesia is not usually seen in the absence of tolerance to opioid analgesia. Clinically, hyperalgesia may be diagnosed if the patient on long-term opioid therapy presents with increased pain. This might be qualitatively and anatomically distinct from pain related to disease progression or to breakthrough pain resulting from development of opioid tolerance. Pain associated with hyperalgesia tends to be more diffuse than the pre-existing pain and less defined in quality. Management of opioid induced hyperalgesia requires opioid dose reduction.  Cancer: Chronic opioid therapy has been associated with an increased  risk of cancer among noncancer patients with chronic pain. This association was more evident in chronic strong opioid users. Chronic opioid consumption causes significant pathological changes in the small intestine and colon. Epidemiological studies have found that there is a link between opium  dependence and initiation of gastrointestinal cancers. Cancer is the second leading cause of death after cardiovascular disease. Chronic use of opioids can cause multiple conditions such as GERD, immunosuppression and renal damage as well as carcinogenic effects, which are associated with the incidence of cancers.   Mortality: Long-term opioid use has been associated with increased mortality among patients with chronic non-cancer pain (CNCP).  Prescription of long-acting opioids for chronic noncancer pain was associated with a significantly increased risk of all-cause mortality, including deaths from causes other than overdose.  Reference: Von Korff M, Kolodny A, Deyo RA, Chou R. Long-term opioid therapy reconsidered. Ann Intern Med. 2011 Sep 6;155(5):325-8. doi:  10.7326/[id number]. PMID: 78106373; PMCID: [id number]. Kit JINNY Laurence CINDERELLA Pearley JINNY, Hayward RA, Dunn KM, Jordan KP. Risk of adverse events in patients prescribed long-term opioids: A cohort study in the [place] Clinical Practice Research [employer]. Eur J Pain. 2019 May;23(5):908-922. doi: 10.1002/ejp.1357. Epub 2019 [name] 31. PMID: 69379883. Colameco S, Coren JS, Ciervo CA. Continuous opioid treatment for chronic noncancer pain: a time for moderation in prescribing. Postgrad Med. 2009 Jul;121(4):61-6. doi: 10.3810/pgm.[id number]. PMID: 80358728. Gigi JONELLE Shlomo MILUS Levern IVER Conny RN, Di Giorgio SD, Blazina I, Lonell DASEN, Bougatsos C, Deyo RA. The effectiveness and risks of long-term opioid therapy for chronic pain: a systematic review for a [employer] of [employer] Pathways to [employer]. Ann Intern Med. 2015 Feb 17;162(4):276-86. doi: 10.7326/M14-2559. PMID: 74418742. Rory CHRISTELLA [facility], [place]uc DM. NCHS Data Brief No. 22. [place]: Centers for Disease Control and Prevention; 2009. Sep, Increase in Fatal Poisonings Involving Opioid Analgesics in the [place] , 1999-2006. Song IA, Choi HR, Oh TK. Long-term opioid use and mortality in patients with chronic non-cancer pain: Ten-year follow-up study in [place] from 2010 through 2019. EClinicalMedicine. 2022 Jul 18;51:101558. doi: 10.1016/j.eclinm.[id number]. PMID: 64124182; PMCID: [id number]. Huser, W., Schubert, T., Vogelmann, T. et al. All-cause mortality in patients with long-term opioid therapy compared with non-opioid analgesics for chronic non-cancer pain: a database study. BMC Med 18, 162 (2020). [email or web address].info/ Rashidian H, Zendehdel K, Kamangar F, Malekzadeh R, Haghdoost AA. An Ecological Study of the Association between Opiate Use and Incidence of Cancers. Addict Health. 2016 Fall;8(4):252-260. PMID: 71180443; PMCID: [id number].  Our Goal: Our goal is to control your pain with means other  than the use of opioid pain medications.  Our Recommendation: Talk to your physician about coming off of these medications. We can assist you with the tapering down and stopping these medicines. Based on the new information, even if you cannot completely stop the medication, a decrease in the dose may be associated with a lesser risk. Ask for other means of controlling the pain. Decrease or eliminate those factors that significantly contribute to your pain such as smoking, obesity, and a diet heavily tilted towards inflammatory nutrients.  Last Updated: 11/03/2022   ______________________________________________________________________       ______________________________________________________________________    Update on Controlled Substance (Opioid) Regulations   To: All patients taking opioid pain medications. (I.e.: hydrocodone, hydromorphone , oxycodone , oxymorphone, morphine , codeine, methadone, tapentadol, tramadol, buprenorphine, fentanyl , etc.)  Re: Review on the state of controlled substance regulations.  Introduction: Rules and regulations associated with all aspects of controlled substances are constantly being modified. Unfortunately we have encountered patients questioning the veracity of the  information that we provide them about these changes. This is intended to provide them with appropriate references and a historical review of these changes.  A Brief History: As of February 11, 2016, the [place]  Government declared the opioid epidemic a public health emergency. Prescription drug monitoring programs (PDMPs) and the [facility] All Schedules Prescription Electronic Reporting Act (NASPER). Before 1800, clinicians regarded pain as an existential phenomenon, a consequence of aging. There was no regulation on the use of cocaine and opioids, resulting in widespread marketing and prescribing for many ailments ranging from diarrhea to toothache. The [employer] of  [phone], passed in response to the sudden emergence of street heroin abuse as well as iatrogenic morphine  dependence, influenced both physician and patient alike to avoid opiates. Patients with unexplained pain in the 1920s were regarded as deluded, malingering, or abusers, and cancer patients through the 1950s were encouraged to wean themselves off opioids until their lives could be measured in weeks. Alongside this opioid evolution, the American Pain Society launched their influential pain as the fifth vital sign campaign in 1995. Concurrently, pharmaceutical companies introduced new formulations, such as extended release oxycodone  (OxyContin ). From 1997 to 2002, OxyContin  prescriptions increased from 670,000 to 6.2 million. However, concerns soon began to surface regarding overzealous opioid treatment. It must be noted that pharmaceutical companies contributed significantly to the rise of the opioid epidemic, receiving considerable reprimands as a consequence. In 2007, as the opioid epidemic began to inflict profound damage, [employer] pleaded guilty to federal charges related to the misbranding of OxyContin . [employer] agreed to pay a total of $634.5 million to resolve Justice Department investigations, as well as a $19.5 million settlement to [address] and the [address] of [place].  In response to the current epidemic, changes in focus to the development of new abuse deterrent opioid formulations at the [place]  Food and Drug Administration (FDA) as well as drafting of new public standards for pain treatment were created at [facility] in 2017. In response to the opioid epidemic, FDA public policy changes were announced in February 2016. Among these new positions were a re-examination of the risk-benefit paradigm for opioids with strict emphasis on the large public health ramifications. The various modified opioids released over the past 20 years, such as tamper-resistant preparation, have had differing levels of success,  and are collectively referred to as Risk Evaluation and Mitigation Strategies (REMS). There is also a growing focus on preventing opioid use disorder (OUD) and on offering affected individuals accessible and effective treatment. [place]  government policy reflects these changes and both the Affordable Care Act and the Mental Health Parity and Addiction Equity Act were major steps forward in treating opioid addiction. The Affordable Care Act, which was signed into law in 2010, with major provisions coming into effect by 2014.  In the 1990s, the intensified marketing of newly reformulated prescription opioid medications (e.g., OxyContin ) and an influential pain advocacy campaign that encouraged greater pain management led to a precipitous rise in opioid use in the [place] . Research from the Centers for Disease Control and Prevention (CDC) shows that prescription opioid sales in the [place]  quadrupled from 1999 to 2010. At the same time, opioid misuse and opioid-involved overdose deaths increased (Figure 1). Between 1999 and 2010, the rate of opioidinvolved overdose deaths in the [place]  doubled from 2.9 to 6.8 deaths per 100,000 people. This initial rise in opioid-related deaths is often referred to as the first wave of the recent opioid crisis.  Between 1999 and 2020,  565,000 Americans died of opioid-involved overdoses. In turn, federal, state, and local governments responded with various legal and policy efforts to curb opioid misuse and drug-related overdose Deaths.  Recent Congresses have enacted several laws addressing the opioid crisis, such as the Comprehensive Addiction and Recovery Act of 2016 (CARA, P.L. [phone]); the 21st Century Cures Act (P.L. 114-255); the Substance UseDisorder Prevention that Promotes Opioid Recovery and Treatment for Patients and Communities Act (SUPPORT Act, P.L. [phone]); the Fentanyl  Sanctions Act (Title LXXII of P.L. [id number]); and the Blocking  Deadly Fentanyl  Imports Act (P.L. 117-81, 6610). These laws addressed overprescribing and misuse of opioids, expanded substance use disorder prevention and treatment capacities, bolstered drug diversion capabilities, and enhanced international drug interdiction, counternarcotics cooperation, and sanctions efforts. Congress also directed additional funds to many of these initiatives through appropriations.  Congress provided funding in the [employer] Act of [phone]; P.L. 117-2) for syringe services programs (often known as needle exchange programs) and other harm reduction initiatives. Federal and state harm reduction strategies have frequently involved the distribution of naloxone (e.g., Narcan)--a medication used to reverse an opioid overdose--and test strips used to detect fentanyl  in drug samples.  The Department of Justice (DOJ) and Department of Homeland Security [facility]) aim to reduce the diversion of prescription opioids and the use, manufacturing, and trafficking of illicit opioids. DOJ--via the Drug Enforcement Administration (DEA)--regulates opioid manufacturers, distributors, and dispensers; it also controls the opioid supply through enforcement of regulatory requirements.  A History of Opiate Laws in the [place]   Prior to 1890, laws concerning opiates were strictly imposed on a local city or state-by-state basis. One of the first was in [place] in 1875 where it became illegal to smoke opium  only in opium  dens. It did not ban the sale, import or use otherwise. In the next 25 years different states enacted opium  laws ranging from outlawing opium  dens altogether to making possession of opium , morphine  and heroin without a physicians prescription illegal.  The first Congressional Act took place in 1890 that levied taxes on morphine  and opium . From that time on the [employer] has had a series of laws and acts directly aimed at opiate use, abuse  and control. These are outlined below:  1906 - Pure Food and Drug Act Preventing the manufacture, sale, or transportation of adulterated or misbranded or poisonous or deleterious foods, drugs, medicines, and liquors, and for regulating traffic therein, and for other purposes. Punishment included fines and prison time.  1909   - Smoking Opium  Exclusion Act Banned the importation, possession and use of smoking opium . Did not regulate opium -based medications. First Freight forwarder banning the non-medical use of a substance.  1914  - The Margrette Act In summary, The Margrette Act of 1914 was written more to have all parties involved in importing, exporting, set designer and distributing opium  or cocaine to register with the [employer] and have taxes levied upon them. Exempt from the law were physicians operating in the course of his professional practice  1919 - Supreme Court ratified the Bj's in [place] et [place]., v. [place]  and [place]  v. [place], then again in [facility] v. [place] , in 1920, holding that doctors may not prescribe maintenance supplies of narcotics to people addicted to narcotics. However, it does not prohibit doctors from prescribing narcotics to wean a patient off of the drug. It was also the opinion of the court that prescribing narcotics to habitual users was not considered professional practice hence  it then was considered illegal for doctors to prescribe opioids for the purposes of maintaining an addiction. It can be argued that todays addiction medications are not intended to maintain an addiction but to facilitate addiction remission. In which case, this opinion of the court should not preclude practitioners from prescribing buprenorphine or methadone to patients suffering from an addictive disorder.  1924  - Heroin Act Architectural technologist, importation and possession of heroin illegal - even for medicinal use.  1922 -- Narcotic  Drug Import and Export Act Enacted to assure proper control of importation, sale, possession, production and consumption of narcotics.  1927  -- Special Educational Needs Teacher of [employer] of Prohibition was responsible for tracking bootleggers and organized conservation officer, historic buildings. They focused primarily on interstate and international cases and those cases where local law enforcement official would not or could not act.  1932 -- Uniform State Narcotic Act Encouraged states to pass uniform state laws matching the federal Narcotic Drug Import and Export Act. Suggested prohibiting cannabis use at the state level.  27 -- Food, Drug, and Cosmetic Act The new law brought cosmetics and medical devices under control, and it required that drugs be labeled with adequate directions for safe use. Moreover, it mandated pre-market approval of all new drugs, such that a manufacturer would have to prove to FDA that a drug were safe before it could be sold  1951 -- Boggs Act Imposed maximum criminal penalties for violations of the import/export and internal revenue laws related to drugs and also established mandatory minimum prison sentences.  1956 -- Narcotics Control Act Increased Boggs Act penalties and mandatory prison sentence minimums for violations of existing drug laws.  1965 -- Drug Abuse Control Amendment Enacted to deal with problems caused by abuse of depressants, stimulants and hallucinogens. Restricted research into psychoactive drugs such as LSD by requiring FDA approval.  1970 -- Controlled Substance Act  Controlled Substances Import and Export Act These laws are a consolidation of numerous laws regulating the manufacture and distribution of narcotics, stimulants, depressants, hallucinogens, anabolic steroids, and chemicals used in the illicit production of controlled substances. The CSA places all substances that are regulated under existing federal law into one of five schedules. This placement is based upon  the substance's medicinal value, harmfulness, and potential for abuse or addiction. Schedule I is reserved for the most dangerous drugs that have no recognized medical use, while Schedule V is the classification used for the least dangerous drugs. The act also provides a mechanism for substances to be controlled, added to a schedule, decontrolled, removed from control, rescheduled, or transferred from one schedule to another.  49 - Drug Enforcement Agency By Executive Order, the DEA was formed to take place of the [employer] of Narcotics and Dangerous Drugs.  72 -Narcotic Addict Treatment Act of  1974  - Public Law [phone] Amends the Controlled Substance Act of 1970 to provide for the registration of practitioners conducting narcotic treatment programs. [methadone clinics] It also provides legal definitions for the phrases maintenance treatment and detoxification treatment.  1986 -- Anti-Drug Abuse Act of 1986 Strengthened Federal efforts to encourage foreign cooperation in eradicating illicit drug crops and in halting international drug traffic, to improve enforcement of Federal drug laws and enhance interdiction of illicit drug shipments, to provide strong Market researcher in establishing effective drug abuse prevention and education programs, to [employer] support for drug abuse treatment and rehabilitation efforts, and for other purposes. It also re-imposed mandatory sentencing minimums depending on which drug and  how much was involved.  1988 -- Anti-Drug Abuse Act of 1988 Established the Office of Materials Engineer (ONDCP) in the [employer] of the Economist; authorized funds for [employer], state and local drug enforcement activities, school-based drug prevention efforts, and drug abuse treatment with special emphasis on injecting drug abusers at high risk for AIDS.  2000 -- Federal - The Drug Addiction Treatment Act of 2000 (DATA 2000) It enables qualified physicians to  prescribe and/or dispense narcotics for the purpose of treating opioid dependency. For the first time, physicians are able to treat this disease from their private offices or other clinical settings. This presents a very desirable treatment option for those who are unwilling or unable to seek help in drug treatment clinics. Patients can now be treated in the privacy of their doctors office, as are other people being treated for any other type of medical condition. One medicine doctors may now prescribe is Buprenorphine. The major downfall of this Act is the limitation of 30 patients per practice - which means that large facilities, no matter how many physicians are there, can only treat 30 patients at a time.  2002-- DEA reschedules buprenorphine from a schedule V drug to a schedule III drug, on February 01, 2001 - the day before the FDA approval of Suboxone and Subutex despite overwhelming objection by the medical community.  2004: June 2004 THE CONFIDENTIALITY OF ALCOHOL AND DRUG ABUSE PATIENT RECORDS REGULATION AND THE HIPAA PRIVACY RULE:  Confidentiality of Alcohol and Drug Dependence Patient Records (summary) Code of Federal Regulations Title 42 Part 2 (42 CFR Part 2)  The confidentiality of alcohol and drug dependence patient records maintained by this practice/program is protected by federal law and regulations. Generally, the practice/program may not say to a person outside the practice/program that a patient attends the practice/program, or disclose any information identifying a patient as being alcohol or drug dependent unless:  The patient consents in writing; The disclosure is allowed by a court order, or The disclosure is made to medical personnel in a medical emergency or to qualified personnel for research,  audit, or practice/program evaluation. Violation of the federal law and regulations by a practice/program is a crime. Suspected violations may be reported to appropriate authorities  in accordance with federal regulations. Freight forwarder and regulations do not protect any information about a crime committed by a patient either at the practice/program or against any person who works for the practice/program or about any threat to commit such a crime. Federal laws and regulations do not protect any information about suspected child abuse or neglect from being reported under state law to appropriate state or local authorities.  sample consent form (MS-WORD)  2005: 11-28-2003 Public law [phone], Amends the Controlled Substances Act to eliminate the 30-patient limit for medical group practices allowed to dispense narcotic drugs in schedules III, IV, or V for maintenance or detoxification treatment (retains the 30-patient limit for an individual physician). This amendment removes the 30-patient limit on group medical practices that treat opioid dependence with buprenorphine. The restriction was part of the original Drug Addiction Treatment Act of 2000 (DATA) that allowed treatment of opioid dependence in a doctor's office. With this change, every certified doctor may now prescribe buprenorphine up to his or her individual physician limit of 30 patients.  2006: On 04/25/2005 President Levy signed Bill H.R.6344 into law. This allows physicians who have been certified to prescribe certain drugs for the treatment of opioid dependence under DATA2000 to treat up  to 100 patients (up from 30) by submitting an intent notification to the Dept of Health and [employer]. This is a major step forward in both fighting the stigma and allowing access to treatment previously not available to some. For more details see 30/100-PATIENT LIMIT  2016: HHS augments regulations concerning the 30/100 patient limit by raising the limit to 275 for qualifying physicians. Link to summary of regulation  2016: Comprehensive Addiction and Recovery Act of 2016 (sec.303) amends the Controlled Substance Act - to allow Nurse  Practitioners and Physician Assistants to become eligible to prescribe buprenorphine for the treatment of opioid use disorder. See the entire law for more details.  The roots of the concurrent regulation of certain drugs under two statutory schemes go back to the beginning of this century. In 1906, Congress enacted the Pure Food and Drug Act, establishing one regime of regulation to assure (among other things) that drugs were not adulterated or misbranded. These regulations were amended several times, recodified in 1938, and expanded on again from the 1940s through the 1990s. Their implementation and enforcement is today assigned to the Food and Drug Administration (FDA) in the Department of Health and Human Services [facility]).  In 1914, Congress adopted the [place] Narcotic Act to stop abuse of addictive drugs. The [place] Narcotic Act was amended in 1937 to include marijuana. In 1965, amphetamines, barbiturates, and hallucinogens came under regulation, but under the [employer], Drug, and Cosmetic Act. In 1970, these various statutes were consolidated and recodified as the Controlled Substances Act (CSA), which has been amended several times since then. Its implementation and enforcement is today assigned to the Drug Enforcement Administration (DEA) in the Department of Justice.  The first clash occurred after World War I, when so-called morphine  clinics existed and physicians prescribed or dispensed morphine  to addicts. Some addicts were veterans of the American Civil War, the Spanish-American War, and WWI, who had become addicted during treatment for war wounds, but most of them came from the growing population of nonmedical addicts ([place], 8017). The Narcotics Division of the Prohibition Unit of the Department of the Treasury, which was then responsible for enforcing the [facility] Narcotic Act, concluded that this activity was not the legitimate practice of medicine but simple drug trafficking. The  Treasury Department swiftly closed the clinics and made it personally and professionally risky for physicians to maintain a narcotic addict for any reason. In did so, however, only after the American Medical Association had adopted a resolution, in 1920, opposing ambulatory clinics''.  In 1972, the public health establishment, including the Secretary of Health, Education, and Welfare, the Education Officer, Environmental, the [employer] of [employer], and the Chemical Engineer for Drug Abuse Prevention, was unprepared to allow [employer] of Narcotics and Dangerous Drugs, DEA's predecessor agency, to unilaterally define the parameters of medical practice for the use of methadone in the treatment of heroin addiction. As a consequence, a new set of rules--the third, on top of the FDA and DEA schemes--was added, one that inserted FDA deeply into the practice of medicine, notwithstanding its protestations to the contrary. Congress ratified this joint responsibility of law enforcement and public health officials for methadone through this third set of rules in 1974 with the passage of the Narcotic Addict Treatment Act (NATA). To examine in detail the evolution of this third set of rules--commonly referred to as the FDA or DHHS methadone regulations--we turn, first, to the period of the mid-1960s.  Increased use of heroin in  the post World War II period first became apparent in the early to mid 1950s. During the [employer], a minimum mandatory narcotics law was enacted in 1956, effective July 1957. 1962 [facility] conference on drug abuse, the [employer] on Narcotic and Drug Abuse (the [employer]) of 1963, the Drug Abuse Control Amendments of 1965, the President's Commission on [employer] and Administration of Justice (the [employer]) of [phone], and the Narcotic Addiction Rehabilitation Act of 1966.  The 1965 Drug Abuse Control Amendments  brought under strict federal control all nonnarcotic drugs capable of producing serious psychotoxic effects when abused. This act also created the [employer] of Drug Abuse Control within the Department of Health, Education, and Welfare (DHEW) and shifted the basis for aon corporation of illegal drugs from tax principles (administered by the Department of Treasury) to the regulation of commerce (administered by the [employer]).  The 1966 Narcotic Addiction Rehabilitation Act TOUR MANAGER) authorized the civil commitment of narcotic addicts, and federal assistance to state and local governments to develop a local system of drug treatment programs. With respect to the latter, the [employer] of Mental Health [facility]) initially proposed the gradual implementation of the state assistance effort, mainly through a common mental health mechanism--inpatient treatment programs. However, because of a perceived pressing need, the courts began to commit addicts to these programs even before they were officially opened or staffed. The NARA legislation imposed the following contract requirements on treatment centers: (1) thrice-a-week counseling sessions; (2) weekly urine tests; (3) restorative dental services; (4) psychological consultations and vocational training; and (5) the treatment modalities of drug-free outpatient, therapeutic community, and methadone maintenance. Reorganization Plan No. 1 of 1968 transferred the primary functions of the [employer] of Narcotics (FBN) from the [employer] to the Department of Justice; it also transferred the [employer] of Drug Abuse Control functions to the Department of Justice. Within the [employer], the [employer] of Narcotics and Dangerous Drugs (BNDD) was created, which became the Drug Enforcement Administration in 1973.   Under the first [place] administration [phone]), federal drug abuse policy developed in a significant way. These developments included a 1969 war  on drugs presidential message, resulting legislation in 1970, and a Special Action Office created by executive order in 1971 and authorized in statute in 1972. Brynn, in 1969, to send a message to Congress on drug abuse. Although this was the first time that a U.S. president invoked the war on drugs image, it was in retrospect the most balanced approach to the problem of drug abuse that had been advanced. The 1969 message resulted in the submission of legislation to the Congress and the passage, the following year, of the Comprehensive Drug Abuse Prevention and Control Act of 1970 [employer] [phone], February 21, 1969). The act dealt with research, treatment, and prevention of drug abuse and drug dependence, and with drug abuse charity fundraiser. One major purpose of the 1970 legislation was to reverse some of the strictures of the [employer] of 1914. The 1970 act sought to clarify for the medical profession . . . the extent to which they may safely go in treating narcotic addicts as patients. Title I, in Section IV, charged the Surveyor, Minerals, Education, and Welfare, to determine the appropriate methods of professional practice in the medical treatment of the narcotic addiction of various classes of narcotic addicts. This provision constitutes the initial statutory basis for treatment standards. The law enforcement sections consolidated all prior federal statutes into  the Controlled Substances Act and the Controlled Substances Export and Import Act (Titles II and III, respectively, of the Comprehensive Drug Abuse Prevention and Control Act of 1970). Under this legislation, substances were classified under five schedules according to their abuse potential, and psychological and physical effects. Methadone was placed in Schedule II, along with such opiate drugs as morphine , codeine, and hydrocodone.  One of the most important steps taken by President Brynn was to establish in June 1971 the  Special Action Office for Drug Abuse Prevention (SAODAP) in the [employer] of the President ([place] [phone], October 12, 1969). In mid-1971, [facility] appointed Dr. Maple Dunnings as SAODAP director. Within a year, the Drug Abuse Prevention Office and Treatment Act of 1972 [employer] [phone], July 17, 1970) gave statutory authority to [facility], but limiting setting, on October 25, 1973, as the limit on its existence.  The purpose of the 1972 act was to bring the resources of the federal government to bear on drug abuse with the immediate objective of significantly reducing its incidence and developing a comprehensive, coordinated long-term federal strategy to combat drug abuse.  Narcotic Addict Treatment Act [employer]) of 1974 [employer] [phone]), which amended the Controlled Substances Act. This legislation was driven by concern for the diversion of methadone to illicit channels that was occurring in 1972 and 1973, as reflected in the title of the Senate bill adopted on October 04, 1971, the Methadone Diversion Control Act of 1973. (U.S. Senate, 1970a, 8029a).  The 1980 final rule (45 FR 37305, January 15, 1979) reduced the minimum standard for admission from two years of addiction to one year coupled with a clinical determination that the individual was currently physiologically.  The regulations were next revised in 1989, following two proposals to modify them, one in 1983 and one in 1987.  Under President Tanda Corrente, a government-wide effort was made to review all federal government regulations and to eliminate or reduce the burden of these regulations on the private sector, state and nash-finch company, and wps resources.   The 1983 recommendations, though not adopted, did initiate another revision of the methadone regulations, which first found expression in a 1987 proposed rule (52 FR 37047, January 27, 1986) and culminated in a final rule (54 FR 8954, June 28, 1987) at the end of the  decade. In the 1987 proposed rule, the FDA and NIDA, in an effort to put the best face on the unenthusiastic 1983 response by the provider community to converting the regulations to guidelines, indicated that they had retained the current requirements necessary to achieve the goals of the 1974 NATA, but were proposing to streamline the regulation and to promote more efficient operation of methadone programs. The 1987 proposed rule, issued by the FDA and NIDA, advanced the following changes in the methadone regulation: that detoxification treatment be divided into short-term (<21 days) and long-term (>21 and <180 days) treatment; that the minimum staffing ratio of one counselor to 50 patients be eliminated; that blood tests be allowed as ways to conduct initial drug screening or to meet the monthly testing requirements for six-day take-home patients; that the 72-hour notification of FDA and the pertinent state authority for methadone doses greater than 100 mg be eliminated; that special adverse reaction reporting requirements for methadone be eliminated and reliance placed upon general FDA reporting requirements; that a supervising counselor be allowed to conduct the annual review of the patient's treatment plan for certain qualified patients who had been in treatment for 3  years or longer; and that the requirement of an annual report of methadone treatment programs to the FDA be dropped. The FDA and NIDA issued a final rule on June 28, 1987, based on comments on the 1987 proposed (54 FR 8954). Concurrently, FDA and NIDA issued a six-page guidance document, which noted that the regulations, over time, had recommended certain practices that were not actually required. Public Health Service, in Congress, and elsewhere, to reorganize the Alcohol, Drug Abuse, and Mental Health Administration (ADAMHA). These efforts culminated in the [employer] of 1992 [employer] [phone], November 05, 1990), the main  purpose of which was to transfer the research portions of the three ADAMHA institutes--NIDA, the [employer] Mills of Alcoholism and Alcohol Abuse, and the [employer] of Mental Health--to the [employer] and to create the Substance Abuse and Museum/gallery Exhibitions Officer [facility]) as the home for the service functions of these entitles.  Guidelines for Opioid Treatment The Federal Guidelines for Opioid Treatment Programs - 2015 serve as a guide to accrediting organizations for developing accreditation standards. The guidelines also provide OTPs with information on how programs can achieve and maintain compliance with federal regulations. The 2015 guidelines are an update to the 2007 Guidelines for the Accreditation of Opioid Treatment Programs (PDF  547 KB). The new document reflects the obligation of OTPs to deliver care consistent with the patient-centered, integrated, and recovery-oriented standards of substance use treatment.  DPT oversees the certification of OTPs and provides guidance to nonprofit organizations and state governmental entities that want to become a SAMHSA-approved accrediting body. Learn more about the accreditation and certification of OTPs and [facility] oversight of OTP accreditation bodies.  Model Guidelines for [employer] With input from [facility], the Federation of [employer] in 2013 adopted a revised version of the federations office-based opioid treatment policies. The Model Policy on DATA 2000 and Treatment of Opioid Addiction in the Medical Office - 2013 (PDF  279 KB) provides model guidelines for use by state medical boards in regulating office-based opioid treatment.  Holiday Guidance for Opioid Treatment Programs (PDF  203 KB) In response to requests for the upcoming federal holidays and ensuing weekends (December 24th, 25th, and 26th and December 31st, [name] 1st, and [name] 2nd), this letter is to provide guidance  regarding requests for unsupervised doses of medication for patients for these dates. View a sample [id number] (PDF  194 KB).  Federal regulation of drugs emerged as early as 8, under a law that addressed only imported drugs. In 1905 the [employer] launched a private, voluntary means of controlling a substantial part of the drug marketplace, a system that remained in place for over a half-century. Drug regulation in FDA has evolved considerably since President Ricardo Para signed the 1906 Pure Food and Drugs Act.  1820 Eleven doctors set up the U.S. Pharmacopeia and record the first list of standard drugs. 1848 Drug Importation Act passed by Congress requires U.S. Customs Service inspection to stop entry of tainted, low quality drugs from overseas. 8116 Dr. Mitchell MICAEL Burrs becomes the chief chemist at the [facility] of [employer] adulteration studies.  1905 The American Medical Association [facility]) begins a voluntary program of drug approval that would last until 1955. In order to advertise in the [facility] and related journals, drug companies must show proof that the drug will treat what they claim. 1906 The original Food and Drug Act is passed by Congress on June 30 and signed by Angus Ricardo  Roosevelt. The Act outlaws states from buying and selling food, drinks, and drugs that have been mislabeled and tainted. 1911 In U.S. v. Vicci, the [employer] that the [employer] and Drugs Act does not outlaw false medical claims but only false and misleading statements about the ingredients or identity of a drug. 1912 Congress passes the Gaston Amendment to overcome the ruling in U.S. v. Vicci. The Act outlaws labeling medicines with fake medical claims that is meant to trick the buyer. 1930 The name of the Food, Drug, and Insecticide Administration is shortened to Food and Drug Administration (FDA) under an therapist, music. 1933 FDA  recommends a total rewrite of the out-of-date 1906 Food and Drugs Act.   1937 Elixir Sulfanilamide, contain the poisonous liquid, diethylene glycol, kills 107 persons, many of whom are children, dramatizing the need to establish drug safety before marketing and to pass the pending food and drug law. 1938 Congress passes [employer], Drug, and Cosmetic (FDC) Act of 1938, which requires that new drugs show safety before selling. This starts a new system of drug regulation. The Act also requires that safe limits be set for unavoidable poisonous matter and allows for factory inspections. The Directv is given power to oversee advertising for all FDAregulated products except prescription drugs. FDA states that sulfanilamide and other dangerous drugs must be given under the direction of a medical expert. This begins the requirement for prescription only (nonnarcotic) drugs (see 1951 [place] amendment). 1941 Nearly 300 deaths and injuries result from the use of sulfathiazole tablets, an antibiotic, tainted with the sedative, phenobarbital. In response, FDA drastically changes manufacturing and quality controls. These changes lead to the development of good manufacturing practices (GMPs). 1948 The [employer] in U.S. v. Floretta that FDA jurisdiction extends to retail stores, thereby allowing FDA to stop illegal sales of drugs by pharmacies including barbiturates and amphetamines. 1950 In Walgreen. v. U.S., a U.S. Court of Appeals rules that the directions for use on a drug label must include the drugs purpose. 1951 Congress passes the [place]Humphrey Amendment, which defines the kinds of drugs that cannot be used safely without medical supervision. The amendment limits sale of these drugs to prescription only by a medical professional. All other drugs are to be available without a prescription. 1952 A nationwide investigation by FDA  reveals that chloramphenicol, an antibiotic, caused nearly 180 cases of often deadly blood diseases. Two years later FDA engages the [employer] of Hospital Pharmacists, the American Association of Medical Record Librarians, and later the American Medical Association in a voluntary program of drug reaction reporting. 1953 The [employer] Amendment clarifies previous law and requires FDA to give manufacturers written reports of conditions seen during inspections and results of factory samples. 1962 Thalidomide, a new sleeping pill, causes severe birth defects of the arms and legs in thousands of babies born in [place]. The U.S. media reports on how Dr. Cathlean Mort, a FDA medical officer, helped prevent approval and marketing of Thalidomide in the [place] . These reports stirred up public support for stronger drug laws. 3 Congress passes the [employer]. For the first time, these laws require drug makers to prove their drug works before FDA can approve them for sale. The Advisory Committee on Investigational Drugs meets for the first time. This was the first meeting of a committee to advise FDA on product approval and policy on an ongoing basis. 1966 FDA contracts with the [employer]  Academy of Sciences/National Research Council to measure the effectiveness of 4,000 marketed drugs approved on the basis of safety alone between [phone] and 05/15/60. The Fair Packaging and Labeling Act requires all consumer products, in interstate commerce, to be honestly and informatively labeled. 1966/05/15 FDA forms the Drug Efficacy Study Implementation (DESI) to carry out recommendations of the [employer] of the effectiveness of drugs first sold between [place] and 1962/01/18January 18, 1970 FDA requires the first patient package insert, medicines must come with information for the patient about risks and benefits. 1972 Over-the-Counter Drug Review begins  to enhance the safety, effectiveness and appropriate labeling of drugs sold without prescription. 1973 The U.S. Supreme Court upholds the [place] drug effectiveness law and approves FDAs action to control entire classes of products. 1982 FDA issues Tamper-resistant Packaging Regulations to prevent poisonings such as deaths from cyanide placed in Tylenol  capsules. Congress passes the [employer] in 1981-05-15, making it a crime to tamper with packaged consumer products. May 15, 1982 Drug Price Advertising Account Planner Act (Hatch-Waxman Act) increases the availability of less costly generic drugs by allowing FDA to approve applications for generic versions of brand-name drugs without repeating the research that proved the safety and effectiveness of the brand-name drugs. The Act also allowed brand-name companies to apply for up to five years additional patent protection for the new medicines they developed to make up for time lost while their products were going through FDA's approval process. 1989 The FDA issued guidelines asking drug makers to decide if a drug is likely to have usefulness in elderly people and to include elderly people in studies when applicable. 1991 In 05/16/1979, the FDA and the Department of Health and Human Services published a policy on protecting people in research. In May 15, 1989, this policy is adopted by more than a dozen federal agencies involved in human subject research and becomes known as the Common Rule. 4 1993 FDA launches MedWatch, a system designed to collect reports from health professionals on problems with drugs and other medical products. FDA issues guidelines for measuring gender differences in responses to medication. Drug companies are encouraged to include patients of both sexes in their research of drugs and to study any gender-specific effects. 1995 FDA declares cigarettes to be drug delivery devices. Limits are issued on marketing and  sales to reduce smoking by young people. 1998 FDA introduces the Adverse Event Reporting System (AERS), a computerized database designed to store and study safety reports on already marketed drugs.  The Demographic Rule requires that a marketing application review data on safety and effectiveness by age, gender, and race. The Pediatric Rule requires drug makers of selected new and existing drugs to conduct studies on drug safety and effectiveness in children. 1999 Creation of the Drug Facts Label for OTC drug products. The law requires all overthe-counter drug labels to have information in a standard format. These drug facts labels are designed to give the user easy-to-find information. 2000 The U. S. [employer], upholds an earlier decision from The [employer] and Drug Administration v. [employer]. et al. and rules 5-4 that FDA does not have authority to regulate tobacco as a drug. May 15, 2000 The [employer] Pharmaceuticals for Children Act, in exchange for studying the drug in children, the drug maker gets six months of selling their product without competition. 05-15-01 The Pediatric Research Equity Act gives FDA the right to ask drug companies to study the effectiveness of new drugs in children. 2002-05-15 FDA advises medical professionals  to limit the use of a pain reliever called Cox-2, a nonsteroidal anti-inflammatory drug (NSAIDs). Studies had shown that long-term use raised chances of heart attacks and strokes. The warning is also added to the over-thecounter NSAIDs Drug Facts label. Medicines used in hospitals must have a bar code to prevent patients from receiving the wrong medicine. 5 2005 The Drug Safety Board is formed, consisting of FDA staff and representatives from the [employer] of [address] and the [employer]. The Board advises the Director, Center for Drug Evaluation and Research, FDA, on drug safety issues and works with the agency in sharing safety  information to health professionals and patients.  The [place]  Food and Drug Administration (FDA) was first created to enforce the Pure Food and Drug Act of 1906. In this capacity, the FDA is charged with protecting the health of the [place]  public, to ensure the quality of its food, medicine, and cosmetics. Before this time, the [place]  government had no formal oversight of these products and left issues of quality and purity to the individual manufactures, or at times, individual states.    Review: [place] Stop ACT. (The Strengthen Opioid Misuse Prevention (STOP) Act of 2017). GENERAL ASSEMBLY OF [place]  SESSION 2017 SESSION LAW 2017-74 HOUSE BILL 243  PMP mandatory The dispenser shall report: (1) The dispenser's DEA number. (2) The name of the patient for whom the controlled substance is being dispensed, and the patient's: a. Full address, including city, state, and zip code, b. Telephone number, and c. Date of birth. (3) The date the prescription was written. (4) The date the prescription was filled. (5) The prescription number. (6) Whether the prescription is new or a refill. (7) Metric quantity of the dispensed drug. (8) Estimated days of supply of dispensed drug, if provided to the dispenser. (9) National Drug Code of dispensed drug. (10) Prescriber's DEA number. (11) Method of payment for the prescription.  No paper prescriptions  Duration of scripts Acute vs Chronic prescribing  2016 CDC Guidelines for prescribing Opioids for Chronic Pain. (Updated in 2022.) Medical Board  Laws:  Prescription Laws Drug laws, rules, and regulations are constantly changing. Any attempt to summarize them would quickly become outdated. Because of that, the Board encourages practitioners who seek guidance on prescribing procedures to refer to the sources listed below in addition to the Boards position statements, rules and Medical Practice Act.  [place]  Board of Pharmacy  (NCBOP) (which offers the states pharmacy laws and rules, and links to the Code of Federal Regulations) [employer] Site: www.ncbop.org  [place]  General Statutes General Web Site: politicalpool.cz See: [place]  Food, Drug, and Cosmetic Act: [id number] & 106-134 See: [place]  Pharmacy Practice Act, Article 4A: [phone] See: [place]  Controlled Substances Act, Article 5: 90-86 & 90-113.8 See: Use of controlled substances to render one mentally incapacitated or physically helpless: [employer] [facility]. Code, Title 21, Food & Drugs www.deadiversion.usdoj.gov Controlled Substances Schedules www.deadiversion.usdoj.gov Drug Warehouse Manager - www.deadiversion.usdoj.gov 42 CFR  8.12 - Federal opioid treatment standards.   Effective December 23, 2015, prior approval will be required for opioid analgesic doses for [facility]. Medicaid and [place] [facility]) beneficiaries which:  Exceed 120 mg of morphine  equivalents (MME) per day  Are greater than a 14-day supply of any opioid, or,  Are non-preferred opioid products on the [place] Medicaid Preferred Drug List (PDL)  FEDERAL 42 CFR  8.12 - Federal opioid treatment standards. Title II of the Comprehensive Drug  Abuse Prevention and Control Act of 1970, commonly known as the Controlled Substance Act (CSA) Title 21 [place]  Code ([place]) Controlled Substances Act.   Reference:   ______________________________________________________________________       ______________________________________________________________________    Medication Rules  Purpose: To inform patients, and their family members, of our medication rules and regulations.  Applies to: All patients receiving prescriptions from our practice (written or electronic).  Pharmacy of record: This is the pharmacy where your electronic prescriptions will be sent. Make sure we have the correct one.  Electronic  prescriptions: In compliance with the [place]  Strengthen Opioid Misuse Prevention (STOP) Act of 2017 (Session Law 2017-74/H243), effective April 28, 2018, all controlled substances must be electronically prescribed. Written prescriptions, faxing, or calling prescriptions to a pharmacy will no longer be done.  Prescription refills: These will be provided only during in-person appointments. No medications will be renewed without a face-to-face evaluation with your provider. Applies to all prescriptions.  NOTE: The following applies primarily to controlled substances (Opioid* Pain Medications).   Type of encounter (visit): For patients receiving controlled substances, face-to-face visits are required. (Not an option and not up to the patient.)  Patient's Responsibilities: Pain Pills: Bring all pain pills to every appointment (except for procedure appointments). Pill counts are required.  Pill Bottles: Bring pills in original pharmacy bottle. Bring bottle, even if empty. Always bring the bottle of the most recent fill.  Medication refills: You are responsible for knowing and keeping track of what medications you are taking and when is it that you will need a refill. The day before your appointment: write a list of all prescriptions that need to be refilled. The day of the appointment: give the list to the admitting nurse. Prescriptions will be written only during appointments. No prescriptions will be written on procedure days. If you forget a medication: it will not be Called in, Faxed, or electronically sent. You will need to get another appointment to get these prescribed. No early refills. Do not call asking to have your prescription filled early. Partial  or short prescriptions: Occasionally your pharmacy may not have enough pills to fill your prescription.  NEVER ACCEPT a partial fill or a prescription that is short of the total amount of pills that you were prescribed.  With  controlled substances the law allows 72 hours for the pharmacy to complete the prescription.  If the prescription is not completed within 72 hours, the pharmacist will require a new prescription to be written. This means that you will be short on your medicine and we WILL NOT send another prescription to complete your original prescription.  Instead, request the pharmacy to send a carrier to a nearby branch to get enough medication to provide you with your full prescription. Prescription Accuracy: You are responsible for carefully inspecting your prescriptions before leaving our office. Have the discharge nurse carefully go over each prescription with you, before taking them home. Make sure that your name is accurately spelled, that your address is correct. Check the name and dose of your medication to make sure it is accurate. Check the number of pills, and the written instructions to make sure they are clear and accurate. Make sure that you are given enough medication to last until your next medication refill appointment. Taking Medication: Take medication as prescribed. When it comes to controlled substances, taking less pills or less frequently than prescribed is permitted and encouraged. Never take more pills than instructed. Never take the medication more frequently  than prescribed.  Inform other Doctors: Always inform, all of your healthcare providers, of all the medications you take. Pain Medication from other Providers: You are not allowed to accept any additional pain medication from any other Doctor or Healthcare provider. There are two exceptions to this rule. (see below) In the event that you require additional pain medication, you are responsible for notifying us , as stated below. Cough Medicine: Often these contain an opioid, such as codeine or hydrocodone. Never accept or take cough medicine containing these opioids if you are already taking an opioid* medication. The combination may cause  respiratory failure and death. Medication Agreement: You are responsible for carefully reading and following our Medication Agreement. This must be signed before receiving any prescriptions from our practice. Safely store a copy of your signed Agreement. Violations to the Agreement will result in no further prescriptions. (Additional copies of our Medication Agreement are available upon request.) Laws, Rules, & Regulations: All patients are expected to follow all [address] and [employer], Rules, [place] [employer]. Ignorance of the Laws does not constitute a valid excuse.  Illegal drugs and Controlled Substances: The use of illegal substances (including, but not limited to marijuana and its derivatives) and/or the illegal use of any controlled substances is strictly prohibited. Violation of this rule may result in the immediate and permanent discontinuation of any and all prescriptions being written by our practice. The use of any illegal substances is prohibited. Adopted CDC guidelines & recommendations: Target dosing levels will be at or below 60 MME/day. Use of benzodiazepines** is not recommended. Urine Drug testing: Patients taking controlled substances will be required to provide a urine sample upon request. Do not void before coming to your medication management appointments. Hold emptying your bladder until you are admitted. The admitting nurse will inform you if a sample is required. Our practice reserves the right to call you at any time to provide a sample. Once receiving the call, you have 24 hours to comply with request. Not providing a sample upon request may result in termination of medication therapy.  Exceptions: There are only two exceptions to the rule of not receiving pain medications from other Healthcare Providers. Exception #1 (Emergencies): In the event of an emergency (i.e.: accident requiring emergency care), you are allowed to receive additional pain medication. However, you are  responsible for: As soon as you are able, call our office [phone], at any time of the day or night, and leave a message stating your name, the date and nature of the emergency, and the name and dose of the medication prescribed. In the event that your call is answered by a member of our staff, make sure to document and save the date, time, and the name of the person that took your information.  Exception #2 (Planned Surgery): In the event that you are scheduled by another doctor or dentist to have any type of surgery or procedure, you are allowed (for a period no longer than 30 days), to receive additional pain medication, for the acute post-op pain. However, in this case, you are responsible for picking up a copy of our Post-op Pain Management for Surgeons handout, and giving it to your surgeon or dentist. This document is available at our office, and does not require an appointment to obtain it. Simply go to our office during business hours (Monday-Thursday from 8:00 AM to 4:00 PM) (Friday 8:00 AM to 12:00 Noon) or if you have a scheduled appointment with us , prior to your  surgery, and ask for it by name. In addition, you are responsible for: calling our office [phone], at any time of the day or night, and leaving a message stating your name, name of your surgeon, type of surgery, and date of procedure or surgery. Failure to comply with your responsibilities may result in termination of therapy involving the controlled substances.  Consequences:  Non-compliance with the above rules may result in permanent discontinuation of medication prescription therapy. All patients receiving any type of controlled substance is expected to comply with the above patient responsibilities. Not doing so may result in permanent discontinuation of medication prescription therapy. Medication Agreement Violation. Following the above rules, including your responsibilities will help you in avoiding a Medication  Agreement Violation (Breaking your Pain Medication Contract).  *Opioid medications include: morphine , codeine, oxycodone , oxymorphone, hydrocodone, hydromorphone , meperidine, tramadol, tapentadol, buprenorphine, fentanyl , methadone. **Benzodiazepine medications include: diazepam (Valium), alprazolam (Xanax), clonazepam (Klonopine), lorazepam (Ativan), clorazepate (Tranxene), chlordiazepoxide (Librium), estazolam (Prosom), oxazepam (Serax), temazepam (Restoril), triazolam (Halcion) (Last updated: 02/18/2023) ______________________________________________________________________     ______________________________________________________________________    Medication Recommendations and Reminders  Applies to: All patients receiving prescriptions (written and/or electronic).  Medication Rules & Regulations: You are responsible for reading, knowing, and following our Medication Rules document. These exist for your safety and that of others. They are not flexible and neither are we. Dismissing or ignoring them is an act of non-compliance that may result in complete and irreversible termination of such medication therapy. For safety reasons, non-compliance will not be tolerated. As with the U.S[place] fundamental legal principle of ignorance of the law is no defense, we will accept no excuses for not having read and knowing the content of documents provided to you by our practice.  Pharmacy of record:  Definition: This is the pharmacy where your electronic prescriptions will be sent.  We do not endorse any particular pharmacy. It is up to you and your insurance to decide what pharmacy to use.  We do not restrict you in your choice of pharmacy. However, once we write for your prescriptions, we will NOT be re-sending more prescriptions to fix restricted supply problems created by your pharmacy, or your insurance.  The pharmacy listed in the electronic medical record should be the one where you want  electronic prescriptions to be sent. If you choose to change pharmacy, simply notify our nursing staff. Changes will be made only during your regular appointments and not over the phone.  Recommendations: Keep all of your pain medications in a safe place, under lock and key, even if you live alone. We will NOT replace lost, stolen, or damaged medication. We do not accept Police Reports as proof of medications having been stolen. After you fill your prescription, take 1 week's worth of pills and put them away in a safe place. You should keep a separate, properly labeled bottle for this purpose. The remainder should be kept in the original bottle. Use this as your primary supply, until it runs out. Once it's gone, then you know that you have 1 week's worth of medicine, and it is time to come in for a prescription refill. If you do this correctly, it is unlikely that you will ever run out of medicine. To make sure that the above recommendation works, it is very important that you make sure your medication refill appointments are scheduled at least 1 week before you run out of medicine. To do this in an effective manner, make sure that you do not leave the  office without scheduling your next medication management appointment. Always ask the nursing staff to show you in your prescription , when your medication will be running out. Then arrange for the receptionist to get you a return appointment, at least 7 days before you run out of medicine. Do not wait until you have 1 or 2 pills left, to come in. This is very poor planning and does not take into consideration that we may need to cancel appointments due to bad weather, sickness, or emergencies affecting our staff. DO NOT ACCEPT A Partial Fill: If for any reason your pharmacy does not have enough pills/tablets to completely fill or refill your prescription, do not allow for a partial fill. The law allows the pharmacy to complete that prescription within 72  hours, without requiring a new prescription. If they do not fill the rest of your prescription within those 72 hours, you will need a separate prescription to fill the remaining amount, which we will NOT provide. If the reason for the partial fill is your insurance, you will need to talk to the pharmacist about payment alternatives for the remaining tablets, but again, DO NOT ACCEPT A PARTIAL FILL, unless you can trust your pharmacist to obtain the remainder of the pills within 72 hours.  Prescription refills and/or changes in medication(s):  Prescription refills, and/or changes in dose or medication, will be conducted only during scheduled medication management appointments. (Applies to both, written and electronic prescriptions.) No refills on procedure days. No medication will be changed or started on procedure days. No changes, adjustments, and/or refills will be conducted on a procedure day. Doing so will interfere with the diagnostic portion of the procedure. No phone refills. No medications will be called into the pharmacy. No Fax refills. No weekend refills. No Holliday refills. No after hours refills.  Remember:  Business hours are:  Monday to Thursday 8:00 AM to 4:00 PM Provider's Schedule: Eric Como, MD - Appointments are:  Medication management: Monday and Wednesday 8:00 AM to 4:00 PM Procedure day: Tuesday and Thursday 7:30 AM to 4:00 PM Wallie Sherry, MD - Appointments are:  Medication management: Tuesday and Thursday 8:00 AM to 4:00 PM Procedure day: Monday and Wednesday 7:30 AM to 4:00 PM (Last update: 02/18/2022) ______________________________________________________________________     ______________________________________________________________________    National Pain Medication Shortage  The [place] is experiencing worsening drug shortages. These have had a negative widespread effect on patient care and treatment. Not expected to improve any time soon.  Predicted to last past 2029.   Drug shortage list (generic names) Oxycodone  IR Oxycodone /APAP Oxymorphone IR Hydromorphone  Hydrocodone/APAP Morphine   Where is the problem?  Manufacturing and supply level.  Will this shortage affect you?  Only if you take any of the above pain medications.  How? You may be unable to fill your prescription.  Your pharmacist may offer a partial fill of your prescription. (Warning: Do not accept partial fills.) Prescriptions partially filled cannot be transferred to another pharmacy. Read our Medication Rules and Regulation. Depending on how much medicine you are dependent on, you may experience withdrawals when unable to get the medication.  Recommendations: Consider ending your dependence on opioid pain medications. Ask your pain specialist to assist you with the process. Consider switching to a medication currently not in shortage, such as Buprenorphine. Talk to your pain specialist about this option. Consider decreasing your pain medication requirements by managing tolerance thru Drug Holidays. This may help minimize withdrawals, should you run out of medicine. Control your  pain thru the use of non-pharmacological interventional therapies.   Your prescriber: Prescribers cannot be blamed for shortages. Medication manufacturing and supply issues cannot be fixed by the prescriber.   NOTE: The prescriber is not responsible for supplying the medication, or solving supply issues. Work with your pharmacist to solve it. The patient is responsible for the decision to take or continue taking the medication and for identifying and securing a legal supply source. By law, supplying the medication is the job and responsibility of the pharmacy. The prescriber is responsible for the evaluation, monitoring, and prescribing of these medications.   Prescribers will NOT: Re-issue prescriptions that have been partially filled. Re-issue prescriptions already sent to  a pharmacy.  Re-send prescriptions to a different pharmacy because yours did not have your medication. Ask pharmacist to order more medicine or transfer the prescription to another pharmacy. (Read below.)  New 2023 regulation: December 27, 2021 Revised Regulation Allows DEA-Registered Pharmacies to Transfer Electronic Prescriptions at a Patients Request DEA Headquarters Division - Public Information Office Patients now have the ability to request their electronic prescription be transferred to another pharmacy without having to go back to their practitioner to initiate the request. This revised regulation went into effect on Monday, December 23, 2021.     At a patients request, a DEA-registered retail pharmacy can now transfer an electronic prescription for a controlled substance (schedules II-V) to another DEA-registered retail pharmacy. Prior to this change, patients would have to go through their practitioner to cancel their prescription and have it re-issued to a different pharmacy. The process was taxing and time consuming for both patients and practitioners.    The Drug Enforcement Administration [facility]) published its intent to revise the process for transferring electronic prescriptions on March 16, 2020.  The final rule was published in the federal register on November 21, 2021 and went into effect 30 days later.  Under the final rule, a prescription can only be transferred once between pharmacies, and only if allowed under existing state or other applicable law. The prescription must remain in its electronic form; may not be altered in any way; and the transfer must be communicated directly between two licensed pharmacists. Its important to note, any authorized refills transfer with the original prescription, which means the entire prescription will be filled at the same pharmacy.   Reference: hugehand.is [facility] website announcement)  Cheapwipes.at.pdf [employer])   [employer] / Vol. 88, No. 143 / Thursday, November 21, 2021 / Rules and Regulations DEPARTMENT OF JUSTICE  Drug Enforcement Administration  21 CFR Part 1306  [Docket No. [id number]]  RIN [id number] Transfer of Electronic Prescriptions for Schedules II-V Controlled Substances Between Pharmacies for Initial Filling  ______________________________________________________________________       ______________________________________________________________________    Transfer of Pain Medication between Pharmacies  Re: 2023 DEA Clarification on existing regulation  Published on DEA Website: December 27, 2021  Title: Revised Regulation Allows DEA-Registered Pharmacies to Electrical Engineer Prescriptions at a Patients Request DEA Headquarters Division - [employer]  Patients now have the ability to request their electronic prescription be transferred to another pharmacy without having to go back to their practitioner to initiate the request. This revised regulation went into effect on Monday, December 23, 2021.     At a patients request, a DEA-registered retail pharmacy can now transfer an electronic prescription for a controlled substance (schedules II-V) to another DEA-registered retail pharmacy. Prior to this change, patients would have to go through their  practitioner to cancel their prescription and have it re-issued to a different pharmacy. The process was taxing and time consuming for both patients and practitioners.    The Drug Enforcement Administration [facility]) published its intent to revise the process for transferring electronic prescriptions on March 16, 2020.  The final rule was published in the  federal register on November 21, 2021 and went into effect 30 days later.  Under the final rule, a prescription can only be transferred once between pharmacies, and only if allowed under existing state or other applicable law. The prescription must remain in its electronic form; may not be altered in any way; and the transfer must be communicated directly between two licensed pharmacists. Its important to note, any authorized refills transfer with the original prescription, which means the entire prescription will be filled at the same pharmacy.    REFERENCES: 1. DEA website announcement hugehand.is  2. Department of Justice website  Cheapwipes.at.pdf  3. DEPARTMENT OF JUSTICE Drug Enforcement Administration 21 CFR Part 1306 [Docket No. [id number]] RIN [id number] Transfer of Electronic Prescriptions for Schedules II-V Controlled Substances Between Pharmacies for Initial Filling  ______________________________________________________________________       ______________________________________________________________________    Medication Transfer   Notification You are currently compliant and stable on your pain medication regimen. This regimen will be transferred today to your Primary Care Provider (PCP). You will be provided with enough prescriptions to last for 90 days. After that, your prescriptions will need to be taken over by your PCP.  Recommendation Immediately contact your primary care provider to secure an appointment for evaluation before this period is over. Do not wait until the last month to contact them.   Clarification The transfer of your medication regimen does not mean that you are being discharged from our clinic. We will remain available to you for any consultation or interventional therapies you may need.    Alternative Should you decide not to continue taking these medication and would like assistance in permanently stopping them, please let us  know so that we can design a slow tapering down of your regimen.  Reason Our primary responsibility to provide specialized interventional pain management therapies otherwise not available to the community. We have in the past assisted primary care providers with reviewing and adjusting pain medication management therapies, however, we have been transparent to all patients and referring providers that it is not our intention to permanently take over this type of therapy. Transfer of this portion of your care will assist us  in freeing time to assist others in need of our specialty services.   ______________________________________________________________________      ______________________________________________________________________    WARNING: CBD (cannabidiol) & Delta (Delta-8 tetrahydrocannabinol) products.   Applicable to:  All individuals currently taking or considering taking CBD (cannabidiol) and, more important, all patients taking opioid analgesic controlled substances (pain medication). (Example: oxycodone ; oxymorphone; hydrocodone; hydromorphone ; morphine ; methadone; tramadol; tapentadol; fentanyl ; buprenorphine; butorphanol; dextromethorphan; meperidine; codeine; etc.)  Introduction:  Recently there has been a drive towards the use of natural products for the treatment of different conditions, including pain anxiety and sleep disorders. Marijuana and hemp are two varieties of the cannabis genus plants. Marijuana and its derivatives are illegal, while hemp and its derivatives are not. Cannabidiol (CBD) and tetrahydrocannabinol (THC), are two natural compounds found in plants of the Cannabis genus. They can both be extracted from hemp or marijuana. Both compounds interact with your bodys endocannabinoid system in very different ways. CBD is  associated with pain relief (analgesia) while  THC is associated with the psychoactive effects (the high) obtained from the use of marijuana products. There are two main types of THC: Delta-9, which comes from the marijuana plant and it is illegal, and Delta-8, which comes from the hemp plant, and it is legal. (Both, Delta-9-THC and Delta-8-THC are psychoactive and give you the high.)   Legality:  Marijuana and its derivatives: illegal Hemp and its derivatives: Legal (State dependent) UPDATE: (06/14/2021) The Drug Enforcement Agency (DEA) issued a letter stating that delta cannabinoids, including Delta-8-THCO and Delta-9-THCO, synthetically derived from hemp do not qualify as hemp and will be viewed as Schedule I drugs. (Schedule I drugs, substances, or chemicals are defined as drugs with no currently accepted medical use and a high potential for abuse. Some examples of Schedule I drugs are: heroin, lysergic acid diethylamide (LSD), marijuana (cannabis), 3,4-methylenedioxymethamphetamine (ecstasy), methaqualone, and peyote.) (cuetune.com.ee)  Legal status of CBD in [place]:  Conditionally Legal  Reference: FDA Regulation of Cannabis and Cannabis-Derived Products, Including Cannabidiol (CBD) - oemdeals.dk  Warning:  CBD is not FDA approved and has not undergo the same manufacturing controls as prescription drugs.  This means that the purity and safety of available CBD may be questionable. Most of the time, despite manufacturer's claims, it is contaminated with THC (delta-9-tetrahydrocannabinol - the chemical in marijuana responsible for the HIGH).  When this is the case, the [facility] contaminant will trigger a positive urine drug screen (UDS) test for Marijuana (carboxy-THC).   The FDA recently put out a warning about 5 things that everyone should be aware of regarding Delta-8  THC: Delta-8 THC products have not been evaluated or approved by the FDA for safe use and may be marketed in ways that put the public health at risk. The FDA has received adverse event reports involving delta-8 THC-containing products. Delta-8 THC has psychoactive and intoxicating effects. Delta-8 THC manufacturing often involve use of potentially harmful chemicals to create the concentrations of delta-8 THC claimed in the marketplace. The final delta-8 THC product may have potentially harmful by-products (contaminants) due to the chemicals used in the process. Manufacturing of delta-8 THC products may occur in uncontrolled or unsanitary settings, which may lead to the presence of unsafe contaminants or other potentially harmful substances. Delta-8 THC products should be kept out of the reach of children and pets.  NOTE: Because a positive UDS for any illicit substance is a violation of our medication agreement, your opioid analgesics (pain medicine) may be permanently discontinued.  MORE ABOUT CBD  General Information: CBD was discovered in 96 and it is a derivative of the cannabis sativa genus plants (Marijuana and Hemp). It is one of the 113 identified substances found in Marijuana. It accounts for up to 40% of the plant's extract. As of 2018, preliminary clinical studies on CBD included research for the treatment of anxiety, movement disorders, and pain. CBD is available and consumed in multiple forms, including inhalation of smoke or vapor, as an aerosol spray, and by mouth. It may be supplied as an oil containing CBD, capsules, dried cannabis, or as a liquid solution. CBD is thought not to be as psychoactive as THC (delta-9-tetrahydrocannabinol - the chemical in marijuana responsible for the HIGH). Studies suggest that CBD may interact with different biological target receptors in the body, including cannabinoid and other neurotransmitter receptors. As of 2018 the mechanism of action for its  biological effects has not been determined.  Side-effects  Adverse reactions: Dry mouth, diarrhea, decreased appetite, fatigue, drowsiness, malaise, weakness, sleep disturbances,  and others.  Drug interactions:  CBD may interact with medications such as blood-thinners. CBD causes drowsiness on its own and it will increase drowsiness caused by other medications, including antihistamines (such as Benadryl), benzodiazepines (Xanax, Ativan, Valium), antipsychotics, antidepressants, opioids, alcohol and supplements such as kava, melatonin and St. John's Wort.  Other drug interactions: Brivaracetam (Briviact); Caffeine; Carbamazepine (Tegretol); Citalopram  (Celexa ); Clobazam (Onfi); Eslicarbazepine (Aptiom); Everolimus (Zostress); Lithium; Methadone (Dolophine); Rufinamide (Banzel); Sedative medications (CNS depressants); Sirolimus (Rapamune); Stiripentol (Diacomit); Tacrolimus (Prograf); Tamoxifen ; Soltamox); Topiramate (Topamax); Valproate; Warfarin (Coumadin); Zonisamide. (Last update: 04/07/2022) ______________________________________________________________________     ______________________________________________________________________    Muscle Spasms & Cramps  Cause(s):  Most common - vitamin and/or electrolyte (calcium, potassium, sodium, etc.) deficiencies. Post procedure - steroids (injected, oral, or inhaled) can make your kidneys excrete (loose) electrolytes. Most of the time this will not cause any symptoms however, if you happen to be borderline low on your electrolytes, it may temporarily triggering cramps & spasms.  Possible triggers: Sweating - causes loss of electrolytes thru the skin. Steroids - causes loss of electrolytes thru the urine.  Treatment: (over-the-counter)  Gatorade (or any other electrolyte-replenishing drink) - Take 1, 8 oz glass with each meal (3 times a day). Mechanism of action: Replenishes lost electrolytes. Magnesium 400 to 500 mg - Take 1 tablet twice a  day (one with breakfast and one at bedtime). If you have kidney disease talk to your primary care physician before taking any Magnesium. Mechanism of action: Magnesium is a natural muscle relaxant. Tonic Water with quinine - Take 1, 8 oz glass before bedtime.  Mechanism of action: Quinine is used to treat spasms.  Last Update: 11/06/2022  ______________________________________________________________________     ______________________________________________________________________    Procedure instructions  Stop blood-thinners  Do not eat or drink fluids (other than water) for 8 hours before your procedure  No water for 2 hours before your procedure  Take your blood pressure medicine with a sip of water  Arrive 30 minutes before your appointment  If sedation is planned, bring suitable driver. [place], [place], & public transportation are NOT APPROVED)  Carefully read the Preparing for your procedure detailed instructions  If you have questions call us  at [phone]  Procedure appointments are for procedures only.   NO medication refills or new problem evaluations will be done on procedure days.   Only the scheduled, pre-approved procedure and side will be done.   ______________________________________________________________________     ______________________________________________________________________    Preparing for your procedure  Appointments: If you think you may not be able to keep your appointment, call 24-48 hours in advance to cancel. We need time to make it available to others.  Procedure visits are for procedures only. During your procedure appointment there will be: NO Prescription Refills*. NO medication changes or discussions*. NO discussion of disability issues*. NO unrelated pain problem evaluations*. NO evaluations to order other pain procedures*. *These will be addressed at a separate and distinct evaluation encounter on the provider's  evaluation schedule and not during procedure days.  Instructions: Food intake: Avoid eating anything solid for at least 8 hours prior to your procedure. Clear liquid intake: You may take clear liquids such as water up to 2 hours prior to your procedure. (No carbonated drinks. No soda.) Transportation: Unless otherwise stated by your physician, bring a driver. (Driver cannot be a Market Researcher, Pharmacist, Community, or any other form of public transportation.) Morning Medicines: Except for blood thinners, take all of your other morning medications with a sip  of water. Make sure to take your heart and blood pressure medicines. If your blood pressure's lower number is above 100, the case will be rescheduled. Blood thinners: Make sure to stop your blood thinners as instructed.  If you take a blood thinner, but were not instructed to stop it, call our office [phone] and ask to talk to a nurse. Not stopping a blood thinner prior to certain procedures could lead to serious complications. Diabetics on insulin: Notify the staff so that you can be scheduled 1st case in the morning. If your diabetes requires high dose insulin, take only  of your normal insulin dose the morning of the procedure and notify the staff that you have done so. Preventing infections: Shower with an antibacterial soap the morning of your procedure.  Build-up your immune system: Take 1000 mg of Vitamin C with every meal (3 times a day) the day prior to your procedure. Antibiotics: Inform the nursing staff if you are taking any antibiotics or if you have any conditions that may require antibiotics prior to procedures. (Example: recent joint implants)   Pregnancy: If you are pregnant make sure to notify the nursing staff. Not doing so may result in injury to the fetus, including death.  Sickness: If you have a cold, fever, or any active infections, call and cancel or reschedule your procedure. Receiving steroids while having an infection may result in  complications. Arrival: You must be in the facility at least 30 minutes prior to your scheduled procedure. Tardiness: Your scheduled time is also the cutoff time. If you do not arrive at least 15 minutes prior to your procedure, you will be rescheduled.  Children: Do not bring any children with you. Make arrangements to keep them home. Dress appropriately: There is always a possibility that your clothing may get soiled. Avoid long dresses. Valuables: Do not bring any jewelry or valuables.  Reasons to call and reschedule or cancel your procedure: (Following these recommendations will minimize the risk of a serious complication.) Surgeries: Avoid having procedures within 2 weeks of any surgery. (Avoid for 2 weeks before or after any surgery). Flu Shots: Avoid having procedures within 2 weeks of a flu shots or . (Avoid for 2 weeks before or after immunizations). Barium: Avoid having a procedure within 7-10 days after having had a radiological study involving the use of radiological contrast. (Myelograms, Barium swallow or enema study). Heart attacks: Avoid any elective procedures or surgeries for the initial 6 months after a Myocardial Infarction (Heart Attack). Blood thinners: It is imperative that you stop these medications before procedures. Let us  know if you if you take any blood thinner.  Infection: Avoid procedures during or within two weeks of an infection (including chest colds or gastrointestinal problems). Symptoms associated with infections include: Localized redness, fever, chills, night sweats or profuse sweating, burning sensation when voiding, cough, congestion, stuffiness, runny nose, sore throat, diarrhea, nausea, vomiting, cold or Flu symptoms, recent or current infections. It is specially important if the infection is over the area that we intend to treat. Heart and lung problems: Symptoms that may suggest an active cardiopulmonary problem include: cough, chest pain, breathing  difficulties or shortness of breath, dizziness, ankle swelling, uncontrolled high or unusually low blood pressure, and/or palpitations. If you are experiencing any of these symptoms, cancel your procedure and contact your primary care physician for an evaluation.  Remember:  Regular Business hours are:  Monday to Thursday 8:00 AM to 4:00 PM  Provider's Schedule: Eric Como,  MD:  Procedure days: Tuesday and Thursday 7:30 AM to 4:00 PM  Wallie Sherry, MD:  Procedure days: Monday and Wednesday 7:30 AM to 4:00 PM Last  Updated: 04/07/2023 ______________________________________________________________________     "

## 2024-05-04 NOTE — Progress Notes (Signed)
 Nursing Pain Medication Assessment:  Safety precautions to be maintained throughout the outpatient stay will include: orient to surroundings, keep bed in low position, maintain call bell within reach at all times, provide assistance with transfer out of bed and ambulation.  Medication Inspection Compliance: Pill count conducted under aseptic conditions, in front of the patient. Neither the pills nor the bottle was removed from the patient's sight at any time. Once count was completed pills were immediately returned to the patient in their original bottle.  Medication: Levorphanol 2mg  Pill/Patch Count: 20 of 90 pills/patches remain Pill/Patch Appearance: Markings consistent with prescribed medication Bottle Appearance: Standard pharmacy container. Clearly labeled. Filled Date: 22 / 13 / 2025 Last Medication intake:  Today

## 2024-05-23 ENCOUNTER — Ambulatory Visit: Admitting: Student in an Organized Health Care Education/Training Program

## 2024-06-01 ENCOUNTER — Encounter: Admitting: Nurse Practitioner

## 2024-06-03 ENCOUNTER — Institutional Professional Consult (permissible substitution): Admitting: Plastic Surgery

## 2024-06-06 ENCOUNTER — Ambulatory Visit: Admitting: Professional Counselor

## 2024-06-28 ENCOUNTER — Encounter: Admitting: Nurse Practitioner
